# Patient Record
Sex: Female | Born: 1940
Health system: Southern US, Community
[De-identification: ages and names within clinical notes are randomized; demographics above are authoritative.]

## PROBLEM LIST (undated history)

## (undated) DIAGNOSIS — K5730 Diverticulosis of large intestine without perforation or abscess without bleeding: Secondary | ICD-10-CM

## (undated) DIAGNOSIS — E785 Hyperlipidemia, unspecified: Secondary | ICD-10-CM

## (undated) DIAGNOSIS — I714 Abdominal aortic aneurysm, without rupture, unspecified: Secondary | ICD-10-CM

## (undated) DIAGNOSIS — Z8551 Personal history of malignant neoplasm of bladder: Secondary | ICD-10-CM

## (undated) DIAGNOSIS — I10 Essential (primary) hypertension: Secondary | ICD-10-CM

## (undated) DIAGNOSIS — I2510 Atherosclerotic heart disease of native coronary artery without angina pectoris: Secondary | ICD-10-CM

## (undated) DIAGNOSIS — R351 Nocturia: Secondary | ICD-10-CM

## (undated) DIAGNOSIS — C801 Malignant (primary) neoplasm, unspecified: Secondary | ICD-10-CM

## (undated) DIAGNOSIS — M8580 Other specified disorders of bone density and structure, unspecified site: Secondary | ICD-10-CM

## (undated) DIAGNOSIS — M1990 Unspecified osteoarthritis, unspecified site: Secondary | ICD-10-CM

## (undated) HISTORY — PX: CHOLECYSTECTOMY: SHX55

## (undated) HISTORY — DX: Essential (primary) hypertension: I10

## (undated) HISTORY — DX: Atherosclerotic heart disease of native coronary artery without angina pectoris: I25.10

## (undated) HISTORY — DX: Malignant (primary) neoplasm, unspecified: C80.1

## (undated) HISTORY — DX: Other specified disorders of bone density and structure, unspecified site: M85.80

## (undated) HISTORY — DX: Hyperlipidemia, unspecified: E78.5

## (undated) HISTORY — PX: UMBILICAL HERNIA REPAIR: SHX196

---

## 1991-03-11 DIAGNOSIS — I10 Essential (primary) hypertension: Secondary | ICD-10-CM

## 1991-03-11 DIAGNOSIS — E785 Hyperlipidemia, unspecified: Secondary | ICD-10-CM

## 1991-03-11 HISTORY — DX: Hyperlipidemia, unspecified: E78.5

## 1991-03-11 HISTORY — PX: CORONARY ANGIOPLASTY: SHX604

## 1991-03-11 HISTORY — DX: Essential (primary) hypertension: I10

## 1998-04-15 ENCOUNTER — Encounter: Payer: Self-pay | Admitting: Surgery

## 1998-04-15 ENCOUNTER — Inpatient Hospital Stay (HOSPITAL_COMMUNITY): Admission: EM | Admit: 1998-04-15 | Discharge: 1998-04-17 | Payer: Self-pay | Admitting: Emergency Medicine

## 1998-04-16 ENCOUNTER — Encounter: Payer: Self-pay | Admitting: General Surgery

## 1998-09-28 ENCOUNTER — Other Ambulatory Visit: Admission: RE | Admit: 1998-09-28 | Discharge: 1998-09-28 | Payer: Self-pay | Admitting: Family Medicine

## 1999-10-09 ENCOUNTER — Other Ambulatory Visit: Admission: RE | Admit: 1999-10-09 | Discharge: 1999-10-09 | Payer: Self-pay | Admitting: Family Medicine

## 2000-01-07 ENCOUNTER — Encounter: Admission: RE | Admit: 2000-01-07 | Discharge: 2000-01-07 | Payer: Self-pay | Admitting: Family Medicine

## 2000-01-07 ENCOUNTER — Encounter: Payer: Self-pay | Admitting: Family Medicine

## 2000-10-14 ENCOUNTER — Other Ambulatory Visit: Admission: RE | Admit: 2000-10-14 | Discharge: 2000-10-14 | Payer: Self-pay | Admitting: Family Medicine

## 2001-10-13 ENCOUNTER — Encounter: Payer: Self-pay | Admitting: Family Medicine

## 2001-10-13 ENCOUNTER — Encounter: Admission: RE | Admit: 2001-10-13 | Discharge: 2001-10-13 | Payer: Self-pay | Admitting: Family Medicine

## 2002-01-14 ENCOUNTER — Other Ambulatory Visit: Admission: RE | Admit: 2002-01-14 | Discharge: 2002-01-14 | Payer: Self-pay | Admitting: Family Medicine

## 2002-12-13 ENCOUNTER — Encounter: Payer: Self-pay | Admitting: Family Medicine

## 2002-12-13 ENCOUNTER — Encounter: Admission: RE | Admit: 2002-12-13 | Discharge: 2002-12-13 | Payer: Self-pay | Admitting: Family Medicine

## 2003-03-17 ENCOUNTER — Other Ambulatory Visit: Admission: RE | Admit: 2003-03-17 | Discharge: 2003-03-17 | Payer: Self-pay | Admitting: Family Medicine

## 2003-05-31 ENCOUNTER — Ambulatory Visit (HOSPITAL_COMMUNITY): Admission: RE | Admit: 2003-05-31 | Discharge: 2003-05-31 | Payer: Self-pay | Admitting: Gastroenterology

## 2003-05-31 HISTORY — PX: COLONOSCOPY: SHX174

## 2004-04-23 ENCOUNTER — Encounter: Admission: RE | Admit: 2004-04-23 | Discharge: 2004-04-23 | Payer: Self-pay | Admitting: Family Medicine

## 2005-05-08 ENCOUNTER — Encounter: Admission: RE | Admit: 2005-05-08 | Discharge: 2005-05-08 | Payer: Self-pay | Admitting: Family Medicine

## 2005-09-12 ENCOUNTER — Other Ambulatory Visit: Admission: RE | Admit: 2005-09-12 | Discharge: 2005-09-12 | Payer: Self-pay | Admitting: Family Medicine

## 2006-06-03 ENCOUNTER — Encounter: Admission: RE | Admit: 2006-06-03 | Discharge: 2006-06-03 | Payer: Self-pay | Admitting: Family Medicine

## 2007-06-22 ENCOUNTER — Encounter: Admission: RE | Admit: 2007-06-22 | Discharge: 2007-06-22 | Payer: Self-pay | Admitting: Family Medicine

## 2008-05-10 ENCOUNTER — Encounter: Payer: Self-pay | Admitting: Cardiology

## 2008-08-03 ENCOUNTER — Encounter: Admission: RE | Admit: 2008-08-03 | Discharge: 2008-08-03 | Payer: Self-pay | Admitting: Family Medicine

## 2008-08-23 ENCOUNTER — Encounter: Payer: Self-pay | Admitting: Cardiology

## 2008-11-15 ENCOUNTER — Encounter: Payer: Self-pay | Admitting: Cardiology

## 2008-11-28 ENCOUNTER — Ambulatory Visit: Payer: Self-pay | Admitting: Cardiology

## 2008-11-28 DIAGNOSIS — E785 Hyperlipidemia, unspecified: Secondary | ICD-10-CM

## 2008-11-28 DIAGNOSIS — I2510 Atherosclerotic heart disease of native coronary artery without angina pectoris: Secondary | ICD-10-CM | POA: Insufficient documentation

## 2008-11-28 DIAGNOSIS — I10 Essential (primary) hypertension: Secondary | ICD-10-CM | POA: Insufficient documentation

## 2008-11-28 HISTORY — DX: Hyperlipidemia, unspecified: E78.5

## 2008-11-28 HISTORY — DX: Atherosclerotic heart disease of native coronary artery without angina pectoris: I25.10

## 2009-09-11 ENCOUNTER — Encounter: Admission: RE | Admit: 2009-09-11 | Discharge: 2009-09-11 | Payer: Self-pay | Admitting: Family Medicine

## 2010-01-02 ENCOUNTER — Ambulatory Visit: Payer: Self-pay | Admitting: Cardiology

## 2010-02-25 ENCOUNTER — Encounter: Payer: Self-pay | Admitting: Cardiology

## 2010-04-09 NOTE — Assessment & Plan Note (Signed)
Summary: [facility]   Visit Type:  Follow-up Primary Provider:  Dr. Vernon Prey  CC:  CAD/HTN.  History of Present Illness: The patient returns for followup of the above. Since I last saw her she says that her blood pressure does fluctuate somewhat. She reports a going from the 130s to the 150 range systolic. She does not describe any symptoms associated with this. She tries to be active golfing and doing some housework as well as pedaling a stationary bicycle. With this she does not have any chest discomfort, neck or arm discomfort. She does not report any palpitations, presyncope or syncope. She has no shortness of breath, PND or orthopnea. I did review recent lipids and her LDL is elevated but she has only tolerated a very low dose statin.  Current Medications (verified): 1)  Metoprolol Tartrate 25 Mg Tabs (Metoprolol Tartrate) .... One Twice A Day 2)  Vitamin D 2000 Unit Tabs (Cholecalciferol) .... Daily 3)  Co Q-10 30 Mg  Caps (Coenzyme Q10) .... Daily 4)  Pravachol 10 Mg Tabs (Pravastatin Sodium) .... Out 5)  Multivitamins   Tabs (Multiple Vitamin) .[name].. 1 By Mouth Daily  Allergies (verified): 1)  ! Codeine  Past History:  Past Medical History: HTN since 1993 Hyperlipidemia since 1993 CAD (1993 PTCA. She describes a near total blockage of apparently the LAD and a 65% stenosis elsewhere.)  Review of Systems       As stated in the HPI and negative for all other systems.   Vital Signs:  Patient profile:   70 year old female Height:      64 inches Weight:      168 pounds BMI:     28.94 Pulse rate:   71 / minute Resp:     18 per minute BP sitting:   162 / 88  (right arm)  Vitals Entered By: Marrion Coy, CNA (January 02, 2010 9:30 AM)  Physical Exam  General:  Well developed, well nourished, in no acute distress. Head:  normocephalic and atraumatic Neck:  Neck supple, no JVD. No masses, thyromegaly or abnormal cervical nodes. Chest Wall:  no deformities or  breast masses noted Lungs:  Clear bilaterally to auscultation and percussion. Abdomen:  Bowel sounds positive; abdomen soft and non-tender without masses, organomegaly, or hernias noted. No hepatosplenomegaly. Msk:  Back normal, normal gait. Muscle strength and tone normal. Extremities:  No clubbing or cyanosis. Neurologic:  Alert and oriented x 3. Skin:  Intact without lesions or rashes. Cervical Nodes:  no significant adenopathy Inguinal Nodes:  no significant adenopathy Psych:  Normal affect.   Detailed Cardiovascular Exam  Neck    Carotids: Carotids full and equal bilaterally without bruits.      Neck Veins: Normal, no JVD.    Heart    Inspection: no deformities or lifts noted.      Palpation: normal PMI with no thrills palpable.      Auscultation: regular rate and rhythm, S1, S2 without murmurs, rubs, gallops, or clicks.    Vascular    Abdominal Aorta: no palpable masses, pulsations, or audible bruits.      Femoral Pulses: normal femoral pulses bilaterally.      Pedal Pulses: normal pedal pulses bilaterally.      Radial Pulses: normal radial pulses bilaterally.      Peripheral Circulation: no clubbing, cyanosis, or edema noted with normal capillary refill.     EKG  Procedure date:  01/02/2010  Findings:      Sinus rhythm, rate  72, axis within normal limits, intervals within normal limits, no acute ST-T wave changes  Impression & Recommendations:  Problem # 1:  CORONARY ATHEROSCLEROSIS NATIVE CORONARY ARTERY (ICD-414.01) It has been many years since stress testing. I would like to try an exercise treadmill test.  She will also continue with risk reduction. Orders: EKG w/ Interpretation (93000)  Problem # 2:  DYSLIPIDEMIA (ICD-272.4) Unfortunately she has not tolerated higher dose statin dose she is not at all at target with her lipids. We sent a long time discussing dietary supplements and over-the-counter treatments as well as foods that could help with her  lipids.  Problem # 3:  ESSENTIAL HYPERTENSION, BENIGN (ICD-401.1) Her blood pressure is not well controlled. I will have her increase her metoprolol to 25 mg b.i.d. Orders: EKG w/ Interpretation (93000)  Patient Instructions: 1)  Your physician recommends that you schedule a follow-up appointment in: 12 months with Dr Antoine Poche in [place] 2)  Your physician has recommended you make the following change in your medication: Increase Metoprolol to 25 mg twice a day 3)  Your physician has requested that you have an exercise tolerance test. This should be scheduled at [facility].  For further information please visit [email or web address]  Please also follow instruction sheet, as given. Prescriptions: METOPROLOL TARTRATE 25 MG TABS (METOPROLOL TARTRATE) one twice a day  #60 x 11   Entered by:   Charolotte Capuchin, RN   Authorized by:   Rollene Rotunda, MD, [facility]   Signed by:   Charolotte Capuchin, RN on 01/02/2010   Method used:   Faxed to ...       [facility]ail[facility]       [address]       [place], [place]  [address]       Ph: [phone] or [phone]       Fax: [phone]   RxID:   [id number]

## 2010-04-09 NOTE — Letter (Signed)
Summary: [facility]                            [facility]                                     Imported By: Roderic Ovens 12/13/2008 14:57:18  _____________________________________________________________________  External Attachment:    Type:   Image     Comment:   External Document

## 2010-04-09 NOTE — Letter (Signed)
Summary: [facility]  [facility]   Imported By: Roderic Ovens 12/13/2008 15:01:09  _____________________________________________________________________  External Attachment:    Type:   Image     Comment:   External Document

## 2010-04-09 NOTE — Miscellaneous (Signed)
  Clinical Lists Changes  Medications: Changed medication from PRAVACHOL 10 MG TABS (PRAVASTATIN SODIUM) OUT to PRAVACHOL 10 MG TABS (PRAVASTATIN SODIUM) once daily at bedtime

## 2010-04-09 NOTE — Assessment & Plan Note (Signed)
Summary: np6/cad   Visit Type:  Follow-up Primary Provider:  Dr. Vernon Prey   History of Present Illness: The patient presents for evaluation of known coronary disease.  In 1993 she had dyspnea on exertion and apparently subsequently failed a stress perfusion study with evidence of ischemia. She then underwent PTCA. She describes a near total blockage of apparently the LAD and a 65% stenosis elsewhere. She was followed with stress test with the last one being about 7 years ago. However, she is no longer followed by a cardiologist.  Since that time she has done quite well. She exercises daily. She walks for exercise. With this she does not get chest discomfort, neck or arm discomfort. She does not have any of the shortness of breath which was her previous angina. She denies any resting shortness of breath such as PND or orthopnea. There is no report of palpitations, presyncope or syncope.  She has tried multiple statins in the past. She has tolerated none of them. She also reports whitecoat hypertension.  Current Medications (verified): 1)  Metoprolol Succinate 25 Mg Xr24h-Tab (Metoprolol Succinate) .... Dailiy 2)  Vitamin D 2000 Unit Tabs (Cholecalciferol) .... Daily 3)  Aspirin 81 Mg  Tabs (Aspirin) .... Daily 4)  Fish Oil   Oil (Fish Oil) .... 2 By Mouth Daily 5)  Co Q-10 30 Mg  Caps (Coenzyme Q10) .... Daily 6)  Pravachol 10 Mg Tabs (Pravastatin Sodium) .... One By Mouth At Bedtime  Allergies (verified): 1)  ! Codeine  Past History:  Past Medical History: HTN since 1993 Hyperlipidemia since 1993 CAD  Past Surgical History: Umbilical hernia repair Cholecystectomy  Family History: Her father had coronary disease starting in his late 98s. He died at age 18. Otherwise there is no early onset heart disease in first-degree relatives.  Social History: She smoked briefly years ago. She occasionally drinks alcohol. She is married with 3 children and 4 grandchildren.  Review of  Systems       As stated in the HPI and negative for all other systems.   Vital Signs:  Patient profile:   70 year old female Height:      64 inches Weight:      170 pounds BMI:     29.29 Pulse rate:   73 / minute Resp:     16 per minute BP sitting:   177 / 93  (right arm)  Vitals Entered By: Marrion Coy, CNA (November 28, 2008 9:47 AM)  Physical Exam  General:  Well developed, well nourished, in no acute distress. Head:  normocephalic and atraumatic Eyes:  PERRLA/EOM intact; conjunctiva and lids normal. Mouth:  Teeth, gums and palate normal. Oral mucosa normal. Neck:  Neck supple, no JVD. No masses, thyromegaly or abnormal cervical nodes. Chest Wall:  no deformities or breast masses noted Lungs:  Clear bilaterally to auscultation and percussion. Abdomen:  Bowel sounds positive; abdomen soft and non-tender without masses, organomegaly, or hernias noted. No hepatosplenomegaly. Msk:  Back normal, normal gait. Muscle strength and tone normal. Extremities:  No clubbing or cyanosis. Neurologic:  Alert and oriented x 3. Skin:  Intact without lesions or rashes. Cervical Nodes:  no significant adenopathy Axillary Nodes:  no significant adenopathy Inguinal Nodes:  no significant adenopathy Psych:  Normal affect.   Detailed Cardiovascular Exam  Neck    Carotids: Carotids full and equal bilaterally without bruits.      Neck Veins: Normal, no JVD.    Heart    Inspection: no deformities or  lifts noted.      Palpation: normal PMI with no thrills palpable.      Auscultation: regular rate and rhythm, S1, S2 without murmurs, rubs, gallops, or clicks.    Vascular    Abdominal Aorta: no palpable masses, pulsations, or audible bruits.      Femoral Pulses: normal femoral pulses bilaterally.      Pedal Pulses: normal pedal pulses bilaterally.      Radial Pulses: normal radial pulses bilaterally.      Peripheral Circulation: no clubbing, cyanosis, or edema noted with normal capillary  refill.     EKG  Procedure date:  11/28/2008  Findings:      Normal sinus rhythm, rate 73, axis within normal limits, and her vitals within normal limits, no acute ST-T wave changes.  Impression & Recommendations:  Problem # 1:  CORONARY ATHEROSCLEROSIS NATIVE CORONARY ARTERY (ICD-414.01) The patient has no ongoing symptoms. She is quite active exercising daily. At this point I would continue with risk reduction but would not suggest further cardiovascular testing. She does know that should she develop any symptoms that could be an anginal equivalent, we reviewed these, she would let me know because I would have a low threshold for stress testing going forward. Orders: EKG w/ Interpretation (93000)  Problem # 2:  DYSLIPIDEMIA (ICD-272.4) I do not have the report of her lipids but apparently this has been somewhat abnormal in the past. Given her coronary disease Dr. Christell Constant has appropriately tried to have her on a statin over the years. She does agree to try pravastatin again as it has been 15 years since she last attempted this. She'll let me know if she has any of the muscle aches or other symptoms that have been her complaint on these drugs. If she tolerates it she will get a fasting lipid profile in 8-10 weeks. Titration will be per Dr. Christell Constant.  Problem # 3:  ESSENTIAL HYPERTENSION, BENIGN (ICD-401.1) The patient's blood pressure was too elevated today. However, she reports that at home it is under 140/90 consistently. She reports whitecoat hypertension and I will believe this. We did clarify her beta blocker as she has started taking an immediate release preparation but was only taking it once daily.  Patient Instructions: 1)  Your physician recommends that you schedule a follow-up appointment in: one year 2)  Your physician has recommended you make the following change in your medication: start Pravastatin 10 mg at bedtime Prescriptions: PRAVACHOL 10 MG TABS (PRAVASTATIN SODIUM) one by  mouth at bedtime  #30 x 11   Entered by:   Charolotte Capuchin, RN   Authorized by:   Rollene Rotunda, MD, [facility]   Signed by:   Charolotte Capuchin, RN on 11/28/2008   Method used:   Faxed to ...       Hospital doctor (retail)       [address]       [place], [place]  [address]       Ph: [phone] or [phone]       Fax: [phone]   RxID:   [id number]

## 2010-07-02 ENCOUNTER — Encounter: Payer: Self-pay | Admitting: Cardiology

## 2010-07-26 NOTE — Op Note (Signed)
NAME:  Gabriela Smith, Gabriela Smith                          ACCOUNT NO.:  [id number]   MEDICAL RECORD NO.:  [id number]                   PATIENT TYPE:  AMB   LOCATION:  ENDO                                 FACILITY:  [facility]   PHYSICIAN:  John C. Madilyn Fireman, M.D.                 DATE OF BIRTH:  09-21-1940   DATE OF PROCEDURE:  05/31/2003  DATE OF DISCHARGE:                                 OPERATIVE REPORT   PROCEDURE:  Colonoscopy.   INDICATION FOR PROCEDURE:  Colon cancer screening.   DESCRIPTION OF PROCEDURE:  The patient was placed in the left lateral  decubitus position and placed on the pulse monitor with continuous low-flow  oxygen delivered by nasal cannula.  She was sedated with 125 mcg IV fentanyl  and 12 mg IV Versed.  The Olympus video colonoscope was inserted into the  rectum and advanced to approximately 80 cm, estimated to be somewhere in the  transverse colon.  There was some angulation and looping, and the patient  did not sedate very much at all despite 125 mcg IV fentanyl, 12 mg IV  Versed, and 25 mg IV fentanyl.  Even when not subject to pain stimuli, she  was not very sedated and did experience considerable discomfort if areas  there was angulation and increased resistance when trying to pass the scope  forward.  I could not easily overcome this and was reluctant to give her  more medication given that she was very minimally sedated at all despite the  above-mentioned amounts.  I decided at this point to limit the procedure to  the areas already reached, and it was estimated that this was somewhere in  the proximal transverse colon down to the anus.  Mucosa of the transverse  and descending colon appeared normal.  There were numerous sigmoid  diverticula and no other abnormalities of the sigmoid.  The rectum appeared  normal, and retroflexed view of the anus revealed no obvious internal  hemorrhoids.  The scope was then withdrawn and the patient returned to the  recovery room in  stable condition.  She tolerated the procedure well, and  there were no immediate complications.   IMPRESSION:  1. Sigmoid diverticulosis.  2. Otherwise, normal study, limited to the transverse colon due to     angulation and inability to adequately sedate the patient.   PLAN:  We will follow up with a barium enema.                                               John C. Madilyn Fireman, M.D.    J[facility]/MEDQ  D:  05/31/2003  T:  06/01/2003  Job:  [id number]   cc:   Ernestina Penna, M.D.  [address] [place]  [place] [address]  Fax: [phone]

## 2010-10-02 ENCOUNTER — Other Ambulatory Visit: Payer: Self-pay | Admitting: Family Medicine

## 2010-10-02 DIAGNOSIS — Z1231 Encounter for screening mammogram for malignant neoplasm of breast: Secondary | ICD-10-CM

## 2010-10-15 ENCOUNTER — Encounter: Payer: Self-pay | Admitting: Cardiology

## 2010-10-30 ENCOUNTER — Ambulatory Visit: Payer: Self-pay

## 2010-11-13 ENCOUNTER — Ambulatory Visit
Admission: RE | Admit: 2010-11-13 | Discharge: 2010-11-13 | Disposition: A | Discharge status: Home / Self Care | Payer: Medicare Other | Source: Ambulatory Visit | Attending: Family Medicine | Admitting: Family Medicine

## 2010-11-13 DIAGNOSIS — Z1231 Encounter for screening mammogram for malignant neoplasm of breast: Secondary | ICD-10-CM

## 2011-02-11 ENCOUNTER — Other Ambulatory Visit: Payer: Self-pay | Admitting: Urology

## 2011-03-10 ENCOUNTER — Encounter (HOSPITAL_BASED_OUTPATIENT_CLINIC_OR_DEPARTMENT_OTHER): Payer: Self-pay | Admitting: *Deleted

## 2011-03-10 NOTE — Progress Notes (Signed)
NPO AFTER MN. ARRIVES AT 0845. NEEDS ISTAT AND EKG. WILL TAKE LOPRESSOR AM OF SURG. W/ SIP OF WATER.

## 2011-03-13 NOTE — H&P (Signed)
History of Present Illness         Gabriela Smith is a 71 year old female patient of Paulene Floor who was seen in office consultation for further evaluation of possible chronic cystitis. She has been found on multiple occasions to have pyuria and hematuria on her urinalyses. She reports she has never had a UTI in her life but starting about 3 months ago she began to have severe dysuria and pain that she described shooting down both her arms and legs with urination. This was not associated with any hematuria. She was seen and evaluated and placed on empiric Cipro and then switched to Macrodantin but she developed a rash from that. This is associated with some low back pain. She thought that a recent switch to a different bubblebath insult may have precipitated some of the symptoms so she's avoided using these for the past month however she continues to have intermittent symptoms. They're moderate in severity.  She also reported to me that for about the past year anytime she takes a nonsteroidal anti-inflammatory medication she will notice gross hematuria. When she stops taking the medication her gross hematuria will resolve.   Past Medical History Problems  1. History of  Arthritis V13.4 2. History of  Heart Disease 429.9 3. History of  Hypercholesterolemia 272.0 4. History of  Hypertension 401.9  Surgical History Problems  1. History of  Cholecystectomy 2. History of  Previous Balloon Angioplasty 3. History of  Umbilical Hernia Repair  Current Meds 1. Calcium + D TABS; Therapy: (Recorded:13Nov2012) to 2. Crestor 5 MG Oral Tablet; Therapy: 30Jan2012 to 3. Flaxseed Oil CAPS; Therapy: (Recorded:13Nov2012) to 4. Metoprolol Tartrate 25 MG Oral Tablet; Therapy: 26Jun2012 to 5. Multi-Day TABS; Therapy: (Recorded:13Nov2012) to 6. Vitamin B Complex Oral Capsule; Therapy: (Recorded:13Nov2012) to  Allergies Medication  1. Codeine Derivatives 2. Nitrofurantoin CAPS 3. Ibuprofen TABS  Family  History Problems  1. Family history of  Death In The Family Father 2. Family history of  Death In The Family Mother 3. Family history of  Family Health Status Number Of Children 4. Family history of  Heart Disease V17.49 5. Family history of  Hypertension V17.49  Social History Problems    Alcohol Use   Marital History - Currently Married   Never A Smoker   Retired From Work Denied    History of  Caffeine Use  Review of Systems Genitourinary, constitutional, skin, eye, otolaryngeal, hematologic/lymphatic, cardiovascular, pulmonary, endocrine, musculoskeletal, gastrointestinal, neurological and psychiatric system(s) were reviewed and pertinent findings if present are noted.  Genitourinary: urinary frequency, urinary urgency, dysuria, nocturia and urinary stream starts and stops.  Gastrointestinal: heartburn.  Constitutional: feeling tired (fatigue).  Integumentary: skin rash/lesion and pruritus.  Musculoskeletal: back pain and joint pain.    Vitals Vital Signs BMI Calculated: 28.17 BSA Calculated: 1.81 Height: 5 ft 4 in Weight: 165 lb  Blood Pressure: 160 / 79 Temperature: 98.4 F Heart Rate: 72  Physical Exam Constitutional: Well nourished and well developed . No acute distress.  ENT:. The ears and nose are normal in appearance.  Neck: The appearance of the neck is normal and no neck mass is present.  Pulmonary: No respiratory distress and normal respiratory rhythm and effort.  Cardiovascular: Heart rate and rhythm are normal . No peripheral edema.  Abdomen: The abdomen is soft and nontender. No masses are palpated. No CVA tenderness. No hernias are palpable. No hepatosplenomegaly noted.  Genitourinary: Examination of the external genitalia shows normal female external genitalia and no lesions. The urethra  is normal in appearance and not tender. There is no urethral mass. Vaginal exam demonstrates no abnormalities. The adnexa are palpably normal. The bladder is non  tender and not distended. The anus is normal on inspection. The perineum is normal on inspection.  Lymphatics: The femoral and inguinal nodes are not enlarged or tender.  Skin: Normal skin turgor, no visible rash and no visible skin lesions.  Neuro/Psych:. Mood and affect are appropriate.    Results/Data Urine COLOR: YELLOW  Reference Range YELLOW APPEARANCE: CLEAR  Reference Range CLEAR SPECIFIC GRAVITY: 1.025  Reference Range 1.005-1.030 pH: 5.5  Reference Range 5.0-8.0 GLUCOSE: NEG mg/dL Reference Range NEG BILIRUBIN: NEG  Reference Range NEG KETONE: NEG mg/dL Reference Range NEG BLOOD: LARGE  Abnormal Reference Range NEG PROTEIN: 100 mg/dL Abnormal Reference Range NEG UROBILINOGEN: 0.2 mg/dL Reference Range 3.0-8.6 NITRITE: NEG  Reference Range NEG LEUKOCYTE ESTERASE: TRACE  Abnormal Reference Range NEG SQUAMOUS EPITHELIAL/HPF: RARE  Reference Range RARE WBC: 4-6 WBC/hpf Abnormal Reference Range <4 RBC: TNTC RBC/hpf Abnormal Reference Range <4 BACTERIA: RARE  Reference Range RARE CRYSTALS: NONE SEEN  Reference Range NEG CASTS: NONE SEEN  Reference Range NEG  The following images/tracing/specimen were independently visualized:  CT as below.  The following clinical lab reports were reviewed:  Creatinine as below.  The following radiology reports were reviewed: CT as below. Selected Results  AU CT-HEMATURIA PROTOCOL 15Nov2012 12:00AM Ihor Gully   Test Name Result Flag Reference  ** RADIOLOGY REPORT BY [facility] ** ORIGINAL APPROVED BY: P. Loralie Champagne, M.D. ON: 01/23/2011 15:27:57   *RADIOLOGY REPORT*  Clinical Data: Microscopic hematuria.  CT ABDOMEN AND PELVIS WITHOUT AND WITH CONTRAST  Technique: Multidetector CT imaging of the abdomen and pelvis was performed without contrast material in one or both body regions, followed by contrast material(s) and further sections in one or both body regions.  Contrast: 125 ml Isovue 300  Comparison:  None  Findings: The lung bases are clear. Minimal dependent atelectasis. The heart is borderline enlarged. Coronary artery calcifications are noted.  The liver demonstrates mild diffuse fatty infiltration. No focal hepatic lesions or intrahepatic biliary dilatation. The gallbladder is surgically absent. No common bile duct dilatation. The pancreas is normal. The spleen is normal in size. No focal lesions. The adrenal glands and kidneys are normal. No renal calculi, renal mass lesions or collecting system abnormality. The ureters are normal. No ureteral or bladder calculi.  There is a large irregular enhancing mass in the bladder measuring 6.8 x 5.2 cm. No obstruction of the right ureter but there is certainly a risk for such given the size and location of the tumor. Rim calcifications are noted. The uterus and ovaries are unremarkable. No pelvic lymphadenopathy. No inguinal adenopathy. No evidence of extension of the bladder mass outside the bladder.  The stomach, duodenum, small bowel and colon are unremarkable without oral contrast. No mass lesions or inflammatory changes. The appendix is normal. No mesenteric or retroperitoneal masses or lymphadenopathy. Small scattered lymph nodes are noted. There are moderate atherosclerotic changes involving the aorta and branch vessel ostia. No focal aneurysm or dissection.  The bony structures are intact. No findings for metastatic bone disease.  IMPRESSION:  1. 6.8 x 5.2 cm enhancing right-sided bladder mass consistent with uroepithelial neoplasm. 2. No pelvic lymphadenopathy or evidence of metastatic disease. 3. Normal CT appearance of the kidneys and ureters.   BUN & CREATININE 13Nov2012 09:51AM Ihor Gully  SPECIMEN TYPE: BLOOD   Test Name Result Flag Reference  CREATININE  0.77 mg/dL  1.61-0.96  BUN 15 mg/dL  0-45  Est GFR, African American 89 mL/min/1.79m2    Est GFR, NonAfrican American 77 mL/min/1.85m2    The estimated  GFR is a calculation valid for adults (78 to 73 years old) that uses the CKD-EPI algorithm to adjust for age and sex. It is not to be used for children, pregnant women, hospitalized patients, patients on dialysis, or with rapidly changing kidney function. According to the NKDEP, eGFR >89 is normal, 60-89 shows mild impairment, 30-59 shows moderate impairment, 15-29 shows severe impairment and <15 is ESRD.   Procedure  Procedure: Cystoscopy  Informed Consent: Risks, benefits, and potential adverse events were discussed and informed consent was obtained from the patient.  Prep: The patient was prepped with hibiclens.  Procedure Note:  Urethral meatus:. No abnormalities.  Anterior urethra: No abnormalities.  Bladder: Visulization was clear. The ureteral orifices were in the normal anatomic position bilaterally and had clear efflux of urine. A systematic survey of the bladder demonstrated no bladder tumors or stones. The mucosa was smooth without abnormalities. A solitary tumor was visualized in the bladder. A papillary tumor was seen in the bladder. This tumor was located on the right side, on the anterior aspect of the bladder. The patient tolerated the procedure well.  Complications: None.    Assessment Assessed  1. Transitional Cell Carcinoma Of The Bladder 188.9 2. Gross Hematuria 599.71 3. Pyuria 791.9       I went over the results of her CT scan which has revealed no abnormality of the upper tract however she did have an enhancing lesion in the bladder which was found cystoscopically to be a bladder tumor most consistent with transitional cell carcinoma. It is located on the right anterior wall is quite large extending down to near the ureteral orifice which I could not identify easily today but it does not appear to be involving the orifice itself. I went over the fact that this needs to be surgically excised and evaluated pathologically in order to determine the stage and grade of the  lesion in order to determine what form of further therapy, if any, will be necessary. I went over the procedure of transurethral resection of her bladder tumor with her in detail including its risks and complications. We also discussed the probability of success with this procedure and she has elected to proceed.   Plan Microscopic Hematuria (599.72), Transitional Cell Carcinoma Of The Bladder (188.9), Gross Hematuria (599.71)  1. Follow-up Schedule Surgery Office  Follow-up  Requested for: 03Dec2012   1. She has been scheduled for transurethral resection of her bladder tumor. 2. I will plan to proceed with mitomycin-C intravesically postoperatively.

## 2011-03-14 ENCOUNTER — Encounter (HOSPITAL_BASED_OUTPATIENT_CLINIC_OR_DEPARTMENT_OTHER)
Admission: RE | Disposition: A | Discharge status: Home / Self Care | Payer: Self-pay | Source: Ambulatory Visit | Attending: Urology

## 2011-03-14 ENCOUNTER — Encounter (HOSPITAL_BASED_OUTPATIENT_CLINIC_OR_DEPARTMENT_OTHER): Payer: Self-pay | Admitting: Anesthesiology

## 2011-03-14 ENCOUNTER — Encounter (HOSPITAL_BASED_OUTPATIENT_CLINIC_OR_DEPARTMENT_OTHER): Payer: Self-pay | Admitting: *Deleted

## 2011-03-14 ENCOUNTER — Ambulatory Visit (HOSPITAL_BASED_OUTPATIENT_CLINIC_OR_DEPARTMENT_OTHER)
Admission: RE | Admit: 2011-03-14 | Discharge: 2011-03-14 | Disposition: A | Discharge status: Home / Self Care | Payer: Medicare Other | Source: Ambulatory Visit | Attending: Urology | Admitting: Urology

## 2011-03-14 ENCOUNTER — Other Ambulatory Visit: Payer: Self-pay

## 2011-03-14 ENCOUNTER — Ambulatory Visit (HOSPITAL_BASED_OUTPATIENT_CLINIC_OR_DEPARTMENT_OTHER): Payer: Medicare Other | Admitting: Anesthesiology

## 2011-03-14 ENCOUNTER — Other Ambulatory Visit: Payer: Self-pay | Admitting: Urology

## 2011-03-14 DIAGNOSIS — C679 Malignant neoplasm of bladder, unspecified: Secondary | ICD-10-CM

## 2011-03-14 DIAGNOSIS — C672 Malignant neoplasm of lateral wall of bladder: Secondary | ICD-10-CM | POA: Insufficient documentation

## 2011-03-14 DIAGNOSIS — I10 Essential (primary) hypertension: Secondary | ICD-10-CM | POA: Insufficient documentation

## 2011-03-14 DIAGNOSIS — C674 Malignant neoplasm of posterior wall of bladder: Secondary | ICD-10-CM | POA: Insufficient documentation

## 2011-03-14 DIAGNOSIS — E7800 Pure hypercholesterolemia, unspecified: Secondary | ICD-10-CM | POA: Insufficient documentation

## 2011-03-14 DIAGNOSIS — R82998 Other abnormal findings in urine: Secondary | ICD-10-CM | POA: Insufficient documentation

## 2011-03-14 DIAGNOSIS — R310 Gross hematuria: Secondary | ICD-10-CM | POA: Insufficient documentation

## 2011-03-14 DIAGNOSIS — Z79899 Other long term (current) drug therapy: Secondary | ICD-10-CM | POA: Insufficient documentation

## 2011-03-14 HISTORY — DX: Nocturia: R35.1

## 2011-03-14 HISTORY — PX: TRANSURETHRAL RESECTION OF BLADDER TUMOR: SHX2575

## 2011-03-14 SURGERY — TURBT (TRANSURETHRAL RESECTION OF BLADDER TUMOR)
Anesthesia: General | Site: Bladder | Wound class: Clean Contaminated

## 2011-03-14 MED ORDER — ROCURONIUM BROMIDE 100 MG/10ML IV SOLN
INTRAVENOUS | Status: DC | PRN
  Administered 2011-03-14: 10 mg via INTRAVENOUS
  Administered 2011-03-14: 5 mg via INTRAVENOUS

## 2011-03-14 MED ORDER — PHENAZOPYRIDINE HCL 200 MG PO TABS
200.0000 mg | ORAL_TABLET | Freq: Once | ORAL | Status: AC
  Administered 2011-03-14: 200 mg via ORAL

## 2011-03-14 MED ORDER — LIDOCAINE HCL (CARDIAC) 20 MG/ML IV SOLN
INTRAVENOUS | Status: DC | PRN
  Administered 2011-03-14: 60 mg via INTRAVENOUS

## 2011-03-14 MED ORDER — NEOSTIGMINE METHYLSULFATE 1 MG/ML IJ SOLN
INTRAMUSCULAR | Status: DC | PRN
  Administered 2011-03-14: 4 mg via INTRAVENOUS

## 2011-03-14 MED ORDER — PROPOFOL 10 MG/ML IV EMUL
INTRAVENOUS | Status: DC | PRN
  Administered 2011-03-14: 200 mg via INTRAVENOUS

## 2011-03-14 MED ORDER — STERILE WATER FOR IRRIGATION IR SOLN
Status: DC | PRN
  Administered 2011-03-14: 500 mL

## 2011-03-14 MED ORDER — SODIUM CHLORIDE 0.9 % IR SOLN
Status: DC | PRN
  Administered 2011-03-14: 30000 mL

## 2011-03-14 MED ORDER — ONDANSETRON HCL 4 MG/2ML IJ SOLN
INTRAMUSCULAR | Status: DC | PRN
  Administered 2011-03-14: 4 mg via INTRAVENOUS

## 2011-03-14 MED ORDER — GLYCOPYRROLATE 0.2 MG/ML IJ SOLN
INTRAMUSCULAR | Status: DC | PRN
  Administered 2011-03-14: .6 mg via INTRAVENOUS

## 2011-03-14 MED ORDER — MITOMYCIN CHEMO FOR BLADDER INSTILLATION 40 MG: 40.0000 mg | Freq: Once | INTRAVENOUS | Status: DC

## 2011-03-14 MED ORDER — CIPROFLOXACIN IN D5W 200 MG/100ML IV SOLN
200.0000 mg | INTRAVENOUS | Status: AC
  Administered 2011-03-14: 400 mg via INTRAVENOUS

## 2011-03-14 MED ORDER — LACTATED RINGERS IV SOLN
INTRAVENOUS | Status: DC
  Administered 2011-03-14 (×3): via INTRAVENOUS

## 2011-03-14 MED ORDER — SUCCINYLCHOLINE CHLORIDE 20 MG/ML IJ SOLN
INTRAMUSCULAR | Status: DC | PRN
  Administered 2011-03-14: 180 mg via INTRAVENOUS

## 2011-03-14 MED ORDER — 0.9 % SODIUM CHLORIDE (POUR BTL) OPTIME
TOPICAL | Status: DC | PRN
  Administered 2011-03-14: 500 mL

## 2011-03-14 MED ORDER — OXYBUTYNIN CHLORIDE 5 MG PO TABS
5.0000 mg | ORAL_TABLET | Freq: Once | ORAL | Status: AC
  Administered 2011-03-14: 5 mg via ORAL

## 2011-03-14 MED ORDER — FENTANYL CITRATE 0.05 MG/ML IJ SOLN: 25.0000 ug | INTRAMUSCULAR | Status: DC | PRN

## 2011-03-14 MED ORDER — FENTANYL CITRATE 0.05 MG/ML IJ SOLN
INTRAMUSCULAR | Status: DC | PRN
  Administered 2011-03-14: 25 ug via INTRAVENOUS
  Administered 2011-03-14: 50 ug via INTRAVENOUS
  Administered 2011-03-14: 25 ug via INTRAVENOUS
  Administered 2011-03-14 (×2): 50 ug via INTRAVENOUS

## 2011-03-14 MED ORDER — PHENAZOPYRIDINE HCL 200 MG PO TABS: 200.0000 mg | ORAL_TABLET | Freq: Three times a day (TID) | ORAL | Status: AC | PRN

## 2011-03-14 MED ORDER — EPHEDRINE SULFATE 50 MG/ML IJ SOLN
INTRAMUSCULAR | Status: DC | PRN
  Administered 2011-03-14: 30 mg via INTRAVENOUS

## 2011-03-14 MED ORDER — HYDROCODONE-ACETAMINOPHEN 10-300 MG PO TABS: 1.0000 | ORAL_TABLET | Freq: Four times a day (QID) | ORAL | Status: DC | PRN

## 2011-03-14 MED ORDER — DEXAMETHASONE SODIUM PHOSPHATE 4 MG/ML IJ SOLN
INTRAMUSCULAR | Status: DC | PRN
  Administered 2011-03-14: 8 mg via INTRAVENOUS

## 2011-03-14 MED ORDER — PROMETHAZINE HCL 25 MG/ML IJ SOLN
6.2500 mg | INTRAMUSCULAR | Status: DC | PRN
  Administered 2011-03-14: 6.25 mg via INTRAVENOUS

## 2011-03-14 MED ORDER — LACTATED RINGERS IV SOLN: INTRAVENOUS | Status: DC

## 2011-03-14 MED ORDER — MEPERIDINE HCL 25 MG/ML IJ SOLN: 6.2500 mg | INTRAMUSCULAR | Status: DC | PRN

## 2011-03-14 SURGICAL SUPPLY — 34 items
BAG DRAIN URO-CYSTO SKYTR STRL (DRAIN) ×2 IMPLANT
BAG DRN ANRFLXCHMBR STRAP LEK (BAG)
BAG DRN UROCATH (DRAIN) ×1
BAG URINE DRAINAGE (UROLOGICAL SUPPLIES) ×2 IMPLANT
BAG URINE LEG 19OZ MD ST LTX (BAG) IMPLANT
CANISTER SUCT LVC 12 LTR MEDI- (MISCELLANEOUS) IMPLANT
CATH FOLEY 2WAY SLVR  5CC 20FR (CATHETERS)
CATH FOLEY 2WAY SLVR  5CC 22FR (CATHETERS) ×1
CATH FOLEY 2WAY SLVR  5CC 24FR (CATHETERS) ×1
CATH FOLEY 2WAY SLVR 5CC 20FR (CATHETERS) IMPLANT
CATH FOLEY 2WAY SLVR 5CC 22FR (CATHETERS) ×1 IMPLANT
CATH FOLEY 2WAY SLVR 5CC 24FR (CATHETERS) ×1 IMPLANT
CLOTH BEACON ORANGE TIMEOUT ST (SAFETY) ×2 IMPLANT
DRAPE CAMERA CLOSED 9X96 (DRAPES) ×2 IMPLANT
ELECT BUTTON BIOP 24F 90D PLAS (MISCELLANEOUS) IMPLANT
ELECT LOOP HF 26F 30D .35MM (CUTTING LOOP) IMPLANT
ELECT LOOP MED HF 24F 12D CBL (CLIP) ×2 IMPLANT
ELECT REM PT RETURN 9FT ADLT (ELECTROSURGICAL)
ELECTRODE REM PT RTRN 9FT ADLT (ELECTROSURGICAL) IMPLANT
EVACUATOR MICROVAS BLADDER (UROLOGICAL SUPPLIES) IMPLANT
GLOVE BIO SURGEON STRL SZ8 (GLOVE) ×2 IMPLANT
GLOVE BIOGEL M 6.5 STRL (GLOVE) ×2 IMPLANT
GLOVE ECLIPSE 6.0 STRL STRAW (GLOVE) ×2 IMPLANT
GOWN PREVENTION PLUS LG XLONG (DISPOSABLE) ×2 IMPLANT
GOWN STRL REIN XL XLG (GOWN DISPOSABLE) ×2 IMPLANT
HOLDER FOLEY CATH W/STRAP (MISCELLANEOUS) ×2 IMPLANT
IV NS IRRIG 3000ML ARTHROMATIC (IV SOLUTION) ×20 IMPLANT
KIT ASPIRATION TUBING (SET/KITS/TRAYS/PACK) IMPLANT
LOOP CUTTING 24FR OLYMPUS (CUTTING LOOP) IMPLANT
LOOP ELECTRODE 28FR (MISCELLANEOUS) IMPLANT
NS IRRIG 500ML POUR BTL (IV SOLUTION) ×4 IMPLANT
PACK CYSTOSCOPY (CUSTOM PROCEDURE TRAY) ×2 IMPLANT
PLUG CATH AND CAP STER (CATHETERS) IMPLANT
WATER STERILE IRR 3000ML UROMA (IV SOLUTION) IMPLANT

## 2011-03-14 NOTE — Interval H&P Note (Signed)
History and Physical Interval Note:  03/14/2011 9:33 AM  Gabriela Smith  has presented today for surgery, with the diagnosis of Bladder Tumor  The various methods of treatment have been discussed with the patient and family. After consideration of risks, benefits and other options for treatment, the patient has consented to  Procedure(s): TRANSURETHRAL RESECTION OF BLADDER TUMOR (TURBT) as a surgical intervention .  The patients' history has been reviewed, patient examined, no change in status, stable for surgery.  I have reviewed the patients' chart and labs.  Questions were answered to the patient's satisfaction.     Garnett Farm

## 2011-03-14 NOTE — Anesthesia Preprocedure Evaluation (Addendum)
Anesthesia Evaluation  Patient identified by MRN, date of birth, ID band Patient awake    Reviewed: Allergy & Precautions, H&P , NPO status , Patient's Chart, lab work & pertinent test results, reviewed documented beta blocker date and time   Airway Mallampati: II TM Distance: >3 FB Neck ROM: Limited    Dental No notable dental hx. (+) Caps and Dental Advisory Given,    Pulmonary neg pulmonary ROS,  clear to auscultation  Pulmonary exam normal       Cardiovascular hypertension, Pt. on medications + CAD and neg cardio ROS Regular Normal    Neuro/Psych Negative Neurological ROS  Negative Psych ROS   GI/Hepatic negative GI ROS, Neg liver ROS,   Endo/Other  Negative Endocrine ROS  Renal/GU negative Renal ROS  Genitourinary negative   Musculoskeletal negative musculoskeletal ROS (+)   Abdominal   Peds negative pediatric ROS (+)  Hematology negative hematology ROS (+)   Anesthesia Other Findings   Reproductive/Obstetrics negative OB ROS                         Anesthesia Physical Anesthesia Plan  ASA: II  Anesthesia Plan: General   Post-op Pain Management:    Induction: Intravenous  Airway Management Planned: LMA  Additional Equipment:   Intra-op Plan:   Post-operative Plan: Extubation in OR  Informed Consent: I have reviewed the patients History and Physical, chart, labs and discussed the procedure including the risks, benefits and alternatives for the proposed anesthesia with the patient or authorized representative who has indicated his/her understanding and acceptance.   Dental advisory given  Plan Discussed with: CRNA  Anesthesia Plan Comments:        Anesthesia Quick Evaluation

## 2011-03-14 NOTE — Anesthesia Postprocedure Evaluation (Signed)
  Anesthesia Post-op Note  Patient: Gabriela Smith  Procedure(s) Performed:  TRANSURETHRAL RESECTION OF BLADDER TUMOR (TURBT); CYSTOSCOPY  Patient Location: PACU  Anesthesia Type: General  Level of Consciousness: awake and alert   Airway and Oxygen Therapy: Patient Spontanous Breathing  Post-op Pain: mild  Post-op Assessment: Post-op Vital signs reviewed, Patient's Cardiovascular Status Stable, Respiratory Function Stable, Patent Airway and No signs of Nausea or vomiting  Post-op Vital Signs: stable  Complications: No apparent anesthesia complications

## 2011-03-14 NOTE — Anesthesia Procedure Notes (Addendum)
Procedure Name: LMA Insertion Date/Time: 03/14/2011 9:42 AM Performed by: Lorrin Jackson Pre-anesthesia Checklist: Patient identified, Emergency Drugs available, Suction available and Patient being monitored Patient Re-evaluated:Patient Re-evaluated prior to inductionOxygen Delivery Method: Circle System Utilized Preoxygenation: Pre-oxygenation with 100% oxygen Intubation Type: IV induction Ventilation: Mask ventilation without difficulty LMA: LMA inserted LMA Size: 3.0 Number of attempts: 1 Airway Equipment and Method: bite block Placement Confirmation: positive ETCO2 Tube secured with: Tape Dental Injury: Teeth and Oropharynx as per pre-operative assessment  Comments: 1st attempt # 4 LMA Supreme.   Procedure Name: Intubation Date/Time: 03/14/2011 10:20 AM Performed by: Lorrin Jackson Pre-anesthesia Checklist: Patient identified, Emergency Drugs available, Suction available and Patient being monitored Patient Re-evaluated:Patient Re-evaluated prior to inductionOxygen Delivery Method: Circle System Utilized Preoxygenation: Pre-oxygenation with 100% oxygen Intubation Type: IV induction Ventilation: Mask ventilation without difficulty Laryngoscope Size: Mac and 3 Tube type: Oral Tube size: 7.0 mm Number of attempts: 1 Airway Equipment and Method: oral airway and bougie stylet Placement Confirmation: ETT inserted through vocal cords under direct vision,  positive ETCO2 and breath sounds checked- equal and bilateral Tube secured with: Tape Dental Injury: Teeth and Oropharynx as per pre-operative assessment  Comments: Surgeon Requested Muscle relaxation. Dr. Tiffany Kocher called. Pt intubated with bougie stylet. Limited neck movement.

## 2011-03-14 NOTE — Transfer of Care (Signed)
Immediate Anesthesia Transfer of Care Note  Patient: Gabriela Smith  Procedure(s) Performed:  TRANSURETHRAL RESECTION OF BLADDER TUMOR (TURBT); CYSTOSCOPY  Patient Location: Patient transported to PACU with oxygen via face mask at 6 Liters / Min  Anesthesia Type: General  Level of Consciousness: awake and alert   Airway & Oxygen Therapy: Patient Spontanous Breathing and Patient connected to face mask oxygen Post-op Assessment: Report given to PACU RN and Post -op Vital signs reviewed and stable  Post vital signs: Reviewed and stable  Complications: No apparent anesthesia complications

## 2011-03-14 NOTE — Op Note (Signed)
PATIENT:  Gabriela Smith  PRE-OPERATIVE DIAGNOSIS: Bladder tumor  POST-OPERATIVE DIAGNOSIS: Same  PROCEDURE:  Procedure(s): TRANSURETHRAL RESECTION OF BLADDER TUMOR (TURBT) (6.5cm.)  SURGEON:  Surgeon(s): Garnett Farm  ANESTHESIA:   General  EBL:  less than 50 mL  DRAINS: Urinary Catheter (22 Fr. Foley)   SPECIMEN:  Source of Specimen:  Bladder tumor  DISPOSITION OF SPECIMEN:  PATHOLOGY  Indication: [name] is a 71 year female patient who experienced gross hematuria and was found to have pyuria on several urinalyses. Approximately 3 months ago she began to have significant tissue area and gross hematuria. CT scan revealed normal upper tracts with a 6.8 x 5.2 cm mass in the bladder with no evidence of ureteral obstruction. Cystoscopically I found a large papillary tumor occupying the right wall of the bladder and encroaching upon the trigone but not involving the right ureteral orifice. She is brought to the operating room today for surgical excision of her bladder tumor.  Description of operation: The patient was taken to the operating room and administered general anesthesia. He was then placed on the table and moved to the dorsal lithotomy position after which his genitalia was sterilely prepped and draped. An official timeout was then performed.  The 26 French resectoscope with Timberlake obturator was then introduced into the bladder and the obturator was removed. The resectoscope element with 12 lens was then inserted and the bladder was fully and systematically inspected. Ureteral orifices were noted to be in the normal anatomic positions. Specifically the right ureteral orifice was mildly patulous and also noted to be well away from the bladder tumor. The tumor however was noted to occupy a large portion of the right lateral and posterior wall with no significant bladder neck involvement noted.  I first began by resecting the tumor and was able to resect down to the bladder  wall. The patient's tumor involving the right wall and therefore she required conversion from an LMA to endotracheal intubation during the case and paralyzed patient in order to safely allow me to resect the bladder tumor from the right side of the bladder without concern for obturator reflex. She had some diverticuli that were shallow and located on the posterior wall the bladder. Small papillary tumors were located surrounding this and were resected. All bleeding points were then fulgurated. Reinspection of the bladder revealed all obvious tumor had been fully resected and there was no evidence of perforation however there was an area on the right wall that appeared to be quite thin. The Microvasive evacuator was then used to irrigate the bladder and remove all of the portions of bladder tumor which were sent to pathology. I then removed the resectoscope.  A 22 French Foley catheter was then inserted in the bladder and irrigated. The irrigant returned slightly pink with no clots. It was my decision not to proceed with mitomycin C. due to the thinning of the bladder wall the right side and possible microperforations. I therefore have left a Foley catheter indwelling and will discharge the patient home with a Foley and then have it removed when she returns for followup. She tolerated the procedure well with no intraoperative complications.  PLAN OF CARE: Discharge to home after PACU  PATIENT DISPOSITION:  PACU - hemodynamically stable.

## 2011-03-17 ENCOUNTER — Encounter (HOSPITAL_BASED_OUTPATIENT_CLINIC_OR_DEPARTMENT_OTHER): Payer: Self-pay | Admitting: Urology

## 2011-04-02 ENCOUNTER — Other Ambulatory Visit: Payer: Self-pay | Admitting: Urology

## 2011-05-05 ENCOUNTER — Encounter (HOSPITAL_BASED_OUTPATIENT_CLINIC_OR_DEPARTMENT_OTHER): Payer: Self-pay | Admitting: *Deleted

## 2011-05-05 NOTE — Progress Notes (Signed)
NPO AFTER MN. ARRIVES AT 0730. NEEDS ISTAT. CURRENT EKG IN EPIC. WILL TAKE METOPROLOL AM OF SURG. W/ SIP OF WATER.

## 2011-05-09 ENCOUNTER — Ambulatory Visit (HOSPITAL_BASED_OUTPATIENT_CLINIC_OR_DEPARTMENT_OTHER): Payer: Medicare Other | Admitting: Anesthesiology

## 2011-05-09 ENCOUNTER — Encounter (HOSPITAL_BASED_OUTPATIENT_CLINIC_OR_DEPARTMENT_OTHER): Payer: Self-pay | Admitting: Anesthesiology

## 2011-05-09 ENCOUNTER — Ambulatory Visit (HOSPITAL_BASED_OUTPATIENT_CLINIC_OR_DEPARTMENT_OTHER)
Admission: RE | Admit: 2011-05-09 | Discharge: 2011-05-09 | Disposition: A | Discharge status: Home Health | Payer: Medicare Other | Source: Ambulatory Visit | Attending: Urology | Admitting: Urology

## 2011-05-09 ENCOUNTER — Encounter (HOSPITAL_BASED_OUTPATIENT_CLINIC_OR_DEPARTMENT_OTHER): Payer: Self-pay | Admitting: *Deleted

## 2011-05-09 ENCOUNTER — Encounter (HOSPITAL_BASED_OUTPATIENT_CLINIC_OR_DEPARTMENT_OTHER)
Admission: RE | Disposition: A | Discharge status: Home Health | Payer: Self-pay | Source: Ambulatory Visit | Attending: Urology

## 2011-05-09 DIAGNOSIS — I10 Essential (primary) hypertension: Secondary | ICD-10-CM | POA: Insufficient documentation

## 2011-05-09 DIAGNOSIS — Z01812 Encounter for preprocedural laboratory examination: Secondary | ICD-10-CM | POA: Insufficient documentation

## 2011-05-09 DIAGNOSIS — E7800 Pure hypercholesterolemia, unspecified: Secondary | ICD-10-CM | POA: Insufficient documentation

## 2011-05-09 DIAGNOSIS — R310 Gross hematuria: Secondary | ICD-10-CM | POA: Insufficient documentation

## 2011-05-09 DIAGNOSIS — C679 Malignant neoplasm of bladder, unspecified: Secondary | ICD-10-CM

## 2011-05-09 DIAGNOSIS — Z79899 Other long term (current) drug therapy: Secondary | ICD-10-CM | POA: Insufficient documentation

## 2011-05-09 DIAGNOSIS — C674 Malignant neoplasm of posterior wall of bladder: Secondary | ICD-10-CM | POA: Insufficient documentation

## 2011-05-09 HISTORY — DX: Personal history of malignant neoplasm of bladder: Z85.51

## 2011-05-09 HISTORY — PX: TRANSURETHRAL RESECTION OF BLADDER TUMOR: SHX2575

## 2011-05-09 HISTORY — PX: CYSTOSCOPY WITH BIOPSY: SHX5122

## 2011-05-09 LAB — POCT I-STAT 4, (NA,K, GLUC, HGB,HCT)
Glucose, Bld: 114 mg/dL — ABNORMAL HIGH (ref 70–99)
HCT: 37 % (ref 36.0–46.0)
Hemoglobin: 12.6 g/dL (ref 12.0–15.0)
Potassium: 3.8 mEq/L (ref 3.5–5.1)
Sodium: 143 mEq/L (ref 135–145)

## 2011-05-09 SURGERY — CYSTOSCOPY, WITH BIOPSY
Anesthesia: General | Site: Bladder | Wound class: Clean Contaminated

## 2011-05-09 MED ORDER — LIDOCAINE HCL (CARDIAC) 20 MG/ML IV SOLN
INTRAVENOUS | Status: DC | PRN
  Administered 2011-05-09: 80 mg via INTRAVENOUS

## 2011-05-09 MED ORDER — MITOMYCIN CHEMO FOR BLADDER INSTILLATION 40 MG
40.0000 mg | Freq: Once | INTRAVENOUS | Status: AC
  Administered 2011-05-09: 40 mg via INTRAVESICAL
  Filled 2011-05-09: qty 40

## 2011-05-09 MED ORDER — PROPOFOL 10 MG/ML IV EMUL
INTRAVENOUS | Status: DC | PRN
  Administered 2011-05-09: 200 mg via INTRAVENOUS
  Administered 2011-05-09: 20 mg via INTRAVENOUS

## 2011-05-09 MED ORDER — TRAMADOL HCL 50 MG PO TABS: 50.0000 mg | ORAL_TABLET | Freq: Four times a day (QID) | ORAL | Status: AC | PRN

## 2011-05-09 MED ORDER — FENTANYL CITRATE 0.05 MG/ML IJ SOLN
INTRAMUSCULAR | Status: DC | PRN
  Administered 2011-05-09 (×2): 25 ug via INTRAVENOUS
  Administered 2011-05-09 (×2): 50 ug via INTRAVENOUS
  Administered 2011-05-09 (×2): 25 ug via INTRAVENOUS

## 2011-05-09 MED ORDER — PROMETHAZINE HCL 25 MG/ML IJ SOLN
6.2500 mg | Freq: Four times a day (QID) | INTRAMUSCULAR | Status: DC | PRN
  Administered 2011-05-09: 6.25 mg via INTRAVENOUS

## 2011-05-09 MED ORDER — CIPROFLOXACIN IN D5W 200 MG/100ML IV SOLN
200.0000 mg | INTRAVENOUS | Status: AC
  Administered 2011-05-09: 200 mg via INTRAVENOUS

## 2011-05-09 MED ORDER — PHENAZOPYRIDINE HCL 200 MG PO TABS: 200.0000 mg | ORAL_TABLET | Freq: Three times a day (TID) | ORAL | Status: AC | PRN

## 2011-05-09 MED ORDER — DEXAMETHASONE SODIUM PHOSPHATE 4 MG/ML IJ SOLN
INTRAMUSCULAR | Status: DC | PRN
  Administered 2011-05-09: 8 mg via INTRAVENOUS

## 2011-05-09 MED ORDER — LACTATED RINGERS IV SOLN
INTRAVENOUS | Status: DC
  Administered 2011-05-09: 09:00:00 via INTRAVENOUS
  Administered 2011-05-09: 100 mL/h via INTRAVENOUS

## 2011-05-09 MED ORDER — SODIUM CHLORIDE 0.9 % IR SOLN
Status: DC | PRN
  Administered 2011-05-09: 6000 mL

## 2011-05-09 MED ORDER — FENTANYL CITRATE 0.05 MG/ML IJ SOLN: 25.0000 ug | INTRAMUSCULAR | Status: DC | PRN

## 2011-05-09 SURGICAL SUPPLY — 36 items
BAG DRAIN URO-CYSTO SKYTR STRL (DRAIN) ×2 IMPLANT
BAG DRN ANRFLXCHMBR STRAP LEK (BAG)
BAG DRN UROCATH (DRAIN) ×1
BAG URINE DRAINAGE (UROLOGICAL SUPPLIES) IMPLANT
BAG URINE LEG 19OZ MD ST LTX (BAG) IMPLANT
CANISTER SUCT LVC 12 LTR MEDI- (MISCELLANEOUS) IMPLANT
CATH FOLEY 2WAY SLVR  5CC 20FR (CATHETERS)
CATH FOLEY 2WAY SLVR  5CC 22FR (CATHETERS)
CATH FOLEY 2WAY SLVR  5CC 24FR (CATHETERS) ×1
CATH FOLEY 2WAY SLVR 5CC 20FR (CATHETERS) IMPLANT
CATH FOLEY 2WAY SLVR 5CC 22FR (CATHETERS) IMPLANT
CATH FOLEY 2WAY SLVR 5CC 24FR (CATHETERS) ×1 IMPLANT
CLOTH BEACON ORANGE TIMEOUT ST (SAFETY) ×2 IMPLANT
DRAPE CAMERA CLOSED 9X96 (DRAPES) ×2 IMPLANT
ELECT BUTTON BIOP 24F 90D PLAS (MISCELLANEOUS) IMPLANT
ELECT LOOP HF 26F 30D .35MM (CUTTING LOOP) IMPLANT
ELECT LOOP MED HF 24F 12D CBL (CLIP) ×2 IMPLANT
ELECT REM PT RETURN 9FT ADLT (ELECTROSURGICAL) ×2
ELECT RESECT VAPORIZE 12D CBL (ELECTRODE) IMPLANT
ELECTRODE REM PT RTRN 9FT ADLT (ELECTROSURGICAL) ×1 IMPLANT
EVACUATOR MICROVAS BLADDER (UROLOGICAL SUPPLIES) ×2 IMPLANT
GLOVE BIO SURGEON STRL SZ 6.5 (GLOVE) ×2 IMPLANT
GLOVE BIO SURGEON STRL SZ8 (GLOVE) ×2 IMPLANT
GLOVE ECLIPSE 6.5 STRL STRAW (GLOVE) ×4 IMPLANT
GOWN PREVENTION PLUS LG XLONG (DISPOSABLE) ×4 IMPLANT
GOWN STRL REIN XL XLG (GOWN DISPOSABLE) ×2 IMPLANT
HOLDER FOLEY CATH W/STRAP (MISCELLANEOUS) IMPLANT
IV NS IRRIG 3000ML ARTHROMATIC (IV SOLUTION) ×6 IMPLANT
KIT ASPIRATION TUBING (SET/KITS/TRAYS/PACK) ×2 IMPLANT
LOOP CUTTING 24FR OLYMPUS (CUTTING LOOP) IMPLANT
NEEDLE HYPO 22GX1.5 SAFETY (NEEDLE) IMPLANT
NS IRRIG 500ML POUR BTL (IV SOLUTION) ×4 IMPLANT
PACK CYSTOSCOPY (CUSTOM PROCEDURE TRAY) ×2 IMPLANT
PLUG CATH AND CAP STER (CATHETERS) IMPLANT
SET ASPIRATION TUBING (TUBING) IMPLANT
WATER STERILE IRR 3000ML UROMA (IV SOLUTION) IMPLANT

## 2011-05-09 NOTE — Anesthesia Preprocedure Evaluation (Addendum)
Anesthesia Evaluation  Patient identified by MRN, date of birth, ID band Patient awake    Reviewed: Allergy & Precautions, H&P , NPO status , Patient's Chart, lab work & pertinent test results  Airway Mallampati: II TM Distance: >3 FB Neck ROM: Full    Dental  (+) Caps and Dental Advisory Given She does have symptoms with right upper rear filling since anesthesia one month ago. Denies loose teeth:   Pulmonary neg pulmonary ROS,  clear to auscultation  Pulmonary exam normal       Cardiovascular Exercise Tolerance: Good hypertension, Pt. on home beta blockers + CAD Regular Normal Angioplasty 1993, no stent. No chest pains, SOB. Last saw Dr. Antoine Poche about a year ago.   Neuro/Psych Negative Neurological ROS  Negative Psych ROS   GI/Hepatic negative GI ROS, Neg liver ROS,   Endo/Other  Negative Endocrine ROS  Renal/GU negative Renal ROS  Genitourinary negative   Musculoskeletal negative musculoskeletal ROS (+)   Abdominal   Peds negative pediatric ROS (+)  Hematology negative hematology ROS (+)   Anesthesia Other Findings   Reproductive/Obstetrics negative OB ROS                          Anesthesia Physical Anesthesia Plan  ASA: III  Anesthesia Plan: General   Post-op Pain Management:    Induction: Intravenous  Airway Management Planned: LMA  Additional Equipment:   Intra-op Plan:   Post-operative Plan: Extubation in OR  Informed Consent: I have reviewed the patients History and Physical, chart, labs and discussed the procedure including the risks, benefits and alternatives for the proposed anesthesia with the patient or authorized representative who has indicated his/her understanding and acceptance.   Dental advisory given  Plan Discussed with: CRNA  Anesthesia Plan Comments:         Anesthesia Quick Evaluation

## 2011-05-09 NOTE — Discharge Instructions (Signed)
Bladder biopsy instructions   Definition:  Transurethral Resection of the Bladder Tumor is a surgical procedure used to diagnose and remove tumors within the bladder. TURBT is the most common treatment for early stage bladder cancer.  General instructions:     Your recent bladder surgery requires very little post hospital care but some definite precautions.  Despite the fact that no skin incisions were used, the area around the bladder incisions are raw and covered with scabs to promote healing and prevent bleeding. Certain precautions are needed to insure that the scabs are not disturbed over the next 2-4 weeks while the healing proceeds.  Because the raw surface inside your bladder and the irritating effects of urine you may expect frequency of urination and/or urgency (a stronger desire to urinate) and perhaps even getting up at night more often. This will usually resolve or improve slowly over the healing period. You may see some blood in your urine over the first 6 weeks. Do not be alarmed, even if the urine was clear for a while. Get off your feet and drink lots of fluids until clearing occurs. If you start to pass clots or don't improve call us.  Catheter: (If you are discharged with a catheter.)  1. Keep your catheter secured to your leg at all times with tape or the supplied strap. 2. You may experience leakage of urine around your catheter- as long as the  catheter continues to drain, this is normal.  If your catheter stops draining  go to the ER. 3. You may also have blood in your urine, even after it has been clear for  several days; you may even pass some small blood clots or other material.  This  is normal as well.  If this happens, sit down and drink plenty of water to help  make urine to flush out your bladder.  If the blood in your urine becomes worse  after doing this, contact our office or return to the ER. 4. You may use the leg bag (small bag) during the day, but use  the large bag at  night.  Diet:  You may return to your normal diet immediately. Because of the raw surface of your bladder, alcohol, spicy foods, foods high in acid and drinks with caffeine may cause irritation or frequency and should be used in moderation. To keep your urine flowing freely and avoid constipation, drink plenty of fluids during the day (8-10 glasses). Tip: Avoid cranberry juice because it is very acidic.  Activity:  Your physical activity doesn't need to be restricted. However, if you are very active, you may see some blood in the urine. We suggest that you reduce your activity under the circumstances until the bleeding has stopped.  Bowels:  It is important to keep your bowels regular during the postoperative period. Straining with bowel movements can cause bleeding. A bowel movement every other day is reasonable. Use a mild laxative if needed, such as milk of magnesia 2-3 tablespoons, or 2 Dulcolax tablets. Call if you continue to have problems. If you had been taking narcotics for pain, before, during or after your surgery, you may be constipated. Take a laxative if necessary.    Medication:  You should resume your pre-surgery medications unless told not to. In addition you may be given an antibiotic to prevent or treat infection. Antibiotics are not always necessary. All medication should be taken as prescribed until the bottles are finished unless you are having an unusual reaction  to one of the drugs.

## 2011-05-09 NOTE — H&P (Signed)
History of Present Illness  Transitional cell carcinoma of the bladder: Workup of gross hematuria in 11/12 revealed normal upper tracts by CT scan and a 7 cm tumor in the bladder involving the right wall. She underwent TURBT on 03/14/11 and at that time her tumor was fully resected. It was noted to be superior and lateral to the right ureteral orifice. Pathology: Transitional cell carcinoma Ta,G3.    Past Medical History Problems  1. History of  Arthritis V13.4 2. History of  Heart Disease 429.9 3. History of  Hypercholesterolemia 272.0 4. History of  Hypertension 401.9  Surgical History Problems  1. History of  Cholecystectomy 2. History of  Cystoscopy With Fulguration Large Lesion (Over 5cm) 3. History of  Previous Balloon Angioplasty 4. History of  Umbilical Hernia Repair  Current Meds 1. Calcium + D TABS; Therapy: (Recorded:13Nov2012) to 2. Crestor 5 MG Oral Tablet; Therapy: 30Jan2012 to 3. Flaxseed Oil CAPS; Therapy: (Recorded:13Nov2012) to 4. Metoprolol Tartrate 25 MG Oral Tablet; Therapy: 26Jun2012 to 5. Multi-Day TABS; Therapy: (Recorded:13Nov2012) to 6. VESIcare 10 MG Oral Tablet; a tablet daily; Therapy: 03Dec2012 to (Last Rx:03Dec2012)   Requested for: 03Dec2012 7. Vitamin B Complex Oral Capsule; Therapy: (Recorded:13Nov2012) to  Allergies Medication  1. Codeine Derivatives 2. Nitrofurantoin CAPS 3. Ibuprofen TABS  Family History Problems  1. Family history of  Death In The Family Father 2. Family history of  Death In The Family Mother 3. Family history of  Family Health Status Number Of Children 2 SONS 1 DAUGHTER 4. Family history of  Heart Disease V17.49 5. Family history of  Hypertension V17.49  Social History Problems  1. Alcohol Use 2. Marital History - Currently Married 3. Never A Smoker 4. Retired From Work Denied  5. History of  Caffeine Use Review of Systems Genitourinary, constitutional, skin, eye, otolaryngeal, hematologic/lymphatic,  cardiovascular, pulmonary, endocrine, musculoskeletal, gastrointestinal, neurological and psychiatric system(s) were reviewed and pertinent findings if present are noted.  Genitourinary: urinary frequency, urinary urgency, dysuria, nocturia and urinary stream starts and stops.  Gastrointestinal: heartburn.  Constitutional: feeling tired (fatigue).  Integumentary: skin rash/lesion and pruritus.  Musculoskeletal: back pain and joint pain.    Vitals Vital Signs BMI Calculated: 28.17 BSA Calculated: 1.81 Height: 5 ft 4 in Weight: 165 lb  Blood Pressure: 160 / 79 Temperature: 98.4 F Heart Rate: 72  Physical Exam Constitutional: Well nourished and well developed . No acute distress.  ENT:. The ears and nose are normal in appearance.  Neck: The appearance of the neck is normal and no neck mass is present.  Pulmonary: No respiratory distress and normal respiratory rhythm and effort.  Cardiovascular: Heart rate and rhythm are normal . No peripheral edema.  Abdomen: The abdomen is soft and nontender. No masses are palpated. No CVA tenderness. No hernias are palpable. No hepatosplenomegaly noted.  Genitourinary: Examination of the external genitalia shows normal female external genitalia and no lesions. The urethra is normal in appearance and not tender. There is no urethral mass. Vaginal exam demonstrates no abnormalities. The adnexa are palpably normal. The bladder is non tender and not distended. The anus is normal on inspection. The perineum is normal on inspection.  Lymphatics: The femoral and inguinal nodes are not enlarged or tender.  Skin: Normal skin turgor, no visible rash and no visible skin lesions.  Neuro/Psych:. Mood and affect are appropriate.  Assessment Assessed    I went over her pathology report with her which fortunately has revealed the lesion was superficial although it was high-grade.  There was no evidence of lamina propria invasion and there was muscularis present that was  free of tumor. I have discussed with her the fact that because she had a very large tumor and it was high-grade I would recommend reevaluation with cystoscopy and potentially resection of any further tumor identified with further biopsying at that time in order to be sure she is accurately stage and cleared of all visible tumor. She will then need to undergo an induction course of BCG.   Plan She presents today for repeat biopsy due to the presence of high-grade transitional cell carcinoma of the bladder. The tumor was large and therefore the need for re-evaluation to be sure all visible tumor has been fully removed from the bladder and for further staging purposes prior to consideration of intravesical BCG.

## 2011-05-09 NOTE — Anesthesia Procedure Notes (Signed)
Procedure Name: LMA Insertion Date/Time: 05/09/2011 8:27 AM Performed by: Lorrin Jackson Pre-anesthesia Checklist: Patient identified, Emergency Drugs available, Suction available and Patient being monitored Patient Re-evaluated:Patient Re-evaluated prior to inductionOxygen Delivery Method: Circle System Utilized Preoxygenation: Pre-oxygenation with 100% oxygen Intubation Type: IV induction Ventilation: Mask ventilation without difficulty LMA: LMA with gastric port inserted LMA Size: 4.0 Number of attempts: 1 Placement Confirmation: positive ETCO2 Tube secured with: Tape Dental Injury: Teeth and Oropharynx as per pre-operative assessment

## 2011-05-09 NOTE — Op Note (Signed)
Dr Council Mechanic notified of pt's c/o nausea & no nausea med ordered. New order noted

## 2011-05-09 NOTE — Op Note (Signed)
PATIENT:  Gabriela Smith  PRE-OPERATIVE DIAGNOSIS: Bladder tumor  POST-OPERATIVE DIAGNOSIS: Same  PROCEDURE:  Procedure(s): TRANSURETHRAL RESECTION OF BLADDER TUMOR (TURBT) (0.6cm.)  SURGEON:  Surgeon(s):1. Bladder tumor 2. Base of bladder tumor Gabriela Smith  ANESTHESIA:   General  EBL:  Minimal  DRAINS: Urinary Catheter 20 Fr. Foley)   SPECIMEN:  Source of Specimen:  Bladder tumor  DISPOSITION OF SPECIMEN:  PATHOLOGY  Indication: Gabriela Smith is a 71 year old female with a history of transitional cell carcinoma of the bladder. She gross hematuria and underwent upper tract evaluation it revealed no abnormality of the upper tracts by 7 cm tumor was found in the bladder involving the right wall. She underwent a TURBT on 03/14/11 and at that time the tumor appeared to be fully resected. It revealed a Ta,G3 tumor. We therefore discussed repeat evaluation because of the large volume of tumor present.  Description of operation: The patient was taken to the operating room and administered general anesthesia. He was then placed on the table and moved to the dorsal lithotomy position after which his genitalia was sterilely prepped and draped. An official timeout was then performed.  The 26 French resectoscope with Timberlake obturator was then introduced into the bladder and the obturator was removed. The resectoscope element with 12 lens was then inserted and the bladder was fully and systematically inspected. Ureteral orifices were noted to be  patulous with the right orifice more open than the left. I identified a tumor on the superior wall at the 12:00 position and a second smaller tumor on the posterior wall that appeared to be only about 3 mm in size.   I first began by resecting  the tumor on the superior wall. It was fully resected and I had initially evaluated the bladder with the 70 lens and could see the tumor much better then with the 12 lens. Because of that I reinserted the  cystoscope with the 70 lens in order to evaluate the completeness of the resection and noted all of the bladder tumor had been completely resected. No evidence of perforation was identified. I then fulgurated the small tumor on the posterior wall of the bladder. I then resected deeper into the base of the tumor and sent this as a separate specimen. After the removing all portions of the bladder tumor from the bladder I fulgurated the base of the location where the tumor was resected. Reinspection of the bladder revealed all obvious tumor had been fully resected and there was no evidence of perforation. The Microvasive evacuator was then used to irrigate the bladder and remove all of the portions of bladder tumor which were sent to pathology. I then removed the resectoscope.  A 20 French Foley catheter was then inserted in the bladder and irrigated. The irrigant returned slightly pink with no clots. I then instilled 40 mg of mitomycin-C in 40 cc of water and plugged the catheter. This will remain indwelling for approximately one hour. It will then be drained from the bladder and the catheter will be removed and the patient discharged home.  PLAN OF CARE: Discharge to home after PACU  PATIENT DISPOSITION:  PACU - hemodynamically stable.

## 2011-05-09 NOTE — Transfer of Care (Signed)
Immediate Anesthesia Transfer of Care Note  Patient: Gabriela Smith  Procedure(s) Performed: Procedure(s) (LRB): CYSTOSCOPY WITH BIOPSY (N/A) TRANSURETHRAL RESECTION OF BLADDER TUMOR (TURBT) (N/A)  Patient Location: Patient transported to PACU with oxygen via face mask at 4 Liters / Min  Anesthesia Type: General  Level of Consciousness: awake and alert   Airway & Oxygen Therapy: Patient Spontanous Breathing and Patient connected to face mask oxygen  Post-op Assessment: Report given to PACU RN and Post -op Vital signs reviewed and stable  Post vital signs: Reviewed and stable  Dentition: Teeth and oropharynx remain in pre-op condition  Complications: No apparent anesthesia complications

## 2011-05-09 NOTE — Progress Notes (Signed)
Foley cath unplugged & connected to foley cath draining cl purple urine

## 2011-05-09 NOTE — Anesthesia Postprocedure Evaluation (Signed)
  Anesthesia Post-op Note  Patient: Gabriela Smith  Procedure(s) Performed: Procedure(s) (LRB): CYSTOSCOPY WITH BIOPSY (N/A) TRANSURETHRAL RESECTION OF BLADDER TUMOR (TURBT) (N/A)  Patient Location: PACU  Anesthesia Type: General  Level of Consciousness: awake and alert   Airway and Oxygen Therapy: Patient Spontanous Breathing  Post-op Pain: mild  Post-op Assessment: Post-op Vital signs reviewed, Patient's Cardiovascular Status Stable, Respiratory Function Stable, Patent Airway and No signs of Nausea or vomiting  Post-op Vital Signs: stable  Complications: No apparent anesthesia complications

## 2011-05-12 ENCOUNTER — Encounter (HOSPITAL_BASED_OUTPATIENT_CLINIC_OR_DEPARTMENT_OTHER): Payer: Self-pay | Admitting: Urology

## 2011-09-22 ENCOUNTER — Encounter: Payer: Self-pay | Admitting: Cardiology

## 2011-12-08 ENCOUNTER — Other Ambulatory Visit: Payer: Self-pay | Admitting: Obstetrics & Gynecology

## 2011-12-08 DIAGNOSIS — Z1231 Encounter for screening mammogram for malignant neoplasm of breast: Secondary | ICD-10-CM

## 2011-12-29 ENCOUNTER — Other Ambulatory Visit: Payer: Self-pay | Admitting: Family Medicine

## 2011-12-29 ENCOUNTER — Ambulatory Visit
Admission: RE | Admit: 2011-12-29 | Discharge: 2011-12-29 | Disposition: A | Discharge status: Home / Self Care | Payer: Medicare Other | Source: Ambulatory Visit | Attending: Obstetrics & Gynecology | Admitting: Obstetrics & Gynecology

## 2011-12-29 DIAGNOSIS — Z1231 Encounter for screening mammogram for malignant neoplasm of breast: Secondary | ICD-10-CM

## 2012-01-08 ENCOUNTER — Encounter: Payer: Self-pay | Admitting: Cardiology

## 2012-02-11 ENCOUNTER — Other Ambulatory Visit: Payer: Self-pay | Admitting: Urology

## 2012-02-12 MED ORDER — MITOMYCIN CHEMO FOR BLADDER INSTILLATION 40 MG: 40.0000 mg | Freq: Once | INTRAVENOUS | Status: DC

## 2012-03-01 ENCOUNTER — Encounter (HOSPITAL_BASED_OUTPATIENT_CLINIC_OR_DEPARTMENT_OTHER): Payer: Self-pay | Admitting: *Deleted

## 2012-03-01 ENCOUNTER — Other Ambulatory Visit: Payer: Self-pay | Admitting: Urology

## 2012-03-01 NOTE — Progress Notes (Signed)
To [facility]  at 0815 Istat on arrival- Ekg,in epic-requested Cxr from primary Dr Christell Constant [phone] take metoprolol w/sip water am of procedure.

## 2012-03-07 NOTE — H&P (Signed)
History of Present Illness  Transitional cell carcinoma of the bladder: Workup of gross hematuria in 11/12 revealed normal upper tracts by CT scan and a 7 cm tumor in the bladder involving the right wall. She underwent TURBT on 03/14/11 and at that time her tumor was fully resected. It was noted to be superior and lateral to the right ureteral orifice. Pathology: Transitional cell carcinoma Ta,G3.  She had a recurrent tumor identified on repeat resection on 05/09/11.  Pathology: Ta,G3 Treatment: 6 week induction course of BCG completed in 5/13  Interval history: She has been doing well with no new irritative symptoms or hematuria.   Past Medical History Problems  1. History of  Arthritis V13.4 2. History of  Heart Disease 429.9 3. History of  Hypercholesterolemia 272.0 4. History of  Hypertension 401.9  Surgical History Problems  1. History of  Cholecystectomy 2. History of  Cystoscopy With Fulguration Large Lesion (Over 5cm) 3. History of  Cystoscopy With Fulguration Small Lesion (5-25mm) 4. History of  Previous Balloon Angioplasty 5. History of  Umbilical Hernia Repair  Current Meds 1. Co Q 10 CAPS; Therapy: (Recorded:27Aug2013) to 2. Metoprolol Tartrate 25 MG Oral Tablet; Therapy: 26Jun2012 to 3. Multi-Vitamin TABS; Therapy: (Recorded:27Aug2013) to  Allergies Medication  1. Codeine Derivatives 2. Nitrofurantoin CAPS  Family History Problems  1. Family history of  Death In The Family Father 2. Family history of  Death In The Family Mother 3. Family history of  Family Health Status Number Of Children 2 SONS 1 DAUGHTER 4. Family history of  Heart Disease V17.49 5. Family history of  Hypertension V17.49  Social History Problems  1. Alcohol Use 2. Marital History - Currently Married 3. Never A Smoker 4. Retired From Work Denied  5. History of  Caffeine Use  Review of Systems Genitourinary, constitutional, skin, eye, otolaryngeal, hematologic/lymphatic, cardiovascular,  pulmonary, endocrine, musculoskeletal, gastrointestinal, neurological and psychiatric system(s) were reviewed and pertinent findings if present are noted.  Genitourinary: urinary frequency, urinary urgency, dysuria, nocturia and urinary stream starts and stops.  Gastrointestinal: heartburn.  Constitutional: feeling tired (fatigue).  Integumentary: skin rash/lesion and pruritus.  Musculoskeletal: back pain and joint pain.    Vitals Vital Signs  BMI Calculated: 28.17 BSA Calculated: 1.81 Height: 5 ft 4 in Weight: 165 lb  Blood Pressure: 160 / 79 Temperature: 98.4 F Heart Rate: 72  Physical Exam Constitutional: Well nourished and well developed . No acute distress.  ENT:. The ears and nose are normal in appearance.  Neck: The appearance of the neck is normal and no neck mass is present.  Pulmonary: No respiratory distress and normal respiratory rhythm and effort.  Cardiovascular: Heart rate and rhythm are normal . No peripheral edema.  Abdomen: The abdomen is soft and nontender. No masses are palpated. No CVA tenderness. No hernias are palpable. No hepatosplenomegaly noted.  Genitourinary: Examination of the external genitalia shows normal female external genitalia and no lesions. The urethra is normal in appearance and not tender. There is no urethral mass. Vaginal exam demonstrates no abnormalities. The adnexa are palpably normal. The bladder is non tender and not distended. The anus is normal on inspection. The perineum is normal on inspection.  Lymphatics: The femoral and inguinal nodes are not enlarged or tender.  Skin: Normal skin turgor, no visible rash and no visible skin lesions.  Neuro/Psych:. Mood and affect are appropriate.   Procedure  Procedure: Cystoscopy  Chaperone Present: .  Indication: History of Urothelial Carcinoma.  Informed Consent: Risks, benefits, and  potential adverse events were discussed and informed consent was obtained from the patient.  Prep: The patient  was prepped with hibiclens.  Procedure Note:  Urethral meatus:. No abnormalities.  Anterior urethra: No abnormalities.  Bladder: Visulization was clear. The ureteral orifices were in the normal anatomic position bilaterally and had clear efflux of urine. Multiple tumors were identified in the bladder. A papillary tumor was seen in the bladder. (I counted at least for tumors. There were 3 at the dome and one on the left wall) The patient tolerated the procedure well.  Complications: None.    Assessment Assessed  1. Transitional Cell Carcinoma Of The Bladder 188.9   I found further recurrent tumors in her bladder today. I am going to perform outpatient excision of the tumors. We then discussed postoperative mitomycin C. Followed by a second induction course of BCG and then BCG prophylaxis.   Plan    She will be scheduled for outpatient resection of her bladder tumors with postoperative intravesical mitomycin-C.

## 2012-03-08 ENCOUNTER — Encounter (HOSPITAL_BASED_OUTPATIENT_CLINIC_OR_DEPARTMENT_OTHER): Payer: Self-pay | Admitting: *Deleted

## 2012-03-08 ENCOUNTER — Ambulatory Visit (HOSPITAL_BASED_OUTPATIENT_CLINIC_OR_DEPARTMENT_OTHER): Payer: Medicare Other | Admitting: Anesthesiology

## 2012-03-08 ENCOUNTER — Encounter (HOSPITAL_BASED_OUTPATIENT_CLINIC_OR_DEPARTMENT_OTHER)
Admission: RE | Disposition: A | Discharge status: Home / Self Care | Payer: Self-pay | Source: Ambulatory Visit | Attending: Urology

## 2012-03-08 ENCOUNTER — Ambulatory Visit (HOSPITAL_BASED_OUTPATIENT_CLINIC_OR_DEPARTMENT_OTHER)
Admission: RE | Admit: 2012-03-08 | Discharge: 2012-03-08 | Disposition: A | Discharge status: Home / Self Care | Payer: Medicare Other | Source: Ambulatory Visit | Attending: Urology | Admitting: Urology

## 2012-03-08 ENCOUNTER — Encounter (HOSPITAL_BASED_OUTPATIENT_CLINIC_OR_DEPARTMENT_OTHER): Payer: Self-pay | Admitting: Anesthesiology

## 2012-03-08 DIAGNOSIS — D494 Neoplasm of unspecified behavior of bladder: Secondary | ICD-10-CM

## 2012-03-08 DIAGNOSIS — C671 Malignant neoplasm of dome of bladder: Secondary | ICD-10-CM | POA: Insufficient documentation

## 2012-03-08 DIAGNOSIS — I10 Essential (primary) hypertension: Secondary | ICD-10-CM | POA: Insufficient documentation

## 2012-03-08 DIAGNOSIS — Z01812 Encounter for preprocedural laboratory examination: Secondary | ICD-10-CM | POA: Insufficient documentation

## 2012-03-08 HISTORY — PX: TRANSURETHRAL RESECTION OF BLADDER TUMOR: SHX2575

## 2012-03-08 HISTORY — DX: Unspecified osteoarthritis, unspecified site: M19.90

## 2012-03-08 LAB — POCT I-STAT 4, (NA,K, GLUC, HGB,HCT)
Glucose, Bld: 93 mg/dL (ref 70–99)
HCT: 41 % (ref 36.0–46.0)
Hemoglobin: 13.9 g/dL (ref 12.0–15.0)
Potassium: 3.9 mEq/L (ref 3.5–5.1)
Sodium: 141 mEq/L (ref 135–145)

## 2012-03-08 SURGERY — TURBT (TRANSURETHRAL RESECTION OF BLADDER TUMOR)
Anesthesia: General | Site: Bladder | Wound class: Clean Contaminated

## 2012-03-08 MED ORDER — MEPERIDINE HCL 25 MG/ML IJ SOLN
6.2500 mg | INTRAMUSCULAR | Status: DC | PRN
  Filled 2012-03-08: qty 1

## 2012-03-08 MED ORDER — ACETAMINOPHEN 10 MG/ML IV SOLN
INTRAVENOUS | Status: DC | PRN
  Administered 2012-03-08: 1000 mg via INTRAVENOUS

## 2012-03-08 MED ORDER — FENTANYL CITRATE 0.05 MG/ML IJ SOLN
INTRAMUSCULAR | Status: DC | PRN
  Administered 2012-03-08 (×2): 25 ug via INTRAVENOUS
  Administered 2012-03-08: 50 ug via INTRAVENOUS

## 2012-03-08 MED ORDER — MITOMYCIN CHEMO FOR BLADDER INSTILLATION 40 MG
40.0000 mg | Freq: Once | INTRAVENOUS | Status: AC
  Administered 2012-03-08: 40 mg via INTRAVESICAL
  Filled 2012-03-08: qty 40

## 2012-03-08 MED ORDER — STERILE WATER FOR IRRIGATION IR SOLN
Status: DC | PRN
  Administered 2012-03-08: 3000 mL

## 2012-03-08 MED ORDER — SODIUM CHLORIDE 0.9 % IR SOLN
Status: DC | PRN
  Administered 2012-03-08: 3000 mL via INTRAVESICAL

## 2012-03-08 MED ORDER — OXYBUTYNIN CHLORIDE 5 MG PO TABS: 5.0000 mg | ORAL_TABLET | Freq: Once | ORAL | Status: DC

## 2012-03-08 MED ORDER — LIDOCAINE HCL (CARDIAC) 20 MG/ML IV SOLN
INTRAVENOUS | Status: DC | PRN
  Administered 2012-03-08: 80 mg via INTRAVENOUS

## 2012-03-08 MED ORDER — DEXAMETHASONE SODIUM PHOSPHATE 4 MG/ML IJ SOLN
INTRAMUSCULAR | Status: DC | PRN
  Administered 2012-03-08: 10 mg via INTRAVENOUS

## 2012-03-08 MED ORDER — PROMETHAZINE HCL 25 MG/ML IJ SOLN
6.2500 mg | INTRAMUSCULAR | Status: DC | PRN
  Filled 2012-03-08: qty 1

## 2012-03-08 MED ORDER — FENTANYL CITRATE 0.05 MG/ML IJ SOLN
25.0000 ug | INTRAMUSCULAR | Status: DC | PRN
  Filled 2012-03-08: qty 1

## 2012-03-08 MED ORDER — PHENAZOPYRIDINE HCL 200 MG PO TABS: 200.0000 mg | ORAL_TABLET | Freq: Once | ORAL | Status: DC

## 2012-03-08 MED ORDER — CIPROFLOXACIN IN D5W 200 MG/100ML IV SOLN
200.0000 mg | INTRAVENOUS | Status: AC
  Administered 2012-03-08: 200 mg via INTRAVENOUS
  Filled 2012-03-08: qty 100

## 2012-03-08 MED ORDER — ONDANSETRON HCL 4 MG/2ML IJ SOLN
INTRAMUSCULAR | Status: DC | PRN
  Administered 2012-03-08: 4 mg via INTRAVENOUS

## 2012-03-08 MED ORDER — PROPOFOL 10 MG/ML IV BOLUS
INTRAVENOUS | Status: DC | PRN
  Administered 2012-03-08: 180 mg via INTRAVENOUS

## 2012-03-08 MED ORDER — PHENAZOPYRIDINE HCL 200 MG PO TABS: 200.0000 mg | ORAL_TABLET | Freq: Three times a day (TID) | ORAL | Status: DC | PRN

## 2012-03-08 MED ORDER — LACTATED RINGERS IV SOLN
INTRAVENOUS | Status: DC
  Administered 2012-03-08: 09:00:00 via INTRAVENOUS
  Filled 2012-03-08: qty 1000

## 2012-03-08 MED ORDER — LACTATED RINGERS IV SOLN
INTRAVENOUS | Status: DC
  Filled 2012-03-08: qty 1000

## 2012-03-08 SURGICAL SUPPLY — 33 items
BAG DRAIN URO-CYSTO SKYTR STRL (DRAIN) ×2 IMPLANT
BAG DRN ANRFLXCHMBR STRAP LEK (BAG)
BAG DRN UROCATH (DRAIN) ×1
BAG URINE DRAINAGE (UROLOGICAL SUPPLIES) ×2 IMPLANT
BAG URINE LEG 19OZ MD ST LTX (BAG) IMPLANT
CANISTER SUCT LVC 12 LTR MEDI- (MISCELLANEOUS) ×2 IMPLANT
CATH FOLEY 2WAY SLVR  5CC 20FR (CATHETERS) ×1
CATH FOLEY 2WAY SLVR  5CC 22FR (CATHETERS)
CATH FOLEY 2WAY SLVR  5CC 24FR (CATHETERS) ×1
CATH FOLEY 2WAY SLVR 5CC 20FR (CATHETERS) ×1 IMPLANT
CATH FOLEY 2WAY SLVR 5CC 22FR (CATHETERS) IMPLANT
CATH FOLEY 2WAY SLVR 5CC 24FR (CATHETERS) ×1 IMPLANT
CLOTH BEACON ORANGE TIMEOUT ST (SAFETY) ×2 IMPLANT
DRAPE CAMERA CLOSED 9X96 (DRAPES) ×2 IMPLANT
ELECT BUTTON HF 24-28F 2 30DE (ELECTRODE) IMPLANT
ELECT LOOP MED HF 24F 12D (CUTTING LOOP) IMPLANT
ELECT REM PT RETURN 9FT ADLT (ELECTROSURGICAL) ×2
ELECT RESECT VAPORIZE 12D CBL (ELECTRODE) ×2 IMPLANT
ELECTRODE REM PT RTRN 9FT ADLT (ELECTROSURGICAL) ×1 IMPLANT
EVACUATOR MICROVAS BLADDER (UROLOGICAL SUPPLIES) IMPLANT
GLOVE BIO SURGEON STRL SZ7.5 (GLOVE) ×2 IMPLANT
GLOVE BIO SURGEON STRL SZ8 (GLOVE) ×2 IMPLANT
GLOVE ECLIPSE 7.0 STRL STRAW (GLOVE) ×2 IMPLANT
GLOVE INDICATOR 7.5 STRL GRN (GLOVE) ×2 IMPLANT
GOWN PREVENTION PLUS LG XLONG (DISPOSABLE) ×2 IMPLANT
GOWN STRL REIN XL XLG (GOWN DISPOSABLE) ×2 IMPLANT
HOLDER FOLEY CATH W/STRAP (MISCELLANEOUS) IMPLANT
IV NS IRRIG 3000ML ARTHROMATIC (IV SOLUTION) ×2 IMPLANT
KIT ASPIRATION TUBING (SET/KITS/TRAYS/PACK) IMPLANT
PACK CYSTOSCOPY (CUSTOM PROCEDURE TRAY) ×2 IMPLANT
PLUG CATH AND CAP STER (CATHETERS) ×2 IMPLANT
SET ASPIRATION TUBING (TUBING) IMPLANT
WATER STERILE IRR 3000ML UROMA (IV SOLUTION) ×2 IMPLANT

## 2012-03-08 NOTE — Op Note (Signed)
PATIENT:  Gabriela Smith  PRE-OPERATIVE DIAGNOSIS: Bladder tumor  POST-OPERATIVE DIAGNOSIS: Same  PROCEDURE:  Procedure(s): TRANSURETHRAL RESECTION OF BLADDER TUMORS (TURBT) (1.5cm.)  SURGEON:  Surgeon(s): Garnett Farm  ANESTHESIA:   General  EBL:  Minimal  DRAINS: Urinary Catheter (20 Fr. Foley)   SPECIMEN:  Source of Specimen:  Bladder tumor  DISPOSITION OF SPECIMEN:  PATHOLOGY  Indication: Mrs. Pierron is a 71 year old female with a history of transitional cell carcinoma of the bladder. She was found to have a very large tumor involving the right wall the bladder in 1/13. The tumor was fully resected and noted to be a Ta,G3 lesion. She underwent repeat resection of a recurrent tumor in 3/13 with the same pathology after which she underwent a 6 week induction course of BCG which was completed and 5/13. She was found to have several superficial-appearing papillary tumors within the bladder on surveillance cystoscopy he is brought back to the operating room for resection and postoperative mitomycin-C.  Description of operation: The patient was taken to the operating room and administered general anesthesia. He was then placed on the table and moved to the dorsal lithotomy position after which his genitalia was sterilely prepped and draped. An official timeout was then performed.  The 22 French cystoscope was then introduced into the bladder and the obturator was removed. The 12 lens was then inserted and the bladder was fully and systematically inspected. Ureteral orifices were noted to be in the normal anatomic positions. The right orifice was slightly irregular do to previous adjacent scarring but was widely patent. I noted 3 tumors on the dome of the bladder and one on the left wall as well as what appeared to be some papillary-appearing mucosa just inside the bladder neck at about the 7:00 position.  I first began using the cold cup biopsy forceps and was able to remove all of the  tumors at the dome and on the left wall. They all appeared to be quite superficial. I then used the Bugbee to fulgurate the biopsy sites. I fulgurated the biopsy site as well as the surrounding mucosa at each of the sites and then I fulgurated, quite thoroughly, the area I noted inside the bladder neck on the right-hand side. Reinspection revealed no bleeding, perforation or other worrisome areas of mucosa. I therefore drained the bladder and removed the cystoscope.  A 20 French Foley catheter was then inserted in the bladder and irrigated. The irrigant returned clear. The patient was awakened and taken to the recovery room.  While in the recovery room 40 mg of mitomycin-C in 40 cc of water were instilled in the bladder through the catheter and the catheter was plugged. This will remain indwelling for approximately one hour. It will then be drained from the bladder and the catheter will be removed and the patient discharged home.  PLAN OF CARE: Discharge to home after PACU  PATIENT DISPOSITION:  PACU - hemodynamically stable.

## 2012-03-08 NOTE — Transfer of Care (Signed)
Immediate Anesthesia Transfer of Care Note  Patient: Gabriela Smith  Procedure(s) Performed: Procedure(s) (LRB) with comments: TRANSURETHRAL RESECTION OF BLADDER TUMOR (TURBT) (N/A)  Patient Location: PACU  Anesthesia Type:General  Level of Consciousness: awake, alert  and oriented  Airway & Oxygen Therapy: Patient Spontanous Breathing and Patient connected to nasal cannula oxygen  Post-op Assessment:stable, report given   Post vital signs: stable   Complications: No apparent anesthesia complications

## 2012-03-08 NOTE — Anesthesia Postprocedure Evaluation (Signed)
  Anesthesia Post-op Note  Patient: Gabriela Smith  Procedure(s) Performed: Procedure(s) (LRB): TRANSURETHRAL RESECTION OF BLADDER TUMOR (TURBT) (N/A)  Patient Location: PACU  Anesthesia Type: General  Level of Consciousness: awake and alert   Airway and Oxygen Therapy: Patient Spontanous Breathing  Post-op Pain: mild  Post-op Assessment: Post-op Vital signs reviewed, Patient's Cardiovascular Status Stable, Respiratory Function Stable, Patent Airway and No signs of Nausea or vomiting  Last Vitals:  Filed Vitals:   03/08/12 1015  BP: 182/82  Pulse: 79  Temp: 36.4 C  Resp: 15    Post-op Vital Signs: stable   Complications: No apparent anesthesia complications

## 2012-03-08 NOTE — Interval H&P Note (Signed)
History and Physical Interval Note:  03/08/2012 9:27 AM  Gabriela Smith  has presented today for surgery, with the diagnosis of Bladder Tumors  The various methods of treatment have been discussed with the patient and family. After consideration of risks, benefits and other options for treatment, the patient has consented to  Procedure(s) (LRB) with comments: TRANSURETHRAL RESECTION OF BLADDER TUMOR (TURBT) (N/A) as a surgical intervention .  The patient's history has been reviewed, patient examined, no change in status, stable for surgery.  I have reviewed the patient's chart and labs.  Questions were answered to the patient's satisfaction.     Garnett Farm

## 2012-03-08 NOTE — Anesthesia Preprocedure Evaluation (Addendum)
Anesthesia Evaluation  Patient identified by MRN, date of birth, ID band Patient awake    Reviewed: Allergy & Precautions, H&P , NPO status , Patient's Chart, lab work & pertinent test results  Airway Mallampati: II TM Distance: >3 FB Neck ROM: Full    Dental  (+) Caps and Dental Advisory Given   Pulmonary neg pulmonary ROS,  breath sounds clear to auscultation  Pulmonary exam normal       Cardiovascular Exercise Tolerance: Good hypertension, Pt. on home beta blockers - angina+ CAD Rhythm:Regular Rate:Normal  Angioplasty 1993, no stent.   Neuro/Psych negative neurological ROS  negative psych ROS   GI/Hepatic negative GI ROS, Neg liver ROS,   Endo/Other  negative endocrine ROS  Renal/GU negative Renal ROS  negative genitourinary   Musculoskeletal negative musculoskeletal ROS (+)   Abdominal   Peds negative pediatric ROS (+)  Hematology negative hematology ROS (+)   Anesthesia Other Findings   Reproductive/Obstetrics negative OB ROS                          Anesthesia Physical  Anesthesia Plan  ASA: III  Anesthesia Plan: General   Post-op Pain Management:    Induction: Intravenous  Airway Management Planned: LMA  Additional Equipment:   Intra-op Plan:   Post-operative Plan: Extubation in OR  Informed Consent: I have reviewed the patients History and Physical, chart, labs and discussed the procedure including the risks, benefits and alternatives for the proposed anesthesia with the patient or authorized representative who has indicated his/her understanding and acceptance.   Dental advisory given  Plan Discussed with: CRNA  Anesthesia Plan Comments:         Anesthesia Quick Evaluation

## 2012-03-08 NOTE — Anesthesia Procedure Notes (Signed)
Procedure Name: LMA Insertion Date/Time: 03/08/2012 9:35 AM Performed by: Maris Berger T Pre-anesthesia Checklist: Patient identified, Emergency Drugs available, Suction available and Patient being monitored Patient Re-evaluated:Patient Re-evaluated prior to inductionOxygen Delivery Method: Circle System Utilized Preoxygenation: Pre-oxygenation with 100% oxygen Intubation Type: IV induction Ventilation: Mask ventilation without difficulty LMA: LMA inserted LMA Size: 4.0 Number of attempts: 1 Airway Equipment and Method: bite block Placement Confirmation: positive ETCO2 Dental Injury: Teeth and Oropharynx as per pre-operative assessment

## 2012-03-09 ENCOUNTER — Encounter (HOSPITAL_BASED_OUTPATIENT_CLINIC_OR_DEPARTMENT_OTHER): Payer: Self-pay | Admitting: Urology

## 2012-06-26 ENCOUNTER — Other Ambulatory Visit: Payer: Self-pay | Admitting: Family Medicine

## 2012-06-28 ENCOUNTER — Encounter: Payer: Self-pay | Admitting: *Deleted

## 2012-06-28 NOTE — Telephone Encounter (Signed)
PATIENT WAS SUPPOSE TO CALL BACK IN 1 WEEK FROM 05/17/12 WITH BP READING. NO BP READING DOCUMENTED AS A CALL BACK.

## 2012-08-10 ENCOUNTER — Encounter: Payer: Self-pay | Admitting: Nurse Practitioner

## 2012-08-10 ENCOUNTER — Ambulatory Visit (INDEPENDENT_AMBULATORY_CARE_PROVIDER_SITE_OTHER): Payer: Medicare Other | Admitting: Nurse Practitioner

## 2012-08-10 VITALS — BP 146/76 | HR 67 | Temp 97.5°F | Ht 64.0 in | Wt 174.0 lb

## 2012-08-10 DIAGNOSIS — Z01419 Encounter for gynecological examination (general) (routine) without abnormal findings: Secondary | ICD-10-CM

## 2012-08-10 DIAGNOSIS — Z0000 Encounter for general adult medical examination without abnormal findings: Secondary | ICD-10-CM

## 2012-08-10 DIAGNOSIS — Z124 Encounter for screening for malignant neoplasm of cervix: Secondary | ICD-10-CM

## 2012-08-10 LAB — POCT URINALYSIS DIPSTICK
Bilirubin, UA: NEGATIVE
Blood, UA: NEGATIVE
Glucose, UA: NEGATIVE
Ketones, UA: NEGATIVE
Nitrite, UA: NEGATIVE
Protein, UA: NEGATIVE
Spec Grav, UA: 1.015
Urobilinogen, UA: NEGATIVE
pH, UA: 6.5

## 2012-08-10 LAB — POCT UA - MICROSCOPIC ONLY
Crystals, Ur, HPF, POC: NEGATIVE
Yeast, UA: NEGATIVE

## 2012-08-10 MED ORDER — SOLIFENACIN SUCCINATE 10 MG PO TABS: 10.0000 mg | ORAL_TABLET | Freq: Every day | ORAL | Status: DC

## 2012-08-10 NOTE — Patient Instructions (Signed)

## 2012-08-10 NOTE — Progress Notes (Signed)
  Subjective:    Patient ID: Gabriela Smith, female    DOB: 1940/10/13, 72 y.o.   MRN: [id number]  HPI Regular patient of Dr.Moore- here today for PAP and breast exam only- Doing well without complaints.    Review of Systems  All other systems reviewed and are negative.       Objective:   Physical Exam  Constitutional: She is oriented to person, place, and time. She appears well-developed and well-nourished.  HENT:  Head: Normocephalic.  Right Ear: Hearing, tympanic membrane, external ear and ear canal normal.  Left Ear: Hearing, tympanic membrane, external ear and ear canal normal.  Nose: Nose normal.  Mouth/Throat: Uvula is midline and oropharynx is clear and moist.  Eyes: Conjunctivae and EOM are normal. Pupils are equal, round, and reactive to light.  Neck: Normal range of motion and full passive range of motion without pain. Neck supple. No JVD present. Carotid bruit is not present. No mass and no thyromegaly present.  Cardiovascular: Normal rate, normal heart sounds and intact distal pulses.   No murmur heard. Pulmonary/Chest: Effort normal and breath sounds normal. Right breast exhibits no inverted nipple, no mass, no nipple discharge, no skin change and no tenderness. Left breast exhibits no inverted nipple, no mass, no nipple discharge, no skin change and no tenderness.  Abdominal: Soft. Bowel sounds are normal. She exhibits no mass. There is no tenderness.  Genitourinary: Vagina normal and uterus normal. No breast swelling, tenderness, discharge or bleeding.  bimanual exam-No adnexal masses or tenderness.  Cervical stenosis   Musculoskeletal: Normal range of motion.  Lymphadenopathy:    She has no cervical adenopathy.  Neurological: She is alert and oriented to person, place, and time.  Skin: Skin is warm and dry.  Psychiatric: She has a normal mood and affect. Her behavior is normal. Judgment and thought content normal.    BP 146/76  Pulse 67  Temp(Src) 97.5 F  (36.4 C) (Oral)  Ht 5\' 4"  (1.626 m)  Wt 174 lb (78.926 kg)  BMI 29.85 kg/m2       Assessment & Plan:   1. Annual physical exam   2. Encounter for routine gynecological examination    Orders Placed This Encounter  Procedures  . POCT urinalysis dipstick  . POCT UA - Microscopic Only   Meds ordered this encounter  Medications  . glucosamine-chondroitin 500-400 MG tablet    Sig: Take 1 tablet by mouth 3 (three) times daily.  . Flaxseed, Linseed, (FLAX SEED OIL) 1000 MG CAPS    Sig: Take by mouth.  . co-enzyme Q-10 50 MG capsule    Sig: Take 50 mg by mouth daily.  [name] b complex vitamins capsule    Sig: Take 1 capsule by mouth daily.  . cholecalciferol (VITAMIN D) 1000 UNITS tablet    Sig: Take 1,000 Units by mouth daily.  . solifenacin (VESICARE) 10 MG tablet    Sig: Take 1 tablet (10 mg total) by mouth daily.    Dispense:  30 tablet    Refill:  2    Order Specific Question:  Supervising Provider    Answer:  Ernestina Penna [[id number]]   Keep follow-up appointment with Dr. Cristela Felt, FNP

## 2012-08-11 ENCOUNTER — Other Ambulatory Visit: Payer: Self-pay | Admitting: Nurse Practitioner

## 2012-08-11 LAB — PAP IG (IMAGE GUIDED)

## 2012-08-11 MED ORDER — CIPROFLOXACIN HCL 500 MG PO TABS: 500.0000 mg | ORAL_TABLET | Freq: Two times a day (BID) | ORAL | Status: DC

## 2012-09-18 ENCOUNTER — Other Ambulatory Visit: Payer: Self-pay | Admitting: Family Medicine

## 2012-09-27 ENCOUNTER — Other Ambulatory Visit: Payer: Self-pay | Admitting: Family Medicine

## 2012-11-01 ENCOUNTER — Other Ambulatory Visit: Payer: Self-pay

## 2012-11-01 DIAGNOSIS — Z01818 Encounter for other preprocedural examination: Secondary | ICD-10-CM

## 2012-11-04 ENCOUNTER — Ambulatory Visit (INDEPENDENT_AMBULATORY_CARE_PROVIDER_SITE_OTHER): Payer: Medicare Other | Admitting: Family Medicine

## 2012-11-04 ENCOUNTER — Encounter (HOSPITAL_COMMUNITY): Payer: Self-pay | Admitting: Pharmacy Technician

## 2012-11-04 ENCOUNTER — Ambulatory Visit (INDEPENDENT_AMBULATORY_CARE_PROVIDER_SITE_OTHER): Payer: Medicare Other

## 2012-11-04 ENCOUNTER — Encounter: Payer: Self-pay | Admitting: Family Medicine

## 2012-11-04 VITALS — BP 130/78 | HR 68 | Temp 97.3°F | Ht 62.5 in | Wt 173.0 lb

## 2012-11-04 DIAGNOSIS — R5383 Other fatigue: Secondary | ICD-10-CM

## 2012-11-04 DIAGNOSIS — M1710 Unilateral primary osteoarthritis, unspecified knee: Secondary | ICD-10-CM

## 2012-11-04 DIAGNOSIS — IMO0002 Reserved for concepts with insufficient information to code with codable children: Secondary | ICD-10-CM

## 2012-11-04 DIAGNOSIS — I10 Essential (primary) hypertension: Secondary | ICD-10-CM

## 2012-11-04 DIAGNOSIS — M545 Low back pain, unspecified: Secondary | ICD-10-CM

## 2012-11-04 DIAGNOSIS — M1712 Unilateral primary osteoarthritis, left knee: Secondary | ICD-10-CM | POA: Insufficient documentation

## 2012-11-04 DIAGNOSIS — C679 Malignant neoplasm of bladder, unspecified: Secondary | ICD-10-CM | POA: Insufficient documentation

## 2012-11-04 DIAGNOSIS — E785 Hyperlipidemia, unspecified: Secondary | ICD-10-CM

## 2012-11-04 DIAGNOSIS — G629 Polyneuropathy, unspecified: Secondary | ICD-10-CM

## 2012-11-04 DIAGNOSIS — Z8551 Personal history of malignant neoplasm of bladder: Secondary | ICD-10-CM

## 2012-11-04 DIAGNOSIS — E559 Vitamin D deficiency, unspecified: Secondary | ICD-10-CM

## 2012-11-04 DIAGNOSIS — R209 Unspecified disturbances of skin sensation: Secondary | ICD-10-CM

## 2012-11-04 DIAGNOSIS — R200 Anesthesia of skin: Secondary | ICD-10-CM

## 2012-11-04 DIAGNOSIS — I2510 Atherosclerotic heart disease of native coronary artery without angina pectoris: Secondary | ICD-10-CM

## 2012-11-04 DIAGNOSIS — R5381 Other malaise: Secondary | ICD-10-CM

## 2012-11-04 DIAGNOSIS — G609 Hereditary and idiopathic neuropathy, unspecified: Secondary | ICD-10-CM

## 2012-11-04 DIAGNOSIS — Z23 Encounter for immunization: Secondary | ICD-10-CM

## 2012-11-04 DIAGNOSIS — R0989 Other specified symptoms and signs involving the circulatory and respiratory systems: Secondary | ICD-10-CM

## 2012-11-04 HISTORY — DX: Unilateral primary osteoarthritis, left knee: M17.12

## 2012-11-04 HISTORY — DX: Malignant neoplasm of bladder, unspecified: C67.9

## 2012-11-04 LAB — POCT CBC
Granulocyte percent: 59 %G (ref 37–80)
HCT, POC: 38.3 % (ref 37.7–47.9)
Hemoglobin: 13.1 g/dL (ref 12.2–16.2)
Lymph, poc: 1.8 (ref 0.6–3.4)
MCH, POC: 31.5 pg — AB (ref 27–31.2)
MCHC: 34.1 g/dL (ref 31.8–35.4)
MCV: 92.4 fL (ref 80–97)
MPV: 7.6 fL (ref 0–99.8)
POC Granulocyte: 3.1 (ref 2–6.9)
POC LYMPH PERCENT: 33.5 %L (ref 10–50)
Platelet Count, POC: 211 10*3/uL (ref 142–424)
RBC: 4.1 M/uL (ref 4.04–5.48)
RDW, POC: 12.5 %
WBC: 5.3 10*3/uL (ref 4.6–10.2)

## 2012-11-04 NOTE — Patient Instructions (Addendum)
Fall precautions discussed Continue current meds and therapeutic lifestyle changes Return to clinic in September or October for a flu shot Arterial Dopplers of both extremities will be arranged prior to the knee surgery by Dr. Charlann Boxer LS-spine films will be obtained today because of peripheral neuropathy and low back pain Once the arterial Dopplers are obtained we will be able to give permission for surgery

## 2012-11-04 NOTE — Progress Notes (Signed)
Subjective:    Patient ID: Gabriela Smith, female    DOB: 08/12/1940, 72 y.o.   MRN: [id number]  HPI Patient returns to clinic today for followup and management of chronic medical problems. Of most importance she has a history of many allergies to many different medications. Her problems include hypertension, hyperlipidemia, coronary artery disease, arthritis, and a history of bladder cancer. She is planning left knee replacement in mid September. The patient will have a spinal for the surgery. Patient brings her home blood pressure readings in and the majority of these are good and within range. The readings are averaging in the 130s over the 60-70 range. The patient complains of burning and stinging in her feet especially with arising in the morning. She also complains of back pain.   Review of Systems  Constitutional: Positive for fatigue (slight).  HENT: Negative for ear pain, congestion, sore throat, sneezing and postnasal drip.   Eyes: Negative.  Negative for pain, discharge, redness, itching and visual disturbance.  Respiratory: Negative.  Negative for cough, choking, chest tightness, shortness of breath and wheezing.   Cardiovascular: Positive for leg swelling (L>R).  Gastrointestinal: Negative.  Negative for abdominal pain, diarrhea, constipation and blood in stool.  Endocrine: Negative.  Negative for cold intolerance, heat intolerance, polydipsia and polyphagia.  Genitourinary: Positive for frequency. Negative for dysuria, vaginal bleeding and vaginal discharge.  Musculoskeletal: Positive for back pain (intermitent LBP) and arthralgias (L knee). Negative for myalgias.  Skin: Negative.  Negative for color change, pallor, rash and wound.  Allergic/Immunologic: Negative.  Negative for environmental allergies.  Neurological: Positive for numbness (bilarteral feet). Negative for dizziness, tremors, weakness and headaches.  Psychiatric/Behavioral: Positive for sleep disturbance (nightly) and  decreased concentration (slight memory deficit). Negative for confusion and agitation. The patient is not nervous/anxious.        Objective:   Physical Exam BP 130/78  Pulse 68  Temp(Src) 97.3 F (36.3 C) (Oral)  Ht 5' 2.5" (1.588 m)  Wt 173 lb (78.472 kg)  BMI 31.12 kg/m2  The patient appeared well nourished and normally developed, alert and oriented to time and place. Speech, behavior and judgement appear normal. Vital signs as documented.  Head exam is unremarkable. No scleral icterus or pallor noted. Ears nose and throat were normal.  Neck is without jugular venous distension, thyromegally, or carotid bruits. Carotid upstrokes are brisk bilaterally. No cervical adenopathy. Lungs are clear anteriorly and posteriorly to auscultation. Normal respiratory effort. Cardiac exam reveals regular rate and rhythm at 84 per minute. First and second heart sounds normal.  No murmurs, rubs or gallops.  Abdominal exam reveals normal bowl sounds, no masses, no organomegaly and no aortic enlargement. No inguinal adenopathy. Extremities are nonedematous and both femoral and pedal pulses are diminished bilaterally. Extremities were warm to touch. Skin without pallor or jaundice.  Warm and dry, without rash. Neurologic exam reveals normal deep tendon reflexes and normal sensation.  .[facility] reading (PRIMARY) by  Dr. Christell Constant: LS spine:  Degenerative changes, osteopenia                                   Assessment & Plan:  1. Hypertension - BMP8+EGFR; Standing  2. Hyperlipidemia - Hepatic function panel; Standing - NMR, lipoprofile; Standing  3. CORONARY ATHEROSCLEROSIS NATIVE CORONARY ARTERY -Patient currently having no chest pain or chest discomfort  4. History of bladder cancer  5. Osteoarthritis of left knee -Pending surgery Dr.  Olin  6. Fatigue - POCT CBC; Standing - Thyroid Panel With TSH  7. Vitamin D deficiency - Vit D  25 hydroxy (rtn osteoporosis monitoring); Standing  8.  Diminished pulses in lower extremity - Lower Extremity Arterial Doppler Bilateral; Future  9. Bilateral leg numbness - DG Lumbar Spine 2-3 Views  10. Low back pain  11. Peripheral neuropathy -LS spine films today  Patient Instructions  Fall precautions discussed Continue current meds and therapeutic lifestyle changes Return to clinic in September or October for a flu shot Arterial Dopplers of both extremities will be arranged prior to the knee surgery by Dr. Charlann Boxer LS-spine films will be obtained today because of peripheral neuropathy and low back pain Once the arterial Dopplers are obtained we will be able to give permission for surgery   Nyra Capes MD

## 2012-11-05 LAB — BMP8+EGFR
BUN/Creatinine Ratio: 19 (ref 11–26)
BUN: 14 mg/dL (ref 8–27)
CO2: 27 mmol/L (ref 18–29)
Calcium: 9.8 mg/dL (ref 8.6–10.2)
Chloride: 100 mmol/L (ref 97–108)
Creatinine, Ser: 0.75 mg/dL (ref 0.57–1.00)
GFR calc Af Amer: 91 mL/min/{1.73_m2} (ref >59)
GFR calc non Af Amer: 79 mL/min/{1.73_m2} (ref >59)
Glucose: 95 mg/dL (ref 65–99)
Potassium: 4.6 mmol/L (ref 3.5–5.2)
Sodium: 140 mmol/L (ref 134–144)

## 2012-11-05 LAB — THYROID PANEL WITH TSH
Free Thyroxine Index: 1.5 (ref 1.2–4.9)
T3 Uptake Ratio: 29 % (ref 24–39)
T4, Total: 5.1 ug/dL (ref 4.5–12.0)
TSH: 2.68 u[IU]/mL (ref 0.450–4.500)

## 2012-11-05 LAB — NMR, LIPOPROFILE
Cholesterol: 333 mg/dL — ABNORMAL HIGH (ref <200)
HDL Cholesterol by NMR: 58 mg/dL (ref >=40)
HDL Particle Number: 39.6 umol/L (ref >=30.5)
LDL Particle Number: 3086 nmol/L — ABNORMAL HIGH (ref <1000)
LDL Size: 21.3 nm (ref >20.5)
LDLC SERPL CALC-MCNC: 229 mg/dL — ABNORMAL HIGH (ref <100)
LP-IR Score: 60 — ABNORMAL HIGH (ref <=45)
Small LDL Particle Number: 1355 nmol/L — ABNORMAL HIGH (ref <=527)
Triglycerides by NMR: 230 mg/dL — ABNORMAL HIGH (ref <150)

## 2012-11-05 LAB — HEPATIC FUNCTION PANEL
ALT: 16 IU/L (ref 0–32)
AST: 15 IU/L (ref 0–40)
Albumin: 4.6 g/dL (ref 3.5–4.8)
Alkaline Phosphatase: 68 IU/L (ref 39–117)
Bilirubin, Direct: 0.06 mg/dL (ref 0.00–0.40)
Total Bilirubin: 0.4 mg/dL (ref 0.0–1.2)
Total Protein: 7.1 g/dL (ref 6.0–8.5)

## 2012-11-05 LAB — VITAMIN D 25 HYDROXY (VIT D DEFICIENCY, FRACTURES): Vit D, 25-Hydroxy: 29.9 ng/mL — ABNORMAL LOW (ref 30.0–100.0)

## 2012-11-09 ENCOUNTER — Telehealth: Payer: Self-pay | Admitting: Family Medicine

## 2012-11-11 NOTE — Telephone Encounter (Signed)
Patient aware.

## 2012-11-15 ENCOUNTER — Other Ambulatory Visit: Payer: Self-pay

## 2012-11-15 ENCOUNTER — Ambulatory Visit (HOSPITAL_COMMUNITY)
Admission: RE | Admit: 2012-11-15 | Discharge: 2012-11-15 | Disposition: A | Discharge status: Home / Self Care | Payer: Medicare Other | Source: Ambulatory Visit | Attending: Orthopedic Surgery | Admitting: Orthopedic Surgery

## 2012-11-15 ENCOUNTER — Encounter (HOSPITAL_COMMUNITY)
Admission: RE | Admit: 2012-11-15 | Discharge: 2012-11-15 | Disposition: A | Discharge status: Home / Self Care | Payer: Medicare Other | Source: Ambulatory Visit | Attending: Orthopedic Surgery | Admitting: Orthopedic Surgery

## 2012-11-15 ENCOUNTER — Encounter (HOSPITAL_COMMUNITY): Payer: Self-pay

## 2012-11-15 ENCOUNTER — Telehealth: Payer: Self-pay | Admitting: Family Medicine

## 2012-11-15 DIAGNOSIS — I2510 Atherosclerotic heart disease of native coronary artery without angina pectoris: Secondary | ICD-10-CM | POA: Insufficient documentation

## 2012-11-15 DIAGNOSIS — Z8551 Personal history of malignant neoplasm of bladder: Secondary | ICD-10-CM | POA: Insufficient documentation

## 2012-11-15 DIAGNOSIS — Z01818 Encounter for other preprocedural examination: Secondary | ICD-10-CM | POA: Insufficient documentation

## 2012-11-15 DIAGNOSIS — I10 Essential (primary) hypertension: Secondary | ICD-10-CM | POA: Insufficient documentation

## 2012-11-15 DIAGNOSIS — R0989 Other specified symptoms and signs involving the circulatory and respiratory systems: Secondary | ICD-10-CM

## 2012-11-15 DIAGNOSIS — M1710 Unilateral primary osteoarthritis, unspecified knee: Secondary | ICD-10-CM | POA: Insufficient documentation

## 2012-11-15 DIAGNOSIS — Z01810 Encounter for preprocedural cardiovascular examination: Secondary | ICD-10-CM | POA: Insufficient documentation

## 2012-11-15 DIAGNOSIS — Z01812 Encounter for preprocedural laboratory examination: Secondary | ICD-10-CM | POA: Insufficient documentation

## 2012-11-15 LAB — CBC
HCT: 39 % (ref 36.0–46.0)
Hemoglobin: 13.1 g/dL (ref 12.0–15.0)
MCH: 31.3 pg (ref 26.0–34.0)
MCHC: 33.6 g/dL (ref 30.0–36.0)
MCV: 93.1 fL (ref 78.0–100.0)
Platelets: 239 10*3/uL (ref 150–400)
RBC: 4.19 MIL/uL (ref 3.87–5.11)
RDW: 12.4 % (ref 11.5–15.5)
WBC: 5.9 10*3/uL (ref 4.0–10.5)

## 2012-11-15 LAB — URINALYSIS, ROUTINE W REFLEX MICROSCOPIC
Bilirubin Urine: NEGATIVE
Glucose, UA: NEGATIVE mg/dL
Hgb urine dipstick: NEGATIVE
Ketones, ur: NEGATIVE mg/dL
Nitrite: NEGATIVE
Protein, ur: NEGATIVE mg/dL
Specific Gravity, Urine: 1.012 (ref 1.005–1.030)
Urobilinogen, UA: 0.2 mg/dL (ref 0.0–1.0)
pH: 6.5 (ref 5.0–8.0)

## 2012-11-15 LAB — SURGICAL PCR SCREEN
MRSA, PCR: NEGATIVE
Staphylococcus aureus: NEGATIVE

## 2012-11-15 LAB — BASIC METABOLIC PANEL
BUN: 18 mg/dL (ref 6–23)
CO2: 27 mEq/L (ref 19–32)
Calcium: 9.7 mg/dL (ref 8.4–10.5)
Chloride: 101 mEq/L (ref 96–112)
Creatinine, Ser: 0.77 mg/dL (ref 0.50–1.10)
GFR calc Af Amer: >90 mL/min (ref >90)
GFR calc non Af Amer: 81 mL/min — ABNORMAL LOW (ref >90)
Glucose, Bld: 104 mg/dL — ABNORMAL HIGH (ref 70–99)
Potassium: 4.4 mEq/L (ref 3.5–5.1)
Sodium: 137 mEq/L (ref 135–145)

## 2012-11-15 LAB — URINE MICROSCOPIC-ADD ON

## 2012-11-15 LAB — ABO/RH: ABO/RH(D): O NEG

## 2012-11-15 LAB — APTT: aPTT: 40 seconds — ABNORMAL HIGH (ref 24–37)

## 2012-11-15 LAB — PROTIME-INR
INR: 0.9 (ref 0.00–1.49)
Prothrombin Time: 12 seconds (ref 11.6–15.2)

## 2012-11-15 NOTE — Pre-Procedure Instructions (Signed)
EKG AND CXR WERE DONE TODAY - PREOP - AT [facility].  PT'S UA, URINE MICROSCOPIC, URINE CULTURE IN PROCESS, PTT 40 FAXED TO DR. Nilsa Nutting OFFICE FOR REVIEW.

## 2012-11-15 NOTE — Patient Instructions (Signed)
YOUR SURGERY IS SCHEDULED AT [facility]  ON:   Monday  9/15  REPORT TO [place] [facility] AT:  10:15 AM      PHONE # FOR SHORT STAY IS [phone]  DO NOT EAT ANYTHING AFTER MIDNIGHT THE NIGHT BEFORE YOUR SURGERY.   NO FOOD, NO CHEWING GUM, NO MINTS, NO CANDIES, NO CHEWING TOBACCO. YOU MAY HAVE CLEAR LIQUIDS TO DRINK FROM MIDNIGHT UNTIL 6:30 AM DAY OF SURGERY - LIKE WATER, GINGERALE.    NOTHING TO DRINK AFTER 6:30 AM DAY OF SURGERY.  PLEASE TAKE THE FOLLOWING MEDICATIONS THE AM OF YOUR SURGERY WITH A FEW SIPS OF WATER:   METOPROLOL   DO NOT BRING VALUABLES, MONEY, CREDIT CARDS.  DO NOT WEAR JEWELRY, MAKE-UP, NAIL POLISH AND NO METAL PINS OR CLIPS IN YOUR HAIR. CONTACT LENS, DENTURES / PARTIALS, GLASSES SHOULD NOT BE WORN TO SURGERY AND IN MOST CASES-HEARING AIDS WILL NEED TO BE REMOVED.  BRING YOUR GLASSES CASE, ANY EQUIPMENT NEEDED FOR YOUR CONTACT LENS. FOR PATIENTS ADMITTED TO THE HOSPITAL--CHECK OUT TIME THE DAY OF DISCHARGE IS 11:00 AM.  ALL INPATIENT ROOMS ARE PRIVATE - WITH BATHROOM, TELEPHONE, TELEVISION AND WIFI INTERNET.                             PLEASE READ OVER ANY  FACT SHEETS THAT YOU WERE GIVEN: MRSA INFORMATION, BLOOD TRANSFUSION INFORMATION, INCENTIVE SPIROMETER INFORMATION. FAILURE TO FOLLOW THESE INSTRUCTIONS MAY RESULT IN THE CANCELLATION OF YOUR SURGERY.   PATIENT SIGNATURE_________________________________

## 2012-11-16 ENCOUNTER — Other Ambulatory Visit: Payer: Self-pay | Admitting: Family Medicine

## 2012-11-16 ENCOUNTER — Other Ambulatory Visit: Payer: Self-pay | Admitting: *Deleted

## 2012-11-16 DIAGNOSIS — R0989 Other specified symptoms and signs involving the circulatory and respiratory systems: Secondary | ICD-10-CM

## 2012-11-16 LAB — URINE CULTURE: Colony Count: 6000

## 2012-11-16 NOTE — Telephone Encounter (Signed)
Order was placed last week but was ordered as a vascular study instead of an imaging study.   This was brought to my attention yesterday when I called the patient to f/u. The order was corrected and appt was scheduled by the referral department today.  Patient is aware.

## 2012-11-17 NOTE — H&P (Signed)
TOTAL KNEE ADMISSION H&P  Patient is being admitted for left total knee arthroplasty.  Subjective:  Chief Complaint:  Left knee OA / pain.  HPI: Gabriela Smith, 72 y.o. female, has a history of pain and functional disability in the left knee due to arthritis and has failed non-surgical conservative treatments for greater than 12 weeks to include NSAID's and/or analgesics, corticosteriod injections and activity modification.  Onset of symptoms was gradual, starting 1+ years ago with rapidlly worsening course since that time. The patient noted no past surgery on the left knee(s).  Patient currently rates pain in the left knee(s) at 10 out of 10 with activity. Patient has worsening of pain with activity and weight bearing, pain that interferes with activities of daily living, pain with passive range of motion, crepitus and joint swelling.  Patient has evidence of periarticular osteophytes and joint space narrowing by imaging studies.  There is no active infection.  Risks, benefits and expectations were discussed with the patient. Patient understand the risks, benefits and expectations and wishes to proceed with surgery.   D/C Plans:   Home with HHPT  Post-op Meds:    No Rx given   Tranexamic Acid:   Not to be given - CAD  Decadron:    To be given  FYI:    APAP post-op  Dilaudid & tramadol prn, depending on severity   Patient Active Problem List   Diagnosis Date Noted  . History of bladder cancer 11/04/2012  . Osteoarthritis of left knee 11/04/2012  . Hyperlipidemia 11/28/2008  . Hypertension 11/28/2008  . CORONARY ATHEROSCLEROSIS NATIVE CORONARY ARTERY 11/28/2008   Past Medical History  Diagnosis Date  . Hypertension 1993  . Hyperlipidemia 1993  . CAD (coronary artery disease) CARDIOLOGIST- [facility]--  VISIT 01-02-10 IN EPIC    1993-- PTCA. Pt describes a near total blockage of apparently the LAD and a 65% stenosis elsewhere.  . Frequency of urination   . Nocturia   . History of  bladder cancer followed by dr Vernie Ammons  . Diverticulosis   . Osteopenia   . Shortness of breath     ONLY WITH EXERTION  . Arthritis     OA lt knee- cortizone inj. q 4 months AND OA ALSO IN BACK    Past Surgical History  Procedure Laterality Date  . Ptca  1993    LAD  . Umbilical hernia repair  12 YRS AGO  . Cholecystectomy  10 YRS AGO  . Transurethral resection of bladder tumor  03/14/2011    Procedure: TRANSURETHRAL RESECTION OF BLADDER TUMOR (TURBT);  Surgeon: Garnett Farm, MD;  Location: [facility];  Service: Urology;  Laterality: N/A;  . Cystoscopy with biopsy  05/09/2011    Procedure: CYSTOSCOPY WITH BIOPSY;  Surgeon: Garnett Farm, MD;  Location: [facility];  Service: Urology;  Laterality: N/A;  WITH BLADDER BIOPSY GYRUS  . Transurethral resection of bladder tumor  05/09/2011    Procedure: TRANSURETHRAL RESECTION OF BLADDER TUMOR (TURBT);  Surgeon: Garnett Farm, MD;  Location: [facility];  Service: Urology;  Laterality: N/A;  . Transurethral resection of bladder tumor  03/08/2012    Procedure: TRANSURETHRAL RESECTION OF BLADDER TUMOR (TURBT);  Surgeon: Garnett Farm, MD;  Location[facility];  Service: Urology;  Laterality: N/A;    No prescriptions prior to admission   Allergies  Allergen Reactions  . Crestor [Rosuvastatin]   . Fish Allergy   . Lasix [Furosemide]  Edema, elevated BP  . Lipitor [Atorvastatin]   . Macrodantin [Nitrofurantoin Macrocrystal]   . Niaspan [Niacin Er]   . Pravastatin   . Sulfa Antibiotics   . Vicodin [Hydrocodone-Acetaminophen] Other (See Comments)    SEVERE N/V  . Zetia [Ezetimibe]   . Zocor [Simvastatin]   . Codeine Rash    History  Substance Use Topics  . Smoking status: Former Smoker -- 10 years    Types: Cigarettes    Quit date: 03/09/1976  . Smokeless tobacco: Never Used     Comment: Smoked breifly years ago  . Alcohol Use: 1.8 oz/week    3 Glasses of wine per  week     Comment: Occasionally    Family History  Problem Relation Age of Onset  . Coronary artery disease Father 36  . Heart disease Mother   . Heart disease Sister   . Scoliosis Sister      Review of Systems  Constitutional: Negative.   HENT: Negative.   Eyes: Negative.   Respiratory: Positive for shortness of breath (on exertion).   Cardiovascular: Negative.   Gastrointestinal: Negative.   Genitourinary: Positive for frequency.  Musculoskeletal: Positive for back pain and joint pain.  Skin: Negative.   Neurological: Negative.   Endo/Heme/Allergies: Negative.   Psychiatric/Behavioral: Negative.     Objective:  Physical Exam  Constitutional: She appears well-developed and well-nourished.  HENT:  Head: Normocephalic and atraumatic.  Mouth/Throat: Oropharynx is clear and moist.  Eyes: Pupils are equal, round, and reactive to light.  Neck: Neck supple. No JVD present. No tracheal deviation present. No thyromegaly present.  Cardiovascular: Normal rate, regular rhythm, normal heart sounds and intact distal pulses.   Respiratory: Effort normal and breath sounds normal. No stridor. No respiratory distress. She has no wheezes.  GI: Soft. There is no tenderness. There is no guarding.  Musculoskeletal:       Left knee: She exhibits decreased range of motion, swelling and bony tenderness. She exhibits no effusion, no ecchymosis, no laceration and no erythema. Tenderness found.  Lymphadenopathy:    She has no cervical adenopathy.  Neurological: She is alert.  Skin: Skin is warm and dry.  Psychiatric: She has a normal mood and affect.     Labs:  Estimated body mass index is 29.85 kg/(m^2) as calculated from the following:   Height as of 08/10/12: 5\' 4"  (1.626 m).   Weight as of 08/10/12: 78.926 kg (174 lb).   Imaging Review Plain radiographs demonstrate severe degenerative joint disease of the left knee(s). The overall alignment isneutral. The bone quality appears to be good  for age and reported activity level.  Assessment/Plan:  End stage arthritis, left knee   The patient history, physical examination, clinical judgment of the provider and imaging studies are consistent with end stage degenerative joint disease of the left knee(s) and total knee arthroplasty is deemed medically necessary. The treatment options including medical management, injection therapy arthroscopy and arthroplasty were discussed at length. The risks and benefits of total knee arthroplasty were presented and reviewed. The risks due to aseptic loosening, infection, stiffness, patella tracking problems, thromboembolic complications and other imponderables were discussed. The patient acknowledged the explanation, agreed to proceed with the plan and consent was signed. Patient is being admitted for inpatient treatment for surgery, pain control, PT, OT, prophylactic antibiotics, VTE prophylaxis, progressive ambulation and ADL's and discharge planning. The patient is planning to be discharged home with home health services.     Anastasio Auerbach Twanna Resh   PAC  11/17/2012, 8:07 PM

## 2012-11-18 ENCOUNTER — Ambulatory Visit
Admission: RE | Admit: 2012-11-18 | Discharge: 2012-11-18 | Disposition: A | Discharge status: Home / Self Care | Payer: Medicare Other | Source: Ambulatory Visit | Attending: Family Medicine | Admitting: Family Medicine

## 2012-11-18 DIAGNOSIS — R0989 Other specified symptoms and signs involving the circulatory and respiratory systems: Secondary | ICD-10-CM

## 2012-11-18 NOTE — Pre-Procedure Instructions (Signed)
URINE CULTURE RESULTS FAXED TO DR. Nilsa Nutting OFFICE-INSIGNIFICANT GROWTH

## 2012-11-22 ENCOUNTER — Inpatient Hospital Stay (HOSPITAL_COMMUNITY)
Admission: RE | Admit: 2012-11-22 | Discharge: 2012-11-24 | DRG: 470 | Disposition: A | Discharge status: Home / Self Care | Payer: Medicare Other | Source: Ambulatory Visit | Attending: Orthopedic Surgery | Admitting: Orthopedic Surgery

## 2012-11-22 ENCOUNTER — Encounter (HOSPITAL_COMMUNITY): Payer: Self-pay

## 2012-11-22 ENCOUNTER — Inpatient Hospital Stay (HOSPITAL_COMMUNITY): Payer: Medicare Other

## 2012-11-22 ENCOUNTER — Encounter (HOSPITAL_COMMUNITY): Payer: Self-pay | Admitting: *Deleted

## 2012-11-22 ENCOUNTER — Encounter (HOSPITAL_COMMUNITY)
Admission: RE | Disposition: A | Discharge status: Home / Self Care | Payer: Self-pay | Source: Ambulatory Visit | Attending: Orthopedic Surgery

## 2012-11-22 DIAGNOSIS — Z96659 Presence of unspecified artificial knee joint: Secondary | ICD-10-CM

## 2012-11-22 DIAGNOSIS — Z8551 Personal history of malignant neoplasm of bladder: Secondary | ICD-10-CM

## 2012-11-22 DIAGNOSIS — Z6829 Body mass index (BMI) 29.0-29.9, adult: Secondary | ICD-10-CM

## 2012-11-22 DIAGNOSIS — M1710 Unilateral primary osteoarthritis, unspecified knee: Principal | ICD-10-CM | POA: Diagnosis present

## 2012-11-22 DIAGNOSIS — I2510 Atherosclerotic heart disease of native coronary artery without angina pectoris: Secondary | ICD-10-CM | POA: Diagnosis present

## 2012-11-22 DIAGNOSIS — E785 Hyperlipidemia, unspecified: Secondary | ICD-10-CM | POA: Diagnosis present

## 2012-11-22 DIAGNOSIS — Z96652 Presence of left artificial knee joint: Secondary | ICD-10-CM

## 2012-11-22 DIAGNOSIS — M6580 Other synovitis and tenosynovitis, unspecified site: Secondary | ICD-10-CM | POA: Diagnosis present

## 2012-11-22 DIAGNOSIS — D62 Acute posthemorrhagic anemia: Secondary | ICD-10-CM | POA: Diagnosis not present

## 2012-11-22 DIAGNOSIS — D500 Iron deficiency anemia secondary to blood loss (chronic): Secondary | ICD-10-CM | POA: Diagnosis not present

## 2012-11-22 DIAGNOSIS — I10 Essential (primary) hypertension: Secondary | ICD-10-CM | POA: Diagnosis present

## 2012-11-22 DIAGNOSIS — E663 Overweight: Secondary | ICD-10-CM

## 2012-11-22 HISTORY — DX: Presence of unspecified artificial knee joint: Z96.659

## 2012-11-22 HISTORY — PX: TOTAL KNEE ARTHROPLASTY: SHX125

## 2012-11-22 LAB — TYPE AND SCREEN
ABO/RH(D): O NEG
Antibody Screen: NEGATIVE

## 2012-11-22 SURGERY — ARTHROPLASTY, KNEE, TOTAL
Anesthesia: Spinal | Site: Knee | Laterality: Left | Wound class: Clean

## 2012-11-22 MED ORDER — ACETAMINOPHEN 325 MG PO TABS
650.0000 mg | ORAL_TABLET | Freq: Four times a day (QID) | ORAL | Status: DC | PRN
  Administered 2012-11-24: 08:00:00 650 mg via ORAL
  Filled 2012-11-22: qty 2

## 2012-11-22 MED ORDER — METOPROLOL TARTRATE 25 MG PO TABS
25.0000 mg | ORAL_TABLET | Freq: Two times a day (BID) | ORAL | Status: DC
Start: 2012-11-22 — End: 2012-11-24
  Administered 2012-11-22 – 2012-11-24 (×4): 25 mg via ORAL
  Filled 2012-11-22 (×5): qty 1

## 2012-11-22 MED ORDER — PHENOL 1.4 % MT LIQD: 1.0000 | OROMUCOSAL | Status: DC | PRN

## 2012-11-22 MED ORDER — FENTANYL CITRATE 0.05 MG/ML IJ SOLN
25.0000 ug | INTRAMUSCULAR | Status: DC | PRN
Start: 2012-11-22 — End: 2012-11-22

## 2012-11-22 MED ORDER — TRAMADOL HCL 50 MG PO TABS
50.0000 mg | ORAL_TABLET | Freq: Four times a day (QID) | ORAL | Status: DC | PRN
  Administered 2012-11-22 – 2012-11-24 (×7): 100 mg via ORAL
  Filled 2012-11-22 (×7): qty 2

## 2012-11-22 MED ORDER — METOCLOPRAMIDE HCL 5 MG/ML IJ SOLN
5.0000 mg | Freq: Three times a day (TID) | INTRAMUSCULAR | Status: DC | PRN
  Administered 2012-11-22: 18:00:00 10 mg via INTRAVENOUS
  Filled 2012-11-22: qty 2

## 2012-11-22 MED ORDER — MENTHOL 3 MG MT LOZG: 1.0000 | LOZENGE | OROMUCOSAL | Status: DC | PRN

## 2012-11-22 MED ORDER — KETOROLAC TROMETHAMINE 15 MG/ML IJ SOLN
15.0000 mg | Freq: Four times a day (QID) | INTRAMUSCULAR | Status: AC
  Administered 2012-11-22 – 2012-11-23 (×4): 15 mg via INTRAVENOUS
  Filled 2012-11-22 (×5): qty 1

## 2012-11-22 MED ORDER — DEXAMETHASONE SODIUM PHOSPHATE 10 MG/ML IJ SOLN
10.0000 mg | Freq: Once | INTRAMUSCULAR | Status: AC
  Administered 2012-11-23: 13:00:00 10 mg via INTRAVENOUS
  Filled 2012-11-22: qty 1

## 2012-11-22 MED ORDER — CEFAZOLIN SODIUM-DEXTROSE 2-3 GM-% IV SOLR
2.0000 g | Freq: Four times a day (QID) | INTRAVENOUS | Status: AC
  Administered 2012-11-22 – 2012-11-23 (×2): 2 g via INTRAVENOUS
  Filled 2012-11-22 (×2): qty 50

## 2012-11-22 MED ORDER — METHOCARBAMOL 100 MG/ML IJ SOLN
500.0000 mg | Freq: Four times a day (QID) | INTRAVENOUS | Status: DC | PRN
  Administered 2012-11-22: 500 mg via INTRAVENOUS
  Filled 2012-11-22: qty 5

## 2012-11-22 MED ORDER — BUPIVACAINE-EPINEPHRINE PF 0.25-1:200000 % IJ SOLN
INTRAMUSCULAR | Status: AC
  Filled 2012-11-22: qty 30

## 2012-11-22 MED ORDER — CHLORHEXIDINE GLUCONATE 4 % EX LIQD
60.0000 mL | Freq: Once | CUTANEOUS | Status: DC
  Filled 2012-11-22: qty 60

## 2012-11-22 MED ORDER — ONDANSETRON HCL 4 MG PO TABS
4.0000 mg | ORAL_TABLET | Freq: Four times a day (QID) | ORAL | Status: DC | PRN
  Administered 2012-11-24: 10:00:00 4 mg via ORAL
  Filled 2012-11-22: qty 1

## 2012-11-22 MED ORDER — BUPIVACAINE LIPOSOME 1.3 % IJ SUSP
INTRAMUSCULAR | Status: DC | PRN
  Administered 2012-11-22: 20 mL

## 2012-11-22 MED ORDER — BUPIVACAINE-EPINEPHRINE PF 0.25-1:200000 % IJ SOLN
INTRAMUSCULAR | Status: DC | PRN
  Administered 2012-11-22: 25 mL

## 2012-11-22 MED ORDER — HYDROCHLOROTHIAZIDE 25 MG PO TABS: 12.5000 mg | ORAL_TABLET | Freq: Every day | ORAL | Status: DC

## 2012-11-22 MED ORDER — CEFAZOLIN SODIUM-DEXTROSE 2-3 GM-% IV SOLR
2.0000 g | INTRAVENOUS | Status: AC
  Administered 2012-11-22: 2 g via INTRAVENOUS

## 2012-11-22 MED ORDER — BISACODYL 10 MG RE SUPP: 10.0000 mg | Freq: Every day | RECTAL | Status: DC | PRN

## 2012-11-22 MED ORDER — PROPOFOL INFUSION 10 MG/ML OPTIME
INTRAVENOUS | Status: DC | PRN
  Administered 2012-11-22: 120 ug/kg/min via INTRAVENOUS

## 2012-11-22 MED ORDER — HYDROMORPHONE HCL 2 MG PO TABS
2.0000 mg | ORAL_TABLET | ORAL | Status: DC | PRN
  Filled 2012-11-22: qty 1

## 2012-11-22 MED ORDER — HYDROCHLOROTHIAZIDE 12.5 MG PO CAPS
12.5000 mg | ORAL_CAPSULE | Freq: Every day | ORAL | Status: DC
  Administered 2012-11-22 – 2012-11-24 (×3): 12.5 mg via ORAL
  Filled 2012-11-22 (×3): qty 1

## 2012-11-22 MED ORDER — METOCLOPRAMIDE HCL 10 MG PO TABS: 5.0000 mg | ORAL_TABLET | Freq: Three times a day (TID) | ORAL | Status: DC | PRN

## 2012-11-22 MED ORDER — SODIUM CHLORIDE 0.9 % IV SOLN
INTRAVENOUS | Status: DC
  Administered 2012-11-22 – 2012-11-23 (×2): via INTRAVENOUS
  Filled 2012-11-22 (×7): qty 1000

## 2012-11-22 MED ORDER — ONDANSETRON HCL 4 MG/2ML IJ SOLN
4.0000 mg | Freq: Four times a day (QID) | INTRAMUSCULAR | Status: DC | PRN
  Administered 2012-11-22: 19:00:00 4 mg via INTRAVENOUS
  Filled 2012-11-22: qty 2

## 2012-11-22 MED ORDER — KETOROLAC TROMETHAMINE 30 MG/ML IJ SOLN
INTRAMUSCULAR | Status: AC
  Filled 2012-11-22: qty 1

## 2012-11-22 MED ORDER — METHOCARBAMOL 500 MG PO TABS
500.0000 mg | ORAL_TABLET | Freq: Four times a day (QID) | ORAL | Status: DC | PRN
  Administered 2012-11-22 – 2012-11-24 (×4): 500 mg via ORAL
  Filled 2012-11-22 (×4): qty 1

## 2012-11-22 MED ORDER — PHENYLEPHRINE HCL 10 MG/ML IJ SOLN
10.0000 mg | INTRAVENOUS | Status: DC | PRN
  Administered 2012-11-22: 40 ug/min via INTRAVENOUS

## 2012-11-22 MED ORDER — ZOLPIDEM TARTRATE 5 MG PO TABS: 5.0000 mg | ORAL_TABLET | Freq: Every evening | ORAL | Status: DC | PRN

## 2012-11-22 MED ORDER — DIPHENHYDRAMINE HCL 25 MG PO CAPS: 25.0000 mg | ORAL_CAPSULE | Freq: Four times a day (QID) | ORAL | Status: DC | PRN

## 2012-11-22 MED ORDER — DEXAMETHASONE SODIUM PHOSPHATE 10 MG/ML IJ SOLN: 10.0000 mg | Freq: Once | INTRAMUSCULAR | Status: DC

## 2012-11-22 MED ORDER — BUPIVACAINE LIPOSOME 1.3 % IJ SUSP
20.0000 mL | Freq: Once | INTRAMUSCULAR | Status: DC
  Filled 2012-11-22: qty 20

## 2012-11-22 MED ORDER — BUPIVACAINE IN DEXTROSE 0.75-8.25 % IT SOLN
INTRATHECAL | Status: DC | PRN
  Administered 2012-11-22: 1.6 mL via INTRATHECAL

## 2012-11-22 MED ORDER — SODIUM CHLORIDE 0.9 % IJ SOLN
INTRAMUSCULAR | Status: AC
  Filled 2012-11-22: qty 50

## 2012-11-22 MED ORDER — SODIUM CHLORIDE 0.9 % IJ SOLN
INTRAMUSCULAR | Status: DC | PRN
  Administered 2012-11-22: 14 mL via INTRAVENOUS

## 2012-11-22 MED ORDER — LACTATED RINGERS IV SOLN
INTRAVENOUS | Status: DC
  Administered 2012-11-22: 13:00:00 via INTRAVENOUS
  Administered 2012-11-22: 1000 mL via INTRAVENOUS
  Administered 2012-11-22: 14:00:00 via INTRAVENOUS

## 2012-11-22 MED ORDER — DEXAMETHASONE SODIUM PHOSPHATE 4 MG/ML IJ SOLN
INTRAMUSCULAR | Status: DC | PRN
  Administered 2012-11-22: 10 mg via INTRAVENOUS

## 2012-11-22 MED ORDER — ASPIRIN EC 325 MG PO TBEC
325.0000 mg | DELAYED_RELEASE_TABLET | Freq: Two times a day (BID) | ORAL | Status: DC
  Administered 2012-11-23 – 2012-11-24 (×3): 325 mg via ORAL
  Filled 2012-11-22 (×5): qty 1

## 2012-11-22 MED ORDER — MIDAZOLAM HCL 5 MG/5ML IJ SOLN
INTRAMUSCULAR | Status: DC | PRN
  Administered 2012-11-22 (×2): 1 mg via INTRAVENOUS

## 2012-11-22 MED ORDER — FENTANYL CITRATE 0.05 MG/ML IJ SOLN
INTRAMUSCULAR | Status: DC | PRN
  Administered 2012-11-22 (×2): 50 ug via INTRAVENOUS
  Administered 2012-11-22: 100 ug via INTRAVENOUS
  Administered 2012-11-22: 50 ug via INTRAVENOUS

## 2012-11-22 MED ORDER — ONDANSETRON HCL 4 MG/2ML IJ SOLN
INTRAMUSCULAR | Status: DC | PRN
  Administered 2012-11-22 (×2): 2 mg via INTRAVENOUS

## 2012-11-22 MED ORDER — CEFAZOLIN SODIUM-DEXTROSE 2-3 GM-% IV SOLR
INTRAVENOUS | Status: AC
  Filled 2012-11-22: qty 50

## 2012-11-22 MED ORDER — PROMETHAZINE HCL 25 MG/ML IJ SOLN: 6.2500 mg | INTRAMUSCULAR | Status: DC | PRN

## 2012-11-22 MED ORDER — FLEET ENEMA 7-19 GM/118ML RE ENEM: 1.0000 | ENEMA | Freq: Once | RECTAL | Status: AC | PRN

## 2012-11-22 MED ORDER — POLYETHYLENE GLYCOL 3350 17 G PO PACK
17.0000 g | PACK | Freq: Two times a day (BID) | ORAL | Status: DC
  Administered 2012-11-22 – 2012-11-23 (×2): 17 g via ORAL

## 2012-11-22 MED ORDER — FERROUS SULFATE 325 (65 FE) MG PO TABS
325.0000 mg | ORAL_TABLET | Freq: Three times a day (TID) | ORAL | Status: DC
  Administered 2012-11-23 – 2012-11-24 (×4): 325 mg via ORAL
  Filled 2012-11-22 (×8): qty 1

## 2012-11-22 MED ORDER — ACETAMINOPHEN 650 MG RE SUPP: 650.0000 mg | Freq: Four times a day (QID) | RECTAL | Status: DC | PRN

## 2012-11-22 MED ORDER — ALUM & MAG HYDROXIDE-SIMETH 200-200-20 MG/5ML PO SUSP: 30.0000 mL | ORAL | Status: DC | PRN

## 2012-11-22 MED ORDER — MEPERIDINE HCL 50 MG/ML IJ SOLN: 6.2500 mg | INTRAMUSCULAR | Status: DC | PRN

## 2012-11-22 MED ORDER — DOCUSATE SODIUM 100 MG PO CAPS
100.0000 mg | ORAL_CAPSULE | Freq: Two times a day (BID) | ORAL | Status: DC
  Administered 2012-11-22 – 2012-11-23 (×3): 100 mg via ORAL

## 2012-11-22 MED ORDER — HYDROMORPHONE HCL PF 1 MG/ML IJ SOLN
0.5000 mg | INTRAMUSCULAR | Status: DC | PRN
  Administered 2012-11-22: 17:00:00 1 mg via INTRAVENOUS
  Filled 2012-11-22: qty 1

## 2012-11-22 SURGICAL SUPPLY — 59 items
ADH SKN CLS APL DERMABOND .7 (GAUZE/BANDAGES/DRESSINGS) ×1
BAG SPEC THK2 15X12 ZIP CLS (MISCELLANEOUS) ×1
BAG ZIPLOCK 12X15 (MISCELLANEOUS) ×2 IMPLANT
BANDAGE ELASTIC 6 VELCRO ST LF (GAUZE/BANDAGES/DRESSINGS) ×2 IMPLANT
BANDAGE ESMARK 6X9 LF (GAUZE/BANDAGES/DRESSINGS) ×1 IMPLANT
BLADE SAW SGTL 13.0X1.19X90.0M (BLADE) ×2 IMPLANT
BNDG CMPR 9X6 STRL LF SNTH (GAUZE/BANDAGES/DRESSINGS) ×1
BNDG ESMARK 6X9 LF (GAUZE/BANDAGES/DRESSINGS) ×2
BOWL SMART MIX CTS (DISPOSABLE) ×2 IMPLANT
CAPT RP KNEE ×2 IMPLANT
CEMENT HV SMART SET (Cement) ×4 IMPLANT
CLOTH BEACON ORANGE TIMEOUT ST (SAFETY) ×2 IMPLANT
CUFF TOURN SGL QUICK 34 (TOURNIQUET CUFF) ×2
CUFF TRNQT CYL 34X4X40X1 (TOURNIQUET CUFF) ×1 IMPLANT
DECANTER SPIKE VIAL GLASS SM (MISCELLANEOUS) ×2 IMPLANT
DERMABOND ADVANCED (GAUZE/BANDAGES/DRESSINGS) ×1
DERMABOND ADVANCED .7 DNX12 (GAUZE/BANDAGES/DRESSINGS) ×1 IMPLANT
DRAPE EXTREMITY T 121X128X90 (DRAPE) ×2 IMPLANT
DRAPE POUCH INSTRU U-SHP 10X18 (DRAPES) ×2 IMPLANT
DRAPE U-SHAPE 47X51 STRL (DRAPES) ×2 IMPLANT
DRSG AQUACEL AG ADV 3.5X10 (GAUZE/BANDAGES/DRESSINGS) ×2 IMPLANT
DRSG TEGADERM 4X4.75 (GAUZE/BANDAGES/DRESSINGS) ×2 IMPLANT
DURAPREP 26ML APPLICATOR (WOUND CARE) ×2 IMPLANT
ELECT REM PT RETURN 9FT ADLT (ELECTROSURGICAL) ×2
ELECTRODE REM PT RTRN 9FT ADLT (ELECTROSURGICAL) ×1 IMPLANT
EVACUATOR 1/8 PVC DRAIN (DRAIN) ×2 IMPLANT
FACESHIELD LNG OPTICON STERILE (SAFETY) ×10 IMPLANT
GAUZE SPONGE 2X2 8PLY STRL LF (GAUZE/BANDAGES/DRESSINGS) ×1 IMPLANT
GLOVE BIOGEL PI IND STRL 7.5 (GLOVE) ×1 IMPLANT
GLOVE BIOGEL PI IND STRL 8 (GLOVE) ×1 IMPLANT
GLOVE BIOGEL PI INDICATOR 7.5 (GLOVE) ×1
GLOVE BIOGEL PI INDICATOR 8 (GLOVE) ×1
GLOVE ECLIPSE 8.0 STRL XLNG CF (GLOVE) ×2 IMPLANT
GLOVE ORTHO TXT STRL SZ7.5 (GLOVE) ×4 IMPLANT
GOWN BRE IMP PREV XXLGXLNG (GOWN DISPOSABLE) ×2 IMPLANT
GOWN PREVENTION PLUS LG XLONG (DISPOSABLE) ×2 IMPLANT
HANDPIECE INTERPULSE COAX TIP (DISPOSABLE) ×2
KIT BASIN OR (CUSTOM PROCEDURE TRAY) ×2 IMPLANT
MANIFOLD NEPTUNE II (INSTRUMENTS) ×2 IMPLANT
NDL SAFETY ECLIPSE 18X1.5 (NEEDLE) ×1 IMPLANT
NEEDLE HYPO 18GX1.5 SHARP (NEEDLE) ×2
NS IRRIG 1000ML POUR BTL (IV SOLUTION) ×4 IMPLANT
PACK TOTAL JOINT (CUSTOM PROCEDURE TRAY) ×2 IMPLANT
POSITIONER SURGICAL ARM (MISCELLANEOUS) ×2 IMPLANT
SET HNDPC FAN SPRY TIP SCT (DISPOSABLE) ×1 IMPLANT
SET PAD KNEE POSITIONER (MISCELLANEOUS) ×2 IMPLANT
SPONGE GAUZE 2X2 STER 10/PKG (GAUZE/BANDAGES/DRESSINGS) ×1
SPONGE GAUZE 4X4 12PLY (GAUZE/BANDAGES/DRESSINGS) ×2 IMPLANT
SUCTION FRAZIER 12FR DISP (SUCTIONS) ×2 IMPLANT
SUT MNCRL AB 4-0 PS2 18 (SUTURE) ×2 IMPLANT
SUT VIC AB 1 CT1 36 (SUTURE) ×2 IMPLANT
SUT VIC AB 2-0 CT1 27 (SUTURE) ×6
SUT VIC AB 2-0 CT1 TAPERPNT 27 (SUTURE) ×3 IMPLANT
SUT VLOC 180 0 24IN GS25 (SUTURE) ×2 IMPLANT
SYR 50ML LL SCALE MARK (SYRINGE) ×2 IMPLANT
TOWEL OR 17X26 10 PK STRL BLUE (TOWEL DISPOSABLE) ×4 IMPLANT
TRAY FOLEY CATH 14FRSI W/METER (CATHETERS) ×2 IMPLANT
WATER STERILE IRR 1500ML POUR (IV SOLUTION) ×2 IMPLANT
WRAP KNEE MAXI GEL POST OP (GAUZE/BANDAGES/DRESSINGS) ×2 IMPLANT

## 2012-11-22 NOTE — Anesthesia Procedure Notes (Signed)
Spinal  Patient location during procedure: OR Staffing Anesthesiologist: Adlene Adduci Performed by: anesthesiologist  Preanesthetic Checklist Completed: patient identified, site marked, surgical consent, pre-op evaluation, timeout performed, IV checked, risks and benefits discussed and monitors and equipment checked Spinal Block Patient position: sitting Prep: Betadine Patient monitoring: heart rate, continuous pulse ox and blood pressure Approach: right paramedian Location: L3-4 Injection technique: single-shot Needle Needle type: Spinocan  Needle gauge: 22 G Needle length: 9 cm Additional Notes Expiration date of kit checked and confirmed. Patient tolerated procedure well, without complications.     

## 2012-11-22 NOTE — Op Note (Signed)
NAME:  Gabriela Smith                      MEDICAL RECORD NO.:  [id number]                             FACILITY:  [facility]      PHYSICIAN:  Madlyn Frankel. Charlann Boxer, M.D.  DATE OF BIRTH:  23-Jun-1940      DATE OF PROCEDURE:  11/22/2012                                     OPERATIVE REPORT         PREOPERATIVE DIAGNOSIS:  Left knee osteoarthritis.      POSTOPERATIVE DIAGNOSIS:  Left knee osteoarthritis.      FINDINGS:  The patient was noted to have complete loss of cartilage and   bone-on-bone arthritis with associated osteophytes in the medial and patellofemoral compartments of   the knee with a significant synovitis and associated effusion.      PROCEDURE:  Left total knee replacement.      COMPONENTS USED:  DePuy rotating platform posterior stabilized knee   system, a size 3 femur, 3 tibia, 12.5 mm insert, and 38 patellar   button.      SURGEON:  Madlyn Frankel. Charlann Boxer, M.D.      ASSISTANT:  Lanney Gins, PA-C.      ANESTHESIA:  Spinal.      SPECIMENS:  None.      COMPLICATION:  None.      DRAINS:  One Hemovac.  EBL: <150cc      TOURNIQUET TIME:   Total Tourniquet Time Documented: Thigh (Left) - 31 minutes Total: Thigh (Left) - 31 minutes  .      The patient was stable to the recovery room.      INDICATION FOR PROCEDURE:  Gabriela Smith is a 72 y.o. female patient of   mine.  The patient had been seen, evaluated, and treated conservatively in the   office with medication, activity modification, and injections.  The patient had   radiographic changes of bone-on-bone arthritis with endplate sclerosis and osteophytes noted.      The patient failed conservative measures including medication, injections, and activity modification, and at this point was ready for more definitive measures.   Based on the radiographic changes and failed conservative measures, the patient   decided to proceed with total knee replacement.  Risks of infection,   DVT, component failure, need for revision  surgery, postop course, and   expectations were all   discussed and reviewed.  Consent was obtained for benefit of pain   relief.      PROCEDURE IN DETAIL:  The patient was brought to the operative theater.   Once adequate anesthesia, preoperative antibiotics, 2 gm of Ancef administered, the patient was positioned supine with the left thigh tourniquet placed.  The  left lower extremity was prepped and draped in sterile fashion.  A time-   out was performed identifying the patient, planned procedure, and   extremity.      The left lower extremity was placed in the [facility] leg holder.  The leg was   exsanguinated, tourniquet elevated to 250 mmHg.  A midline incision was   made followed by median parapatellar arthrotomy.  Following initial   exposure, attention was first  directed to the patella.  Precut   measurement was noted to be 23 mm.  I resected down to 14 mm and used a   38 patellar button to restore patellar height as well as cover the cut   surface.      The lug holes were drilled and a metal shim was placed to protect the   patella from retractors and saw blades.      At this point, attention was now directed to the femur.  The femoral   canal was opened with a drill, irrigated to try to prevent fat emboli.  An   intramedullary rod was passed at 3 degrees valgus, 10 mm of bone was   resected off the distal femur.  Following this resection, the tibia was   subluxated anteriorly.  Using the extramedullary guide, 10 mm of bone was resected off   the proximal lateral tibia.  We confirmed the gap would be   stable medially and laterally with a 10 mm insert as well as confirmed   the cut was perpendicular in the coronal plane, checking with an alignment rod.      Once this was done, I sized the femur to be a size 3 in the anterior-   posterior dimension, chose a standard component based on medial and   lateral dimension.  The size 3 rotation block was then pinned in   position  anterior referenced using the C-clamp to set rotation.  The   anterior, posterior, and  chamfer cuts were made without difficulty nor   notching making certain that I was along the anterior cortex to help   with flexion gap stability.      The final box cut was made off the lateral aspect of distal femur.      At this point, the tibia was sized to be a size 3, the size 3 tray was   then pinned in position through the medial third of the tubercle,   drilled, and keel punched.  Trial reduction was now carried with a 3 femur,  3 tibia, a 12.5 mm insert, and the 38 patella botton.  The knee was brought to   extension, full extension with good flexion stability with the patella   tracking through the trochlea without application of pressure.  Given   all these findings, the trial components removed.  Final components were   opened and cement was mixed.  The knee was irrigated with normal saline   solution and pulse lavage.  The synovial lining was   then injected with 20cc of Exparel 0.25% Marcaine with epinephrine and 1 cc of Toradol,   total of 61 cc.      The knee was irrigated.  Final implants were then cemented onto clean and   dried cut surfaces of bone with the knee brought to extension with a 12.5 mm trial insert.      Once the cement had fully cured, the excess cement was removed   throughout the knee.  I confirmed I was satisfied with the range of   motion and stability, and the final 12.5 mm PS insert was chosen.  It was   placed into the knee.      The tourniquet had been let down at 31 minutes.  No significant   hemostasis required.  The medium Hemovac drain was placed deep.  The   extensor mechanism was then reapproximated using #1 Vicryl with the knee   in flexion.  The   remaining wound was closed with 2-0 Vicryl and running 4-0 Monocryl.   The knee was cleaned, dried, dressed sterilely using Dermabond and   Aquacel dressing.  Drain site dressed separately.  The patient  was then   brought to recovery room in stable condition, tolerating the procedure   well.   Please note that Physician Assistant, Lanney Gins, was present for the entirety of the case, and was utilized for pre-operative positioning, peri-operative retractor management, general facilitation of the procedure.  He was also utilized for primary wound closure at the end of the case.              Madlyn Frankel Charlann Boxer, M.D.

## 2012-11-22 NOTE — Interval H&P Note (Signed)
History and Physical Interval Note:  11/22/2012 11:42 AM  Gabriela Smith  has presented today for surgery, with the diagnosis of LEFT KNEE OA  The various methods of treatment have been discussed with the patient and family. After consideration of risks, benefits and other options for treatment, the patient has consented to  Procedure(s): LEFT TOTAL KNEE ARTHROPLASTY (Left) as a surgical intervention .  The patient's history has been reviewed, patient examined, no change in status, stable for surgery.  I have reviewed the patient's chart and labs.  Questions were answered to the patient's satisfaction.     Shelda Pal

## 2012-11-22 NOTE — Anesthesia Preprocedure Evaluation (Addendum)
Anesthesia Evaluation  Patient identified by MRN, date of birth, ID band Patient awake    Reviewed: Allergy & Precautions, H&P , NPO status , Patient's Chart, lab work & pertinent test results  Airway Mallampati: II TM Distance: >3 FB Neck ROM: Full    Dental  (+) Caps and Dental Advisory Given   Pulmonary neg pulmonary ROS,  breath sounds clear to auscultation  Pulmonary exam normal       Cardiovascular Exercise Tolerance: Good hypertension, Pt. on home beta blockers - angina+ CAD Rhythm:Regular Rate:Normal  Angioplasty 1993, no stent.   Neuro/Psych negative neurological ROS  negative psych ROS   GI/Hepatic negative GI ROS, Neg liver ROS,   Endo/Other  negative endocrine ROS  Renal/GU negative Renal ROS  negative genitourinary   Musculoskeletal negative musculoskeletal ROS (+)   Abdominal   Peds negative pediatric ROS (+)  Hematology negative hematology ROS (+)   Anesthesia Other Findings   Reproductive/Obstetrics negative OB ROS                           Anesthesia Physical  Anesthesia Plan  ASA: III  Anesthesia Plan: Spinal   Post-op Pain Management:    Induction:   Airway Management Planned: Simple Face Mask  Additional Equipment:   Intra-op Plan:   Post-operative Plan:   Informed Consent: I have reviewed the patients History and Physical, chart, labs and discussed the procedure including the risks, benefits and alternatives for the proposed anesthesia with the patient or authorized representative who has indicated his/her understanding and acceptance.   Dental advisory given  Plan Discussed with: CRNA  Anesthesia Plan Comments:        Anesthesia Quick Evaluation

## 2012-11-22 NOTE — Preoperative (Signed)
Beta Blockers   Reason not to administer Beta Blockers:Not Applicable 

## 2012-11-22 NOTE — Anesthesia Postprocedure Evaluation (Signed)
  Anesthesia Post-op Note  Patient: Gabriela Smith  Procedure(s) Performed: Procedure(s) (LRB): LEFT TOTAL KNEE ARTHROPLASTY (Left)  Patient Location: PACU  Anesthesia Type: Spinal  Level of Consciousness: awake and alert   Airway and Oxygen Therapy: Patient Spontanous Breathing  Post-op Pain: mild  Post-op Assessment: Post-op Vital signs reviewed, Patient's Cardiovascular Status Stable, Respiratory Function Stable, Patent Airway and No signs of Nausea or vomiting  Last Vitals:  Filed Vitals:   11/22/12 1541  BP:   Pulse: 75  Temp:   Resp:     Post-op Vital Signs: stable   Complications: No apparent anesthesia complications

## 2012-11-22 NOTE — Anesthesia Preprocedure Evaluation (Deleted)
Anesthesia Evaluation Anesthesia Physical Anesthesia Plan Anesthesia Quick Evaluation  

## 2012-11-22 NOTE — Plan of Care (Signed)
Problem: Consults Goal: Diagnosis- Total Joint Replacement Primary Total Knee     

## 2012-11-22 NOTE — Transfer of Care (Signed)
Immediate Anesthesia Transfer of Care Note  Patient: Gabriela Smith  Procedure(s) Performed: Procedure(s): LEFT TOTAL KNEE ARTHROPLASTY (Left)  Patient Location: PACU  Anesthesia Type:Spinal  Level of Consciousness: awake, alert , oriented and patient cooperative  Airway & Oxygen Therapy: Patient Spontanous Breathing and Patient connected to face mask oxygen  Post-op Assessment: Report given to PACU RN, Post -op Vital signs reviewed and stable and Patient moving all extremities  Post vital signs: Reviewed and stable  Complications: No apparent anesthesia complications

## 2012-11-23 ENCOUNTER — Encounter (HOSPITAL_COMMUNITY): Payer: Self-pay | Admitting: Orthopedic Surgery

## 2012-11-23 LAB — CBC
HCT: 28.4 % — ABNORMAL LOW (ref 36.0–46.0)
Hemoglobin: 9.5 g/dL — ABNORMAL LOW (ref 12.0–15.0)
MCH: 31.3 pg (ref 26.0–34.0)
MCHC: 33.5 g/dL (ref 30.0–36.0)
MCV: 93.4 fL (ref 78.0–100.0)
Platelets: 209 10*3/uL (ref 150–400)
RBC: 3.04 MIL/uL — ABNORMAL LOW (ref 3.87–5.11)
RDW: 12.5 % (ref 11.5–15.5)
WBC: 7.8 10*3/uL (ref 4.0–10.5)

## 2012-11-23 LAB — BASIC METABOLIC PANEL
BUN: 17 mg/dL (ref 6–23)
CO2: 22 mEq/L (ref 19–32)
Calcium: 8.6 mg/dL (ref 8.4–10.5)
Chloride: 103 mEq/L (ref 96–112)
Creatinine, Ser: 0.73 mg/dL (ref 0.50–1.10)
GFR calc Af Amer: >90 mL/min (ref >90)
GFR calc non Af Amer: 82 mL/min — ABNORMAL LOW (ref >90)
Glucose, Bld: 120 mg/dL — ABNORMAL HIGH (ref 70–99)
Potassium: 4.3 mEq/L (ref 3.5–5.1)
Sodium: 136 mEq/L (ref 135–145)

## 2012-11-23 MED ORDER — TIZANIDINE HCL 4 MG PO TABS: 4.0000 mg | ORAL_TABLET | Freq: Four times a day (QID) | ORAL | Status: DC | PRN

## 2012-11-23 MED ORDER — TRAMADOL HCL 50 MG PO TABS: 50.0000 mg | ORAL_TABLET | Freq: Four times a day (QID) | ORAL | Status: DC | PRN

## 2012-11-23 MED ORDER — POLYETHYLENE GLYCOL 3350 17 G PO PACK: 17.0000 g | PACK | Freq: Two times a day (BID) | ORAL | Status: DC

## 2012-11-23 MED ORDER — FERROUS SULFATE 325 (65 FE) MG PO TABS: 325.0000 mg | ORAL_TABLET | Freq: Three times a day (TID) | ORAL | Status: DC

## 2012-11-23 MED ORDER — DSS 100 MG PO CAPS: 100.0000 mg | ORAL_CAPSULE | Freq: Two times a day (BID) | ORAL | Status: DC

## 2012-11-23 MED ORDER — ACETAMINOPHEN 325 MG PO TABS: 650.0000 mg | ORAL_TABLET | Freq: Four times a day (QID) | ORAL | Status: DC | PRN

## 2012-11-23 MED ORDER — ASPIRIN 325 MG PO TBEC: 325.0000 mg | DELAYED_RELEASE_TABLET | Freq: Two times a day (BID) | ORAL | Status: AC

## 2012-11-23 NOTE — Progress Notes (Signed)
Utilization review completed.  

## 2012-11-23 NOTE — Evaluation (Signed)
Physical Therapy Evaluation Patient Details Name: Gabriela Smith MRN: [id number] DOB: Aug 01, 1940 Today's Date: 11/23/2012 Time: 4098-1191 PT Time Calculation (min): 31 min  PT Assessment / Plan / Recommendation History of Present Illness  72 y.o. female admitted for L TKA  Clinical Impression  *Pt is s/p TKA resulting in the deficits listed below (see PT Problem List).  Pt will benefit from skilled PT to increase their independence and safety with mobility to allow discharge to the venue listed below.     PT Assessment  Patient needs continued PT services    Follow Up Recommendations  Home health PT    Does the patient have the potential to tolerate intense rehabilitation      Barriers to Discharge        Equipment Recommendations  None recommended by PT    Recommendations for Other Services OT consult   Frequency 7X/week    Precautions / Restrictions     Pertinent Vitals/Pain *3/10 L knee when walking Ice applied to L knee premedicated**      Mobility  Bed Mobility Bed Mobility: Supine to Sit Supine to Sit: 5: Supervision Details for Bed Mobility Assistance: VCs for technique, RLE supporting LLE Transfers Transfers: Sit to Stand;Stand to Sit Sit to Stand: 4: Min guard Stand to Sit: 4: Min guard Ambulation/Gait Ambulation/Gait Assistance: 4: Min guard Ambulation Distance (Feet): 44 Feet Assistive device: Rolling walker Ambulation/Gait Assistance Details: VCs for sequencing and for flexed neck Gait Pattern: Step-to pattern;Decreased step length - right;Decreased step length - left;Antalgic Gait velocity: decreased    Exercises Total Joint Exercises Ankle Circles/Pumps: AROM;Both;10 reps;Supine Quad Sets: AROM;Left;5 reps;Supine Heel Slides: AAROM;Left;10 reps;Supine Hip ABduction/ADduction: AAROM;Left;10 reps;Supine Straight Leg Raises: AROM;Left;5 reps;Supine Long Arc Quad: AROM;Left;5 reps;Seated Goniometric ROM: 85* L knee flexion AAROM   PT  Diagnosis: Difficulty walking;Acute pain  PT Problem List: Decreased strength;Decreased range of motion;Decreased activity tolerance;Decreased mobility;Decreased knowledge of use of DME;Pain PT Treatment Interventions: DME instruction;Gait training;Stair training;Functional mobility training;Therapeutic activities;Therapeutic exercise;Patient/family education     PT Goals(Current goals can be found in the care plan section) Acute Rehab PT Goals Patient Stated Goal: play golf, go shopping PT Goal Formulation: With patient/family Time For Goal Achievement: 12/07/12 Potential to Achieve Goals: Good  Visit Information  Last PT Received On: 11/23/12 Assistance Needed: +1 PT/OT Co-Evaluation/Treatment: Yes History of Present Illness: 72 y.o. female admitted for L TKA       Prior Functioning  Home Living Family/patient expects to be discharged to:: Private residence Living Arrangements: Spouse/significant other Available Help at Discharge: Available 24 hours/day Type of Home: House Home Access: Stairs to enter [employer] of Steps: 2 Entrance Stairs-Rails: None Home Layout: One level Home Equipment: Environmental consultant - 2 wheels;Bedside commode;Shower seat - built in;Cane - single point Additional Comments: walk in shower, high commodes Prior Function Level of Independence: Independent Communication Communication: No difficulties    Cognition  Cognition Arousal/Alertness: Awake/alert Behavior During Therapy: WFL for tasks assessed/performed Overall Cognitive Status: Within Functional Limits for tasks assessed    Extremity/Trunk Assessment Upper Extremity Assessment Upper Extremity Assessment: Overall WFL for tasks assessed Lower Extremity Assessment Lower Extremity Assessment: LLE deficits/detail LLE Deficits / Details: knee flexion AAROM 80*, SLR 3/5, ankle WNL, sensation intact to light touch Cervical / Trunk Assessment Cervical / Trunk Assessment: Normal   Balance  Balance Balance Assessed: Yes Static Sitting Balance Static Sitting - Balance Support: No upper extremity supported;Feet supported Static Sitting - Level of Assistance: 7: Independent Static Sitting - Comment/# of  Minutes: 3  End of Session PT - End of Session Equipment Utilized During Treatment: Gait belt Activity Tolerance: Patient tolerated treatment well Patient left: in chair;with call bell/phone within reach;with family/visitor present Nurse Communication: Mobility status  GP     Ralene Bathe Kistler 11/23/2012, 10:47 AM [phone]

## 2012-11-23 NOTE — Progress Notes (Signed)
Patient ID: Gabriela Smith, female   DOB: [birth year], 72 y.o.   MRN: [id number] Subjective: 1 Day Post-Op Procedure(s) (LRB): LEFT TOTAL KNEE ARTHROPLASTY (Left)    Patient reports pain as mild but has not done much activity yet Ate just a little last night Ready for breakfast and therapy  Good attitude  Objective:   VITALS:   Filed Vitals:   11/23/12 0521  BP: 113/73  Pulse: 64  Temp: 97.9 F (36.6 C)  Resp: 16    Neurovascular intact Incision: dressing C/D/I  HV drain removed from knee  LABS  Recent Labs  11/23/12 0438  HGB 9.5*  HCT 28.4*  WBC 7.8  PLT 209     Recent Labs  11/23/12 0438  NA 136  K 4.3  BUN 17  CREATININE 0.73  GLUCOSE 120*    No results found for this basename: LABPT, INR,  in the last 72 hours   Assessment/Plan: 1 Day Post-Op Procedure(s) (LRB): LEFT TOTAL KNEE ARTHROPLASTY (Left)   Advance diet Up with therapy D/C IV fluids Discharge home with home health if stable today after therapy (if not then tomorrow) Aspirin for DVT prophylaxis

## 2012-11-23 NOTE — Evaluation (Signed)
Occupational Therapy Evaluation Patient Details Name: Gabriela Smith MRN: [id number] DOB: 1940/10/14 Today's Date: 11/23/2012 Time: 1002-1029 OT Time Calculation (min): 27 min  OT Assessment / Plan / Recommendation History of present illness 72 y.o. female admitted for L TKA   Clinical Impression   Pt progressing nicely on POD 1. She will benefit from skilled OT services to maximize ADL independence for d/c home with family.    OT Assessment  Patient needs continued OT Services    Follow Up Recommendations  No OT follow up;Supervision/Assistance - 24 hour    Barriers to Discharge      Equipment Recommendations  3 in 1 bedside comode (already in room)    Recommendations for Other Services    Frequency  Min 2X/week    Precautions / Restrictions Precautions Precautions: Knee Restrictions Weight Bearing Restrictions: No Other Position/Activity Restrictions: WBAT   Pertinent Vitals/Pain 3/10 L knee; ice    ADL  Eating/Feeding: Independent Where Assessed - Eating/Feeding: Chair Grooming: Simulated;Wash/dry hands;Set up Where Assessed - Grooming: Supported sitting Upper Body Bathing: Simulated;Chest;Right arm;Left arm;Abdomen;Set up Where Assessed - Upper Body Bathing: Unsupported sitting Lower Body Bathing: Simulated;Minimal assistance Where Assessed - Lower Body Bathing: Supported sit to stand Upper Body Dressing: Simulated;Set up Where Assessed - Upper Body Dressing: Unsupported sitting Lower Body Dressing: Simulated;Minimal assistance Where Assessed - Lower Body Dressing: Supported sit to stand Toilet Transfer: Performed;Min guard Statistician Method: Surveyor, minerals: Materials engineer and Hygiene: Simulated;Min guard Where Assessed - Engineer, mining and Hygiene: Sit to stand from 3-in-1 or toilet Equipment Used: Long-handled shoe horn;Long-handled sponge;Reacher;Rolling walker;Sock aid ADL  Comments: Demonstrated all AE as pt states she will likely purchase AE and use until able to reach to L foot and husband can also use as he has Parkinson's.     OT Diagnosis: Generalized weakness  OT Problem List: Decreased strength;Decreased knowledge of use of DME or AE OT Treatment Interventions: Self-care/ADL training;DME and/or AE instruction;Therapeutic activities;Patient/family education   OT Goals(Current goals can be found in the care plan section) Acute Rehab OT Goals Patient Stated Goal: play golf, go shopping OT Goal Formulation: With patient Time For Goal Achievement: 11/30/12 Potential to Achieve Goals: Good  Visit Information  Last OT Received On: 11/23/12 Assistance Needed: +1 PT/OT Co-Evaluation/Treatment: Yes History of Present Illness: 72 y.o. female admitted for L TKA       Prior Functioning     Home Living Family/patient expects to be discharged to:: Private residence Living Arrangements: Spouse/significant other Available Help at Discharge: Available 24 hours/day Type of Home: House Home Access: Stairs to enter [employer] of Steps: 2 Entrance Stairs-Rails: None Home Layout: One level Home Equipment: Environmental consultant - 2 wheels;Bedside commode;Shower seat - built in;Cane - single point;Adaptive equipment Adaptive Equipment: Long-handled shoe horn;Reacher Additional Comments: walk in shower, high commodes Prior Function Level of Independence: Independent Communication Communication: No difficulties         Vision/Perception     Cognition  Cognition Arousal/Alertness: Awake/alert Behavior During Therapy: WFL for tasks assessed/performed Overall Cognitive Status: Within Functional Limits for tasks assessed    Extremity/Trunk Assessment Upper Extremity Assessment Upper Extremity Assessment: Overall WFL for tasks assessed Lower Extremity Assessment Lower Extremity Assessment: LLE deficits/detail LLE Deficits / Details: knee flexion AAROM  80*, SLR 3/5, ankle WNL, sensation intact to light touch Cervical / Trunk Assessment Cervical / Trunk Assessment: Normal     Mobility Bed Mobility Bed Mobility: Supine to Sit Supine to  Sit: 5: Supervision Details for Bed Mobility Assistance: VCs for technique, RLE supporting LLE Transfers Sit to Stand: 4: Min guard;With upper extremity assist;From bed;From chair/3-in-1 Stand to Sit: 4: Min guard;With upper extremity assist;To chair/3-in-1 Details for Transfer Assistance: min verbal cues for hand placement        Balance Balance Balance Assessed: Yes Static Sitting Balance Static Sitting - Balance Support: No upper extremity supported;Feet supported Static Sitting - Level of Assistance: 7: Independent Static Sitting - Comment/# of Minutes: 3 Dynamic Standing Balance Dynamic Standing - Level of Assistance: 4: Min assist (min guard assist)   End of Session OT - End of Session Equipment Utilized During Treatment: Gait belt Activity Tolerance: Patient tolerated treatment well Patient left: in chair;with call bell/phone within reach;with family/visitor present  GO     [name] [phone] 11/23/2012, 11:34 AM

## 2012-11-23 NOTE — Progress Notes (Signed)
Physical Therapy Treatment Patient Details Name: Gabriela Smith MRN: [id number] DOB: 05/28/1940 Today's Date: 11/23/2012 Time: 4098-1191 PT Time Calculation (min): 12 min  PT Assessment / Plan / Recommendation  History of Present Illness 72 y.o. female admitted for L TKA   PT Comments   *Assisted pt walk to bathroom then to bed. Pt fatigued and declined exercises, stated she's been doing ankle pumps and quad sets independently. Ice applied to L knee. **  Follow Up Recommendations  Home health PT     Does the patient have the potential to tolerate intense rehabilitation     Barriers to Discharge        Equipment Recommendations  None recommended by PT    Recommendations for Other Services OT consult  Frequency 7X/week   Progress towards PT Goals Progress towards PT goals: Progressing toward goals  Plan Current plan remains appropriate    Precautions / Restrictions Precautions Precautions: Knee Restrictions Weight Bearing Restrictions: No Other Position/Activity Restrictions: WBAT   Pertinent Vitals/Pain **5/10 L knee RN notified Ice applied*    Mobility  Bed Mobility Bed Mobility: Sit to Supine Supine to Sit: 5: Supervision Sit to Supine: 5: Supervision Details for Bed Mobility Assistance: VCs for technique, RLE supporting LLE Transfers Transfers: Sit to Stand;Stand to Sit Sit to Stand: With upper extremity assist;From chair/3-in-1;5: Supervision Stand to Sit: With upper extremity assist;5: Supervision;To bed;To chair/3-in-1 Details for Transfer Assistance: min verbal cues for hand placement Ambulation/Gait Ambulation/Gait Assistance: 5: Supervision Ambulation Distance (Feet): 22 Feet Assistive device: Rolling walker Ambulation/Gait Assistance Details: VCs for sequencing Gait Pattern: Step-to pattern;Decreased step length - right;Decreased step length - left;Antalgic Gait velocity: decreased        PT Diagnosis: Difficulty walking;Acute pain  PT Problem  List: Decreased strength;Decreased range of motion;Decreased activity tolerance;Decreased mobility;Decreased knowledge of use of DME;Pain PT Treatment Interventions: DME instruction;Gait training;Stair training;Functional mobility training;Therapeutic activities;Therapeutic exercise;Patient/family education   PT Goals (current goals can now be found in the care plan section) Acute Rehab PT Goals Patient Stated Goal: play golf, go shopping PT Goal Formulation: With patient/family Time For Goal Achievement: 12/07/12 Potential to Achieve Goals: Good  Visit Information  Last PT Received On: 11/23/12 Assistance Needed: +1 PT/OT Co-Evaluation/Treatment: Yes History of Present Illness: 72 y.o. female admitted for L TKA    Subjective Data  Patient Stated Goal: play golf, go shopping   Cognition  Cognition Arousal/Alertness: Awake/alert Behavior During Therapy: WFL for tasks assessed/performed Overall Cognitive Status: Within Functional Limits for tasks assessed        End of Session PT - End of Session Equipment Utilized During Treatment: Gait belt Activity Tolerance: Patient tolerated treatment well Patient left: with call bell/phone within reach;with family/visitor present;in bed Nurse Communication: Mobility status   GP     Ralene Bathe Kistler 11/23/2012, 12:45 PM [phone]

## 2012-11-23 NOTE — Progress Notes (Signed)
[facility]  [facility] is providing the following services: RW and Commode  If patient discharges after hours, please call [phone].   Renard Hamper 11/23/2012, 8:28 AM

## 2012-11-24 DIAGNOSIS — E663 Overweight: Secondary | ICD-10-CM

## 2012-11-24 DIAGNOSIS — D500 Iron deficiency anemia secondary to blood loss (chronic): Secondary | ICD-10-CM | POA: Diagnosis not present

## 2012-11-24 LAB — BASIC METABOLIC PANEL
BUN: 14 mg/dL (ref 6–23)
CO2: 24 mEq/L (ref 19–32)
Calcium: 8.7 mg/dL (ref 8.4–10.5)
Chloride: 103 mEq/L (ref 96–112)
Creatinine, Ser: 0.65 mg/dL (ref 0.50–1.10)
GFR calc Af Amer: >90 mL/min (ref >90)
GFR calc non Af Amer: 85 mL/min — ABNORMAL LOW (ref >90)
Glucose, Bld: 127 mg/dL — ABNORMAL HIGH (ref 70–99)
Potassium: 3.6 mEq/L (ref 3.5–5.1)
Sodium: 136 mEq/L (ref 135–145)

## 2012-11-24 LAB — CBC
HCT: 26.6 % — ABNORMAL LOW (ref 36.0–46.0)
Hemoglobin: 8.8 g/dL — ABNORMAL LOW (ref 12.0–15.0)
MCH: 30.9 pg (ref 26.0–34.0)
MCHC: 33.1 g/dL (ref 30.0–36.0)
MCV: 93.3 fL (ref 78.0–100.0)
Platelets: 187 10*3/uL (ref 150–400)
RBC: 2.85 MIL/uL — ABNORMAL LOW (ref 3.87–5.11)
RDW: 12.8 % (ref 11.5–15.5)
WBC: 8.5 10*3/uL (ref 4.0–10.5)

## 2012-11-24 NOTE — Progress Notes (Signed)
   Subjective: 2 Days Post-Op Procedure(s) (LRB): LEFT TOTAL KNEE ARTHROPLASTY (Left)   Patient reports pain as mild, pain well controlled. No events throughout the night. Ready to be discharged home.  Objective:   VITALS:   Filed Vitals:   11/24/12 0619  BP: 164/72  Pulse: 64  Temp: 98.1 F (36.7 C)  Resp: 16    Neurovascular intact Dorsiflexion/Plantar flexion intact Incision: dressing C/D/I No cellulitis present Compartment soft  LABS  Recent Labs  11/23/12 0438 11/24/12 0419  HGB 9.5* 8.8*  HCT 28.4* 26.6*  WBC 7.8 8.5  PLT 209 187     Recent Labs  11/23/12 0438 11/24/12 0419  NA 136 136  K 4.3 3.6  BUN 17 14  CREATININE 0.73 0.65  GLUCOSE 120* 127*     Assessment/Plan: 2 Days Post-Op Procedure(s) (LRB): LEFT TOTAL KNEE ARTHROPLASTY (Left) Up with therapy Discharge home with home health Follow up in 2 weeks at [facility]. Follow up with OLIN,Danial Hlavac D in 2 weeks.  Contact information:  [facility] [address] [place] [address] [phone]    Expected ABLA  Treated with iron and will observe  Overweight (BMI 25-29.9) Estimated body mass index is 29.68 kg/(m^2) as calculated from the following:   Height as of this encounter: 5\' 4"  (1.626 m).   Weight as of this encounter: 78.472 kg (173 lb). Patient also counseled that weight may inhibit the healing process Patient counseled that losing weight will help with future health issues        Anastasio Auerbach. Lorriann Hansmann   PAC  11/24/2012, 7:30 AM

## 2012-11-24 NOTE — Care Management Note (Signed)
    Page 1 of 2   11/24/2012     3:25:37 PM   CARE MANAGEMENT NOTE 11/24/2012  Patient:  Gabriela Smith, Gabriela Smith   Account Number:  [id number]  Date Initiated:  11/24/2012  Documentation initiated by:  Colleen Can  Subjective/Objective Assessment:   DX LEFT KNEE OSTEOARTHRITIS; TOTAL KNEE REPLACEMNT     Action/Plan:   CM spoke with patient and spouse. Plans are for patient to return to home in [facility]) where spouse will be caregiver.   Anticipated DC Date:  11/24/2012   Anticipated DC Plan:  HOME W HOME HEALTH SERVICES      DC Planning Services  CM consult      PAC Choice  DURABLE MEDICAL EQUIPMENT  HOME HEALTH   Choice offered to / List presented to:  C-1 Patient   DME arranged  3-N-1  Levan Hurst      DME agency  [facility]     HH arranged  HH-2 PT      [facility] agency  [facility]   Status of service:  Completed, signed off Medicare Important Message given?  NA - LOS <3 / Initial given by admissions (If response is "NO", the following Medicare IM given date fields will be blank) Date Medicare IM given:   Date Additional Medicare IM given:    Discharge Disposition:  HOME W HOME HEALTH SERVICES  Per UR Regulation:    If discussed at Long Length of Stay Meetings, dates discussed:    Comments:  11/24/2012 Colleen Can BSN RN CCM [phone] [facility] will provide Hpt services with start date of tomorrow 11/25/2012.

## 2012-11-24 NOTE — Progress Notes (Signed)
Physical Therapy Treatment Patient Details Name: Gabriela Smith MRN: [id number] DOB: 03/07/1941 Today's Date: 11/24/2012 Time: 2536-6440 PT Time Calculation (min): 25 min  PT Assessment / Plan / Recommendation  History of Present Illness 72 y.o. female admitted for L TKA   PT Comments   *Stair training completed with pt and her husband, pt ambulated 41' with RW, performed TKA exercises with assist. Pt fatigued today 2* being up at 2am with IV coming out, also Hgb is 8.8. Ready to DC home from PT standpoint.**  Follow Up Recommendations  Home health PT     Does the patient have the potential to tolerate intense rehabilitation     Barriers to Discharge        Equipment Recommendations  None recommended by PT    Recommendations for Other Services OT consult  Frequency 7X/week   Progress towards PT Goals Progress towards PT goals: Progressing toward goals  Plan Current plan remains appropriate    Precautions / Restrictions Precautions Precautions: Knee Restrictions Weight Bearing Restrictions: No Other Position/Activity Restrictions: WBAT   Pertinent Vitals/Pain *L knee 5/10  Premedicated  Ice applied**    Mobility  Bed Mobility Bed Mobility: Not assessed Transfers Sit to Stand: With upper extremity assist;From chair/3-in-1;5: Supervision;With armrests Stand to Sit: With upper extremity assist;5: Supervision;To chair/3-in-1;With armrests Details for Transfer Assistance: min verbal cues for hand placement Ambulation/Gait Ambulation/Gait Assistance: 5: Supervision Ambulation Distance (Feet): 80 Feet Assistive device: Rolling walker Gait Pattern: Step-to pattern;Decreased step length - right;Decreased step length - left;Antalgic Gait velocity: decreased General Gait Details: VCs for heel strike LLE and to flex L knee with swing phase Stairs: Yes Stairs Assistance: 4: Min guard (husband stabilized RW, VCs sequencing) Stair Management Technique: No rails;Step to  pattern;Backwards;With walker Number of Stairs: 3    Exercises Total Joint Exercises Ankle Circles/Pumps: AROM;Both;10 reps;Supine Quad Sets: AROM;Left;5 reps;Supine Towel Squeeze: AROM;Both;5 reps;Supine Heel Slides: AAROM;Left;10 reps;Supine Hip ABduction/ADduction: AAROM;Left;10 reps;Supine Straight Leg Raises: Left;5 reps;Supine;AAROM Goniometric ROM: 70* L knee flexion AAROM   PT Diagnosis:    PT Problem List:   PT Treatment Interventions:     PT Goals (current goals can now be found in the care plan section) Acute Rehab PT Goals Patient Stated Goal: play golf, go shopping PT Goal Formulation: With patient/family Time For Goal Achievement: 12/07/12 Potential to Achieve Goals: Good  Visit Information  Last PT Received On: 11/24/12 Assistance Needed: +1 History of Present Illness: 72 y.o. female admitted for L TKA    Subjective Data  Patient Stated Goal: play golf, go shopping   Cognition  Cognition Arousal/Alertness: Awake/alert Behavior During Therapy: WFL for tasks assessed/performed Overall Cognitive Status: Within Functional Limits for tasks assessed    Balance     End of Session PT - End of Session Equipment Utilized During Treatment: Gait belt Activity Tolerance: Patient tolerated treatment well;Patient limited by fatigue (pt slept poorly 2* IV coming out at 2am so is tired) Patient left: with call bell/phone within reach;with family/visitor present;in chair Nurse Communication: Mobility status   GP     Ralene Bathe Kistler 11/24/2012, 9:59 AM [phone]

## 2012-11-29 ENCOUNTER — Telehealth: Payer: Self-pay | Admitting: Family Medicine

## 2012-11-29 NOTE — Telephone Encounter (Signed)
MOORE TO ADDRESS

## 2012-11-29 NOTE — Telephone Encounter (Signed)
Diflucan 150 #2 tablets take one today and repeat in one week

## 2012-11-30 NOTE — Telephone Encounter (Signed)
med called to pharm and pt aware.

## 2012-12-06 NOTE — Discharge Summary (Signed)
Physician Discharge Summary  Patient ID: Gabriela Smith MRN: [id number] DOB/AGE: [birth year] 74 y.o.  Admit date: 11/22/2012 Discharge date: 11/24/2012   Procedures:  Procedure(s) (LRB): LEFT TOTAL KNEE ARTHROPLASTY (Left)  Attending Physician:  Dr. Durene Romans   Admission Diagnoses:   Left knee OA / pain.  Discharge Diagnoses:  Principal Problem:   S/P left TKA Active Problems:   Expected blood loss anemia   Overweight (BMI 25.0-29.9)  Past Medical History  Diagnosis Date  . Hypertension 1993  . Hyperlipidemia 1993  . CAD (coronary artery disease) CARDIOLOGIST- [facility]--  VISIT 01-02-10 IN EPIC    1993-- PTCA. Pt describes a near total blockage of apparently the LAD and a 65% stenosis elsewhere.  . Frequency of urination   . Nocturia   . History of bladder cancer followed by dr Vernie Ammons  . Diverticulosis   . Osteopenia   . Shortness of breath     ONLY WITH EXERTION  . Arthritis     OA lt knee- cortizone inj. q 4 months AND OA ALSO IN BACK    HPI: Gabriela Smith, 72 y.o. female, has a history of pain and functional disability in the left knee due to arthritis and has failed non-surgical conservative treatments for greater than 12 weeks to include NSAID's and/or analgesics, corticosteriod injections and activity modification. Onset of symptoms was gradual, starting 1+ years ago with rapidlly worsening course since that time. The patient noted no past surgery on the left knee(s). Patient currently rates pain in the left knee(s) at 10 out of 10 with activity. Patient has worsening of pain with activity and weight bearing, pain that interferes with activities of daily living, pain with passive range of motion, crepitus and joint swelling. Patient has evidence of periarticular osteophytes and joint space narrowing by imaging studies. There is no active infection. Risks, benefits and expectations were discussed with the patient. Patient understand the risks, benefits and  expectations and wishes to proceed with surgery.  PCP: Rudi Heap, MD   Discharged Condition: good  Hospital Course:  Patient underwent the above stated procedure on 11/22/2012. Patient tolerated the procedure well and brought to the recovery room in good condition and subsequently to the floor.  POD #1 BP: 113/73 ; Pulse: 64 ; Temp: 97.9 F (36.6 C) ; Resp: 16  Pt's foley was removed, as well as the hemovac drain removed. IV was changed to a saline lock. Patient reports pain as mild but has not done much activity yet.  Ate just a little last night. Ready for breakfast and therapy. Neurovascular intact, dorsiflexion/plantar flexion intact, incision: dressing C/D/I, no cellulitis present and compartment soft.   LABS  Basename    HGB  9.5  HCT  28.4   POD #2  BP: 164/72 ; Pulse: 64 ; Temp: 98.1 F (36.7 C) ; Resp: 16 Patient reports pain as mild, pain well controlled. No events throughout the night. Ready to be discharged home. Neurovascular intact, dorsiflexion/plantar flexion intact, incision: dressing C/D/I, no cellulitis present and compartment soft.   LABS  Basename    HGB  8.8  HCT  26.6    Discharge Exam: General appearance: alert, cooperative and no distress Extremities: Homans sign is negative, no sign of DVT, no edema, redness or tenderness in the calves or thighs and no ulcers, gangrene or trophic changes  Disposition:    Home or Self Care with follow up in 2 weeks   Follow-up Information   Follow up with [facility]  D, MD. Schedule an appointment as soon as possible for a visit in 2 weeks.   Specialty:  Orthopedic Surgery   Contact information:   [address] [place] [address] [phone]       Discharge Orders   Future Appointments Provider Department Dept Phone   02/08/2013 8:30 AM Ernestina Penna, MD [facility] [phone]   Future Orders Complete By Expires   Call MD / Call 911  As directed     Comments:     If you experience chest pain or shortness of breath, CALL 911 and be transported to the hospital emergency room.  If you develope a fever above 101 F, pus (white drainage) or increased drainage or redness at the wound, or calf pain, call your surgeon's office.   Change dressing  As directed    Comments:     Maintain surgical dressing for 10-14 days, then change the dressing daily with sterile 4 x 4 inch gauze dressing and tape. Keep the area dry and clean.   Constipation Prevention  As directed    Comments:     Drink plenty of fluids.  Prune juice may be helpful.  You may use a stool softener, such as Colace (over the counter) 100 mg twice a day.  Use MiraLax (over the counter) for constipation as needed.   Diet - low sodium heart healthy  As directed    Discharge instructions  As directed    Comments:     Maintain surgical dressing for 10-14 days, then replace with gauze and tape. Keep the area dry and clean until follow up. Follow up in 2 weeks at [facility] Call with any questions or concerns.   Driving restrictions  As directed    Comments:     No driving for 4 weeks   Increase activity slowly as tolerated  As directed    TED hose  As directed    Comments:     Use stockings (TED hose) for 2 weeks on both leg(s).  You may remove them at night for sleeping.   Weight bearing as tolerated  As directed         Medication List         acetaminophen 325 MG tablet  Commonly known as:  TYLENOL  Take 2 tablets (650 mg total) by mouth every 6 (six) hours as needed.     aspirin 325 MG EC tablet  Take 1 tablet (325 mg total) by mouth 2 (two) times daily.     cholecalciferol 1000 UNITS tablet  Commonly known as:  VITAMIN D  Take 1,000 Units by mouth daily.     co-enzyme Q-10 50 MG capsule  Take 50 mg by mouth daily.     DSS 100 MG Caps  Take 100 mg by mouth 2 (two) times daily.     ferrous sulfate 325 (65 FE) MG tablet  Take 1 tablet (325 mg total) by  mouth 3 (three) times daily after meals.     Flax Seed Oil 1000 MG Caps  Take 1 capsule by mouth daily.     glucosamine-chondroitin 500-400 MG tablet  Take 1 tablet by mouth daily.     hydrochlorothiazide 12.5 MG tablet  Commonly known as:  HYDRODIURIL  Take 12.5 mg by mouth daily.     metoprolol tartrate 25 MG tablet  Commonly known as:  LOPRESSOR  Take 25 mg by mouth 2 (two) times daily.     polyethylene  glycol packet  Commonly known as:  MIRALAX / GLYCOLAX  Take 17 g by mouth 2 (two) times daily.     tiZANidine 4 MG tablet  Commonly known as:  ZANAFLEX  Take 1 tablet (4 mg total) by mouth every 6 (six) hours as needed.     traMADol 50 MG tablet  Commonly known as:  ULTRAM  Take 1-2 tablets (50-100 mg total) by mouth every 6 (six) hours as needed for pain.         Signed: Anastasio Auerbach. [place]   PAC  12/06/2012, 9:53 AM

## 2012-12-13 ENCOUNTER — Ambulatory Visit: Payer: Medicare Other | Attending: Orthopedic Surgery | Admitting: Physical Therapy

## 2012-12-13 DIAGNOSIS — M25569 Pain in unspecified knee: Secondary | ICD-10-CM | POA: Insufficient documentation

## 2012-12-13 DIAGNOSIS — IMO0001 Reserved for inherently not codable concepts without codable children: Secondary | ICD-10-CM | POA: Insufficient documentation

## 2012-12-13 DIAGNOSIS — M25669 Stiffness of unspecified knee, not elsewhere classified: Secondary | ICD-10-CM | POA: Insufficient documentation

## 2012-12-13 DIAGNOSIS — R5381 Other malaise: Secondary | ICD-10-CM | POA: Insufficient documentation

## 2012-12-13 DIAGNOSIS — Z96659 Presence of unspecified artificial knee joint: Secondary | ICD-10-CM | POA: Insufficient documentation

## 2012-12-15 ENCOUNTER — Ambulatory Visit: Payer: Medicare Other | Admitting: Physical Therapy

## 2012-12-17 ENCOUNTER — Ambulatory Visit: Payer: Medicare Other

## 2012-12-20 ENCOUNTER — Ambulatory Visit: Payer: Medicare Other

## 2012-12-21 ENCOUNTER — Encounter (INDEPENDENT_AMBULATORY_CARE_PROVIDER_SITE_OTHER): Payer: Self-pay

## 2012-12-21 ENCOUNTER — Ambulatory Visit (INDEPENDENT_AMBULATORY_CARE_PROVIDER_SITE_OTHER): Payer: Medicare Other | Admitting: *Deleted

## 2012-12-21 DIAGNOSIS — Z23 Encounter for immunization: Secondary | ICD-10-CM

## 2012-12-22 ENCOUNTER — Ambulatory Visit: Payer: Medicare Other

## 2012-12-24 ENCOUNTER — Ambulatory Visit: Payer: Medicare Other

## 2012-12-27 ENCOUNTER — Ambulatory Visit: Payer: Medicare Other | Admitting: Physical Therapy

## 2012-12-27 ENCOUNTER — Other Ambulatory Visit: Payer: Self-pay | Admitting: Family Medicine

## 2012-12-29 ENCOUNTER — Ambulatory Visit: Payer: Medicare Other

## 2012-12-31 ENCOUNTER — Ambulatory Visit: Payer: Medicare Other | Admitting: *Deleted

## 2013-01-03 ENCOUNTER — Ambulatory Visit: Payer: Medicare Other

## 2013-01-05 ENCOUNTER — Ambulatory Visit: Payer: Medicare Other | Admitting: Physical Therapy

## 2013-01-06 ENCOUNTER — Other Ambulatory Visit: Payer: Self-pay

## 2013-01-06 DIAGNOSIS — Z1231 Encounter for screening mammogram for malignant neoplasm of breast: Secondary | ICD-10-CM

## 2013-01-07 ENCOUNTER — Ambulatory Visit: Payer: Medicare Other | Admitting: Physical Therapy

## 2013-01-10 ENCOUNTER — Ambulatory Visit: Payer: Medicare Other | Attending: Orthopedic Surgery

## 2013-01-10 DIAGNOSIS — R5381 Other malaise: Secondary | ICD-10-CM | POA: Insufficient documentation

## 2013-01-10 DIAGNOSIS — M25669 Stiffness of unspecified knee, not elsewhere classified: Secondary | ICD-10-CM | POA: Insufficient documentation

## 2013-01-10 DIAGNOSIS — Z96659 Presence of unspecified artificial knee joint: Secondary | ICD-10-CM | POA: Insufficient documentation

## 2013-01-10 DIAGNOSIS — IMO0001 Reserved for inherently not codable concepts without codable children: Secondary | ICD-10-CM | POA: Insufficient documentation

## 2013-01-10 DIAGNOSIS — M25569 Pain in unspecified knee: Secondary | ICD-10-CM | POA: Insufficient documentation

## 2013-01-12 ENCOUNTER — Ambulatory Visit: Payer: Medicare Other

## 2013-01-14 ENCOUNTER — Ambulatory Visit: Payer: Medicare Other | Admitting: Physical Therapy

## 2013-01-17 ENCOUNTER — Ambulatory Visit: Payer: Medicare Other | Admitting: Physical Therapy

## 2013-01-19 ENCOUNTER — Ambulatory Visit: Payer: Medicare Other | Admitting: Physical Therapy

## 2013-01-20 ENCOUNTER — Ambulatory Visit: Payer: Medicare Other | Admitting: Physical Therapy

## 2013-01-24 ENCOUNTER — Ambulatory Visit: Payer: Medicare Other | Admitting: Physical Therapy

## 2013-01-26 ENCOUNTER — Ambulatory Visit: Payer: Medicare Other | Admitting: Physical Therapy

## 2013-01-28 ENCOUNTER — Ambulatory Visit: Payer: Medicare Other | Admitting: Physical Therapy

## 2013-01-31 ENCOUNTER — Ambulatory Visit: Payer: Medicare Other | Admitting: *Deleted

## 2013-02-02 ENCOUNTER — Ambulatory Visit: Payer: Medicare Other | Admitting: Physical Therapy

## 2013-02-07 ENCOUNTER — Ambulatory Visit
Admission: RE | Admit: 2013-02-07 | Discharge: 2013-02-07 | Disposition: A | Discharge status: Home / Self Care | Payer: Medicare Other | Source: Ambulatory Visit

## 2013-02-07 DIAGNOSIS — Z1231 Encounter for screening mammogram for malignant neoplasm of breast: Secondary | ICD-10-CM

## 2013-02-08 ENCOUNTER — Ambulatory Visit (INDEPENDENT_AMBULATORY_CARE_PROVIDER_SITE_OTHER): Payer: Medicare Other | Admitting: Family Medicine

## 2013-02-08 ENCOUNTER — Encounter: Payer: Self-pay | Admitting: Family Medicine

## 2013-02-08 VITALS — BP 144/84 | HR 73 | Temp 97.1°F | Ht 64.0 in | Wt 168.0 lb

## 2013-02-08 DIAGNOSIS — I10 Essential (primary) hypertension: Secondary | ICD-10-CM

## 2013-02-08 DIAGNOSIS — M5136 Other intervertebral disc degeneration, lumbar region: Secondary | ICD-10-CM

## 2013-02-08 DIAGNOSIS — Z8551 Personal history of malignant neoplasm of bladder: Secondary | ICD-10-CM

## 2013-02-08 DIAGNOSIS — E785 Hyperlipidemia, unspecified: Secondary | ICD-10-CM

## 2013-02-08 DIAGNOSIS — M5137 Other intervertebral disc degeneration, lumbosacral region: Secondary | ICD-10-CM

## 2013-02-08 DIAGNOSIS — E559 Vitamin D deficiency, unspecified: Secondary | ICD-10-CM

## 2013-02-08 DIAGNOSIS — Z780 Asymptomatic menopausal state: Secondary | ICD-10-CM

## 2013-02-08 DIAGNOSIS — M51369 Other intervertebral disc degeneration, lumbar region without mention of lumbar back pain or lower extremity pain: Secondary | ICD-10-CM

## 2013-02-08 HISTORY — DX: Vitamin D deficiency, unspecified: E55.9

## 2013-02-08 HISTORY — DX: Other intervertebral disc degeneration, lumbar region without mention of lumbar back pain or lower extremity pain: M51.369

## 2013-02-08 NOTE — Patient Instructions (Addendum)
Continue current medications. Continue good therapeutic lifestyle changes which include good diet and exercise. Fall precautions discussed with patient. If you are over 74 years old - you may need Prevnar 13 or the adult Pneumonia vaccine.(WAIT UNTIL 11-08-13) Try  Aleve one twice daily after breakfast and supper, at least until your visit with the orthopedist, monitor blood pressure while taking this and discontinue if it bothered her stomach or raises her blood pressure. You will need to remember to get a Prevnar shot late next summer Discussion her back pain and hip pain and the bunion on her left foot with the orthopedist You'll be scheduled for a DEXA scan in this office

## 2013-02-08 NOTE — Progress Notes (Signed)
Subjective:    Patient ID: Gabriela Smith, female    DOB: [birth year], 72 y.o.   MRN: [id number]  HPI Pt here for follow up and management of chronic medical problems. Patient is doing well she is status post left knee replacement about 2-1/2 months ago. She is having some problems with her low back and both hips. She has x-rays that were done here in this practice in August that showed degenerative changes especially at L4 and 5. She will see Dr. Charlann Boxer on December 11. We will ask him to look at her L. spine films and see if she thinks any injections by Dr. Ethelene Hal might be helpful.     Patient Active Problem List   Diagnosis Date Noted  . Expected blood loss anemia 11/24/2012  . Overweight (BMI 25.0-29.9) 11/24/2012  . S/P left TKA 11/22/2012  . History of bladder cancer 11/04/2012  . Osteoarthritis of left knee 11/04/2012  . Hyperlipidemia 11/28/2008  . Hypertension 11/28/2008  . CORONARY ATHEROSCLEROSIS NATIVE CORONARY ARTERY 11/28/2008   Outpatient Encounter Prescriptions as of 02/08/2013  Medication Sig  . co-enzyme Q-10 50 MG capsule Take 50 mg by mouth daily.  . Flaxseed, Linseed, (FLAX SEED OIL) 1000 MG CAPS Take 1 capsule by mouth daily.   . hydrochlorothiazide (MICROZIDE) 12.5 MG capsule TAKE (1) CAPSULE DAILY  . metoprolol tartrate (LOPRESSOR) 25 MG tablet Take 25 mg by mouth 2 (two) times daily.  . [DISCONTINUED] acetaminophen (TYLENOL) 325 MG tablet Take 2 tablets (650 mg total) by mouth every 6 (six) hours as needed.  . [DISCONTINUED] cholecalciferol (VITAMIN D) 1000 UNITS tablet Take 1,000 Units by mouth daily.  . [DISCONTINUED] docusate sodium 100 MG CAPS Take 100 mg by mouth 2 (two) times daily.  . [DISCONTINUED] ferrous sulfate 325 (65 FE) MG tablet Take 1 tablet (325 mg total) by mouth 3 (three) times daily after meals.  . [DISCONTINUED] glucosamine-chondroitin 500-400 MG tablet Take 1 tablet by mouth daily.  . [DISCONTINUED] hydrochlorothiazide (HYDRODIURIL) 12.5 MG  tablet Take 12.5 mg by mouth daily.  . [DISCONTINUED] metoprolol tartrate (LOPRESSOR) 25 MG tablet Take 25 mg by mouth 2 (two) times daily.  . [DISCONTINUED] polyethylene glycol (MIRALAX / GLYCOLAX) packet Take 17 g by mouth 2 (two) times daily.  . [DISCONTINUED] tiZANidine (ZANAFLEX) 4 MG tablet Take 1 tablet (4 mg total) by mouth every 6 (six) hours as needed.  . [DISCONTINUED] traMADol (ULTRAM) 50 MG tablet Take 1-2 tablets (50-100 mg total) by mouth every 6 (six) hours as needed for pain.    Review of Systems  Constitutional: Negative.   HENT: Negative.   Eyes: Negative.   Respiratory: Negative.   Cardiovascular: Negative.   Gastrointestinal: Negative.   Endocrine: Negative.   Genitourinary: Negative.   Musculoskeletal:       She has low back pain radiating to both hips  Neurological: Negative.   Hematological: Negative.   Psychiatric/Behavioral: Negative.        Objective:   Physical Exam  Nursing note and vitals reviewed. Constitutional: She is oriented to person, place, and time. She appears well-developed and well-nourished. No distress.  Pleasant  HENT:  Head: Normocephalic and atraumatic.  Right Ear: External ear normal.  Left Ear: External ear normal.  Nose: Nose normal.  Mouth/Throat: Oropharynx is clear and moist.  Eyes: Conjunctivae and EOM are normal. Pupils are equal, round, and reactive to light. Right eye exhibits no discharge. Left eye exhibits no discharge. No scleral icterus.  Neck: Normal range of motion.  Neck supple. No JVD present. No thyromegaly present.  No carotid bruit  Cardiovascular: Normal rate, regular rhythm, normal heart sounds and intact distal pulses.  Exam reveals no gallop and no friction rub.   No murmur heard. At 72 per minute  Pulmonary/Chest: Effort normal and breath sounds normal. No respiratory distress. She has no wheezes. She has no rales.  Abdominal: Soft. Bowel sounds are normal. She exhibits no mass. There is no tenderness.  There is no rebound and no guarding.  Musculoskeletal: Normal range of motion. She exhibits no edema and no tenderness.  Lymphadenopathy:    She has no cervical adenopathy.  Neurological: She is alert and oriented to person, place, and time. She has normal reflexes. No cranial nerve deficit.  Skin: Skin is warm and dry. No rash noted.  Psychiatric: She has a normal mood and affect. Her behavior is normal. Judgment and thought content normal.   BP 144/84  Pulse 73  Temp(Src) 97.1 F (36.2 C) (Oral)  Ht 5\' 4"  (1.626 m)  Wt 168 lb (76.204 kg)  BMI 28.82 kg/m2        Assessment & Plan:  1. Hyperlipidemia - NMR, lipoprofile - CBC With differential/Platelet  2. Hypertension - BMP8+EGFR - Hepatic function panel - CBC With differential/Platelet  3. Postmenopausal - DG Bone Density; Future - CBC With differential/Platelet  4. Vitamin D deficiency - Vit D  25 hydroxy (rtn osteoporosis monitoring) - DG Bone Density; Future - CBC With differential/Platelet  5. Degenerative disc disease, lumbar   6. History of bladder cancer -Followup with Dr. Vernie Ammons as plan  Meds ordered this encounter  Medications  . metoprolol tartrate (LOPRESSOR) 25 MG tablet    Sig: Take 25 mg by mouth 2 (two) times daily.     Patient Instructions  Continue current medications. Continue good therapeutic lifestyle changes which include good diet and exercise. Fall precautions discussed with patient. If you are over 93 years old - you may need Prevnar 13 or the adult Pneumonia vaccine.(WAIT UNTIL 11-08-13) Try  Aleve one twice daily after breakfast and supper, at least until your visit with the orthopedist, monitor blood pressure while taking this and discontinue if it bothered her stomach or raises her blood pressure. You will need to remember to get a Prevnar shot late next summer Discussion her back pain and hip pain and the bunion on her left foot with the orthopedist You'll be scheduled for a  DEXA scan in this office   Nyra Capes MD

## 2013-02-10 LAB — BMP8+EGFR
BUN/Creatinine Ratio: 21 (ref 11–26)
BUN: 15 mg/dL (ref 8–27)
CO2: 23 mmol/L (ref 18–29)
Calcium: 9.4 mg/dL (ref 8.6–10.2)
Chloride: 98 mmol/L (ref 97–108)
Creatinine, Ser: 0.72 mg/dL (ref 0.57–1.00)
GFR calc Af Amer: 95 mL/min/{1.73_m2} (ref >59)
GFR calc non Af Amer: 83 mL/min/{1.73_m2} (ref >59)
Glucose: 90 mg/dL (ref 65–99)
Potassium: 4.2 mmol/L (ref 3.5–5.2)
Sodium: 138 mmol/L (ref 134–144)

## 2013-02-10 LAB — CBC WITH DIFFERENTIAL
Basophils Absolute: 0 10*3/uL (ref 0.0–0.2)
Basos: 1 %
Eos: 2 %
Eosinophils Absolute: 0.1 10*3/uL (ref 0.0–0.4)
HCT: 33.6 % — ABNORMAL LOW (ref 34.0–46.6)
Hemoglobin: 11.3 g/dL (ref 11.1–15.9)
Immature Grans (Abs): 0 10*3/uL (ref 0.0–0.1)
Immature Granulocytes: 0 %
Lymphocytes Absolute: 2 10*3/uL (ref 0.7–3.1)
Lymphs: 37 %
MCH: 30.1 pg (ref 26.6–33.0)
MCHC: 33.6 g/dL (ref 31.5–35.7)
MCV: 89 fL (ref 79–97)
Monocytes Absolute: 0.5 10*3/uL (ref 0.1–0.9)
Monocytes: 9 %
Neutrophils Absolute: 2.8 10*3/uL (ref 1.4–7.0)
Neutrophils Relative %: 51 %
Platelets: 263 10*3/uL (ref 150–379)
RBC: 3.76 x10E6/uL — ABNORMAL LOW (ref 3.77–5.28)
RDW: 13.3 % (ref 12.3–15.4)
WBC: 5.3 10*3/uL (ref 3.4–10.8)

## 2013-02-10 LAB — NMR, LIPOPROFILE
Cholesterol: 271 mg/dL — ABNORMAL HIGH (ref <200)
HDL Cholesterol by NMR: 51 mg/dL (ref >=40)
HDL Particle Number: 37 umol/L (ref >=30.5)
LDL Particle Number: 2601 nmol/L — ABNORMAL HIGH (ref <1000)
LDL Size: 20.5 nm — ABNORMAL LOW (ref >20.5)
LDLC SERPL CALC-MCNC: 174 mg/dL — ABNORMAL HIGH (ref <100)
LP-IR Score: 60 — ABNORMAL HIGH (ref <=45)
Small LDL Particle Number: 1388 nmol/L — ABNORMAL HIGH (ref <=527)
Triglycerides by NMR: 230 mg/dL — ABNORMAL HIGH (ref <150)

## 2013-02-10 LAB — HEPATIC FUNCTION PANEL
ALT: 10 IU/L (ref 0–32)
AST: 14 IU/L (ref 0–40)
Albumin: 4.2 g/dL (ref 3.5–4.8)
Alkaline Phosphatase: 79 IU/L (ref 39–117)
Bilirubin, Direct: 0.07 mg/dL (ref 0.00–0.40)
Total Bilirubin: 0.2 mg/dL (ref 0.0–1.2)
Total Protein: 6.6 g/dL (ref 6.0–8.5)

## 2013-02-10 LAB — VITAMIN D 25 HYDROXY (VIT D DEFICIENCY, FRACTURES): Vit D, 25-Hydroxy: 26.5 ng/mL — ABNORMAL LOW (ref 30.0–100.0)

## 2013-02-14 ENCOUNTER — Other Ambulatory Visit: Payer: Self-pay | Admitting: Nurse Practitioner

## 2013-03-30 ENCOUNTER — Other Ambulatory Visit: Payer: Medicare Other

## 2013-03-30 ENCOUNTER — Ambulatory Visit: Payer: Medicare Other

## 2013-04-20 ENCOUNTER — Encounter: Payer: Self-pay | Admitting: Pharmacist

## 2013-04-20 ENCOUNTER — Ambulatory Visit (INDEPENDENT_AMBULATORY_CARE_PROVIDER_SITE_OTHER): Payer: Medicare Other | Admitting: Pharmacist

## 2013-04-20 ENCOUNTER — Ambulatory Visit (INDEPENDENT_AMBULATORY_CARE_PROVIDER_SITE_OTHER): Payer: Medicare Other

## 2013-04-20 VITALS — Ht 63.0 in | Wt 165.0 lb

## 2013-04-20 DIAGNOSIS — Z780 Asymptomatic menopausal state: Secondary | ICD-10-CM

## 2013-04-20 DIAGNOSIS — M899 Disorder of bone, unspecified: Secondary | ICD-10-CM

## 2013-04-20 DIAGNOSIS — E559 Vitamin D deficiency, unspecified: Secondary | ICD-10-CM

## 2013-04-20 DIAGNOSIS — M949 Disorder of cartilage, unspecified: Secondary | ICD-10-CM

## 2013-04-20 DIAGNOSIS — M85831 Other specified disorders of bone density and structure, right forearm: Secondary | ICD-10-CM | POA: Insufficient documentation

## 2013-04-20 DIAGNOSIS — M8580 Other specified disorders of bone density and structure, unspecified site: Secondary | ICD-10-CM | POA: Insufficient documentation

## 2013-04-20 HISTORY — DX: Other specified disorders of bone density and structure, unspecified site: M85.80

## 2013-04-20 NOTE — Progress Notes (Signed)
Patient ID: Gabriela Smith, female   DOB: 1940-09-29, 73 y.o.   MRN: [id number] [facility] Current Height: Height: 5\' 3"  (160 cm)      Max Lifetime Height:  5\' 4"  Current Weight: Weight: 165 lb (74.844 kg)       Ethnicity:Caucasian    HPI: Does pt already have a diagnosis of:  Osteopenia?  Yes Osteoporosis?  No  Back Pain?  Yes previously but pain resolved after hip surgery  Kyphosis?  No Prior fracture?  No Med(s) for Osteoporosis/Osteopenia:  none Med(s) previously tried for Osteoporosis/Osteopenia:  Evista - caused pain, vitamin D - causes myalgias, Boniva - caused pain all over                                                             PMH: Age at menopause:  30's Hysterectomy?  No Oophorectomy?  No HRT? No Steroid Use?  No Thyroid med?  No History of cancer?  Yes - bladder cancer History of digestive disorders (ie Crohn's)?  No Current or previous eating disorders?  No Last Vitamin D Result:  26.5 (02/08/2013) Last GFR Result:  83 (02/08/2013)   FH/SH: Family history of osteoporosis?   - yes - sister Parent with history of hip fracture?  No Family history of breast cancer?  No Exercise?  Yes - walk 3 days per week Smoking?  No Alcohol?  Wine - about 3 times per week    Calcium Assessment Calcium Intake  # of servings/day  Calcium mg  Milk (8 oz) 1  x  300  = 300mg   Yogurt (4 oz) 1 x  200 = 200mg   Cheese (1 oz) 1 x  200 = 200mg   Other Calcium sources   250mg   Ca supplement 0 = 0   Estimated calcium intake per day 950mg     DEXA Results Date of Test T-Score for AP Spine L1-L4 T-Score for Total Left Hip T-Score for Total Right Hip  04/20/2013 -0.6 -1.8 -1.7  01/08/2011 -0.2 -1.4 -1.5  05/10/2008 -0.2 -1.2 -1.7  03/16/2006 -0.6 -1.0 -1.3   FRAX 10 year estimate: Total FX risk:  17%  (consider medication if >/= 20%) Hip FX risk:  57%  (consider medication if >/= 3%)  Assessment: OSTEOPENIA with high estimated fracture risk  Vitamin D deficiency -  has been unable to tolerated vitamin D supplementation in the past  Recommendations: 1.  Risk and benefit of treatment options discussed.  Patient has tried several medications for decreased BMD in the past but has not been able to tolerate.  She currently refuses prescription medication for osteopenia 2.  recommend calcium 1200mg  daily through supplementation or diet.  Patient to start MVI which also will have a little vitamin D.  She has been able to tolerate MVI in past 3.  recommend weight bearing exercise - 30 minutes at least 4 days per week.   4.  Counseled and educated about fall risk and prevention.  Recheck DEXA:  1 year  Time spent counseling patient:  30 minutes  Cherre Robins, PharmD, CPP

## 2013-04-27 ENCOUNTER — Other Ambulatory Visit: Payer: Self-pay | Admitting: Family Medicine

## 2013-05-23 ENCOUNTER — Ambulatory Visit (INDEPENDENT_AMBULATORY_CARE_PROVIDER_SITE_OTHER): Payer: Medicare Other | Admitting: Family Medicine

## 2013-05-23 ENCOUNTER — Encounter: Payer: Self-pay | Admitting: Family Medicine

## 2013-05-23 VITALS — BP 126/82 | HR 86 | Temp 97.9°F | Ht 63.0 in | Wt 172.0 lb

## 2013-05-23 DIAGNOSIS — I2510 Atherosclerotic heart disease of native coronary artery without angina pectoris: Secondary | ICD-10-CM

## 2013-05-23 DIAGNOSIS — Z789 Other specified health status: Secondary | ICD-10-CM | POA: Insufficient documentation

## 2013-05-23 DIAGNOSIS — Z8551 Personal history of malignant neoplasm of bladder: Secondary | ICD-10-CM

## 2013-05-23 DIAGNOSIS — E785 Hyperlipidemia, unspecified: Secondary | ICD-10-CM

## 2013-05-23 DIAGNOSIS — I10 Essential (primary) hypertension: Secondary | ICD-10-CM

## 2013-05-23 DIAGNOSIS — E559 Vitamin D deficiency, unspecified: Secondary | ICD-10-CM

## 2013-05-23 LAB — POCT CBC
Granulocyte percent: 57.1 %G (ref 37–80)
HCT, POC: 38.8 % (ref 37.7–47.9)
Hemoglobin: 12.3 g/dL (ref 12.2–16.2)
Lymph, poc: 1.8 (ref 0.6–3.4)
MCH, POC: 29.7 pg (ref 27–31.2)
MCHC: 31.8 g/dL (ref 31.8–35.4)
MCV: 93.5 fL (ref 80–97)
MPV: 7.6 fL (ref 0–99.8)
POC Granulocyte: 2.8 (ref 2–6.9)
POC LYMPH PERCENT: 37.2 %L (ref 10–50)
Platelet Count, POC: 242 10*3/uL (ref 142–424)
RBC: 4.2 M/uL (ref 4.04–5.48)
RDW, POC: 14.4 %
WBC: 4.9 10*3/uL (ref 4.6–10.2)

## 2013-05-23 NOTE — Progress Notes (Signed)
Subjective:    Patient ID: Gabriela Smith, female    DOB: July 04, 1940, 73 y.o.   MRN: [id number]  HPI Pt here for follow up and management of chronic medical problems. The patient comes in today for her regular followup. She is seeing the urologist for her bladder cancer every 3 months. She has had 2 spots removed recently. She will get lab work today and will be given an FOBT to return. She is due to get her colonoscopy this year. She prefers to come every 6 months for her visits and in light of the frequent visits she is making to the doctor and she will be scheduled to come back again in 6 months. It OB given a Prevnar vaccine when she returns in about 6 months. The patient indicates that her daughter, who is 87 years old, and overweight had recent coronary artery bypass surgery x5   3 weeks ago.        Patient Active Problem List   Diagnosis Date Noted  . Osteopenia 04/20/2013  . Degenerative disc disease, lumbar 02/08/2013  . Vitamin D deficiency 02/08/2013  . Overweight (BMI 25.0-29.9) 11/24/2012  . S/P left TKA 11/22/2012  . History of bladder cancer 11/04/2012  . Osteoarthritis of left knee 11/04/2012  . Hyperlipidemia 11/28/2008  . Hypertension 11/28/2008  . CORONARY ATHEROSCLEROSIS NATIVE CORONARY ARTERY 11/28/2008   Outpatient Encounter Prescriptions as of 05/23/2013  Medication Sig  . co-enzyme Q-10 50 MG capsule Take 50 mg by mouth daily.  . Flaxseed, Linseed, (FLAX SEED OIL) 1000 MG CAPS Take 1 capsule by mouth daily.   . hydrochlorothiazide (MICROZIDE) 12.5 MG capsule TAKE (1) CAPSULE DAILY  . metoprolol tartrate (LOPRESSOR) 25 MG tablet TAKE  (1)  TABLET TWICE A DAY.  . Multiple Vitamin (MULTIVITAMIN WITH MINERALS) TABS tablet Take 1 tablet by mouth daily.    Review of Systems  Constitutional: Negative.   HENT: Negative.   Eyes: Negative.   Respiratory: Negative.   Cardiovascular: Negative.   Gastrointestinal: Negative.   Endocrine: Negative.     Genitourinary: Negative.   Musculoskeletal: Negative.   Skin: Negative.   Allergic/Immunologic: Negative.   Neurological: Negative.   Hematological: Negative.   Psychiatric/Behavioral: Negative.        Objective:   Physical Exam  Nursing note and vitals reviewed. Constitutional: She is oriented to person, place, and time. She appears well-developed and well-nourished. No distress.  Pleasant and cooperative  HENT:  Head: Normocephalic and atraumatic.  Right Ear: External ear normal.  Left Ear: External ear normal.  Nose: Nose normal.  Mouth/Throat: Oropharynx is clear and moist.  Eyes: Conjunctivae and EOM are normal. Pupils are equal, round, and reactive to light. Right eye exhibits no discharge. Left eye exhibits no discharge. No scleral icterus.  Neck: Normal range of motion. Neck supple. No thyromegaly present.  No carotid bruits  Cardiovascular: Normal rate, regular rhythm, normal heart sounds and intact distal pulses.   No murmur heard. At 72 per minute  Pulmonary/Chest: Effort normal and breath sounds normal. No respiratory distress. She has no wheezes. She has no rales. She exhibits no tenderness.  Abdominal: Soft. Bowel sounds are normal. She exhibits no mass. There is no tenderness. There is no rebound and no guarding.  Mild obesity  Musculoskeletal: Normal range of motion. She exhibits no edema and no tenderness.  Lymphadenopathy:    She has no cervical adenopathy.  Neurological: She is alert and oriented to person, place, and time. She has normal reflexes.  No cranial nerve deficit.  Skin: Skin is warm and dry.  Psychiatric: She has a normal mood and affect. Her behavior is normal. Judgment and thought content normal.   BP 126/82  Pulse 86  Temp(Src) 97.9 F (36.6 C) (Oral)  Ht '5\' 3"'  (1.6 m)  Wt 172 lb (78.019 kg)  BMI 30.48 kg/m2        Assessment & Plan:  1. CORONARY ATHEROSCLEROSIS NATIVE CORONARY ARTERY - POCT CBC  2. History of bladder cancer -  POCT CBC  3. Hyperlipidemia - POCT CBC - NMR, lipoprofile  4. Hypertension - POCT CBC - BMP8+EGFR - Hepatic function panel  5. Vitamin D deficiency - POCT CBC - Vit D  25 hydroxy (rtn osteoporosis monitoring)  6. Statin intolerance  Patient Instructions                       Medicare Annual Wellness Visit  [facility] and the medical providers at [facility] strive to bring you the best medical care.  In doing so we not only want to address your current medical conditions and concerns but also to detect new conditions early and prevent illness, disease and health-related problems.    Medicare offers a yearly Wellness Visit which allows our clinical staff to assess your need for preventative services including immunizations, lifestyle education, counseling to decrease risk of preventable diseases and screening for fall risk and other medical concerns.    This visit is provided free of charge (no copay) for all Medicare recipients. The clinical pharmacists at [facility] have begun to conduct these Wellness Visits which will also include a thorough review of all your medications.    As you primary medical provider recommend that you make an appointment for your Annual Wellness Visit if you have not done so already this year.  You may set up this appointment before you leave today or you may call back ([phone]) and schedule an appointment.  Please make sure when you call that you mention that you are scheduling your Annual Wellness Visit with the clinical pharmacist so that the appointment may be made for the proper length of time.     Continue current medications. Continue good therapeutic lifestyle changes which include good diet and exercise. Fall precautions discussed with patient. If an FOBT was given today- please return it to our front desk. If you are over 77 years old - you may need Prevnar 43 or the adult Pneumonia  vaccine.  Continue followup with urologist We will give you your Prevnar vaccine in 6 month We will call you with the lab work results once those results are available you are due to get a colonoscopy this year Please return the FOBT     Arrie Senate MD

## 2013-05-23 NOTE — Patient Instructions (Addendum)
Medicare Annual Wellness Visit  [facility] and the medical providers at [facility] strive to bring you the best medical care.  In doing so we not only want to address your current medical conditions and concerns but also to detect new conditions early and prevent illness, disease and health-related problems.    Medicare offers a yearly Wellness Visit which allows our clinical staff to assess your need for preventative services including immunizations, lifestyle education, counseling to decrease risk of preventable diseases and screening for fall risk and other medical concerns.    This visit is provided free of charge (no copay) for all Medicare recipients. The clinical pharmacists at [facility] have begun to conduct these Wellness Visits which will also include a thorough review of all your medications.    As you primary medical provider recommend that you make an appointment for your Annual Wellness Visit if you have not done so already this year.  You may set up this appointment before you leave today or you may call back ([phone]) and schedule an appointment.  Please make sure when you call that you mention that you are scheduling your Annual Wellness Visit with the clinical pharmacist so that the appointment may be made for the proper length of time.     Continue current medications. Continue good therapeutic lifestyle changes which include good diet and exercise. Fall precautions discussed with patient. If an FOBT was given today- please return it to our front desk. If you are over 92 years old - you may need Prevnar 36 or the adult Pneumonia vaccine.  Continue followup with urologist We will give you your Prevnar vaccine in 6 month We will call you with the lab work results once those results are available you are due to get a colonoscopy this year Please return the FOBT

## 2013-05-24 LAB — BMP8+EGFR
BUN/Creatinine Ratio: 23 (ref 11–26)
BUN: 16 mg/dL (ref 8–27)
CO2: 24 mmol/L (ref 18–29)
Calcium: 9.8 mg/dL (ref 8.7–10.3)
Chloride: 99 mmol/L (ref 97–108)
Creatinine, Ser: 0.69 mg/dL (ref 0.57–1.00)
GFR calc Af Amer: 99 mL/min/{1.73_m2} (ref >59)
GFR calc non Af Amer: 86 mL/min/{1.73_m2} (ref >59)
Glucose: 98 mg/dL (ref 65–99)
Potassium: 4.3 mmol/L (ref 3.5–5.2)
Sodium: 139 mmol/L (ref 134–144)

## 2013-05-24 LAB — HEPATIC FUNCTION PANEL
ALT: 15 IU/L (ref 0–32)
AST: 13 IU/L (ref 0–40)
Albumin: 4.5 g/dL (ref 3.5–4.8)
Alkaline Phosphatase: 71 IU/L (ref 39–117)
Bilirubin, Direct: 0.05 mg/dL (ref 0.00–0.40)
Total Bilirubin: 0.3 mg/dL (ref 0.0–1.2)
Total Protein: 7.3 g/dL (ref 6.0–8.5)

## 2013-05-24 LAB — NMR, LIPOPROFILE
Cholesterol: 340 mg/dL — ABNORMAL HIGH (ref <200)
HDL Cholesterol by NMR: 59 mg/dL (ref >=40)
HDL Particle Number: 34 umol/L (ref >=30.5)
LDL Particle Number: >3500 nmol/L — ABNORMAL HIGH (ref <1000)
LDL Size: 21 nm (ref >20.5)
LDLC SERPL CALC-MCNC: 223 mg/dL — ABNORMAL HIGH (ref <100)
LP-IR Score: 72 — ABNORMAL HIGH (ref <=45)
Small LDL Particle Number: 1814 nmol/L — ABNORMAL HIGH (ref <=527)
Triglycerides by NMR: 292 mg/dL — ABNORMAL HIGH (ref <150)

## 2013-05-24 LAB — VITAMIN D 25 HYDROXY (VIT D DEFICIENCY, FRACTURES): Vit D, 25-Hydroxy: 24.4 ng/mL — ABNORMAL LOW (ref 30.0–100.0)

## 2013-05-26 ENCOUNTER — Telehealth: Payer: Self-pay | Admitting: Family Medicine

## 2013-05-26 NOTE — Telephone Encounter (Signed)
What kind of letter does she need or do just need to fill out paper work

## 2013-05-26 NOTE — Telephone Encounter (Signed)
Patient has been called for jury duty and needs letter about her jury duty and patient will bring form to [place] to fill out

## 2013-05-27 DIAGNOSIS — Z0289 Encounter for other administrative examinations: Secondary | ICD-10-CM

## 2013-06-06 ENCOUNTER — Other Ambulatory Visit: Payer: Self-pay

## 2013-06-17 LAB — FECAL OCCULT BLOOD, IMMUNOCHEMICAL: Fecal Occult Bld: NEGATIVE

## 2013-07-01 ENCOUNTER — Encounter: Payer: Self-pay | Admitting: *Deleted

## 2013-08-09 ENCOUNTER — Other Ambulatory Visit: Payer: Self-pay | Admitting: Family Medicine

## 2013-08-29 ENCOUNTER — Other Ambulatory Visit: Payer: Self-pay | Admitting: Family Medicine

## 2013-11-04 ENCOUNTER — Other Ambulatory Visit: Payer: Self-pay | Admitting: Family Medicine

## 2013-11-23 ENCOUNTER — Ambulatory Visit: Payer: Medicare Other | Admitting: Family Medicine

## 2013-11-25 ENCOUNTER — Ambulatory Visit: Payer: Medicare Other | Admitting: Family Medicine

## 2013-11-28 ENCOUNTER — Encounter: Payer: Self-pay | Admitting: Family Medicine

## 2013-11-28 ENCOUNTER — Ambulatory Visit (INDEPENDENT_AMBULATORY_CARE_PROVIDER_SITE_OTHER): Payer: Medicare Other | Admitting: Family Medicine

## 2013-11-28 VITALS — BP 158/86 | HR 86 | Temp 97.9°F | Ht 63.0 in | Wt 173.0 lb

## 2013-11-28 DIAGNOSIS — E559 Vitamin D deficiency, unspecified: Secondary | ICD-10-CM

## 2013-11-28 DIAGNOSIS — Z8551 Personal history of malignant neoplasm of bladder: Secondary | ICD-10-CM

## 2013-11-28 DIAGNOSIS — I10 Essential (primary) hypertension: Secondary | ICD-10-CM

## 2013-11-28 DIAGNOSIS — E785 Hyperlipidemia, unspecified: Secondary | ICD-10-CM

## 2013-11-28 LAB — POCT CBC
Granulocyte percent: 55.3 %G (ref 37–80)
HCT, POC: 36.7 % — AB (ref 37.7–47.9)
Hemoglobin: 12.5 g/dL (ref 12.2–16.2)
Lymph, poc: 2 (ref 0.6–3.4)
MCH, POC: 31.7 pg — AB (ref 27–31.2)
MCHC: 34.1 g/dL (ref 31.8–35.4)
MCV: 93.1 fL (ref 80–97)
MPV: 7.9 fL (ref 0–99.8)
POC Granulocyte: 2.9 (ref 2–6.9)
POC LYMPH PERCENT: 37.8 %L (ref 10–50)
Platelet Count, POC: 211 10*3/uL (ref 142–424)
RBC: 4 M/uL — AB (ref 4.04–5.48)
RDW, POC: 13.2 %
WBC: 5.3 10*3/uL (ref 4.6–10.2)

## 2013-11-28 MED ORDER — METOPROLOL TARTRATE 25 MG PO TABS: ORAL_TABLET | ORAL | Status: DC

## 2013-11-28 MED ORDER — HYDROCHLOROTHIAZIDE 25 MG PO TABS: 25.0000 mg | ORAL_TABLET | Freq: Every day | ORAL | Status: DC

## 2013-11-28 MED ORDER — HYDROCHLOROTHIAZIDE 12.5 MG PO CAPS: ORAL_CAPSULE | ORAL | Status: DC

## 2013-11-28 NOTE — Progress Notes (Signed)
Subjective:    Patient ID: Gabriela Smith, female    DOB: [birth year], 73 y.o.   MRN: [id number]  HPI Pt here for follow up and management of chronic medical problems.  The patient has no complaints. She is due to get lab work today. She continues to be followed by the urologist for her bladder cancer. She would also like refills on all her medication. It is important to note that she is statin intolerant and may benefit from the new injectable drug for hyperlipidemia. The patient notes that her blood pressures at home run in the 150s over the 80s. 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       Patient Active Problem List   Diagnosis Date Noted  . Statin intolerance 05/23/2013  . Osteopenia 04/20/2013  . Degenerative disc disease, lumbar 02/08/2013  . Vitamin D deficiency 02/08/2013  . Overweight (BMI 25.0-29.9) 11/24/2012  . S/P left TKA 11/22/2012  . History of bladder cancer 11/04/2012  . Osteoarthritis of left knee 11/04/2012  . Hyperlipidemia 11/28/2008  . Hypertension 11/28/2008  . CORONARY ATHEROSCLEROSIS NATIVE CORONARY ARTERY 11/28/2008   Outpatient Encounter Prescriptions as of 11/28/2013  Medication Sig  . co-enzyme Q-10 50 MG capsule Take 50 mg by mouth daily.  . Flaxseed, Linseed, (FLAX SEED OIL) 1000 MG CAPS Take 1 capsule by mouth daily.   . hydrochlorothiazide (MICROZIDE) 12.5 MG capsule TAKE (1) CAPSULE DAILY  . metoprolol tartrate (LOPRESSOR) 25 MG tablet TAKE (1) TABLET TWICE A DAY.  . Multiple Vitamin (MULTIVITAMIN WITH MINERALS) TABS tablet Take 1 tablet by mouth daily.    Review of Systems  Constitutional: Negative.   HENT: Negative.   Eyes: Negative.   Respiratory: Negative.   Cardiovascular: Negative.   Gastrointestinal: Negative.   Endocrine: Negative.   Genitourinary: Negative.   Musculoskeletal: Negative.   Skin: Negative.   Allergic/Immunologic: Negative.     Neurological: Negative.   Hematological: Negative.   Psychiatric/Behavioral: Negative.        Objective:   Physical Exam  Nursing note and vitals reviewed. Constitutional: She is oriented to person, place, and time. She appears well-developed and well-nourished. No distress.  The patient is pleasant and cooperative and alert  HENT:  Head: Normocephalic and atraumatic.  Right Ear: External ear normal.  Left Ear: External ear normal.  Mouth/Throat: Oropharynx is clear and moist.  There is nasal congestion bilateral  Eyes: Conjunctivae and EOM are normal. Pupils are equal, round, and reactive to light. Right eye exhibits no discharge. Left eye exhibits no discharge. No scleral icterus.  Neck: Normal range of motion. Neck supple. No thyromegaly present.  Cardiovascular: Normal rate, regular rhythm, normal heart sounds and intact distal pulses.  Exam reveals no gallop and no friction rub.   No murmur heard. At 72 per minute  Pulmonary/Chest: Effort normal and breath sounds normal. No respiratory distress. She has no wheezes. She has no rales. She exhibits no tenderness.  Abdominal: Soft. Bowel sounds are normal. She exhibits no mass. There is no tenderness. There is no rebound and no guarding.  Musculoskeletal: Normal range of motion. She exhibits no edema and no tenderness.  Lymphadenopathy:    She has no cervical adenopathy.  Neurological: She is alert and oriented to person, place, and time. She has normal reflexes. No cranial nerve deficit.  Skin: Skin is warm and dry. No rash noted.  Psychiatric: She has a normal mood  and affect. Her behavior is normal. Judgment and thought content normal.   BP 154/80  Pulse 86  Temp(Src) 97.9 F (36.6 C) (Oral)  Ht $R'5\' 3"'RS$  (1.6 m)  Wt 173 lb (78.472 kg)  BMI 30.65 kg/m2        Assessment & Plan:  1. Hyperlipidemia - POCT CBC - NMR, lipoprofile  2. Hypertension - POCT CBC - BMP8+EGFR - Hepatic function panel - hydrochlorothiazide  (HYDRODIURIL) 25 MG tablet; Take 1 tablet (25 mg total) by mouth daily.  Dispense: 90 tablet; Refill: 3  3. Vitamin D deficiency - POCT CBC - Vit D  25 hydroxy (rtn osteoporosis monitoring)  4. History of bladder cancer - POCT CBC  Meds ordered this encounter  Medications  . metoprolol tartrate (LOPRESSOR) 25 MG tablet    Sig: TAKE (1) TABLET TWICE A DAY.    Dispense:  180 tablet    Refill:  3  . DISCONTD: hydrochlorothiazide (MICROZIDE) 12.5 MG capsule    Sig: TAKE (1) CAPSULE DAILY    Dispense:  90 capsule    Refill:  3  . hydrochlorothiazide (HYDRODIURIL) 25 MG tablet    Sig: Take 1 tablet (25 mg total) by mouth daily.    Dispense:  90 tablet    Refill:  3   Patient Instructions                       Medicare Annual Wellness Visit  [facility] and the medical providers at [facility] strive to bring you the best medical care.  In doing so we not only want to address your current medical conditions and concerns but also to detect new conditions early and prevent illness, disease and health-related problems.    Medicare offers a yearly Wellness Visit which allows our clinical staff to assess your need for preventative services including immunizations, lifestyle education, counseling to decrease risk of preventable diseases and screening for fall risk and other medical concerns.    This visit is provided free of charge (no copay) for all Medicare recipients. The clinical pharmacists at [facility] have begun to conduct these Wellness Visits which will also include a thorough review of all your medications.    As you primary medical provider recommend that you make an appointment for your Annual Wellness Visit if you have not done so already this year.  You may set up this appointment before you leave today or you may call back ([phone]) and schedule an appointment.  Please make sure when you call that you mention that you are scheduling  your Annual Wellness Visit with the clinical pharmacist so that the appointment may be made for the proper length of time.     Continue current medications. Continue good therapeutic lifestyle changes which include good diet and exercise. Fall precautions discussed with patient. If an FOBT was given today- please return it to our front desk. If you are over 47 years old - you may need Prevnar 37 or the adult Pneumonia vaccine.  Flu Shots will be available at our office starting mid- September. Please call and schedule a FLU CLINIC APPOINTMENT.   Use Flonase over-the-counter 1-2 sprays each nostril at bedtime Increase hydrochlorothiazide to 25 mg daily Bring blood pressures by for review in 4 weeks and get a BMP Reviewed information regarding injectable medicine for controlling hyperlipidemia, if interested please call back and schedule a visit with the clinical pharmacists, Vickki Muff  MD   

## 2013-11-28 NOTE — Patient Instructions (Addendum)
Medicare Annual Wellness Visit  [facility] and the medical providers at [facility] strive to bring you the best medical care.  In doing so we not only want to address your current medical conditions and concerns but also to detect new conditions early and prevent illness, disease and health-related problems.    Medicare offers a yearly Wellness Visit which allows our clinical staff to assess your need for preventative services including immunizations, lifestyle education, counseling to decrease risk of preventable diseases and screening for fall risk and other medical concerns.    This visit is provided free of charge (no copay) for all Medicare recipients. The clinical pharmacists at [facility] have begun to conduct these Wellness Visits which will also include a thorough review of all your medications.    As you primary medical provider recommend that you make an appointment for your Annual Wellness Visit if you have not done so already this year.  You may set up this appointment before you leave today or you may call back ([phone]) and schedule an appointment.  Please make sure when you call that you mention that you are scheduling your Annual Wellness Visit with the clinical pharmacist so that the appointment may be made for the proper length of time.     Continue current medications. Continue good therapeutic lifestyle changes which include good diet and exercise. Fall precautions discussed with patient. If an FOBT was given today- please return it to our front desk. If you are over 72 years old - you may need Prevnar 34 or the adult Pneumonia vaccine.  Flu Shots will be available at our office starting mid- September. Please call and schedule a FLU CLINIC APPOINTMENT.   Use Flonase over-the-counter 1-2 sprays each nostril at bedtime Increase hydrochlorothiazide to 25 mg daily Bring blood pressures by for review in 4  weeks and get a BMP Reviewed information regarding injectable medicine for controlling hyperlipidemia, if interested please call back and schedule a visit with the clinical pharmacists, Sharyn Lull

## 2013-11-29 LAB — BMP8+EGFR
BUN/Creatinine Ratio: 25 (ref 11–26)
BUN: 19 mg/dL (ref 8–27)
CO2: 24 mmol/L (ref 18–29)
Calcium: 9.4 mg/dL (ref 8.7–10.3)
Chloride: 101 mmol/L (ref 97–108)
Creatinine, Ser: 0.76 mg/dL (ref 0.57–1.00)
GFR calc Af Amer: 89 mL/min/{1.73_m2} (ref >59)
GFR calc non Af Amer: 77 mL/min/{1.73_m2} (ref >59)
Glucose: 88 mg/dL (ref 65–99)
Potassium: 4.5 mmol/L (ref 3.5–5.2)
Sodium: 142 mmol/L (ref 134–144)

## 2013-11-29 LAB — NMR, LIPOPROFILE
Cholesterol: 308 mg/dL — ABNORMAL HIGH (ref 100–199)
HDL Cholesterol by NMR: 52 mg/dL (ref >39)
HDL Particle Number: 32.9 umol/L (ref >=30.5)
LDL Particle Number: 2727 nmol/L — ABNORMAL HIGH (ref <1000)
LDL Size: 20.7 nm (ref >20.5)
LDLC SERPL CALC-MCNC: 205 mg/dL — ABNORMAL HIGH (ref 0–99)
LP-IR Score: 72 — ABNORMAL HIGH (ref <=45)
Small LDL Particle Number: 1250 nmol/L — ABNORMAL HIGH (ref <=527)
Triglycerides by NMR: 253 mg/dL — ABNORMAL HIGH (ref 0–149)

## 2013-11-29 LAB — VITAMIN D 25 HYDROXY (VIT D DEFICIENCY, FRACTURES): Vit D, 25-Hydroxy: 32.6 ng/mL (ref 30.0–100.0)

## 2013-11-29 LAB — HEPATIC FUNCTION PANEL
ALT: 14 IU/L (ref 0–32)
AST: 16 IU/L (ref 0–40)
Albumin: 4.5 g/dL (ref 3.5–4.8)
Alkaline Phosphatase: 65 IU/L (ref 39–117)
Bilirubin, Direct: 0.05 mg/dL (ref 0.00–0.40)
Total Bilirubin: 0.3 mg/dL (ref 0.0–1.2)
Total Protein: 6.9 g/dL (ref 6.0–8.5)

## 2013-11-30 MED ORDER — ATORVASTATIN CALCIUM 10 MG PO TABS: 10.0000 mg | ORAL_TABLET | Freq: Every day | ORAL | Status: DC

## 2013-11-30 NOTE — Addendum Note (Signed)
Addended by: Zannie Cove on: 11/30/2013 05:05 PM   Modules accepted: Orders

## 2013-12-20 ENCOUNTER — Ambulatory Visit (INDEPENDENT_AMBULATORY_CARE_PROVIDER_SITE_OTHER): Payer: Medicare Other | Admitting: Pharmacist Clinician (PhC)/ Clinical Pharmacy Specialist

## 2013-12-20 ENCOUNTER — Encounter: Payer: Self-pay | Admitting: Pharmacist Clinician (PhC)/ Clinical Pharmacy Specialist

## 2013-12-20 DIAGNOSIS — Z889 Allergy status to unspecified drugs, medicaments and biological substances status: Secondary | ICD-10-CM

## 2013-12-20 DIAGNOSIS — Z789 Other specified health status: Secondary | ICD-10-CM

## 2013-12-20 DIAGNOSIS — E785 Hyperlipidemia, unspecified: Secondary | ICD-10-CM

## 2013-12-20 DIAGNOSIS — E7849 Other hyperlipidemia: Secondary | ICD-10-CM

## 2013-12-20 MED ORDER — PITAVASTATIN CALCIUM 2 MG PO TABS: 2.0000 mg | ORAL_TABLET | Freq: Every day | ORAL | Status: DC

## 2013-12-20 NOTE — Progress Notes (Signed)
   Subjective:    Patient ID: Gabriela Smith, female    DOB: 1940-10-10, 73 y.o.   MRN: [id number]  HPI    Review of Systems  Constitutional: Negative.   HENT: Negative.   Eyes: Negative.   Respiratory: Negative.   Cardiovascular: Negative.   Endocrine: Negative.   Skin: Negative.   Allergic/Immunologic: Negative.   Psychiatric/Behavioral: Negative.        Objective:   Physical Exam  Constitutional: She is oriented to person, place, and time. She appears well-developed and well-nourished.  Cardiovascular: Normal rate and regular rhythm.   Neurological: She is oriented to person, place, and time.  Skin: Skin is warm and dry.  Psychiatric: She has a normal mood and affect. Her behavior is normal. Judgment and thought content normal.          Assessment & Plan:   Lipid Clinic Consultation  Chief Complaint:  No chief complaint on file.    Exam  Carotid Bruits:  neg Xanthomas:  neg General Appearance:  alert, oriented, no acute distress Mood/Affect:  normal  HPI:  Long standing hyperlipidemia and ASCVD.  Daughter had an MI in her 15's.  Strong family history of heart disease at a young age (MIs) and elevated cholesterol.     Component Value Date/Time   CHOL 308* 11/28/2013 1215   TRIG 253* 11/28/2013 1215   HDL 52 11/28/2013 1215    Assessment: CHD/CHF Risk Equivalents:  ASCUD [place] 66yrs risk:    NCEP Risk Factors Present:  HTN, family history and age Primary Problem(s):  LDL or LDL-P elevated  Current NCEP Goals: LDL Goal < 70 HDL Goal >/= 40 Tg Goal < 150 Non-HDL Goal < 100  Secondary cause of hyperlipidemia present:  None identified Low fat diet followed?  Yes -  Fairly well Low carb diet followed?  No -   Exercise?  No - very little exercise  Recommendations: Changes in lipid medication(s):  Patient is unsure if the HCTZ dose doubled to 50mg  or the atorvastatin caused her to feel bad.  Her symptoms resolved when she cut back down to HCTZ  25mg  and stopped atorvastatin.  Past intolerances to statins were myalgias not a true allergic reaction per patient.  Will try Livalo 2mg  qd and in the mean time counseled patient on Repatha and filled out necessary paperwork so that if she fails Livalo we can go this route.  She is agreeable as long as the cost is not prohibitive.  Recheck Lipid Panel:  6 week Other labs needed:  TSH, A1C  Time spent counseling patient:  45 minutes  Physician time spent with patient:    Referring Provider:  Laurance Flatten   PharmD:  [facility] Pharmacist

## 2014-01-04 ENCOUNTER — Ambulatory Visit (INDEPENDENT_AMBULATORY_CARE_PROVIDER_SITE_OTHER): Payer: Medicare Other

## 2014-01-04 DIAGNOSIS — Z23 Encounter for immunization: Secondary | ICD-10-CM

## 2014-01-12 ENCOUNTER — Other Ambulatory Visit: Payer: Self-pay | Admitting: *Deleted

## 2014-01-12 ENCOUNTER — Telehealth: Payer: Self-pay | Admitting: Pharmacist

## 2014-01-12 MED ORDER — ATORVASTATIN CALCIUM 10 MG PO TABS: 10.0000 mg | ORAL_TABLET | Freq: Every day | ORAL | Status: DC

## 2014-01-12 NOTE — Telephone Encounter (Signed)
Pharmacy received communication from [employer] company recommending 90 day supply for atorvastatin.  Rx was given to [facility] earlier today.  However 12/23/2013 atorvastatin was discontinued by Memory Argue, PharmD, CPP and Livalo 2mg  started.   Atorvastatin rx was cancelled by calling [facility].  Removed for medication list

## 2014-01-16 ENCOUNTER — Other Ambulatory Visit: Payer: Self-pay

## 2014-02-13 ENCOUNTER — Other Ambulatory Visit: Payer: Self-pay | Admitting: Urology

## 2014-03-21 ENCOUNTER — Encounter (HOSPITAL_BASED_OUTPATIENT_CLINIC_OR_DEPARTMENT_OTHER): Payer: Self-pay | Admitting: *Deleted

## 2014-03-21 NOTE — Progress Notes (Signed)
NPO AFTER MN. ARRIVE AT 1015. NEEDS ISTAT AND EKG. WILL TAKE METOPROLOL AM DOS W/ SIPS OF WATER.

## 2014-03-24 ENCOUNTER — Ambulatory Visit (HOSPITAL_BASED_OUTPATIENT_CLINIC_OR_DEPARTMENT_OTHER): Payer: Medicare Other | Admitting: Anesthesiology

## 2014-03-24 ENCOUNTER — Encounter (HOSPITAL_BASED_OUTPATIENT_CLINIC_OR_DEPARTMENT_OTHER)
Admission: RE | Disposition: A | Discharge status: Home / Self Care | Payer: Self-pay | Source: Ambulatory Visit | Attending: Urology

## 2014-03-24 ENCOUNTER — Encounter (HOSPITAL_BASED_OUTPATIENT_CLINIC_OR_DEPARTMENT_OTHER): Payer: Self-pay | Admitting: *Deleted

## 2014-03-24 ENCOUNTER — Other Ambulatory Visit: Payer: Self-pay

## 2014-03-24 ENCOUNTER — Ambulatory Visit (HOSPITAL_BASED_OUTPATIENT_CLINIC_OR_DEPARTMENT_OTHER)
Admission: RE | Admit: 2014-03-24 | Discharge: 2014-03-24 | Disposition: A | Discharge status: Home / Self Care | Payer: Medicare Other | Source: Ambulatory Visit | Attending: Urology | Admitting: Urology

## 2014-03-24 DIAGNOSIS — D494 Neoplasm of unspecified behavior of bladder: Secondary | ICD-10-CM | POA: Diagnosis not present

## 2014-03-24 DIAGNOSIS — Z6830 Body mass index (BMI) 30.0-30.9, adult: Secondary | ICD-10-CM | POA: Insufficient documentation

## 2014-03-24 DIAGNOSIS — E669 Obesity, unspecified: Secondary | ICD-10-CM | POA: Insufficient documentation

## 2014-03-24 DIAGNOSIS — Z87891 Personal history of nicotine dependence: Secondary | ICD-10-CM | POA: Diagnosis not present

## 2014-03-24 DIAGNOSIS — I10 Essential (primary) hypertension: Secondary | ICD-10-CM | POA: Diagnosis not present

## 2014-03-24 DIAGNOSIS — R9431 Abnormal electrocardiogram [ECG] [EKG]: Secondary | ICD-10-CM | POA: Diagnosis not present

## 2014-03-24 DIAGNOSIS — M1990 Unspecified osteoarthritis, unspecified site: Secondary | ICD-10-CM | POA: Insufficient documentation

## 2014-03-24 DIAGNOSIS — K219 Gastro-esophageal reflux disease without esophagitis: Secondary | ICD-10-CM | POA: Insufficient documentation

## 2014-03-24 DIAGNOSIS — Z8249 Family history of ischemic heart disease and other diseases of the circulatory system: Secondary | ICD-10-CM | POA: Diagnosis not present

## 2014-03-24 DIAGNOSIS — E780 Pure hypercholesterolemia: Secondary | ICD-10-CM | POA: Diagnosis not present

## 2014-03-24 DIAGNOSIS — I2510 Atherosclerotic heart disease of native coronary artery without angina pectoris: Secondary | ICD-10-CM | POA: Insufficient documentation

## 2014-03-24 DIAGNOSIS — Z79899 Other long term (current) drug therapy: Secondary | ICD-10-CM | POA: Insufficient documentation

## 2014-03-24 DIAGNOSIS — C679 Malignant neoplasm of bladder, unspecified: Secondary | ICD-10-CM | POA: Insufficient documentation

## 2014-03-24 DIAGNOSIS — Z8551 Personal history of malignant neoplasm of bladder: Secondary | ICD-10-CM

## 2014-03-24 HISTORY — PX: TRANSURETHRAL RESECTION OF BLADDER TUMOR WITH GYRUS (TURBT-GYRUS): SHX6458

## 2014-03-24 HISTORY — DX: Diverticulosis of large intestine without perforation or abscess without bleeding: K57.30

## 2014-03-24 LAB — POCT I-STAT 4, (NA,K, GLUC, HGB,HCT)
Glucose, Bld: 101 mg/dL — ABNORMAL HIGH (ref 70–99)
HCT: 37 % (ref 36.0–46.0)
Hemoglobin: 12.6 g/dL (ref 12.0–15.0)
Potassium: 4.2 mmol/L (ref 3.5–5.1)
Sodium: 142 mmol/L (ref 135–145)

## 2014-03-24 SURGERY — TRANSURETHRAL RESECTION OF BLADDER TUMOR WITH GYRUS (TURBT-GYRUS)
Anesthesia: General

## 2014-03-24 MED ORDER — TRAMADOL HCL 50 MG PO TABS: 50.0000 mg | ORAL_TABLET | Freq: Four times a day (QID) | ORAL | Status: DC | PRN

## 2014-03-24 MED ORDER — PROPOFOL 10 MG/ML IV BOLUS
INTRAVENOUS | Status: DC | PRN
  Administered 2014-03-24: 200 mg via INTRAVENOUS
  Administered 2014-03-24: 50 mg via INTRAVENOUS

## 2014-03-24 MED ORDER — FENTANYL CITRATE 0.05 MG/ML IJ SOLN
INTRAMUSCULAR | Status: AC
  Filled 2014-03-24: qty 4

## 2014-03-24 MED ORDER — ACETAMINOPHEN 10 MG/ML IV SOLN
INTRAVENOUS | Status: DC | PRN
  Administered 2014-03-24: 1000 mg via INTRAVENOUS

## 2014-03-24 MED ORDER — BELLADONNA ALKALOIDS-OPIUM 16.2-60 MG RE SUPP
RECTAL | Status: DC | PRN
  Administered 2014-03-24: 1 via RECTAL

## 2014-03-24 MED ORDER — HYOSCYAMINE SULFATE 0.125 MG SL SUBL
0.1250 mg | SUBLINGUAL_TABLET | Freq: Once | SUBLINGUAL | Status: AC
  Administered 2014-03-24: 0.125 mg via SUBLINGUAL
  Filled 2014-03-24: qty 1

## 2014-03-24 MED ORDER — HYOSCYAMINE SULFATE 0.125 MG SL SUBL
SUBLINGUAL_TABLET | SUBLINGUAL | Status: AC
  Filled 2014-03-24: qty 1

## 2014-03-24 MED ORDER — HYOSCYAMINE SULFATE 0.125 MG SL SUBL
0.2500 mg | SUBLINGUAL_TABLET | Freq: Once | SUBLINGUAL | Status: DC
  Filled 2014-03-24: qty 2

## 2014-03-24 MED ORDER — DEXAMETHASONE SODIUM PHOSPHATE 4 MG/ML IJ SOLN
INTRAMUSCULAR | Status: DC | PRN
  Administered 2014-03-24: 4 mg via INTRAVENOUS

## 2014-03-24 MED ORDER — PHENAZOPYRIDINE HCL 200 MG PO TABS: 200.0000 mg | ORAL_TABLET | Freq: Three times a day (TID) | ORAL | Status: DC | PRN

## 2014-03-24 MED ORDER — EPHEDRINE SULFATE 50 MG/ML IJ SOLN
INTRAMUSCULAR | Status: DC | PRN
  Administered 2014-03-24: 10 mg via INTRAVENOUS

## 2014-03-24 MED ORDER — LIDOCAINE HCL (CARDIAC) 20 MG/ML IV SOLN
INTRAVENOUS | Status: DC | PRN
  Administered 2014-03-24: 60 mg via INTRAVENOUS

## 2014-03-24 MED ORDER — LACTATED RINGERS IV SOLN
INTRAVENOUS | Status: DC
  Administered 2014-03-24 (×2): via INTRAVENOUS
  Filled 2014-03-24: qty 1000

## 2014-03-24 MED ORDER — CIPROFLOXACIN IN D5W 200 MG/100ML IV SOLN
INTRAVENOUS | Status: AC
  Filled 2014-03-24: qty 100

## 2014-03-24 MED ORDER — TOLTERODINE TARTRATE ER 4 MG PO CP24: 4.0000 mg | ORAL_CAPSULE | Freq: Every day | ORAL | Status: DC

## 2014-03-24 MED ORDER — SODIUM CHLORIDE 0.9 % IR SOLN
Status: DC | PRN
  Administered 2014-03-24: 9000 mL via INTRAVESICAL

## 2014-03-24 MED ORDER — BELLADONNA ALKALOIDS-OPIUM 16.2-60 MG RE SUPP
RECTAL | Status: AC
  Filled 2014-03-24: qty 1

## 2014-03-24 MED ORDER — MITOMYCIN CHEMO FOR BLADDER INSTILLATION 40 MG
40.0000 mg | Freq: Once | INTRAVENOUS | Status: AC
  Administered 2014-03-24: 40 mg via INTRAVESICAL
  Filled 2014-03-24: qty 40

## 2014-03-24 MED ORDER — FENTANYL CITRATE 0.05 MG/ML IJ SOLN
INTRAMUSCULAR | Status: DC | PRN
  Administered 2014-03-24 (×4): 25 ug via INTRAVENOUS

## 2014-03-24 MED ORDER — ONDANSETRON HCL 4 MG/2ML IJ SOLN
INTRAMUSCULAR | Status: DC | PRN
  Administered 2014-03-24: 4 mg via INTRAVENOUS

## 2014-03-24 MED ORDER — FENTANYL CITRATE 0.05 MG/ML IJ SOLN
25.0000 ug | INTRAMUSCULAR | Status: DC | PRN
Start: 2014-03-24 — End: 2014-03-24
  Filled 2014-03-24: qty 1

## 2014-03-24 MED ORDER — CIPROFLOXACIN IN D5W 200 MG/100ML IV SOLN
200.0000 mg | INTRAVENOUS | Status: AC
  Administered 2014-03-24: 200 mg via INTRAVENOUS
  Filled 2014-03-24: qty 100

## 2014-03-24 SURGICAL SUPPLY — 42 items
BAG DRAIN URO-CYSTO SKYTR STRL (DRAIN) IMPLANT
BAG DRN ANRFLXCHMBR STRAP LEK (BAG)
BAG DRN UROCATH (DRAIN)
BAG URINE DRAINAGE (UROLOGICAL SUPPLIES) IMPLANT
BAG URINE LEG 19OZ MD ST LTX (BAG) IMPLANT
CANISTER SUCT LVC 12 LTR MEDI- (MISCELLANEOUS) IMPLANT
CATH FOLEY 2WAY SLVR  5CC 12FR (CATHETERS)
CATH FOLEY 2WAY SLVR  5CC 22FR (CATHETERS) ×1
CATH FOLEY 2WAY SLVR 5CC 12FR (CATHETERS) IMPLANT
CATH FOLEY 2WAY SLVR 5CC 22FR (CATHETERS) ×1 IMPLANT
CATH FOLEY 3WAY 30CC 24FR (CATHETERS) ×2
CATH HEMA 3WAY 30CC 24FR COUDE (CATHETERS) IMPLANT
CATH HEMA 3WAY 30CC 24FR RND (CATHETERS) IMPLANT
CATH URTH STD 24FR FL 3W 2 (CATHETERS) ×1 IMPLANT
CLOTH BEACON ORANGE TIMEOUT ST (SAFETY) ×2 IMPLANT
DRAPE CAMERA CLOSED 9X96 (DRAPES) ×2 IMPLANT
ELECT BUTTON BIOP 24F 90D PLAS (MISCELLANEOUS) IMPLANT
ELECT BUTTON HF 24-28F 2 30DE (ELECTRODE) IMPLANT
ELECT LOOP MED HF 24F 12D CBL (CLIP) IMPLANT
ELECT REM PT RETURN 9FT ADLT (ELECTROSURGICAL)
ELECT RESECT VAPORIZE 12D CBL (ELECTRODE) ×2 IMPLANT
ELECTRODE REM PT RTRN 9FT ADLT (ELECTROSURGICAL) IMPLANT
EVACUATOR MICROVAS BLADDER (UROLOGICAL SUPPLIES) ×2 IMPLANT
GLOVE BIO SURGEON STRL SZ8 (GLOVE) ×2 IMPLANT
GLOVE BIOGEL M 8.0 STRL (GLOVE) ×2 IMPLANT
GLOVE BIOGEL PI IND STRL 7.5 (GLOVE) ×6 IMPLANT
GLOVE BIOGEL PI INDICATOR 7.5 (GLOVE) ×6
GLOVE SS BIOGEL STRL SZ 7.5 (GLOVE) ×1 IMPLANT
GLOVE SS BIOGEL STRL SZ 8 (GLOVE) ×1 IMPLANT
GLOVE SUPERSENSE BIOGEL SZ 7.5 (GLOVE) ×1
GLOVE SUPERSENSE BIOGEL SZ 8 (GLOVE) ×1
GOWN PREVENTION PLUS LG XLONG (DISPOSABLE) ×6 IMPLANT
GOWN STRL REIN XL XLG (GOWN DISPOSABLE) ×2 IMPLANT
HOLDER FOLEY CATH W/STRAP (MISCELLANEOUS) IMPLANT
IV NS IRRIG 3000ML ARTHROMATIC (IV SOLUTION) ×6 IMPLANT
PACK CYSTO (CUSTOM PROCEDURE TRAY) ×2 IMPLANT
PLUG CATH AND CAP STER (CATHETERS) ×2 IMPLANT
SET ASPIRATION TUBING (TUBING) ×2 IMPLANT
SYR 30ML LL (SYRINGE) IMPLANT
SYRINGE IRR TOOMEY STRL 70CC (SYRINGE) IMPLANT
WATER STERILE IRR 3000ML UROMA (IV SOLUTION) IMPLANT
WATER STERILE IRR 500ML POUR (IV SOLUTION) ×2 IMPLANT

## 2014-03-24 NOTE — H&P (Signed)
Gabriela Smith is a 74 year old female who returns for routine surveillance cystoscopy.   History of Present Illness Transitional cell carcinoma of the bladder: Workup of gross hematuria in 11/12 revealed normal upper tracts by CT scan and a 7 cm tumor in the bladder involving the right wall. She underwent TURBT on 03/14/11 and at that time her tumor was fully resected. It was noted to be superior and lateral to the right ureteral orifice.  Pathology: Transitional cell carcinoma Ta,G3.   She had a recurrent tumor identified on repeat resection on 05/09/11.   Pathology: Ta,G3  Treatment: 6 week induction course of BCG completed in 5/13  1st recurrence ->TURBT 03/08/12 - Ta,G3 -> post-op mitomycin-C.  She had a minute recurrence treated with office fulguration in 2/15.    Interval history: She has no new urologic complaints noted today. Specifically she denies any dysuria or hematuria. She also has no new irritative voiding symptoms.    Past Medical History Problems  1. History of Arthritis 2. History of cardiac disorder (Z86.79) 3. History of esophageal reflux (Z87.19) 4. History of hypercholesterolemia (Z86.39) 5. History of hypertension (Z86.79)  Surgical History Problems  1. History of Arthroscopy Knee Left 2. History of Bladder Injection Of Cancer Treatment 3. History of Cholecystectomy 4. History of Cystoscopy With Fulguration Large Lesion (Over 5cm) 5. History of Cystoscopy With Fulguration Small Lesion (5-61mm) 6. History of Cystoscopy With Fulguration Small Lesion (5-72mm) 7. History of Previous Balloon Angioplasty 8. History of Umbilical Hernia Repair  Current Meds 1. Co Q 10 CAPS;  Therapy: (Recorded:27Aug2013) to Recorded 2. Flaxseed Oil CAPS;  Therapy: (Recorded:12May2015) to Recorded 3. Hydrochlorothiazide 12.5 MG Oral Capsule;  Therapy: [id number] to Recorded 4. Lipitor TABS;  Therapy: (Recorded:24Nov2015) to Recorded 5. Metoprolol Tartrate 25 MG Oral Tablet;  Therapy: 26Jun2012 to Recorded  Allergies Medication  1. Codeine Derivatives 2. Nitrofurantoin CAPS  Family History Problems  1. Family history of Death In The Family Father 2. Family history of Death In The Family Mother 3. Family history of Family Health Status Number Of Children   2 SONS 1 DAUGHTER 4. Family history of Heart Disease 5. Family history of Hypertension 6. No pertinent family history : Mother  Social History Problems  1. Alcohol Use 2. Denied: History of Caffeine Use 3. Marital History - Currently Married 4. Never A Smoker 5. Retired From Work  Review of [employer], constitutional, skin, eye, otolaryngeal, hematologic/lymphatic, cardiovascular, pulmonary, endocrine, musculoskeletal, gastrointestinal, neurological and psychiatric system(s) were reviewed and pertinent findings if present are noted.  Genitourinary: urinary frequency, urinary urgency, dysuria, nocturia and urinary stream starts and stops.  Gastrointestinal: heartburn.  Constitutional: feeling tired (fatigue).  Integumentary: skin rash/lesion and pruritus.  Musculoskeletal: back pain and joint pain.    Vitals Vital Signs BMI Calculated: 28.17 BSA Calculated: 1.81 Height: 5 ft 4 in Weight: 165 lb  Blood Pressure: 160 / 79 Temperature: 98.4 F Heart Rate: 72  Physical Exam Constitutional: Well nourished and well developed . No acute distress.  ENT:. The ears and nose are normal in appearance.  Neck: The appearance of the neck is normal and no neck mass is present.  Pulmonary: No respiratory distress and normal respiratory rhythm and effort.  Cardiovascular: Heart rate and rhythm are normal . No peripheral edema.  Abdomen: The abdomen is soft and nontender. No masses are palpated. No CVA tenderness. No hernias are palpable. No hepatosplenomegaly noted.  Genitourinary: Examination of the external genitalia shows normal female external genitalia and no lesions. The urethra  is normal  in appearance and not tender. There is no urethral mass. Vaginal exam demonstrates no abnormalities. The adnexa are palpably normal. The bladder is non tender and not distended. The anus is normal on inspection. The perineum is normal on inspection.  Lymphatics: The femoral and inguinal nodes are not enlarged or tender.  Skin: Normal skin turgor, no visible rash and no visible skin lesions.  Neuro/Psych:. Mood and affect are appropriate.    Procedure: Cystoscopy done 01/31/14 Chaperone Present: Sonia.  Indication: History of Urothelial Carcinoma.  Informed Consent: Risks, benefits, and potential adverse events were discussed and informed consent was obtained from the patient.  Prep: The patient was prepped with hibiclens.  Procedure Note:  Urethral meatus:. No abnormalities.  Anterior urethra: No abnormalities.  Bladder: The left orifice appears to be somewhat distorted but patent. Visulization was clear. The ureteral orifices had clear efflux of urine. The right ureteral orifice was in the normal anatomic position. Multiple tumors were identified in the bladder. (she had multiple papillary tumors noted near the dome as well as what appeared to be some papillary abnormalities of the mucosa on the floor of the bladder near the trigone) There was a scar noted on the posterior wall of the bladder. material. The patient tolerated the procedure well.  Complications: None.    Assessment     She had multiple areas of recurrence located primarily at the dome but there also were some areas on the floor of the bladder. We discussed the need to remove these in the operating room due to the multiplicity of her lesions. I also have discussed placement of mitomycin C postoperatively with her again today.   She has undergone a previous induction course of BCG which was successful for some time. I have discussed with her today a repeat induction course once she comes back to the office and then I will maintain  her on maintenance BCG and we discussed that in detail today.   Plan  1. She will be scheduled for transurethral resection/biopsy of her multiple bladder tumors.  2. Planned postoperative mitomycin C instillation.

## 2014-03-24 NOTE — Anesthesia Postprocedure Evaluation (Signed)
  Anesthesia Post-op Note  Patient: Gabriela Smith  Procedure(s) Performed: Procedure(s) (LRB): TRANSURETHRAL RESECTION OF BLADDER TUMOR WITH GYRUS (TURBT-GYRUS) (N/A)  Patient Location: PACU  Anesthesia Type: general  Level of Consciousness: awake and alert   Airway and Oxygen Therapy: Patient Spontanous Breathing  Post-op Pain: mild  Post-op Assessment: Post-op Vital signs reviewed, Patient's Cardiovascular Status Stable, Respiratory Function Stable, Patent Airway and No signs of Nausea or vomiting  Last Vitals:  Filed Vitals:   03/24/14 1545  BP: 138/58  Pulse: 104  Temp: 36.7 C  Resp: 18    Post-op Vital Signs: stable   Complications: No apparent anesthesia complications

## 2014-03-24 NOTE — Anesthesia Preprocedure Evaluation (Signed)
Anesthesia Evaluation  Patient identified by MRN, date of birth, ID band Patient awake    Reviewed: Allergy & Precautions, NPO status , Patient's Chart, lab work & pertinent test results  Airway Mallampati: II  TM Distance: >3 FB Neck ROM: Full    Dental no notable dental hx.    Pulmonary former smoker,  breath sounds clear to auscultation  Pulmonary exam normal       Cardiovascular Exercise Tolerance: Good hypertension, Pt. on medications and Pt. on home beta blockers + CAD Rhythm:Regular Rate:Normal     Neuro/Psych negative neurological ROS  negative psych ROS   GI/Hepatic Neg liver ROS, GERD-  Medicated,  Endo/Other  negative endocrine ROS  Renal/GU negative Renal ROS  negative genitourinary   Musculoskeletal  (+) Arthritis -,   Abdominal (+) + obese,   Peds negative pediatric ROS (+)  Hematology negative hematology ROS (+)   Anesthesia Other Findings   Reproductive/Obstetrics negative OB ROS                             Anesthesia Physical Anesthesia Plan  ASA: III  Anesthesia Plan: General   Post-op Pain Management:    Induction: Intravenous  Airway Management Planned: LMA  Additional Equipment:   Intra-op Plan:   Post-operative Plan: Extubation in OR  Informed Consent: I have reviewed the patients History and Physical, chart, labs and discussed the procedure including the risks, benefits and alternatives for the proposed anesthesia with the patient or authorized representative who has indicated his/her understanding and acceptance.   Dental advisory given  Plan Discussed with: CRNA  Anesthesia Plan Comments:         Anesthesia Quick Evaluation

## 2014-03-24 NOTE — Anesthesia Procedure Notes (Signed)
Procedure Name: LMA Insertion Date/Time: 03/24/2014 12:20 PM Performed by: Justice Rocher Pre-anesthesia Checklist: Patient identified, Emergency Drugs available, Suction available and Patient being monitored Patient Re-evaluated:Patient Re-evaluated prior to inductionOxygen Delivery Method: Circle System Utilized Preoxygenation: Pre-oxygenation with 100% oxygen Intubation Type: IV induction Ventilation: Mask ventilation without difficulty LMA: LMA inserted LMA Size: 4.0 Number of attempts: 1 Airway Equipment and Method: Bite block Placement Confirmation: positive ETCO2 Tube secured with: Tape Dental Injury: Teeth and Oropharynx as per pre-operative assessment

## 2014-03-24 NOTE — Transfer of Care (Signed)
Immediate Anesthesia Transfer of Care Note  Patient: Gabriela Smith  Procedure(s) Performed: Procedure(s) (LRB): TRANSURETHRAL RESECTION OF BLADDER TUMOR WITH GYRUS (TURBT-GYRUS) (N/A)  Patient Location: PACU  Anesthesia Type: General  Level of Consciousness: awake, sedated, patient cooperative and responds to stimulation  Airway & Oxygen Therapy: Patient Spontanous Breathing and Patient connected to face mask oxygen  Post-op Assessment: Report given to PACU RN, Post -op Vital signs reviewed and stable and Patient moving all extremities  Post vital signs: Reviewed and stable  Complications: No apparent anesthesia complications

## 2014-03-24 NOTE — Op Note (Signed)
PATIENT:  [name]  PRE-OPERATIVE DIAGNOSIS: Recurrent transitional cell carcinoma of the bladder  POST-OPERATIVE DIAGNOSIS: Same  PROCEDURE: 1. TURBT 2.6 cm 2. Intravesical instillation of chemotherapy  SURGEON:  Claybon Jabs  INDICATION: OVELLA MANYGOATS is a 74 year old female who was initially diagnosed with bladder cancer in 4/13. It was Ta,G3 and she had a recurrence 2 months later and underwent a six-week induction course of BCG which was completed in 5/13. She then had a recurrence in 12/13 that was Ta,G3 and received postoperative mitomycin-C. She did well with no further recurrences until 2/15 when a small papillary recurrence was fulgurated in the office. She recently was found to have several recurrent bladder tumors and is brought to the operating room today for resection/fulguration of these tumors.  ANESTHESIA:  General  EBL:  Minimal  DRAINS:20 French Foley catheter   LOCAL MEDICATIONS USED:  None  SPECIMEN:  Portion of bladder tumor to pathology  Description of procedure: After informed consent the patient was taken to the operating room and placed on the table in a supine position. General anesthesia was then administered. Once fully anesthetized the patient was moved to the dorsal lithotomy position and the genitalia were sterilely prepped and draped in standard fashion. An official timeout was then performed.  The 19 French resectoscope with visual obturator and 12 lens was then passed into the bladder under direct vision. I did a full and systematic inspection of the bladder and found multiple papillary tumors involving multiple locations. These included several at the dome, several on the right wall near the bladder neck as well as several small areas on the floor of the bladder. The right ureteral orifice was patulous and distorted secondary to previous resection nearby in the left orifice appeared normal.  I inserted the resectoscope element and resected  portions of the tumors however as I resected each of these tumors I paid particular attention to remain very superficial and in doing so this resulted in near obliteration of the delicate papillary lesions. I was able to resect away one of the lesions and a portion of this was retrieved and sent to pathology. There were several areas that appeared to be 1-2 mm in size and papillary and these were treated with fulguration alone. There was also some erythematous areas and these were fulgurated completely. At the completion I reinspected the entire bladder and there were no further papillary tumors or worrisome erythematous lesions within the bladder. There was no perforation. I therefore drained the bladder, removed the resectoscope and the patient was awakened and taken to the recovery room in stable and satisfactory condition. She tolerated procedure well with no intraoperative complications.  While in the recovery room with her 30 French Foley catheter in place I instilled 40 mg of mitomycin-C dissolved in 40 mL of water. This was left in the bladder for 1 hour and the patient had experienced bladder spasms the last time she was treated with intravesical mitomycin C and therefore she was given a single dose of Levsin SL sublingually in the recovery room and prior to leaving the operating room she also received a B&O suppository. After an hour of 12 time mitomycin-C solution was then drained from her bladder.  PLAN OF CARE: Discharge to home after PACU  PATIENT DISPOSITION:  PACU - hemodynamically stable.

## 2014-03-24 NOTE — Discharge Instructions (Signed)
Transurethral Resection of Bladder Tumor (TURBT)   Definition:  Transurethral Resection of the Bladder Tumor is a surgical procedure used to diagnose and remove tumors within the bladder. TURBT is the most common treatment for early stage bladder cancer.  General instructions:     Your recent bladder surgery requires very little post hospital care but some definite precautions.  Despite the fact that no skin incisions were used, the area around the bladder incisions are raw and covered with scabs to promote healing and prevent bleeding. Certain precautions are needed to insure that the scabs are not disturbed over the next 2-4 weeks while the healing proceeds.  Because the raw surface inside your bladder and the irritating effects of urine you may expect frequency of urination and/or urgency (a stronger desire to urinate) and perhaps even getting up at night more often. This will usually resolve or improve slowly over the healing period. You may see some blood in your urine over the first 6 weeks. Do not be alarmed, even if the urine was clear for a while. Get off your feet and drink lots of fluids until clearing occurs. If you start to pass clots or don't improve call us.  Catheter: (If you are discharged with a catheter.)  1. Keep your catheter secured to your leg at all times with tape or the supplied strap. 2. You may experience leakage of urine around your catheter- as long as the  catheter continues to drain, this is normal.  If your catheter stops draining  go to the ER. 3. You may also have blood in your urine, even after it has been clear for  several days; you may even pass some small blood clots or other material.  This  is normal as well.  If this happens, sit down and drink plenty of water to help  make urine to flush out your bladder.  If the blood in your urine becomes worse  after doing this, contact our office or return to the ER. 4. You may use the leg bag (small bag)  during the day, but use the large bag at  night.  Diet:  You may return to your normal diet immediately. Because of the raw surface of your bladder, alcohol, spicy foods, foods high in acid and drinks with caffeine may cause irritation or frequency and should be used in moderation. To keep your urine flowing freely and avoid constipation, drink plenty of fluids during the day (8-10 glasses). Tip: Avoid cranberry juice because it is very acidic.  Activity:  Your physical activity doesn't need to be restricted. However, if you are very active, you may see some blood in the urine. We suggest that you reduce your activity under the circumstances until the bleeding has stopped.  Bowels:  It is important to keep your bowels regular during the postoperative period. Straining with bowel movements can cause bleeding. A bowel movement every other day is reasonable. Use a mild laxative if needed, such as milk of magnesia 2-3 tablespoons, or 2 Dulcolax tablets. Call if you continue to have problems. If you had been taking narcotics for pain, before, during or after your surgery, you may be constipated. Take a laxative if necessary.    Medication:  You should resume your pre-surgery medications unless told not to. In addition you may be given an antibiotic to prevent or treat infection. Antibiotics are not always necessary. All medication should be taken as prescribed until the bottles are finished unless you are having  an unusual reaction to one of the drugs.    Post Anesthesia Home Care Instructions  Activity: Get plenty of rest for the remainder of the day. A responsible adult should stay with you for 24 hours following the procedure.  For the next 24 hours, DO NOT: -Drive a car -Operate machinery -Drink alcoholic beverages -Take any medication unless instructed by your physician -Make any legal decisions or sign important papers.  Meals: Start with liquid foods such as gelatin or soup.  Progress to regular foods as tolerated. Avoid greasy, spicy, heavy foods. If nausea and/or vomiting occur, drink only clear liquids until the nausea and/or vomiting subsides. Call your physician if vomiting continues.  Special Instructions/Symptoms: Your throat may feel dry or sore from the anesthesia or the breathing tube placed in your throat during surgery. If this causes discomfort, gargle with warm salt water. The discomfort should disappear within 24 hours.        

## 2014-03-28 ENCOUNTER — Encounter (HOSPITAL_BASED_OUTPATIENT_CLINIC_OR_DEPARTMENT_OTHER): Payer: Self-pay | Admitting: Urology

## 2014-03-29 DIAGNOSIS — C679 Malignant neoplasm of bladder, unspecified: Secondary | ICD-10-CM | POA: Diagnosis not present

## 2014-03-29 DIAGNOSIS — N3281 Overactive bladder: Secondary | ICD-10-CM | POA: Diagnosis not present

## 2014-04-05 ENCOUNTER — Encounter (HOSPITAL_BASED_OUTPATIENT_CLINIC_OR_DEPARTMENT_OTHER): Payer: Self-pay | Admitting: Urology

## 2014-04-06 ENCOUNTER — Other Ambulatory Visit: Payer: Self-pay

## 2014-04-06 DIAGNOSIS — Z1231 Encounter for screening mammogram for malignant neoplasm of breast: Secondary | ICD-10-CM

## 2014-05-02 ENCOUNTER — Other Ambulatory Visit: Payer: Self-pay | Admitting: Family Medicine

## 2014-05-09 DIAGNOSIS — C679 Malignant neoplasm of bladder, unspecified: Secondary | ICD-10-CM | POA: Diagnosis not present

## 2014-05-09 DIAGNOSIS — Z5111 Encounter for antineoplastic chemotherapy: Secondary | ICD-10-CM | POA: Diagnosis not present

## 2014-05-17 DIAGNOSIS — C679 Malignant neoplasm of bladder, unspecified: Secondary | ICD-10-CM | POA: Diagnosis not present

## 2014-05-17 DIAGNOSIS — Z5111 Encounter for antineoplastic chemotherapy: Secondary | ICD-10-CM | POA: Diagnosis not present

## 2014-05-22 ENCOUNTER — Ambulatory Visit: Payer: Medicare Other

## 2014-05-24 DIAGNOSIS — C679 Malignant neoplasm of bladder, unspecified: Secondary | ICD-10-CM | POA: Diagnosis not present

## 2014-05-24 DIAGNOSIS — Z5111 Encounter for antineoplastic chemotherapy: Secondary | ICD-10-CM | POA: Diagnosis not present

## 2014-05-30 ENCOUNTER — Encounter: Payer: Self-pay | Admitting: Family Medicine

## 2014-05-30 ENCOUNTER — Ambulatory Visit (INDEPENDENT_AMBULATORY_CARE_PROVIDER_SITE_OTHER): Payer: Medicare Other | Admitting: Family Medicine

## 2014-05-30 VITALS — BP 136/71 | HR 72 | Temp 97.6°F | Ht 63.0 in | Wt 179.0 lb

## 2014-05-30 DIAGNOSIS — E559 Vitamin D deficiency, unspecified: Secondary | ICD-10-CM

## 2014-05-30 DIAGNOSIS — Z780 Asymptomatic menopausal state: Secondary | ICD-10-CM

## 2014-05-30 DIAGNOSIS — Z789 Other specified health status: Secondary | ICD-10-CM

## 2014-05-30 DIAGNOSIS — Z8551 Personal history of malignant neoplasm of bladder: Secondary | ICD-10-CM

## 2014-05-30 DIAGNOSIS — E785 Hyperlipidemia, unspecified: Secondary | ICD-10-CM

## 2014-05-30 DIAGNOSIS — I2510 Atherosclerotic heart disease of native coronary artery without angina pectoris: Secondary | ICD-10-CM

## 2014-05-30 DIAGNOSIS — I10 Essential (primary) hypertension: Secondary | ICD-10-CM | POA: Diagnosis not present

## 2014-05-30 DIAGNOSIS — E7849 Other hyperlipidemia: Secondary | ICD-10-CM

## 2014-05-30 DIAGNOSIS — Z889 Allergy status to unspecified drugs, medicaments and biological substances status: Secondary | ICD-10-CM

## 2014-05-30 LAB — POCT CBC
Granulocyte percent: 62.5 %G (ref 37–80)
HCT, POC: 39.3 % (ref 37.7–47.9)
Hemoglobin: 12.2 g/dL (ref 12.2–16.2)
Lymph, poc: 1.8 (ref 0.6–3.4)
MCH, POC: 28.9 pg (ref 27–31.2)
MCHC: 31 g/dL — AB (ref 31.8–35.4)
MCV: 93.1 fL (ref 80–97)
MPV: 7.7 fL (ref 0–99.8)
POC Granulocyte: 3.6 (ref 2–6.9)
POC LYMPH PERCENT: 31.3 %L (ref 10–50)
Platelet Count, POC: 229 10*3/uL (ref 142–424)
RBC: 4.22 M/uL (ref 4.04–5.48)
RDW, POC: 13.6 %
WBC: 5.8 10*3/uL (ref 4.6–10.2)

## 2014-05-30 NOTE — Patient Instructions (Addendum)
Medicare Annual Wellness Visit  [facility] and the medical providers at [facility] strive to bring you the best medical care.  In doing so we not only want to address your current medical conditions and concerns but also to detect new conditions early and prevent illness, disease and health-related problems.    Medicare offers a yearly Wellness Visit which allows our clinical staff to assess your need for preventative services including immunizations, lifestyle education, counseling to decrease risk of preventable diseases and screening for fall risk and other medical concerns.    This visit is provided free of charge (no copay) for all Medicare recipients. The clinical pharmacists at [facility] have begun to conduct these Wellness Visits which will also include a thorough review of all your medications.    As you primary medical provider recommend that you make an appointment for your Annual Wellness Visit if you have not done so already this year.  You may set up this appointment before you leave today or you may call back ([phone]) and schedule an appointment.  Please make sure when you call that you mention that you are scheduling your Annual Wellness Visit with the clinical pharmacist so that the appointment may be made for the proper length of time.     Continue current medications. Continue good therapeutic lifestyle changes which include good diet and exercise. Fall precautions discussed with patient. If an FOBT was given today- please return it to our front desk. If you are over 24 years old - you may need Prevnar 20 or the adult Pneumonia vaccine.  Flu Shots are still available at our office. If you still haven't had one please call to set up a nurse visit to get one.   After your visit with [place] today you will receive a survey in the mail or online from [employer] regarding your care with [place]. Please take a moment to  fill this out. Your feedback is very important to [place] as you can help [place] better understand your patient needs as well as improve your experience and satisfaction. WE CARE ABOUT YOU!!!   We will arrange for you to have a visit with the cardiologist for follow-up of your history of coronary artery disease Continue follow-up with urology When your bladder treatments are completed, tried to get out and get more exercise Continue to watch your diet and take your Lipitor as you are doing

## 2014-05-30 NOTE — Progress Notes (Signed)
Subjective:    Patient ID: Gabriela Smith, female    DOB: 09-12-1940, 74 y.o.   MRN: [id number]  HPI Pt here for follow up and management of chronic medical problems which includes bladder cancer, hypertension, and hyperlipidemia. She is taking medications regularly. The patient is on the fourth of 6 treatments for her bladder cancer. She feels extremely tired after doing these treatments for several days and she gets to treatments every week on Wednesday. She has a history of angioplasty several years ago and has not seen the cardiologist recently. She is not having any chest pain. She denies any shortness of breath or problems with her GI system. She does go more frequently passing her water with these bladder treatments that she is now getting. She is also concerned about a lump on her abdominal wall and she will discuss this further also with the urologist. She has not been getting as much exercise recently because of the bladder treatments. It is important to note that her daughter also has heart disease.        Patient Active Problem List   Diagnosis Date Noted  . Statin intolerance 05/23/2013  . Osteopenia 04/20/2013  . Degenerative disc disease, lumbar 02/08/2013  . Vitamin D deficiency 02/08/2013  . Overweight (BMI 25.0-29.9) 11/24/2012  . S/P left TKA 11/22/2012  . History of bladder cancer 11/04/2012  . Osteoarthritis of left knee 11/04/2012  . Hyperlipidemia 11/28/2008  . Hypertension 11/28/2008  . CORONARY ATHEROSCLEROSIS NATIVE CORONARY ARTERY 11/28/2008   Outpatient Encounter Prescriptions as of 05/30/2014  Medication Sig  . atorvastatin (LIPITOR) 10 MG tablet Take 10 mg by mouth every evening.  . calcium carbonate (TUMS - DOSED IN MG ELEMENTAL CALCIUM) 500 MG chewable tablet Chew 1 tablet by mouth as needed for indigestion or heartburn.  . Cholecalciferol (VITAMIN D3) 2000 UNITS TABS Take 1 capsule by mouth daily.  . Coenzyme Q10 (COQ-10) 100 MG CAPS Take 1 capsule by  mouth daily.  . Flaxseed, Linseed, (FLAX SEED OIL) 1000 MG CAPS Take 1 capsule by mouth daily.   . hydrochlorothiazide (HYDRODIURIL) 25 MG tablet Take 1 tablet (25 mg total) by mouth daily. (Patient taking differently: Take 25 mg by mouth every morning. )  . metoprolol tartrate (LOPRESSOR) 25 MG tablet TAKE (1) TABLET TWICE A DAY.  . traMADol (ULTRAM) 50 MG tablet Take 1 tablet (50 mg total) by mouth every 6 (six) hours as needed.  . [DISCONTINUED] atorvastatin (LIPITOR) 10 MG tablet Take 1 tablet (10 mg total) by mouth daily. As directed  . [DISCONTINUED] phenazopyridine (PYRIDIUM) 200 MG tablet Take 1 tablet (200 mg total) by mouth 3 (three) times daily as needed for pain.  . [DISCONTINUED] tolterodine (DETROL LA) 4 MG 24 hr capsule Take 1 capsule (4 mg total) by mouth daily.    Review of Systems  Constitutional: Negative.   HENT: Negative.   Eyes: Negative.   Respiratory: Negative.   Cardiovascular: Negative.   Gastrointestinal: Negative.   Endocrine: Negative.   Genitourinary: Negative.   Musculoskeletal: Negative.   Skin: Negative.   Allergic/Immunologic: Negative.   Neurological: Negative.   Hematological: Negative.   Psychiatric/Behavioral: Negative.        Objective:   Physical Exam  Constitutional: She is oriented to person, place, and time. She appears well-developed and well-nourished. No distress.  HENT:  Head: Normocephalic and atraumatic.  Right Ear: External ear normal.  Left Ear: External ear normal.  Nose: Nose normal.  Mouth/Throat: Oropharynx is clear and  moist.  Eyes: Conjunctivae and EOM are normal. Pupils are equal, round, and reactive to light. Right eye exhibits no discharge. Left eye exhibits no discharge. No scleral icterus.  Neck: Normal range of motion. Neck supple. No thyromegaly present.  No carotid or supraclavicular bruits. No anterior cervical adenopathy.  Cardiovascular: Normal rate, regular rhythm, normal heart sounds and intact distal  pulses.  Exam reveals no gallop and no friction rub.   No murmur heard. The heart has a regular rate and rhythm at 72/m  Pulmonary/Chest: Effort normal and breath sounds normal. No respiratory distress. She has no wheezes. She has no rales. She exhibits no tenderness.  Lungs are clear anteriorly and posteriorly  Abdominal: Soft. Bowel sounds are normal. She exhibits no mass. There is no tenderness. There is no rebound and no guarding.  The abdomen is mildly obese and the palpable lump is felt just below the waistline and to the right of the umbilicus. There were no inguinal nodes palpable.  Musculoskeletal: Normal range of motion. She exhibits no edema or tenderness.  Lymphadenopathy:    She has no cervical adenopathy.  Neurological: She is alert and oriented to person, place, and time. She has normal reflexes. No cranial nerve deficit.  Skin: Skin is warm and dry. No rash noted. No erythema. No pallor.  There is a subcutaneous lump on the abdomen just below and to the right of the umbilicus  Psychiatric: She has a normal mood and affect. Her behavior is normal. Judgment and thought content normal.  Patient's mood is positive even though she also takes care of a husband who has Parkinson's.  Nursing note and vitals reviewed.  BP 136/71 mmHg  Pulse 72  Temp(Src) 97.6 F (36.4 C) (Oral)  Ht '5\' 3"'  (1.6 m)  Wt 179 lb (81.194 kg)  BMI 31.72 kg/m2        Assessment & Plan:   1. Hyperlipidemia, familial, high LDL -Continue with Lipitor and when possible resume her walking and increase physical activity - POCT CBC - NMR, lipoprofile  2. Hypertension -The blood pressure is under good control today the patient should continue with her current treatment - POCT CBC - BMP8+EGFR - Hepatic function panel  3. Vitamin D deficiency -For now, continue with current treatment and any dose change will be adjusted following the results of the lab work being done today - POCT CBC - Vit D  25  hydroxy (rtn osteoporosis monitoring)  4. Bladder cancer -Continue follow-up with urology - POCT CBC  5. Statin intolerance -The patient is tolerating her Lipitor currently and we will consider the possibility of using a PCS K-9 inhibitor in the future if we cannot achieve the goals we would like to achieve with the oral statin - POCT CBC - NMR, lipoprofile  6. Postmenopausal -The patient is having no problems with this at this time. - POCT CBC  7. ASCVD (arteriosclerotic cardiovascular disease) -Because of her past history of angioplasty we will arrange for her to see the cardiologist as she has not seen him in several years.  No orders of the defined types were placed in this encounter.   Patient Instructions                       Medicare Annual Wellness Visit  [facility] and the medical providers at [facility] strive to bring you the best medical care.  In doing so we not only want to address your current medical  conditions and concerns but also to detect new conditions early and prevent illness, disease and health-related problems.    Medicare offers a yearly Wellness Visit which allows our clinical staff to assess your need for preventative services including immunizations, lifestyle education, counseling to decrease risk of preventable diseases and screening for fall risk and other medical concerns.    This visit is provided free of charge (no copay) for all Medicare recipients. The clinical pharmacists at [facility] have begun to conduct these Wellness Visits which will also include a thorough review of all your medications.    As you primary medical provider recommend that you make an appointment for your Annual Wellness Visit if you have not done so already this year.  You may set up this appointment before you leave today or you may call back ([phone]) and schedule an appointment.  Please make sure when you call that you  mention that you are scheduling your Annual Wellness Visit with the clinical pharmacist so that the appointment may be made for the proper length of time.     Continue current medications. Continue good therapeutic lifestyle changes which include good diet and exercise. Fall precautions discussed with patient. If an FOBT was given today- please return it to our front desk. If you are over 45 years old - you may need Prevnar 78 or the adult Pneumonia vaccine.  Flu Shots are still available at our office. If you still haven't had one please call to set up a nurse visit to get one.   After your visit with [place] today you will receive a survey in the mail or online from [employer] regarding your care with [place]. Please take a moment to fill this out. Your feedback is very important to [place] as you can help [place] better understand your patient needs as well as improve your experience and satisfaction. WE CARE ABOUT YOU!!!   We will arrange for you to have a visit with the cardiologist for follow-up of your history of coronary artery disease Continue follow-up with urology When your bladder treatments are completed, tried to get out and get more exercise Continue to watch your diet and take your Lipitor as you are doing   Arrie Senate MD

## 2014-05-31 ENCOUNTER — Telehealth: Payer: Self-pay | Admitting: Family Medicine

## 2014-05-31 DIAGNOSIS — C679 Malignant neoplasm of bladder, unspecified: Secondary | ICD-10-CM | POA: Diagnosis not present

## 2014-05-31 DIAGNOSIS — Z5111 Encounter for antineoplastic chemotherapy: Secondary | ICD-10-CM | POA: Diagnosis not present

## 2014-05-31 LAB — VITAMIN D 25 HYDROXY (VIT D DEFICIENCY, FRACTURES): Vit D, 25-Hydroxy: 25.5 ng/mL — ABNORMAL LOW (ref 30.0–100.0)

## 2014-05-31 LAB — BMP8+EGFR
BUN/Creatinine Ratio: 23 (ref 11–26)
BUN: 19 mg/dL (ref 8–27)
CO2: 25 mmol/L (ref 18–29)
Calcium: 9.6 mg/dL (ref 8.7–10.3)
Chloride: 98 mmol/L (ref 97–108)
Creatinine, Ser: 0.81 mg/dL (ref 0.57–1.00)
GFR calc Af Amer: 82 mL/min/{1.73_m2} (ref >59)
GFR calc non Af Amer: 71 mL/min/{1.73_m2} (ref >59)
Glucose: 99 mg/dL (ref 65–99)
Potassium: 4.3 mmol/L (ref 3.5–5.2)
Sodium: 139 mmol/L (ref 134–144)

## 2014-05-31 LAB — HEPATIC FUNCTION PANEL
ALT: 13 IU/L (ref 0–32)
AST: 14 IU/L (ref 0–40)
Albumin: 4.5 g/dL (ref 3.5–4.8)
Alkaline Phosphatase: 87 IU/L (ref 39–117)
Bilirubin Total: 0.3 mg/dL (ref 0.0–1.2)
Bilirubin, Direct: 0.09 mg/dL (ref 0.00–0.40)
Total Protein: 7 g/dL (ref 6.0–8.5)

## 2014-05-31 LAB — NMR, LIPOPROFILE
Cholesterol: 239 mg/dL — ABNORMAL HIGH (ref 100–199)
HDL Cholesterol by NMR: 54 mg/dL (ref >39)
HDL Particle Number: 39.3 umol/L (ref >=30.5)
LDL Particle Number: 1772 nmol/L — ABNORMAL HIGH (ref <1000)
LDL Size: 21 nm (ref >20.5)
LDL-C: 146 mg/dL — ABNORMAL HIGH (ref 0–99)
LP-IR Score: 62 — ABNORMAL HIGH (ref <=45)
Small LDL Particle Number: 957 nmol/L — ABNORMAL HIGH (ref <=527)
Triglycerides by NMR: 196 mg/dL — ABNORMAL HIGH (ref 0–149)

## 2014-05-31 NOTE — Telephone Encounter (Signed)
-----   Message from Chipper Herb, MD sent at 05/31/2014  8:03 AM EDT ----- The blood sugar is good at 99. The creatinine, the most important kidney function test is within normal limits. The electrolytes including potassium are within normal limits. All liver function tests are within normal limits. Cholesterol numbers with advanced lipid testing remain elevated but not as high as they were 6 months ago which is good. The total LDL particle number is 1772. 6 months ago it was 2727. That is a significant improvement. The patient should continue with her Lipitor as she is doing along with aggressive therapeutic lifestyle changes. The LDL C remains elevated at 146 and the triglycerides remain elevated at 196. This patient would still be a candidate for one of the new PCS K-9 inhibitors. Please give her some more information regarding this treatment and put her name on the list to be considered for this. The vitamin D level is low at 25.5. Please finish the current vitamin D that she has at home by taking 4000 daily. Then changed to the 5000 strength, and take one daily Monday through Friday

## 2014-06-07 DIAGNOSIS — Z5111 Encounter for antineoplastic chemotherapy: Secondary | ICD-10-CM | POA: Diagnosis not present

## 2014-06-07 DIAGNOSIS — C679 Malignant neoplasm of bladder, unspecified: Secondary | ICD-10-CM | POA: Diagnosis not present

## 2014-06-12 DIAGNOSIS — D225 Melanocytic nevi of trunk: Secondary | ICD-10-CM | POA: Diagnosis not present

## 2014-06-12 DIAGNOSIS — L821 Other seborrheic keratosis: Secondary | ICD-10-CM | POA: Diagnosis not present

## 2014-06-12 DIAGNOSIS — C44729 Squamous cell carcinoma of skin of left lower limb, including hip: Secondary | ICD-10-CM | POA: Diagnosis not present

## 2014-06-12 DIAGNOSIS — L570 Actinic keratosis: Secondary | ICD-10-CM | POA: Diagnosis not present

## 2014-06-12 DIAGNOSIS — X32XXXD Exposure to sunlight, subsequent encounter: Secondary | ICD-10-CM | POA: Diagnosis not present

## 2014-06-14 DIAGNOSIS — Z5111 Encounter for antineoplastic chemotherapy: Secondary | ICD-10-CM | POA: Diagnosis not present

## 2014-06-14 DIAGNOSIS — C679 Malignant neoplasm of bladder, unspecified: Secondary | ICD-10-CM | POA: Diagnosis not present

## 2014-06-28 ENCOUNTER — Ambulatory Visit (INDEPENDENT_AMBULATORY_CARE_PROVIDER_SITE_OTHER): Payer: Medicare Other | Admitting: Pharmacist

## 2014-06-28 VITALS — BP 143/74 | HR 72 | Ht 63.5 in | Wt 162.0 lb

## 2014-06-28 DIAGNOSIS — Z23 Encounter for immunization: Secondary | ICD-10-CM | POA: Diagnosis not present

## 2014-06-28 DIAGNOSIS — Z0000 Encounter for general adult medical examination without abnormal findings: Secondary | ICD-10-CM | POA: Diagnosis not present

## 2014-06-28 DIAGNOSIS — Z1211 Encounter for screening for malignant neoplasm of colon: Secondary | ICD-10-CM

## 2014-06-28 MED ORDER — ASPIRIN EC 81 MG PO TBEC: 81.0000 mg | DELAYED_RELEASE_TABLET | Freq: Every day | ORAL | Status: DC

## 2014-06-28 NOTE — Patient Instructions (Signed)
Thanks for coming in for your Annual Wellness Visit  You received a Prevnar 13 / pneumonia vaccine today.   Also recommended that you start enteric coated aspirin $RemoveBefore'81mg'[id number]$  1 tablet daily  Remember to return you Fecal Occult Blood test / Stool Sample.  Preventive Care for Adults A healthy lifestyle and preventive care can promote health and wellness. Preventive health guidelines for women include the following key practices.  A routine yearly physical is a good way to check with your health care provider about your health and preventive screening. It is a chance to share any concerns and updates on your health and to receive a thorough exam.  Visit your dentist for a routine exam and preventive care every 6 months. Brush your teeth twice a day and floss once a day. Good oral hygiene prevents tooth decay and gum disease.  The frequency of eye exams is based on your age, health, family medical history, use of contact lenses, and other factors. Follow your health care provider's recommendations for frequency of eye exams.  Eat a healthy diet. Foods like vegetables, fruits, whole grains, low-fat dairy products, and lean protein foods contain the nutrients you need without too many calories. Decrease your intake of foods high in solid fats, added sugars, and salt. Eat the right amount of calories for you.Get information about a proper diet from your health care provider, if necessary.  Regular physical exercise is one of the most important things you can do for your health. Most adults should get at least 150 minutes of moderate-intensity exercise (any activity that increases your heart rate and causes you to sweat) each week. In addition, most adults need muscle-strengthening exercises on 2 or more days a week.  Maintain a healthy weight. The body mass index (BMI) is a screening tool to identify possible weight problems. It provides an estimate of body fat based on height and weight. Your health care  provider can find your BMI and can help you achieve or maintain a healthy weight.For adults 20 years and older:  A BMI below 18.5 is considered underweight.  A BMI of 18.5 to 24.9 is normal.  A BMI of 25 to 29.9 is considered overweight.  A BMI of 30 and above is considered obese.  Maintain normal blood lipids and cholesterol levels by exercising and minimizing your intake of saturated fat. Eat a balanced diet with plenty of fruit and vegetables. Blood tests for lipids and cholesterol should begin at age 86 and be repeated every 5 years. If your lipid or cholesterol levels are high, you are over 50, or you are at high risk for heart disease, you may need your cholesterol levels checked more frequently.Ongoing high lipid and cholesterol levels should be treated with medicines if diet and exercise are not working.  If you smoke, find out from your health care provider how to quit. If you do not use tobacco, do not start.  Lung cancer screening is recommended for adults aged 61-80 years who are at high risk for developing lung cancer because of a history of smoking. A yearly low-dose CT scan of the lungs is recommended for people who have at least a 30-pack-year history of smoking and are a current smoker or have quit within the past 15 years. A pack year of smoking is smoking an average of 1 pack of cigarettes a day for 1 year (for example: 1 pack a day for 30 years or 2 packs a day for 15 years). Yearly screening should  continue until the smoker has stopped smoking for at least 15 years. Yearly screening should be stopped for people who develop a health problem that would prevent them from having lung cancer treatment.  If you are pregnant, do not drink alcohol. If you are breastfeeding, be very cautious about drinking alcohol. If you are not pregnant and choose to drink alcohol, do not have more than 1 drink per day. One drink is considered to be 12 ounces (355 mL) of beer, 5 ounces (148 mL) of  wine, or 1.5 ounces (44 mL) of liquor.  Avoid use of street drugs. Do not share needles with anyone. Ask for help if you need support or instructions about stopping the use of drugs.  High blood pressure causes heart disease and increases the risk of stroke. Your blood pressure should be checked at least every 1 to 2 years. Ongoing high blood pressure should be treated with medicines if weight loss and exercise do not work.  If you are 6-27 years old, ask your health care provider if you should take aspirin to prevent strokes.  Diabetes screening involves taking a blood sample to check your fasting blood sugar level. This should be done once every 3 years, after age 54, if you are within normal weight and without risk factors for diabetes. Testing should be considered at a younger age or be carried out more frequently if you are overweight and have at least 1 risk factor for diabetes.  Breast cancer screening is essential preventive care for women. You should practice "breast self-awareness." This means understanding the normal appearance and feel of your breasts and may include breast self-examination. Any changes detected, no matter how small, should be reported to a health care provider. Women in their 28s and 30s should have a clinical breast exam (CBE) by a health care provider as part of a regular health exam every 1 to 3 years. After age 44, women should have a CBE every year. Starting at age 41, women should consider having a mammogram (breast X-ray test) every year. Women who have a family history of breast cancer should talk to their health care provider about genetic screening. Women at a high risk of breast cancer should talk to their health care providers about having an MRI and a mammogram every year.  Breast cancer gene (BRCA)-related cancer risk assessment is recommended for women who have family members with BRCA-related cancers. BRCA-related cancers include breast, ovarian, tubal, and  peritoneal cancers. Having family members with these cancers may be associated with an increased risk for harmful changes (mutations) in the breast cancer genes BRCA1 and BRCA2. Results of the assessment will determine the need for genetic counseling and BRCA1 and BRCA2 testing.  Routine pelvic exams to screen for cancer are no longer recommended for nonpregnant women who are considered low risk for cancer of the pelvic organs (ovaries, uterus, and vagina) and who do not have symptoms. Ask your health care provider if a screening pelvic exam is right for you.  If you have had past treatment for cervical cancer or a condition that could lead to cancer, you need Pap tests and screening for cancer for at least 20 years after your treatment. If Pap tests have been discontinued, your risk factors (such as having a new sexual partner) need to be reassessed to determine if screening should be resumed. Some women have medical problems that increase the chance of getting cervical cancer. In these cases, your health care provider may recommend more  frequent screening and Pap tests.  The HPV test is an additional test that may be used for cervical cancer screening. The HPV test looks for the virus that can cause the cell changes on the cervix. The cells collected during the Pap test can be tested for HPV. The HPV test could be used to screen women aged 63 years and older, and should be used in women of any age who have unclear Pap test results. After the age of 67, women should have HPV testing at the same frequency as a Pap test.  Colorectal cancer can be detected and often prevented. Most routine colorectal cancer screening begins at the age of 43 years and continues through age 87 years. However, your health care provider may recommend screening at an earlier age if you have risk factors for colon cancer. On a yearly basis, your health care provider may provide home test kits to check for hidden blood in the stool.  Use of a small camera at the end of a tube, to directly examine the colon (sigmoidoscopy or colonoscopy), can detect the earliest forms of colorectal cancer. Talk to your health care provider about this at age 38, when routine screening begins. Direct exam of the colon should be repeated every 5-10 years through age 36 years, unless early forms of pre-cancerous polyps or small growths are found.  People who are at an increased risk for hepatitis B should be screened for this virus. You are considered at high risk for hepatitis B if:  You were born in a country where hepatitis B occurs often. Talk with your health care provider about which countries are considered high risk.  Your parents were born in a high-risk country and you have not received a shot to protect against hepatitis B (hepatitis B vaccine).  You have HIV or AIDS.  You use needles to inject street drugs.  You live with, or have sex with, someone who has hepatitis B.  You get hemodialysis treatment.  You take certain medicines for conditions like cancer, organ transplantation, and autoimmune conditions.  Hepatitis C blood testing is recommended for all people born from [birth year] through 1965 and any individual with known risks for hepatitis C.  Practice safe sex. Use condoms and avoid high-risk sexual practices to reduce the spread of sexually transmitted infections (STIs). STIs include gonorrhea, chlamydia, syphilis, trichomonas, herpes, HPV, and human immunodeficiency virus (HIV). Herpes, HIV, and HPV are viral illnesses that have no cure. They can result in disability, cancer, and death.  You should be screened for sexually transmitted illnesses (STIs) including gonorrhea and chlamydia if:  You are sexually active and are younger than 24 years.  You are older than 24 years and your health care provider tells you that you are at risk for this type of infection.  Your sexual activity has changed since you were last screened and  you are at an increased risk for chlamydia or gonorrhea. Ask your health care provider if you are at risk.  If you are at risk of being infected with HIV, it is recommended that you take a prescription medicine daily to prevent HIV infection. This is called preexposure prophylaxis (PrEP). You are considered at risk if:  You are a heterosexual woman, are sexually active, and are at increased risk for HIV infection.  You take drugs by injection.  You are sexually active with a partner who has HIV.  Talk with your health care provider about whether you are at high risk of  being infected with HIV. If you choose to begin PrEP, you should first be tested for HIV. You should then be tested every 3 months for as long as you are taking PrEP.  Osteoporosis is a disease in which the bones lose minerals and strength with aging. This can result in serious bone fractures or breaks. The risk of osteoporosis can be identified using a bone density scan. Women ages 64 years and over and women at risk for fractures or osteoporosis should discuss screening with their health care providers. Ask your health care provider whether you should take a calcium supplement or vitamin D to reduce the rate of osteoporosis.  Menopause can be associated with physical symptoms and risks. Hormone replacement therapy is available to decrease symptoms and risks. You should talk to your health care provider about whether hormone replacement therapy is right for you.  Use sunscreen. Apply sunscreen liberally and repeatedly throughout the day. You should seek shade when your shadow is shorter than you. Protect yourself by wearing long sleeves, pants, a wide-brimmed hat, and sunglasses year round, whenever you are outdoors.  Once a month, do a whole body skin exam, using a mirror to look at the skin on your back. Tell your health care provider of new moles, moles that have irregular borders, moles that are larger than a pencil eraser, or  moles that have changed in shape or color.  Stay current with required vaccines (immunizations).  Influenza vaccine. All adults should be immunized every year.  Tetanus, diphtheria, and acellular pertussis (Td, Tdap) vaccine. Pregnant women should receive 1 dose of Tdap vaccine during each pregnancy. The dose should be obtained regardless of the length of time since the last dose. Immunization is preferred during the 27th-36th week of gestation. An adult who has not previously received Tdap or who does not know her vaccine status should receive 1 dose of Tdap. This initial dose should be followed by tetanus and diphtheria toxoids (Td) booster doses every 10 years. Adults with an unknown or incomplete history of completing a 3-dose immunization series with Td-containing vaccines should begin or complete a primary immunization series including a Tdap dose. Adults should receive a Td booster every 10 years.  Varicella vaccine. An adult without evidence of immunity to varicella should receive 2 doses or a second dose if she has previously received 1 dose. Pregnant females who do not have evidence of immunity should receive the first dose after pregnancy. This first dose should be obtained before leaving the health care facility. The second dose should be obtained 4-8 weeks after the first dose.  Human papillomavirus (HPV) vaccine. Females aged 13-26 years who have not received the vaccine previously should obtain the 3-dose series. The vaccine is not recommended for use in pregnant females. However, pregnancy testing is not needed before receiving a dose. If a female is found to be pregnant after receiving a dose, no treatment is needed. In that case, the remaining doses should be delayed until after the pregnancy. Immunization is recommended for any person with an immunocompromised condition through the age of 31 years if she did not get any or all doses earlier. During the 3-dose series, the second dose  should be obtained 4-8 weeks after the first dose. The third dose should be obtained 24 weeks after the first dose and 16 weeks after the second dose.  Zoster vaccine. One dose is recommended for adults aged 27 years or older unless certain conditions are present.  Measles, mumps, and  rubella (MMR) vaccine. Adults born before [birth year] generally are considered immune to measles and mumps. Adults born in [birth year] or later should have 1 or more doses of MMR vaccine unless there is a contraindication to the vaccine or there is laboratory evidence of immunity to each of the three diseases. A routine second dose of MMR vaccine should be obtained at least 28 days after the first dose for students attending postsecondary schools, health care workers, or international travelers. People who received inactivated measles vaccine or an unknown type of measles vaccine during 1963-1967 should receive 2 doses of MMR vaccine. People who received inactivated mumps vaccine or an unknown type of mumps vaccine before 1979 and are at high risk for mumps infection should consider immunization with 2 doses of MMR vaccine. For females of childbearing age, rubella immunity should be determined. If there is no evidence of immunity, females who are not pregnant should be vaccinated. If there is no evidence of immunity, females who are pregnant should delay immunization until after pregnancy. Unvaccinated health care workers born before [birth year] who lack laboratory evidence of measles, mumps, or rubella immunity or laboratory confirmation of disease should consider measles and mumps immunization with 2 doses of MMR vaccine or rubella immunization with 1 dose of MMR vaccine.  Pneumococcal 13-valent conjugate (PCV13) vaccine. When indicated, a person who is uncertain of her immunization history and has no record of immunization should receive the PCV13 vaccine. An adult aged 1 years or older who has certain medical conditions and has not been  previously immunized should receive 1 dose of PCV13 vaccine. This PCV13 should be followed with a dose of pneumococcal polysaccharide (PPSV23) vaccine. The PPSV23 vaccine dose should be obtained at least 8 weeks after the dose of PCV13 vaccine. An adult aged 44 years or older who has certain medical conditions and previously received 1 or more doses of PPSV23 vaccine should receive 1 dose of PCV13. The PCV13 vaccine dose should be obtained 1 or more years after the last PPSV23 vaccine dose.  Pneumococcal polysaccharide (PPSV23) vaccine. When PCV13 is also indicated, PCV13 should be obtained first. All adults aged 63 years and older should be immunized. An adult younger than age 27 years who has certain medical conditions should be immunized. Any person who resides in a nursing home or long-term care facility should be immunized. An adult smoker should be immunized. People with an immunocompromised condition and certain other conditions should receive both PCV13 and PPSV23 vaccines. People with human immunodeficiency virus (HIV) infection should be immunized as soon as possible after diagnosis. Immunization during chemotherapy or radiation therapy should be avoided. Routine use of PPSV23 vaccine is not recommended for American Indians, [place] Natives, or people younger than 65 years unless there are medical conditions that require PPSV23 vaccine. When indicated, people who have unknown immunization and have no record of immunization should receive PPSV23 vaccine. One-time revaccination 5 years after the first dose of PPSV23 is recommended for people aged 19-64 years who have chronic kidney failure, nephrotic syndrome, asplenia, or immunocompromised conditions. People who received 1-2 doses of PPSV23 before age 68 years should receive another dose of PPSV23 vaccine at age 47 years or later if at least 5 years have passed since the previous dose. Doses of PPSV23 are not needed for people immunized with PPSV23 at or  after age 48 years.  Meningococcal vaccine. Adults with asplenia or persistent complement component deficiencies should receive 2 doses of quadrivalent meningococcal conjugate (MenACWY-D) vaccine. The doses should  be obtained at least 2 months apart. Microbiologists working with certain meningococcal bacteria, [employer] recruits, people at risk during an outbreak, and people who travel to or live in countries with a high rate of meningitis should be immunized. A first-year college student up through age 35 years who is living in a residence hall should receive a dose if she did not receive a dose on or after her 16th birthday. Adults who have certain high-risk conditions should receive one or more doses of vaccine.  Hepatitis A vaccine. Adults who wish to be protected from this disease, have certain high-risk conditions, work with hepatitis A-infected animals, work in hepatitis A research labs, or travel to or work in countries with a high rate of hepatitis A should be immunized. Adults who were previously unvaccinated and who anticipate close contact with an international adoptee during the first 60 days after arrival in the [place] from a country with a high rate of hepatitis A should be immunized.  Hepatitis B vaccine. Adults who wish to be protected from this disease, have certain high-risk conditions, may be exposed to blood or other infectious body fluids, are household contacts or sex partners of hepatitis B positive people, are clients or workers in certain care facilities, or travel to or work in countries with a high rate of hepatitis B should be immunized.  Haemophilus influenzae type b (Hib) vaccine. A previously unvaccinated person with asplenia or sickle cell disease or having a scheduled splenectomy should receive 1 dose of Hib vaccine. Regardless of previous immunization, a recipient of a hematopoietic stem cell transplant should receive a 3-dose series 6-12 months after her successful  transplant. Hib vaccine is not recommended for adults with HIV infection. Preventive Services / Frequency Ages 22 to 35 years  Blood pressure check.** / Every 1 to 2 years.  Lipid and cholesterol check.** / Every 5 years beginning at age 68.  Clinical breast exam.** / Every 3 years for women in their 36s and 65s.  BRCA-related cancer risk assessment.** / For women who have family members with a BRCA-related cancer (breast, ovarian, tubal, or peritoneal cancers).  Pap test.** / Every 2 years from ages 66 through 54. Every 3 years starting at age 105 through age 65 or 56 with a history of 3 consecutive normal Pap tests.  HPV screening.** / Every 3 years from ages 71 through ages 55 to 11 with a history of 3 consecutive normal Pap tests.  Hepatitis C blood test.** / For any individual with known risks for hepatitis C.  Skin self-exam. / Monthly.  Influenza vaccine. / Every year.  Tetanus, diphtheria, and acellular pertussis (Tdap, Td) vaccine.** / Consult your health care provider. Pregnant women should receive 1 dose of Tdap vaccine during each pregnancy. 1 dose of Td every 10 years.  Varicella vaccine.** / Consult your health care provider. Pregnant females who do not have evidence of immunity should receive the first dose after pregnancy.  HPV vaccine. / 3 doses over 6 months, if 62 and younger. The vaccine is not recommended for use in pregnant females. However, pregnancy testing is not needed before receiving a dose.  Measles, mumps, rubella (MMR) vaccine.** / You need at least 1 dose of MMR if you were born in 1957 or later. You may also need a 2nd dose. For females of childbearing age, rubella immunity should be determined. If there is no evidence of immunity, females who are not pregnant should be vaccinated. If there is no evidence of  immunity, females who are pregnant should delay immunization until after pregnancy.  Pneumococcal 13-valent conjugate (PCV13) vaccine.** / Consult  your health care provider.  Pneumococcal polysaccharide (PPSV23) vaccine.** / 1 to 2 doses if you smoke cigarettes or if you have certain conditions.  Meningococcal vaccine.** / 1 dose if you are age 18 to 30 years and a Market researcher living in a residence hall, or have one of several medical conditions, you need to get vaccinated against meningococcal disease. You may also need additional booster doses.  Hepatitis A vaccine.** / Consult your health care provider.  Hepatitis B vaccine.** / Consult your health care provider.  Haemophilus influenzae type b (Hib) vaccine.** / Consult your health care provider. Ages 16 to 68 years  Blood pressure check.** / Every 1 to 2 years.  Lipid and cholesterol check.** / Every 5 years beginning at age 78 years.  Lung cancer screening. / Every year if you are aged 62-80 years and have a 30-pack-year history of smoking and currently smoke or have quit within the past 15 years. Yearly screening is stopped once you have quit smoking for at least 15 years or develop a health problem that would prevent you from having lung cancer treatment.  Clinical breast exam.** / Every year after age 54 years.  BRCA-related cancer risk assessment.** / For women who have family members with a BRCA-related cancer (breast, ovarian, tubal, or peritoneal cancers).  Mammogram.** / Every year beginning at age 19 years and continuing for as long as you are in good health. Consult with your health care provider.  Pap test.** / Every 3 years starting at age 49 years through age 41 or 64 years with a history of 3 consecutive normal Pap tests.  HPV screening.** / Every 3 years from ages 7 years through ages 72 to 12 years with a history of 3 consecutive normal Pap tests.  Fecal occult blood test (FOBT) of stool. / Every year beginning at age 62 years and continuing until age 67 years. You may not need to do this test if you get a colonoscopy every 10  years.  Flexible sigmoidoscopy or colonoscopy.** / Every 5 years for a flexible sigmoidoscopy or every 10 years for a colonoscopy beginning at age 45 years and continuing until age 84 years.  Hepatitis C blood test.** / For all people born from [birth year] through 1967 and any individual with known risks for hepatitis C.  Skin self-exam. / Monthly.  Influenza vaccine. / Every year.  Tetanus, diphtheria, and acellular pertussis (Tdap/Td) vaccine.** / Consult your health care provider. Pregnant women should receive 1 dose of Tdap vaccine during each pregnancy. 1 dose of Td every 10 years.  Varicella vaccine.** / Consult your health care provider. Pregnant females who do not have evidence of immunity should receive the first dose after pregnancy.  Zoster vaccine.** / 1 dose for adults aged 55 years or older.  Measles, mumps, rubella (MMR) vaccine.** / You need at least 1 dose of MMR if you were born in 1957 or later. You may also need a 2nd dose. For females of childbearing age, rubella immunity should be determined. If there is no evidence of immunity, females who are not pregnant should be vaccinated. If there is no evidence of immunity, females who are pregnant should delay immunization until after pregnancy.  Pneumococcal 13-valent conjugate (PCV13) vaccine.** / Consult your health care provider.  Pneumococcal polysaccharide (PPSV23) vaccine.** / 1 to 2 doses if you smoke cigarettes or if you  have certain conditions.  Meningococcal vaccine.** / Consult your health care provider.  Hepatitis A vaccine.** / Consult your health care provider.  Hepatitis B vaccine.** / Consult your health care provider.  Haemophilus influenzae type b (Hib) vaccine.** / Consult your health care provider. Ages 8 years and over  Blood pressure check.** / Every 1 to 2 years.  Lipid and cholesterol check.** / Every 5 years beginning at age 59 years.  Lung cancer screening. / Every year if you are aged 56-80 years  and have a 30-pack-year history of smoking and currently smoke or have quit within the past 15 years. Yearly screening is stopped once you have quit smoking for at least 15 years or develop a health problem that would prevent you from having lung cancer treatment.  Clinical breast exam.** / Every year after age 52 years.  BRCA-related cancer risk assessment.** / For women who have family members with a BRCA-related cancer (breast, ovarian, tubal, or peritoneal cancers).  Mammogram.** / Every year beginning at age 3 years and continuing for as long as you are in good health. Consult with your health care provider.  Pap test.** / Every 3 years starting at age 59 years through age 67 or 34 years with 3 consecutive normal Pap tests. Testing can be stopped between 65 and 70 years with 3 consecutive normal Pap tests and no abnormal Pap or HPV tests in the past 10 years.  HPV screening.** / Every 3 years from ages 68 years through ages 77 or 4 years with a history of 3 consecutive normal Pap tests. Testing can be stopped between 65 and 70 years with 3 consecutive normal Pap tests and no abnormal Pap or HPV tests in the past 10 years.  Fecal occult blood test (FOBT) of stool. / Every year beginning at age 45 years and continuing until age 25 years. You may not need to do this test if you get a colonoscopy every 10 years.  Flexible sigmoidoscopy or colonoscopy.** / Every 5 years for a flexible sigmoidoscopy or every 10 years for a colonoscopy beginning at age 66 years and continuing until age 9 years.  Hepatitis C blood test.** / For all people born from [birth year] through 1967 and any individual with known risks for hepatitis C.  Osteoporosis screening.** / A one-time screening for women ages 11 years and over and women at risk for fractures or osteoporosis.  Skin self-exam. / Monthly.  Influenza vaccine. / Every year.  Tetanus, diphtheria, and acellular pertussis (Tdap/Td) vaccine.** / 1 dose of Td  every 10 years.  Varicella vaccine.** / Consult your health care provider.  Zoster vaccine.** / 1 dose for adults aged 7 years or older.  Pneumococcal 13-valent conjugate (PCV13) vaccine.** / Consult your health care provider.  Pneumococcal polysaccharide (PPSV23) vaccine.** / 1 dose for all adults aged 61 years and older.  Meningococcal vaccine.** / Consult your health care provider.  Hepatitis A vaccine.** / Consult your health care provider.  Hepatitis B vaccine.** / Consult your health care provider.  Haemophilus influenzae type b (Hib) vaccine.** / Consult your health care provider. ** Family history and personal history of risk and conditions may change your health care provider's recommendations. Document Released: 04/22/2001 Document Revised: 07/11/2013 Document Reviewed: 07/22/2010 [facility] Patient Information 2015 [place], [place]. This information is not intended to replace advice given to you by your health care provider. Make sure you discuss any questions you have with your health care provider.

## 2014-06-28 NOTE — Progress Notes (Signed)
Patient ID: TOSCA PLETZ, female   DOB: 1940/09/20, 74 y.o.   MRN: [id number]   Subjective:   Gabriela Smith is a 74 y.o. female who presents for an Initial Medicare Annual Wellness Visit.  Current Medications (verified) Outpatient Encounter Prescriptions as of 06/28/2014  Medication Sig  . atorvastatin (LIPITOR) 10 MG tablet Take 10 mg by mouth every other day.   . calcium carbonate (TUMS - DOSED IN MG ELEMENTAL CALCIUM) 500 MG chewable tablet Chew 1 tablet by mouth as needed for indigestion or heartburn.  . cholecalciferol (VITAMIN D) 1000 UNITS tablet Take 3,000 Units by mouth daily.  [name] Co-Enzyme Q10 200 MG CAPS Take 1 capsule by mouth daily.  . Flaxseed, Linseed, (FLAX SEED OIL) 1000 MG CAPS Take 1 capsule by mouth daily.   . hydrochlorothiazide (HYDRODIURIL) 25 MG tablet Take 1 tablet (25 mg total) by mouth daily. (Patient taking differently: Take 25 mg by mouth every morning. )  . metoprolol tartrate (LOPRESSOR) 25 MG tablet TAKE (1) TABLET TWICE A DAY.  . traMADol (ULTRAM) 50 MG tablet Take 1 tablet (50 mg total) by mouth every 6 (six) hours as needed.  . [DISCONTINUED] Coenzyme Q10 (COQ-10) 100 MG CAPS Take 1 capsule by mouth daily.  [name] aspirin EC 81 MG tablet Take 1 tablet (81 mg total) by mouth daily.  . [DISCONTINUED] Cholecalciferol (VITAMIN D3) 2000 UNITS TABS Take 1 capsule by mouth daily.    Allergies (verified) Fish allergy; Lasix; Macrodantin; Niaspan; Statins; Vicodin; Zetia; Codeine; and Sulfa antibiotics   History: Past Medical History  Diagnosis Date  . Hypertension 1993  . Hyperlipidemia 1993  . Frequency of urination   . Nocturia   . History of bladder cancer followed by dr Karsten Ro    hx  TCC of bladder ,  Ta G3-  first occurence 02-20-2012--  s/p BCG tx's  and  mitomycin C  . Osteopenia   . Bladder tumor   . Sigmoid diverticulosis   . CAD (coronary artery disease) CARDIOLOGIST- [facility]--  VISIT 01-02-10 IN EPIC    1993-- PTCA. Pt describes a near total  blockage of apparently the LAD and a 65% stenosis elsewhere.  . Arthritis     OA lt knee- cortizone inj. q 4 months AND OA ALSO IN BACK. Had knee replaced-issue resolved  . Cancer    Past Surgical History  Procedure Laterality Date  . Umbilical hernia repair  2001  (approx)  . Transurethral resection of bladder tumor  03/14/2011    Procedure: TRANSURETHRAL RESECTION OF BLADDER TUMOR (TURBT);  Surgeon: Claybon Jabs, MD;  Location: [facility];  Service: Urology;  Laterality: N/A;  . Cystoscopy with biopsy  05/09/2011    Procedure: CYSTOSCOPY WITH BIOPSY;  Surgeon: Claybon Jabs, MD;  [facility];  Service: Urology;  Laterality: N/A;  WITH BLADDER BIOPSY GYRUS  . Transurethral resection of bladder tumor  05/09/2011    Procedure: TRANSURETHRAL RESECTION OF BLADDER TUMOR (TURBT);  Surgeon: Claybon Jabs, MD;  Location: [facility];  Service: Urology;  Laterality: N/A;  . Transurethral resection of bladder tumor  03/08/2012    Procedure: TRANSURETHRAL RESECTION OF BLADDER TUMOR (TURBT);  Surgeon: Claybon Jabs, MD;  Location: [facility];  Service: Urology;  Laterality: N/A;  . Total knee arthroplasty Left 11/22/2012    Procedure: LEFT TOTAL KNEE ARTHROPLASTY;  Surgeon: Mauri Pole, MD;  Location: [facility];  Service: Orthopedics;  Laterality: Left;  .  Coronary angioplasty  1993    to LAD  . Cholecystectomy  2003 (approx)  . Colonoscopy  05-31-2003  . Transurethral resection of bladder tumor with gyrus (turbt-gyrus) N/A 03/24/2014    Procedure: TRANSURETHRAL RESECTION OF BLADDER TUMOR WITH GYRUS (TURBT-GYRUS);  Surgeon: Claybon Jabs, MD;  Location: [facility];  Service: Urology;  Laterality: N/A;   Family History  Problem Relation Age of Onset  . Coronary artery disease Father 53  . Heart disease Mother   . Osteoporosis Sister   . Coronary artery disease Daughter   . Heart disease Son    Social History     Occupational History  . Not on file.   Social History Main Topics  . Smoking status: Former Smoker -- 10 years    Types: Cigarettes    Quit date: 03/09/1976  . Smokeless tobacco: Never Used  . Alcohol Use: 2.4 oz/week    4 Glasses of wine per week     Comment: Occasionally  . Drug Use: No  . Sexual Activity: No    Do you feel safe at home?  Yes  Dietary issues and exercise activities: Current Exercise Habits:: Home exercise routine, Type of exercise: Other - see comments;walking, Time (Minutes): > 60, Frequency (Times/Week): > 6, Weekly Exercise (Minutes/Week): 0, Intensity: Moderate  Objective:    Today's Vitals   06/28/14 1018  BP: 143/74  Pulse: 72  Height: 5' 3.5" (1.613 m)  Weight: 162 lb (73.483 kg)   Body mass index is 28.24 kg/(m^2).  Activities of Daily Living In your present state of health, do you have any difficulty performing the following activities: 06/28/2014 03/24/2014  Hearing? N N  Vision? N N  Difficulty concentrating or making decisions? N N  Walking or climbing stairs? N N  Dressing or bathing? N N  Doing errands, shopping? N -  Preparing Food and eating ? N -  Using the Toilet? N -  In the past six months, have you accidently leaked urine? N -  Do you have problems with loss of bowel control? N -  Managing your Medications? N -  Managing your Finances? N -  Housekeeping or managing your Housekeeping? N -    Are there smokers in your home (other than you)? No   Cardiac Risk Factors include: advanced age (>4men, >57 women)  Depression Screen PHQ 2/9 Scores 06/28/2014 05/30/2014 11/28/2013 11/04/2012  PHQ - 2 Score 0 0 0 0    Fall Risk Fall Risk  06/28/2014 05/30/2014 11/28/2013 11/04/2012  Falls in the past year? No No No No    Cognitive Function: No flowsheet data found.  Immunizations and Health Maintenance Immunization History  Administered Date(s) Administered  . Influenza,inj,Quad PF,36+ Mos 12/21/2012, 01/04/2014  .  Pneumococcal Conjugate-13 06/28/2014  . Pneumococcal Polysaccharide-23 11/04/2012  . Tdap 10/09/2010   There are no preventive care reminders to display for this patient.  Patient Care Team: Chipper Herb, MD as PCP - General (Family Medicine)  Indicate any recent Medical Services you may have received from other than [facility] providers in the past year (date may be approximate).    Assessment:    Annual Wellness Visit    Screening Tests Health Maintenance  Topic Date Due  . PNA vac Low Risk Adult (2 of 2 - PCV13) 06/30/2014 (Originally 11/04/2013)  . COLONOSCOPY  07/23/2015 (Originally 05/08/2013)  . INFLUENZA VACCINE  10/09/2014  . MAMMOGRAM  02/08/2015  . DEXA SCAN  04/11/2015  . TETANUS/TDAP  10/08/2020  .  ZOSTAVAX  Completed        Plan:   During the course of the visit Tawan was educated and counseled about the following appropriate screening and preventive services:   Vaccines to include Pneumoccal, Influenza, Hepatitis B, Td, Zostavax-patient received Prevnar 13 today and is UTD on all vaccines  Colorectal cancer -patient had FOBT 05/2013 and is UTD.   Cardiovascular disease screening-recommended patient start aspirin 81 mg and will check to see if increases risk of bleeding with her bladder cancer. BP is controlled at 143/74 with meds. Patient does have regular exercise regimen of golf with husband 2-3x/week. Patient takes Lipitor every other day due to muscle aches with daily dosing. Her lipids are improving but still relatively high.   Diabetes screening-patient had recent fasting glucose of 99 which does not put her at risk for diabetes  Bone Denisty / Osteoporosis Screening-BMD was last measured 04/2013 and is UTD. Patient has osteopenia and is currently not on any meds.  Mammogram-patient has scheduled an appointment for a mammogram  Glaucoma screening-patient sees Dr[facility] Prudencio Burly [facility].   Advanced Directives-patient has healthcare power of  attorney and declined to make any changes.   Patient Instructions (the written plan) were given to the patient.   Cherre Robins, [facility]   06/28/2014

## 2014-07-03 ENCOUNTER — Ambulatory Visit
Admission: RE | Admit: 2014-07-03 | Discharge: 2014-07-03 | Disposition: A | Discharge status: Home / Self Care | Payer: Medicare Other | Source: Ambulatory Visit

## 2014-07-03 DIAGNOSIS — Z1231 Encounter for screening mammogram for malignant neoplasm of breast: Secondary | ICD-10-CM | POA: Diagnosis not present

## 2014-07-05 IMAGING — CR DG CHEST 2V
2 series · 2 of 2 positions shown · non-contrast
Comparison: None

CLINICAL DATA: Preoperative evaluation for left total knee
arthroplasty, history coronary artery disease, hypertension,
bladder cancer

CHEST - 2 VIEW

[w chest pa]
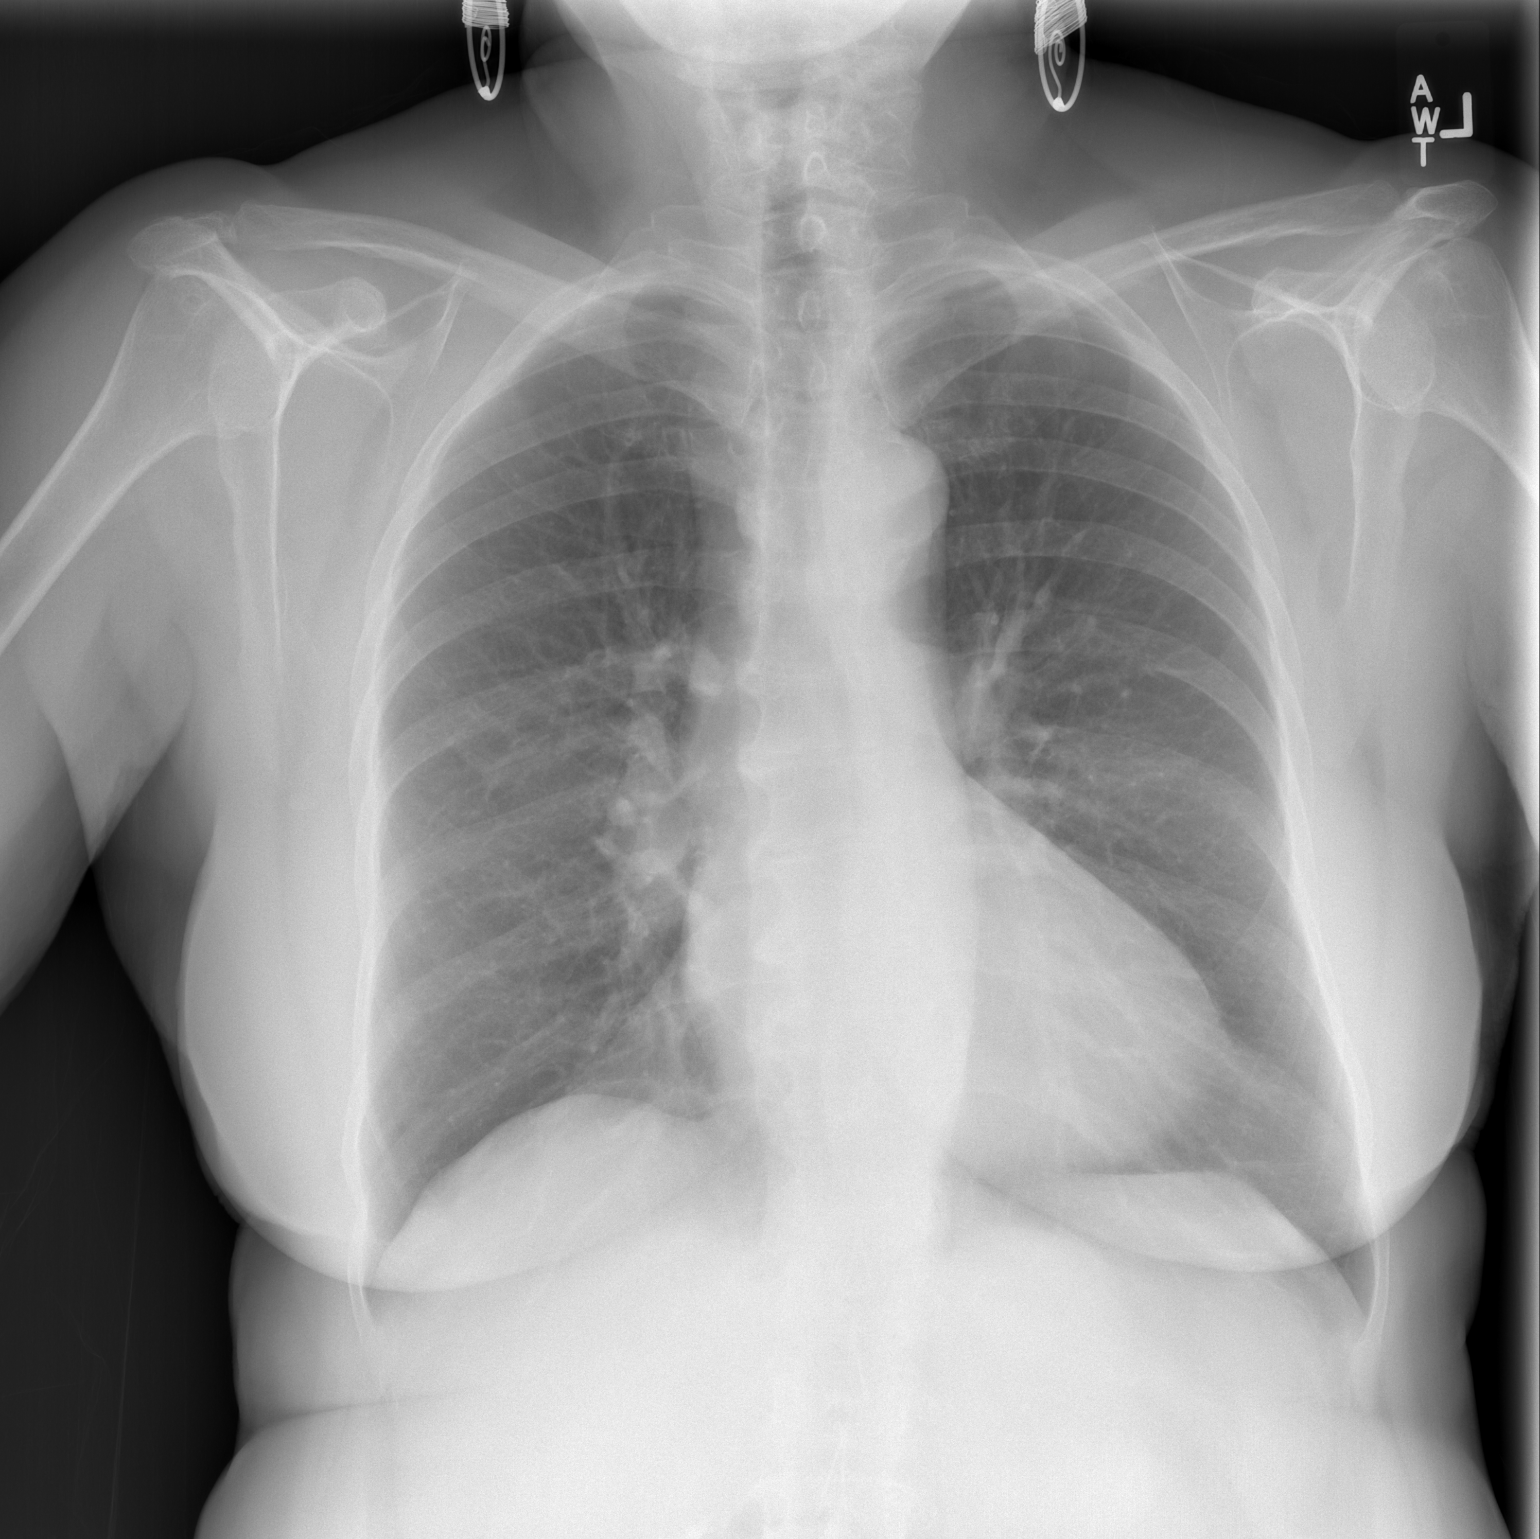

[w chest lat]
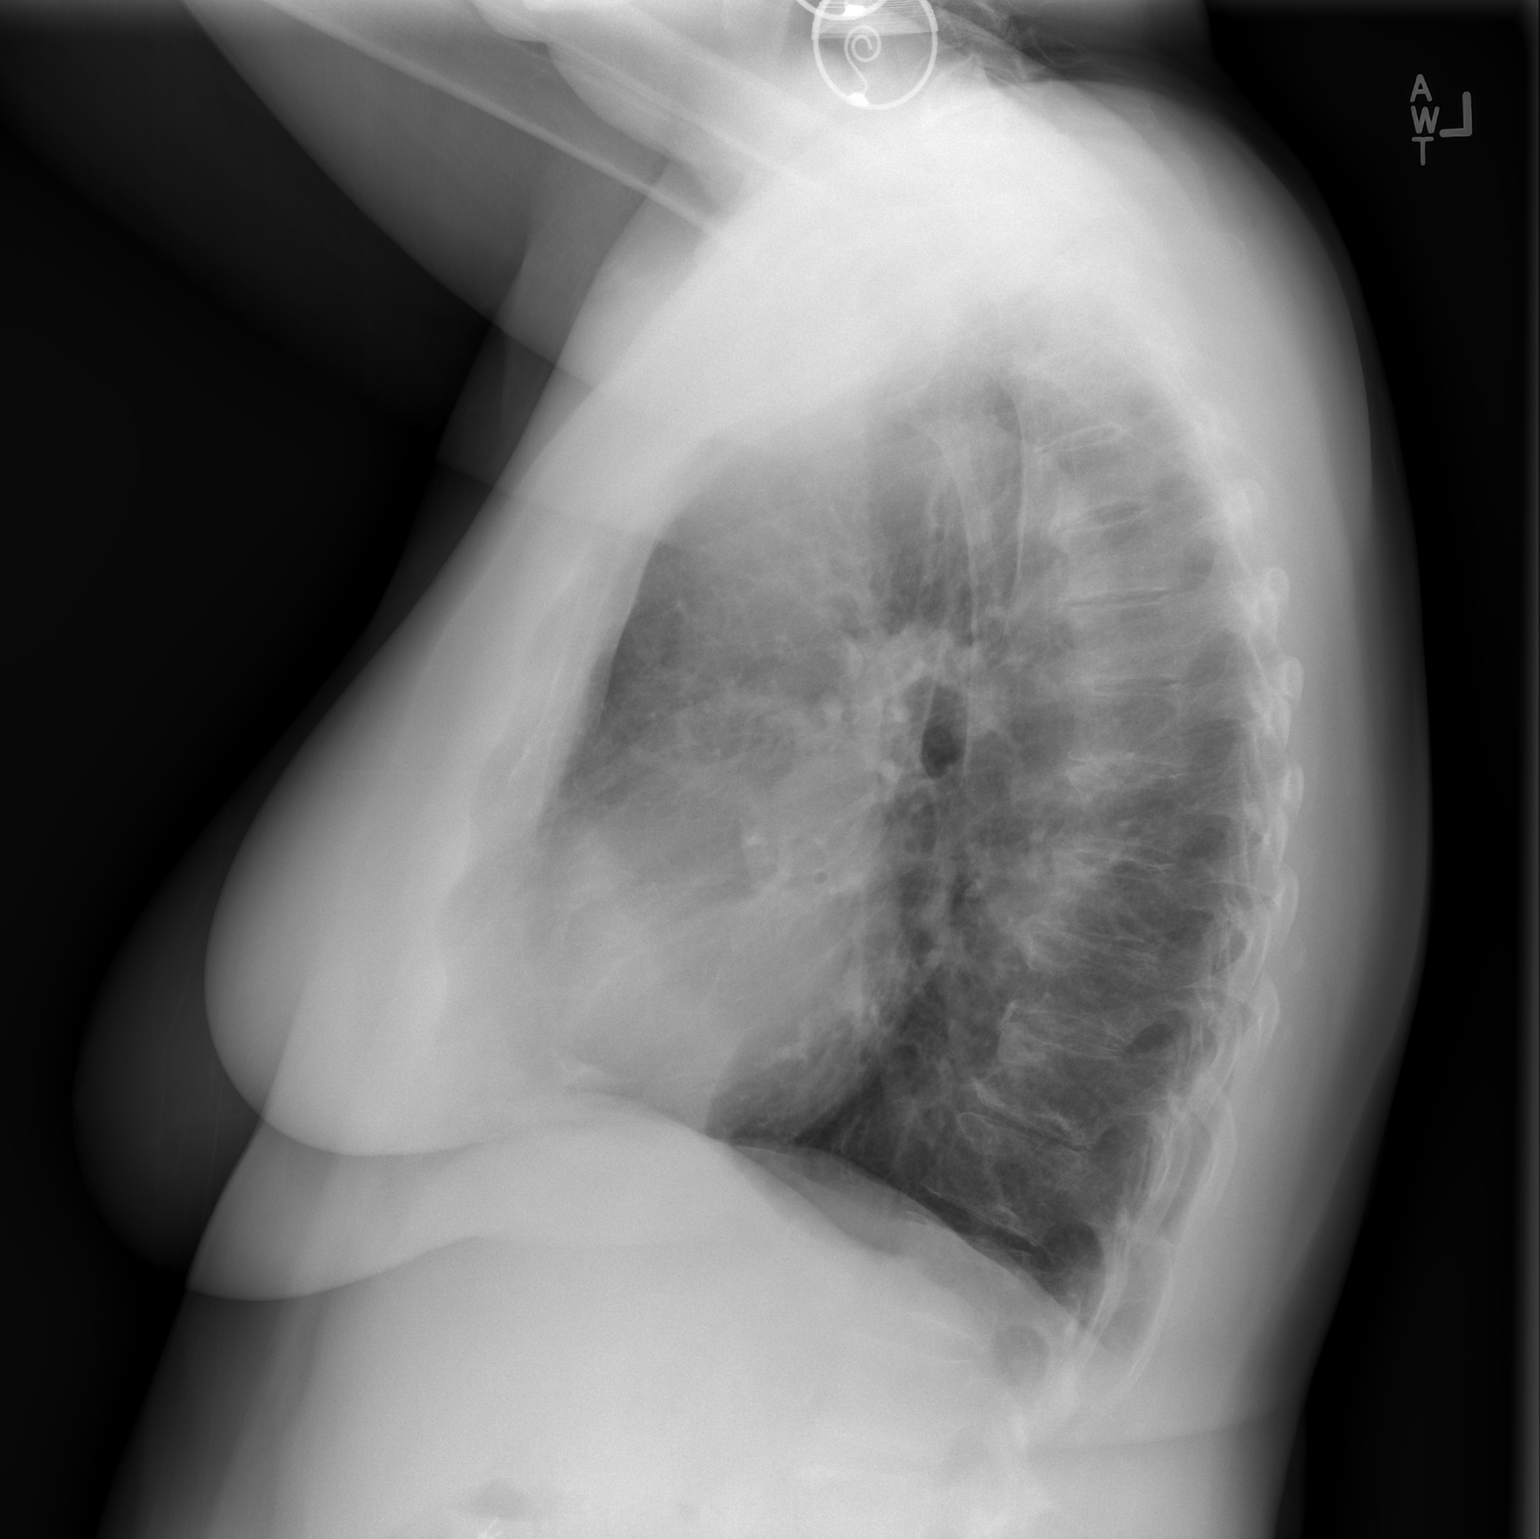

[2 of 2 positions shown; findings below may reference images not displayed]

FINDINGS: Upper-normal size of cardiac silhouette.
Mediastinal contours and pulmonary vascularity normal.
Lungs clear.
No pleural effusion or pneumothorax.
Mild scattered endplate spur formation at multiple levels of the
thoracic spine.
Question mild osseous demineralization.
IMPRESSION: No acute abnormalities.

## 2014-07-28 ENCOUNTER — Other Ambulatory Visit: Payer: Medicare Other

## 2014-07-28 DIAGNOSIS — Z1211 Encounter for screening for malignant neoplasm of colon: Secondary | ICD-10-CM

## 2014-07-28 NOTE — Progress Notes (Signed)
Lab only 

## 2014-07-30 LAB — FECAL OCCULT BLOOD, IMMUNOCHEMICAL: Fecal Occult Bld: NEGATIVE

## 2014-08-03 DIAGNOSIS — X32XXXD Exposure to sunlight, subsequent encounter: Secondary | ICD-10-CM | POA: Diagnosis not present

## 2014-08-03 DIAGNOSIS — Z08 Encounter for follow-up examination after completed treatment for malignant neoplasm: Secondary | ICD-10-CM | POA: Diagnosis not present

## 2014-08-03 DIAGNOSIS — Z85828 Personal history of other malignant neoplasm of skin: Secondary | ICD-10-CM | POA: Diagnosis not present

## 2014-08-03 DIAGNOSIS — L570 Actinic keratosis: Secondary | ICD-10-CM | POA: Diagnosis not present

## 2014-08-09 ENCOUNTER — Ambulatory Visit: Payer: Medicare Other | Admitting: Physician Assistant

## 2014-08-09 ENCOUNTER — Encounter: Payer: Self-pay | Admitting: Physician Assistant

## 2014-08-09 ENCOUNTER — Ambulatory Visit (INDEPENDENT_AMBULATORY_CARE_PROVIDER_SITE_OTHER): Payer: Medicare Other | Admitting: Physician Assistant

## 2014-08-09 VITALS — BP 166/85 | HR 82 | Temp 97.5°F | Ht 63.5 in | Wt 178.4 lb

## 2014-08-09 DIAGNOSIS — M25551 Pain in right hip: Secondary | ICD-10-CM

## 2014-08-09 DIAGNOSIS — M25512 Pain in left shoulder: Secondary | ICD-10-CM

## 2014-08-09 MED ORDER — MELOXICAM 15 MG PO TABS: 15.0000 mg | ORAL_TABLET | Freq: Every day | ORAL | Status: DC

## 2014-08-09 NOTE — Patient Instructions (Signed)

## 2014-08-09 NOTE — Progress Notes (Signed)
Subjective:     Patient ID: Gabriela Smith, female   DOB: 1940/09/30, 74 y.o.   MRN: [id number]  HPI Pt with 2 week hx of L shoulder pain Sx started after doing a lot or hoeing in the garden Pain to the ant shoulder running down the ant arm Denies any numbness to the arm No hx of prev surgery to the shoulder Pt also with some  R ant groin pain for around the same period of time + hx of bladder Ca with radiation tx Last tx ~ 1 month ago Pt using OTC NSAID with some sx improvement  Review of Systems     Objective:   Physical Exam No ecchy/edema to the L shoulder Sl TTP along the ant L shoulder and along bicep Decrease in ROM of the L shoulder due to sx- mainly lateral abduction Stressing biceps reproduces pain FROM of the elbow and wrist w/o sx Good grip strength Pulses/sensroy good + R groin TTP No sx with int/ext rotation of the R hip + sx with lateral abduction No sx with flex  Good strength Pulses/sensory intact distal    Assessment:     Left shoulder pain  Right hip pain      Plan:     Mobic daily for 2 weeks Hold all other NSAIDS Heat/Ice Activities as tol F/U prn

## 2014-09-13 DIAGNOSIS — C679 Malignant neoplasm of bladder, unspecified: Secondary | ICD-10-CM | POA: Diagnosis not present

## 2014-10-21 ENCOUNTER — Other Ambulatory Visit: Payer: Self-pay | Admitting: Family Medicine

## 2014-10-30 ENCOUNTER — Encounter: Payer: Self-pay | Admitting: Family

## 2014-10-30 ENCOUNTER — Ambulatory Visit (INDEPENDENT_AMBULATORY_CARE_PROVIDER_SITE_OTHER): Payer: Medicare Other | Admitting: Family

## 2014-10-30 VITALS — BP 165/97 | HR 99 | Temp 97.3°F | Ht 63.5 in | Wt 178.0 lb

## 2014-10-30 DIAGNOSIS — L298 Other pruritus: Secondary | ICD-10-CM

## 2014-10-30 DIAGNOSIS — N898 Other specified noninflammatory disorders of vagina: Secondary | ICD-10-CM

## 2014-10-30 DIAGNOSIS — B3731 Acute candidiasis of vulva and vagina: Secondary | ICD-10-CM

## 2014-10-30 DIAGNOSIS — B373 Candidiasis of vulva and vagina: Secondary | ICD-10-CM | POA: Diagnosis not present

## 2014-10-30 LAB — POCT WET PREP (WET MOUNT)

## 2014-10-30 MED ORDER — FLUCONAZOLE 150 MG PO TABS: 150.0000 mg | ORAL_TABLET | ORAL | Status: DC

## 2014-10-30 NOTE — Progress Notes (Signed)
   Subjective:    Patient ID: Gabriela Smith, female    DOB: [birth year], 74 y.o.   MRN: [id number]  Pt presents to the office today for vaginal itching. Pt has bladder cancer and received her last treatment April 2016. Pt states she had a follow up in July with her Urologists and states she was having some irration then, but the burning, itching, and irration has become worse. PT states she has used two monastat creams which "calms it down" but has not gone away.   Vaginal Itching The patient's primary symptoms include genital itching. The patient's pertinent negatives include no genital lesions, genital odor, pelvic pain, vaginal bleeding or vaginal discharge. This is a recurrent problem. The current episode started 1 to 4 weeks ago. The problem occurs intermittently. The problem has been unchanged. The pain is mild. Pertinent negatives include no headaches. She has tried antifungals for the symptoms. The treatment provided mild relief.      Review of Systems  Constitutional: Negative.   HENT: Negative.   Eyes: Negative.   Respiratory: Negative.  Negative for shortness of breath.   Cardiovascular: Negative.  Negative for palpitations.  Gastrointestinal: Negative.   Endocrine: Negative.   Genitourinary: Negative.  Negative for vaginal discharge and pelvic pain.  Musculoskeletal: Negative.   Neurological: Negative.  Negative for headaches.  Hematological: Negative.   Psychiatric/Behavioral: Negative.   All other systems reviewed and are negative.      Objective:   Physical Exam  Constitutional: She is oriented to person, place, and time. She appears well-developed and well-nourished. No distress.  Eyes: Pupils are equal, round, and reactive to light.  Neck: Normal range of motion. Neck supple. No thyromegaly present.  Cardiovascular: Normal rate, regular rhythm, normal heart sounds and intact distal pulses.   No murmur heard. Pulmonary/Chest: Effort normal and breath sounds normal.  No respiratory distress. She has no wheezes.  Abdominal: Soft. Bowel sounds are normal. She exhibits no distension. There is no tenderness.  Musculoskeletal: Normal range of motion. She exhibits no edema or tenderness.  Neurological: She is alert and oriented to person, place, and time. She has normal reflexes. No cranial nerve deficit.  Skin: Skin is warm and dry.  Psychiatric: She has a normal mood and affect. Her behavior is normal. Judgment and thought content normal.  Vitals reviewed.   BP 165/97 mmHg  Pulse 99  Temp(Src) 97.3 F (36.3 C) (Oral)  Ht 5' 3.5" (1.613 m)  Wt 178 lb (80.74 kg)  BMI 31.03 kg/m2       Assessment & Plan:  1. Vaginal itching - POCT Wet Prep [facility])  2. Vaginal yeast infection -Keep clean and dry -Cotton underwear -RTO prn - fluconazole (DIFLUCAN) 150 MG tablet; Take 1 tablet (150 mg total) by mouth every 3 (three) days.  Dispense: 3 tablet; Refill: 0  [name], FNP

## 2014-10-30 NOTE — Patient Instructions (Signed)

## 2014-11-08 ENCOUNTER — Ambulatory Visit: Payer: Medicare Other | Admitting: Family Medicine

## 2014-11-09 ENCOUNTER — Telehealth: Payer: Self-pay | Admitting: Family

## 2014-11-09 DIAGNOSIS — B3731 Acute candidiasis of vulva and vagina: Secondary | ICD-10-CM

## 2014-11-09 DIAGNOSIS — B373 Candidiasis of vulva and vagina: Secondary | ICD-10-CM

## 2014-11-09 MED ORDER — FLUCONAZOLE 150 MG PO TABS: 150.0000 mg | ORAL_TABLET | ORAL | Status: DC

## 2014-11-09 MED ORDER — CLOTRIMAZOLE 2 % VA CREA: 1.0000 | TOPICAL_CREAM | Freq: Every day | VAGINAL | Status: DC

## 2014-11-09 NOTE — Telephone Encounter (Signed)
Sent in RX for diflucan for one week for 6 months and vaginal cream to use at bedtime

## 2014-11-27 ENCOUNTER — Other Ambulatory Visit: Payer: Self-pay | Admitting: Family

## 2014-11-27 DIAGNOSIS — B373 Candidiasis of vulva and vagina: Secondary | ICD-10-CM

## 2014-11-27 DIAGNOSIS — B3731 Acute candidiasis of vulva and vagina: Secondary | ICD-10-CM

## 2014-11-27 MED ORDER — NYSTATIN 100000 UNIT/GM EX OINT: 1.0000 "application " | TOPICAL_OINTMENT | Freq: Two times a day (BID) | CUTANEOUS | Status: DC

## 2014-11-27 MED ORDER — FLUCONAZOLE 150 MG PO TABS
150.0000 mg | ORAL_TABLET | ORAL | Status: DC
Start: 2014-11-27 — End: 2014-12-04

## 2014-11-29 ENCOUNTER — Other Ambulatory Visit: Payer: Self-pay | Admitting: Family Medicine

## 2014-12-04 ENCOUNTER — Ambulatory Visit (INDEPENDENT_AMBULATORY_CARE_PROVIDER_SITE_OTHER): Payer: Medicare Other

## 2014-12-04 ENCOUNTER — Encounter: Payer: Self-pay | Admitting: Family Medicine

## 2014-12-04 ENCOUNTER — Ambulatory Visit (INDEPENDENT_AMBULATORY_CARE_PROVIDER_SITE_OTHER): Payer: Medicare Other | Admitting: Family Medicine

## 2014-12-04 VITALS — BP 136/88 | HR 74 | Temp 97.8°F | Ht 63.5 in | Wt 180.0 lb

## 2014-12-04 DIAGNOSIS — E785 Hyperlipidemia, unspecified: Secondary | ICD-10-CM

## 2014-12-04 DIAGNOSIS — I10 Essential (primary) hypertension: Secondary | ICD-10-CM | POA: Diagnosis not present

## 2014-12-04 DIAGNOSIS — N898 Other specified noninflammatory disorders of vagina: Secondary | ICD-10-CM

## 2014-12-04 DIAGNOSIS — L298 Other pruritus: Secondary | ICD-10-CM | POA: Diagnosis not present

## 2014-12-04 DIAGNOSIS — I2510 Atherosclerotic heart disease of native coronary artery without angina pectoris: Secondary | ICD-10-CM | POA: Diagnosis not present

## 2014-12-04 DIAGNOSIS — E559 Vitamin D deficiency, unspecified: Secondary | ICD-10-CM

## 2014-12-04 DIAGNOSIS — Z8551 Personal history of malignant neoplasm of bladder: Secondary | ICD-10-CM

## 2014-12-04 DIAGNOSIS — L209 Atopic dermatitis, unspecified: Secondary | ICD-10-CM | POA: Diagnosis not present

## 2014-12-04 DIAGNOSIS — E7849 Other hyperlipidemia: Secondary | ICD-10-CM

## 2014-12-04 DIAGNOSIS — Z7689 Persons encountering health services in other specified circumstances: Secondary | ICD-10-CM | POA: Diagnosis not present

## 2014-12-04 LAB — POCT WET PREP (WET MOUNT): Trichomonas Wet Prep HPF POC: NEGATIVE

## 2014-12-04 MED ORDER — METRONIDAZOLE 0.75 % VA GEL: 1.0000 | Freq: Two times a day (BID) | VAGINAL | Status: DC

## 2014-12-04 NOTE — Patient Instructions (Addendum)
Medicare Annual Wellness Visit  [facility] and the medical providers at [facility] strive to bring you the best medical care.  In doing so we not only want to address your current medical conditions and concerns but also to detect new conditions early and prevent illness, disease and health-related problems.    Medicare offers a yearly Wellness Visit which allows our clinical staff to assess your need for preventative services including immunizations, lifestyle education, counseling to decrease risk of preventable diseases and screening for fall risk and other medical concerns.    This visit is provided free of charge (no copay) for all Medicare recipients. The clinical pharmacists at [facility] have begun to conduct these Wellness Visits which will also include a thorough review of all your medications.    As you primary medical provider recommend that you make an appointment for your Annual Wellness Visit if you have not done so already this year.  You may set up this appointment before you leave today or you may call back ([phone]) and schedule an appointment.  Please make sure when you call that you mention that you are scheduling your Annual Wellness Visit with the clinical pharmacist so that the appointment may be made for the proper length of time.     Continue current medications. Continue good therapeutic lifestyle changes which include good diet and exercise. Fall precautions discussed with patient. If an FOBT was given today- please return it to our front desk. If you are over 14 years old - you may need Prevnar 63 or the adult Pneumonia vaccine.  **Flu shots will be available soon--- please call and schedule a FLU-CLINIC appointment**  After your visit with [place] today you will receive a survey in the mail or online from [employer] regarding your care with [place]. Please take a moment to fill this out. Your feedback is  very important to [place] as you can help [place] better understand your patient needs as well as improve your experience and satisfaction. WE CARE ABOUT YOU!!!   **Please join [place] SEPT.[email or web address], 2016 from 5:00 to 7:00pm for our OPEN HOUSE! Come out and meet our NEW providers** Use cortisone 10 cream sparingly to the affected area of skin twice daily as directed for 7-10 days. If this does not work try the MetroGel vaginal Do not forget to get your flu shot Keep follow-up appointment with urology Continue to wear cotton crotch underclothes

## 2014-12-04 NOTE — Progress Notes (Signed)
Subjective:    Patient ID: Gabriela Smith, female    DOB: [birth year], 74 y.o.   MRN: [id number]  HPI Pt here for follow up and management of chronic medical problems which includes hypertension, hyperlipidemia, and ASCVD. She is taking medications regularly. The patient is pleasant and doing well overall. She does complain of some perineal itching and redness also. Anal redness and itching. She has had multiple courses of antifungal treatments without good results. This occurred following a recent cystoscopy that she have because of her bladder cancer. She has changed to clothing which have cotton crotch. She is using soaps or fabric softeners and detergents that are sent free. She emphasizes again it is more an external problem than an internal problem. She denies chest pain or shortness of breath. She denies trouble swallowing. She does occasionally have heartburn which is related to spicy foods. She denies any nausea vomiting diarrhea or blood in the stool. She has this itching and really thinks it is secondary to some type of allergy. She continues to be followed up regularly for her bladder cancer.      Patient Active Problem List   Diagnosis Date Noted  . Statin intolerance 05/23/2013  . Osteopenia 04/20/2013  . Degenerative disc disease, lumbar 02/08/2013  . Vitamin D deficiency 02/08/2013  . Overweight (BMI 25.0-29.9) 11/24/2012  . S/P left TKA 11/22/2012  . History of bladder cancer 11/04/2012  . Osteoarthritis of left knee 11/04/2012  . Hyperlipidemia 11/28/2008  . Hypertension 11/28/2008  . CORONARY ATHEROSCLEROSIS NATIVE CORONARY ARTERY 11/28/2008   Outpatient Encounter Prescriptions as of 12/04/2014  Medication Sig  . aspirin EC 81 MG tablet Take 1 tablet (81 mg total) by mouth daily.  [name] atorvastatin (LIPITOR) 10 MG tablet Take 10 mg by mouth every other day.   . calcium carbonate (TUMS - DOSED IN MG ELEMENTAL CALCIUM) 500 MG chewable tablet Chew 1 tablet by mouth as needed  for indigestion or heartburn.  . cholecalciferol (VITAMIN D) 1000 UNITS tablet Take 3,000 Units by mouth daily.  . clotrimazole (GYNE-LOTRIMIN 3) 2 % vaginal cream Place 1 Applicatorful vaginally at bedtime.  [name] Co-Enzyme Q10 200 MG CAPS Take 1 capsule by mouth daily.  . Flaxseed, Linseed, (FLAX SEED OIL) 1000 MG CAPS Take 1 capsule by mouth daily.   . hydrochlorothiazide (HYDRODIURIL) 25 MG tablet Take 1 tablet (25 mg total) by mouth daily. (Patient taking differently: Take 25 mg by mouth every morning. )  . metoprolol tartrate (LOPRESSOR) 25 MG tablet TAKE (1) TABLET TWICE A DAY.  [name] nystatin ointment (MYCOSTATIN) Apply 1 application topically 2 (two) times daily.  . [DISCONTINUED] fluconazole (DIFLUCAN) 150 MG tablet Take 1 tablet (150 mg total) by mouth once a week.   No facility-administered encounter medications on file as of 12/04/2014.      Review of Systems  Constitutional: Negative.   HENT: Negative.   Eyes: Negative.   Respiratory: Negative.   Cardiovascular: Negative.   Gastrointestinal: Negative.   Endocrine: Negative.   Genitourinary: Positive for vaginal discharge (with redness and irritation).  Musculoskeletal: Negative.   Skin: Negative.   Allergic/Immunologic: Negative.   Neurological: Negative.   Hematological: Negative.   Psychiatric/Behavioral: Negative.        Objective:   Physical Exam  Constitutional: She is oriented to person, place, and time. She appears well-developed and well-nourished. No distress.  HENT:  Head: Normocephalic and atraumatic.  Right Ear: External ear normal.  Left Ear: External ear normal.  Nose: Nose normal.  Mouth/Throat: Oropharynx is clear and moist.  Eyes: Conjunctivae and EOM are normal. Pupils are equal, round, and reactive to light. Right eye exhibits no discharge. Left eye exhibits no discharge. No scleral icterus.  Neck: Normal range of motion. Neck supple. No thyromegaly present.  Without bruits or thyromegaly    Cardiovascular: Normal rate, regular rhythm, normal heart sounds and intact distal pulses.  Exam reveals no gallop and no friction rub.   No murmur heard. At 72/m  Pulmonary/Chest: Effort normal and breath sounds normal. No respiratory distress. She has no wheezes. She has no rales. She exhibits no tenderness.  Abdominal: Soft. Bowel sounds are normal. She exhibits no mass. There is no tenderness. There is no rebound and no guarding.  Genitourinary:  A vaginal exam was not done this is purely by history that treatment will be provided today.  Musculoskeletal: Normal range of motion. She exhibits no edema or tenderness.  Lymphadenopathy:    She has no cervical adenopathy.  Neurological: She is alert and oriented to person, place, and time. She has normal reflexes. No cranial nerve deficit.  Skin: Skin is warm and dry. No rash noted.  Psychiatric: She has a normal mood and affect. Her behavior is normal. Judgment and thought content normal.  Nursing note and vitals reviewed.  BP 146/66 mmHg  Pulse 74  Temp(Src) 97.8 F (36.6 C) (Oral)  Ht 5' 3.5" (1.613 m)  Wt 180 lb (81.647 kg)  BMI 31.38 kg/m2  Repeat blood pressure 136/88  [facility] reading (PRIMARY) by  Dr.Eola Waldrep-chest x-ray--    heart is slightly enlarged otherwise no active disease                                 Assessment & Plan:  1. Hypertension -Home blood pressures have been good and the patient will continue with her current treatment and with sodium restriction. - BMP8+EGFR - Hepatic function panel - CBC with Differential/Platelet - DG Chest 2 View; Future  2. Vitamin D deficiency -Continue with current treatment pending results of lab work - CBC with Differential/Platelet - Vit D  25 hydroxy (rtn osteoporosis monitoring)  3. Hyperlipidemia, familial, high LDL -The patient has been statin intolerant above certain doses of statins. We will see what the results are today and could consider using a P C S K-9  inhibitors - CBC with Differential/Platelet - NMR, lipoprofile - DG Chest 2 View; Future  4. Bladder cancer -Continue follow-up with urology - CBC with Differential/Platelet  5. ASCVD (arteriosclerotic cardiovascular disease) -Continue follow-up with cardiology as needed - CBC with Differential/Platelet - DG Chest 2 View; Future  6. Atopic dermatitis -Follow-up with urology if no better from this vaginal irritation following her recent cystoscopy  7. Vaginal itching -Try the cortisone 10 as directed  Patient Instructions                       Medicare Annual Wellness Visit  [facility] and the medical providers at [facility] strive to bring you the best medical care.  In doing so we not only want to address your current medical conditions and concerns but also to detect new conditions early and prevent illness, disease and health-related problems.    Medicare offers a yearly Wellness Visit which allows our clinical staff to assess your need for preventative services including immunizations, lifestyle education, counseling to decrease risk of preventable diseases and screening  for fall risk and other medical concerns.    This visit is provided free of charge (no copay) for all Medicare recipients. The clinical pharmacists at [facility] have begun to conduct these Wellness Visits which will also include a thorough review of all your medications.    As you primary medical provider recommend that you make an appointment for your Annual Wellness Visit if you have not done so already this year.  You may set up this appointment before you leave today or you may call back ([phone]) and schedule an appointment.  Please make sure when you call that you mention that you are scheduling your Annual Wellness Visit with the clinical pharmacist so that the appointment may be made for the proper length of time.     Continue current  medications. Continue good therapeutic lifestyle changes which include good diet and exercise. Fall precautions discussed with patient. If an FOBT was given today- please return it to our front desk. If you are over 58 years old - you may need Prevnar 55 or the adult Pneumonia vaccine.  **Flu shots will be available soon--- please call and schedule a FLU-CLINIC appointment**  After your visit with [place] today you will receive a survey in the mail or online from [employer] regarding your care with [place]. Please take a moment to fill this out. Your feedback is very important to [place] as you can help [place] better understand your patient needs as well as improve your experience and satisfaction. WE CARE ABOUT YOU!!!   **Please join [place] SEPT.[email or web address], 2016 from 5:00 to 7:00pm for our OPEN HOUSE! Come out and meet our NEW providers** Use cortisone 10 cream sparingly to the affected area of skin twice daily as directed for 7-10 days. If this does not work try the MetroGel vaginal Do not forget to get your flu shot Keep follow-up appointment with urology Continue to wear cotton crotch underclothes   Arrie Senate MD

## 2014-12-04 NOTE — Addendum Note (Signed)
Addended by: Zannie Cove on: 12/04/2014 09:07 AM   Modules accepted: Orders

## 2014-12-05 LAB — NMR, LIPOPROFILE
Cholesterol: 262 mg/dL — ABNORMAL HIGH (ref 100–199)
HDL Cholesterol by NMR: 56 mg/dL (ref >39)
HDL Particle Number: 42.6 umol/L (ref >=30.5)
LDL Particle Number: 2292 nmol/L — ABNORMAL HIGH (ref <1000)
LDL Size: 21.3 nm (ref >20.5)
LDL-C: 152 mg/dL — ABNORMAL HIGH (ref 0–99)
LP-IR Score: 77 — ABNORMAL HIGH (ref <=45)
Small LDL Particle Number: 1006 nmol/L — ABNORMAL HIGH (ref <=527)
Triglycerides by NMR: 269 mg/dL — ABNORMAL HIGH (ref 0–149)

## 2014-12-05 LAB — CBC WITH DIFFERENTIAL/PLATELET
Basophils Absolute: 0 10*3/uL (ref 0.0–0.2)
Basos: 1 %
EOS (ABSOLUTE): 0.1 10*3/uL (ref 0.0–0.4)
Eos: 2 %
Hematocrit: 38.3 % (ref 34.0–46.6)
Hemoglobin: 12.6 g/dL (ref 11.1–15.9)
Immature Grans (Abs): 0 10*3/uL (ref 0.0–0.1)
Immature Granulocytes: 0 %
Lymphocytes Absolute: 2.2 10*3/uL (ref 0.7–3.1)
Lymphs: 36 %
MCH: 31.5 pg (ref 26.6–33.0)
MCHC: 32.9 g/dL (ref 31.5–35.7)
MCV: 96 fL (ref 79–97)
Monocytes Absolute: 0.4 10*3/uL (ref 0.1–0.9)
Monocytes: 7 %
Neutrophils Absolute: 3.4 10*3/uL (ref 1.4–7.0)
Neutrophils: 54 %
Platelets: 235 10*3/uL (ref 150–379)
RBC: 4 x10E6/uL (ref 3.77–5.28)
RDW: 13.8 % (ref 12.3–15.4)
WBC: 6.2 10*3/uL (ref 3.4–10.8)

## 2014-12-05 LAB — BMP8+EGFR
BUN/Creatinine Ratio: 18 (ref 11–26)
BUN: 14 mg/dL (ref 8–27)
CO2: 24 mmol/L (ref 18–29)
Calcium: 9.5 mg/dL (ref 8.7–10.3)
Chloride: 101 mmol/L (ref 97–108)
Creatinine, Ser: 0.8 mg/dL (ref 0.57–1.00)
GFR calc Af Amer: 83 mL/min/{1.73_m2} (ref >59)
GFR calc non Af Amer: 72 mL/min/{1.73_m2} (ref >59)
Glucose: 110 mg/dL — ABNORMAL HIGH (ref 65–99)
Potassium: 4.7 mmol/L (ref 3.5–5.2)
Sodium: 141 mmol/L (ref 134–144)

## 2014-12-05 LAB — VITAMIN D 25 HYDROXY (VIT D DEFICIENCY, FRACTURES): Vit D, 25-Hydroxy: 33.8 ng/mL (ref 30.0–100.0)

## 2014-12-05 LAB — HEPATIC FUNCTION PANEL
ALT: 16 IU/L (ref 0–32)
AST: 13 IU/L (ref 0–40)
Albumin: 4.4 g/dL (ref 3.5–4.8)
Alkaline Phosphatase: 78 IU/L (ref 39–117)
Bilirubin Total: 0.3 mg/dL (ref 0.0–1.2)
Bilirubin, Direct: 0.05 mg/dL (ref 0.00–0.40)
Total Protein: 7.2 g/dL (ref 6.0–8.5)

## 2014-12-07 ENCOUNTER — Ambulatory Visit (INDEPENDENT_AMBULATORY_CARE_PROVIDER_SITE_OTHER): Payer: Medicare Other | Admitting: Pharmacist

## 2014-12-07 ENCOUNTER — Encounter: Payer: Self-pay | Admitting: Pharmacist

## 2014-12-07 VITALS — BP 128/76 | HR 77

## 2014-12-07 DIAGNOSIS — R7309 Other abnormal glucose: Secondary | ICD-10-CM | POA: Diagnosis not present

## 2014-12-07 DIAGNOSIS — R739 Hyperglycemia, unspecified: Secondary | ICD-10-CM

## 2014-12-07 DIAGNOSIS — E785 Hyperlipidemia, unspecified: Secondary | ICD-10-CM | POA: Diagnosis not present

## 2014-12-07 MED ORDER — ATORVASTATIN CALCIUM 10 MG PO TABS: 10.0000 mg | ORAL_TABLET | Freq: Every day | ORAL | Status: DC

## 2014-12-07 NOTE — Progress Notes (Signed)
Patient ID: TENLEY WINWARD, female   DOB: 1940-07-10, 74 y.o.   MRN: [id number] Subjective:    DESHAYLA EMPSON is a 74 y.o. female who presents for evaluation of dyslipidemia. The patient does use medications that may worsen dyslipidemias (corticosteroids, progestins, anabolic steroids, diuretics, beta-blockers, amiodarone, cyclosporine, olanzapine). Exercise: rarely. Previous history of cardiac disease includes: CABG.  Patient has history of statin intolerance.  She is currently taking atorvastatin 10mg  qod and toerating well.  Other stating tried:  livalo 2mg  12/2013 through 03/2014 - myalgias           Pravastatin 10mg  - 2012 - myalgias                      Rosuvastatin 5mg  2x/week - 08/2012 - myalgias           Also has tried Zetia and simvastatin but unable to locate and verify dates.  Patient has myalgias with these as well.   Cardiac Risk Factors Age > 45-female, > 55-female:  YES  +1  Smoking:   NO  Sig. family hx of CHD*:  YES  +1  Hypertension:   YES  +1  Diabetes:   NO  HDL < 35:   NO  HDL > 59:   NO  Total:  3   *Significant family history of CHD per NCEP = MI or sudden death at less than 13 year old in father or other 1st-degree female relative, or less than 54 year old in mother or  other 1st-degree female relative  The following portions of the patient's history were reviewed and updated as appropriate: allergies, current medications, past family history, past medical history, past social history, past surgical history and problem list.   Objective:   Lab Review Lab Results  Component Value Date   CHOL 262* 12/04/2014   CHOL 239* 05/30/2014   CHOL 308* 11/28/2013   TRIG 269* 12/04/2014   TRIG 196* 05/30/2014   TRIG 253* 11/28/2013   HDL 56 12/04/2014   HDL 54 05/30/2014   HDL 52 11/28/2013     Last LDL = 152 and LDL-P = 2292 (12/04/2014) Last FBG  = 110   Assessment:    Dyslipidemia as detailed above with history of antioplasty 1993 and very significant family  history (children) of CVD  Target levels for LDL are: < 70 mg/dl ("very high" risk for CHD)  Explained to the patient the respective contributions of genetics, diet, and exercise to lipid levels and the use of medication in severe cases which do not respond to lifestyle alteration. The patient's interest and motivation in making lifestyle changes seems good.     Plan:    The following changes are planned for the next 6 months, at which time the patient will return for repeat fasting lipids:  1. Dietary changes: Increase soluble fiber Reduce saturated fat, "trans" monounsaturated fatty acids, and cholesterol 2. Exercise changes:  Advised to engage in walking 30 to 40 minutes day with frequency goal of 3-4 times a week 3. Other treatment discussed elevated FBG and triglycerides - patient to also limit CHO's  4. Lipid-lowering medications :Increase  Atorvastatin from 10mg  QOD to QD  Discussed PSK9 which I think is a great option for patient.  About 8 months ago we explored this option and patient declined.  She again declines due to cost.  However she does agree that if when lipids are checked in 6 weeks if LDL has not improved significantly then she  will to a 6 weeks trial of Repatha (I have samples I can provide) 5.  RTC in 6 weeks to recheck lipids.  Also added A1c to labs drawn last week as FBG was elevated and Tg elevated to r/o DM / pre DM.  Note: The majority of the visit was spent in counseling on the pathophysiology and treatment of dyslipidemias. The total face-to-face time was in excess of 30 minutes.    Cherre Robins, PharmD, CPP, CDE

## 2014-12-09 LAB — SPECIMEN STATUS REPORT

## 2014-12-09 LAB — HGB A1C W/O EAG: Hgb A1c MFr Bld: 5.9 % — ABNORMAL HIGH (ref 4.8–5.6)

## 2014-12-13 ENCOUNTER — Telehealth: Payer: Self-pay | Admitting: Pharmacist

## 2014-12-13 MED ORDER — ATORVASTATIN CALCIUM 20 MG PO TABS: 20.0000 mg | ORAL_TABLET | Freq: Every day | ORAL | Status: DC

## 2014-12-13 NOTE — Telephone Encounter (Signed)
Patient would like to increase atorvastatin to 20mg  qd from 10mg  qd.  I think this is a good idea as long as she can tolerate. Rx sent to pharmacy.

## 2014-12-18 ENCOUNTER — Encounter: Payer: Self-pay | Admitting: Family Medicine

## 2014-12-18 ENCOUNTER — Ambulatory Visit (INDEPENDENT_AMBULATORY_CARE_PROVIDER_SITE_OTHER): Payer: Medicare Other | Admitting: Family Medicine

## 2014-12-18 VITALS — BP 169/85 | HR 74 | Temp 97.3°F | Ht 63.5 in | Wt 176.0 lb

## 2014-12-18 DIAGNOSIS — H9313 Tinnitus, bilateral: Secondary | ICD-10-CM | POA: Diagnosis not present

## 2014-12-18 DIAGNOSIS — Z8551 Personal history of malignant neoplasm of bladder: Secondary | ICD-10-CM | POA: Diagnosis not present

## 2014-12-18 NOTE — Progress Notes (Signed)
   Subjective:    Patient ID: Gabriela Smith, female    DOB: 1940-04-05, 74 y.o.   MRN: [id number]  HPI 74 year old female, who does not look her age, complains of five-day history of tinnitus. There is no hearing loss and no dizziness. She has had some congestion related to sinus or allergies past few days. Past history is significant for bladder cancer and hypertension.  Patient Active Problem List   Diagnosis Date Noted  . Statin intolerance 05/23/2013  . Osteopenia 04/20/2013  . Degenerative disc disease, lumbar 02/08/2013  . Vitamin D deficiency 02/08/2013  . Overweight (BMI 25.0-29.9) 11/24/2012  . S/P left TKA 11/22/2012  . History of bladder cancer 11/04/2012  . Osteoarthritis of left knee 11/04/2012  . Hyperlipidemia 11/28/2008  . Hypertension 11/28/2008  . CORONARY ATHEROSCLEROSIS NATIVE CORONARY ARTERY 11/28/2008   Outpatient Encounter Prescriptions as of 12/18/2014  Medication Sig  . aspirin EC 81 MG tablet Take 1 tablet (81 mg total) by mouth daily.  [name] atorvastatin (LIPITOR) 20 MG tablet Take 1 tablet (20 mg total) by mouth daily.  . calcium carbonate (TUMS - DOSED IN MG ELEMENTAL CALCIUM) 500 MG chewable tablet Chew 1 tablet by mouth as needed for indigestion or heartburn.  . cholecalciferol (VITAMIN D) 1000 UNITS tablet Take 3,000 Units by mouth daily.  [name] Co-Enzyme Q10 200 MG CAPS Take 1 capsule by mouth daily.  . Flaxseed, Linseed, (FLAX SEED OIL) 1000 MG CAPS Take 1 capsule by mouth daily.   . hydrochlorothiazide (HYDRODIURIL) 25 MG tablet Take 1 tablet (25 mg total) by mouth daily. (Patient taking differently: Take 25 mg by mouth every morning. )  . metoprolol tartrate (LOPRESSOR) 25 MG tablet TAKE (1) TABLET TWICE A DAY.  . [DISCONTINUED] clotrimazole (GYNE-LOTRIMIN 3) 2 % vaginal cream Place 1 Applicatorful vaginally at bedtime.  . [DISCONTINUED] metroNIDAZOLE (METROGEL VAGINAL) 0.75 % vaginal gel Place 1 Applicatorful vaginally 2 (two) times daily.  .  [DISCONTINUED] nystatin ointment (MYCOSTATIN) Apply 1 application topically 2 (two) times daily.   No facility-administered encounter medications on file as of 12/18/2014.      Review of Systems  Constitutional: Negative.   HENT: Positive for tinnitus.   Respiratory: Negative.   Cardiovascular: Negative.   Genitourinary: Negative.   Musculoskeletal: Negative.   Psychiatric/Behavioral: Negative.        Objective:   Physical Exam  Constitutional: She appears well-developed and well-nourished.  HENT:  B. She has normal Rinne and Weber tests however. oth tympanic membranes appear dull without normal light reflex. There is no tympanic membrane movement to Valsalva          Assessment & Plan:  1. Tinnitus, bilateral Assume sx ae related to serous effusion.  BP is also elevated and may be contributing. Flonase and ear exercises. Increase metoprolol to 50 mg BID.  If no better, ENT consultation  Wardell Honour MD

## 2014-12-21 ENCOUNTER — Other Ambulatory Visit: Payer: Self-pay

## 2014-12-21 MED ORDER — NITROGLYCERIN 0.4 MG SL SUBL: 0.4000 mg | SUBLINGUAL_TABLET | SUBLINGUAL | Status: DC | PRN

## 2014-12-21 NOTE — Telephone Encounter (Signed)
Last seen 12/18/14  Dr Sabra Heck this med not on [facility] list

## 2014-12-25 ENCOUNTER — Ambulatory Visit (INDEPENDENT_AMBULATORY_CARE_PROVIDER_SITE_OTHER): Payer: Medicare Other

## 2014-12-25 ENCOUNTER — Other Ambulatory Visit: Payer: Self-pay | Admitting: Family Medicine

## 2014-12-25 VITALS — BP 140/77 | HR 64

## 2014-12-25 DIAGNOSIS — Z23 Encounter for immunization: Secondary | ICD-10-CM

## 2015-01-15 ENCOUNTER — Other Ambulatory Visit: Payer: Self-pay

## 2015-01-22 ENCOUNTER — Other Ambulatory Visit: Payer: Medicare Other

## 2015-01-22 ENCOUNTER — Ambulatory Visit (INDEPENDENT_AMBULATORY_CARE_PROVIDER_SITE_OTHER): Payer: Medicare Other | Admitting: *Deleted

## 2015-01-22 VITALS — BP 134/76

## 2015-01-22 DIAGNOSIS — E785 Hyperlipidemia, unspecified: Secondary | ICD-10-CM

## 2015-01-22 DIAGNOSIS — I10 Essential (primary) hypertension: Secondary | ICD-10-CM

## 2015-01-22 NOTE — Progress Notes (Signed)
Pt here for BP check

## 2015-01-23 LAB — LIPID PANEL
Chol/HDL Ratio: 4.2 ratio units (ref 0.0–4.4)
Cholesterol, Total: 230 mg/dL — ABNORMAL HIGH (ref 100–199)
HDL: 55 mg/dL (ref >39)
LDL Calculated: 140 mg/dL — ABNORMAL HIGH (ref 0–99)
Triglycerides: 173 mg/dL — ABNORMAL HIGH (ref 0–149)
VLDL Cholesterol Cal: 35 mg/dL (ref 5–40)

## 2015-01-23 LAB — LDL CHOLESTEROL, DIRECT: LDL Direct: 158 mg/dL — ABNORMAL HIGH (ref 0–99)

## 2015-01-30 ENCOUNTER — Other Ambulatory Visit: Payer: Self-pay | Admitting: Family Medicine

## 2015-01-30 MED ORDER — METOPROLOL TARTRATE 50 MG PO TABS: 50.0000 mg | ORAL_TABLET | Freq: Two times a day (BID) | ORAL | Status: DC

## 2015-01-30 NOTE — Telephone Encounter (Signed)
Metoprolol dose was changed to 50 mg BID per Dr. Sabra Heck on 12-18-14.  New script was sent to pharmacy.

## 2015-02-05 ENCOUNTER — Other Ambulatory Visit: Payer: Self-pay | Admitting: Pharmacist

## 2015-02-05 DIAGNOSIS — E785 Hyperlipidemia, unspecified: Secondary | ICD-10-CM

## 2015-03-20 DIAGNOSIS — Z8551 Personal history of malignant neoplasm of bladder: Secondary | ICD-10-CM | POA: Diagnosis not present

## 2015-03-20 DIAGNOSIS — Z0000 Encounter for general adult medical examination without abnormal findings: Secondary | ICD-10-CM | POA: Diagnosis not present

## 2015-03-26 ENCOUNTER — Other Ambulatory Visit (INDEPENDENT_AMBULATORY_CARE_PROVIDER_SITE_OTHER): Payer: Medicare Other

## 2015-03-26 DIAGNOSIS — E785 Hyperlipidemia, unspecified: Secondary | ICD-10-CM | POA: Diagnosis not present

## 2015-03-27 LAB — HEPATIC FUNCTION PANEL
ALT: 17 IU/L (ref 0–32)
AST: 21 IU/L (ref 0–40)
Albumin: 4.3 g/dL (ref 3.5–4.8)
Alkaline Phosphatase: 75 IU/L (ref 39–117)
Bilirubin Total: 0.3 mg/dL (ref 0.0–1.2)
Bilirubin, Direct: 0.08 mg/dL (ref 0.00–0.40)
Total Protein: 7 g/dL (ref 6.0–8.5)

## 2015-03-27 LAB — LIPID PANEL
Chol/HDL Ratio: 4.3 ratio units (ref 0.0–4.4)
Cholesterol, Total: 255 mg/dL — ABNORMAL HIGH (ref 100–199)
HDL: 59 mg/dL (ref >39)
LDL Calculated: 149 mg/dL — ABNORMAL HIGH (ref 0–99)
Triglycerides: 237 mg/dL — ABNORMAL HIGH (ref 0–149)
VLDL Cholesterol Cal: 47 mg/dL — ABNORMAL HIGH (ref 5–40)

## 2015-03-27 LAB — LDL CHOLESTEROL, DIRECT: LDL Direct: 158 mg/dL — ABNORMAL HIGH (ref 0–99)

## 2015-03-30 DIAGNOSIS — H25013 Cortical age-related cataract, bilateral: Secondary | ICD-10-CM | POA: Diagnosis not present

## 2015-03-30 DIAGNOSIS — H2513 Age-related nuclear cataract, bilateral: Secondary | ICD-10-CM | POA: Diagnosis not present

## 2015-04-04 ENCOUNTER — Telehealth: Payer: Self-pay | Admitting: Pharmacist

## 2015-04-04 MED ORDER — EZETIMIBE 10 MG PO TABS: 10.0000 mg | ORAL_TABLET | Freq: Every day | ORAL | Status: DC

## 2015-04-04 NOTE — Telephone Encounter (Signed)
LDL has not improved - triglycerides higher.  Recommend add Zetia 10mg  one tablet daily (can now get generic) to atorvastatin 20mg  daily Recheck lipids in 6 to 8 weeks.  I discussed with patient and discovered she has only been able to tolerate taking atorvastatin 20mg  every other day.  She is agreeable to taking generic zetia as well as long as it is not expensive. Rx sent to [facility].

## 2015-04-12 ENCOUNTER — Encounter: Payer: Self-pay | Admitting: *Deleted

## 2015-06-05 ENCOUNTER — Ambulatory Visit: Payer: Medicare Other | Admitting: Family Medicine

## 2015-06-07 ENCOUNTER — Ambulatory Visit: Payer: Medicare Other | Admitting: Family Medicine

## 2015-06-11 DIAGNOSIS — Z08 Encounter for follow-up examination after completed treatment for malignant neoplasm: Secondary | ICD-10-CM | POA: Diagnosis not present

## 2015-06-11 DIAGNOSIS — X32XXXD Exposure to sunlight, subsequent encounter: Secondary | ICD-10-CM | POA: Diagnosis not present

## 2015-06-11 DIAGNOSIS — Z1283 Encounter for screening for malignant neoplasm of skin: Secondary | ICD-10-CM | POA: Diagnosis not present

## 2015-06-11 DIAGNOSIS — L570 Actinic keratosis: Secondary | ICD-10-CM | POA: Diagnosis not present

## 2015-06-11 DIAGNOSIS — C44619 Basal cell carcinoma of skin of left upper limb, including shoulder: Secondary | ICD-10-CM | POA: Diagnosis not present

## 2015-06-11 DIAGNOSIS — B079 Viral wart, unspecified: Secondary | ICD-10-CM | POA: Diagnosis not present

## 2015-06-11 DIAGNOSIS — Z85828 Personal history of other malignant neoplasm of skin: Secondary | ICD-10-CM | POA: Diagnosis not present

## 2015-06-13 ENCOUNTER — Other Ambulatory Visit: Payer: Self-pay | Admitting: Pharmacist

## 2015-06-18 ENCOUNTER — Telehealth: Payer: Self-pay | Admitting: Family Medicine

## 2015-06-18 DIAGNOSIS — Z8551 Personal history of malignant neoplasm of bladder: Secondary | ICD-10-CM | POA: Diagnosis not present

## 2015-06-18 DIAGNOSIS — Z0000 Encounter for general adult medical examination without abnormal findings: Secondary | ICD-10-CM | POA: Diagnosis not present

## 2015-06-18 NOTE — Telephone Encounter (Signed)
Called to speak with pt and she states she realized she had an appt with Dr. Laurance Flatten next week and would just wait until then. Advised pt to CB if she changes her mind.

## 2015-06-20 ENCOUNTER — Other Ambulatory Visit: Payer: Self-pay | Admitting: Family Medicine

## 2015-06-27 ENCOUNTER — Ambulatory Visit: Payer: Medicare Other | Admitting: Family Medicine

## 2015-06-28 ENCOUNTER — Encounter (INDEPENDENT_AMBULATORY_CARE_PROVIDER_SITE_OTHER): Payer: Self-pay

## 2015-06-28 ENCOUNTER — Encounter: Payer: Self-pay | Admitting: Family Medicine

## 2015-06-28 ENCOUNTER — Ambulatory Visit (INDEPENDENT_AMBULATORY_CARE_PROVIDER_SITE_OTHER): Payer: Medicare Other | Admitting: Family Medicine

## 2015-06-28 ENCOUNTER — Ambulatory Visit: Payer: Medicare Other

## 2015-06-28 VITALS — BP 133/70 | HR 67 | Temp 97.8°F | Ht 63.5 in | Wt 180.0 lb

## 2015-06-28 DIAGNOSIS — Z13820 Encounter for screening for osteoporosis: Secondary | ICD-10-CM | POA: Diagnosis not present

## 2015-06-28 DIAGNOSIS — I2510 Atherosclerotic heart disease of native coronary artery without angina pectoris: Secondary | ICD-10-CM

## 2015-06-28 DIAGNOSIS — I10 Essential (primary) hypertension: Secondary | ICD-10-CM

## 2015-06-28 DIAGNOSIS — H6982 Other specified disorders of Eustachian tube, left ear: Secondary | ICD-10-CM | POA: Diagnosis not present

## 2015-06-28 DIAGNOSIS — E559 Vitamin D deficiency, unspecified: Secondary | ICD-10-CM

## 2015-06-28 DIAGNOSIS — E785 Hyperlipidemia, unspecified: Secondary | ICD-10-CM

## 2015-06-28 DIAGNOSIS — Z780 Asymptomatic menopausal state: Secondary | ICD-10-CM

## 2015-06-28 DIAGNOSIS — N898 Other specified noninflammatory disorders of vagina: Secondary | ICD-10-CM | POA: Diagnosis not present

## 2015-06-28 LAB — WET PREP FOR TRICH, YEAST, CLUE
Clue Cell Exam: NEGATIVE
Trichomonas Exam: NEGATIVE
Yeast Exam: NEGATIVE

## 2015-06-28 MED ORDER — AZELASTINE HCL 0.1 % NA SOLN: 1.0000 | Freq: Two times a day (BID) | NASAL | Status: DC

## 2015-06-28 MED ORDER — NYSTATIN 100000 UNIT/GM EX OINT: 1.0000 "application " | TOPICAL_OINTMENT | Freq: Two times a day (BID) | CUTANEOUS | Status: DC

## 2015-06-28 MED ORDER — SUVOREXANT 10 MG PO TABS: 1.0000 | ORAL_TABLET | Freq: Every evening | ORAL | Status: DC | PRN

## 2015-06-28 NOTE — Patient Instructions (Addendum)
Medicare Annual Wellness Visit  [facility] and the medical providers at [facility] strive to bring you the best medical care.  In doing so we not only want to address your current medical conditions and concerns but also to detect new conditions early and prevent illness, disease and health-related problems.    Medicare offers a yearly Wellness Visit which allows our clinical staff to assess your need for preventative services including immunizations, lifestyle education, counseling to decrease risk of preventable diseases and screening for fall risk and other medical concerns.    This visit is provided free of charge (no copay) for all Medicare recipients. The clinical pharmacists at [facility] have begun to conduct these Wellness Visits which will also include a thorough review of all your medications.    As you primary medical provider recommend that you make an appointment for your Annual Wellness Visit if you have not done so already this year.  You may set up this appointment before you leave today or you may call back [phone] and schedule an appointment.  Please make sure when you call that you mention that you are scheduling your Annual Wellness Visit with the clinical pharmacist so that the appointment may be made for the proper length of time.     Continue current medications. Continue good therapeutic lifestyle changes which include good diet and exercise. Fall precautions discussed with patient. If an FOBT was given today- please return it to our front desk. If you are over 62 years old - you may need Prevnar 73 or the adult Pneumonia vaccine.  **Flu shots are available--- please call and schedule a FLU-CLINIC appointment**  After your visit with [place] today you will receive a survey in the mail or online from [employer] regarding your care with [place]. Please take a moment to fill this out. Your feedback is very  important to [place] as you can help [place] better understand your patient needs as well as improve your experience and satisfaction. WE CARE ABOUT YOU!!!   Continue to use Flonase 2 sprays each nostril at bedtime Discontinue Benadryl Start Astelin nasal spray 1 spray each nostril at bedtime and use nightly for 10-14 days or until the tinnitus or ringing goes away Start Belsomra for sleep 10 mg 1 daily at bedtime and after 4 or 5 days if no improvement increased to 2 daily at bedtime Take Zantac or ranitidine as needed for indigestion We will call with the vaginal wet prep information is soon as that information is available We will call with results of the lab work as soon as that information becomes available Continue to follow-up with urology We will schedule you for an appointment with cardiology for routine follow-up and because of your history of heart disease.  Try Dove sensitive skin soap and [employer] Toilet paper

## 2015-06-28 NOTE — Progress Notes (Signed)
Subjective:    Patient ID: Gabriela Smith, female    DOB: 1940/09/21, 75 y.o.   MRN: [id number]  HPI Pt here for follow up and management of chronic medical problems which includes hypertension and hyperlipidemia. She is taking medications regularly.The patient today complains of some tinnitus in both the ears and some vaginal itching and burning. She is currently using nystatin ointment for this. She is due to get a DEXA scan. She is also due to get lab work. She gets her mammogram done every 2 years. She is followed regularly by Dr. Karsten Ro for her bladder cancer.She sees him now every 3 months and if she keeps clear of cancer she will go to every 6 months. The patient denies any chest pain or shortness of breath. She has not seen the cardiologist in several years. She does have a history of coronary atherosclerosis. She denies any trouble with blood in the stool or black tarry bowel movements. She does have some indigestion. She continues to follow-up with the urologist. She does have this vaginal itching and burning that is there whether she is passing her water or not. She says she does not feel like it is a yeast infection. We will get a wet prep before we prescribe any treatment.    Patient Active Problem List   Diagnosis Date Noted  . Statin intolerance 05/23/2013  . Osteopenia 04/20/2013  . Degenerative disc disease, lumbar 02/08/2013  . Vitamin D deficiency 02/08/2013  . Overweight (BMI 25.0-29.9) 11/24/2012  . S/P left TKA 11/22/2012  . History of bladder cancer 11/04/2012  . Osteoarthritis of left knee 11/04/2012  . Hyperlipidemia 11/28/2008  . Hypertension 11/28/2008  . CORONARY ATHEROSCLEROSIS NATIVE CORONARY ARTERY 11/28/2008   Outpatient Encounter Prescriptions as of 06/28/2015  Medication Sig  . atorvastatin (LIPITOR) 20 MG tablet Take 1 tablet (20 mg total) by mouth daily.  . calcium carbonate (TUMS - DOSED IN MG ELEMENTAL CALCIUM) 500 MG chewable tablet Chew 1 tablet by  mouth as needed for indigestion or heartburn.  . cholecalciferol (VITAMIN D) 1000 UNITS tablet Take 3,000 Units by mouth daily.  [name] Co-Enzyme Q10 200 MG CAPS Take 1 capsule by mouth daily.  . Cyanocobalamin (VITAMIN B 12 PO) Take by mouth.  . Flaxseed, Linseed, (FLAX SEED OIL) 1000 MG CAPS Take 1 capsule by mouth daily.   . hydrochlorothiazide (MICROZIDE) 12.5 MG capsule TAKE (1) CAPSULE DAILY  . metoprolol (LOPRESSOR) 50 MG tablet Take 1 tablet (50 mg total) by mouth 2 (two) times daily.  . nitroGLYCERIN (NITROSTAT) 0.4 MG SL tablet Place 1 tablet (0.4 mg total) under the tongue every 5 (five) minutes as needed for chest pain. (Patient not taking: Reported on 06/28/2015)  . [DISCONTINUED] aspirin EC 81 MG tablet Take 1 tablet (81 mg total) by mouth daily.  . [DISCONTINUED] ezetimibe (ZETIA) 10 MG tablet Take 1 tablet (10 mg total) by mouth daily.   No facility-administered encounter medications on file as of 06/28/2015.      Review of Systems  Constitutional: Negative.   HENT: Negative.        Ringing in ears  Eyes: Negative.   Respiratory: Negative.   Cardiovascular: Negative.   Gastrointestinal: Negative.   Endocrine: Negative.   Genitourinary: Negative.        Vaginal irritation  Musculoskeletal: Negative.   Skin: Negative.   Allergic/Immunologic: Negative.   Neurological: Negative.   Hematological: Negative.   Psychiatric/Behavioral: Negative.        Objective:  Physical Exam  Constitutional: She is oriented to person, place, and time. She appears well-developed and well-nourished. No distress.  HENT:  Head: Normocephalic and atraumatic.  Right Ear: External ear normal.  Mouth/Throat: Oropharynx is clear and moist.  The left TM is slightly full. There is nasal congestion bilaterally left greater than right  Eyes: Conjunctivae and EOM are normal. Pupils are equal, round, and reactive to light. Right eye exhibits no discharge. Left eye exhibits no discharge. No scleral  icterus.  Neck: Normal range of motion. Neck supple. No thyromegaly present.  No bruits or anterior cervical adenopathy  Cardiovascular: Normal rate, regular rhythm and intact distal pulses.   No murmur heard. The heart has a regular rate and rhythm at 72/m  Pulmonary/Chest: Effort normal and breath sounds normal. No respiratory distress. She has no wheezes. She has no rales. She exhibits no tenderness.  Clear anteriorly and posteriorly  Abdominal: Soft. Bowel sounds are normal. She exhibits no mass. There is no tenderness. There is no rebound and no guarding.  No abdominal tenderness or bruits or organ enlargement  Musculoskeletal: Normal range of motion. She exhibits no edema.  Lymphadenopathy:    She has no cervical adenopathy.  Neurological: She is alert and oriented to person, place, and time. She has normal reflexes. No cranial nerve deficit.  Skin: Skin is warm and dry. No rash noted.  Dry skin in general  Psychiatric: She has a normal mood and affect. Her behavior is normal. Judgment and thought content normal.  Nursing note and vitals reviewed.  BP 133/70 mmHg  Pulse 67  Temp(Src) 97.8 F (36.6 C) (Oral)  Ht 5' 3.5" (1.613 m)  Wt 180 lb (81.647 kg)  BMI 31.38 kg/m2        Assessment & Plan:  1. Hypertension -The blood pressure is good today and she will continue with current - BMP8+EGFR - CBC with Differential/Platelet - Hepatic function panel - Ambulatory referral to Cardiology  2. Hyperlipidemia -Continue current treatment pending results of lab work and still consider this patient for a PCS K-9 inhibitor treatment - Lipid panel - CBC with Differential/Platelet - Ambulatory referral to Cardiology  3. Vitamin D deficiency -Continue current treatment pending results of lab work - CBC with Differential/Platelet - VITAMIN D 25 Hydroxy (Vit-D Deficiency, Fractures) - DG Bone Density; Future  4. ASCVD (arteriosclerotic cardiovascular disease) -Follow-up with  cardiology as she has not seen a cardiologist in several years. - Lipid panel - CBC with Differential/Platelet - Ambulatory referral to Cardiology  5. Hyperlipidemia LDL goal <70 -Continue with atorvastatin pending results of lab work  6. Screening for osteoporosis -DEXA scan today - DG Bone Density; Future  7. Postmenopausal -Continue calcium and vitamin D and weightbearing exercise - DG Bone Density; Future  8. Vaginal irritation -Treatment pending results of wet prep - WET PREP FOR TRICH, YEAST, CLUE  9. Eustachian tube dysfunction, left -Use nasal saline, use Astelin nasal spray 1 spray each nostril at bedtime and continue to use Flonase 2 sprays each nostril at bedtime. Only use the Astelin for a couple weeks then discontinue this  Meds ordered this encounter  Medications  . Cyanocobalamin (VITAMIN B 12 PO)    Sig: Take by mouth.  . nystatin ointment (MYCOSTATIN)    Sig: Apply 1 application topically 2 (two) times daily.    Dispense:  30 g    Refill:  3  . azelastine (ASTELIN) 0.1 % nasal spray    Sig: Place 1 spray into both  nostrils 2 (two) times daily. Use in each nostril as directed    Dispense:  30 mL    Refill:  12  . Suvorexant (BELSOMRA) 10 MG TABS    Sig: Take 1-2 tablets by mouth at bedtime as needed.    Dispense:  30 tablet    Refill:  0   Patient Instructions                       Medicare Annual Wellness Visit  [facility] and the medical providers at [facility] strive to bring you the best medical care.  In doing so we not only want to address your current medical conditions and concerns but also to detect new conditions early and prevent illness, disease and health-related problems.    Medicare offers a yearly Wellness Visit which allows our clinical staff to assess your need for preventative services including immunizations, lifestyle education, counseling to decrease risk of preventable diseases and screening for fall risk and  other medical concerns.    This visit is provided free of charge (no copay) for all Medicare recipients. The clinical pharmacists at [facility] have begun to conduct these Wellness Visits which will also include a thorough review of all your medications.    As you primary medical provider recommend that you make an appointment for your Annual Wellness Visit if you have not done so already this year.  You may set up this appointment before you leave today or you may call back ([phone]) and schedule an appointment.  Please make sure when you call that you mention that you are scheduling your Annual Wellness Visit with the clinical pharmacist so that the appointment may be made for the proper length of time.     Continue current medications. Continue good therapeutic lifestyle changes which include good diet and exercise. Fall precautions discussed with patient. If an FOBT was given today- please return it to our front desk. If you are over 26 years old - you may need Prevnar 29 or the adult Pneumonia vaccine.  **Flu shots are available--- please call and schedule a FLU-CLINIC appointment**  After your visit with [place] today you will receive a survey in the mail or online from [employer] regarding your care with [place]. Please take a moment to fill this out. Your feedback is very important to [place] as you can help [place] better understand your patient needs as well as improve your experience and satisfaction. WE CARE ABOUT YOU!!!   Continue to use Flonase 2 sprays each nostril at bedtime Discontinue Benadryl Start Astelin nasal spray 1 spray each nostril at bedtime and use nightly for 10-14 days or until the tinnitus or ringing goes away Start Belsomra for sleep 10 mg 1 daily at bedtime and after 4 or 5 days if no improvement increased to 2 daily at bedtime Take Zantac or ranitidine as needed for indigestion We will call with the vaginal wet prep information is soon as that information  is available We will call with results of the lab work as soon as that information becomes available Continue to follow-up with urology We will schedule you for an appointment with cardiology for routine follow-up and because of your history of heart disease.  Try Dove sensitive skin soap and [employer] Toilet paper   Arrie Senate MD

## 2015-06-29 ENCOUNTER — Other Ambulatory Visit: Payer: Self-pay | Admitting: *Deleted

## 2015-06-29 LAB — CBC WITH DIFFERENTIAL/PLATELET
Basophils Absolute: 0 10*3/uL (ref 0.0–0.2)
Basos: 0 %
EOS (ABSOLUTE): 0.1 10*3/uL (ref 0.0–0.4)
Eos: 2 %
Hematocrit: 37.1 % (ref 34.0–46.6)
Hemoglobin: 12.3 g/dL (ref 11.1–15.9)
Immature Grans (Abs): 0 10*3/uL (ref 0.0–0.1)
Immature Granulocytes: 0 %
Lymphocytes Absolute: 2.6 10*3/uL (ref 0.7–3.1)
Lymphs: 41 %
MCH: 31.4 pg (ref 26.6–33.0)
MCHC: 33.2 g/dL (ref 31.5–35.7)
MCV: 95 fL (ref 79–97)
Monocytes Absolute: 0.6 10*3/uL (ref 0.1–0.9)
Monocytes: 9 %
Neutrophils Absolute: 3.2 10*3/uL (ref 1.4–7.0)
Neutrophils: 48 %
Platelets: 235 10*3/uL (ref 150–379)
RBC: 3.92 x10E6/uL (ref 3.77–5.28)
RDW: 14.1 % (ref 12.3–15.4)
WBC: 6.5 10*3/uL (ref 3.4–10.8)

## 2015-06-29 LAB — HEPATIC FUNCTION PANEL
ALT: 16 IU/L (ref 0–32)
AST: 17 IU/L (ref 0–40)
Albumin: 4.6 g/dL (ref 3.5–4.8)
Alkaline Phosphatase: 77 IU/L (ref 39–117)
Bilirubin Total: 0.4 mg/dL (ref 0.0–1.2)
Bilirubin, Direct: 0.1 mg/dL (ref 0.00–0.40)
Total Protein: 7.3 g/dL (ref 6.0–8.5)

## 2015-06-29 LAB — BMP8+EGFR
BUN/Creatinine Ratio: 19 (ref 12–28)
BUN: 15 mg/dL (ref 8–27)
CO2: 26 mmol/L (ref 18–29)
Calcium: 9.7 mg/dL (ref 8.7–10.3)
Chloride: 99 mmol/L (ref 96–106)
Creatinine, Ser: 0.81 mg/dL (ref 0.57–1.00)
GFR calc Af Amer: 82 mL/min/{1.73_m2} (ref >59)
GFR calc non Af Amer: 71 mL/min/{1.73_m2} (ref >59)
Glucose: 86 mg/dL (ref 65–99)
Potassium: 4.7 mmol/L (ref 3.5–5.2)
Sodium: 141 mmol/L (ref 134–144)

## 2015-06-29 LAB — LIPID PANEL
Chol/HDL Ratio: 3.4 ratio units (ref 0.0–4.4)
Cholesterol, Total: 213 mg/dL — ABNORMAL HIGH (ref 100–199)
HDL: 62 mg/dL (ref >39)
LDL Calculated: 116 mg/dL — ABNORMAL HIGH (ref 0–99)
Triglycerides: 173 mg/dL — ABNORMAL HIGH (ref 0–149)
VLDL Cholesterol Cal: 35 mg/dL (ref 5–40)

## 2015-06-29 LAB — VITAMIN D 25 HYDROXY (VIT D DEFICIENCY, FRACTURES): Vit D, 25-Hydroxy: 39.4 ng/mL (ref 30.0–100.0)

## 2015-06-29 MED ORDER — METRONIDAZOLE 0.75 % VA GEL: 1.0000 | Freq: Two times a day (BID) | VAGINAL | Status: DC

## 2015-07-23 ENCOUNTER — Other Ambulatory Visit: Payer: Self-pay | Admitting: Family Medicine

## 2015-08-01 ENCOUNTER — Encounter: Payer: Self-pay | Admitting: Family Medicine

## 2015-08-01 ENCOUNTER — Ambulatory Visit (INDEPENDENT_AMBULATORY_CARE_PROVIDER_SITE_OTHER): Payer: Medicare Other | Admitting: Family Medicine

## 2015-08-01 VITALS — BP 149/71 | HR 93 | Temp 97.6°F | Ht 63.5 in | Wt 180.0 lb

## 2015-08-01 DIAGNOSIS — N760 Acute vaginitis: Secondary | ICD-10-CM | POA: Diagnosis not present

## 2015-08-01 DIAGNOSIS — C679 Malignant neoplasm of bladder, unspecified: Secondary | ICD-10-CM | POA: Diagnosis not present

## 2015-08-01 DIAGNOSIS — I2510 Atherosclerotic heart disease of native coronary artery without angina pectoris: Secondary | ICD-10-CM | POA: Diagnosis not present

## 2015-08-01 DIAGNOSIS — R300 Dysuria: Secondary | ICD-10-CM

## 2015-08-01 DIAGNOSIS — I10 Essential (primary) hypertension: Secondary | ICD-10-CM

## 2015-08-01 DIAGNOSIS — N898 Other specified noninflammatory disorders of vagina: Secondary | ICD-10-CM

## 2015-08-01 DIAGNOSIS — L298 Other pruritus: Secondary | ICD-10-CM

## 2015-08-01 DIAGNOSIS — M545 Low back pain: Secondary | ICD-10-CM | POA: Diagnosis not present

## 2015-08-01 LAB — MICROSCOPIC EXAMINATION

## 2015-08-01 LAB — URINALYSIS, COMPLETE
Bilirubin, UA: NEGATIVE
Glucose, UA: NEGATIVE
Ketones, UA: NEGATIVE
Nitrite, UA: NEGATIVE
Protein, UA: NEGATIVE
RBC, UA: NEGATIVE
Specific Gravity, UA: 1.015 (ref 1.005–1.030)
Urobilinogen, Ur: 0.2 mg/dL (ref 0.2–1.0)
pH, UA: 7 (ref 5.0–7.5)

## 2015-08-01 MED ORDER — CLOTRIMAZOLE-BETAMETHASONE 1-0.05 % EX CREA: 1.0000 "application " | TOPICAL_CREAM | Freq: Two times a day (BID) | CUTANEOUS | Status: DC

## 2015-08-01 NOTE — Progress Notes (Signed)
Subjective:    Patient ID: Gabriela Smith, female    DOB: [birth year], 75 y.o.   MRN: [id number]  HPI Patient here today for low back pain, vaginal itching and dysuria. The patient has had this vaginal burning and itching for a good while and has been on treatments for yeast as well as metronidazole. She still continues to have problems with this. She does not have any discharge. Her low back does hurt some recently and she does have a husband at home that she has to do more for because he has Parkinson's disease. She's had some urinary frequency. Her home blood pressures have been running in the 140 over the 70-80 range. She has had no gastrointestinal issues associated with this. He has no suprapubic pressure.    Patient Active Problem List   Diagnosis Date Noted  . Statin intolerance 05/23/2013  . Osteopenia 04/20/2013  . Degenerative disc disease, lumbar 02/08/2013  . Vitamin D deficiency 02/08/2013  . Overweight (BMI 25.0-29.9) 11/24/2012  . S/P left TKA 11/22/2012  . Bladder cancer ([place]) 11/04/2012  . Osteoarthritis of left knee 11/04/2012  . Hyperlipidemia LDL goal <70 11/28/2008  . Hypertension 11/28/2008  . CORONARY ATHEROSCLEROSIS NATIVE CORONARY ARTERY 11/28/2008   Outpatient Encounter Prescriptions as of 08/01/2015  Medication Sig  . atorvastatin (LIPITOR) 20 MG tablet Take 1 tablet (20 mg total) by mouth daily.  . calcium carbonate (TUMS - DOSED IN MG ELEMENTAL CALCIUM) 500 MG chewable tablet Chew 1 tablet by mouth as needed for indigestion or heartburn.  . cholecalciferol (VITAMIN D) 1000 UNITS tablet Take 3,000 Units by mouth daily.  [name] Co-Enzyme Q10 200 MG CAPS Take 1 capsule by mouth daily.  . Cyanocobalamin (VITAMIN B 12 PO) Take by mouth.  . Flaxseed, Linseed, (FLAX SEED OIL) 1000 MG CAPS Take 1 capsule by mouth daily.   . hydrochlorothiazide (MICROZIDE) 12.5 MG capsule TAKE (1) CAPSULE DAILY  . metoprolol (LOPRESSOR) 50 MG tablet TAKE  (1)  TABLET TWICE A DAY.  .  metroNIDAZOLE (METROGEL VAGINAL) 0.75 % vaginal gel Place 1 Applicatorful vaginally 2 (two) times daily.  . nitroGLYCERIN (NITROSTAT) 0.4 MG SL tablet Place 1 tablet (0.4 mg total) under the tongue every 5 (five) minutes as needed for chest pain.  [name] nystatin ointment (MYCOSTATIN) Apply 1 application topically 2 (two) times daily.  . Suvorexant (BELSOMRA) 10 MG TABS Take 1-2 tablets by mouth at bedtime as needed. (Patient not taking: Reported on 08/01/2015)  . [DISCONTINUED] azelastine (ASTELIN) 0.1 % nasal spray Place 1 spray into both nostrils 2 (two) times daily. Use in each nostril as directed   No facility-administered encounter medications on file as of 08/01/2015.      Review of Systems  Constitutional: Negative.   HENT: Negative.   Eyes: Negative.   Respiratory: Negative.   Cardiovascular: Negative.   Gastrointestinal: Negative.   Endocrine: Negative.   Genitourinary: Positive for dysuria.       Vaginal itching and irritation  Musculoskeletal: Positive for back pain (low).  Skin: Negative.   Allergic/Immunologic: Negative.   Neurological: Negative.   Hematological: Negative.   Psychiatric/Behavioral: Negative.        Objective:   Physical Exam  Constitutional: She is oriented to person, place, and time. She appears well-developed and well-nourished.  Pleasant and alert  HENT:  Head: Normocephalic and atraumatic.  Eyes: Conjunctivae and EOM are normal. Pupils are equal, round, and reactive to light. Right eye exhibits no discharge. Left eye exhibits no discharge. No  scleral icterus.  Neck: Normal range of motion.  Abdominal: Soft. Bowel sounds are normal. She exhibits no mass. There is no tenderness. There is no rebound and no guarding.  The flank areas bilaterally were not tender to palpation.  Musculoskeletal: Normal range of motion. She exhibits no edema.  The lumbar spine is somewhat tender to palpation.  Neurological: She is alert and oriented to person, place, and  time.  Skin: Skin is warm and dry. No rash noted.  Psychiatric: She has a normal mood and affect. Her behavior is normal. Judgment and thought content normal.  Nursing note and vitals reviewed.  BP 149/71 mmHg  Pulse 93  Temp(Src) 97.6 F (36.4 C) (Oral)  Ht 5' 3.5" (1.613 m)  Wt 180 lb (81.647 kg)  BMI 31.38 kg/m2  A pelvic exam done by the mid-level indicates that she saw basically external vaginal redness and irritation and there was no discharge.      Assessment & Plan:  1. Low back pain, unspecified back pain laterality, with sciatica presence unspecified -Before treating the urinalysis we will wait for culture and sensitivity be returned. - Urinalysis, Complete - Urine culture  2. Vagina itching -Lotrisone cream to apply topically and sparingly twice daily to labia majora and minora. - Urinalysis, Complete - Urine culture - WET PREP FOR TRICH, YEAST, CLUE  3. Dysuria -Drink plenty of fluids and await urine culture result - Urinalysis, Complete - Urine culture  4. Malignant neoplasm of urinary bladder, unspecified site [facility]) -Continue to follow-up with urology  5. Atherosclerosis of native coronary artery of native heart without angina pectoris -Continue aggressive therapeutic lifestyle change -continue to monitor blood pressure readings at home and no change in treatment today t  6. Hypertension -Continue to monitor blood pressure at home and no added salt  7. Vaginitis -Lotrisone cream as directed twice daily for 1 week and call about response at that time  Meds ordered this encounter  Medications  . clotrimazole-betamethasone (LOTRISONE) cream    Sig: Apply 1 application topically 2 (two) times daily. Use sparingly BID    Dispense:  30 g    Refill:  1   Arrie Senate MD

## 2015-08-01 NOTE — Patient Instructions (Signed)
Use Lotrisone cream sparingly twice daily to genitalia as directed Call in one week for progress Continue to use soaps for sensitive skin and detergents for sensitive skin Wear loosefitting clothes and rinse self thoroughly with water after taking shower

## 2015-08-02 ENCOUNTER — Telehealth: Payer: Self-pay | Admitting: *Deleted

## 2015-08-02 NOTE — Telephone Encounter (Signed)
Patient wants to know if we can call in a rx for Uribel capsules to help with the pain from frequent urination and back pain?

## 2015-08-02 NOTE — Telephone Encounter (Signed)
I am not familiar with this medicine and could not find it in my PDR

## 2015-08-02 NOTE — Telephone Encounter (Signed)
Is their something that you can give to take it's place like pyridium

## 2015-08-03 LAB — URINE CULTURE

## 2015-08-03 MED ORDER — CIPROFLOXACIN HCL 500 MG PO TABS: 500.0000 mg | ORAL_TABLET | Freq: Two times a day (BID) | ORAL | Status: DC

## 2015-08-03 NOTE — Telephone Encounter (Signed)
Patient aware of recommendations.  

## 2015-08-03 NOTE — Addendum Note (Signed)
Addended by: Wardell Heath on: 08/03/2015 11:55 AM   Modules accepted: Orders

## 2015-08-03 NOTE — Telephone Encounter (Signed)
Only meds unknown are Uristat or Azo pain relief maximum strength

## 2015-08-23 ENCOUNTER — Encounter: Payer: Self-pay | Admitting: Family Medicine

## 2015-08-23 ENCOUNTER — Ambulatory Visit (INDEPENDENT_AMBULATORY_CARE_PROVIDER_SITE_OTHER): Payer: Medicare Other | Admitting: Family Medicine

## 2015-08-23 ENCOUNTER — Ambulatory Visit (INDEPENDENT_AMBULATORY_CARE_PROVIDER_SITE_OTHER): Payer: Medicare Other

## 2015-08-23 VITALS — BP 131/66 | HR 68 | Temp 97.7°F | Ht 63.5 in | Wt 177.0 lb

## 2015-08-23 DIAGNOSIS — R350 Frequency of micturition: Secondary | ICD-10-CM

## 2015-08-23 DIAGNOSIS — M545 Low back pain: Secondary | ICD-10-CM

## 2015-08-23 DIAGNOSIS — C679 Malignant neoplasm of bladder, unspecified: Secondary | ICD-10-CM | POA: Diagnosis not present

## 2015-08-23 DIAGNOSIS — I10 Essential (primary) hypertension: Secondary | ICD-10-CM

## 2015-08-23 LAB — MICROSCOPIC EXAMINATION

## 2015-08-23 LAB — URINALYSIS, COMPLETE
Bilirubin, UA: NEGATIVE
Glucose, UA: NEGATIVE
Ketones, UA: NEGATIVE
Nitrite, UA: NEGATIVE
Protein, UA: NEGATIVE
RBC, UA: NEGATIVE
Specific Gravity, UA: 1.01 (ref 1.005–1.030)
Urobilinogen, Ur: 0.2 mg/dL (ref 0.2–1.0)
pH, UA: 7 (ref 5.0–7.5)

## 2015-08-23 MED ORDER — CIPROFLOXACIN HCL 500 MG PO TABS: 500.0000 mg | ORAL_TABLET | Freq: Two times a day (BID) | ORAL | Status: DC

## 2015-08-23 NOTE — Patient Instructions (Signed)
Drink more fluids Take antibiotic regularly until completed and recheck urinalysis, clean catch midstream when antibiotic is completed We will call with culture and sensitivity results and if we have to change antibiotic we will let you know and we can call the antibiotic in for you once the culture and sensitivity is returned if necessary. We will also call with the results of the LS spine films. Once again drink plenty of fluids

## 2015-08-23 NOTE — Progress Notes (Signed)
Subjective:    Patient ID: Gabriela Smith, female    DOB: 08/28/1940, 75 y.o.   MRN: [id number]  HPI Patient here today for low back pain and urine frequency. The patient is having similar symptoms to what she had previously with the urinary tract infection and being treated with Cipro. She did get better. The symptoms returned. The appropriate antibiotic was used for the bacteria that was growing on the culture. The patient indicates that she does not drink a lot of water and fluids and she is going to try to do better with this. She has an appointment with the urologist because of her bladder cancer the end of July. She does say that it hurts with certain positions just to the right of the lower lumbar spine area and is tender to palpation. She had trouble laying down on the table because of the pain in her back. She is also having frequency and urgency.     Patient Active Problem List   Diagnosis Date Noted  . Statin intolerance 05/23/2013  . Osteopenia 04/20/2013  . Degenerative disc disease, lumbar 02/08/2013  . Vitamin D deficiency 02/08/2013  . Overweight (BMI 25.0-29.9) 11/24/2012  . S/P left TKA 11/22/2012  . Bladder cancer ([place]) 11/04/2012  . Osteoarthritis of left knee 11/04/2012  . Hyperlipidemia LDL goal <70 11/28/2008  . Hypertension 11/28/2008  . CORONARY ATHEROSCLEROSIS NATIVE CORONARY ARTERY 11/28/2008   Outpatient Encounter Prescriptions as of 08/23/2015  Medication Sig  . atorvastatin (LIPITOR) 20 MG tablet Take 1 tablet (20 mg total) by mouth daily.  . calcium carbonate (TUMS - DOSED IN MG ELEMENTAL CALCIUM) 500 MG chewable tablet Chew 1 tablet by mouth as needed for indigestion or heartburn.  . cholecalciferol (VITAMIN D) 1000 UNITS tablet Take 3,000 Units by mouth daily.  . clotrimazole-betamethasone (LOTRISONE) cream Apply 1 application topically 2 (two) times daily. Use sparingly BID  . Co-Enzyme Q10 200 MG CAPS Take 1 capsule by mouth daily.  . Cyanocobalamin  (VITAMIN B 12 PO) Take by mouth.  . Flaxseed, Linseed, (FLAX SEED OIL) 1000 MG CAPS Take 1 capsule by mouth daily.   . hydrochlorothiazide (MICROZIDE) 12.5 MG capsule TAKE (1) CAPSULE DAILY  . metoprolol (LOPRESSOR) 50 MG tablet TAKE  (1)  TABLET TWICE A DAY.  . metroNIDAZOLE (METROGEL VAGINAL) 0.75 % vaginal gel Place 1 Applicatorful vaginally 2 (two) times daily.  . nitroGLYCERIN (NITROSTAT) 0.4 MG SL tablet Place 1 tablet (0.4 mg total) under the tongue every 5 (five) minutes as needed for chest pain.  [name] nystatin ointment (MYCOSTATIN) Apply 1 application topically 2 (two) times daily.  . Suvorexant (BELSOMRA) 10 MG TABS Take 1-2 tablets by mouth at bedtime as needed.  . [DISCONTINUED] ciprofloxacin (CIPRO) 500 MG tablet Take 1 tablet (500 mg total) by mouth 2 (two) times daily.   No facility-administered encounter medications on file as of 08/23/2015.      Review of Systems  Constitutional: Negative.   HENT: Negative.   Eyes: Negative.   Respiratory: Negative.   Cardiovascular: Negative.   Gastrointestinal: Negative.   Endocrine: Negative.   Genitourinary: Positive for frequency.  Musculoskeletal: Positive for back pain (low).  Skin: Negative.   Allergic/Immunologic: Negative.   Neurological: Negative.   Hematological: Negative.   Psychiatric/Behavioral: Negative.        Objective:   Physical Exam  Constitutional: She is oriented to person, place, and time. She appears well-developed and well-nourished. No distress.  Abdominal: Soft. Bowel sounds are normal. She exhibits  no mass. There is no tenderness. There is no rebound and no guarding.  Musculoskeletal: Normal range of motion. She exhibits tenderness.  Leg raising and hip abduction were good bilaterally with no pain. There was tenderness to palpation in the right low back, para lumbar area.  Neurological: She is alert and oriented to person, place, and time.  Skin: No rash noted.  Psychiatric: She has a normal mood and  affect. Her behavior is normal. Judgment and thought content normal.  Nursing note and vitals reviewed.  BP 131/66 mmHg  Pulse 68  Temp(Src) 97.7 F (36.5 C) (Oral)  Ht 5' 3.5" (1.613 m)  Wt 177 lb (80.287 kg)  BMI 30.86 kg/m2   [facility] reading (PRIMARY) by  Dr. Bobbe Medico spine films are pending                                       Assessment & Plan:  1. Low back pain, unspecified back pain laterality, with sciatica presence unspecified - Urinalysis, Complete - Urine culture - DG Lumbar Spine 2-3 Views; Future  2. Urinary frequency -Start Cipro 500 twice daily for 7 days while we wait for culture and sensitivity to be returned - Urinalysis, Complete - Urine culture  3. Malignant neoplasm of urinary bladder, unspecified site [facility]) -All up with urologist as planned the end of July  4. Hypertension -Blood pressure vitals signs are good today.  Meds ordered this encounter  Medications  . ciprofloxacin (CIPRO) 500 MG tablet    Sig: Take 1 tablet (500 mg total) by mouth 2 (two) times daily.    Dispense:  14 tablet    Refill:  0   Patient Instructions  Drink more fluids Take antibiotic regularly until completed and recheck urinalysis, clean catch midstream when antibiotic is completed We will call with culture and sensitivity results and if we have to change antibiotic we will let you know and we can call the antibiotic in for you once the culture and sensitivity is returned if necessary. We will also call with the results of the LS spine films. Once again drink plenty of fluids   Arrie Senate MD

## 2015-08-24 ENCOUNTER — Encounter: Payer: Self-pay | Admitting: Family Medicine

## 2015-08-24 ENCOUNTER — Ambulatory Visit: Payer: Medicare Other | Admitting: Family Medicine

## 2015-08-24 DIAGNOSIS — I714 Abdominal aortic aneurysm, without rupture, unspecified: Secondary | ICD-10-CM

## 2015-08-24 HISTORY — DX: Abdominal aortic aneurysm, without rupture, unspecified: I71.40

## 2015-08-24 LAB — URINE CULTURE: Organism ID, Bacteria: NO GROWTH

## 2015-08-28 NOTE — Progress Notes (Signed)
Cardiology Office Note   Date:  08/29/2015   ID:  Gabriela Smith, [name] 06/30/1940, MRN [id number]  PCP:  Redge Gainer, MD  Cardiologist:   Minus Breeding, MD   Chief Complaint  Patient presents with  . Coronary Artery Disease      History of Present Illness: Gabriela Smith is a 75 y.o. female who presents for evaluation of CAD.  She has a past history of PCI.  I have not seen her since 2011.  She has done well over the years.  She has not had any recent testing and I don't see a stress test in our system.  She is active although she is limited by some joint pain particularly recently.  She is currently having right hip pain.  The patient denies any new symptoms such as chest discomfort, neck or arm discomfort. There has been no new shortness of breath, PND or orthopnea. There have been no reported palpitations, presyncope or syncope.  Past Medical History  Diagnosis Date  . Hypertension 1993  . Hyperlipidemia 1993  . Nocturia   . History of bladder cancer followed by dr Karsten Ro    hx  TCC of bladder ,  Ta G3-  first occurence 02-20-2012--  s/p BCG tx's  and  mitomycin C  . Osteopenia   . Sigmoid diverticulosis   . CAD (coronary artery disease) CARDIOLOGIST- DR [facility]--  VISIT 01-02-10 IN EPIC    1993-- PTCA. Pt describes a near total blockage of apparently the LAD and a 65% stenosis elsewhere.  . Arthritis     OA lt knee- cortizone inj. q 4 months AND OA ALSO IN BACK. Had knee replaced-issue resolved    Past Surgical History  Procedure Laterality Date  . Umbilical hernia repair  2001  (approx)  . Transurethral resection of bladder tumor  03/14/2011    Procedure: TRANSURETHRAL RESECTION OF BLADDER TUMOR (TURBT);  Surgeon: Claybon Jabs, MD;  Location: [facility];  Service: Urology;  Laterality: N/A;  . Cystoscopy with biopsy  05/09/2011    Procedure: CYSTOSCOPY WITH BIOPSY;  Surgeon: Claybon Jabs, MD;  Location: [facility];  Service: Urology;   Laterality: N/A;  WITH BLADDER BIOPSY GYRUS  . Transurethral resection of bladder tumor  05/09/2011    Procedure: TRANSURETHRAL RESECTION OF BLADDER TUMOR (TURBT);  Surgeon: Claybon Jabs, MD;  [facility];  Service: Urology;  Laterality: N/A;  . Transurethral resection of bladder tumor  03/08/2012    Procedure: TRANSURETHRAL RESECTION OF BLADDER TUMOR (TURBT);  Surgeon: Claybon Jabs, MD;  Location: [facility];  Service: Urology;  Laterality: N/A;  . Total knee arthroplasty Left 11/22/2012    Procedure: LEFT TOTAL KNEE ARTHROPLASTY;  Surgeon: Mauri Pole, MD;  Location: [facility];  Service: Orthopedics;  Laterality: Left;  . Coronary angioplasty  1993    to LAD  . Cholecystectomy  2003 (approx)  . Colonoscopy  05-31-2003  . Transurethral resection of bladder tumor with gyrus (turbt-gyrus) N/A 03/24/2014    Procedure: TRANSURETHRAL RESECTION OF BLADDER TUMOR WITH GYRUS (TURBT-GYRUS);  Surgeon: Claybon Jabs, MD;  Location: [facility];  Service: Urology;  Laterality: N/A;     Current Outpatient Prescriptions  Medication Sig Dispense Refill  . atorvastatin (LIPITOR) 20 MG tablet Take 1 tablet (20 mg total) by mouth daily. 90 tablet 0  . calcium carbonate (TUMS - DOSED IN MG ELEMENTAL CALCIUM) 500 MG chewable tablet Chew  1 tablet by mouth as needed for indigestion or heartburn.    . cholecalciferol (VITAMIN D) 1000 UNITS tablet Take 2,000 Units by mouth daily.     [name] Co-Enzyme Q10 200 MG CAPS Take 1 capsule by mouth daily.    . Cyanocobalamin (VITAMIN B 12 PO) Take by mouth.    . Flaxseed, Linseed, (FLAX SEED OIL) 1000 MG CAPS Take 1 capsule by mouth daily.     . hydrochlorothiazide (MICROZIDE) 12.5 MG capsule TAKE (1) CAPSULE DAILY 90 capsule 0  . Melatonin-Pyridoxine (MELATONEX PO) Take 3 mg by mouth at bedtime.    . metoprolol (LOPRESSOR) 50 MG tablet TAKE  (1)  TABLET TWICE A DAY. 180 tablet 1  . nitroGLYCERIN (NITROSTAT) 0.4 MG SL  tablet Place 1 tablet (0.4 mg total) under the tongue every 5 (five) minutes as needed for chest pain. 50 tablet 3   No current facility-administered medications for this visit.    Allergies:   Fish allergy; Lasix; Macrodantin; Niaspan; Statins; Vicodin; Zetia; Codeine; and Sulfa antibiotics    Social History:  The patient  reports that she quit smoking about 39 years ago. Her smoking use included Cigarettes. She quit after 10 years of use. She has never used smokeless tobacco. She reports that she drinks about 2.4 oz of alcohol per week. She reports that she does not use illicit drugs.   Family History:  The patient's family history includes Coronary artery disease (age of onset: 22) in her father; Heart attack (age of onset: 38) in her daughter; Heart attack (age of onset: 3) in her mother; Heart disease (age of onset: 32) in her son; Osteoporosis in her sister.    ROS:  Please see the history of present illness.   Otherwise, review of systems are positive for none.   All other systems are reviewed and negative.    PHYSICAL EXAM: VS:  BP 156/92 mmHg  Pulse 80  Ht 5' 3.5" (1.613 m)  Wt 177 lb (80.287 kg)  BMI 30.86 kg/m2 , BMI Body mass index is 30.86 kg/(m^2). GENERAL:  Well appearing HEENT:  Pupils equal round and reactive, fundi not visualized, oral mucosa unremarkable NECK:  No jugular venous distention, waveform within normal limits, carotid upstroke brisk and symmetric, no bruits, no thyromegaly LYMPHATICS:  No cervical, inguinal adenopathy LUNGS:  Clear to auscultation bilaterally BACK:  No CVA tenderness CHEST:  Unremarkable HEART:  PMI not displaced or sustained,S1 and S2 within normal limits, no S3, no S4, no clicks, no rubs, no murmurs ABD:  Flat, positive bowel sounds normal in frequency in pitch, no bruits, no rebound, no guarding, no midline pulsatile mass, no hepatomegaly, no splenomegaly EXT:  2 plus pulses throughout, no edema, no cyanosis no clubbing SKIN:  No  rashes no nodules NEURO:  Cranial nerves II through XII grossly intact, motor grossly intact throughout PSYCH:  Cognitively intact, oriented to person place and time    EKG:  EKG is ordered today. The ekg ordered today demonstrates NSR, rate 77, axis WNL, intervals WNL, no acute ST T wave changes   Recent Labs: 06/28/2015: ALT 16; BUN 15; Creatinine, Ser 0.81; Platelets 235; Potassium 4.7; Sodium 141    Lipid Panel    Component Value Date/Time   CHOL 213* 06/28/2015 1448   CHOL 262* 12/04/2014 0858   TRIG 173* 06/28/2015 1448   TRIG 269* 12/04/2014 0858   HDL 62 06/28/2015 1448   HDL 56 12/04/2014 0858   CHOLHDL 3.4 06/28/2015 1448   LDLCALC  116* 06/28/2015 1448   LDLCALC 205* 11/28/2013 1215   LDLDIRECT 158* 03/26/2015 0851      Wt Readings from Last 3 Encounters:  08/29/15 177 lb (80.287 kg)  08/23/15 177 lb (80.287 kg)  08/01/15 180 lb (81.647 kg)      Other studies Reviewed: Additional studies/ records that were reviewed today include: CT. Review of the above records demonstrates:  Please see elsewhere in the note.     ASSESSMENT AND PLAN:  CAD:  It has been years since any screening.  I will bring the patient back for a POET (Plain Old Exercise Test). This will allow me to screen for obstructive coronary disease, risk stratify and very importantly provide a prescription for exercise.  However, she does not want to do this until her hip is improved.  She will let me know after this is better.  AAA:  Of note her CT recently of her spine demonstrated an AAA.  This needs to be further quantified and I will order an ultrasound.    DYSLIPIDEMIA:  The patient was intolerant of statins for awhile.  However, she was recently willing to try them again.   This is followed by Redge Gainer, MD   Current medicines are reviewed at length with the patient today.  The patient does not have concerns regarding medicines.  The following changes have been made:  no  change  Labs/ tests ordered today include:  AAA Duplex   Disposition:   FU with me in six months.      Signed, Minus Breeding, MD  08/29/2015 12:38 PM    [facility]

## 2015-08-29 ENCOUNTER — Ambulatory Visit (INDEPENDENT_AMBULATORY_CARE_PROVIDER_SITE_OTHER): Payer: Medicare Other | Admitting: Cardiology

## 2015-08-29 ENCOUNTER — Encounter: Payer: Self-pay | Admitting: Cardiology

## 2015-08-29 VITALS — BP 156/92 | HR 80 | Ht 63.5 in | Wt 177.0 lb

## 2015-08-29 DIAGNOSIS — I714 Abdominal aortic aneurysm, without rupture, unspecified: Secondary | ICD-10-CM

## 2015-08-29 DIAGNOSIS — I2510 Atherosclerotic heart disease of native coronary artery without angina pectoris: Secondary | ICD-10-CM

## 2015-08-29 NOTE — Patient Instructions (Signed)
Medication Instructions:  The current medical regimen is effective;  continue present plan and medications.  Testing/Procedures: Your physician has requested that you have an abdominal aorta duplex. During this test, an ultrasound is used to evaluate the aorta. Allow 30 minutes for this exam. Do not eat after midnight the day before and avoid carbonated beverages  Follow-Up: Follow up in 6 months with Dr. Percival Spanish.  You will receive a letter in the mail 2 months before you are due.  Please call us when you receive this letter to schedule your follow up appointment.  If you need a refill on your cardiac medications before your next appointment, please call your pharmacy.  Thank you for choosing [facility]!!

## 2015-08-30 NOTE — Addendum Note (Signed)
Addended by: Freada Bergeron on: 08/30/2015 05:10 PM   Modules accepted: Orders

## 2015-09-05 ENCOUNTER — Ambulatory Visit (INDEPENDENT_AMBULATORY_CARE_PROVIDER_SITE_OTHER): Payer: Medicare Other | Admitting: Physician Assistant

## 2015-09-05 ENCOUNTER — Encounter: Payer: Self-pay | Admitting: Physician Assistant

## 2015-09-05 VITALS — BP 149/77 | HR 75 | Temp 98.1°F | Ht 63.5 in | Wt 176.0 lb

## 2015-09-05 DIAGNOSIS — M25551 Pain in right hip: Secondary | ICD-10-CM

## 2015-09-05 NOTE — Progress Notes (Signed)
Subjective:     Patient ID: Gabriela Smith, female   DOB: 1940/09/19, 75 y.o.   MRN: [id number]  HPI Pt with progressive R hip pain for 1 month Denies any definite trauma Sx after ADL  Review of Systems  + pain but no swelling No weakness to the RLE No giving way No radicular sx    Objective:   Physical Exam FROM  Of the R hip No sx with int/ext rotation No sx with lateral abd/adduction Good strength + TTP over the prox lateral hip Offered injection  Pt consented Under sterile technique place 1.5cc lido/1.5cc Kenalog to the R bursa Dressing placed     Assessment:     1. Right hip pain        Plan:     Heat/Ice OTC meds for sx Activities as tol

## 2015-09-05 NOTE — Patient Instructions (Signed)
Bursitis °Bursitis is inflammation and irritation of a bursa, which is one of the small, fluid-filled sacs that cushion and protect the moving parts of your body. These sacs are located between bones and muscles, muscle attachments, or skin areas next to bones. A bursa protects these structures from the wear and tear that results from frequent movement. °An inflamed bursa causes pain and swelling. Fluid may build up inside the sac. Bursitis is most common near joints, especially the knees, elbows, hips, and shoulders. °CAUSES °Bursitis can be caused by:  °· Injury from: °¨ A direct blow, like falling on your knee or elbow. °¨ Overuse of a joint (repetitive stress). °· Infection. This can happen if bacteria gets into a bursa through a cut or scrape near a joint. °· Diseases that cause joint inflammation, such as gout and rheumatoid arthritis. °RISK FACTORS °You may be at risk for bursitis if you:  °· Have a job or hobby that involves a lot of repetitive stress on your joints. °· Have a condition that weakens your body's defense system (immune system), such as diabetes, cancer, or HIV. °· Lift and reach overhead often. °· Kneel or lean on hard surfaces often. °· Run or walk often. °SIGNS AND SYMPTOMS °The most common signs and symptoms of bursitis are: °· Pain that gets worse when you move the affected body part or put weight on it. °· Inflammation. °· Stiffness. °Other signs and symptoms may include: °· Redness. °· Tenderness. °· Warmth. °· Pain that continues after rest. °· Fever and chills. This may occur in bursitis caused by infection. °DIAGNOSIS °Bursitis may be diagnosed by:  °· Medical history and physical exam. °· MRI. °· A procedure to drain fluid from the bursa with a needle (aspiration). The fluid may be checked for signs of infection or gout. °· Blood tests to rule out other causes of inflammation. °TREATMENT  °Bursitis can usually be treated at home with rest, ice, compression, and elevation (RICE). For  mild bursitis, RICE treatment may be all you need. Other treatments may include: °· Nonsteroidal anti-inflammatory drugs (NSAIDs) to treat pain and inflammation. °· Corticosteroids to fight inflammation. You may have these drugs injected into and around the area of bursitis. °· Aspiration of bursitis fluid to relieve pain and improve movement. °· Antibiotic medicine to treat an infected bursa. °· A splint, brace, or walking aid. °· Physical therapy if you continue to have pain or limited movement. °· Surgery to remove a damaged or infected bursa. This may be needed if you have a very bad case of bursitis or if other treatments have not worked. °HOME CARE INSTRUCTIONS  °· Take medicines only as directed by your health care provider. °· If you were prescribed an antibiotic medicine, finish it all even if you start to feel better. °· Rest the affected area as directed by your health care provider. °¨ Keep the area elevated. °¨ Avoid activities that make pain worse. °· Apply ice to the injured area: °¨ Place ice in a plastic bag. °¨ Place a towel between your skin and the bag. °¨ Leave the ice on for 20 minutes, 2-3 times a day. °· Use splints, braces, pads, or walking aids as directed by your health care provider. °· Keep all follow-up visits as directed by your health care provider. This is important. °PREVENTION  °· Wear knee pads if you kneel often. °· Wear sturdy running or walking shoes that fit you well. °· Take regular breaks from repetitive activity. °· Warm   up by stretching before doing any strenuous activity. °· Maintain a healthy weight or lose weight as recommended by your health care provider. Ask your health care provider if you need help. °· Exercise regularly. Start any new physical activity gradually. °SEEK MEDICAL CARE IF:  °· Your bursitis is not responding to treatment or home care. °· You have a fever. °· You have chills. °  °This information is not intended to replace advice given to you by your  health care provider. Make sure you discuss any questions you have with your health care provider. °  °Document Released: 02/22/2000 Document Revised: 11/15/2014 Document Reviewed: 05/16/2013 °Elsevier Interactive Patient Education ©2016 [employer] Inc. ° °

## 2015-09-06 ENCOUNTER — Ambulatory Visit (HOSPITAL_COMMUNITY)
Admission: RE | Admit: 2015-09-06 | Discharge: 2015-09-06 | Disposition: A | Discharge status: Home / Self Care | Payer: Medicare Other | Source: Ambulatory Visit | Attending: Cardiology | Admitting: Cardiology

## 2015-09-06 DIAGNOSIS — I700 Atherosclerosis of aorta: Secondary | ICD-10-CM | POA: Diagnosis not present

## 2015-09-06 DIAGNOSIS — I10 Essential (primary) hypertension: Secondary | ICD-10-CM | POA: Insufficient documentation

## 2015-09-06 DIAGNOSIS — I708 Atherosclerosis of other arteries: Secondary | ICD-10-CM | POA: Diagnosis not present

## 2015-09-06 DIAGNOSIS — I2510 Atherosclerotic heart disease of native coronary artery without angina pectoris: Secondary | ICD-10-CM | POA: Insufficient documentation

## 2015-09-06 DIAGNOSIS — I714 Abdominal aortic aneurysm, without rupture, unspecified: Secondary | ICD-10-CM

## 2015-09-06 DIAGNOSIS — E785 Hyperlipidemia, unspecified: Secondary | ICD-10-CM | POA: Diagnosis not present

## 2015-09-07 ENCOUNTER — Telehealth: Payer: Self-pay | Admitting: *Deleted

## 2015-09-07 DIAGNOSIS — I714 Abdominal aortic aneurysm, without rupture, unspecified: Secondary | ICD-10-CM

## 2015-09-07 NOTE — Telephone Encounter (Signed)
AAA ordered place for 2 yrs

## 2015-09-07 NOTE — Telephone Encounter (Signed)
-----   Message from Minus Breeding, MD sent at 09/06/2015  1:30 PM EDT ----- Very small AAA.  Follow up in two years.    Call Ms. Cressey with the results and send results to Redge Gainer, MD

## 2015-09-12 ENCOUNTER — Other Ambulatory Visit: Payer: Self-pay | Admitting: Family Medicine

## 2015-10-01 ENCOUNTER — Ambulatory Visit (INDEPENDENT_AMBULATORY_CARE_PROVIDER_SITE_OTHER): Payer: Medicare Other | Admitting: Family Medicine

## 2015-10-01 ENCOUNTER — Encounter: Payer: Self-pay | Admitting: Family Medicine

## 2015-10-01 VITALS — BP 135/75 | Temp 98.3°F | Ht 63.5 in | Wt 176.4 lb

## 2015-10-01 DIAGNOSIS — J3081 Allergic rhinitis due to animal (cat) (dog) hair and dander: Secondary | ICD-10-CM

## 2015-10-01 DIAGNOSIS — J029 Acute pharyngitis, unspecified: Secondary | ICD-10-CM | POA: Diagnosis not present

## 2015-10-01 LAB — CULTURE, GROUP A STREP

## 2015-10-01 LAB — RAPID STREP SCREEN (MED CTR MEBANE ONLY): Strep Gp A Ag, IA W/Reflex: NEGATIVE

## 2015-10-01 NOTE — Progress Notes (Signed)
BP 135/75 (BP Location: Left Arm, Patient Position: Sitting, Cuff Size: Normal)   Temp 98.3 F (36.8 C) (Oral)   Ht 5' 3.5" (1.613 m)   Wt 176 lb 6.4 oz (80 kg)   BMI 30.76 kg/m    Subjective:    Patient ID: Gabriela Smith, female    DOB: Oct 01, 1940, 75 y.o.   MRN: [id number]  HPI: Gabriela Smith is a 75 y.o. female presenting on 10/01/2015 for Ear Pain (left ear began 2 days ago) and Sore Throat   HPI Sore throat and ear pain Patient is been having sore throat and ear pain is been going on for the past 3 days. She says is getting better or any worse. She says she will just was on a trip where they went and visited some friends over by the beach who had long-haired cats and she is not usually around cats. She denies any fevers or chills. She denies any shortness of breath or cough or wheezing. She is mainly having pain that is worse with swallowing on the left side of her throat going up into that left ear. She denies any pain on the right side or pressure on the right side. She denies any nasal drainage or postnasal drainage that she knows of. She has not really been using anything to help with this at this point.  Relevant past medical, surgical, family and social history reviewed and updated as indicated. Interim medical history since our last visit reviewed. Allergies and medications reviewed and updated.  Review of Systems  Constitutional: Negative for chills and fever.  HENT: Positive for congestion, postnasal drip, rhinorrhea, sinus pressure, sneezing and sore throat. Negative for ear discharge and ear pain.   Eyes: Negative for pain, redness and visual disturbance.  Respiratory: Negative for cough, chest tightness and shortness of breath.   Cardiovascular: Negative for chest pain and leg swelling.  Genitourinary: Negative for difficulty urinating and dysuria.  Musculoskeletal: Negative for back pain and gait problem.  Skin: Negative for rash.  Neurological: Negative for  light-headedness and headaches.  Psychiatric/Behavioral: Negative for agitation and behavioral problems.  All other systems reviewed and are negative.   Per HPI unless specifically indicated above     Medication List       Accurate as of 10/01/15 11:54 AM. Always use your most recent med list.          atorvastatin 20 MG tablet Commonly known as:  LIPITOR Take 1 tablet (20 mg total) by mouth daily.   calcium carbonate 500 MG chewable tablet Commonly known as:  TUMS - dosed in mg elemental calcium Chew 1 tablet by mouth as needed for indigestion or heartburn.   cholecalciferol 1000 units tablet Commonly known as:  VITAMIN D Take 2,000 Units by mouth daily.   Co-Enzyme Q10 200 MG Caps Take 1 capsule by mouth daily.   Flax Seed Oil 1000 MG Caps Take 1 capsule by mouth daily.   hydrochlorothiazide 12.5 MG capsule Commonly known as:  MICROZIDE TAKE (1) CAPSULE DAILY   MELATONEX PO Take 3 mg by mouth at bedtime.   metoprolol 50 MG tablet Commonly known as:  LOPRESSOR TAKE  (1)  TABLET TWICE A DAY.   nitroGLYCERIN 0.4 MG SL tablet Commonly known as:  NITROSTAT Place 1 tablet (0.4 mg total) under the tongue every 5 (five) minutes as needed for chest pain.   VITAMIN B 12 PO Take by mouth.  Objective:    BP 135/75 (BP Location: Left Arm, Patient Position: Sitting, Cuff Size: Normal)   Temp 98.3 F (36.8 C) (Oral)   Ht 5' 3.5" (1.613 m)   Wt 176 lb 6.4 oz (80 kg)   BMI 30.76 kg/m   Wt Readings from Last 3 Encounters:  10/01/15 176 lb 6.4 oz (80 kg)  09/05/15 176 lb (79.8 kg)  08/29/15 177 lb (80.3 kg)    Physical Exam  Constitutional: She is oriented to person, place, and time. She appears well-developed and well-nourished. No distress.  HENT:  Right Ear: Tympanic membrane, external ear and ear canal normal.  Left Ear: Tympanic membrane, external ear and ear canal normal.  Nose: Mucosal edema and rhinorrhea present. No epistaxis. Right sinus  exhibits no maxillary sinus tenderness and no frontal sinus tenderness. Left sinus exhibits no maxillary sinus tenderness and no frontal sinus tenderness.  Mouth/Throat: Uvula is midline and mucous membranes are normal. Posterior oropharyngeal edema and posterior oropharyngeal erythema present. No oropharyngeal exudate or tonsillar abscesses.  Eyes: Conjunctivae and EOM are normal. Pupils are equal, round, and reactive to light.  Cardiovascular: Normal rate, regular rhythm, normal heart sounds and intact distal pulses.   No murmur heard. Pulmonary/Chest: Effort normal and breath sounds normal. No respiratory distress. She has no wheezes.  Musculoskeletal: Normal range of motion. She exhibits no edema or tenderness.  Neurological: She is alert and oriented to person, place, and time. Coordination normal.  Skin: Skin is warm and dry. No rash noted. She is not diaphoretic.  Psychiatric: She has a normal mood and affect. Her behavior is normal.  Nursing note and vitals reviewed.  Strep negative    Assessment & Plan:   Problem List Items Addressed This Visit    None    Visit Diagnoses    Sore throat    -  Primary   Relevant Orders   Rapid strep screen (not at [facility])   Allergic rhinitis due to animal hair and dander       Patient recently seen a place with cats for the past few days and this is when this all started. Treat conservatively and let me know if it does not get better        Follow up plan: Return if symptoms worsen or fail to improve.  Counseling provided for all of the vaccine components Orders Placed This Encounter  Procedures  . Rapid strep screen (not at [facility])    Caryl Pina, MD [facility] 10/01/2015, 11:54 AM

## 2015-10-08 ENCOUNTER — Ambulatory Visit (INDEPENDENT_AMBULATORY_CARE_PROVIDER_SITE_OTHER): Payer: Medicare Other | Admitting: Pediatrics

## 2015-10-08 ENCOUNTER — Encounter: Payer: Self-pay | Admitting: Pediatrics

## 2015-10-08 VITALS — BP 166/80 | HR 72 | Temp 97.0°F | Ht 63.5 in | Wt 179.2 lb

## 2015-10-08 DIAGNOSIS — I10 Essential (primary) hypertension: Secondary | ICD-10-CM

## 2015-10-08 DIAGNOSIS — M25551 Pain in right hip: Secondary | ICD-10-CM | POA: Diagnosis not present

## 2015-10-08 NOTE — Progress Notes (Signed)
    Subjective:    Patient ID: Gabriela Smith, female    DOB: 06-14-1940, 75 y.o.   MRN: [id number]  CC: Groin Pain (Right)   HPI: Gabriela Smith is a 75 y.o. female presenting for Groin Pain (Right)  R groin pain comes and goes Going up stairs hurts more Motrin two at bedtime helps some with pain Otherwise feeling well No CP, no HA Says BP always elevated when coming in to clinic Taking meds regularly   Depression screen [facility] 2/9 10/08/2015 10/01/2015 09/05/2015 08/23/2015 08/01/2015  Decreased Interest 0 0 0 0 0  Down, Depressed, Hopeless 0 0 0 0 0  PHQ - 2 Score 0 0 0 0 0     Relevant past medical, surgical, family and social history reviewed and updated. Interim medical history since our last visit reviewed. Allergies and medications reviewed and updated.  History  Smoking Status  . Former Smoker  . Years: 10.00  . Types: Cigarettes  . Quit date: 03/09/1976  Smokeless Tobacco  . Never Used    ROS: Per HPI      Objective:    BP (!) 166/80   Pulse 72   Temp 97 F (36.1 C) (Oral)   Ht 5' 3.5" (1.613 m)   Wt 179 lb 3.2 oz (81.3 kg)   BMI 31.25 kg/m   Wt Readings from Last 3 Encounters:  10/08/15 179 lb 3.2 oz (81.3 kg)  10/01/15 176 lb 6.4 oz (80 kg)  09/05/15 176 lb (79.8 kg)     Gen: NAD, alert, cooperative with exam, NCAT EYES: EOMI, no scleral injection or icterus CV: NRRR, normal S1/S2, no murmur, distal pulses 2+ b/l Resp: CTABL, no wheezes, normal WOB Abd: +BS, soft, NTND. no guarding or organomegaly Ext: No edema, warm Neuro: Alert and oriented, strength equal b/l UE and LE, coordination grossly normal MSK: decreased ext>int rotation of L hip compared with R hip Equal leg length     Assessment & Plan:    Gabriela Smith was seen today for groin pain, decreased ROM of hip. Will get xray as below.  Diagnoses and all orders for this visit:  Hypertension Elevated today, asymptomatic Continue to check at home, let us know if elevated Cont current  meds  Right hip pain -     DG HIP UNILAT W OR W/O PELVIS 2-3 VIEWS RIGHT; Future -     Ambulatory referral to Orthopedic Surgery   Follow up plan: Return in about 4 weeks (around 11/05/2015), or if symptoms worsen or fail to improve.  Gabriela Found, MD [facility] 10/08/2015, 4:42 PM

## 2015-10-09 DIAGNOSIS — Z8551 Personal history of malignant neoplasm of bladder: Secondary | ICD-10-CM | POA: Diagnosis not present

## 2015-10-11 ENCOUNTER — Ambulatory Visit (INDEPENDENT_AMBULATORY_CARE_PROVIDER_SITE_OTHER): Payer: Medicare Other

## 2015-10-11 DIAGNOSIS — M25551 Pain in right hip: Secondary | ICD-10-CM

## 2015-10-13 ENCOUNTER — Other Ambulatory Visit: Payer: Self-pay | Admitting: Nurse Practitioner

## 2015-10-13 ENCOUNTER — Telehealth: Payer: Self-pay | Admitting: Family Medicine

## 2015-10-13 MED ORDER — CIPROFLOXACIN HCL 500 MG PO TABS: 500.0000 mg | ORAL_TABLET | Freq: Two times a day (BID) | ORAL | 0 refills | Status: DC

## 2015-10-13 NOTE — Telephone Encounter (Signed)
Patient aware rx sent to pharmacy.  

## 2015-10-13 NOTE — Progress Notes (Signed)
rx sent to pharamcy for UTI

## 2015-10-17 ENCOUNTER — Ambulatory Visit: Payer: Medicare Other | Admitting: Pharmacist

## 2015-10-19 ENCOUNTER — Telehealth: Payer: Self-pay | Admitting: Family Medicine

## 2015-10-19 MED ORDER — TRAMADOL HCL 50 MG PO TABS: 50.0000 mg | ORAL_TABLET | Freq: Three times a day (TID) | ORAL | 0 refills | Status: DC | PRN

## 2015-10-19 NOTE — Telephone Encounter (Signed)
Pt is having pretty bad hip pain --- she doesn't go to ortho until 11/02/15. She awhile back had some tramadol from a knee replacement and it worked well   She wants some just to have for night time until her appt.

## 2015-10-19 NOTE — Telephone Encounter (Signed)
Phoned in and pt aware 

## 2015-10-19 NOTE — Telephone Encounter (Signed)
Not on med list

## 2015-10-19 NOTE — Telephone Encounter (Signed)
Please call in a prescription for tramadol 50 mg 1 every 6-8 hours if needed for pain #20 and no refill

## 2015-11-02 DIAGNOSIS — M1611 Unilateral primary osteoarthritis, right hip: Secondary | ICD-10-CM | POA: Diagnosis not present

## 2015-11-02 DIAGNOSIS — M25551 Pain in right hip: Secondary | ICD-10-CM | POA: Diagnosis not present

## 2015-11-07 DIAGNOSIS — M1611 Unilateral primary osteoarthritis, right hip: Secondary | ICD-10-CM | POA: Diagnosis not present

## 2015-11-15 ENCOUNTER — Ambulatory Visit (INDEPENDENT_AMBULATORY_CARE_PROVIDER_SITE_OTHER): Payer: Medicare Other | Admitting: Family

## 2015-11-15 ENCOUNTER — Encounter: Payer: Self-pay | Admitting: Family

## 2015-11-15 VITALS — BP 167/89 | HR 84 | Temp 99.3°F | Ht 63.5 in | Wt 179.0 lb

## 2015-11-15 DIAGNOSIS — M25561 Pain in right knee: Secondary | ICD-10-CM

## 2015-11-15 DIAGNOSIS — M1712 Unilateral primary osteoarthritis, left knee: Secondary | ICD-10-CM | POA: Diagnosis not present

## 2015-11-15 MED ORDER — METHYLPREDNISOLONE ACETATE 40 MG/ML IJ SUSP
40.0000 mg | Freq: Once | INTRAMUSCULAR | Status: AC
  Administered 2015-11-15: 40 mg via INTRA_ARTICULAR

## 2015-11-15 MED ORDER — BUPIVACAINE HCL 0.25 % IJ SOLN
1.0000 mL | Freq: Once | INTRAMUSCULAR | Status: AC
  Administered 2015-11-15: 1 mL via INTRA_ARTICULAR

## 2015-11-15 NOTE — Progress Notes (Signed)
   Subjective:    Patient ID: Gabriela Smith, female    DOB: [birth year], 75 y.o.   MRN: [id number]  HPI Pt presents to the office today with right hip pain and right knee pain.  PT states she has a hip injection two weeks ago with no relief. Pt has been diagnosed with osteoarthritis of her right hip and does not have an appointment with Ortho until 3 months. PT states she is having intermittent pain in that right knee of 10 out 10. Pt takes Ultram as needed for pain that helps.    Review of Systems  Musculoskeletal: Positive for arthralgias and joint swelling.  All other systems reviewed and are negative.      Objective:   Physical Exam  Constitutional: She is oriented to person, place, and time. She appears well-developed and well-nourished. No distress.  HENT:  Head: Normocephalic.  Cardiovascular: Normal rate, regular rhythm, normal heart sounds and intact distal pulses.   No murmur heard. Pulmonary/Chest: Effort normal and breath sounds normal. No respiratory distress. She has no wheezes.  Abdominal: Soft. Bowel sounds are normal. She exhibits no distension. There is no tenderness.  Musculoskeletal: Normal range of motion. She exhibits edema (2+ in right knee, tenderness lateral right knee) and tenderness.  Neurological: She is alert and oriented to person, place, and time.  Skin: Skin is warm and dry.  Psychiatric: She has a normal mood and affect. Her behavior is normal. Judgment and thought content normal.  Vitals reviewed.   leftknee prepped with betadine Injected with Marcaine .5% plain and methylprednisolone with 25 guage needle x 1. Patient tolerated well.   BP (!) 167/89   Pulse 84   Temp 99.3 F (37.4 C) (Oral)   Ht 5' 3.5" (1.613 m)   Wt 179 lb (81.2 kg)   BMI 31.21 kg/m       Assessment & Plan:  1. Right knee pain - bupivacaine (MARCAINE) 0.25 % (with pres) injection 1 mL; Inject 1 mL into the articular space once. - methylPREDNISolone acetate  (DEPO-MEDROL) injection 40 mg; Inject 1 mL (40 mg total) into the articular space once.  2. Primary osteoarthritis of left knee - bupivacaine (MARCAINE) 0.25 % (with pres) injection 1 mL; Inject 1 mL into the articular space once. - methylPREDNISolone acetate (DEPO-MEDROL) injection 40 mg; Inject 1 mL (40 mg total) into the articular space once.  Rest Ice ROM exercises encouraged Follow up with ortho RTO prn   [name], FNP

## 2015-11-15 NOTE — Patient Instructions (Signed)

## 2015-11-23 DIAGNOSIS — M1611 Unilateral primary osteoarthritis, right hip: Secondary | ICD-10-CM | POA: Diagnosis not present

## 2015-11-23 DIAGNOSIS — M25551 Pain in right hip: Secondary | ICD-10-CM | POA: Diagnosis not present

## 2015-11-23 DIAGNOSIS — M7061 Trochanteric bursitis, right hip: Secondary | ICD-10-CM | POA: Diagnosis not present

## 2015-11-29 DIAGNOSIS — M25551 Pain in right hip: Secondary | ICD-10-CM | POA: Diagnosis not present

## 2015-11-29 DIAGNOSIS — M7061 Trochanteric bursitis, right hip: Secondary | ICD-10-CM | POA: Diagnosis not present

## 2015-11-29 DIAGNOSIS — M1611 Unilateral primary osteoarthritis, right hip: Secondary | ICD-10-CM | POA: Diagnosis not present

## 2015-11-30 NOTE — Progress Notes (Signed)
Scheduling for pre op testing- please place ORDERS IN EPIC   thanks

## 2015-12-06 ENCOUNTER — Telehealth: Payer: Self-pay | Admitting: Family Medicine

## 2015-12-06 NOTE — Telephone Encounter (Signed)
Pt had recent OV Will bring form by

## 2015-12-07 ENCOUNTER — Ambulatory Visit (INDEPENDENT_AMBULATORY_CARE_PROVIDER_SITE_OTHER): Payer: Medicare Other

## 2015-12-07 DIAGNOSIS — Z23 Encounter for immunization: Secondary | ICD-10-CM

## 2015-12-11 NOTE — H&P (Signed)
TOTAL HIP ADMISSION H&P  Patient is admitted for right total hip arthroplasty, anterior approach.  Subjective:  Chief Complaint:   Right hip primary OA /pain  HPI: Gabriela Smith, 75 y.o. female, has a history of pain and functional disability in the right hip(s) due to arthritis and patient has failed non-surgical conservative treatments for greater than 12 weeks to include NSAID's and/or analgesics, corticosteriod injections and activity modification.  Onset of symptoms was gradual starting 1+ years ago with gradually worsening course since that time.The patient noted no past surgery on the right hip(s).  Patient currently rates pain in the right hip at 10 out of 10 with activity. Patient has night pain, worsening of pain with activity and weight bearing, trendelenberg gait, pain that interfers with activities of daily living and pain with passive range of motion. Patient has evidence of periarticular osteophytes and joint space narrowing by imaging studies. This condition presents safety issues increasing the risk of falls.   There is no current active infection.   Risks, benefits and expectations were discussed with the patient.  Risks including but not limited to the risk of anesthesia, blood clots, nerve damage, blood vessel damage, failure of the prosthesis, infection and up to and including death.  Patient understand the risks, benefits and expectations and wishes to proceed with surgery.   PCP: Redge Gainer, MD  D/C Plans:      Home with HHPT  Post-op Meds:       No Rx given  Tranexamic Acid:      To be given - IV   Decadron:      Is to be given  FYI:     ASA  Tramadol and APAP     Patient Active Problem List   Diagnosis Date Noted  . AAA (abdominal aortic aneurysm) without rupture ([place]) 08/24/2015  . Statin intolerance 05/23/2013  . Osteopenia 04/20/2013  . Degenerative disc disease, lumbar 02/08/2013  . Vitamin D deficiency 02/08/2013  . Overweight (BMI 25.0-29.9)  11/24/2012  . S/P left TKA 11/22/2012  . Bladder cancer ([place]) 11/04/2012  . Osteoarthritis of left knee 11/04/2012  . Hyperlipidemia LDL goal <70 11/28/2008  . Hypertension 11/28/2008  . CORONARY ATHEROSCLEROSIS NATIVE CORONARY ARTERY 11/28/2008   Past Medical History:  Diagnosis Date  . Arthritis    OA lt knee- cortizone inj. q 4 months AND OA ALSO IN BACK. Had knee replaced-issue resolved  . CAD (coronary artery disease) CARDIOLOGIST- [facility]--  VISIT 01-02-10 IN EPIC   1993-- PTCA. Pt describes a near total blockage of apparently the LAD and a 65% stenosis elsewhere.  [name] History of bladder cancer followed by dr Karsten Ro   hx  TCC of bladder ,  Ta G3-  first occurence 02-20-2012--  s/p BCG tx's  and  mitomycin C  . Hyperlipidemia 1993  . Hypertension 1993  . Nocturia   . Osteopenia   . Sigmoid diverticulosis     Past Surgical History:  Procedure Laterality Date  . CHOLECYSTECTOMY  2003 (approx)  . COLONOSCOPY  05-31-2003  . CORONARY ANGIOPLASTY  1993   to LAD  . CYSTOSCOPY WITH BIOPSY  05/09/2011   Procedure: CYSTOSCOPY WITH BIOPSY;  Surgeon: Claybon Jabs, MD;  Location: [facility];  Service: Urology;  Laterality: N/A;  WITH BLADDER BIOPSY GYRUS  . TOTAL KNEE ARTHROPLASTY Left 11/22/2012   Procedure: LEFT TOTAL KNEE ARTHROPLASTY;  Surgeon: Mauri Pole, MD;  Location: [facility];  Service: Orthopedics;  Laterality: Left;  .  TRANSURETHRAL RESECTION OF BLADDER TUMOR  03/14/2011   Procedure: TRANSURETHRAL RESECTION OF BLADDER TUMOR (TURBT);  Surgeon: Claybon Jabs, MD;  Location: [facility];  Service: Urology;  Laterality: N/A;  . TRANSURETHRAL RESECTION OF BLADDER TUMOR  05/09/2011   Procedure: TRANSURETHRAL RESECTION OF BLADDER TUMOR (TURBT);  Surgeon: Claybon Jabs, MD;  Location: [facility];  Service: Urology;  Laterality: N/A;  . TRANSURETHRAL RESECTION OF BLADDER TUMOR  03/08/2012   Procedure: TRANSURETHRAL RESECTION OF BLADDER  TUMOR (TURBT);  Surgeon: Claybon Jabs, MD;  Location: [facility];  Service: Urology;  Laterality: N/A;  . TRANSURETHRAL RESECTION OF BLADDER TUMOR WITH GYRUS (TURBT-GYRUS) N/A 03/24/2014   Procedure: TRANSURETHRAL RESECTION OF BLADDER TUMOR WITH GYRUS (TURBT-GYRUS);  Surgeon: Claybon Jabs, MD;  Location: [facility];  Service: Urology;  Laterality: N/A;  . UMBILICAL HERNIA REPAIR  2001  (approx)    No prescriptions prior to admission.   Allergies  Allergen Reactions  . Fish Allergy Hives  . Lasix [Furosemide] Other (See Comments)    Edema, elevated BP  . Macrodantin [Nitrofurantoin Macrocrystal] Other (See Comments)    Unknown reaction   . Niaspan [Niacin Er] Other (See Comments)    Muscle aches  . Statins Other (See Comments)    Muscle aches/ cramps  . Vicodin [Hydrocodone-Acetaminophen] Other (See Comments)    SEVERE N/V  . Zetia [Ezetimibe] Other (See Comments)  . Codeine Rash  . Sulfa Antibiotics Rash    Social History  Substance Use Topics  . Smoking status: Former Smoker    Years: 10.00    Types: Cigarettes    Quit date: 03/09/1976  . Smokeless tobacco: Never Used  . Alcohol use 2.4 oz/week    4 Glasses of wine per week     Comment: Occasionally    Family History  Problem Relation Age of Onset  . Coronary artery disease Father 41  . Heart attack Mother 51  . Osteoporosis Sister   . Heart attack Daughter 30    CABG x 5  . Heart disease Son 96    Stents x 3     Review of Systems  Constitutional: Negative.   HENT: Negative.   Eyes: Negative.   Respiratory: Negative.   Cardiovascular: Negative.   Gastrointestinal: Negative.   Genitourinary: Positive for frequency.  Musculoskeletal: Positive for joint pain.  Skin: Negative.   Neurological: Negative.   Endo/Heme/Allergies: Negative.   Psychiatric/Behavioral: Negative.     Objective:  Physical Exam  Constitutional: She is oriented to person, place, and time. She appears  well-developed.  HENT:  Head: Normocephalic.  Eyes: Pupils are equal, round, and reactive to light.  Neck: Neck supple. No JVD present. No tracheal deviation present. No thyromegaly present.  Cardiovascular: Normal rate, regular rhythm, normal heart sounds and intact distal pulses.   Respiratory: Effort normal and breath sounds normal. No respiratory distress. She has no wheezes.  GI: Soft. There is no tenderness. There is no guarding.  Musculoskeletal:       Right hip: She exhibits decreased range of motion, decreased strength, tenderness and bony tenderness. She exhibits no swelling, no deformity and no laceration.  Lymphadenopathy:    She has no cervical adenopathy.  Neurological: She is alert and oriented to person, place, and time. A sensory deficit (occassional tingling in bilateral feet) is present.  Skin: Skin is warm and dry.  Psychiatric: She has a normal mood and affect.  Labs:  Estimated body mass index is 31.21 kg/m as calculated from the following:   Height as of 11/15/15: 5' 3.5" (1.613 m).   Weight as of 11/15/15: 81.2 kg (179 lb).   Imaging Review Plain radiographs demonstrate severe degenerative joint disease of the right hip(s). The bone quality appears to be good for age and reported activity level.  Assessment/Plan:  End stage arthritis, right hip(s)  The patient history, physical examination, clinical judgement of the provider and imaging studies are consistent with end stage degenerative joint disease of the right hip(s) and total hip arthroplasty is deemed medically necessary. The treatment options including medical management, injection therapy, arthroscopy and arthroplasty were discussed at length. The risks and benefits of total hip arthroplasty were presented and reviewed. The risks due to aseptic loosening, infection, stiffness, dislocation/subluxation,  thromboembolic complications and other imponderables were discussed.  The patient acknowledged the  explanation, agreed to proceed with the plan and consent was signed. Patient is being admitted for inpatient treatment for surgery, pain control, PT, OT, prophylactic antibiotics, VTE prophylaxis, progressive ambulation and ADL's and discharge planning.The patient is planning to be discharged home with home health services.      [place] Pugh Tracie Lindbloom   PA-C  12/11/2015, 8:59 PM

## 2015-12-12 ENCOUNTER — Telehealth: Payer: Self-pay | Admitting: Cardiology

## 2015-12-12 ENCOUNTER — Telehealth: Payer: Self-pay | Admitting: *Deleted

## 2015-12-12 DIAGNOSIS — Z01818 Encounter for other preprocedural examination: Secondary | ICD-10-CM

## 2015-12-12 NOTE — Telephone Encounter (Signed)
New Message ° °Pt returning RN call. Please call back to discuss  °

## 2015-12-12 NOTE — Telephone Encounter (Signed)
Pt aware that she will need a stress test before she can be cleared for her hip replacement, Ordered for [employer] place.

## 2015-12-13 ENCOUNTER — Telehealth (HOSPITAL_COMMUNITY): Payer: Self-pay

## 2015-12-13 NOTE — Telephone Encounter (Signed)
Encounter complete. 

## 2015-12-14 ENCOUNTER — Ambulatory Visit (HOSPITAL_COMMUNITY)
Admission: RE | Admit: 2015-12-14 | Discharge: 2015-12-14 | Disposition: A | Discharge status: Home / Self Care | Payer: Medicare Other | Source: Ambulatory Visit | Attending: Cardiology | Admitting: Cardiology

## 2015-12-14 DIAGNOSIS — Z01818 Encounter for other preprocedural examination: Secondary | ICD-10-CM | POA: Diagnosis not present

## 2015-12-14 LAB — MYOCARDIAL PERFUSION IMAGING
LV dias vol: 74 mL (ref 46–106)
LV sys vol: 29 mL
Peak HR: 116 {beats}/min
Rest HR: 72 {beats}/min
SDS: 6
SRS: 2
SSS: 8
TID: 1.34

## 2015-12-14 MED ORDER — REGADENOSON 0.4 MG/5ML IV SOLN
0.4000 mg | Freq: Once | INTRAVENOUS | Status: AC
  Administered 2015-12-14: 0.4 mg via INTRAVENOUS

## 2015-12-14 MED ORDER — TECHNETIUM TC 99M TETROFOSMIN IV KIT
32.0000 | PACK | Freq: Once | INTRAVENOUS | Status: AC | PRN
  Administered 2015-12-14: 32 via INTRAVENOUS
  Filled 2015-12-14: qty 32

## 2015-12-14 MED ORDER — AMINOPHYLLINE 25 MG/ML IV SOLN
75.0000 mg | Freq: Once | INTRAVENOUS | Status: AC
  Administered 2015-12-14: 75 mg via INTRAVENOUS

## 2015-12-14 MED ORDER — TECHNETIUM TC 99M TETROFOSMIN IV KIT
10.1000 | PACK | Freq: Once | INTRAVENOUS | Status: AC | PRN
  Administered 2015-12-14: 10.1 via INTRAVENOUS
  Filled 2015-12-14: qty 10

## 2015-12-19 ENCOUNTER — Encounter (HOSPITAL_COMMUNITY): Payer: Self-pay

## 2015-12-19 ENCOUNTER — Encounter (HOSPITAL_COMMUNITY)
Admission: RE | Admit: 2015-12-19 | Discharge: 2015-12-19 | Disposition: A | Discharge status: Home / Self Care | Payer: Medicare Other | Source: Ambulatory Visit | Attending: Orthopedic Surgery | Admitting: Orthopedic Surgery

## 2015-12-19 ENCOUNTER — Encounter (INDEPENDENT_AMBULATORY_CARE_PROVIDER_SITE_OTHER): Payer: Self-pay

## 2015-12-19 DIAGNOSIS — M25551 Pain in right hip: Secondary | ICD-10-CM | POA: Diagnosis not present

## 2015-12-19 DIAGNOSIS — M1611 Unilateral primary osteoarthritis, right hip: Secondary | ICD-10-CM | POA: Insufficient documentation

## 2015-12-19 DIAGNOSIS — Z01812 Encounter for preprocedural laboratory examination: Secondary | ICD-10-CM | POA: Insufficient documentation

## 2015-12-19 HISTORY — DX: Abdominal aortic aneurysm, without rupture, unspecified: I71.40

## 2015-12-19 HISTORY — DX: Abdominal aortic aneurysm, without rupture: I71.4

## 2015-12-19 LAB — CBC
HCT: 38.4 % (ref 36.0–46.0)
Hemoglobin: 12.7 g/dL (ref 12.0–15.0)
MCH: 31.8 pg (ref 26.0–34.0)
MCHC: 33.1 g/dL (ref 30.0–36.0)
MCV: 96 fL (ref 78.0–100.0)
Platelets: 236 K/uL (ref 150–400)
RBC: 4 MIL/uL (ref 3.87–5.11)
RDW: 13.3 % (ref 11.5–15.5)
WBC: 6.6 K/uL (ref 4.0–10.5)

## 2015-12-19 LAB — BASIC METABOLIC PANEL
Anion gap: 8 (ref 5–15)
BUN: 18 mg/dL (ref 6–20)
CO2: 28 mmol/L (ref 22–32)
Calcium: 9.9 mg/dL (ref 8.9–10.3)
Chloride: 103 mmol/L (ref 101–111)
Creatinine, Ser: 0.7 mg/dL (ref 0.44–1.00)
GFR calc Af Amer: >60 mL/min (ref >60)
GFR calc non Af Amer: >60 mL/min (ref >60)
Glucose, Bld: 101 mg/dL — ABNORMAL HIGH (ref 65–99)
Potassium: 4.8 mmol/L (ref 3.5–5.1)
Sodium: 139 mmol/L (ref 135–145)

## 2015-12-19 LAB — SURGICAL PCR SCREEN
MRSA, PCR: NEGATIVE
Staphylococcus aureus: NEGATIVE

## 2015-12-19 LAB — PROTIME-INR
INR: 0.84
Prothrombin Time: 11.5 s (ref 11.4–15.2)

## 2015-12-19 NOTE — Patient Instructions (Signed)
Gabriela Smith  12/19/2015   Your procedure is scheduled on: Tuesday December 25, 2015  Report to [facility]  Entrance take [place]  elevators to 3rd floor to  [place] at 1:00 PM.  Call this number if you have problems the morning of surgery [phone]   Remember: ONLY 1 PERSON MAY GO WITH YOU TO SHORT STAY TO GET  READY MORNING OF [place].  Do not eat food After Midnight but may take clear liquid diet till 10:00 am day of surgery then nothing by mouth.      Take these medicines the morning of surgery with A SIP OF WATER: Metoprolol                                 You may not have any metal on your body including hair pins and              piercings  Do not wear jewelry, make-up, lotions, powders or perfumes, deodorant             Do not wear nail polish.  Do not shave  48 hours prior to surgery.     Do not bring valuables to the hospital. [place].  Contacts, dentures or bridgework may not be worn into surgery.  Leave suitcase in the car. After surgery it may be brought to your room.               Please read over the following fact sheets you were given:MRSA INFORMATION SHEET; INCENTIVE SPIROMETER; BLOOD TRANSFUSION INFORMATION SHEET  _____________________________________________________________________             [facility] - Preparing for Surgery Before surgery, you can play an important role.  Because skin is not sterile, your skin needs to be as free of germs as possible.  You can reduce the number of germs on your skin by washing with CHG (chlorahexidine gluconate) soap before surgery.  CHG is an antiseptic cleaner which kills germs and bonds with the skin to continue killing germs even after washing. Please DO NOT use if you have an allergy to CHG or antibacterial soaps.  If your skin becomes reddened/irritated stop using the CHG and inform your nurse when you arrive at Short  Stay. Do not shave (including legs and underarms) for at least 48 hours prior to the first CHG shower.  You may shave your face/neck. Please follow these instructions carefully:  1.  Shower with CHG Soap the night before surgery and the  morning of Surgery.  2.  If you choose to wash your hair, wash your hair first as usual with your  normal  shampoo.  3.  After you shampoo, rinse your hair and body thoroughly to remove the  shampoo.                           4.  Use CHG as you would any other liquid soap.  You can apply chg directly  to the skin and wash                       Gently with a scrungie or clean washcloth.  5.  Apply the CHG Soap to your body ONLY FROM THE NECK DOWN.   Do not use on face/ open                           Wound or open sores. Avoid contact with eyes, ears mouth and genitals (private parts).                       Wash face,  Genitals (private parts) with your normal soap.             6.  Wash thoroughly, paying special attention to the area where your surgery  will be performed.  7.  Thoroughly rinse your body with warm water from the neck down.  8.  DO NOT shower/wash with your normal soap after using and rinsing off  the CHG Soap.                9.  Pat yourself dry with a clean towel.            10.  Wear clean pajamas.            11.  Place clean sheets on your bed the night of your first shower and do not  sleep with pets. Day of Surgery : Do not apply any lotions/deodorants the morning of surgery.  Please wear clean clothes to the hospital/surgery center.  FAILURE TO FOLLOW THESE INSTRUCTIONS MAY RESULT IN THE CANCELLATION OF YOUR SURGERY PATIENT SIGNATURE_________________________________  NURSE SIGNATURE__________________________________  ________________________________________________________________________    CLEAR LIQUID DIET   Foods Allowed                                                                     Foods Excluded  Coffee and tea,  regular and decaf                             liquids that you cannot  Plain Jell-O in any flavor                                             see through such as: Fruit ices (not with fruit pulp)                                     milk, soups, orange juice  Iced Popsicles                                    All solid food Carbonated beverages, regular and diet                                    Cranberry, grape and apple juices Sports drinks like Gatorade Lightly seasoned clear broth or consume(fat free) Sugar, honey syrup  Sample Menu Breakfast  Lunch                                     Supper Cranberry juice                    Beef broth                            Chicken broth Jell-O                                     Grape juice                           Apple juice Coffee or tea                        Jell-O                                      Popsicle                                                Coffee or tea                        Coffee or tea  _____________________________________________________________________    Incentive Spirometer  An incentive spirometer is a tool that can help keep your lungs clear and active. This tool measures how well you are filling your lungs with each breath. Taking long deep breaths may help reverse or decrease the chance of developing breathing (pulmonary) problems (especially infection) following:  A long period of time when you are unable to move or be active. BEFORE THE PROCEDURE   If the spirometer includes an indicator to show your best effort, your nurse or respiratory therapist will set it to a desired goal.  If possible, sit up straight or lean slightly forward. Try not to slouch.  Hold the incentive spirometer in an upright position. INSTRUCTIONS FOR USE  1. Sit on the edge of your bed if possible, or sit up as far as you can in bed or on a chair. 2. Hold the incentive spirometer in an upright  position. 3. Breathe out normally. 4. Place the mouthpiece in your mouth and seal your lips tightly around it. 5. Breathe in slowly and as deeply as possible, raising the piston or the ball toward the top of the column. 6. Hold your breath for 3-5 seconds or for as long as possible. Allow the piston or ball to fall to the bottom of the column. 7. Remove the mouthpiece from your mouth and breathe out normally. 8. Rest for a few seconds and repeat Steps 1 through 7 at least 10 times every 1-2 hours when you are awake. Take your time and take a few normal breaths between deep breaths. 9. The spirometer may include an indicator to show your best effort. Use the indicator as a goal to work toward during each repetition. 10. After each set of 10 deep breaths,  practice coughing to be sure your lungs are clear. If you have an incision (the cut made at the time of surgery), support your incision when coughing by placing a pillow or rolled up towels firmly against it. Once you are able to get out of bed, walk around indoors and cough well. You may stop using the incentive spirometer when instructed by your caregiver.  RISKS AND COMPLICATIONS  Take your time so you do not get dizzy or light-headed.  If you are in pain, you may need to take or ask for pain medication before doing incentive spirometry. It is harder to take a deep breath if you are having pain. AFTER USE  Rest and breathe slowly and easily.  It can be helpful to keep track of a log of your progress. Your caregiver can provide you with a simple table to help with this. If you are using the spirometer at home, follow these instructions: [place] IF:   You are having difficultly using the spirometer.  You have trouble using the spirometer as often as instructed.  Your pain medication is not giving enough relief while using the spirometer.  You develop fever of 100.5 F (38.1 C) or higher. SEEK IMMEDIATE MEDICAL CARE IF:    You cough up bloody sputum that had not been present before.  You develop fever of 102 F (38.9 C) or greater.  You develop worsening pain at or near the incision site. MAKE SURE YOU:   Understand these instructions.  Will watch your condition.  Will get help right away if you are not doing well or get worse. Document Released: 07/07/2006 Document Revised: 05/19/2011 Document Reviewed: 09/07/2006 ExitCare Patient Information 2014 Exit[employer], [place].   ________________________________________________________________________  WHAT IS A BLOOD TRANSFUSION? Blood Transfusion Information  A transfusion is the replacement of blood or some of its parts. Blood is made up of multiple cells which provide different functions.  Red blood cells carry oxygen and are used for blood loss replacement.  White blood cells fight against infection.  Platelets control bleeding.  Plasma helps clot blood.  Other blood products are available for specialized needs, such as hemophilia or other clotting disorders. BEFORE THE TRANSFUSION  Who gives blood for transfusions?   Healthy volunteers who are fully evaluated to make sure their blood is safe. This is blood bank blood. Transfusion therapy is the safest it has ever been in the practice of medicine. Before blood is taken from a donor, a complete history is taken to make sure that person has no history of diseases nor engages in risky social behavior (examples are intravenous drug use or sexual activity with multiple partners). The donor's travel history is screened to minimize risk of transmitting infections, such as malaria. The donated blood is tested for signs of infectious diseases, such as HIV and hepatitis. The blood is then tested to be sure it is compatible with you in order to minimize the chance of a transfusion reaction. If you or a relative donates blood, this is often done in anticipation of surgery and is not appropriate for emergency  situations. It takes many days to process the donated blood. RISKS AND COMPLICATIONS Although transfusion therapy is very safe and saves many lives, the main dangers of transfusion include:   Getting an infectious disease.  Developing a transfusion reaction. This is an allergic reaction to something in the blood you were given. Every precaution is taken to prevent this. The decision to have a blood transfusion has been  considered carefully by your caregiver before blood is given. Blood is not given unless the benefits outweigh the risks. AFTER THE TRANSFUSION  Right after receiving a blood transfusion, you will usually feel much better and more energetic. This is especially true if your red blood cells have gotten low (anemic). The transfusion raises the level of the red blood cells which carry oxygen, and this usually causes an energy increase.  The nurse administering the transfusion will monitor you carefully for complications. HOME CARE INSTRUCTIONS  No special instructions are needed after a transfusion. You may find your energy is better. Speak with your caregiver about any limitations on activity for underlying diseases you may have. SEEK MEDICAL CARE IF:   Your condition is not improving after your transfusion.  You develop redness or irritation at the intravenous (IV) site. SEEK IMMEDIATE MEDICAL CARE IF:  Any of the following symptoms occur over the next 12 hours:  Shaking chills.  You have a temperature by mouth above 102 F (38.9 C), not controlled by medicine.  Chest, back, or muscle pain.  People around you feel you are not acting correctly or are confused.  Shortness of breath or difficulty breathing.  Dizziness and fainting.  You get a rash or develop hives.  You have a decrease in urine output.  Your urine turns a dark color or changes to pink, red, or brown. Any of the following symptoms occur over the next 10 days:  You have a temperature by mouth above  102 F (38.9 C), not controlled by medicine.  Shortness of breath.  Weakness after normal activity.  The white part of the eye turns yellow (jaundice).  You have a decrease in the amount of urine or are urinating less often.  Your urine turns a dark color or changes to pink, red, or brown. Document Released: 02/22/2000 Document Revised: 05/19/2011 Document Reviewed: 10/11/2007 [facility] Patient Information 2014 [place], [place].  _______________________________________________________________________

## 2015-12-20 ENCOUNTER — Telehealth: Payer: Self-pay | Admitting: Cardiology

## 2015-12-20 DIAGNOSIS — Z01818 Encounter for other preprocedural examination: Secondary | ICD-10-CM

## 2015-12-20 NOTE — Telephone Encounter (Signed)
New message      Request for surgical clearance:  What type of surgery is being performed?  Rt hip replacement When is this surgery scheduled? 12-25-15 Are there any medications that need to be held prior to surgery and how long? Need cardiac clearance---pt had stress test but it was not indicated if she was cleared 1. Name of physician performing surgery?  Dr Alvan Dame  What is your office phone and fax number? Ok to put in epic

## 2015-12-21 NOTE — Telephone Encounter (Signed)
Ordered placed for Dobutamine Echo on Monday, send to scheduler to be schedule.

## 2015-12-21 NOTE — Telephone Encounter (Signed)
Pt have appt for Dubotamine Echo on Monday oct 16th @ 7:30 am

## 2015-12-21 NOTE — Telephone Encounter (Signed)
I was able to contact the patient.  I reviewed the images with Dr. Johnsie Cancel.  She has no symptoms.  However, the perfusion study suggested that there could be a significant LAD lesions.  However, this was not a conclusive study.  I do not think that cardiac cath is indicated preop.  However, another imaging study is indicated and we will plan to do this Monday morning.  If it is abnormal she will need to have surgery canceled and have elective cath.  If it is negative she will be able to proceed with surgery.

## 2015-12-21 NOTE — Telephone Encounter (Signed)
Follow up      Calling to get update on surgical clearance.  Pt is scheduled for surgery on Tuesday and they need to know something today

## 2015-12-21 NOTE — Telephone Encounter (Signed)
Informed pre-surgical  - attempting  To contact Dr Percival Spanish

## 2015-12-22 ENCOUNTER — Other Ambulatory Visit: Payer: Self-pay | Admitting: Family Medicine

## 2015-12-24 ENCOUNTER — Ambulatory Visit (HOSPITAL_BASED_OUTPATIENT_CLINIC_OR_DEPARTMENT_OTHER): Payer: Medicare Other

## 2015-12-24 ENCOUNTER — Other Ambulatory Visit (HOSPITAL_COMMUNITY): Payer: Medicare Other

## 2015-12-24 ENCOUNTER — Ambulatory Visit (HOSPITAL_COMMUNITY): Payer: Medicare Other

## 2015-12-24 ENCOUNTER — Telehealth: Payer: Self-pay | Admitting: Cardiology

## 2015-12-24 ENCOUNTER — Encounter (INDEPENDENT_AMBULATORY_CARE_PROVIDER_SITE_OTHER): Payer: Self-pay

## 2015-12-24 DIAGNOSIS — Z9861 Coronary angioplasty status: Secondary | ICD-10-CM | POA: Diagnosis not present

## 2015-12-24 DIAGNOSIS — Z96652 Presence of left artificial knee joint: Secondary | ICD-10-CM | POA: Diagnosis not present

## 2015-12-24 DIAGNOSIS — Z87891 Personal history of nicotine dependence: Secondary | ICD-10-CM | POA: Diagnosis not present

## 2015-12-24 DIAGNOSIS — M1611 Unilateral primary osteoarthritis, right hip: Secondary | ICD-10-CM | POA: Diagnosis not present

## 2015-12-24 DIAGNOSIS — I714 Abdominal aortic aneurysm, without rupture: Secondary | ICD-10-CM

## 2015-12-24 DIAGNOSIS — Z8551 Personal history of malignant neoplasm of bladder: Secondary | ICD-10-CM | POA: Insufficient documentation

## 2015-12-24 DIAGNOSIS — Z01818 Encounter for other preprocedural examination: Secondary | ICD-10-CM

## 2015-12-24 DIAGNOSIS — E785 Hyperlipidemia, unspecified: Secondary | ICD-10-CM | POA: Diagnosis not present

## 2015-12-24 DIAGNOSIS — I2510 Atherosclerotic heart disease of native coronary artery without angina pectoris: Secondary | ICD-10-CM | POA: Diagnosis not present

## 2015-12-24 DIAGNOSIS — I10 Essential (primary) hypertension: Secondary | ICD-10-CM | POA: Diagnosis not present

## 2015-12-24 MED ORDER — DOBUTAMINE INFUSION FOR EP/ECHO/NUC (1000 MCG/ML)
20.0000 ug/kg/min | INTRAVENOUS | Status: AC
  Administered 2015-12-24: 20 ug/kg/min via INTRAVENOUS

## 2015-12-24 MED ORDER — PERFLUTREN LIPID MICROSPHERE
4.0000 mL | INTRAVENOUS | Status: AC | PRN
  Administered 2015-12-24: 4 mL via INTRAVENOUS

## 2015-12-24 NOTE — Telephone Encounter (Signed)
Discussed w Dr. Debara Pickett - results reviewed by Dr. Johnsie Cancel at 4:30pm.  Dr. Debara Pickett advised OK based on findings for patient to proceed w tomorrow's surgery. Notified patient & she is aware to proceed as scheduled for surgery. I also brought notification/report to Dr. Aurea Graff office and discussed w his surgical scheduler. Report is also viewable in EPIC.

## 2015-12-24 NOTE — Progress Notes (Signed)
Informed Gabriela Smith of surgery time change. Instructed patient to arrive to Short Stay at 0700. NPO after midnight. Sip of water only to take meds. Patient verbalized understanding.

## 2015-12-24 NOTE — Telephone Encounter (Signed)
New message  Pt cll to confimr procedure with with RN. Pt wanted o be sure that cardiologist was aware of her procedure on 10/17. Please call back to discuss

## 2015-12-24 NOTE — Telephone Encounter (Signed)
Called Dr. Percival Spanish - he is in meeting currently w/o access to chart, asked if this can be reviewed so we can clear patient for surgery tomorrow?

## 2015-12-24 NOTE — Telephone Encounter (Signed)
Pt waiting on Dr. Rosezella Florida clearance for the echo stress performed today. Notes this was for clearance for procedure tomorrow and she will need cardiology OK. Procedure tomorrow she's scheduled for has been bumped up to a 7am start time.  Aware I will route to provider for review.

## 2015-12-25 ENCOUNTER — Encounter (HOSPITAL_COMMUNITY)
Admission: RE | Disposition: A | Discharge status: Home Health | Payer: Self-pay | Source: Ambulatory Visit | Attending: Orthopedic Surgery

## 2015-12-25 ENCOUNTER — Inpatient Hospital Stay (HOSPITAL_COMMUNITY): Payer: Medicare Other

## 2015-12-25 ENCOUNTER — Inpatient Hospital Stay (HOSPITAL_COMMUNITY)
Admission: RE | Admit: 2015-12-25 | Discharge: 2015-12-26 | DRG: 470 | Disposition: A | Discharge status: Home Health | Payer: Medicare Other | Source: Ambulatory Visit | Attending: Orthopedic Surgery | Admitting: Orthopedic Surgery

## 2015-12-25 ENCOUNTER — Inpatient Hospital Stay (HOSPITAL_COMMUNITY): Payer: Medicare Other | Admitting: Anesthesiology

## 2015-12-25 ENCOUNTER — Encounter (HOSPITAL_COMMUNITY): Payer: Self-pay | Admitting: *Deleted

## 2015-12-25 DIAGNOSIS — I2510 Atherosclerotic heart disease of native coronary artery without angina pectoris: Secondary | ICD-10-CM | POA: Diagnosis present

## 2015-12-25 DIAGNOSIS — Z471 Aftercare following joint replacement surgery: Secondary | ICD-10-CM | POA: Diagnosis not present

## 2015-12-25 DIAGNOSIS — Z8551 Personal history of malignant neoplasm of bladder: Secondary | ICD-10-CM | POA: Diagnosis not present

## 2015-12-25 DIAGNOSIS — Z9861 Coronary angioplasty status: Secondary | ICD-10-CM

## 2015-12-25 DIAGNOSIS — E669 Obesity, unspecified: Secondary | ICD-10-CM | POA: Diagnosis present

## 2015-12-25 DIAGNOSIS — I10 Essential (primary) hypertension: Secondary | ICD-10-CM | POA: Diagnosis not present

## 2015-12-25 DIAGNOSIS — Z96652 Presence of left artificial knee joint: Secondary | ICD-10-CM | POA: Diagnosis present

## 2015-12-25 DIAGNOSIS — I714 Abdominal aortic aneurysm, without rupture: Secondary | ICD-10-CM | POA: Diagnosis present

## 2015-12-25 DIAGNOSIS — Z87891 Personal history of nicotine dependence: Secondary | ICD-10-CM | POA: Diagnosis not present

## 2015-12-25 DIAGNOSIS — M1611 Unilateral primary osteoarthritis, right hip: Principal | ICD-10-CM | POA: Diagnosis present

## 2015-12-25 DIAGNOSIS — E785 Hyperlipidemia, unspecified: Secondary | ICD-10-CM | POA: Diagnosis not present

## 2015-12-25 DIAGNOSIS — K219 Gastro-esophageal reflux disease without esophagitis: Secondary | ICD-10-CM | POA: Diagnosis not present

## 2015-12-25 DIAGNOSIS — M25551 Pain in right hip: Secondary | ICD-10-CM | POA: Diagnosis present

## 2015-12-25 DIAGNOSIS — Z96649 Presence of unspecified artificial hip joint: Secondary | ICD-10-CM

## 2015-12-25 DIAGNOSIS — Z6830 Body mass index (BMI) 30.0-30.9, adult: Secondary | ICD-10-CM | POA: Diagnosis not present

## 2015-12-25 DIAGNOSIS — Z96641 Presence of right artificial hip joint: Secondary | ICD-10-CM | POA: Diagnosis not present

## 2015-12-25 HISTORY — PX: TOTAL HIP ARTHROPLASTY: SHX124

## 2015-12-25 HISTORY — DX: Presence of unspecified artificial hip joint: Z96.649

## 2015-12-25 LAB — TYPE AND SCREEN
ABO/RH(D): O NEG
Antibody Screen: NEGATIVE

## 2015-12-25 SURGERY — ARTHROPLASTY, HIP, TOTAL, ANTERIOR APPROACH
Anesthesia: Monitor Anesthesia Care | Site: Hip | Laterality: Right

## 2015-12-25 MED ORDER — TRANEXAMIC ACID 1000 MG/10ML IV SOLN
1000.0000 mg | INTRAVENOUS | Status: AC
  Administered 2015-12-25: 1000 mg via INTRAVENOUS
  Filled 2015-12-25: qty 1100

## 2015-12-25 MED ORDER — BISACODYL 10 MG RE SUPP: 10.0000 mg | Freq: Every day | RECTAL | Status: DC | PRN

## 2015-12-25 MED ORDER — HYDROMORPHONE HCL 1 MG/ML IJ SOLN: 0.5000 mg | INTRAMUSCULAR | Status: DC | PRN

## 2015-12-25 MED ORDER — HYDROMORPHONE HCL 1 MG/ML IJ SOLN
INTRAMUSCULAR | Status: AC
  Filled 2015-12-25: qty 1

## 2015-12-25 MED ORDER — MENTHOL 3 MG MT LOZG
1.0000 | LOZENGE | OROMUCOSAL | Status: DC | PRN
Start: 2015-12-25 — End: 2015-12-26

## 2015-12-25 MED ORDER — PROMETHAZINE HCL 25 MG/ML IJ SOLN
6.2500 mg | INTRAMUSCULAR | Status: DC | PRN
  Administered 2015-12-25: 6.25 mg via INTRAVENOUS

## 2015-12-25 MED ORDER — ONDANSETRON HCL 4 MG PO TABS: 4.0000 mg | ORAL_TABLET | Freq: Four times a day (QID) | ORAL | Status: DC | PRN

## 2015-12-25 MED ORDER — ONDANSETRON HCL 4 MG/2ML IJ SOLN
INTRAMUSCULAR | Status: AC
  Filled 2015-12-25: qty 2

## 2015-12-25 MED ORDER — ONDANSETRON HCL 4 MG/2ML IJ SOLN: 4.0000 mg | Freq: Four times a day (QID) | INTRAMUSCULAR | Status: DC | PRN

## 2015-12-25 MED ORDER — POLYETHYLENE GLYCOL 3350 17 G PO PACK
17.0000 g | PACK | Freq: Two times a day (BID) | ORAL | Status: DC
  Administered 2015-12-26: 17 g via ORAL
  Filled 2015-12-25: qty 1

## 2015-12-25 MED ORDER — CEFAZOLIN SODIUM-DEXTROSE 2-4 GM/100ML-% IV SOLN
2.0000 g | INTRAVENOUS | Status: AC
  Administered 2015-12-25: 2 g via INTRAVENOUS
  Filled 2015-12-25: qty 100

## 2015-12-25 MED ORDER — ATORVASTATIN CALCIUM 20 MG PO TABS: 20.0000 mg | ORAL_TABLET | ORAL | Status: DC

## 2015-12-25 MED ORDER — METHOCARBAMOL 1000 MG/10ML IJ SOLN
500.0000 mg | Freq: Four times a day (QID) | INTRAVENOUS | Status: DC | PRN
  Administered 2015-12-25: 500 mg via INTRAVENOUS
  Filled 2015-12-25: qty 550
  Filled 2015-12-25: qty 5

## 2015-12-25 MED ORDER — FENTANYL CITRATE (PF) 100 MCG/2ML IJ SOLN
INTRAMUSCULAR | Status: DC | PRN
  Administered 2015-12-25: 50 ug via INTRAVENOUS

## 2015-12-25 MED ORDER — EPHEDRINE SULFATE-NACL 50-0.9 MG/10ML-% IV SOSY
PREFILLED_SYRINGE | INTRAVENOUS | Status: DC | PRN
  Administered 2015-12-25: 5 mg via INTRAVENOUS

## 2015-12-25 MED ORDER — PHENOL 1.4 % MT LIQD: 1.0000 | OROMUCOSAL | Status: DC | PRN

## 2015-12-25 MED ORDER — FENTANYL CITRATE (PF) 100 MCG/2ML IJ SOLN
INTRAMUSCULAR | Status: AC
  Filled 2015-12-25: qty 2

## 2015-12-25 MED ORDER — ALUM & MAG HYDROXIDE-SIMETH 200-200-20 MG/5ML PO SUSP: 30.0000 mL | ORAL | Status: DC | PRN

## 2015-12-25 MED ORDER — LACTATED RINGERS IV SOLN
INTRAVENOUS | Status: DC
  Administered 2015-12-25 (×2): via INTRAVENOUS

## 2015-12-25 MED ORDER — DEXAMETHASONE SODIUM PHOSPHATE 10 MG/ML IJ SOLN
10.0000 mg | Freq: Once | INTRAMUSCULAR | Status: AC
  Administered 2015-12-26: 10 mg via INTRAVENOUS
  Filled 2015-12-25: qty 1

## 2015-12-25 MED ORDER — HYDROCHLOROTHIAZIDE 12.5 MG PO CAPS
12.5000 mg | ORAL_CAPSULE | Freq: Every day | ORAL | Status: DC
Start: 2015-12-26 — End: 2015-12-26
  Administered 2015-12-26: 12.5 mg via ORAL
  Filled 2015-12-25: qty 1

## 2015-12-25 MED ORDER — SODIUM CHLORIDE 0.9 % IR SOLN
Status: DC | PRN
  Administered 2015-12-25: 1000 mL

## 2015-12-25 MED ORDER — DEXAMETHASONE SODIUM PHOSPHATE 10 MG/ML IJ SOLN
10.0000 mg | Freq: Once | INTRAMUSCULAR | Status: AC
  Administered 2015-12-25: 10 mg via INTRAVENOUS

## 2015-12-25 MED ORDER — METHOCARBAMOL 500 MG PO TABS
500.0000 mg | ORAL_TABLET | Freq: Four times a day (QID) | ORAL | Status: DC | PRN
  Administered 2015-12-26 (×2): 500 mg via ORAL
  Filled 2015-12-25 (×2): qty 1

## 2015-12-25 MED ORDER — ATORVASTATIN CALCIUM 20 MG PO TABS: 20.0000 mg | ORAL_TABLET | Freq: Every day | ORAL | Status: DC

## 2015-12-25 MED ORDER — EPHEDRINE 5 MG/ML INJ
INTRAVENOUS | Status: AC
  Filled 2015-12-25: qty 10

## 2015-12-25 MED ORDER — CEFAZOLIN SODIUM-DEXTROSE 2-4 GM/100ML-% IV SOLN
2.0000 g | Freq: Four times a day (QID) | INTRAVENOUS | Status: AC
  Administered 2015-12-25 (×2): 2 g via INTRAVENOUS
  Filled 2015-12-25 (×2): qty 100

## 2015-12-25 MED ORDER — SODIUM CHLORIDE 0.9 % IV SOLN
INTRAVENOUS | Status: DC
  Administered 2015-12-25: 100 mL/h via INTRAVENOUS
  Administered 2015-12-26: 03:00:00 via INTRAVENOUS

## 2015-12-25 MED ORDER — MAGNESIUM CITRATE PO SOLN: 1.0000 | Freq: Once | ORAL | Status: DC | PRN

## 2015-12-25 MED ORDER — HYDROMORPHONE HCL 1 MG/ML IJ SOLN
0.2500 mg | INTRAMUSCULAR | Status: DC | PRN
  Administered 2015-12-25: 0.5 mg via INTRAVENOUS
  Administered 2015-12-25 (×3): 0.25 mg via INTRAVENOUS

## 2015-12-25 MED ORDER — ONDANSETRON HCL 4 MG/2ML IJ SOLN
INTRAMUSCULAR | Status: DC | PRN
  Administered 2015-12-25: 4 mg via INTRAVENOUS

## 2015-12-25 MED ORDER — PHENYLEPHRINE 40 MCG/ML (10ML) SYRINGE FOR IV PUSH (FOR BLOOD PRESSURE SUPPORT)
PREFILLED_SYRINGE | INTRAVENOUS | Status: DC | PRN
  Administered 2015-12-25 (×5): 80 ug via INTRAVENOUS

## 2015-12-25 MED ORDER — FERROUS SULFATE 325 (65 FE) MG PO TABS
325.0000 mg | ORAL_TABLET | Freq: Three times a day (TID) | ORAL | Status: DC
  Administered 2015-12-26: 325 mg via ORAL
  Filled 2015-12-25 (×2): qty 1

## 2015-12-25 MED ORDER — PROMETHAZINE HCL 25 MG/ML IJ SOLN
INTRAMUSCULAR | Status: AC
  Administered 2015-12-25: 6.25 mg via INTRAVENOUS
  Filled 2015-12-25: qty 1

## 2015-12-25 MED ORDER — LIDOCAINE 2% (20 MG/ML) 5 ML SYRINGE
INTRAMUSCULAR | Status: DC | PRN
  Administered 2015-12-25: 100 mg via INTRAVENOUS

## 2015-12-25 MED ORDER — MIDAZOLAM HCL 5 MG/5ML IJ SOLN
INTRAMUSCULAR | Status: DC | PRN
  Administered 2015-12-25 (×2): 1 mg via INTRAVENOUS

## 2015-12-25 MED ORDER — BUPIVACAINE IN DEXTROSE 0.75-8.25 % IT SOLN
INTRATHECAL | Status: DC | PRN
  Administered 2015-12-25: 2 mL via INTRATHECAL

## 2015-12-25 MED ORDER — STERILE WATER FOR IRRIGATION IR SOLN
Status: DC | PRN
  Administered 2015-12-25: 2000 mL

## 2015-12-25 MED ORDER — PROPOFOL 10 MG/ML IV BOLUS
INTRAVENOUS | Status: AC
  Filled 2015-12-25: qty 40

## 2015-12-25 MED ORDER — ACETAMINOPHEN 500 MG PO TABS
1000.0000 mg | ORAL_TABLET | Freq: Three times a day (TID) | ORAL | Status: DC
  Administered 2015-12-25 – 2015-12-26 (×3): 1000 mg via ORAL
  Filled 2015-12-25 (×3): qty 2

## 2015-12-25 MED ORDER — NITROGLYCERIN 0.4 MG SL SUBL
0.4000 mg | SUBLINGUAL_TABLET | SUBLINGUAL | Status: DC | PRN
Start: 2015-12-25 — End: 2015-12-26

## 2015-12-25 MED ORDER — MIDAZOLAM HCL 2 MG/2ML IJ SOLN
INTRAMUSCULAR | Status: AC
  Filled 2015-12-25: qty 2

## 2015-12-25 MED ORDER — LIDOCAINE 2% (20 MG/ML) 5 ML SYRINGE
INTRAMUSCULAR | Status: AC
  Filled 2015-12-25: qty 5

## 2015-12-25 MED ORDER — DOCUSATE SODIUM 100 MG PO CAPS
100.0000 mg | ORAL_CAPSULE | Freq: Two times a day (BID) | ORAL | Status: DC
  Administered 2015-12-25 – 2015-12-26 (×2): 100 mg via ORAL
  Filled 2015-12-25 (×2): qty 1

## 2015-12-25 MED ORDER — DIPHENHYDRAMINE HCL 25 MG PO CAPS: 25.0000 mg | ORAL_CAPSULE | Freq: Four times a day (QID) | ORAL | Status: DC | PRN

## 2015-12-25 MED ORDER — ASPIRIN 81 MG PO CHEW
81.0000 mg | CHEWABLE_TABLET | Freq: Two times a day (BID) | ORAL | Status: DC
  Administered 2015-12-25 – 2015-12-26 (×2): 81 mg via ORAL
  Filled 2015-12-25 (×2): qty 1

## 2015-12-25 MED ORDER — METOCLOPRAMIDE HCL 5 MG/ML IJ SOLN: 5.0000 mg | Freq: Three times a day (TID) | INTRAMUSCULAR | Status: DC | PRN

## 2015-12-25 MED ORDER — PROPOFOL 500 MG/50ML IV EMUL
INTRAVENOUS | Status: DC | PRN
  Administered 2015-12-25: 25 ug/kg/min via INTRAVENOUS

## 2015-12-25 MED ORDER — DEXAMETHASONE SODIUM PHOSPHATE 10 MG/ML IJ SOLN
INTRAMUSCULAR | Status: AC
  Filled 2015-12-25: qty 1

## 2015-12-25 MED ORDER — CEFAZOLIN SODIUM-DEXTROSE 2-4 GM/100ML-% IV SOLN
INTRAVENOUS | Status: AC
  Filled 2015-12-25: qty 100

## 2015-12-25 MED ORDER — METOCLOPRAMIDE HCL 5 MG PO TABS: 5.0000 mg | ORAL_TABLET | Freq: Three times a day (TID) | ORAL | Status: DC | PRN

## 2015-12-25 MED ORDER — HYDROMORPHONE HCL 1 MG/ML IJ SOLN
INTRAMUSCULAR | Status: AC
  Administered 2015-12-25: 0.5 mg via INTRAVENOUS
  Filled 2015-12-25: qty 1

## 2015-12-25 MED ORDER — PHENYLEPHRINE 40 MCG/ML (10ML) SYRINGE FOR IV PUSH (FOR BLOOD PRESSURE SUPPORT)
PREFILLED_SYRINGE | INTRAVENOUS | Status: AC
  Filled 2015-12-25: qty 10

## 2015-12-25 MED ORDER — METOPROLOL TARTRATE 50 MG PO TABS
50.0000 mg | ORAL_TABLET | Freq: Two times a day (BID) | ORAL | Status: DC
  Administered 2015-12-25 – 2015-12-26 (×2): 50 mg via ORAL
  Filled 2015-12-25 (×2): qty 1

## 2015-12-25 MED ORDER — CHLORHEXIDINE GLUCONATE 4 % EX LIQD: 60.0000 mL | Freq: Once | CUTANEOUS | Status: DC

## 2015-12-25 MED ORDER — TRAMADOL HCL 50 MG PO TABS
50.0000 mg | ORAL_TABLET | Freq: Four times a day (QID) | ORAL | Status: DC
  Administered 2015-12-25 – 2015-12-26 (×4): 100 mg via ORAL
  Filled 2015-12-25 (×4): qty 2

## 2015-12-25 SURGICAL SUPPLY — 37 items
ADH SKN CLS APL DERMABOND .7 (GAUZE/BANDAGES/DRESSINGS) ×1
BAG SPEC THK2 15X12 ZIP CLS (MISCELLANEOUS) ×1
BAG ZIPLOCK 12X15 (MISCELLANEOUS) ×2 IMPLANT
CAPT HIP TOTAL 2 ×2 IMPLANT
CLOTH BEACON ORANGE TIMEOUT ST (SAFETY) ×2 IMPLANT
COVER PERINEAL POST (MISCELLANEOUS) ×2 IMPLANT
DERMABOND ADVANCED (GAUZE/BANDAGES/DRESSINGS) ×1
DERMABOND ADVANCED .7 DNX12 (GAUZE/BANDAGES/DRESSINGS) ×1 IMPLANT
DRAPE INCISE 23X17 IOBAN STRL (DRAPES) ×1
DRAPE INCISE IOBAN 23X17 STRL (DRAPES) ×1 IMPLANT
DRAPE SURG ISO 125X83 STRL (DRAPES) ×2 IMPLANT
DRAPE U-SHAPE 47X51 STRL (DRAPES) ×4 IMPLANT
DRESSING AQUACEL AG SP 3.5X10 (GAUZE/BANDAGES/DRESSINGS) ×1 IMPLANT
DRSG AQUACEL AG SP 3.5X10 (GAUZE/BANDAGES/DRESSINGS) ×2
DURAPREP 26ML APPLICATOR (WOUND CARE) ×2 IMPLANT
ELECT REM PT RETURN 9FT ADLT (ELECTROSURGICAL) ×2
ELECTRODE REM PT RTRN 9FT ADLT (ELECTROSURGICAL) ×1 IMPLANT
GLOVE BIOGEL PI IND STRL 7.5 (GLOVE) ×6 IMPLANT
GLOVE BIOGEL PI IND STRL 8 (GLOVE) ×1 IMPLANT
GLOVE BIOGEL PI INDICATOR 7.5 (GLOVE) ×6
GLOVE BIOGEL PI INDICATOR 8 (GLOVE) ×1
GLOVE SURG SS PI 7.5 STRL IVOR (GLOVE) ×8 IMPLANT
GLOVE SURG SS PI 8.0 STRL IVOR (GLOVE) ×4 IMPLANT
GOWN STRL REUS W/ TWL XL LVL3 (GOWN DISPOSABLE) ×1 IMPLANT
GOWN STRL REUS W/TWL LRG LVL3 (GOWN DISPOSABLE) ×2 IMPLANT
GOWN STRL REUS W/TWL XL LVL3 (GOWN DISPOSABLE) ×6 IMPLANT
HOLDER FOLEY CATH W/STRAP (MISCELLANEOUS) ×2 IMPLANT
PACK ANTERIOR HIP CUSTOM (KITS) ×2 IMPLANT
SAW OSC TIP CART 19.5X105X1.3 (SAW) ×2 IMPLANT
SUT MNCRL AB 4-0 PS2 18 (SUTURE) ×2 IMPLANT
SUT VIC AB 1 CT1 36 (SUTURE) ×6 IMPLANT
SUT VIC AB 2-0 CT1 27 (SUTURE) ×4
SUT VIC AB 2-0 CT1 TAPERPNT 27 (SUTURE) ×2 IMPLANT
SUT VLOC 180 0 24IN GS25 (SUTURE) ×2 IMPLANT
TRAY FOLEY BAG SILVER LF 16FR (SET/KITS/TRAYS/PACK) ×2 IMPLANT
TRAY FOLEY CATH SILVER 16FR LF (SET/KITS/TRAYS/PACK) ×2 IMPLANT
YANKAUER SUCT BULB TIP 10FT TU (MISCELLANEOUS) ×2 IMPLANT

## 2015-12-25 NOTE — Transfer of Care (Signed)
Immediate Anesthesia Transfer of Care Note  Patient: Gabriela Smith  Procedure(s) Performed: Procedure(s): RIGHT TOTAL HIP ARTHROPLASTY ANTERIOR APPROACH (Right)  Patient Location: PACU  Anesthesia Type:MAC and Spinal  Level of Consciousness: awake, alert , oriented and patient cooperative  Airway & Oxygen Therapy: Patient Spontanous Breathing and Patient connected to face mask oxygen  Post-op Assessment: Report given to RN and Post -op Vital signs reviewed and stable  Post vital signs: Reviewed and stable  Last Vitals:  Vitals:   12/25/15 0705  BP: (!) 191/88  Pulse: 85  Resp: 18  Temp: 36.8 C    Last Pain:  Vitals:   12/25/15 0705  TempSrc: Oral      Patients Stated Pain Goal: 4 ([id number])  Complications: No apparent anesthesia complications

## 2015-12-25 NOTE — Anesthesia Preprocedure Evaluation (Addendum)
Anesthesia Evaluation  Patient identified by MRN, date of birth, ID band Patient awake    Reviewed: Allergy & Precautions, NPO status , Patient's Chart, lab work & pertinent test results, reviewed documented beta blocker date and time   History of Anesthesia Complications Negative for: history of anesthetic complications  Airway Mallampati: II  TM Distance: >3 FB Neck ROM: Full    Dental no notable dental hx. (+) Dental Advisory Given   Pulmonary former smoker,    Pulmonary exam normal breath sounds clear to auscultation       Cardiovascular Exercise Tolerance: Good hypertension, Pt. on medications and Pt. on home beta blockers + CAD and + Peripheral Vascular Disease  Normal cardiovascular exam Rhythm:Regular Rate:Normal     Neuro/Psych negative neurological ROS  negative psych ROS   GI/Hepatic Neg liver ROS, GERD  Medicated,  Endo/Other  negative endocrine ROS  Renal/GU negative Renal ROS  negative genitourinary   Musculoskeletal  (+) Arthritis ,   Abdominal (+) + obese,   Peds negative pediatric ROS (+)  Hematology negative hematology ROS (+)   Anesthesia Other Findings   Reproductive/Obstetrics negative OB ROS                            Anesthesia Physical  Anesthesia Plan  ASA: III  Anesthesia Plan: MAC and Spinal   Post-op Pain Management:    Induction:   Airway Management Planned: Natural Airway and Simple Face Mask  Additional Equipment:   Intra-op Plan:   Post-operative Plan:   Informed Consent: I have reviewed the patients History and Physical, chart, labs and discussed the procedure including the risks, benefits and alternatives for the proposed anesthesia with the patient or authorized representative who has indicated his/her understanding and acceptance.   Dental advisory given  Plan Discussed with: CRNA  Anesthesia Plan Comments:        Anesthesia  Quick Evaluation

## 2015-12-25 NOTE — Anesthesia Procedure Notes (Signed)
Spinal  Start time: 12/25/2015 9:30 AM End time: 12/25/2015 9:40 AM Staffing Resident/CRNA: Pedro Whiters A Preanesthetic Checklist Completed: patient identified, site marked, surgical consent, pre-op evaluation, timeout performed, IV checked, risks and benefits discussed and monitors and equipment checked Spinal Block Patient position: sitting Prep: ChloraPrep Patient monitoring: heart rate Approach: midline Location: L2-3 Injection technique: single-shot Needle Needle type: Pencan  Needle gauge: 24 G Needle length: 10 cm Assessment Sensory level: T4 Additional Notes Pt placed in sitting position. Tolerated well. Spinal kit expiration kit checked and verified. + CSF, - heme.

## 2015-12-25 NOTE — Anesthesia Postprocedure Evaluation (Signed)
Anesthesia Post Note  Patient: Gabriela Smith  Procedure(s) Performed: Procedure(s) (LRB): RIGHT TOTAL HIP ARTHROPLASTY ANTERIOR APPROACH (Right)  Patient location during evaluation: PACU Anesthesia Type: Spinal and MAC Level of consciousness: awake and alert Pain management: pain level controlled Vital Signs Assessment: post-procedure vital signs reviewed and stable Respiratory status: spontaneous breathing and respiratory function stable Cardiovascular status: blood pressure returned to baseline and stable Postop Assessment: spinal receding Anesthetic complications: no    Last Vitals:  Vitals:   12/25/15 1130 12/25/15 1145  BP: 131/68 128/63  Pulse: 81 66  Resp: 14 13  Temp:      Last Pain:  Vitals:   12/25/15 1145  TempSrc:   PainSc: 0-No pain                 Alaisa Moffitt DANIEL

## 2015-12-25 NOTE — Interval H&P Note (Signed)
History and Physical Interval Note:  12/25/2015 8:49 AM  Gabriela Smith  has presented today for surgery, with the diagnosis of right hip OA  The various methods of treatment have been discussed with the patient and family. After consideration of risks, benefits and other options for treatment, the patient has consented to  Procedure(s): RIGHT TOTAL HIP ARTHROPLASTY ANTERIOR APPROACH (Right) as a surgical intervention .  The patient's history has been reviewed, patient examined, no change in status, stable for surgery.  I have reviewed the patient's chart and labs.  Questions were answered to the patient's satisfaction.     Mauri Pole

## 2015-12-25 NOTE — Evaluation (Signed)
Physical Therapy Evaluation Patient Details Name: Gabriela Smith MRN: [id number] DOB: [birth year] Today's Date: 12/25/2015   History of Present Illness  [name]  Clinical Impression  The patient tolerated  Taking a few steps today. Plans Dc to home. Pt admitted with above diagnosis. Pt currently with functional limitations due to the deficits listed below (see PT Problem List).  Pt will benefit from skilled PT to increase their independence and safety with mobility to allow discharge to the venue listed below.       Follow Up Recommendations Home health PT;Supervision/Assistance - 24 hour    Equipment Recommendations  None recommended by PT    Recommendations for Other Services       Precautions / Restrictions Precautions Precautions: Fall Restrictions Weight Bearing Restrictions: No      Mobility  Bed Mobility Overal bed mobility: Needs Assistance Bed Mobility: Supine to Sit;Sit to Supine     Supine to sit: Min assist Sit to supine: Min assist   General bed mobility comments: assist with the right leg  Transfers Overall transfer level: Needs assistance Equipment used: Rolling walker (2 wheeled) Transfers: Sit to/from Stand Sit to Stand: Min assist         General transfer comment: cues for hand placement  Ambulation/Gait Ambulation/Gait assistance: Min assist   Assistive device: Rolling walker (2 wheeled) Gait Pattern/deviations: Step-to pattern     General Gait Details: took 4 steps forwar d and back and 4 sideways  Stairs            Wheelchair Mobility    Modified Rankin (Stroke Patients Only)       Balance                                             Pertinent Vitals/Pain Pain Assessment: 0-10 Pain Score: 5  Pain Location: R thigh    Home Living Family/patient expects to be discharged to:: Private residence Living Arrangements: Spouse/significant other Available Help at Discharge: Family;Available 24  hours/day Type of Home: House Home Access: Stairs to enter Entrance Stairs-Rails: Left Entrance Stairs-Number of Steps: 2 Home Layout: Two level Home Equipment: Walker - 2 wheels;Cane - single point      Prior Function Level of Independence: Independent               Hand Dominance        Extremity/Trunk Assessment   Upper Extremity Assessment: Defer to OT evaluation           Lower Extremity Assessment: RLE deficits/detail RLE Deficits / Details: able to take a step with the leg       Communication   Communication: No difficulties  Cognition Arousal/Alertness: Awake/alert Behavior During Therapy: WFL for tasks assessed/performed Overall Cognitive Status: Within Functional Limits for tasks assessed                      General Comments      Exercises     Assessment/Plan    PT Assessment Patient needs continued PT services  PT Problem List Decreased strength;Decreased range of motion;Decreased activity tolerance;Decreased mobility;Decreased knowledge of use of DME;Decreased safety awareness;Decreased knowledge of precautions          PT Treatment Interventions DME instruction;Gait training;Stair training;Functional mobility training;Therapeutic activities;Therapeutic exercise;Patient/family education    PT Goals (Current goals can be found in the Care Plan section)  Acute Rehab PT Goals Patient Stated Goal: to go home PT Goal Formulation: With patient/family Time For Goal Achievement: 12/28/15 Potential to Achieve Goals: Good    Frequency 7X/week   Barriers to discharge        Co-evaluation               End of Session   Activity Tolerance: Patient tolerated treatment well Patient left: in bed;with call bell/phone within reach;with family/visitor present Nurse Communication: Mobility status         Time: [id number] PT Time Calculation (min) (ACUTE ONLY): 30 min   Charges:   PT Evaluation $PT Eval Low Complexity: 1  Procedure PT Treatments $Gait Training: 8-22 mins   PT G Codes:        Claretha Cooper 12/25/2015, 6:21 PM

## 2015-12-25 NOTE — Op Note (Signed)
NAME:  Gabriela Smith                ACCOUNT NO.: [id number]      MEDICAL RECORD NO.: [id number]      FACILITY:  [facility]      PHYSICIAN:  Paralee Cancel D  DATE OF BIRTH:  01-Jul-1940     DATE OF PROCEDURE:  12/25/2015                                 OPERATIVE REPORT         PREOPERATIVE DIAGNOSIS: Right  hip osteoarthritis.      POSTOPERATIVE DIAGNOSIS:  Right hip osteoarthritis.      PROCEDURE:  Right total hip replacement through an anterior approach   utilizing DePuy THR system, component size 40mm pinnacle cup, a size 32+4 neutral   Altrex liner, a size 4 Hi Tri Lock stem with a 32+1 delta ceramic   ball.      SURGEON:  Pietro Cassis. Alvan Dame, M.D.      ASSISTANT:  Molli Barrows, PA-C     ANESTHESIA:  Spinal.      SPECIMENS:  None.      COMPLICATIONS:  None.      BLOOD LOSS:  350 cc     DRAINS:  None.      INDICATION OF THE PROCEDURE:  Gabriela Smith is a 75 y.o. female who had   presented to office for evaluation of right hip pain.  Radiographs revealed   progressive degenerative changes with bone-on-bone   articulation to the  hip joint.  The patient had painful limited range of   motion significantly affecting their overall quality of life.  The patient was failing to    respond to conservative measures, and at this point was ready   to proceed with more definitive measures.  The patient has noted progressive   degenerative changes in his hip, progressive problems and dysfunction   with regarding the hip prior to surgery.  Consent was obtained for   benefit of pain relief.  Specific risk of infection, DVT, component   failure, dislocation, need for revision surgery, as well discussion of   the anterior versus posterior approach were reviewed.  Consent was   obtained for benefit of anterior pain relief through an anterior   approach.      PROCEDURE IN DETAIL:  The patient was brought to operative theater.   Once adequate anesthesia, preoperative  antibiotics, 2gm of Ancef, 1 gm of Tranexamic Acid, and 10 mg of Decadron administered.   The patient was positioned supine on the OSI Hanna table.  Once adequate   padding of boney process was carried out, we had predraped out the hip, and  used fluoroscopy to confirm orientation of the pelvis and position.      The right hip was then prepped and draped from proximal iliac crest to   mid thigh with shower curtain technique.      Time-out was performed identifying the patient, planned procedure, and   extremity.     An incision was then made 2 cm distal and lateral to the   anterior superior iliac spine extending over the orientation of the   tensor fascia lata muscle and sharp dissection was carried down to the   fascia of the muscle and protractor placed in the soft tissues.      The fascia was  then incised.  The muscle belly was identified and swept   laterally and retractor placed along the superior neck.  Following   cauterization of the circumflex vessels and removing some pericapsular   fat, a second cobra retractor was placed on the inferior neck.  A third   retractor was placed on the anterior acetabulum after elevating the   anterior rectus.  A L-capsulotomy was along the line of the   superior neck to the trochanteric fossa, then extended proximally and   distally.  Tag sutures were placed and the retractors were then placed   intracapsular.  We then identified the trochanteric fossa and   orientation of my neck cut, confirmed this radiographically   and then made a neck osteotomy with the femur on traction.  The femoral   head was removed without difficulty or complication.  Traction was let   off and retractors were placed posterior and anterior around the   acetabulum.      The labrum and foveal tissue were debrided.  I began reaming with a 43mm   reamer and reamed up to 63mm reamer with good bony bed preparation and a 39mm   cup was chosen.  The final 41mm Pinnacle cup  was then impacted under fluoroscopy  to confirm the depth of penetration and orientation with respect to   abduction.  A screw was placed followed by the hole eliminator.  The final   32+1 neutral Altrex liner was impacted with good visualized rim fit.  The cup was positioned anatomically within the acetabular portion of the pelvis.      At this point, the femur was rolled at 80 degrees.  Further capsule was   released off the inferior aspect of the femoral neck.  I then   released the superior capsule proximally.  The hook was placed laterally   along the femur and elevated manually and held in position with the bed   hook.  The leg was then extended and adducted with the leg rolled to 100   degrees of external rotation.  Once the proximal femur was fully   exposed, I used a box osteotome to set orientation.  I then began   broaching with the starting chili pepper broach and passed this by hand and then broached up to 4.  With the 4 broach in place I chose a high offset neck and did trial reductions.  The offset was appropriate, leg lengths   appeared to be equal best matched with the +1 head ball confirmed radiographically.   Given these findings, I went ahead and dislocated the hip, repositioned all   retractors and positioned the right hip in the extended and abducted position.  The final 4 Hi Tri Lock stem was   chosen and it was impacted down to the level of neck cut.  Based on this   and the trial reduction, a 32+1 delta ceramic ball was chosen and   impacted onto a clean and dry trunnion, and the hip was reduced.  The   hip had been irrigated throughout the case again at this point.  I did   reapproximate the superior capsular leaflet to the anterior leaflet   using #1 Vicryl.  The fascia of the   tensor fascia lata muscle was then reapproximated using #1 Vicryl and #1 Stratafix sutures.  The   remaining wound was closed with 2-0 Vicryl and running 4-0 Monocryl.   The hip was cleaned,  dried, and dressed  sterilely using Dermabond and   Aquacel dressing.  She was then brought   to recovery room in stable condition tolerating the procedure well.    Molli Barrows, PA-C was present for the entirety of the case involved from   preoperative positioning, perioperative retractor management, general   facilitation of the case, as well as primary wound closure as assistant.            Pietro Cassis Alvan Dame, M.D.        12/25/2015 10:58 AM

## 2015-12-26 ENCOUNTER — Encounter (HOSPITAL_COMMUNITY): Payer: Self-pay | Admitting: Orthopedic Surgery

## 2015-12-26 LAB — BASIC METABOLIC PANEL
Anion gap: 6 (ref 5–15)
BUN: 10 mg/dL (ref 6–20)
CO2: 24 mmol/L (ref 22–32)
Calcium: 8.6 mg/dL — ABNORMAL LOW (ref 8.9–10.3)
Chloride: 108 mmol/L (ref 101–111)
Creatinine, Ser: 0.61 mg/dL (ref 0.44–1.00)
GFR calc Af Amer: >60 mL/min (ref >60)
GFR calc non Af Amer: >60 mL/min (ref >60)
Glucose, Bld: 117 mg/dL — ABNORMAL HIGH (ref 65–99)
Potassium: 3.8 mmol/L (ref 3.5–5.1)
Sodium: 138 mmol/L (ref 135–145)

## 2015-12-26 LAB — CBC
HCT: 28.7 % — ABNORMAL LOW (ref 36.0–46.0)
Hemoglobin: 9.5 g/dL — ABNORMAL LOW (ref 12.0–15.0)
MCH: 31.9 pg (ref 26.0–34.0)
MCHC: 33.1 g/dL (ref 30.0–36.0)
MCV: 96.3 fL (ref 78.0–100.0)
Platelets: 192 10*3/uL (ref 150–400)
RBC: 2.98 MIL/uL — ABNORMAL LOW (ref 3.87–5.11)
RDW: 13.7 % (ref 11.5–15.5)
WBC: 7 10*3/uL (ref 4.0–10.5)

## 2015-12-26 MED ORDER — DOCUSATE SODIUM 100 MG PO CAPS: 100.0000 mg | ORAL_CAPSULE | Freq: Two times a day (BID) | ORAL | 0 refills | Status: DC

## 2015-12-26 MED ORDER — METHOCARBAMOL 500 MG PO TABS: 500.0000 mg | ORAL_TABLET | Freq: Four times a day (QID) | ORAL | 0 refills | Status: DC | PRN

## 2015-12-26 MED ORDER — ACETAMINOPHEN 500 MG PO TABS: 1000.0000 mg | ORAL_TABLET | Freq: Three times a day (TID) | ORAL | 0 refills | Status: AC

## 2015-12-26 MED ORDER — ASPIRIN 81 MG PO CHEW: 81.0000 mg | CHEWABLE_TABLET | Freq: Two times a day (BID) | ORAL | 0 refills | Status: AC

## 2015-12-26 MED ORDER — TRAMADOL HCL 50 MG PO TABS: 50.0000 mg | ORAL_TABLET | Freq: Four times a day (QID) | ORAL | 0 refills | Status: DC | PRN

## 2015-12-26 MED ORDER — FERROUS SULFATE 325 (65 FE) MG PO TABS: 325.0000 mg | ORAL_TABLET | Freq: Three times a day (TID) | ORAL | 3 refills | Status: DC

## 2015-12-26 MED ORDER — POLYETHYLENE GLYCOL 3350 17 G PO PACK: 17.0000 g | PACK | Freq: Two times a day (BID) | ORAL | 0 refills | Status: DC

## 2015-12-26 NOTE — Progress Notes (Addendum)
Physical Therapy Treatment Patient Details Name: Gabriela Smith MRN: [id number] DOB: [birth year] Today's Date: 12/26/2015    History of Present Illness [name]    PT Comments    The  Patient is progressing well. Plans DC later today.  Follow Up Recommendations  Home health PT;Supervision/Assistance - 24 hour     Equipment Recommendations  None recommended by PT    Recommendations for Other Services       Precautions / Restrictions Precautions Precautions: Fall Restrictions Weight Bearing Restrictions: No Other Position/Activity Restrictions: WBAT    Mobility  Bed Mobility Overal bed mobility: Needs Assistance Bed Mobility: Supine to Sit;Sit to Supine     Supine to sit: Min assist Sit to supine: Min assist   General bed mobility comments: assist with the right leg  Transfers Overall transfer level: Needs assistance Equipment used: Rolling walker (2 wheeled) Transfers: Sit to/from Stand Sit to Stand: Supervision         General transfer comment: cues for hand placement  Ambulation/Gait Ambulation/Gait assistance: Min guard Ambulation Distance (Feet): 80 Feet Assistive device: Rolling walker (2 wheeled) Gait Pattern/deviations: Step-to pattern;Step-through pattern;Decreased stance time - right;Decreased step length - right;Antalgic     General Gait Details: cues for sequence   Stairs-practiced 2 forward with cane and a rail.            Wheelchair Mobility    Modified Rankin (Stroke Patients Only)       Balance                                    Cognition Arousal/Alertness: Awake/alert Behavior During Therapy: WFL for tasks assessed/performed Overall Cognitive Status: Within Functional Limits for tasks assessed                      Exercises Total Joint Exercises Ankle Circles/Pumps: AROM;Both;10 reps Quad Sets: AROM;Both;10 reps Short Arc Quad: AROM;Right;10 reps Heel Slides: AAROM;Right;10 reps Hip  ABduction/ADduction: AAROM;Right;10 reps    General Comments        Pertinent Vitals/Pain Pain Assessment: 0-10 Pain Score: 2  Pain Location: R hip Pain Descriptors / Indicators: Sore Pain Intervention(s): Limited activity within patient's tolerance;Premedicated before session;Ice applied    Home Living Family/patient expects to be discharged to:: Private residence Living Arrangements: Spouse/significant other Available Help at Discharge: Family;Available 24 hours/day Type of Home: House Home Access: Stairs to enter Entrance Stairs-Rails: Left Home Layout: Two level Home Equipment: Walker - 2 wheels;Cane - single point;Bedside commode;Shower seat      Prior Function Level of Independence: Independent          PT Goals (current goals can now be found in the care plan section) Acute Rehab PT Goals Patient Stated Goal: to go home Progress towards PT goals: Progressing toward goals    Frequency    7X/week      PT Plan Current plan remains appropriate    Co-evaluation             End of Session   Activity Tolerance: Patient tolerated treatment well Patient left: in bed;with call bell/phone within reach;with family/visitor present     Time: [id number] PT Time Calculation (min) (ACUTE ONLY): 39 min  Charges:  $Gait Training: 8-22 mins $Therapeutic Exercise: 8-22 mins $Self Care/Home Management: 8-22                    G Codes:  Claretha Cooper 12/26/2015, 1:01 PM Tresa Endo PT [phone]

## 2015-12-26 NOTE — Progress Notes (Signed)
Physical Therapy Treatment Patient Details Name: Gabriela Smith MRN: [id number] DOB: Nov 30, 1940 Today's Date: 12/26/2015    History of Present Illness [name]    PT Comments    The patient is more painful so did not push ambulation. She will purchase a leg lifter. Plans Dc today.  Follow Up Recommendations  Home health PT;Supervision/Assistance - 24 hour     Equipment Recommendations  None recommended by PT    Recommendations for Other Services       Precautions / Restrictions Precautions Precautions: Fall Restrictions Weight Bearing Restrictions: No Other Position/Activity Restrictions: WBAT    Mobility  Bed Mobility Overal bed mobility: Needs Assistance Bed Mobility: Supine to Sit;Sit to Supine     Supine to sit: Min assist Sit to supine: Supervision   General bed mobility comments: use of leg lifter to place leg onto and off the bed.  Transfers Overall transfer level: Needs assistance Equipment used: Rolling walker (2 wheeled) Transfers: Sit to/from Stand Sit to Stand: Supervision         General transfer comment: cues for hand placement      General Gait Details: did not ambulate due to increased pain. Plans DC in a short time.    Stairs            Wheelchair Mobility    Modified Rankin (Stroke Patients Only)       Balance                                    Cognition Arousal/Alertness: Awake/alert Behavior During Therapy: WFL for tasks assessed/performed Overall Cognitive Status: Within Functional Limits for tasks assessed                      Exercises   General Comments        Pertinent Vitals/Pain Pain Assessment: 0-10 Pain Score: 7  Pain Location: right thigh Pain Descriptors / Indicators: Burning Pain Intervention(s): Limited activity within patient's tolerance;Monitored during session;Ice applied;Repositioned    Home Living Family/patient expects to be discharged to:: Private  residence Living Arrangements: Spouse/significant other Available Help at Discharge: Family;Available 24 hours/day Type of Home: House Home Access: Stairs to enter Entrance Stairs-Rails: Left Home Layout: Two level Home Equipment: Walker - 2 wheels;Cane - single point;Bedside commode;Shower seat      Prior Function Level of Independence: Independent          PT Goals (current goals can now be found in the care plan section) Acute Rehab PT Goals Patient Stated Goal: to go home Progress towards PT goals: Progressing toward goals    Frequency    7X/week      PT Plan Current plan remains appropriate    Co-evaluation             End of Session   Activity Tolerance: Patient limited by pain Patient left: in bed;with call bell/phone within reach;with family/visitor present     Time: [id number] PT Time Calculation (min) (ACUTE ONLY): 18 min  Charges:  Self care 1 8-22 mins                    G Codes:      Claretha Cooper 12/26/2015, 2:48 PM Tresa Endo PT [phone]

## 2015-12-26 NOTE — Progress Notes (Addendum)
     Subjective: 1 Day Post-Op Procedure(s) (LRB): RIGHT TOTAL HIP ARTHROPLASTY ANTERIOR APPROACH (Right)   Patient reports pain as mild, pain well controlled. No events throughout the night.  Ready to be discharged home if she does well with PT.   Objective:   VITALS:   Vitals:   12/26/15 0220 12/26/15 0538  BP: 129/79 (!) 131/57  Pulse: 70 78  Resp: 16 16  Temp: 97.9 F (36.6 C) 97.9 F (36.6 C)    Dorsiflexion/Plantar flexion intact Incision: dressing C/D/I No cellulitis present Compartment soft  LABS  Recent Labs  12/26/15 0419  HGB 9.5*  HCT 28.7*  WBC 7.0  PLT 192     Recent Labs  12/26/15 0419  NA 138  K 3.8  BUN 10  CREATININE 0.61  GLUCOSE 117*     Assessment/Plan: 1 Day Post-Op Procedure(s) (LRB): RIGHT TOTAL HIP ARTHROPLASTY ANTERIOR APPROACH (Right) Foley cath d/c'ed Advance diet Up with therapy D/C IV fluids Discharge home with home health  Follow up in 2 weeks at [facility]. Follow up with OLIN,Chiquitta Matty D in 2 weeks.  Contact information:  [facility] [address] [place] [id number]    Obese (BMI 30-39.9) Estimated body mass index is 30.38 kg/m as calculated from the following:   Height as of this encounter: 5\' 4"  (1.626 m).   Weight as of this encounter: 80.3 kg (177 lb). Patient also counseled that weight may inhibit the healing process Patient counseled that losing weight will help with future health issues         West Pugh. Leroy Trim   PAC  12/26/2015, 9:18 AM

## 2015-12-26 NOTE — Care Management Note (Signed)
Case Management Note  Patient Details  Name: Gabriela Smith MRN: [id number] Date of Birth: 10/07/1940  Subjective/Objective:                  RIGHT TOTAL HIP ARTHROPLASTY ANTERIOR APPROACH (Right)  Action/Plan: Discharge planning Expected Discharge Date:  12/26/15              Expected Discharge Plan:  Home w Home Health Services  In-House Referral:     Discharge planning Services  CM Consult  Post Acute Care Choice:  Home Health Choice offered to:  Patient  DME Arranged:  N/A DME Agency:  NA  HH Arranged:  PT HH Agency:  [facility] (formerly [facility])  Status of Service:  Completed, signed off  If discussed at Long Length of Stay Meetings, dates discussed:    Additional Comments: CM met with pt in room to offer choice of home health agency.  Pt chooses [facility] to render HHPT.  Referral given to [facility] rep, Tim.  Pt states she has both rolling walker and 3n1 at home.  No other CM needs were communicated. ,  Christine, RN 12/26/2015, 11:04 AM  

## 2015-12-26 NOTE — Progress Notes (Signed)
Occupational Therapy Evaluation Patient Details Name: Gabriela Smith MRN: [id number] DOB: [birth year] Today's Date: 12/26/2015    History of Present Illness [name]   Clinical Impression   All OT education completed and pt questions answered. No further OT needs identified; will sign off.    Follow Up Recommendations  No OT follow up;Supervision - Intermittent    Equipment Recommendations  None recommended by OT    Recommendations for Other Services       Precautions / Restrictions Precautions Precautions: Fall Restrictions Weight Bearing Restrictions: No Other Position/Activity Restrictions: WBAT      Mobility Bed Mobility Overal bed mobility: Needs Assistance Bed Mobility: Supine to Sit     Supine to sit: Min guard        Transfers Overall transfer level: Needs assistance Equipment used: Rolling walker (2 wheeled) Transfers: Sit to/from Stand Sit to Stand: Supervision         General transfer comment: cues for hand placement    Balance                                            ADL Overall ADL's : Needs assistance/impaired     Grooming: Supervision/safety;Standing           Upper Body Dressing : Set up;Sitting   Lower Body Dressing: Minimal assistance;Sit to/from stand   Toilet Transfer: Supervision/safety;Ambulation;BSC;RW   Toileting- Clothing Manipulation and Hygiene: Supervision/safety;Sit to/from stand   Tub/ Shower Transfer: Walk-in shower;Min guard;Ambulation;Shower seat;Rolling walker   Functional mobility during ADLs: Supervision/safety;Rolling walker       Vision     Perception     Praxis      Pertinent Vitals/Pain Pain Assessment: 0-10 Pain Score: 0-No pain     Hand Dominance     Extremity/Trunk Assessment Upper Extremity Assessment Upper Extremity Assessment: Overall WFL for tasks assessed   Lower Extremity Assessment Lower Extremity Assessment: Defer to PT evaluation        Communication Communication Communication: No difficulties   Cognition Arousal/Alertness: Awake/alert Behavior During Therapy: WFL for tasks assessed/performed Overall Cognitive Status: Within Functional Limits for tasks assessed                     General Comments       Exercises       Shoulder Instructions      Home Living Family/patient expects to be discharged to:: Private residence Living Arrangements: Spouse/significant other Available Help at Discharge: Family;Available 24 hours/day Type of Home: House Home Access: Stairs to enter [employer] of Steps: 2 Entrance Stairs-Rails: Left Home Layout: Two level Alternate Level Stairs-Number of Steps: stair glide   Bathroom Shower/Tub: Walk-in shower   Bathroom Toilet: Handicapped height     Home Equipment: Environmental consultant - 2 wheels;Cane - single point;Bedside commode;Shower seat          Prior Functioning/Environment Level of Independence: Independent                 OT Problem List:     OT Treatment/Interventions:      OT Goals(Current goals can be found in the care plan section) Acute Rehab OT Goals Patient Stated Goal: to go home OT Goal Formulation: All assessment and education complete, DC therapy  OT Frequency:     Barriers to D/C:            Co-evaluation  End of Session Equipment Utilized During Treatment: Rolling walker  Activity Tolerance: Patient tolerated treatment well Patient left: in bed;Other (comment) (sitting EOB waiting for physical therapy)   Time: [id number] OT Time Calculation (min): 20 min Charges:  OT General Charges $OT Visit: 1 Procedure OT Evaluation $OT Eval Low Complexity: 1 Procedure G-Codes:    Celines Femia A 01-18-16, 12:08 PM

## 2015-12-28 DIAGNOSIS — Z96641 Presence of right artificial hip joint: Secondary | ICD-10-CM | POA: Diagnosis not present

## 2015-12-28 DIAGNOSIS — E785 Hyperlipidemia, unspecified: Secondary | ICD-10-CM | POA: Diagnosis not present

## 2015-12-28 DIAGNOSIS — I714 Abdominal aortic aneurysm, without rupture: Secondary | ICD-10-CM | POA: Diagnosis not present

## 2015-12-28 DIAGNOSIS — M5136 Other intervertebral disc degeneration, lumbar region: Secondary | ICD-10-CM | POA: Diagnosis not present

## 2015-12-28 DIAGNOSIS — M8580 Other specified disorders of bone density and structure, unspecified site: Secondary | ICD-10-CM | POA: Diagnosis not present

## 2015-12-28 DIAGNOSIS — Z8551 Personal history of malignant neoplasm of bladder: Secondary | ICD-10-CM | POA: Diagnosis not present

## 2015-12-28 DIAGNOSIS — I2510 Atherosclerotic heart disease of native coronary artery without angina pectoris: Secondary | ICD-10-CM | POA: Diagnosis not present

## 2015-12-28 DIAGNOSIS — I10 Essential (primary) hypertension: Secondary | ICD-10-CM | POA: Diagnosis not present

## 2015-12-28 DIAGNOSIS — Z96652 Presence of left artificial knee joint: Secondary | ICD-10-CM | POA: Diagnosis not present

## 2015-12-28 DIAGNOSIS — Z471 Aftercare following joint replacement surgery: Secondary | ICD-10-CM | POA: Diagnosis not present

## 2015-12-28 DIAGNOSIS — Z7982 Long term (current) use of aspirin: Secondary | ICD-10-CM | POA: Diagnosis not present

## 2015-12-28 NOTE — Discharge Summary (Signed)
Physician Discharge Summary  Patient ID: Gabriela Smith MRN: [id number] DOB/AGE: 04-09-1940 75 y.o.  Admit date: 12/25/2015 Discharge date: 12/26/2015   Procedures:  Procedure(s) (LRB): RIGHT TOTAL HIP ARTHROPLASTY ANTERIOR APPROACH (Right)  Attending Physician:  Dr. Paralee Cancel   Admission Diagnoses:   Right hip primary OA /pain  Discharge Diagnoses:  Principal Problem:   S/P right THA, AA Active Problems:   S/P hip replacement  Past Medical History:  Diagnosis Date  . AAA (abdominal aortic aneurysm) [facility])    cardiology aware   . Arthritis    OA lt knee- cortizone inj. q 4 months AND OA ALSO IN BACK. Had knee replaced-issue resolved  . CAD (coronary artery disease) CARDIOLOGIST- [facility]--  VISIT 01-02-10 IN EPIC   1993-- PTCA. Pt describes a near total blockage of apparently the LAD and a 65% stenosis elsewhere.  . Cancer ([place])   . History of bladder cancer followed by dr Karsten Ro   hx  TCC of bladder ,  Ta G3-  first occurence 02-20-2012--  s/p BCG tx's  and  mitomycin C  . Hyperlipidemia 1993  . Hypertension 1993  . Nocturia   . Osteopenia   . Sigmoid diverticulosis     HPI:    Gabriela Smith, 75 y.o. female, has a history of pain and functional disability in the right hip(s) due to arthritis and patient has failed non-surgical conservative treatments for greater than 12 weeks to include NSAID's and/or analgesics, corticosteriod injections and activity modification.  Onset of symptoms was gradual starting 1+ years ago with gradually worsening course since that time.The patient noted no past surgery on the right hip(s).  Patient currently rates pain in the right hip at 10 out of 10 with activity. Patient has night pain, worsening of pain with activity and weight bearing, trendelenberg gait, pain that interfers with activities of daily living and pain with passive range of motion. Patient has evidence of periarticular osteophytes and joint space narrowing by imaging  studies. This condition presents safety issues increasing the risk of falls.  There is no current active infection.   Risks, benefits and expectations were discussed with the patient.  Risks including but not limited to the risk of anesthesia, blood clots, nerve damage, blood vessel damage, failure of the prosthesis, infection and up to and including death.  Patient understand the risks, benefits and expectations and wishes to proceed with surgery.   PCP: Redge Gainer, MD   Discharged Condition: good  Hospital Course:  Patient underwent the above stated procedure on 12/25/2015. Patient tolerated the procedure well and brought to the recovery room in good condition and subsequently to the floor.  POD #1 BP: 131/57 ; Pulse: 77 ; Temp: 97.9 F (36.6 C) ; Resp: 16 Patient reports pain as mild, pain well controlled. No events throughout the night.  Ready to be discharged home. Dorsiflexion/plantar flexion intact, incision: dressing C/D/I, no cellulitis present and compartment soft.   LABS  Basename    HGB     9.5  HCT     28.7     Discharge Exam: General appearance: alert, cooperative and no distress Extremities: Homans sign is negative, no sign of DVT, no edema, redness or tenderness in the calves or thighs and no ulcers, gangrene or trophic changes  Disposition: Home with follow up in 2 weeks   Follow-up Information    [facility] .   Specialty:  Home Health Services Why:  home health physical therapy Contact information:  [address] [place] [address] [phone]           Discharge Instructions    Call MD / Call 911    Complete by:  As directed    If you experience chest pain or shortness of breath, CALL 911 and be transported to the hospital emergency room.  If you develope a fever above 101 F, pus (white drainage) or increased drainage or redness at the wound, or calf pain, call your surgeon's office.   Change dressing    Complete by:  As directed     Maintain surgical dressing until follow up in the clinic. If the edges start to pull up, may reinforce with tape. If the dressing is no longer working, may remove and cover with gauze and tape, but must keep the area dry and clean.  Call with any questions or concerns.   Constipation Prevention    Complete by:  As directed    Drink plenty of fluids.  Prune juice may be helpful.  You may use a stool softener, such as Colace (over the counter) 100 mg twice a day.  Use MiraLax (over the counter) for constipation as needed.   Diet - low sodium heart healthy    Complete by:  As directed    Discharge instructions    Complete by:  As directed    Maintain surgical dressing until follow up in the clinic. If the edges start to pull up, may reinforce with tape. If the dressing is no longer working, may remove and cover with gauze and tape, but must keep the area dry and clean.  Follow up in 2 weeks at [facility]. Call with any questions or concerns.   Increase activity slowly as tolerated    Complete by:  As directed    Weight bearing as tolerated with assist device (walker, cane, etc) as directed, use it as long as suggested by your surgeon or therapist, typically at least 4-6 weeks.   TED hose    Complete by:  As directed    Use stockings (TED hose) for 2 weeks on both leg(s).  You may remove them at night for sleeping.        Medication List    STOP taking these medications   ciprofloxacin 500 MG tablet Commonly known as:  CIPRO     TAKE these medications   acetaminophen 500 MG tablet Commonly known as:  TYLENOL Take 2 tablets (1,000 mg total) by mouth every 8 (eight) hours. What changed:  when to take this  reasons to take this   aspirin 81 MG chewable tablet Chew 1 tablet (81 mg total) by mouth 2 (two) times daily. Take for 4 weeks.   atorvastatin 20 MG tablet Commonly known as:  LIPITOR Take 1 tablet (20 mg total) by mouth daily. What changed:  See the new  instructions.   cholecalciferol 1000 units tablet Commonly known as:  VITAMIN D Take 1,000 Units by mouth daily.   Co-Enzyme Q10 200 MG Caps Take 1 capsule by mouth daily.   docusate sodium 100 MG capsule Commonly known as:  COLACE Take 1 capsule (100 mg total) by mouth 2 (two) times daily.   ferrous sulfate 325 (65 FE) MG tablet Take 1 tablet (325 mg total) by mouth 3 (three) times daily after meals.   Flax Seed Oil 1000 MG Caps Take 1 capsule by mouth daily.   hydrochlorothiazide 12.5 MG capsule Commonly known as:  MICROZIDE TAKE (  1) CAPSULE DAILY   MELATONEX PO Take 3 mg by mouth at bedtime as needed (sleep).   methocarbamol 500 MG tablet Commonly known as:  ROBAXIN Take 1 tablet (500 mg total) by mouth every 6 (six) hours as needed for muscle spasms.   metoprolol 50 MG tablet Commonly known as:  LOPRESSOR TAKE  (1)  TABLET TWICE A DAY.   nitroGLYCERIN 0.4 MG SL tablet Commonly known as:  NITROSTAT Place 1 tablet (0.4 mg total) under the tongue every 5 (five) minutes as needed for chest pain.   polyethylene glycol packet Commonly known as:  MIRALAX / GLYCOLAX Take 17 g by mouth 2 (two) times daily.   traMADol 50 MG tablet Commonly known as:  ULTRAM Take 1-2 tablets (50-100 mg total) by mouth every 6 (six) hours as needed. What changed:  how much to take  when to take this        Signed: West Pugh. Gillie Crisci   PA-C  12/28/2015, 8:59 AM

## 2015-12-31 DIAGNOSIS — Z96641 Presence of right artificial hip joint: Secondary | ICD-10-CM | POA: Diagnosis not present

## 2015-12-31 DIAGNOSIS — Z7982 Long term (current) use of aspirin: Secondary | ICD-10-CM | POA: Diagnosis not present

## 2015-12-31 DIAGNOSIS — I10 Essential (primary) hypertension: Secondary | ICD-10-CM | POA: Diagnosis not present

## 2015-12-31 DIAGNOSIS — I714 Abdominal aortic aneurysm, without rupture: Secondary | ICD-10-CM | POA: Diagnosis not present

## 2015-12-31 DIAGNOSIS — I2510 Atherosclerotic heart disease of native coronary artery without angina pectoris: Secondary | ICD-10-CM | POA: Diagnosis not present

## 2015-12-31 DIAGNOSIS — Z96652 Presence of left artificial knee joint: Secondary | ICD-10-CM | POA: Diagnosis not present

## 2015-12-31 DIAGNOSIS — M5136 Other intervertebral disc degeneration, lumbar region: Secondary | ICD-10-CM | POA: Diagnosis not present

## 2015-12-31 DIAGNOSIS — Z8551 Personal history of malignant neoplasm of bladder: Secondary | ICD-10-CM | POA: Diagnosis not present

## 2015-12-31 DIAGNOSIS — E785 Hyperlipidemia, unspecified: Secondary | ICD-10-CM | POA: Diagnosis not present

## 2015-12-31 DIAGNOSIS — Z471 Aftercare following joint replacement surgery: Secondary | ICD-10-CM | POA: Diagnosis not present

## 2015-12-31 DIAGNOSIS — M8580 Other specified disorders of bone density and structure, unspecified site: Secondary | ICD-10-CM | POA: Diagnosis not present

## 2016-01-02 ENCOUNTER — Ambulatory Visit: Payer: Medicare Other | Admitting: Family Medicine

## 2016-01-02 DIAGNOSIS — I10 Essential (primary) hypertension: Secondary | ICD-10-CM | POA: Diagnosis not present

## 2016-01-02 DIAGNOSIS — M8580 Other specified disorders of bone density and structure, unspecified site: Secondary | ICD-10-CM | POA: Diagnosis not present

## 2016-01-02 DIAGNOSIS — Z96652 Presence of left artificial knee joint: Secondary | ICD-10-CM | POA: Diagnosis not present

## 2016-01-02 DIAGNOSIS — Z96641 Presence of right artificial hip joint: Secondary | ICD-10-CM | POA: Diagnosis not present

## 2016-01-02 DIAGNOSIS — I2510 Atherosclerotic heart disease of native coronary artery without angina pectoris: Secondary | ICD-10-CM | POA: Diagnosis not present

## 2016-01-02 DIAGNOSIS — E785 Hyperlipidemia, unspecified: Secondary | ICD-10-CM | POA: Diagnosis not present

## 2016-01-02 DIAGNOSIS — Z8551 Personal history of malignant neoplasm of bladder: Secondary | ICD-10-CM | POA: Diagnosis not present

## 2016-01-02 DIAGNOSIS — M5136 Other intervertebral disc degeneration, lumbar region: Secondary | ICD-10-CM | POA: Diagnosis not present

## 2016-01-02 DIAGNOSIS — Z471 Aftercare following joint replacement surgery: Secondary | ICD-10-CM | POA: Diagnosis not present

## 2016-01-02 DIAGNOSIS — Z7982 Long term (current) use of aspirin: Secondary | ICD-10-CM | POA: Diagnosis not present

## 2016-01-02 DIAGNOSIS — I714 Abdominal aortic aneurysm, without rupture: Secondary | ICD-10-CM | POA: Diagnosis not present

## 2016-01-03 DIAGNOSIS — I2510 Atherosclerotic heart disease of native coronary artery without angina pectoris: Secondary | ICD-10-CM | POA: Diagnosis not present

## 2016-01-03 DIAGNOSIS — Z7982 Long term (current) use of aspirin: Secondary | ICD-10-CM | POA: Diagnosis not present

## 2016-01-03 DIAGNOSIS — Z471 Aftercare following joint replacement surgery: Secondary | ICD-10-CM | POA: Diagnosis not present

## 2016-01-03 DIAGNOSIS — I10 Essential (primary) hypertension: Secondary | ICD-10-CM | POA: Diagnosis not present

## 2016-01-03 DIAGNOSIS — M5136 Other intervertebral disc degeneration, lumbar region: Secondary | ICD-10-CM | POA: Diagnosis not present

## 2016-01-03 DIAGNOSIS — M8580 Other specified disorders of bone density and structure, unspecified site: Secondary | ICD-10-CM | POA: Diagnosis not present

## 2016-01-03 DIAGNOSIS — I714 Abdominal aortic aneurysm, without rupture: Secondary | ICD-10-CM | POA: Diagnosis not present

## 2016-01-03 DIAGNOSIS — Z8551 Personal history of malignant neoplasm of bladder: Secondary | ICD-10-CM | POA: Diagnosis not present

## 2016-01-03 DIAGNOSIS — Z96641 Presence of right artificial hip joint: Secondary | ICD-10-CM | POA: Diagnosis not present

## 2016-01-03 DIAGNOSIS — Z96652 Presence of left artificial knee joint: Secondary | ICD-10-CM | POA: Diagnosis not present

## 2016-01-03 DIAGNOSIS — E785 Hyperlipidemia, unspecified: Secondary | ICD-10-CM | POA: Diagnosis not present

## 2016-01-07 DIAGNOSIS — Z7982 Long term (current) use of aspirin: Secondary | ICD-10-CM | POA: Diagnosis not present

## 2016-01-07 DIAGNOSIS — M8580 Other specified disorders of bone density and structure, unspecified site: Secondary | ICD-10-CM | POA: Diagnosis not present

## 2016-01-07 DIAGNOSIS — I2510 Atherosclerotic heart disease of native coronary artery without angina pectoris: Secondary | ICD-10-CM | POA: Diagnosis not present

## 2016-01-07 DIAGNOSIS — Z471 Aftercare following joint replacement surgery: Secondary | ICD-10-CM | POA: Diagnosis not present

## 2016-01-07 DIAGNOSIS — I10 Essential (primary) hypertension: Secondary | ICD-10-CM | POA: Diagnosis not present

## 2016-01-07 DIAGNOSIS — Z96641 Presence of right artificial hip joint: Secondary | ICD-10-CM | POA: Diagnosis not present

## 2016-01-07 DIAGNOSIS — E785 Hyperlipidemia, unspecified: Secondary | ICD-10-CM | POA: Diagnosis not present

## 2016-01-07 DIAGNOSIS — Z8551 Personal history of malignant neoplasm of bladder: Secondary | ICD-10-CM | POA: Diagnosis not present

## 2016-01-07 DIAGNOSIS — Z96652 Presence of left artificial knee joint: Secondary | ICD-10-CM | POA: Diagnosis not present

## 2016-01-07 DIAGNOSIS — M5136 Other intervertebral disc degeneration, lumbar region: Secondary | ICD-10-CM | POA: Diagnosis not present

## 2016-01-07 DIAGNOSIS — I714 Abdominal aortic aneurysm, without rupture: Secondary | ICD-10-CM | POA: Diagnosis not present

## 2016-01-15 ENCOUNTER — Other Ambulatory Visit: Payer: Self-pay | Admitting: Family Medicine

## 2016-01-22 DIAGNOSIS — Z8551 Personal history of malignant neoplasm of bladder: Secondary | ICD-10-CM | POA: Diagnosis not present

## 2016-02-05 ENCOUNTER — Other Ambulatory Visit: Payer: Self-pay | Admitting: Family Medicine

## 2016-02-06 DIAGNOSIS — Z96641 Presence of right artificial hip joint: Secondary | ICD-10-CM | POA: Diagnosis not present

## 2016-02-07 ENCOUNTER — Ambulatory Visit (INDEPENDENT_AMBULATORY_CARE_PROVIDER_SITE_OTHER): Payer: Medicare Other | Admitting: Family Medicine

## 2016-02-07 ENCOUNTER — Encounter: Payer: Self-pay | Admitting: Family Medicine

## 2016-02-07 VITALS — BP 116/71 | HR 90 | Temp 97.1°F | Ht 64.0 in | Wt 178.0 lb

## 2016-02-07 DIAGNOSIS — E7800 Pure hypercholesterolemia, unspecified: Secondary | ICD-10-CM

## 2016-02-07 DIAGNOSIS — I2510 Atherosclerotic heart disease of native coronary artery without angina pectoris: Secondary | ICD-10-CM

## 2016-02-07 DIAGNOSIS — E559 Vitamin D deficiency, unspecified: Secondary | ICD-10-CM | POA: Diagnosis not present

## 2016-02-07 DIAGNOSIS — I10 Essential (primary) hypertension: Secondary | ICD-10-CM

## 2016-02-07 NOTE — Progress Notes (Signed)
Subjective:    Patient ID: Gabriela Smith, female    DOB: [birth year], 75 y.o.   MRN: [id number]  HPI Pt here for follow up and management of chronic medical problems which includes hyperlipidemia and hypertension. She is taking medications regularly.The patient is doing well overall. She is concerned about her blood pressure. The blood pressure today however was good. She is going to get lab work and would be given an FOBT to return. She will return to the office for fasting lab work. She is being followed regularly by the urologist because of bladder cancer and everything seems to be stable with that. The patient denies any chest pain pressure or tightness. Prior to her recent hip surgery on the right side 6 weeks ago she did have a nuclear stress test and echocardiogram. The final result of that was that the cardiologist gave is okay for surgery. She was please with that. She denies any shortness of breath. She denies any trouble with her stomach including nausea vomiting diarrhea blood in the stool or black tarry bowel movements. She does see Dr. Elenora Gamma on a regular basis usually every 3 months for cystoscopies because of bladder cancer. The most recent one is good and her next one is planned sometime in January. She does have some frequency but there is no blood in the urine. Her last colonoscopy was in 2005 and she had a very bad experience with that and refused to get any more colonoscopies. There is no family history of colon cancer and is not been any blood in the stool so we will continue to monitor the FOBT's. The patient is pleasant and alert and looks much younger than her stated age. She has recently had right hip surgery and this was 6 weeks ago and is doing great with this.     Patient Active Problem List   Diagnosis Date Noted  . S/P right THA, AA 12/25/2015  . S/P hip replacement 12/25/2015  . AAA (abdominal aortic aneurysm) without rupture ([place]) 08/24/2015  . Statin intolerance  05/23/2013  . Osteopenia 04/20/2013  . Degenerative disc disease, lumbar 02/08/2013  . Vitamin D deficiency 02/08/2013  . Overweight (BMI 25.0-29.9) 11/24/2012  . S/P left TKA 11/22/2012  . Bladder cancer ([place]) 11/04/2012  . Osteoarthritis of left knee 11/04/2012  . Hyperlipidemia LDL goal <70 11/28/2008  . Hypertension 11/28/2008  . CORONARY ATHEROSCLEROSIS NATIVE CORONARY ARTERY 11/28/2008   Outpatient Encounter Prescriptions as of 02/07/2016  Medication Sig  . acetaminophen (TYLENOL) 500 MG tablet Take 2 tablets (1,000 mg total) by mouth every 8 (eight) hours.  [name] atorvastatin (LIPITOR) 20 MG tablet TAKE 1 TABLET DAILY  . cholecalciferol (VITAMIN D) 1000 UNITS tablet Take 1,000 Units by mouth daily.   [name] Co-Enzyme Q10 200 MG CAPS Take 1 capsule by mouth daily.  [name] docusate sodium (COLACE) 100 MG capsule Take 1 capsule (100 mg total) by mouth 2 (two) times daily.  . ferrous sulfate 325 (65 FE) MG tablet Take 1 tablet (325 mg total) by mouth 3 (three) times daily after meals.  . Flaxseed, Linseed, (FLAX SEED OIL) 1000 MG CAPS Take 1 capsule by mouth daily.   . hydrochlorothiazide (MICROZIDE) 12.5 MG capsule TAKE (1) CAPSULE DAILY  . Melatonin-Pyridoxine (MELATONEX PO) Take 3 mg by mouth at bedtime as needed (sleep).   . methocarbamol (ROBAXIN) 500 MG tablet Take 1 tablet (500 mg total) by mouth every 6 (six) hours as needed for muscle spasms.  . metoprolol (LOPRESSOR) 50  MG tablet TAKE (1) TABLET TWICE A DAY.  . nitroGLYCERIN (NITROSTAT) 0.4 MG SL tablet Place 1 tablet (0.4 mg total) under the tongue every 5 (five) minutes as needed for chest pain.  . polyethylene glycol (MIRALAX / GLYCOLAX) packet Take 17 g by mouth 2 (two) times daily.  . traMADol (ULTRAM) 50 MG tablet Take 1-2 tablets (50-100 mg total) by mouth every 6 (six) hours as needed.   Facility-Administered Encounter Medications as of 02/07/2016  Medication  . DOBUTamine (DOBUTREX) 1,000 mcg/mL in dextrose 5% 250 mL infusion        Review of Systems  Constitutional: Negative.   HENT: Negative.   Eyes: Negative.   Respiratory: Negative.   Cardiovascular: Negative.        Elevated BP  Gastrointestinal: Negative.   Endocrine: Negative.   Genitourinary: Negative.   Musculoskeletal: Negative.   Skin: Negative.   Allergic/Immunologic: Negative.   Neurological: Negative.   Hematological: Negative.   Psychiatric/Behavioral: Negative.        Objective:   Physical Exam  Constitutional: She is oriented to person, place, and time. She appears well-developed and well-nourished. No distress.  HENT:  Head: Normocephalic and atraumatic.  Right Ear: External ear normal.  Left Ear: External ear normal.  Nose: Nose normal.  Mouth/Throat: Oropharynx is clear and moist.  Eyes: Conjunctivae and EOM are normal. Pupils are equal, round, and reactive to light. Right eye exhibits no discharge. Left eye exhibits no discharge. No scleral icterus.  Neck: Normal range of motion. Neck supple. No thyromegaly present.  Cardiovascular: Normal rate, regular rhythm, normal heart sounds and intact distal pulses.  Exam reveals no friction rub.   No murmur heard. The heart was regular at 84/m without murmur.  Pulmonary/Chest: Effort normal and breath sounds normal. No respiratory distress. She has no wheezes. She has no rales.  Clear anteriorly and posteriorly  Abdominal: Soft. Bowel sounds are normal. She exhibits no mass. There is no tenderness. There is no rebound and no guarding.  Musculoskeletal: Normal range of motion. She exhibits no edema.  Good mobility especially considering recent right hip replacement.  Lymphadenopathy:    She has no cervical adenopathy.  Neurological: She is alert and oriented to person, place, and time. She has normal reflexes. No cranial nerve deficit.  Skin: Skin is warm and dry. No rash noted.  Psychiatric: She has a normal mood and affect. Her behavior is normal. Judgment and thought content  normal.  Nursing note and vitals reviewed.  BP 116/71 (BP Location: Left Arm)   Pulse 90   Temp 97.1 F (36.2 C) (Oral)   Ht '5\' 4"'  (1.626 m)   Wt 178 lb (80.7 kg)   BMI 30.55 kg/m         Assessment & Plan:  1. Hypertension -The blood pressure is good today and we will not make any changes in her treatment. - BMP8+EGFR; Future - CBC with Differential/Platelet; Future - Hepatic function panel; Future  2. Pure hypercholesterolemia -She will continue with current treatment and aggressive therapeutic lifestyle changes and will return for fasting blood work soon. - CBC with Differential/Platelet; Future - NMR, lipoprofile; Future  3. ASCVD (arteriosclerotic cardiovascular disease) -She does have a history of a stent but recent nuclear medicine testing and echocardiogram were basically stable and the patient was given copies of these reports and it was explained to her that she did have some apical ischemia on the nuclear medicine scan. But the echocardiogram was good. She does have a  remote history of stenting and this was several years ago. She is not having any chest pain or chest discomfort and she tolerated the recent surgical procedure well. - CBC with Differential/Platelet; Future - NMR, lipoprofile; Future  4. Vitamin D deficiency -Continue current treatment pending results of lab work - CBC with Differential/Platelet; Future - VITAMIN D 25 Hydroxy (Vit-D Deficiency, Fractures); Future  5. Atherosclerosis of native coronary artery of native heart without angina pectoris -Continue aggressive therapeutic lifestyle changes - CBC with Differential/Platelet; Future  Patient Instructions                       Medicare Annual Wellness Visit  [facility] and the medical providers at [facility] strive to bring you the best medical care.  In doing so we not only want to address your current medical conditions and concerns but also to detect new conditions  early and prevent illness, disease and health-related problems.    Medicare offers a yearly Wellness Visit which allows our clinical staff to assess your need for preventative services including immunizations, lifestyle education, counseling to decrease risk of preventable diseases and screening for fall risk and other medical concerns.    This visit is provided free of charge (no copay) for all Medicare recipients. The clinical pharmacists at [facility] have begun to conduct these Wellness Visits which will also include a thorough review of all your medications.    As you primary medical provider recommend that you make an appointment for your Annual Wellness Visit if you have not done so already this year.  You may set up this appointment before you leave today or you may call back ([phone]) and schedule an appointment.  Please make sure when you call that you mention that you are scheduling your Annual Wellness Visit with the clinical pharmacist so that the appointment may be made for the proper length of time.     Continue current medications. Continue good therapeutic lifestyle changes which include good diet and exercise. Fall precautions discussed with patient. If an FOBT was given today- please return it to our front desk. If you are over 42 years old - you may need Prevnar 52 or the adult Pneumonia vaccine.  **Flu shots are available--- please call and schedule a FLU-CLINIC appointment**  After your visit with [place] today you will receive a survey in the mail or online from [employer] regarding your care with [place]. Please take a moment to fill this out. Your feedback is very important to [place] as you can help [place] better understand your patient needs as well as improve your experience and satisfaction. WE CARE ABOUT YOU!!!  Continue to follow-up with orthopedics and urology as planned Exercise regularly and walk regularly and continue to follow diet closely and keep weight  down and blood pressure under good control   Arrie Senate MD

## 2016-02-07 NOTE — Patient Instructions (Addendum)
Medicare Annual Wellness Visit  [facility] and the medical providers at [facility] strive to bring you the best medical care.  In doing so we not only want to address your current medical conditions and concerns but also to detect new conditions early and prevent illness, disease and health-related problems.    Medicare offers a yearly Wellness Visit which allows our clinical staff to assess your need for preventative services including immunizations, lifestyle education, counseling to decrease risk of preventable diseases and screening for fall risk and other medical concerns.    This visit is provided free of charge (no copay) for all Medicare recipients. The clinical pharmacists at [facility] have begun to conduct these Wellness Visits which will also include a thorough review of all your medications.    As you primary medical provider recommend that you make an appointment for your Annual Wellness Visit if you have not done so already this year.  You may set up this appointment before you leave today or you may call back [phone] and schedule an appointment.  Please make sure when you call that you mention that you are scheduling your Annual Wellness Visit with the clinical pharmacist so that the appointment may be made for the proper length of time.     Continue current medications. Continue good therapeutic lifestyle changes which include good diet and exercise. Fall precautions discussed with patient. If an FOBT was given today- please return it to our front desk. If you are over 21 years old - you may need Prevnar 54 or the adult Pneumonia vaccine.  **Flu shots are available--- please call and schedule a FLU-CLINIC appointment**  After your visit with [place] today you will receive a survey in the mail or online from [employer] regarding your care with [place]. Please take a moment to fill this out. Your feedback is very  important to [place] as you can help [place] better understand your patient needs as well as improve your experience and satisfaction. WE CARE ABOUT YOU!!!  Continue to follow-up with orthopedics and urology as planned Exercise regularly and walk regularly and continue to follow diet closely and keep weight down and blood pressure under good control

## 2016-02-28 ENCOUNTER — Other Ambulatory Visit: Payer: Medicare Other

## 2016-02-28 DIAGNOSIS — E559 Vitamin D deficiency, unspecified: Secondary | ICD-10-CM | POA: Diagnosis not present

## 2016-02-28 DIAGNOSIS — I10 Essential (primary) hypertension: Secondary | ICD-10-CM | POA: Diagnosis not present

## 2016-02-28 DIAGNOSIS — E7800 Pure hypercholesterolemia, unspecified: Secondary | ICD-10-CM | POA: Diagnosis not present

## 2016-02-28 DIAGNOSIS — I2510 Atherosclerotic heart disease of native coronary artery without angina pectoris: Secondary | ICD-10-CM

## 2016-02-29 LAB — NMR, LIPOPROFILE
Cholesterol: 290 mg/dL — ABNORMAL HIGH (ref 100–199)
HDL Cholesterol by NMR: 54 mg/dL (ref >39)
HDL Particle Number: 39.7 umol/L (ref >=30.5)
LDL Particle Number: 2506 nmol/L — ABNORMAL HIGH (ref <1000)
LDL Size: 20.6 nm (ref >20.5)
LDL-C: 178 mg/dL — ABNORMAL HIGH (ref 0–99)
LP-IR Score: 76 — ABNORMAL HIGH (ref <=45)
Small LDL Particle Number: 1521 nmol/L — ABNORMAL HIGH (ref <=527)
Triglycerides by NMR: 289 mg/dL — ABNORMAL HIGH (ref 0–149)

## 2016-02-29 LAB — CBC WITH DIFFERENTIAL/PLATELET
Basophils Absolute: 0.1 10*3/uL (ref 0.0–0.2)
Basos: 1 %
EOS (ABSOLUTE): 0.2 10*3/uL (ref 0.0–0.4)
Eos: 4 %
Hematocrit: 36.6 % (ref 34.0–46.6)
Hemoglobin: 11.5 g/dL (ref 11.1–15.9)
Immature Grans (Abs): 0 10*3/uL (ref 0.0–0.1)
Immature Granulocytes: 0 %
Lymphocytes Absolute: 2.3 10*3/uL (ref 0.7–3.1)
Lymphs: 39 %
MCH: 31.3 pg (ref 26.6–33.0)
MCHC: 31.4 g/dL — ABNORMAL LOW (ref 31.5–35.7)
MCV: 100 fL — ABNORMAL HIGH (ref 79–97)
Monocytes Absolute: 0.6 10*3/uL (ref 0.1–0.9)
Monocytes: 10 %
Neutrophils Absolute: 2.8 10*3/uL (ref 1.4–7.0)
Neutrophils: 46 %
Platelets: 262 10*3/uL (ref 150–379)
RBC: 3.67 x10E6/uL — ABNORMAL LOW (ref 3.77–5.28)
RDW: 14.2 % (ref 12.3–15.4)
WBC: 5.9 10*3/uL (ref 3.4–10.8)

## 2016-02-29 LAB — HEPATIC FUNCTION PANEL
ALT: 11 IU/L (ref 0–32)
AST: 12 IU/L (ref 0–40)
Albumin: 4.2 g/dL (ref 3.5–4.8)
Alkaline Phosphatase: 78 IU/L (ref 39–117)
Bilirubin Total: 0.3 mg/dL (ref 0.0–1.2)
Bilirubin, Direct: 0.07 mg/dL (ref 0.00–0.40)
Total Protein: 6.6 g/dL (ref 6.0–8.5)

## 2016-02-29 LAB — BMP8+EGFR
BUN/Creatinine Ratio: 20 (ref 12–28)
BUN: 14 mg/dL (ref 8–27)
CO2: 26 mmol/L (ref 18–29)
Calcium: 9.2 mg/dL (ref 8.7–10.3)
Chloride: 100 mmol/L (ref 96–106)
Creatinine, Ser: 0.71 mg/dL (ref 0.57–1.00)
GFR calc Af Amer: 95 mL/min/{1.73_m2} (ref >59)
GFR calc non Af Amer: 82 mL/min/{1.73_m2} (ref >59)
Glucose: 92 mg/dL (ref 65–99)
Potassium: 4 mmol/L (ref 3.5–5.2)
Sodium: 142 mmol/L (ref 134–144)

## 2016-02-29 LAB — VITAMIN D 25 HYDROXY (VIT D DEFICIENCY, FRACTURES): Vit D, 25-Hydroxy: 30.5 ng/mL (ref 30.0–100.0)

## 2016-03-11 DIAGNOSIS — Z8551 Personal history of malignant neoplasm of bladder: Secondary | ICD-10-CM | POA: Diagnosis not present

## 2016-03-17 ENCOUNTER — Other Ambulatory Visit: Payer: Medicare Other

## 2016-03-17 DIAGNOSIS — Z1211 Encounter for screening for malignant neoplasm of colon: Secondary | ICD-10-CM | POA: Diagnosis not present

## 2016-03-18 LAB — FECAL OCCULT BLOOD, IMMUNOCHEMICAL: Fecal Occult Bld: NEGATIVE

## 2016-03-24 ENCOUNTER — Other Ambulatory Visit: Payer: Self-pay | Admitting: Family Medicine

## 2016-04-11 ENCOUNTER — Other Ambulatory Visit: Payer: Self-pay | Admitting: Family Medicine

## 2016-04-28 DIAGNOSIS — H2513 Age-related nuclear cataract, bilateral: Secondary | ICD-10-CM | POA: Diagnosis not present

## 2016-04-28 DIAGNOSIS — H524 Presbyopia: Secondary | ICD-10-CM | POA: Diagnosis not present

## 2016-04-28 DIAGNOSIS — H25013 Cortical age-related cataract, bilateral: Secondary | ICD-10-CM | POA: Diagnosis not present

## 2016-04-28 DIAGNOSIS — H5203 Hypermetropia, bilateral: Secondary | ICD-10-CM | POA: Diagnosis not present

## 2016-05-05 DIAGNOSIS — L308 Other specified dermatitis: Secondary | ICD-10-CM | POA: Diagnosis not present

## 2016-05-05 DIAGNOSIS — C44729 Squamous cell carcinoma of skin of left lower limb, including hip: Secondary | ICD-10-CM | POA: Diagnosis not present

## 2016-05-05 DIAGNOSIS — L718 Other rosacea: Secondary | ICD-10-CM | POA: Diagnosis not present

## 2016-05-20 DIAGNOSIS — Z0289 Encounter for other administrative examinations: Secondary | ICD-10-CM

## 2016-06-10 ENCOUNTER — Telehealth: Payer: Self-pay | Admitting: Family Medicine

## 2016-06-10 DIAGNOSIS — Z8551 Personal history of malignant neoplasm of bladder: Secondary | ICD-10-CM | POA: Diagnosis not present

## 2016-06-10 NOTE — Telephone Encounter (Signed)
Pt saw Dr. Karsten Ro today, sees him every 3 mos and her BPs have consistently been in the 160s / 51s wanting her to talk to her PCP about this.  Also when she has stress test & US done before her hip surgery it was very high as well. Her last readings here 02/07/16 was 167/89 was good at 116/71 but 11/15/15 167/89 & 10/08/15 was 166/80. Her readings at home she states are pretty normal 130s-140s / 70s-80s. Next appt with you is in June Please advise

## 2016-06-10 NOTE — Telephone Encounter (Signed)
Increase hydrochlorothiazide to 25 mg daily. Make sure she comes in in 2 weeks for a recheck and bring home blood pressures with her at that time. We will also want to get a BMP at that time.

## 2016-06-11 NOTE — Telephone Encounter (Signed)
Patient aware and verbalizes understanding. 

## 2016-07-30 ENCOUNTER — Other Ambulatory Visit: Payer: Self-pay | Admitting: Family Medicine

## 2016-07-31 ENCOUNTER — Other Ambulatory Visit: Payer: Medicare Other

## 2016-07-31 DIAGNOSIS — E559 Vitamin D deficiency, unspecified: Secondary | ICD-10-CM | POA: Diagnosis not present

## 2016-07-31 DIAGNOSIS — E7800 Pure hypercholesterolemia, unspecified: Secondary | ICD-10-CM | POA: Diagnosis not present

## 2016-07-31 DIAGNOSIS — I2510 Atherosclerotic heart disease of native coronary artery without angina pectoris: Secondary | ICD-10-CM | POA: Diagnosis not present

## 2016-07-31 DIAGNOSIS — I10 Essential (primary) hypertension: Secondary | ICD-10-CM

## 2016-08-01 ENCOUNTER — Other Ambulatory Visit: Payer: Self-pay | Admitting: *Deleted

## 2016-08-01 LAB — CBC WITH DIFFERENTIAL/PLATELET
Basophils Absolute: 0 10*3/uL (ref 0.0–0.2)
Basos: 1 %
EOS (ABSOLUTE): 0.1 10*3/uL (ref 0.0–0.4)
Eos: 2 %
Hematocrit: 36.4 % (ref 34.0–46.6)
Hemoglobin: 12.1 g/dL (ref 11.1–15.9)
Immature Grans (Abs): 0 10*3/uL (ref 0.0–0.1)
Immature Granulocytes: 0 %
Lymphocytes Absolute: 2.1 10*3/uL (ref 0.7–3.1)
Lymphs: 41 %
MCH: 31 pg (ref 26.6–33.0)
MCHC: 33.2 g/dL (ref 31.5–35.7)
MCV: 93 fL (ref 79–97)
Monocytes Absolute: 0.5 10*3/uL (ref 0.1–0.9)
Monocytes: 9 %
Neutrophils Absolute: 2.5 10*3/uL (ref 1.4–7.0)
Neutrophils: 47 %
Platelets: 244 10*3/uL (ref 150–379)
RBC: 3.9 x10E6/uL (ref 3.77–5.28)
RDW: 13.7 % (ref 12.3–15.4)
WBC: 5.2 10*3/uL (ref 3.4–10.8)

## 2016-08-01 LAB — BMP8+EGFR
BUN/Creatinine Ratio: 18 (ref 12–28)
BUN: 14 mg/dL (ref 8–27)
CO2: 24 mmol/L (ref 18–29)
Calcium: 9.4 mg/dL (ref 8.7–10.3)
Chloride: 97 mmol/L (ref 96–106)
Creatinine, Ser: 0.76 mg/dL (ref 0.57–1.00)
GFR calc Af Amer: 88 mL/min/{1.73_m2} (ref >59)
GFR calc non Af Amer: 76 mL/min/{1.73_m2} (ref >59)
Glucose: 102 mg/dL — ABNORMAL HIGH (ref 65–99)
Potassium: 4.7 mmol/L (ref 3.5–5.2)
Sodium: 139 mmol/L (ref 134–144)

## 2016-08-01 LAB — HEPATIC FUNCTION PANEL
ALT: 16 IU/L (ref 0–32)
AST: 18 IU/L (ref 0–40)
Albumin: 4.5 g/dL (ref 3.5–4.8)
Alkaline Phosphatase: 69 IU/L (ref 39–117)
Bilirubin Total: 0.3 mg/dL (ref 0.0–1.2)
Bilirubin, Direct: 0.08 mg/dL (ref 0.00–0.40)
Total Protein: 7.1 g/dL (ref 6.0–8.5)

## 2016-08-01 LAB — NMR, LIPOPROFILE
Cholesterol: 279 mg/dL — ABNORMAL HIGH (ref 100–199)
HDL Cholesterol by NMR: 49 mg/dL (ref >39)
HDL Particle Number: 33.5 umol/L (ref >=30.5)
LDL Particle Number: 2795 nmol/L — ABNORMAL HIGH (ref <1000)
LDL Size: 19.9 nm (ref >20.5)
LP-IR Score: 77 — ABNORMAL HIGH (ref <=45)
Small LDL Particle Number: 1529 nmol/L — ABNORMAL HIGH (ref <=527)
Triglycerides by NMR: 414 mg/dL — ABNORMAL HIGH (ref 0–149)

## 2016-08-01 LAB — VITAMIN D 25 HYDROXY (VIT D DEFICIENCY, FRACTURES): Vit D, 25-Hydroxy: 32.6 ng/mL (ref 30.0–100.0)

## 2016-08-01 MED ORDER — HYDROCHLOROTHIAZIDE 12.5 MG PO CAPS: 12.5000 mg | ORAL_CAPSULE | Freq: Two times a day (BID) | ORAL | 0 refills | Status: DC

## 2016-08-01 NOTE — Addendum Note (Signed)
Addended by: Zannie Cove on: 08/01/2016 11:05 AM   Modules accepted: Orders

## 2016-08-12 ENCOUNTER — Ambulatory Visit (INDEPENDENT_AMBULATORY_CARE_PROVIDER_SITE_OTHER): Payer: Medicare Other | Admitting: Family Medicine

## 2016-08-12 ENCOUNTER — Encounter: Payer: Self-pay | Admitting: Family Medicine

## 2016-08-12 VITALS — BP 138/72 | HR 67 | Temp 97.7°F | Ht 64.0 in | Wt 176.0 lb

## 2016-08-12 DIAGNOSIS — E559 Vitamin D deficiency, unspecified: Secondary | ICD-10-CM

## 2016-08-12 DIAGNOSIS — E7800 Pure hypercholesterolemia, unspecified: Secondary | ICD-10-CM

## 2016-08-12 DIAGNOSIS — I10 Essential (primary) hypertension: Secondary | ICD-10-CM | POA: Diagnosis not present

## 2016-08-12 DIAGNOSIS — I2510 Atherosclerotic heart disease of native coronary artery without angina pectoris: Secondary | ICD-10-CM

## 2016-08-12 MED ORDER — EZETIMIBE 10 MG PO TABS: 10.0000 mg | ORAL_TABLET | Freq: Every day | ORAL | 4 refills | Status: DC

## 2016-08-12 MED ORDER — ICOSAPENT ETHYL 1 G PO CAPS: 1.0000 | ORAL_CAPSULE | Freq: Two times a day (BID) | ORAL | 4 refills | Status: DC

## 2016-08-12 NOTE — Progress Notes (Signed)
Subjective:    Patient ID: SISTER CARBONE, female    DOB: Aug 01, 1940, 76 y.o.   MRN: [id number]  HPI Pt here for follow up and management of chronic medical problems which includes hyperlipidemia and hypertension. She is taking medication regularly.This patient is also followed regularly by the urologist because of bladder cancer. She checks her blood pressure at home and blood pressure readings at home were usually good. She has no specific complaints. She is due to get a mammogram and she will schedule this. She has had her lab work done and we will review this with her during the visit today. Cholesterol numbers with advanced lipid testing have a total LDL particle number that is elevated at 2795. This patient has a history of an abdominal aortic aneurysm and atherosclerotic heart disease. Triglycerides were elevated this time at 414. The good cholesterol is within normal limits. The CBC has a normal white blood cell count with a good hemoglobin at 12.1 and an adequate platelet count. The blood sugar slightly elevated at 102 with normal creatinine and electrolytes. All liver function tests were normal. Vitamin D level was within normal limits but at the low end of the normal range at 32.6. The patient denies any chest pain pressure or tightness. Back in the 90s she did have angioplasty because of atherosclerotic coronary artery disease. She denies any shortness of breath. She says that her blood pressures at home have been generally running in the 120-140 range over the 70-85 range. She did not bring in the readings for review today. She denies any trouble with nausea vomiting diarrhea blood in the stool or black tarry bowel movements. She is currently bladder cancer free and will go back for a recheck every 6 months with Dr. Karsten Ro.    Patient Active Problem List   Diagnosis Date Noted  . S/P right THA, AA 12/25/2015  . S/P hip replacement 12/25/2015  . AAA (abdominal aortic aneurysm) without  rupture ([facility]) 08/24/2015  . Statin intolerance 05/23/2013  . Osteopenia 04/20/2013  . Degenerative disc disease, lumbar 02/08/2013  . Vitamin D deficiency 02/08/2013  . Overweight (BMI 25.0-29.9) 11/24/2012  . S/P left TKA 11/22/2012  . Bladder cancer ([place]) 11/04/2012  . Osteoarthritis of left knee 11/04/2012  . Hyperlipidemia LDL goal <70 11/28/2008  . Hypertension 11/28/2008  . CORONARY ATHEROSCLEROSIS NATIVE CORONARY ARTERY 11/28/2008   Outpatient Encounter Prescriptions as of 08/12/2016  Medication Sig  . acetaminophen (TYLENOL) 500 MG tablet Take 2 tablets (1,000 mg total) by mouth every 8 (eight) hours.  . cholecalciferol (VITAMIN D) 1000 UNITS tablet Take 1,000 Units by mouth daily.   [name] Co-Enzyme Q10 200 MG CAPS Take 1 capsule by mouth daily.  . Flaxseed, Linseed, (FLAX SEED OIL) 1000 MG CAPS Take 1 capsule by mouth daily.   . hydrochlorothiazide (MICROZIDE) 12.5 MG capsule Take 1 capsule (12.5 mg total) by mouth 2 (two) times daily.  . Melatonin-Pyridoxine (MELATONEX PO) Take 3 mg by mouth at bedtime as needed (sleep).   . metoprolol (LOPRESSOR) 50 MG tablet TAKE (1) TABLET TWICE A DAY.  . nitroGLYCERIN (NITROSTAT) 0.4 MG SL tablet Place 1 tablet (0.4 mg total) under the tongue every 5 (five) minutes as needed for chest pain.  . traMADol (ULTRAM) 50 MG tablet Take 1-2 tablets (50-100 mg total) by mouth every 6 (six) hours as needed.  . [DISCONTINUED] atorvastatin (LIPITOR) 20 MG tablet TAKE 1 TABLET DAILY  . [DISCONTINUED] docusate sodium (COLACE) 100 MG capsule Take 1 capsule (  100 mg total) by mouth 2 (two) times daily.  . [DISCONTINUED] ferrous sulfate 325 (65 FE) MG tablet Take 1 tablet (325 mg total) by mouth 3 (three) times daily after meals.  . [DISCONTINUED] methocarbamol (ROBAXIN) 500 MG tablet Take 1 tablet (500 mg total) by mouth every 6 (six) hours as needed for muscle spasms.  . [DISCONTINUED] polyethylene glycol (MIRALAX / GLYCOLAX) packet Take 17 g by mouth 2 (two)  times daily.   Facility-Administered Encounter Medications as of 08/12/2016  Medication  . DOBUTamine (DOBUTREX) 1,000 mcg/mL in dextrose 5% 250 mL infusion      Review of Systems  Constitutional: Negative.   HENT: Negative.   Eyes: Negative.   Respiratory: Negative.   Cardiovascular: Negative.   Gastrointestinal: Negative.   Endocrine: Negative.   Genitourinary: Negative.   Musculoskeletal: Negative.   Skin: Negative.   Allergic/Immunologic: Negative.   Neurological: Negative.   Hematological: Negative.   Psychiatric/Behavioral: Negative.        Objective:   Physical Exam  Constitutional: She is oriented to person, place, and time. She appears well-developed and well-nourished. No distress.  Pleasant and alert  HENT:  Head: Normocephalic and atraumatic.  Right Ear: External ear normal.  Left Ear: External ear normal.  Nose: Nose normal.  Mouth/Throat: Oropharynx is clear and moist. No oropharyngeal exudate.  Eyes: Conjunctivae and EOM are normal. Pupils are equal, round, and reactive to light. Right eye exhibits no discharge. Left eye exhibits no discharge. No scleral icterus.  Neck: Normal range of motion. Neck supple. No thyromegaly present.  No bruits thyromegaly or anterior cervical adenopathy  Cardiovascular: Normal rate, regular rhythm, normal heart sounds and intact distal pulses.   No murmur heard. Distal pulses were present but weak with the left being harder to find in the right. The heart is regular at 72/m  Pulmonary/Chest: Effort normal and breath sounds normal. No respiratory distress. She has no wheezes. She has no rales.  Clear anteriorly and posteriorly  Abdominal: Soft. Bowel sounds are normal. She exhibits no mass. There is no tenderness. There is no rebound and no guarding.  No liver or spleen enlargement no bruits no inguinal adenopathy and no masses.  Musculoskeletal: Normal range of motion. She exhibits no edema.  Lymphadenopathy:    She has no  cervical adenopathy.  Neurological: She is alert and oriented to person, place, and time. She has normal reflexes. No cranial nerve deficit.  Skin: Skin is warm and dry. No rash noted.  Psychiatric: She has a normal mood and affect. Her behavior is normal. Judgment and thought content normal.  Nursing note and vitals reviewed.  BP (!) 144/67 (BP Location: Left Arm)   Pulse 67   Temp 97.7 F (36.5 C) (Oral)   Ht 5\' 4"  (1.626 m)   Wt 176 lb (79.8 kg)   BMI 30.21 kg/m   Repeat blood pressure right arm sitting regular cuff was 138/72      Assessment & Plan:  1. Pure hypercholesterolemia -The patient is statin intolerant. She also has elevated triglycerides. She would like to try Zetia 10 mg once daily as this has worked well for her sister. We will let her start with this and add vascepa to this after a week and gradually increase that to 1 twice a day. In addition she will try to walk and exercise more and try to do better with her diet. She'll see the clinical pharmacists and 4-6 weeks and get a fasting lipid panel and the PCS  K-9 inhibitor therapy can be discussed with the pharmacist at that time if the Zetia and the omega-3 fatty acid does not work.  2. ASCVD (arteriosclerotic cardiovascular disease) -Continue with follow-up with cardiology as needed  3. Hypertension -Watch sodium intake and monitor blood pressures at home and bring readings to the next visit  4. Vitamin D deficiency -She will increase her vitamin D replacement and if she is taking 2000 units daily she will continue to take 2000 Monday through Friday and take 4000 on Saturday and Sunday  5. Atherosclerosis of native coronary artery of native heart without angina pectoris -Continue cardiac follow-up along with aggressive therapeutic lifestyle changes for cholesterol  Meds ordered this encounter  Medications  . ezetimibe (ZETIA) 10 MG tablet    Sig: Take 1 tablet (10 mg total) by mouth daily.    Dispense:  30  tablet    Refill:  4  . Icosapent Ethyl (VASCEPA) 1 g CAPS    Sig: Take 1 capsule by mouth 2 (two) times daily.    Dispense:  60 capsule    Refill:  4   Patient Instructions                       Medicare Annual Wellness Visit  [facility] and the medical providers at [facility] strive to bring you the best medical care.  In doing so we not only want to address your current medical conditions and concerns but also to detect new conditions early and prevent illness, disease and health-related problems.    Medicare offers a yearly Wellness Visit which allows our clinical staff to assess your need for preventative services including immunizations, lifestyle education, counseling to decrease risk of preventable diseases and screening for fall risk and other medical concerns.    This visit is provided free of charge (no copay) for all Medicare recipients. The clinical pharmacists at [facility] have begun to conduct these Wellness Visits which will also include a thorough review of all your medications.    As you primary medical provider recommend that you make an appointment for your Annual Wellness Visit if you have not done so already this year.  You may set up this appointment before you leave today or you may call back [phone]) and schedule an appointment.  Please make sure when you call that you mention that you are scheduling your Annual Wellness Visit with the clinical pharmacist so that the appointment may be made for the proper length of time.     Continue current medications. Continue good therapeutic lifestyle changes which include good diet and exercise. Fall precautions discussed with patient. If an FOBT was given today- please return it to our front desk. If you are over 39 years old - you may need Prevnar 47 or the adult Pneumonia vaccine.  **Flu shots are available--- please call and schedule a FLU-CLINIC appointment**  After  your visit with [place] today you will receive a survey in the mail or online from [employer] regarding your care with [place]. Please take a moment to fill this out. Your feedback is very important to [place] as you can help [place] better understand your patient needs as well as improve your experience and satisfaction. WE CARE ABOUT YOU!!!   We will schedule an appointment with clinical pharmacy to review your cholesterol numbers in about 4-6 weeks. In the meantime we will try Zetia 10 mg 1 daily and we will add  vascepa 1 g twice daily to this. She does have a fish allergy to bottom fish. She can eat strep. She will start with the vascepa one daily for a few days before she opted to 2 daily.  She will continue to monitor blood pressures at home and bring readings in to the office.  Arrie Senate MD

## 2016-08-12 NOTE — Patient Instructions (Addendum)
Medicare Annual Wellness Visit  [facility] and the medical providers at [facility] strive to bring you the best medical care.  In doing so we not only want to address your current medical conditions and concerns but also to detect new conditions early and prevent illness, disease and health-related problems.    Medicare offers a yearly Wellness Visit which allows our clinical staff to assess your need for preventative services including immunizations, lifestyle education, counseling to decrease risk of preventable diseases and screening for fall risk and other medical concerns.    This visit is provided free of charge (no copay) for all Medicare recipients. The clinical pharmacists at [facility] have begun to conduct these Wellness Visits which will also include a thorough review of all your medications.    As you primary medical provider recommend that you make an appointment for your Annual Wellness Visit if you have not done so already this year.  You may set up this appointment before you leave today or you may call back [phone]) and schedule an appointment.  Please make sure when you call that you mention that you are scheduling your Annual Wellness Visit with the clinical pharmacist so that the appointment may be made for the proper length of time.     Continue current medications. Continue good therapeutic lifestyle changes which include good diet and exercise. Fall precautions discussed with patient. If an FOBT was given today- please return it to our front desk. If you are over 65 years old - you may need Prevnar 36 or the adult Pneumonia vaccine.  **Flu shots are available--- please call and schedule a FLU-CLINIC appointment**  After your visit with [place] today you will receive a survey in the mail or online from [employer] regarding your care with [place]. Please take a moment to fill this out. Your feedback is very  important to [place] as you can help [place] better understand your patient needs as well as improve your experience and satisfaction. WE CARE ABOUT YOU!!!   We will schedule an appointment with clinical pharmacy to review your cholesterol numbers in about 4-6 weeks. In the meantime we will try Zetia 10 mg 1 daily and we will add vascepa 1 g twice daily to this. She does have a fish allergy to bottom fish. She can eat strep. She will start with the vascepa one daily for a few days before she opted to 2 daily.  She will continue to monitor blood pressures at home and bring readings in to the office.

## 2016-09-08 ENCOUNTER — Other Ambulatory Visit: Payer: Self-pay | Admitting: Family Medicine

## 2016-09-18 ENCOUNTER — Encounter: Payer: Self-pay | Admitting: Physician Assistant

## 2016-09-18 ENCOUNTER — Ambulatory Visit (INDEPENDENT_AMBULATORY_CARE_PROVIDER_SITE_OTHER): Payer: Medicare Other | Admitting: Physician Assistant

## 2016-09-18 VITALS — BP 146/86 | HR 97 | Temp 97.9°F | Ht 64.0 in | Wt 183.0 lb

## 2016-09-18 DIAGNOSIS — N3000 Acute cystitis without hematuria: Secondary | ICD-10-CM | POA: Diagnosis not present

## 2016-09-18 DIAGNOSIS — R300 Dysuria: Secondary | ICD-10-CM

## 2016-09-18 DIAGNOSIS — N952 Postmenopausal atrophic vaginitis: Secondary | ICD-10-CM | POA: Diagnosis not present

## 2016-09-18 HISTORY — DX: Postmenopausal atrophic vaginitis: N95.2

## 2016-09-18 LAB — MICROSCOPIC EXAMINATION
Epithelial Cells (non renal): >10 /hpf — AB (ref 0–10)
Renal Epithel, UA: NONE SEEN /hpf

## 2016-09-18 LAB — URINALYSIS, COMPLETE
Bilirubin, UA: NEGATIVE
Glucose, UA: NEGATIVE
Ketones, UA: NEGATIVE
Nitrite, UA: NEGATIVE
Protein, UA: NEGATIVE
Specific Gravity, UA: 1.02 (ref 1.005–1.030)
Urobilinogen, Ur: 0.2 mg/dL (ref 0.2–1.0)
pH, UA: 7 (ref 5.0–7.5)

## 2016-09-18 MED ORDER — CLOTRIMAZOLE-BETAMETHASONE 1-0.05 % EX CREA: 1.0000 "application " | TOPICAL_CREAM | Freq: Two times a day (BID) | CUTANEOUS | 6 refills | Status: DC

## 2016-09-18 MED ORDER — CIPROFLOXACIN HCL 500 MG PO TABS
500.0000 mg | ORAL_TABLET | Freq: Two times a day (BID) | ORAL | 0 refills | Status: DC
Start: 2016-09-18 — End: 2017-02-11

## 2016-09-18 NOTE — Progress Notes (Signed)
BP (!) 146/86   Pulse 97   Temp 97.9 F (36.6 C) (Oral)   Ht 5\' 4"  (1.626 m)   Wt 183 lb (83 kg)   BMI 31.41 kg/m    Subjective:    Patient ID: Gabriela Smith, female    DOB: [birth year], 76 y.o.   MRN: [id number]  HPI: Gabriela Smith is a 76 y.o. female presenting on 09/18/2016 for Vaginal Itching; vaginal burning; and Dysuria  Continues with vulvovaginal irritation and burning.  Topical steroid helped in the past but did not continue. Has bladder cancer and treatment. Also dealing with UTI. This patient has had several days of dysuria, frequency and nocturia. There is also pain over the bladder in the suprapubic region, no back pain. Denies leakage or hematuria.  Denies fever or chills. No pain in flank area.  Relevant past medical, surgical, family and social history reviewed and updated as indicated. Allergies and medications reviewed and updated.  Past Medical History:  Diagnosis Date  . AAA (abdominal aortic aneurysm) [facility])    cardiology aware   . Arthritis    OA lt knee- cortizone inj. q 4 months AND OA ALSO IN BACK. Had knee replaced-issue resolved  . CAD (coronary artery disease) CARDIOLOGIST- DR [facility]--  VISIT 01-02-10 IN EPIC   1993-- PTCA. Pt describes a near total blockage of apparently the LAD and a 65% stenosis elsewhere.  . Cancer ([place])   . History of bladder cancer followed by dr Karsten Ro   hx  TCC of bladder ,  Ta G3-  first occurence 02-20-2012--  s/p BCG tx's  and  mitomycin C  . Hyperlipidemia 1993  . Hypertension 1993  . Nocturia   . Osteopenia   . Sigmoid diverticulosis     Past Surgical History:  Procedure Laterality Date  . CHOLECYSTECTOMY  2003 (approx)  . COLONOSCOPY  05-31-2003  . CORONARY ANGIOPLASTY  1993   to LAD  . CYSTOSCOPY WITH BIOPSY  05/09/2011   Procedure: CYSTOSCOPY WITH BIOPSY;  Surgeon: Claybon Jabs, MD;  Location: [facility];  Service: Urology;  Laterality: N/A;  WITH BLADDER BIOPSY GYRUS  . TOTAL HIP  ARTHROPLASTY Right 12/25/2015   Procedure: RIGHT TOTAL HIP ARTHROPLASTY ANTERIOR APPROACH;  Surgeon: Paralee Cancel, MD;  Location: [facility];  Service: Orthopedics;  Laterality: Right;  . TOTAL KNEE ARTHROPLASTY Left 11/22/2012   Procedure: LEFT TOTAL KNEE ARTHROPLASTY;  Surgeon: Mauri Pole, MD;  Location: [facility];  Service: Orthopedics;  Laterality: Left;  . TRANSURETHRAL RESECTION OF BLADDER TUMOR  03/14/2011   Procedure: TRANSURETHRAL RESECTION OF BLADDER TUMOR (TURBT);  Surgeon: Claybon Jabs, MD;  Location: [facility];  Service: Urology;  Laterality: N/A;  . TRANSURETHRAL RESECTION OF BLADDER TUMOR  05/09/2011   Procedure: TRANSURETHRAL RESECTION OF BLADDER TUMOR (TURBT);  Surgeon: Claybon Jabs, MD;  Location: [facility];  Service: Urology;  Laterality: N/A;  . TRANSURETHRAL RESECTION OF BLADDER TUMOR  03/08/2012   Procedure: TRANSURETHRAL RESECTION OF BLADDER TUMOR (TURBT);  Surgeon: Claybon Jabs, MD;  Location: [facility];  Service: Urology;  Laterality: N/A;  . TRANSURETHRAL RESECTION OF BLADDER TUMOR WITH GYRUS (TURBT-GYRUS) N/A 03/24/2014   Procedure: TRANSURETHRAL RESECTION OF BLADDER TUMOR WITH GYRUS (TURBT-GYRUS);  Surgeon: Claybon Jabs, MD;  Location: [facility];  Service: Urology;  Laterality: N/A;  . UMBILICAL HERNIA REPAIR  2001  (approx)    Review of Systems  Constitutional: Negative.   HENT:  Negative.   Eyes: Negative.   Respiratory: Negative.   Gastrointestinal: Negative.   Genitourinary: Positive for difficulty urinating, dysuria, urgency and vaginal pain. Negative for flank pain, vaginal bleeding and vaginal discharge.    Allergies as of 09/18/2016      Reactions   Fish Allergy Hives   Bottom Fish    Lasix [furosemide] Other (See Comments)   Edema, elevated BP   Macrodantin [nitrofurantoin Macrocrystal] Other (See Comments)   Unknown reaction    Niaspan [niacin Er] Other (See Comments)   Muscle aches    Statins Other (See Comments)   Muscle aches/ cramps   Vicodin [hydrocodone-acetaminophen] Other (See Comments)   SEVERE N/V   Zetia [ezetimibe] Other (See Comments)   Codeine Rash   Latex Itching, Rash   Reports Elastic in underwear, under wire bras, and now surgical cap cause rash and itching.    Sulfa Antibiotics Rash      Medication List       Accurate as of 09/18/16  9:38 PM. Always use your most recent med list.          acetaminophen 500 MG tablet Commonly known as:  TYLENOL Take 2 tablets (1,000 mg total) by mouth every 8 (eight) hours.   cholecalciferol 1000 units tablet Commonly known as:  VITAMIN D Take 1,000 Units by mouth daily.   ciprofloxacin 500 MG tablet Commonly known as:  CIPRO Take 1 tablet (500 mg total) by mouth 2 (two) times daily.   clotrimazole-betamethasone cream Commonly known as:  LOTRISONE Apply 1 application topically 2 (two) times daily.   Co-Enzyme Q10 200 MG Caps Take 1 capsule by mouth daily.   ezetimibe 10 MG tablet Commonly known as:  ZETIA TAKE 1 TABLET DAILY   Flax Seed Oil 1000 MG Caps Take 1 capsule by mouth daily.   hydrochlorothiazide 12.5 MG capsule Commonly known as:  MICROZIDE Take 1 capsule (12.5 mg total) by mouth 2 (two) times daily.   Icosapent Ethyl 1 g Caps Commonly known as:  VASCEPA Take 1 capsule by mouth 2 (two) times daily.   MELATONEX PO Take 3 mg by mouth at bedtime as needed (sleep).   metoprolol tartrate 50 MG tablet Commonly known as:  LOPRESSOR TAKE (1) TABLET TWICE A DAY.   nitroGLYCERIN 0.4 MG SL tablet Commonly known as:  NITROSTAT Place 1 tablet (0.4 mg total) under the tongue every 5 (five) minutes as needed for chest pain.   traMADol 50 MG tablet Commonly known as:  ULTRAM Take 1-2 tablets (50-100 mg total) by mouth every 6 (six) hours as needed.          Objective:    BP (!) 146/86   Pulse 97   Temp 97.9 F (36.6 C) (Oral)   Ht 5\' 4"  (1.626 m)   Wt 183 lb (83 kg)   BMI  31.41 kg/m   Allergies  Allergen Reactions  . Fish Allergy Hives    Bottom Fish   . Lasix [Furosemide] Other (See Comments)    Edema, elevated BP  . Macrodantin [Nitrofurantoin Macrocrystal] Other (See Comments)    Unknown reaction   . Niaspan [Niacin Er] Other (See Comments)    Muscle aches  . Statins Other (See Comments)    Muscle aches/ cramps  . Vicodin [Hydrocodone-Acetaminophen] Other (See Comments)    SEVERE N/V  . Zetia [Ezetimibe] Other (See Comments)  . Codeine Rash  . Latex Itching and Rash    Reports Elastic in underwear, under wire bras,  and now surgical cap cause rash and itching.   . Sulfa Antibiotics Rash    Physical Exam  Constitutional: She is oriented to person, place, and time. She appears well-developed and well-nourished.  HENT:  Head: Normocephalic and atraumatic.  Eyes: Pupils are equal, round, and reactive to light. Conjunctivae are normal.  Cardiovascular: Normal rate, regular rhythm, normal heart sounds and intact distal pulses.   Pulmonary/Chest: Effort normal and breath sounds normal.  Abdominal: Soft. Bowel sounds are normal. She exhibits no distension and no mass. There is tenderness in the suprapubic area. There is no rebound, no guarding and no CVA tenderness.  Neurological: She is alert and oriented to person, place, and time. She has normal reflexes.  Skin: Skin is warm and dry. No rash noted.  Psychiatric: She has a normal mood and affect. Her behavior is normal. Judgment and thought content normal.    Results for orders placed or performed in visit on 09/18/16  Microscopic Examination  Result Value Ref Range   WBC, UA 6-10 (A) 0 - 5 /hpf   RBC, UA 0-2 0 - 2 /hpf   Epithelial Cells (non renal) >10 (A) 0 - 10 /hpf   Renal Epithel, UA None seen None seen /hpf   Bacteria, UA Few None seen/Few  Urinalysis, Complete  Result Value Ref Range   Specific Gravity, UA 1.020 1.005 - 1.030   pH, UA 7.0 5.0 - 7.5   Color, UA Yellow Yellow    Appearance Ur Clear Clear   Leukocytes, UA 1+ (A) Negative   Protein, UA Negative Negative/Trace   Glucose, UA Negative Negative   Ketones, UA Negative Negative   RBC, UA Trace (A) Negative   Bilirubin, UA Negative Negative   Urobilinogen, Ur 0.2 0.2 - 1.0 mg/dL   Nitrite, UA Negative Negative   Microscopic Examination See below:       Assessment & Plan:   1. Dysuria - Urine Culture - Urinalysis, Complete  2. Atrophic vaginitis - clotrimazole-betamethasone (LOTRISONE) cream; Apply 1 application topically 2 (two) times daily.  Dispense: 90 g; Refill: 6  3. Acute cystitis without hematuria - ciprofloxacin (CIPRO) 500 MG tablet; Take 1 tablet (500 mg total) by mouth 2 (two) times daily.  Dispense: 20 tablet; Refill: 0 - Microscopic Examination   Continue all other maintenance medications as listed above.  Follow up plan: Follow-up as needed or worsening of symptoms. Call office for any issues. Consider gyn referral if unresolved  Educational handout given for uti  Terald Sleeper PA-C [place] [address]  [place], [place] [address] [phone]   09/18/2016, 9:38 PM

## 2016-09-20 LAB — URINE CULTURE

## 2016-09-23 ENCOUNTER — Ambulatory Visit: Payer: Medicare Other | Admitting: Pharmacist

## 2016-10-14 ENCOUNTER — Encounter: Payer: Self-pay | Admitting: Pharmacist

## 2016-10-14 ENCOUNTER — Ambulatory Visit (INDEPENDENT_AMBULATORY_CARE_PROVIDER_SITE_OTHER): Payer: Medicare Other | Admitting: Pharmacist

## 2016-10-14 DIAGNOSIS — I2510 Atherosclerotic heart disease of native coronary artery without angina pectoris: Secondary | ICD-10-CM | POA: Diagnosis not present

## 2016-10-14 DIAGNOSIS — E782 Mixed hyperlipidemia: Secondary | ICD-10-CM

## 2016-10-14 NOTE — Progress Notes (Signed)
Patient ID: BAY Gabriela Smith, female   DOB: [birth year], 76 y.o.   MRN: [id number]   Subjective:    Gabriela Smith is a 76 y.o. female who has been referred by her PCP to discuss hyperlipidemia.  Dr Laurance Flatten would like her to consider PCSK9 therapy but patient is concerned about cost and it causing her to reach Medicare Coverage Gap.  She has tried several statins in the past and has had severe myalgias.  She current is taking generic Zetia.  Although she has also reported myalgias with ezetimibe in that past, she reports that she is tolerating well currently.  She has been taking ezetimibe for about 6 weeks now.  The patient does use medications that may worsen dyslipidemias (corticosteroids, progestins, anabolic steroids, diuretics, beta-blockers, amiodarone, cyclosporine, olanzapine). Exercise: rarely and three times a week. Previous history of cardiac disease includes: CAD - with angioplasty / PCTA in 1993  Statins tried:   Atorvastatin 20mg  qod 10/2014 through 01/2015 - stopped due to myalgias             Livalo 2mg  12/2013 through 03/2014 - myalgias             Pravastatin 10mg  - 2012 - myalgias             Rosuvastatin 5mg  2x/week - 08/2012 - myalgias             Simvastatin - tried in past but not sure of dose or duration; caused myalgias  Cardiac Risk Factors Age > 45-female, > 55-female:  YES  +1  Smoking:   NO  Sig. family hx of CHD*:  YES  +1  Hypertension:   YES  +1  Diabetes:   NO  HDL < 35:   NO  HDL > 59:   NO  Total:  3   *Significant family history of CHD per NCEP = MI or sudden death at less than 27 year old in father or other 1st-degree female relative, or less than 25 year old in mother or  other 1st-degree female relative  Diet - patient is following low fat diet  The following portions of the patient's history were reviewed and updated as appropriate: allergies, current medications, past family history, past medical history, past social history, past surgical history and  problem list.   Objective:   Lipid Panel     Component Value Date/Time   CHOL 279 (H) 07/31/2016 0942   CHOL 213 (H) 06/28/2015 1448   TRIG 414 (H) 07/31/2016 0942   HDL 49 07/31/2016 0942   CHOLHDL 3.4 06/28/2015 1448   LDLCALC 116 (H) 06/28/2015 1448   LDLCALC 205 (H) 11/28/2013 1215   LDLDIRECT 158 (H) 03/26/2015 0851   BMP Latest Ref Rng & Units 07/31/2016 02/28/2016 12/26/2015  Glucose 65 - 99 mg/dL 102(H) 92 117(H)  BUN 8 - 27 mg/dL 14 14 10   Creatinine 0.57 - 1.00 mg/dL 0.76 0.71 0.61  BUN/Creat Ratio 12 - 28 18 20  -  Sodium 134 - 144 mmol/L 139 142 138  Potassium 3.5 - 5.2 mmol/L 4.7 4.0 3.8  Chloride 96 - 106 mmol/L 97 100 108  CO2 18 - 29 mmol/L 24 26 24   Calcium 8.7 - 10.3 mg/dL 9.4 9.2 8.6(L)    Assessment:    Dyslipidemia as detailed above with history of antioplasty 1993 and very significant family history (children) of CVD  Target levels for LDL are: < 70 mg/dl ("very high" risk for CHD)  Plan:    The  following changes are planned for the next 6 months, at which time the patient will return for repeat fasting lipids:  1. Dietary changes: continue to limit saturated fat intake.  Suggested Mediterranean Diet with lots of fruits and vegetables, increase whole grains and beans, limit red meat, increase fish, limit refined sugar / sweets, limit saturated and trans fat, moderate amounts of monounsaturated fat and polyunsaturated fat 2. Exercise changes:  Continue to exercise - goal is at least 150 minutes per week 3. Lipid-lowering medications : continue ezetimibe 10mg  qd.  Patient is to RTC within the next week to have labs drawn to determine effectiveness.  4.  Discussed PSK9 which I think is a great option for patient.  She declined in past due to cost.  We have discussed possible patient assistance options.  She agrees to trial of Repatha is LDL is not at goal when lipids checked next week  Orders Placed This Encounter  Procedures  . Lipid panel    Standing  Status:   Future    Standing Expiration Date:   11/14/2016  . LDL Cholesterol, Direct    Standing Status:   Future    Standing Expiration Date:   11/14/2016     Cherre Robins, PharmD, CPP, CDE

## 2016-10-21 ENCOUNTER — Other Ambulatory Visit: Payer: Medicare Other

## 2016-10-21 DIAGNOSIS — E782 Mixed hyperlipidemia: Secondary | ICD-10-CM

## 2016-10-21 DIAGNOSIS — I2510 Atherosclerotic heart disease of native coronary artery without angina pectoris: Secondary | ICD-10-CM

## 2016-10-22 LAB — LIPID PANEL
Chol/HDL Ratio: 5.2 ratio — ABNORMAL HIGH (ref 0.0–4.4)
Cholesterol, Total: 276 mg/dL — ABNORMAL HIGH (ref 100–199)
HDL: 53 mg/dL (ref >39)
LDL Calculated: 169 mg/dL — ABNORMAL HIGH (ref 0–99)
Triglycerides: 271 mg/dL — ABNORMAL HIGH (ref 0–149)
VLDL Cholesterol Cal: 54 mg/dL — ABNORMAL HIGH (ref 5–40)

## 2016-10-22 LAB — LDL CHOLESTEROL, DIRECT: LDL Direct: 193 mg/dL — ABNORMAL HIGH (ref 0–99)

## 2016-10-22 MED ORDER — ICOSAPENT ETHYL 1 G PO CAPS: 1.0000 | ORAL_CAPSULE | Freq: Two times a day (BID) | ORAL | 4 refills | Status: DC

## 2016-11-20 DIAGNOSIS — X32XXXD Exposure to sunlight, subsequent encounter: Secondary | ICD-10-CM | POA: Diagnosis not present

## 2016-11-20 DIAGNOSIS — C44722 Squamous cell carcinoma of skin of right lower limb, including hip: Secondary | ICD-10-CM | POA: Diagnosis not present

## 2016-11-20 DIAGNOSIS — I872 Venous insufficiency (chronic) (peripheral): Secondary | ICD-10-CM | POA: Diagnosis not present

## 2016-11-20 DIAGNOSIS — L570 Actinic keratosis: Secondary | ICD-10-CM | POA: Diagnosis not present

## 2016-12-12 ENCOUNTER — Other Ambulatory Visit: Payer: Self-pay | Admitting: Family Medicine

## 2016-12-15 DIAGNOSIS — R8271 Bacteriuria: Secondary | ICD-10-CM | POA: Diagnosis not present

## 2016-12-15 DIAGNOSIS — Z8551 Personal history of malignant neoplasm of bladder: Secondary | ICD-10-CM | POA: Diagnosis not present

## 2016-12-16 ENCOUNTER — Ambulatory Visit (INDEPENDENT_AMBULATORY_CARE_PROVIDER_SITE_OTHER): Payer: Medicare Other

## 2016-12-16 DIAGNOSIS — Z23 Encounter for immunization: Secondary | ICD-10-CM

## 2016-12-31 DIAGNOSIS — Z96641 Presence of right artificial hip joint: Secondary | ICD-10-CM | POA: Diagnosis not present

## 2016-12-31 DIAGNOSIS — Z96652 Presence of left artificial knee joint: Secondary | ICD-10-CM | POA: Diagnosis not present

## 2016-12-31 DIAGNOSIS — Z471 Aftercare following joint replacement surgery: Secondary | ICD-10-CM | POA: Diagnosis not present

## 2017-01-01 DIAGNOSIS — L820 Inflamed seborrheic keratosis: Secondary | ICD-10-CM | POA: Diagnosis not present

## 2017-01-01 DIAGNOSIS — C44722 Squamous cell carcinoma of skin of right lower limb, including hip: Secondary | ICD-10-CM | POA: Diagnosis not present

## 2017-01-01 DIAGNOSIS — Z08 Encounter for follow-up examination after completed treatment for malignant neoplasm: Secondary | ICD-10-CM | POA: Diagnosis not present

## 2017-01-01 DIAGNOSIS — Z85828 Personal history of other malignant neoplasm of skin: Secondary | ICD-10-CM | POA: Diagnosis not present

## 2017-01-19 DIAGNOSIS — Z08 Encounter for follow-up examination after completed treatment for malignant neoplasm: Secondary | ICD-10-CM | POA: Diagnosis not present

## 2017-01-19 DIAGNOSIS — Z85828 Personal history of other malignant neoplasm of skin: Secondary | ICD-10-CM | POA: Diagnosis not present

## 2017-01-19 DIAGNOSIS — L820 Inflamed seborrheic keratosis: Secondary | ICD-10-CM | POA: Diagnosis not present

## 2017-02-11 ENCOUNTER — Ambulatory Visit (INDEPENDENT_AMBULATORY_CARE_PROVIDER_SITE_OTHER): Payer: Medicare Other

## 2017-02-11 ENCOUNTER — Encounter: Payer: Self-pay | Admitting: Family Medicine

## 2017-02-11 ENCOUNTER — Ambulatory Visit (INDEPENDENT_AMBULATORY_CARE_PROVIDER_SITE_OTHER): Payer: Medicare Other | Admitting: Family Medicine

## 2017-02-11 VITALS — BP 138/88 | HR 85 | Temp 97.2°F | Ht 64.0 in | Wt 184.0 lb

## 2017-02-11 DIAGNOSIS — Z789 Other specified health status: Secondary | ICD-10-CM

## 2017-02-11 DIAGNOSIS — I10 Essential (primary) hypertension: Secondary | ICD-10-CM

## 2017-02-11 DIAGNOSIS — I714 Abdominal aortic aneurysm, without rupture, unspecified: Secondary | ICD-10-CM

## 2017-02-11 DIAGNOSIS — E782 Mixed hyperlipidemia: Secondary | ICD-10-CM | POA: Diagnosis not present

## 2017-02-11 DIAGNOSIS — E559 Vitamin D deficiency, unspecified: Secondary | ICD-10-CM

## 2017-02-11 DIAGNOSIS — I2510 Atherosclerotic heart disease of native coronary artery without angina pectoris: Secondary | ICD-10-CM

## 2017-02-11 DIAGNOSIS — Z96641 Presence of right artificial hip joint: Secondary | ICD-10-CM

## 2017-02-11 DIAGNOSIS — I700 Atherosclerosis of aorta: Secondary | ICD-10-CM

## 2017-02-11 DIAGNOSIS — C679 Malignant neoplasm of bladder, unspecified: Secondary | ICD-10-CM | POA: Diagnosis not present

## 2017-02-11 HISTORY — DX: Atherosclerosis of aorta: I70.0

## 2017-02-11 MED ORDER — HYDROCHLOROTHIAZIDE 12.5 MG PO CAPS: 12.5000 mg | ORAL_CAPSULE | Freq: Two times a day (BID) | ORAL | 3 refills | Status: DC

## 2017-02-11 NOTE — Progress Notes (Signed)
Subjective:    Patient ID: Gabriela Smith, female    DOB: 1940-09-03, 76 y.o.   MRN: [id number]  HPI Pt here for follow up and management of chronic medical problems which includes hypertension and hyperlipidemia. She is taking medication regularly.  Patient is doing well overall.  She does have a history of bladder cancer and is followed regularly by the urologist.  She also has an abdominal aortic aneurysm  which is followed regularly.  She is requesting refills today on her HCTZ.  Her initial blood pressure was elevated today.  She has not had a chest x-ray and a good while and we will get one today and plans to schedule her mammogram.  She will also get lab work today.  She is doing well overall.  She sees the urologist because of the bladder cancer every 6 months now and she is several years out from the diagnosis and has had no recurrence.  She also sees the cardiologist periodically as there is a strong family history including her parents and 2 of her children of heart disease.  She denies any chest pain pressure or shortness of breath anymore than usual.  She does have occasional indigestion secondary to something she is eaten but otherwise no trouble swallowing blood in the stool or black tarry bowel movements.  Currently she is having no issues with voiding as mentioned she sees the urologist every 6 months.  Takes care of her husband who has worsening Parkinson's disease at home.  The patient had a myocardial perfusion scan prior to her right hip replacement about a year ago and this was normal.  He also has a history of an abdominal aortic aneurysm.  We will contact the cardiologist office and make sure that he sees her for routine purposes because of her strong family history of heart disease.    Patient Active Problem List   Diagnosis Date Noted  . Atrophic vaginitis 09/18/2016  . Acute cystitis without hematuria 09/18/2016  . S/P right THA, AA 12/25/2015  . S/P hip replacement  12/25/2015  . AAA (abdominal aortic aneurysm) without rupture ([place]) 08/24/2015  . Statin intolerance 05/23/2013  . Osteopenia 04/20/2013  . Degenerative disc disease, lumbar 02/08/2013  . Vitamin D deficiency 02/08/2013  . Overweight (BMI 25.0-29.9) 11/24/2012  . S/P left TKA 11/22/2012  . Bladder cancer ([place]) 11/04/2012  . Osteoarthritis of left knee 11/04/2012  . Hyperlipidemia LDL goal <70 11/28/2008  . Hypertension 11/28/2008  . CORONARY ATHEROSCLEROSIS NATIVE CORONARY ARTERY 11/28/2008   Outpatient Encounter Medications as of 02/11/2017  Medication Sig  . cholecalciferol (VITAMIN D) 1000 UNITS tablet Take 1,000 Units by mouth daily.   [name] Co-Enzyme Q10 200 MG CAPS Take 1 capsule by mouth daily.  . Flaxseed, Linseed, (FLAX SEED OIL) 1000 MG CAPS Take 1 capsule by mouth daily.   . hydrochlorothiazide (MICROZIDE) 12.5 MG capsule TAKE (1) CAPSULE DAILY  . Icosapent Ethyl (VASCEPA) 1 g CAPS Take 1 tablet by mouth 2 (two) times daily.  . metoprolol (LOPRESSOR) 50 MG tablet TAKE (1) TABLET TWICE A DAY.  . nitroGLYCERIN (NITROSTAT) 0.4 MG SL tablet Place 1 tablet (0.4 mg total) under the tongue every 5 (five) minutes as needed for chest pain.  [name] OVER THE COUNTER MEDICATION Tumeric, MVI and B12  . acetaminophen (TYLENOL) 500 MG tablet Take 2 tablets (1,000 mg total) by mouth every 8 (eight) hours. (Patient not taking: Reported on 02/11/2017)  . clotrimazole-betamethasone (LOTRISONE) cream Apply 1 application topically 2 (  two) times daily. (Patient not taking: Reported on 02/11/2017)  . [DISCONTINUED] ciprofloxacin (CIPRO) 500 MG tablet Take 1 tablet (500 mg total) by mouth 2 (two) times daily.  . [DISCONTINUED] ezetimibe (ZETIA) 10 MG tablet TAKE 1 TABLET DAILY   Facility-Administered Encounter Medications as of 02/11/2017  Medication  . DOBUTamine (DOBUTREX) 1,000 mcg/mL in dextrose 5% 250 mL infusion      Review of Systems  Constitutional: Negative.   HENT: Negative.   Eyes: Negative.     Respiratory: Negative.   Cardiovascular: Negative.   Gastrointestinal: Negative.   Endocrine: Negative.   Genitourinary: Negative.   Musculoskeletal: Negative.   Skin: Negative.   Allergic/Immunologic: Negative.   Neurological: Negative.   Hematological: Negative.   Psychiatric/Behavioral: Negative.        Objective:   Physical Exam  Constitutional: She is oriented to person, place, and time. She appears well-developed and well-nourished. No distress.  The patient is pleasant and doing well currently.  HENT:  Head: Normocephalic and atraumatic.  Right Ear: External ear normal.  Left Ear: External ear normal.  Nose: Nose normal.  Mouth/Throat: Oropharynx is clear and moist. No oropharyngeal exudate.  Eyes: Conjunctivae and EOM are normal. Pupils are equal, round, and reactive to light. Right eye exhibits no discharge. Left eye exhibits no discharge. No scleral icterus.  Neck: Normal range of motion. Neck supple. No thyromegaly present.  No bruits thyromegaly or anterior cervical adenopathy  Cardiovascular: Normal rate, regular rhythm, normal heart sounds and intact distal pulses.  No murmur heard. The heart has a regular rate and rhythm at 72/min  Pulmonary/Chest: Effort normal and breath sounds normal. No respiratory distress. She has no wheezes. She has no rales.  Clear anteriorly and posteriorly  Abdominal: Soft. Bowel sounds are normal. She exhibits no mass. There is no tenderness. There is no rebound and no guarding.  No abdominal tenderness masses inguinal adenopathy or bruits  Musculoskeletal: Normal range of motion. She exhibits no edema.  Lymphadenopathy:    She has no cervical adenopathy.  Neurological: She is alert and oriented to person, place, and time.  Skin: Skin is warm and dry. No rash noted.  Dry skin in general and recent excision of basal cell skin cancer from lower leg which appears to be healing appropriately.  This was redressed for the patient today.   Psychiatric: She has a normal mood and affect. Her behavior is normal. Judgment and thought content normal.  Nursing note and vitals reviewed.  BP (!) 150/68 (BP Location: Left Arm)   Pulse 85   Temp (!) 97.2 F (36.2 C) (Oral)   Ht _0  (1.626 m)   Wt 184 lb (83.5 kg)   BMI 31.58 kg/m         Assessment & Plan:  1. Mixed hyperlipidemia -The patient is statin intolerant and she will continue with as aggressive therapeutic lifestyle changes as possible.  She is also sensitive to omega-3 fatty acids. - CBC with Differential/Platelet - Lipid panel - DG Chest 2 View; Future  2. ASCVD (arteriosclerotic cardiovascular disease) -The patient does have an abdominal aortic aneurysm which is small.  She is not having any chest pain pressure or tightness but does have a very strong history of heart disease in both parents and actually and 2 of her children.  Unfortunately also she is statin intolerant. - CBC with Differential/Platelet - DG Chest 2 View; Future  3. Hypertension -The blood pressure is good today and she will continue with current treatment -  BMP8+EGFR - CBC with Differential/Platelet - Hepatic function panel - DG Chest 2 View; Future  4. Vitamin D deficiency -Continue with current treatment pending results of lab work - CBC with Differential/Platelet - VITAMIN D 25 Hydroxy (Vit-D Deficiency, Fractures)  5. Atherosclerosis of native coronary artery of native heart without angina pectoris -Follow-up with cardiology as planned - CBC with Differential/Platelet - Lipid panel  6. AAA (abdominal aortic aneurysm) without rupture [facility]) -Follow-up with cardiology as planned  7. Statin intolerance -Continue aggressive therapeutic lifestyle changes  8. Status post right hip replacement -Follow-up with orthopedic surgeon as needed  9. Malignant neoplasm of urinary bladder, unspecified site [facility]) -Follow-up with urologist every 6 months as planned  Meds ordered this  encounter  Medications  . hydrochlorothiazide (MICROZIDE) 12.5 MG capsule    Sig: Take 1 capsule (12.5 mg total) by mouth 2 (two) times daily.    Dispense:  180 capsule    Refill:  3   Patient Instructions                       Medicare Annual Wellness Visit  [facility] and the medical providers at [facility] strive to bring you the best medical care.  In doing so we not only want to address your current medical conditions and concerns but also to detect new conditions early and prevent illness, disease and health-related problems.    Medicare offers a yearly Wellness Visit which allows our clinical staff to assess your need for preventative services including immunizations, lifestyle education, counseling to decrease risk of preventable diseases and screening for fall risk and other medical concerns.    This visit is provided free of charge (no copay) for all Medicare recipients. The clinical pharmacists at [facility] have begun to conduct these Wellness Visits which will also include a thorough review of all your medications.    As you primary medical provider recommend that you make an appointment for your Annual Wellness Visit if you have not done so already this year.  You may set up this appointment before you leave today or you may call back ([phone]) and schedule an appointment.  Please make sure when you call that you mention that you are scheduling your Annual Wellness Visit with the clinical pharmacist so that the appointment may be made for the proper length of time.     Continue current medications. Continue good therapeutic lifestyle changes which include good diet and exercise. Fall precautions discussed with patient. If an FOBT was given today- please return it to our front desk. If you are over 57 years old - you may need Prevnar 61 or the adult Pneumonia vaccine.  **Flu shots are available--- please call and schedule a  FLU-CLINIC appointment**  After your visit with [place] today you will receive a survey in the mail or online from [employer] regarding your care with [place]. Please take a moment to fill this out. Your feedback is very important to [place] as you can help [place] better understand your patient needs as well as improve your experience and satisfaction. WE CARE ABOUT YOU!!!   We will arrange for you to have an appointment with a cardiologist for general follow-up because of the strong family history of heart disease Continue to follow-up with the urologist as per his recommendations Continue with aggressive therapeutic lifestyle changes since your statin intolerant this includes weight loss and exercise Continue to drink plenty of fluids and stay  well-hydrated Dr. Vicie Mutters is the hearing specialist that you will want to make an appointment to see in [place] on [facility]  Arrie Senate MD

## 2017-02-11 NOTE — Patient Instructions (Addendum)
Medicare Annual Wellness Visit  [facility] and the medical providers at [facility] strive to bring you the best medical care.  In doing so we not only want to address your current medical conditions and concerns but also to detect new conditions early and prevent illness, disease and health-related problems.    Medicare offers a yearly Wellness Visit which allows our clinical staff to assess your need for preventative services including immunizations, lifestyle education, counseling to decrease risk of preventable diseases and screening for fall risk and other medical concerns.    This visit is provided free of charge (no copay) for all Medicare recipients. The clinical pharmacists at [facility] have begun to conduct these Wellness Visits which will also include a thorough review of all your medications.    As you primary medical provider recommend that you make an appointment for your Annual Wellness Visit if you have not done so already this year.  You may set up this appointment before you leave today or you may call back ([phone]) and schedule an appointment.  Please make sure when you call that you mention that you are scheduling your Annual Wellness Visit with the clinical pharmacist so that the appointment may be made for the proper length of time.     Continue current medications. Continue good therapeutic lifestyle changes which include good diet and exercise. Fall precautions discussed with patient. If an FOBT was given today- please return it to our front desk. If you are over 78 years old - you may need Prevnar 33 or the adult Pneumonia vaccine.  **Flu shots are available--- please call and schedule a FLU-CLINIC appointment**  After your visit with [place] today you will receive a survey in the mail or online from [employer] regarding your care with [place]. Please take a moment to fill this out. Your feedback is very  important to [place] as you can help [place] better understand your patient needs as well as improve your experience and satisfaction. WE CARE ABOUT YOU!!!   We will arrange for you to have an appointment with a cardiologist for general follow-up because of the strong family history of heart disease Continue to follow-up with the urologist as per his recommendations Continue with aggressive therapeutic lifestyle changes since your statin intolerant this includes weight loss and exercise Continue to drink plenty of fluids and stay well-hydrated Dr. Vicie Mutters is the hearing specialist that you will want to make an appointment to see in [facility] on [address]

## 2017-02-12 LAB — LIPID PANEL
Chol/HDL Ratio: 6 ratio — ABNORMAL HIGH (ref 0.0–4.4)
Cholesterol, Total: 312 mg/dL — ABNORMAL HIGH (ref 100–199)
HDL: 52 mg/dL (ref >39)
LDL Calculated: 194 mg/dL — ABNORMAL HIGH (ref 0–99)
Triglycerides: 332 mg/dL — ABNORMAL HIGH (ref 0–149)
VLDL Cholesterol Cal: 66 mg/dL — ABNORMAL HIGH (ref 5–40)

## 2017-02-12 LAB — BMP8+EGFR
BUN/Creatinine Ratio: 16 (ref 12–28)
BUN: 13 mg/dL (ref 8–27)
CO2: 26 mmol/L (ref 20–29)
Calcium: 9.5 mg/dL (ref 8.7–10.3)
Chloride: 100 mmol/L (ref 96–106)
Creatinine, Ser: 0.81 mg/dL (ref 0.57–1.00)
GFR calc Af Amer: 80 mL/min/{1.73_m2} (ref >59)
GFR calc non Af Amer: 70 mL/min/{1.73_m2} (ref >59)
Glucose: 97 mg/dL (ref 65–99)
Potassium: 4.6 mmol/L (ref 3.5–5.2)
Sodium: 142 mmol/L (ref 134–144)

## 2017-02-12 LAB — CBC WITH DIFFERENTIAL/PLATELET
Basophils Absolute: 0 10*3/uL (ref 0.0–0.2)
Basos: 1 %
EOS (ABSOLUTE): 0.1 10*3/uL (ref 0.0–0.4)
Eos: 1 %
Hematocrit: 38 % (ref 34.0–46.6)
Hemoglobin: 13 g/dL (ref 11.1–15.9)
Immature Grans (Abs): 0 10*3/uL (ref 0.0–0.1)
Immature Granulocytes: 0 %
Lymphocytes Absolute: 2.3 10*3/uL (ref 0.7–3.1)
Lymphs: 33 %
MCH: 31.8 pg (ref 26.6–33.0)
MCHC: 34.2 g/dL (ref 31.5–35.7)
MCV: 93 fL (ref 79–97)
Monocytes Absolute: 0.6 10*3/uL (ref 0.1–0.9)
Monocytes: 9 %
Neutrophils Absolute: 4.1 10*3/uL (ref 1.4–7.0)
Neutrophils: 56 %
Platelets: 260 10*3/uL (ref 150–379)
RBC: 4.09 x10E6/uL (ref 3.77–5.28)
RDW: 13.7 % (ref 12.3–15.4)
WBC: 7.1 10*3/uL (ref 3.4–10.8)

## 2017-02-12 LAB — HEPATIC FUNCTION PANEL
ALT: 18 IU/L (ref 0–32)
AST: 20 IU/L (ref 0–40)
Albumin: 4.6 g/dL (ref 3.5–4.8)
Alkaline Phosphatase: 65 IU/L (ref 39–117)
Bilirubin Total: 0.3 mg/dL (ref 0.0–1.2)
Bilirubin, Direct: 0.09 mg/dL (ref 0.00–0.40)
Total Protein: 7.2 g/dL (ref 6.0–8.5)

## 2017-02-12 LAB — VITAMIN D 25 HYDROXY (VIT D DEFICIENCY, FRACTURES): Vit D, 25-Hydroxy: 35 ng/mL (ref 30.0–100.0)

## 2017-02-17 ENCOUNTER — Other Ambulatory Visit: Payer: Self-pay

## 2017-02-17 DIAGNOSIS — E785 Hyperlipidemia, unspecified: Secondary | ICD-10-CM

## 2017-02-17 MED ORDER — ROSUVASTATIN CALCIUM 5 MG PO TABS: ORAL_TABLET | ORAL | 1 refills | Status: DC

## 2017-03-23 ENCOUNTER — Other Ambulatory Visit: Payer: Medicare Other

## 2017-03-23 DIAGNOSIS — Z1211 Encounter for screening for malignant neoplasm of colon: Secondary | ICD-10-CM | POA: Diagnosis not present

## 2017-03-25 LAB — FECAL OCCULT BLOOD, IMMUNOCHEMICAL: Fecal Occult Bld: NEGATIVE

## 2017-04-02 ENCOUNTER — Other Ambulatory Visit: Payer: Self-pay | Admitting: Family Medicine

## 2017-04-13 ENCOUNTER — Other Ambulatory Visit: Payer: Self-pay | Admitting: Family Medicine

## 2017-04-13 DIAGNOSIS — Z1231 Encounter for screening mammogram for malignant neoplasm of breast: Secondary | ICD-10-CM

## 2017-04-29 ENCOUNTER — Ambulatory Visit: Payer: Medicare Other

## 2017-05-11 NOTE — Progress Notes (Signed)
Cardiology Office Note   Date:  05/13/2017   ID:  Gabriela, Smith 04-19-1940, MRN [id number]  PCP:  Chipper Herb, MD  Cardiologist:   Minus Breeding, MD   Chief Complaint  Patient presents with  . Coronary Artery Disease      History of Present Illness: Gabriela Smith is a 77 y.o. female who presents for evaluation of CAD.  She has a past history of PCI.  I saw her last in 2017.  The patient had an intermediate risk perfusion study.  This was followed by a normal stress echo.    Since I last saw her she has done well.  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  She just found out her daughter-in-law who is in her 33s has metastatic pancreatic cancer.  She walks up and down stairs routinely.  The patient denies any new symptoms such as chest discomfort, neck or arm discomfort. There has been no new shortness of breath, PND or orthopnea. There have been no reported palpitations, presyncope or syncope.     Past Medical History:  Diagnosis Date  . AAA (abdominal aortic aneurysm) [facility])    cardiology aware   . Arthritis    OA lt knee- cortizone inj. q 4 months AND OA ALSO IN BACK. Had knee replaced-issue resolved  . CAD (coronary artery disease) CARDIOLOGIST- [facility]--  VISIT 01-02-10 IN EPIC   1993-- PTCA. Pt describes a near total blockage of apparently the LAD and a 65% stenosis elsewhere.  . Cancer ([place])   . History of bladder cancer followed by dr Karsten Ro   hx  TCC of bladder ,  Ta G3-  first occurence 02-20-2012--  s/p BCG tx's  and  mitomycin C  . Hyperlipidemia 1993  . Hypertension 1993  . Nocturia   . Osteopenia   . Sigmoid diverticulosis     Past Surgical History:  Procedure Laterality Date  . CHOLECYSTECTOMY  2003 (approx)  . COLONOSCOPY  05-31-2003  . CORONARY ANGIOPLASTY  1993   to LAD  . CYSTOSCOPY WITH BIOPSY  05/09/2011   Procedure: CYSTOSCOPY WITH BIOPSY;  Surgeon: Claybon Jabs, MD;  Location: [facility];   Service: Urology;  Laterality: N/A;  WITH BLADDER BIOPSY GYRUS  . TOTAL HIP ARTHROPLASTY Right 12/25/2015   Procedure: RIGHT TOTAL HIP ARTHROPLASTY ANTERIOR APPROACH;  Surgeon: Paralee Cancel, MD;  Location: [facility];  Service: Orthopedics;  Laterality: Right;  . TOTAL KNEE ARTHROPLASTY Left 11/22/2012   Procedure: LEFT TOTAL KNEE ARTHROPLASTY;  Surgeon: Mauri Pole, MD;  Location: [facility];  Service: Orthopedics;  Laterality: Left;  . TRANSURETHRAL RESECTION OF BLADDER TUMOR  03/14/2011   Procedure: TRANSURETHRAL RESECTION OF BLADDER TUMOR (TURBT);  Surgeon: Claybon Jabs, MD;  Location: [facility];  Service: Urology;  Laterality: N/A;  . TRANSURETHRAL RESECTION OF BLADDER TUMOR  05/09/2011   Procedure: TRANSURETHRAL RESECTION OF BLADDER TUMOR (TURBT);  Surgeon: Claybon Jabs, MD;  Location: [facility];  Service: Urology;  Laterality: N/A;  . TRANSURETHRAL RESECTION OF BLADDER TUMOR  03/08/2012   Procedure: TRANSURETHRAL RESECTION OF BLADDER TUMOR (TURBT);  Surgeon: Claybon Jabs, MD;  Location: [facility];  Service: Urology;  Laterality: N/A;  . TRANSURETHRAL RESECTION OF BLADDER TUMOR WITH GYRUS (TURBT-GYRUS) N/A 03/24/2014   Procedure: TRANSURETHRAL RESECTION OF BLADDER TUMOR WITH GYRUS (TURBT-GYRUS);  Surgeon: Claybon Jabs, MD;  Location: [facility];  Service:  Urology;  Laterality: N/A;  . UMBILICAL HERNIA REPAIR  2001  (approx)     Current Outpatient Medications  Medication Sig Dispense Refill  . cholecalciferol (VITAMIN D) 1000 UNITS tablet Take 1,000 Units by mouth daily.     [name] Co-Enzyme Q10 200 MG CAPS Take 1 capsule by mouth daily.    . Flaxseed, Linseed, (FLAX SEED OIL) 1000 MG CAPS Take 1 capsule by mouth daily.     . hydrochlorothiazide (MICROZIDE) 12.5 MG capsule Take 1 capsule (12.5 mg total) by mouth 2 (two) times daily. 180 capsule 3  . metoprolol (LOPRESSOR) 50 MG tablet TAKE (1) TABLET TWICE A DAY. 180 tablet 3  .  nitroGLYCERIN (NITROSTAT) 0.4 MG SL tablet Place 1 tablet (0.4 mg total) under the tongue every 5 (five) minutes as needed for chest pain. 50 tablet 3  . OVER THE COUNTER MEDICATION Tumeric, MVI and B12    . acetaminophen (TYLENOL) 500 MG tablet Take 2 tablets (1,000 mg total) by mouth every 8 (eight) hours. (Patient not taking: Reported on 02/11/2017) 30 tablet 0  . clotrimazole-betamethasone (LOTRISONE) cream Apply 1 application topically 2 (two) times daily. (Patient not taking: Reported on 02/11/2017) 90 g 6  . Icosapent Ethyl (VASCEPA) 1 g CAPS Take 1 tablet by mouth 2 (two) times daily. 60 capsule 4  . metoprolol tartrate (LOPRESSOR) 50 MG tablet TAKE (1) TABLET TWICE A DAY. 180 tablet 0  . rosuvastatin (CRESTOR) 5 MG tablet One tablet daily on Monday, Wednesday and Friday 30 tablet 1   No current facility-administered medications for this visit.    Facility-Administered Medications Ordered in Other Visits  Medication Dose Route Frequency Provider Last Rate Last Dose  . DOBUTamine (DOBUTREX) 1,000 mcg/mL in dextrose 5% 250 mL infusion  20 mcg/kg/min Intravenous Continuous Dorothy Spark, MD 96.4 mL/hr at 12/24/15 0840 20 mcg/kg/min at 12/24/15 0840    Allergies:   Fish allergy; Lasix [furosemide]; Macrodantin [nitrofurantoin macrocrystal]; Vicodin [hydrocodone-acetaminophen]; Zetia [ezetimibe]; Codeine; Latex; Niaspan [niacin er]; Statins; and Sulfa antibiotics    ROS:  Please see the history of present illness.   Otherwise, review of systems are positive for none.   All other systems are reviewed and negative.    PHYSICAL EXAM: VS:  BP 140/88   Pulse (!) 104   Ht 5\' 4"  (1.626 m)   Wt 180 lb (81.6 kg)   BMI 30.90 kg/m  , BMI Body mass index is 30.9 kg/m.  GENERAL:  Well appearing NECK:  No jugular venous distention, waveform within normal limits, carotid upstroke brisk and symmetric, no bruits, no thyromegaly LUNGS:  Clear to auscultation bilaterally CHEST:   Unremarkable HEART:  PMI not displaced or sustained,S1 and S2 within normal limits, no S3, no S4, no clicks, no rubs, no murmurs ABD:  Flat, positive bowel sounds normal in frequency in pitch, no bruits, no rebound, no guarding, no midline pulsatile mass, no hepatomegaly, no splenomegaly EXT:  2 plus pulses throughout, no edema, no cyanosis no clubbing   EKG:  EKG is  ordered today. The ekg ordered today demonstrates NSR, rate 104, mildly PACs, axis WNL, intervals WNL, no acute ST T wave changes   Recent Labs: 02/11/2017: ALT 18; BUN 13; Creatinine, Ser 0.81; Hemoglobin 13.0; Platelets 260; Potassium 4.6; Sodium 142    Lipid Panel    Component Value Date/Time   CHOL 312 (H) 02/11/2017 1000   TRIG 332 (H) 02/11/2017 1000   TRIG 414 (H) 07/31/2016 0942   HDL 52 02/11/2017 1000  HDL 49 07/31/2016 0942   CHOLHDL 6.0 (H) 02/11/2017 1000   LDLCALC 194 (H) 02/11/2017 1000   LDLCALC 205 (H) 11/28/2013 1215   LDLDIRECT 193 (H) 10/21/2016 0902      Wt Readings from Last 3 Encounters:  05/13/17 180 lb (81.6 kg)  02/11/17 184 lb (83.5 kg)  09/18/16 183 lb (83 kg)      Other studies Reviewed: Additional studies/ records that were reviewed today include: Labs Review of the above records demonstrates:  Please see elsewhere in the note.     ASSESSMENT AND PLAN:  CAD: She is had no new symptoms since stress perfusion study in 2017.  She needs continued risk reduction.  AAA:   This was 2.4 x 2.7 two years ago and I will repeat an ultrasound.    DYSLIPIDEMIA:   She has been truly intolerant of statins.  She agrees to go to our Lipid Clinic to consider a PCSK9.   PACs:  She does not feel these.  No change in therapy.   HTN:  BP is borderline.  However, I reviewed her blood pressure diary and this is well controlled at home.  No change in therapy.  Current medicines are reviewed at length with the patient today.  The patient does not have concerns regarding medicines.  The following  changes have been made:  None  Labs/ tests ordered today include:   AAA Duplex   Disposition:   FU with me in 12 months.      Signed, Minus Breeding, MD  05/13/2017 2:24 PM    [facility]

## 2017-05-13 ENCOUNTER — Encounter: Payer: Self-pay | Admitting: Cardiology

## 2017-05-13 ENCOUNTER — Ambulatory Visit (INDEPENDENT_AMBULATORY_CARE_PROVIDER_SITE_OTHER): Payer: Medicare Other | Admitting: Cardiology

## 2017-05-13 VITALS — BP 140/88 | HR 104 | Ht 64.0 in | Wt 180.0 lb

## 2017-05-13 DIAGNOSIS — E785 Hyperlipidemia, unspecified: Secondary | ICD-10-CM

## 2017-05-13 DIAGNOSIS — I2510 Atherosclerotic heart disease of native coronary artery without angina pectoris: Secondary | ICD-10-CM | POA: Diagnosis not present

## 2017-05-13 DIAGNOSIS — I10 Essential (primary) hypertension: Secondary | ICD-10-CM

## 2017-05-13 DIAGNOSIS — I714 Abdominal aortic aneurysm, without rupture, unspecified: Secondary | ICD-10-CM

## 2017-05-13 NOTE — Patient Instructions (Signed)
Medication Instructions:  The current medical regimen is effective;  continue present plan and medications.  Testing/Procedures: Your physician has requested that you have an abdominal aorta duplex. During this test, an ultrasound is used to evaluate the aorta. Allow 30 minutes for this exam. Do not eat after midnight the day before and avoid carbonated beverages  You have been referred to the Lipid Clinic at the [facility] office.  Follow-Up: Follow up in 1 year with Dr. Percival Spanish.  You will receive a letter in the mail 2 months before you are due.  Please call us when you receive this letter to schedule your follow up appointment.  If you need a refill on your cardiac medications before your next appointment, please call your pharmacy.  Thank you for choosing [facility]!!

## 2017-05-20 ENCOUNTER — Ambulatory Visit
Admission: RE | Admit: 2017-05-20 | Discharge: 2017-05-20 | Disposition: A | Discharge status: Home / Self Care | Payer: Medicare Other | Source: Ambulatory Visit | Attending: Family Medicine | Admitting: Family Medicine

## 2017-05-20 DIAGNOSIS — Z1231 Encounter for screening mammogram for malignant neoplasm of breast: Secondary | ICD-10-CM | POA: Diagnosis not present

## 2017-05-22 ENCOUNTER — Other Ambulatory Visit: Payer: Self-pay | Admitting: Cardiology

## 2017-05-22 DIAGNOSIS — I714 Abdominal aortic aneurysm, without rupture, unspecified: Secondary | ICD-10-CM

## 2017-05-29 ENCOUNTER — Other Ambulatory Visit: Payer: Self-pay | Admitting: *Deleted

## 2017-05-29 ENCOUNTER — Telehealth: Payer: Self-pay | Admitting: Family Medicine

## 2017-05-29 DIAGNOSIS — E7800 Pure hypercholesterolemia, unspecified: Secondary | ICD-10-CM

## 2017-05-29 NOTE — Telephone Encounter (Signed)
Pt called - she wants lipids rechecked - orders placed

## 2017-05-29 NOTE — Telephone Encounter (Signed)
Pt is wanting to know if she can come in for another cholesterol check before she starts the repetha injection

## 2017-06-01 ENCOUNTER — Other Ambulatory Visit: Payer: Medicare Other

## 2017-06-01 DIAGNOSIS — E7800 Pure hypercholesterolemia, unspecified: Secondary | ICD-10-CM

## 2017-06-01 LAB — LIPID PANEL
Chol/HDL Ratio: 6.1 ratio — ABNORMAL HIGH (ref 0.0–4.4)
Cholesterol, Total: 322 mg/dL — ABNORMAL HIGH (ref 100–199)
HDL: 53 mg/dL (ref >39)
LDL Calculated: 211 mg/dL — ABNORMAL HIGH (ref 0–99)
Triglycerides: 292 mg/dL — ABNORMAL HIGH (ref 0–149)
VLDL Cholesterol Cal: 58 mg/dL — ABNORMAL HIGH (ref 5–40)

## 2017-06-03 ENCOUNTER — Other Ambulatory Visit (HOSPITAL_COMMUNITY): Payer: Medicare Other

## 2017-06-05 ENCOUNTER — Telehealth: Payer: Self-pay | Admitting: Family Medicine

## 2017-06-05 ENCOUNTER — Ambulatory Visit (HOSPITAL_COMMUNITY)
Admission: RE | Admit: 2017-06-05 | Discharge: 2017-06-05 | Disposition: A | Discharge status: Home / Self Care | Payer: Medicare Other | Source: Ambulatory Visit | Attending: Cardiology | Admitting: Cardiology

## 2017-06-05 DIAGNOSIS — I10 Essential (primary) hypertension: Secondary | ICD-10-CM | POA: Diagnosis not present

## 2017-06-05 DIAGNOSIS — I714 Abdominal aortic aneurysm, without rupture, unspecified: Secondary | ICD-10-CM

## 2017-06-05 DIAGNOSIS — E785 Hyperlipidemia, unspecified: Secondary | ICD-10-CM | POA: Diagnosis not present

## 2017-06-05 DIAGNOSIS — E669 Obesity, unspecified: Secondary | ICD-10-CM | POA: Insufficient documentation

## 2017-06-05 DIAGNOSIS — Z87891 Personal history of nicotine dependence: Secondary | ICD-10-CM | POA: Insufficient documentation

## 2017-06-05 DIAGNOSIS — I2510 Atherosclerotic heart disease of native coronary artery without angina pectoris: Secondary | ICD-10-CM | POA: Insufficient documentation

## 2017-06-06 NOTE — Telephone Encounter (Signed)
Pt aware of results 

## 2017-06-10 ENCOUNTER — Telehealth: Payer: Self-pay | Admitting: Cardiology

## 2017-06-10 NOTE — Telephone Encounter (Signed)
Did not need this encounter °

## 2017-06-23 ENCOUNTER — Encounter: Payer: Self-pay | Admitting: Family Medicine

## 2017-06-23 ENCOUNTER — Ambulatory Visit (INDEPENDENT_AMBULATORY_CARE_PROVIDER_SITE_OTHER): Payer: Medicare Other | Admitting: Family Medicine

## 2017-06-23 VITALS — BP 140/81 | HR 85 | Temp 97.4°F | Ht 64.0 in | Wt 186.0 lb

## 2017-06-23 DIAGNOSIS — I8391 Asymptomatic varicose veins of right lower extremity: Secondary | ICD-10-CM

## 2017-06-23 NOTE — Progress Notes (Signed)
Chief Complaint  Patient presents with  . Skin Problem    pt here today c/o "place on right lower leg" that she wants looked at    HPI  Patient presents today for had a placed pop up on her right shin that is unusual for her.  She has moles and freckles but none that were dark.  This just popped up several days ago and she has a family history of melanoma.  Believe she says her mother died from it.  PMH: Smoking status noted ROS: Per HPI  Objective: BP 140/81   Pulse 85   Temp (!) 97.4 F (36.3 C) (Oral)   Ht 5\' 4"  (1.626 m)   Wt 186 lb (84.4 kg)   BMI 31.93 kg/m  Gen: NAD, alert, cooperative with exam Ext: No edema, warm.  Multiple varicosities noted over the legs.  There is a blue black irregular shaped lesion subcutaneous measuring 8 mm in its greatest diameter.  This represents a ruptured varicosity. Neuro: Alert and oriented, No gross deficits  Assessment and plan:  1. Asymptomatic varicose veins of right lower extremity     Patient reassured.  As within skin lesion I suggested that she follow it for change in size shape and color.  Should that occur should be reevaluated.   Follow up as needed.  Claretta Fraise, MD

## 2017-07-06 ENCOUNTER — Other Ambulatory Visit: Payer: Self-pay | Admitting: Family Medicine

## 2017-07-06 ENCOUNTER — Ambulatory Visit: Payer: Medicare Other | Admitting: Internal Medicine

## 2017-07-07 DIAGNOSIS — Z8551 Personal history of malignant neoplasm of bladder: Secondary | ICD-10-CM | POA: Diagnosis not present

## 2017-07-13 ENCOUNTER — Other Ambulatory Visit: Payer: Self-pay | Admitting: *Deleted

## 2017-07-13 MED ORDER — NITROGLYCERIN 0.4 MG SL SUBL: 0.4000 mg | SUBLINGUAL_TABLET | SUBLINGUAL | 0 refills | Status: DC | PRN

## 2017-07-30 ENCOUNTER — Ambulatory Visit: Payer: Medicare Other | Admitting: Internal Medicine

## 2017-08-19 ENCOUNTER — Ambulatory Visit (INDEPENDENT_AMBULATORY_CARE_PROVIDER_SITE_OTHER): Payer: Medicare Other

## 2017-08-19 ENCOUNTER — Ambulatory Visit (INDEPENDENT_AMBULATORY_CARE_PROVIDER_SITE_OTHER): Payer: Medicare Other | Admitting: Family Medicine

## 2017-08-19 ENCOUNTER — Encounter: Payer: Self-pay | Admitting: Family Medicine

## 2017-08-19 VITALS — BP 124/63 | HR 82 | Temp 97.2°F | Ht 64.0 in | Wt 181.0 lb

## 2017-08-19 DIAGNOSIS — I2510 Atherosclerotic heart disease of native coronary artery without angina pectoris: Secondary | ICD-10-CM | POA: Diagnosis not present

## 2017-08-19 DIAGNOSIS — Z789 Other specified health status: Secondary | ICD-10-CM | POA: Diagnosis not present

## 2017-08-19 DIAGNOSIS — Z6831 Body mass index (BMI) 31.0-31.9, adult: Secondary | ICD-10-CM

## 2017-08-19 DIAGNOSIS — M85852 Other specified disorders of bone density and structure, left thigh: Secondary | ICD-10-CM | POA: Diagnosis not present

## 2017-08-19 DIAGNOSIS — Z780 Asymptomatic menopausal state: Secondary | ICD-10-CM | POA: Diagnosis not present

## 2017-08-19 DIAGNOSIS — E785 Hyperlipidemia, unspecified: Secondary | ICD-10-CM | POA: Diagnosis not present

## 2017-08-19 DIAGNOSIS — I739 Peripheral vascular disease, unspecified: Secondary | ICD-10-CM | POA: Diagnosis not present

## 2017-08-19 DIAGNOSIS — I10 Essential (primary) hypertension: Secondary | ICD-10-CM | POA: Diagnosis not present

## 2017-08-19 DIAGNOSIS — I714 Abdominal aortic aneurysm, without rupture, unspecified: Secondary | ICD-10-CM

## 2017-08-19 DIAGNOSIS — E7800 Pure hypercholesterolemia, unspecified: Secondary | ICD-10-CM | POA: Diagnosis not present

## 2017-08-19 DIAGNOSIS — E559 Vitamin D deficiency, unspecified: Secondary | ICD-10-CM

## 2017-08-19 DIAGNOSIS — C679 Malignant neoplasm of bladder, unspecified: Secondary | ICD-10-CM | POA: Diagnosis not present

## 2017-08-19 DIAGNOSIS — Z0000 Encounter for general adult medical examination without abnormal findings: Secondary | ICD-10-CM

## 2017-08-19 MED ORDER — TRAMADOL HCL 50 MG PO TABS: 50.0000 mg | ORAL_TABLET | Freq: Every day | ORAL | 0 refills | Status: DC | PRN

## 2017-08-19 NOTE — Patient Instructions (Addendum)
Medicare Annual Wellness Visit  [facility] and the medical providers at [facility] strive to bring you the best medical care.  In doing so we not only want to address your current medical conditions and concerns but also to detect new conditions early and prevent illness, disease and health-related problems.    Medicare offers a yearly Wellness Visit which allows our clinical staff to assess your need for preventative services including immunizations, lifestyle education, counseling to decrease risk of preventable diseases and screening for fall risk and other medical concerns.    This visit is provided free of charge (no copay) for all Medicare recipients. The clinical pharmacists at [facility] have begun to conduct these Wellness Visits which will also include a thorough review of all your medications.    As you primary medical provider recommend that you make an appointment for your Annual Wellness Visit if you have not done so already this year.  You may set up this appointment before you leave today or you may call back ([phone]) and schedule an appointment.  Please make sure when you call that you mention that you are scheduling your Annual Wellness Visit with the clinical pharmacist so that the appointment may be made for the proper length of time.     Continue current medications. Continue good therapeutic lifestyle changes which include good diet and exercise. Fall precautions discussed with patient. If an FOBT was given today- please return it to our front desk. If you are over 16 years old - you may need Prevnar 45 or the adult Pneumonia vaccine.  **Flu shots are available--- please call and schedule a FLU-CLINIC appointment**  After your visit with [place] today you will receive a survey in the mail or online from [employer] regarding your care with [place]. Please take a moment to fill this out. Your feedback is very  important to [place] as you can help [place] better understand your patient needs as well as improve your experience and satisfaction. WE CARE ABOUT YOU!!!   Continue to follow-up with cardiology Continue to follow-up with urology We will call with results of DEXA scan as soon as those results become available Continue to walk regularly and stay active physically and follow diet closely in order to lose weight and improve circulation to your lower extremities

## 2017-08-19 NOTE — Progress Notes (Signed)
Subjective:    Patient ID: Gabriela Smith, female    DOB: [birth year], 77 y.o.   MRN: [id number]  HPI Pt here for follow up and management of chronic medical problems which includes hyperlipidemia. She is taking medication regularly.  Patient is doing well overall.  She will get lab work today including a DEXA scan.  She does see the cardiologist regularly and there is some discussion about getting her on Repatha.  She also would like to have a prescription of tramadol on hand to take as needed for severe pain.  Her body mass index is 31.20.  She does have a history of bladder cancer and is followed regularly for this.  Patient is pleasant and alert and looks much younger than her stated age of 77 years.  She has seen the cardiologist recently and got a good report.  She has plans to go and discuss Repatha but had to cancel that visit.  We will get our girl to look at that further with her.  She denies any chest pain pressure tightness or shortness of breath.  She denies any trouble with swallowing heartburn indigestion nausea vomiting diarrhea blood in the stool or black tarry bowel movements or change in bowel habits.  There is no family history of colon cancer.  Unfortunately she just lost her daughter-in-law at 79 years of age with pancreatic cancer.  She is passing her water well and has not seen any blood in the urine and gets a visit with her urologist, Dr. Karsten Ro every 6 months for follow-up of bladder cancer.  Since she is 77 we will look it may be giving her the Cologuard test and we will also have our nurse discussed with her Repatha and its cost.    Patient Active Problem List   Diagnosis Date Noted  . Aortic atherosclerosis ([employer]) 02/11/2017  . Atrophic vaginitis 09/18/2016  . Acute cystitis without hematuria 09/18/2016  . S/P right THA, AA 12/25/2015  . S/P hip replacement 12/25/2015  . AAA (abdominal aortic aneurysm) without rupture ([place]) 08/24/2015  . Statin intolerance 05/23/2013    . Osteopenia 04/20/2013  . Degenerative disc disease, lumbar 02/08/2013  . Vitamin D deficiency 02/08/2013  . Overweight (BMI 25.0-29.9) 11/24/2012  . S/P left TKA 11/22/2012  . Malignant neoplasm of urinary bladder ([place]) 11/04/2012  . Osteoarthritis of left knee 11/04/2012  . Hyperlipidemia LDL goal <70 11/28/2008  . Hypertension 11/28/2008  . CORONARY ATHEROSCLEROSIS NATIVE CORONARY ARTERY 11/28/2008   Outpatient Encounter Medications as of 08/19/2017  Medication Sig  . acetaminophen (TYLENOL) 500 MG tablet Take 2 tablets (1,000 mg total) by mouth every 8 (eight) hours.  . cholecalciferol (VITAMIN D) 1000 UNITS tablet Take 1,000 Units by mouth daily.   . clotrimazole-betamethasone (LOTRISONE) cream Apply 1 application topically 2 (two) times daily.  [name] Co-Enzyme Q10 200 MG CAPS Take 1 capsule by mouth daily.  . Flaxseed, Linseed, (FLAX SEED OIL) 1000 MG CAPS Take 1 capsule by mouth daily.   . hydrochlorothiazide (MICROZIDE) 12.5 MG capsule Take 1 capsule (12.5 mg total) by mouth 2 (two) times daily.  Vanessa Kick Ethyl (VASCEPA) 1 g CAPS Take 1 tablet by mouth 2 (two) times daily.  . metoprolol (LOPRESSOR) 50 MG tablet TAKE (1) TABLET TWICE A DAY.  . nitroGLYCERIN (NITROSTAT) 0.4 MG SL tablet Place 1 tablet (0.4 mg total) under the tongue every 5 (five) minutes as needed for chest pain.  [name] OVER THE COUNTER MEDICATION Tumeric, MVI and B12  . [  DISCONTINUED] metoprolol tartrate (LOPRESSOR) 50 MG tablet TAKE (1) TABLET TWICE A DAY.   Facility-Administered Encounter Medications as of 08/19/2017  Medication  . DOBUTamine (DOBUTREX) 1,000 mcg/mL in dextrose 5% 250 mL infusion      Review of Systems  Constitutional: Negative.   HENT: Negative.   Eyes: Negative.   Respiratory: Negative.   Cardiovascular: Negative.   Gastrointestinal: Negative.   Endocrine: Negative.   Genitourinary: Negative.   Musculoskeletal: Negative.   Skin: Negative.   Allergic/Immunologic: Negative.    Neurological: Negative.   Hematological: Negative.   Psychiatric/Behavioral: Negative.        Objective:   Physical Exam  Constitutional: She is oriented to person, place, and time. She appears well-developed and well-nourished. No distress.  The patient is pleasant smiling and relaxed.  This is despite the recent loss of a daughter-in-law at 77 years of age of pancreatic cancer.  HENT:  Right Ear: External ear normal.  Left Ear: External ear normal.  Nose: Nose normal.  Mouth/Throat: Oropharynx is clear and moist. No oropharyngeal exudate.  Eyes: Pupils are equal, round, and reactive to light. Conjunctivae and EOM are normal. Right eye exhibits no discharge. Left eye exhibits no discharge.  Eye exam is planned soon.  Neck: Normal range of motion. Neck supple. No thyromegaly present.  No bruits thyromegaly or anterior cervical adenopathy  Cardiovascular: Normal rate and normal heart sounds.  No murmur heard. The heart is slightly irregular at 84/min.  Distal pulses bilaterally were difficult to palpate.  She denies any claudication symptoms.  Pulmonary/Chest: Effort normal and breath sounds normal. She has no wheezes. She has no rales.  Clear anteriorly and posteriorly  Abdominal: Soft. Bowel sounds are normal. She exhibits no mass. There is no tenderness.  Mild abdominal obesity without masses tenderness organ enlargement bruits or hernias being palpated.  Musculoskeletal: Normal range of motion. She exhibits no edema.  Status post left knee replacement with good flexion and extension of both lower extremity  Lymphadenopathy:    She has no cervical adenopathy.  Neurological: She is alert and oriented to person, place, and time. She has normal reflexes. No cranial nerve deficit.  Skin: Skin is warm and dry. No rash noted. No erythema.  Dry skin in general  Psychiatric: She has a normal mood and affect. Her behavior is normal. Judgment and thought content normal.  Patient's mood  behavior and affect appeared normal.  Nursing note and vitals reviewed.  BP 124/63 (BP Location: Left Arm)   Pulse 82   Temp (!) 97.2 F (36.2 C) (Oral)   Ht '5\' 4"'  (1.626 m)   Wt 181 lb (82.1 kg)   BMI 31.07 kg/m         Assessment & Plan:  1. Pure hypercholesterolemia -Meet with nurse to discuss Repatha initiation after finding out costs for patient. - CBC with Differential/Platelet - Lipid panel  2. ASCVD (arteriosclerotic cardiovascular disease) -This patient does have a history of coronary artery disease.  She has been intolerant to most statins.  We will look again into initiating Repatha, a PCSK9 inhibitor for this patient.  There is a strong family history of heart disease.  She does see the cardiologist regularly. - CBC with Differential/Platelet - Lipid panel  3. Hypertension -Blood pressure is good today and she will continue with current treatment - BMP8+EGFR - CBC with Differential/Platelet - Hepatic function panel  4. Vitamin D deficiency -Continue with current treatment pending results of lab work - CBC with Differential/Platelet -  VITAMIN D 25 Hydroxy (Vit-D Deficiency, Fractures) - DG [facility] DEXA; Future  5. BMI 31.0-31.9,adult -We will continue to work aggressively on getting her weight down through diet and exercise. - CBC with Differential/Platelet  6. AAA (abdominal aortic aneurysm) without rupture ([place]) -We will make sure that she continues to get follow-up of this through the cardiologist. - CBC with Differential/Platelet - Lipid panel  7. Atherosclerosis of native coronary artery of native heart without angina pectoris -Try to initiate Repatha treatment again based on her history and more lenient circumstances of initiating this. - CBC with Differential/Platelet - Lipid panel  8. Statin intolerance -Continue aggressive therapeutic lifestyle changes and start Repatha - CBC with Differential/Platelet  9. Hyperlipidemia LDL goal  <70 -Initiate Repatha - CBC with Differential/Platelet  10. Malignant neoplasm of urinary bladder, unspecified site [facility]) -Follow-up with urologist every 6 months, Dr. Karsten Ro - CBC with Differential/Platelet  11. Coronary artery disease involving native heart without angina pectoris, unspecified vessel or lesion type -Continue to follow-up with cardiology - CBC with Differential/Platelet  12. Healthcare maintenance - Thyroid Panel With TSH  13.  Peripheral vascular insufficiency -Walk regularly and try to initiate better cholesterol treatment with Repatha and report to me any symptoms of claudication if they start occurring  No orders of the defined types were placed in this encounter.  Patient Instructions                       Medicare Annual Wellness Visit  [place] and the medical providers at [facility] strive to bring you the best medical care.  In doing so we not only want to address your current medical conditions and concerns but also to detect new conditions early and prevent illness, disease and health-related problems.    Medicare offers a yearly Wellness Visit which allows our clinical staff to assess your need for preventative services including immunizations, lifestyle education, counseling to decrease risk of preventable diseases and screening for fall risk and other medical concerns.    This visit is provided free of charge (no copay) for all Medicare recipients. The clinical pharmacists at [facility] have begun to conduct these Wellness Visits which will also include a thorough review of all your medications.    As you primary medical provider recommend that you make an appointment for your Annual Wellness Visit if you have not done so already this year.  You may set up this appointment before you leave today or you may call back ([phone]) and schedule an appointment.  Please make sure when you call that you mention  that you are scheduling your Annual Wellness Visit with the clinical pharmacist so that the appointment may be made for the proper length of time.     Continue current medications. Continue good therapeutic lifestyle changes which include good diet and exercise. Fall precautions discussed with patient. If an FOBT was given today- please return it to our front desk. If you are over 4 years old - you may need Prevnar 47 or the adult Pneumonia vaccine.  **Flu shots are available--- please call and schedule a FLU-CLINIC appointment**  After your visit with [place] today you will receive a survey in the mail or online from [employer] regarding your care with [place]. Please take a moment to fill this out. Your feedback is very important to [place] as you can help [place] better understand your patient needs as well as improve your experience and satisfaction.  WE CARE ABOUT YOU!!!   Continue to follow-up with cardiology Continue to follow-up with urology We will call with results of DEXA scan as soon as those results become available Continue to walk regularly and stay active physically and follow diet closely in order to lose weight and improve circulation to your lower extremities   Arrie Senate MD

## 2017-08-20 LAB — CBC WITH DIFFERENTIAL/PLATELET
Basophils Absolute: 0 10*3/uL (ref 0.0–0.2)
Basos: 1 %
EOS (ABSOLUTE): 0.2 10*3/uL (ref 0.0–0.4)
Eos: 3 %
Hematocrit: 38.6 % (ref 34.0–46.6)
Hemoglobin: 13.3 g/dL (ref 11.1–15.9)
Immature Grans (Abs): 0 10*3/uL (ref 0.0–0.1)
Immature Granulocytes: 0 %
Lymphocytes Absolute: 1.9 10*3/uL (ref 0.7–3.1)
Lymphs: 36 %
MCH: 32.1 pg (ref 26.6–33.0)
MCHC: 34.5 g/dL (ref 31.5–35.7)
MCV: 93 fL (ref 79–97)
Monocytes Absolute: 0.5 10*3/uL (ref 0.1–0.9)
Monocytes: 9 %
Neutrophils Absolute: 2.8 10*3/uL (ref 1.4–7.0)
Neutrophils: 51 %
Platelets: 253 10*3/uL (ref 150–450)
RBC: 4.14 x10E6/uL (ref 3.77–5.28)
RDW: 14 % (ref 12.3–15.4)
WBC: 5.4 10*3/uL (ref 3.4–10.8)

## 2017-08-20 LAB — LIPID PANEL
Chol/HDL Ratio: 5.9 ratio — ABNORMAL HIGH (ref 0.0–4.4)
Cholesterol, Total: 315 mg/dL — ABNORMAL HIGH (ref 100–199)
HDL: 53 mg/dL (ref >39)
LDL Calculated: 217 mg/dL — ABNORMAL HIGH (ref 0–99)
Triglycerides: 225 mg/dL — ABNORMAL HIGH (ref 0–149)
VLDL Cholesterol Cal: 45 mg/dL — ABNORMAL HIGH (ref 5–40)

## 2017-08-20 LAB — THYROID PANEL WITH TSH
Free Thyroxine Index: 1.4 (ref 1.2–4.9)
T3 Uptake Ratio: 26 % (ref 24–39)
T4, Total: 5.4 ug/dL (ref 4.5–12.0)
TSH: 3.12 u[IU]/mL (ref 0.450–4.500)

## 2017-08-20 LAB — HEPATIC FUNCTION PANEL
ALT: 18 IU/L (ref 0–32)
AST: 17 IU/L (ref 0–40)
Albumin: 4.4 g/dL (ref 3.5–4.8)
Alkaline Phosphatase: 69 IU/L (ref 39–117)
Bilirubin Total: 0.4 mg/dL (ref 0.0–1.2)
Bilirubin, Direct: 0.1 mg/dL (ref 0.00–0.40)
Total Protein: 7.1 g/dL (ref 6.0–8.5)

## 2017-08-20 LAB — BMP8+EGFR
BUN/Creatinine Ratio: 17 (ref 12–28)
BUN: 14 mg/dL (ref 8–27)
CO2: 24 mmol/L (ref 20–29)
Calcium: 9.6 mg/dL (ref 8.7–10.3)
Chloride: 99 mmol/L (ref 96–106)
Creatinine, Ser: 0.81 mg/dL (ref 0.57–1.00)
GFR calc Af Amer: 80 mL/min/{1.73_m2} (ref >59)
GFR calc non Af Amer: 70 mL/min/{1.73_m2} (ref >59)
Glucose: 104 mg/dL — ABNORMAL HIGH (ref 65–99)
Potassium: 4.1 mmol/L (ref 3.5–5.2)
Sodium: 140 mmol/L (ref 134–144)

## 2017-08-20 LAB — VITAMIN D 25 HYDROXY (VIT D DEFICIENCY, FRACTURES): Vit D, 25-Hydroxy: 42.2 ng/mL (ref 30.0–100.0)

## 2017-08-24 DIAGNOSIS — H25013 Cortical age-related cataract, bilateral: Secondary | ICD-10-CM | POA: Diagnosis not present

## 2017-08-24 DIAGNOSIS — H2513 Age-related nuclear cataract, bilateral: Secondary | ICD-10-CM | POA: Diagnosis not present

## 2017-08-24 DIAGNOSIS — H5203 Hypermetropia, bilateral: Secondary | ICD-10-CM | POA: Diagnosis not present

## 2017-09-03 DIAGNOSIS — L820 Inflamed seborrheic keratosis: Secondary | ICD-10-CM | POA: Diagnosis not present

## 2017-09-03 DIAGNOSIS — Z1283 Encounter for screening for malignant neoplasm of skin: Secondary | ICD-10-CM | POA: Diagnosis not present

## 2017-09-03 DIAGNOSIS — Z85828 Personal history of other malignant neoplasm of skin: Secondary | ICD-10-CM | POA: Diagnosis not present

## 2017-09-03 DIAGNOSIS — Z08 Encounter for follow-up examination after completed treatment for malignant neoplasm: Secondary | ICD-10-CM | POA: Diagnosis not present

## 2017-09-25 ENCOUNTER — Other Ambulatory Visit: Payer: Self-pay | Admitting: Family Medicine

## 2017-10-14 ENCOUNTER — Other Ambulatory Visit: Payer: Self-pay | Admitting: *Deleted

## 2017-10-14 ENCOUNTER — Ambulatory Visit (INDEPENDENT_AMBULATORY_CARE_PROVIDER_SITE_OTHER): Payer: Medicare Other | Admitting: Family Medicine

## 2017-10-14 ENCOUNTER — Encounter: Payer: Self-pay | Admitting: Family Medicine

## 2017-10-14 VITALS — BP 151/82 | HR 89 | Temp 98.8°F | Ht 64.0 in | Wt 184.0 lb

## 2017-10-14 DIAGNOSIS — I739 Peripheral vascular disease, unspecified: Secondary | ICD-10-CM | POA: Diagnosis not present

## 2017-10-14 MED ORDER — EVOLOCUMAB 140 MG/ML ~~LOC~~ SOAJ: 140.0000 mg | SUBCUTANEOUS | 1 refills | Status: DC

## 2017-10-14 NOTE — Progress Notes (Signed)
Subjective: CC: circulation problems PCP: Chipper Herb, MD UXL:KGMW Gabriela Smith is a 77 y.o. female presenting to clinic today for:  1. Circulation Patient reports a long-standing history of right lower extremity circulation problems.  She notes that about a month ago she had a skin cancer removed and she has had strange veins and discoloration to the anterior leg since that time.  She reports calf and foot pain with walking.  She cites a recent episode where she walked around her block and had a lot of cramping in her right leg.  She denies any gross discoloration of the foot.  She does note occasional numbness and tingling of bilateral feet.  Past medical history significant for coronary artery disease, abdominal aortic aneurysm without rupture and aortic atherosclerosis.  She has been intolerant to statins and therefore was put on Repatha.  She has not started this medication yet.  She is not on an aspirin.   ROS: Per HPI  Allergies  Allergen Reactions  . Fish Allergy Hives    Bottom Fish   . Lasix [Furosemide] Other (See Comments)    Edema, elevated BP  . Macrodantin [Nitrofurantoin Macrocrystal] Other (See Comments)    Unknown reaction   . Vicodin [Hydrocodone-Acetaminophen] Other (See Comments)    SEVERE N/V  . Zetia [Ezetimibe] Other (See Comments)  . Codeine Rash  . Latex Itching and Rash    Reports Elastic in underwear, under wire bras, and now surgical cap cause rash and itching.   . Niaspan [Niacin Er] Other (See Comments)    Muscle aches  . Statins Other (See Comments)    Muscle aches/ cramps  . Sulfa Antibiotics Rash   Past Medical History:  Diagnosis Date  . AAA (abdominal aortic aneurysm) [facility])    cardiology aware   . Arthritis    OA lt knee- cortizone inj. q 4 months AND OA ALSO IN BACK. Had knee replaced-issue resolved  . CAD (coronary artery disease) CARDIOLOGIST- [facility]--  VISIT 01-02-10 IN EPIC   1993-- PTCA. Pt describes a near total blockage of  apparently the LAD and a 65% stenosis elsewhere.  . Cancer ([place])   . History of bladder cancer followed by dr Karsten Ro   hx  TCC of bladder ,  Ta G3-  first occurence 02-20-2012--  s/p BCG tx's  and  mitomycin C  . Hyperlipidemia 1993  . Hypertension 1993  . Nocturia   . Osteopenia   . Sigmoid diverticulosis     Current Outpatient Medications:  .  acetaminophen (TYLENOL) 500 MG tablet, Take 2 tablets (1,000 mg total) by mouth every 8 (eight) hours., Disp: 30 tablet, Rfl: 0 .  cholecalciferol (VITAMIN D) 1000 UNITS tablet, Take 1,000 Units by mouth daily. , Disp: , Rfl:  .  clotrimazole-betamethasone (LOTRISONE) cream, Apply 1 application topically 2 (two) times daily., Disp: 90 g, Rfl: 6 .  Co-Enzyme Q10 200 MG CAPS, Take 1 capsule by mouth daily., Disp: , Rfl:  .  Flaxseed, Linseed, (FLAX SEED OIL) 1000 MG CAPS, Take 1 capsule by mouth daily. , Disp: , Rfl:  .  hydrochlorothiazide (MICROZIDE) 12.5 MG capsule, Take 1 capsule (12.5 mg total) by mouth 2 (two) times daily., Disp: 180 capsule, Rfl: 3 .  Icosapent Ethyl (VASCEPA) 1 g CAPS, Take 1 tablet by mouth 2 (two) times daily., Disp: 60 capsule, Rfl: 4 .  metoprolol tartrate (LOPRESSOR) 50 MG tablet, TAKE (1) TABLET TWICE A DAY., Disp: 180 tablet, Rfl: 1 .  nitroGLYCERIN (  NITROSTAT) 0.4 MG SL tablet, Place 1 tablet (0.4 mg total) under the tongue every 5 (five) minutes as needed for chest pain., Disp: 50 tablet, Rfl: 0 .  OVER THE COUNTER MEDICATION, Tumeric, MVI and B12, Disp: , Rfl:  .  traMADol (ULTRAM) 50 MG tablet, Take 1 tablet (50 mg total) by mouth daily as needed., Disp: 30 tablet, Rfl: 0 No current facility-administered medications for this visit.   Facility-Administered Medications Ordered in Other Visits:  .  DOBUTamine (DOBUTREX) 1,000 mcg/mL in dextrose 5% 250 mL infusion, 20 mcg/kg/min, Intravenous, Continuous, Dorothy Spark, MD, Last Rate: 96.4 mL/hr at 12/24/15 0840, 20 mcg/kg/min at 12/24/15 0840 Social History    Socioeconomic History  . Marital status: Married    Spouse name: Not on file  . Number of children: 3  . Years of education: Not on file  . Highest education level: Not on file  Occupational History  . Not on file  Social Needs  . Financial resource strain: Not on file  . Food insecurity:    Worry: Not on file    Inability: Not on file  . Transportation needs:    Medical: Not on file    Non-medical: Not on file  Tobacco Use  . Smoking status: Former Smoker    Packs/day: 0.50    Years: 5.00    Pack years: 2.50    Types: Cigarettes    Last attempt to quit: 03/09/1976    Years since quitting: 41.6  . Smokeless tobacco: Never Used  Substance and Sexual Activity  . Alcohol use: Yes    Alcohol/week: 2.4 oz    Types: 4 Glasses of wine per week    Comment: Occasionally  . Drug use: No  . Sexual activity: Never  Lifestyle  . Physical activity:    Days per week: Not on file    Minutes per session: Not on file  . Stress: Not on file  Relationships  . Social connections:    Talks on phone: Not on file    Gets together: Not on file    Attends religious service: Not on file    Active member of club or organization: Not on file    Attends meetings of clubs or organizations: Not on file    Relationship status: Not on file  . Intimate partner violence:    Fear of current or ex partner: Not on file    Emotionally abused: Not on file    Physically abused: Not on file    Forced sexual activity: Not on file  Other Topics Concern  . Not on file  Social History Narrative   Married with 3 children and 4 grandchildren.  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 with Parkinsons   Family History  Problem Relation Age of Onset  . Coronary artery disease Father 39  . Hyperlipidemia Father   . Heart attack Mother 35  . Hyperlipidemia Mother   . Osteoporosis Sister   . Hyperlipidemia Sister   . Heart attack Daughter 50       CABG x 5  . Heart disease Son 45       Stents x 3     Objective: Office vital signs reviewed. BP (!) 151/82   Pulse 89   Temp 98.8 F (37.1 C) (Oral)   Ht 5\' 4"  (1.626 m)   Wt 184 lb (83.5 kg)   BMI 31.58 kg/m   Physical Examination:  General: Awake, alert, well nourished, No acute distress Extremities: warm, No edema,  cyanosis or clubbing; +1 pulses bilaterally MSK: normal gait and normal station Skin: She has a small area of ecchymosis along the anterior aspect of the right lower extremity.  There is associated varicose veins.  Lower extremities are shiny and hairless.  Assessment/ Plan: 77 y.o. female   1. Claudication of right lower extremity (HCC) Clinically, her physical exam is suspicious for peripheral artery disease.  She certainly has enough risk factors.  Will obtain ABIs.  We discussed continuing physical activity as tolerated.  Could consider adding a daily low-dose aspirin.  She will start [facility].  We will plan for referral to vascular pending ultrasound results. - US ARTERIAL ABI (SCREENING LOWER EXTREMITY); Future   Orders Placed This Encounter  Procedures  . US ARTERIAL ABI (SCREENING LOWER EXTREMITY)    Standing Status:   Future    Standing Expiration Date:   12/15/2018    Order Specific Question:   Reason for Exam (SYMPTOM  OR DIAGNOSIS REQUIRED)    Answer:   claudication symptoms in RLE.  history of CAD.    Order Specific Question:   Preferred imaging location?    Answer:   [place], [facility] [phone]

## 2017-10-14 NOTE — Progress Notes (Signed)
Per Dr Drucie Opitz for Echo sent to [facility]

## 2017-10-14 NOTE — Patient Instructions (Signed)
Intermittent Claudication Intermittent claudication is pain in your leg that occurs when you walk or exercise and goes away when you rest. The pain can occur in one or both legs. What are the causes? Intermittent claudication is caused by the buildup of plaque within the major arteries in the body (atherosclerosis). The plaque, which makes arteries stiff and narrow, prevents enough blood from reaching your leg muscles. The pain occurs when you walk or exercise because your muscles need more blood when you are moving and exercising. What increases the risk? Risk factors include:  A family history of atherosclerosis.  A personal history of stroke or heart disease.  Older age.  Being inactive or overweight.  Smoking cigarettes.  Having another health condition such as: ? Diabetes. ? High blood pressure. ? High cholesterol.  What are the signs or symptoms? Your hip or leg may:  Ache.  Cramp.  Feel tight.  Feel weak.  Feel heavy.  Over time, you may feel pain in your calf, thigh, or hip. How is this diagnosed? Your health care provider may diagnose intermittent claudication based on your symptoms and medical history. Your health care provider may also do tests to learn more about your condition. These may include:  Blood tests.  An ultrasound.  Imaging tests such as angiography, magnetic resonance angiography (MRA), and computed tomography angiography (CTA).  How is this treated? You may be treated for problems such as:  High blood pressure.  High cholesterol.  Diabetes.  Other treatments may include:  Lifestyle changes such as: ? Starting an exercise program. ? Losing weight. ? Quitting smoking.  Medicines to help restore blood flow through your legs.  Blood vessel surgery (angioplasty) to restore blood flow if your intermittent claudication is caused by severe peripheral artery disease.  Follow these instructions at home:  Manage any other health  conditions you have.  Eat a diet low in saturated fats and calories to maintain a healthy weight.  Quit smoking, if you smoke.  Take medicines only as directed by your health care provider.  If your health care provider recommended an exercise program for you, follow it as directed. Your exercise program may involve: ? Walking three or more times a week. ? Walking until you have certain symptoms of intermittent claudication. ? Resting until symptoms go away. ? Gradually increasing walking time to about 50 minutes a day. Contact a health care provider if: Your condition is not getting better or is getting worse. Get help right away if:  You have chest pain.  You have difficulty breathing.  You develop arm weakness.  You have trouble speaking.  Your face begins to droop. This information is not intended to replace advice given to you by your health care provider. Make sure you discuss any questions you have with your health care provider. Document Released: 12/28/2003 Document Revised: 08/02/2015 Document Reviewed: 06/02/2013 Elsevier Interactive Patient Education  2017 Elsevier Inc.  

## 2017-10-20 ENCOUNTER — Other Ambulatory Visit: Payer: Self-pay

## 2017-10-20 ENCOUNTER — Ambulatory Visit (HOSPITAL_COMMUNITY)
Admission: RE | Admit: 2017-10-20 | Discharge: 2017-10-20 | Disposition: A | Discharge status: Home / Self Care | Payer: Medicare Other | Source: Ambulatory Visit | Attending: Family Medicine | Admitting: Family Medicine

## 2017-10-20 ENCOUNTER — Telehealth: Payer: Self-pay | Admitting: Cardiology

## 2017-10-20 DIAGNOSIS — I739 Peripheral vascular disease, unspecified: Secondary | ICD-10-CM | POA: Insufficient documentation

## 2017-10-20 NOTE — Progress Notes (Signed)
Referral placed per provider 

## 2017-10-20 NOTE — Telephone Encounter (Signed)
Gabriela Smith from [facility] office is trying to get Repatha for pt . Per [facility],pt  may qualify for assistance from manufacture go to [email or web address] .[email or web address] to get a application otherwise after prior auth pt is going to be out of pocket 479.00 for 3 months .Gabriela Smith notified of above

## 2017-11-02 ENCOUNTER — Ambulatory Visit (INDEPENDENT_AMBULATORY_CARE_PROVIDER_SITE_OTHER): Payer: Medicare Other | Admitting: Vascular Surgery

## 2017-11-02 ENCOUNTER — Encounter: Payer: Self-pay | Admitting: Vascular Surgery

## 2017-11-02 VITALS — BP 185/93 | HR 87 | Temp 96.9°F | Resp 18 | Wt 183.0 lb

## 2017-11-02 DIAGNOSIS — I739 Peripheral vascular disease, unspecified: Secondary | ICD-10-CM | POA: Diagnosis not present

## 2017-11-02 NOTE — Progress Notes (Signed)
[facility]ist of [place]  Patient name: Gabriela Smith MRN: [id number] DOB: [birth year] Sex: female  REASON FOR CONSULT: Evaluation of lower extremity arterial flow  Seen today in our [place] office  HPI: Gabriela Smith is a 77 y.o. female, who is seen today for discussion of recent noninvasive studies at [facility] on 10/20/2017.  Very pleasant healthy female who had an episode of pain in her distal anterior right calf.  This was only present for 1 day and completely resolved.  She has no prior history of claudication.  No history of tissue loss.  She did have a skin cancer removed years ago at the same area.  She had noted in the past some diminished pedal pulses and underwent noninvasive studies at [facility] on 10/20/2017.  This revealed normal ankle arm index on the left and slightly diminished ankle arm index on the right of 0.8 the artery angioplasty and 1993.  No difficulty since that time.  She is not a cigarette smoker currently.  Has care for her husband who has Parkinson's disease.  They have been married for 60 years.  Past Medical History:  Diagnosis Date  . AAA (abdominal aortic aneurysm) [facility])    cardiology aware   . Arthritis    OA lt knee- cortizone inj. q 4 months AND OA ALSO IN BACK. Had knee replaced-issue resolved  . CAD (coronary artery disease) CARDIOLOGIST- DR [facility]--  VISIT 01-02-10 IN [facility]IC   1993-- PTCA. Pt describes a near total blockage of apparently the LAD and a 65% stenosis elsewhere.  . Cancer ([place])   . History of bladder cancer followed by dr Karsten Ro   hx  TCC of bladder ,  Ta G3-  first occurence 02-20-2012--  s/p BCG tx's  and  mitomycin C  . Hyperlipidemia 1993  . Hypertension 1993  . Nocturia   . Osteopenia   . Sigmoid diverticulosis     Family History  Problem Relation Age of Onset  . Coronary artery disease Father 44  . Hyperlipidemia Father   . Heart attack Mother 29  .  Hyperlipidemia Mother   . Osteoporosis Sister   . Hyperlipidemia Sister   . Heart attack Daughter 8       CABG x 5  . Heart disease Son 108       Stents x 3    SOCIAL HISTORY: Social History   Socioeconomic History  . Marital status: Married    Spouse name: Not on file  . Number of children: 3  . Years of education: Not on file  . Highest education level: Not on file  Occupational History  . Not on file  Social Needs  . Financial resource strain: Not on file  . Food insecurity:    Worry: Not on file    Inability: Not on file  . Transportation needs:    Medical: Not on file    Non-medical: Not on file  Tobacco Use  . Smoking status: Former Smoker    Packs/day: 0.50    Years: 5.00    Pack years: 2.50    Types: Cigarettes    Last attempt to quit: 03/09/1976    Years since quitting: 41.6  . Smokeless tobacco: Never Used  Substance and Sexual Activity  . Alcohol use: Yes    Alcohol/week: 4.0 standard drinks    Types: 4 Glasses of wine per week    Comment: Occasionally  . Drug use: No  .  Sexual activity: Never  Lifestyle  . Physical activity:    Days per week: Not on file    Minutes per session: Not on file  . Stress: Not on file  Relationships  . Social connections:    Talks on phone: Not on file    Gets together: Not on file    Attends religious service: Not on file    Active member of club or organization: Not on file    Attends meetings of clubs or organizations: Not on file    Relationship status: Not on file  . Intimate partner violence:    Fear of current or ex partner: Not on file    Emotionally abused: Not on file    Physically abused: Not on file    Forced sexual activity: Not on file  Other Topics Concern  . Not on file  Social History Narrative   Married with 3 children and 4 grandchildren.  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 with Parkinsons    Allergies  Allergen Reactions  . Fish Allergy Hives    Bottom Fish   . Lasix [Furosemide] Other (See  Comments)    Edema, elevated BP  . Macrodantin [Nitrofurantoin Macrocrystal] Other (See Comments)    Unknown reaction   . Vicodin [Hydrocodone-Acetaminophen] Other (See Comments)    SEVERE N/V  . Zetia [Ezetimibe] Other (See Comments)  . Codeine Rash  . Latex Itching and Rash    Reports Elastic in underwear, under wire bras, and now surgical cap cause rash and itching.   . Niaspan [Niacin Er] Other (See Comments)    Muscle aches  . Statins Other (See Comments)    Muscle aches/ cramps  . Sulfa Antibiotics Rash    Current Outpatient Medications  Medication Sig Dispense Refill  . acetaminophen (TYLENOL) 500 MG tablet Take 2 tablets (1,000 mg total) by mouth every 8 (eight) hours. 30 tablet 0  . cholecalciferol (VITAMIN D) 1000 UNITS tablet Take 1,000 Units by mouth daily.     . clotrimazole-betamethasone (LOTRISONE) cream Apply 1 application topically 2 (two) times daily. 90 g 6  . Co-Enzyme Q10 200 MG CAPS Take 1 capsule by mouth daily.    . Evolocumab (REPATHA SURECLICK) 846 MG/ML SOAJ Inject 140 mg into the skin every 14 (fourteen) days. 6 pen 1  . Flaxseed, Linseed, (FLAX SEED OIL) 1000 MG CAPS Take 1 capsule by mouth daily.     . hydrochlorothiazide (MICROZIDE) 12.5 MG capsule Take 1 capsule (12.5 mg total) by mouth 2 (two) times daily. 180 capsule 3  . metoprolol tartrate (LOPRESSOR) 50 MG tablet TAKE (1) TABLET TWICE A DAY. 180 tablet 1  . nitroGLYCERIN (NITROSTAT) 0.4 MG SL tablet Place 1 tablet (0.4 mg total) under the tongue every 5 (five) minutes as needed for chest pain. 50 tablet 0  . OVER THE COUNTER MEDICATION Tumeric, MVI and B12    . traMADol (ULTRAM) 50 MG tablet Take 1 tablet (50 mg total) by mouth daily as needed. 30 tablet 0   No current facility-administered medications for this visit.    Facility-Administered Medications Ordered in Other Visits  Medication Dose Route Frequency Provider Last Rate Last Dose  . DOBUTamine (DOBUTREX) 1,000 mcg/mL in dextrose 5% 250  mL infusion  20 mcg/kg/min Intravenous Continuous Dorothy Spark, MD 96.4 mL/hr at 12/24/15 0840 20 mcg/kg/min at 12/24/15 0840    REVIEW OF SYSTEMS:  [X]  denotes positive finding, [ ]  denotes negative finding Cardiac  Comments:  Chest pain or chest pressure:  Shortness of breath upon exertion:    Short of breath when lying flat:    Irregular heart rhythm:        Vascular    Pain in calf, thigh, or hip brought on by ambulation:    Pain in feet at night that wakes you up from your sleep:     Blood clot in your veins:    Leg swelling:         Pulmonary    Oxygen at home:    Productive cough:     Wheezing:         Neurologic    Sudden weakness in arms or legs:     Sudden numbness in arms or legs:     Sudden onset of difficulty speaking or slurred speech:    Temporary loss of vision in one eye:     Problems with dizziness:         Gastrointestinal    Blood in stool:     Vomited blood:         Genitourinary    Burning when urinating:     Blood in urine:        Psychiatric    Major depression:         Hematologic    Bleeding problems:    Problems with blood clotting too easily:        Skin    Rashes or ulcers:        Constitutional    Fever or chills:      PHYSICAL EXAM: Vitals:   11/02/17 0949 11/02/17 0951  BP: (!) 178/108 (!) 185/93  Pulse: 88 87  Resp: 18   Temp: (!) 96.9 F (36.1 C)   TempSrc: Temporal   Weight: 183 lb (83 kg)     GENERAL: The patient is a well-nourished female, in no acute distress. The vital signs are documented above. CARDIOVASCULAR: Carotid arteries without bruits bilaterally.  2+ radial pulses.  Femoral pulses.  I do not palpate popliteal or pedal pulses bilaterally. PULMONARY: There is good air exchange  ABDOMEN: Soft and non-tender  MUSCULOSKELETAL: There are no major deformities or cyanosis. NEUROLOGIC: No focal weakness or paresthesias are detected. SKIN: There are no ulcers or rashes noted. PSYCHIATRIC: The patient  has a normal affect.  DATA:  Noninvasive vascular studies from 10/20/2017 reveal ankle arm index normal on the left at 1.0 and slightly diminished at 0.8 on the right  MEDICAL ISSUES: I discussed these findings at length with the patient.  Does not have any evidence of arterial insufficiency causing any discomfort.  I do not feel this is related for the event with discomfort for 1 day and her distal ankle.  She does walk and has no claudication.  I am not concerned that she does not have palpable pedal pulses with minimal arterial insufficiency bilaterally.  Is reassured with this discussion will see [place] again on an as-needed basis   Rosetta Posner, MD [facility] Tel [phone] Pager [phone]

## 2017-11-10 ENCOUNTER — Telehealth: Payer: Self-pay | Admitting: Family Medicine

## 2017-11-10 NOTE — Telephone Encounter (Signed)
Please have Zigmund Daniel call Gabriela Smith the side effects and follow his recommendations.  I would think probably she should not take any more injections but was talked to the company rep first

## 2017-11-10 NOTE — Telephone Encounter (Signed)
Took 1 repatha injection so far - due for another on Mon.  Her symptoms include: Backache Legs are very weak, she feels like she is dragging. She is allergic to latex - not sure if it has latex in syringe.

## 2017-11-11 DIAGNOSIS — N760 Acute vaginitis: Secondary | ICD-10-CM | POA: Diagnosis not present

## 2017-11-11 NOTE — Telephone Encounter (Signed)
Pt aware to stop medication

## 2017-11-19 DIAGNOSIS — L570 Actinic keratosis: Secondary | ICD-10-CM | POA: Diagnosis not present

## 2017-11-19 DIAGNOSIS — I872 Venous insufficiency (chronic) (peripheral): Secondary | ICD-10-CM | POA: Diagnosis not present

## 2017-11-19 DIAGNOSIS — X32XXXD Exposure to sunlight, subsequent encounter: Secondary | ICD-10-CM | POA: Diagnosis not present

## 2017-11-25 DIAGNOSIS — N762 Acute vulvitis: Secondary | ICD-10-CM | POA: Diagnosis not present

## 2017-11-25 DIAGNOSIS — N760 Acute vaginitis: Secondary | ICD-10-CM | POA: Diagnosis not present

## 2017-11-30 ENCOUNTER — Ambulatory Visit (INDEPENDENT_AMBULATORY_CARE_PROVIDER_SITE_OTHER): Payer: Medicare Other | Admitting: *Deleted

## 2017-11-30 VITALS — BP 157/84 | HR 95 | Ht 64.0 in | Wt 183.0 lb

## 2017-11-30 DIAGNOSIS — Z0000 Encounter for general adult medical examination without abnormal findings: Secondary | ICD-10-CM

## 2017-11-30 NOTE — Patient Instructions (Addendum)
Please work on your goal of continuing to reduce carbohydrates in your diet.  At your convenience, please bring a copy of your Advance Directives to our office to be filed in your chart.   You are due for your Shingles vaccine (scheduled for 12/21/17) and flu vaccine.   Thank you for coming in for your Annual Wellness Visit today!!   Preventive Care 79 Years and Older, Female Preventive care refers to lifestyle choices and visits with your health care provider that can promote health and wellness. What does preventive care include?  A yearly physical exam. This is also called an annual well check.  Dental exams once or twice a year.  Routine eye exams. Ask your health care provider how often you should have your eyes checked.  Personal lifestyle choices, including: ? Daily care of your teeth and gums. ? Regular physical activity. ? Eating a healthy diet. ? Avoiding tobacco and drug use. ? Limiting alcohol use. ? Practicing safe sex. ? Taking low-dose aspirin every day. ? Taking vitamin and mineral supplements as recommended by your health care provider. What happens during an annual well check? The services and screenings done by your health care provider during your annual well check will depend on your age, overall health, lifestyle risk factors, and family history of disease. Counseling Your health care provider may ask you questions about your:  Alcohol use.  Tobacco use.  Drug use.  Emotional well-being.  Home and relationship well-being.  Sexual activity.  Eating habits.  History of falls.  Memory and ability to understand (cognition).  Work and work Statistician.  Reproductive health.  Screening You may have the following tests or measurements:  Height, weight, and BMI.  Blood pressure.  Lipid and cholesterol levels. These may be checked every 5 years, or more frequently if you are over 74 years old.  Skin check.  Lung cancer screening. You may  have this screening every year starting at age 56 if you have a 30-pack-year history of smoking and currently smoke or have quit within the past 15 years.  Fecal occult blood test (FOBT) of the stool. You may have this test every year starting at age 79.  Flexible sigmoidoscopy or colonoscopy. You may have a sigmoidoscopy every 5 years or a colonoscopy every 10 years starting at age 79.  Hepatitis C blood test.  Hepatitis B blood test.  Sexually transmitted disease (STD) testing.  Diabetes screening. This is done by checking your blood sugar (glucose) after you have not eaten for a while (fasting). You may have this done every 1-3 years.  Bone density scan. This is done to screen for osteoporosis. You may have this done starting at age 79.  Mammogram. This may be done every 1-2 years. Talk to your health care provider about how often you should have regular mammograms.  Talk with your health care provider about your test results, treatment options, and if necessary, the need for more tests. Vaccines Your health care provider may recommend certain vaccines, such as:  Influenza vaccine. This is recommended every year.  Tetanus, diphtheria, and acellular pertussis (Tdap, Td) vaccine. You may need a Td booster every 10 years.  Varicella vaccine. You may need this if you have not been vaccinated.  Zoster vaccine. You may need this after age 79.  Measles, mumps, and rubella (MMR) vaccine. You may need at least one dose of MMR if you were born in 1957 or later. You may also need a second dose.  Pneumococcal 13-valent conjugate (PCV13) vaccine. One dose is recommended after age 79.  Pneumococcal polysaccharide (PPSV23) vaccine. One dose is recommended after age 79.  Meningococcal vaccine. You may need this if you have certain conditions.  Hepatitis A vaccine. You may need this if you have certain conditions or if you travel or work in places where you may be exposed to hepatitis  A.  Hepatitis B vaccine. You may need this if you have certain conditions or if you travel or work in places where you may be exposed to hepatitis B.  Haemophilus influenzae type b (Hib) vaccine. You may need this if you have certain conditions.  Talk to your health care provider about which screenings and vaccines you need and how often you need them. This information is not intended to replace advice given to you by your health care provider. Make sure you discuss any questions you have with your health care provider. Document Released: 03/23/2015 Document Revised: 11/14/2015 Document Reviewed: 12/26/2014 Elsevier Interactive Patient Education  [employer].

## 2017-12-01 ENCOUNTER — Encounter: Payer: Self-pay | Admitting: *Deleted

## 2017-12-01 ENCOUNTER — Other Ambulatory Visit: Payer: Self-pay | Admitting: *Deleted

## 2017-12-01 MED ORDER — TRAMADOL HCL 50 MG PO TABS: 50.0000 mg | ORAL_TABLET | Freq: Every day | ORAL | 0 refills | Status: DC | PRN

## 2017-12-01 NOTE — Progress Notes (Addendum)
Subjective:   Gabriela Smith is a 77 y.o. female who presents for Medicare Annual (Subsequent) preventive examination.  Mrs. Macknight is accompanied today by her husband.  She and her husband live in an apartment over their daughter's garage, and they do not have any pets.  She enjoys traveling, spending time with friends and family, and playing cards.  She has 3 children, 4 grandchildren, and 1 great grandchild.  Mrs. Stambaugh feels her health is unchanged from last year.  She reports no surgeries, ER visits, or hospitalizations in the past year.  Her husband has Parkinson's Disease, and she helps him with bathing and dressing, and also provides transportation for him as he no longer drives.   Review of Systems:  All systems negative today       Objective:     Vitals: BP (!) 157/84   Pulse 95   Ht 5\' 4"  (1.626 m)   Wt 183 lb (83 kg)   BMI 31.41 kg/m   Body mass index is 31.41 kg/m.  Advanced Directives 12/01/2017 12/25/2015 12/19/2015 06/28/2014 03/24/2014 11/22/2012 11/22/2012  Does Patient Have a Medical Advance Directive? Yes Yes Yes Yes Yes Patient has advance directive, copy in chart Patient has advance directive, copy in chart  Type of Advance Directive [place];Living will [place];Living will Living will;Healthcare Power of [place];Living will [place];Living will [place];Living will  Does patient want to make changes to medical advance directive? No - Patient declined No - Patient declined No - Patient declined No - Patient declined No - Patient declined - -  Copy of [place] in Chart? No - copy requested No - copy requested No - copy requested No - copy requested No - copy requested Copy requested from family Copy requested from family  Pre-existing out of facility DNR order (yellow form or pink MOST form) - - - - - No No     Tobacco Social History   Tobacco Use  Smoking Status Former Smoker  . Packs/day: 0.50  . Years: 5.00  . Pack years: 2.50  . Types: Cigarettes  . Last attempt to quit: 03/09/1976  . Years since quitting: 41.7  Smokeless Tobacco Never Used     Counseling given: No   Clinical Intake:     Pain Score: 0-No pain                 Past Medical History:  Diagnosis Date  . AAA (abdominal aortic aneurysm) [facility])    cardiology aware   . Arthritis    OA lt knee- cortizone inj. q 4 months AND OA ALSO IN BACK. Had knee replaced-issue resolved  . CAD (coronary artery disease) CARDIOLOGIST- DR [facility]--  VISIT 01-02-10 IN EPIC   1993-- PTCA. Pt describes a near total blockage of apparently the LAD and a 65% stenosis elsewhere.  . Cancer ([place])   . History of bladder cancer followed by dr Karsten Ro   hx  TCC of bladder ,  Ta G3-  first occurence 02-20-2012--  s/p BCG tx's  and  mitomycin C  . Hyperlipidemia 1993  . Hypertension 1993  . Nocturia   . Osteopenia   . Sigmoid diverticulosis    Past Surgical History:  Procedure Laterality Date  . CHOLECYSTECTOMY  2003 (approx)  . COLONOSCOPY  05-31-2003  . CORONARY ANGIOPLASTY  1993   to LAD  . CYSTOSCOPY WITH BIOPSY  05/09/2011   Procedure: CYSTOSCOPY WITH BIOPSY;  Surgeon: Claybon Jabs, MD;  Location: [facility];  Service: Urology;  Laterality: N/A;  WITH BLADDER BIOPSY GYRUS  . TOTAL HIP ARTHROPLASTY Right 12/25/2015   Procedure: RIGHT TOTAL HIP ARTHROPLASTY ANTERIOR APPROACH;  Surgeon: Paralee Cancel, MD;  Location: [facility];  Service: Orthopedics;  Laterality: Right;  . TOTAL KNEE ARTHROPLASTY Left 11/22/2012   Procedure: LEFT TOTAL KNEE ARTHROPLASTY;  Surgeon: Mauri Pole, MD;  Location: [facility];  Service: Orthopedics;  Laterality: Left;  . TRANSURETHRAL RESECTION OF BLADDER TUMOR  03/14/2011   Procedure: TRANSURETHRAL RESECTION OF BLADDER TUMOR (TURBT);  Surgeon: Claybon Jabs, MD;  [facility];  Service: Urology;  Laterality: N/A;  . TRANSURETHRAL RESECTION OF BLADDER TUMOR  05/09/2011   Procedure: TRANSURETHRAL RESECTION OF BLADDER TUMOR (TURBT);  Surgeon: Claybon Jabs, MD;  Location[facility];  Service: Urology;  Laterality: N/A;  . TRANSURETHRAL RESECTION OF BLADDER TUMOR  03/08/2012   Procedure: TRANSURETHRAL RESECTION OF BLADDER TUMOR (TURBT);  Surgeon: Claybon Jabs, MD;  Location: [facility];  Service: Urology;  Laterality: N/A;  . TRANSURETHRAL RESECTION OF BLADDER TUMOR WITH GYRUS (TURBT-GYRUS) N/A 03/24/2014   Procedure: TRANSURETHRAL RESECTION OF BLADDER TUMOR WITH GYRUS (TURBT-GYRUS);  Surgeon: Claybon Jabs, MD;  Location: [facility];  Service: Urology;  Laterality: N/A;  . UMBILICAL HERNIA REPAIR  2001  (approx)   Family History  Problem Relation Age of Onset  . Coronary artery disease Father 14  . Hyperlipidemia Father   . Heart attack Mother 8  . Hyperlipidemia Mother   . Osteoporosis Sister   . Hyperlipidemia Sister   . Heart attack Daughter 82       CABG x 5  . Heart disease Son 70       Stents x 3   Social History   Socioeconomic History  . Marital status: Married    Spouse name: Not on file  . Number of children: 3  . Years of education: Not on file  . Highest education level: Not on file  Occupational History  . Not on file  Social Needs  . Financial resource strain: Not on file  . Food insecurity:    Worry: Not on file    Inability: Not on file  . Transportation needs:    Medical: Not on file    Non-medical: Not on file  Tobacco Use  . Smoking status: Former Smoker    Packs/day: 0.50    Years: 5.00    Pack years: 2.50    Types: Cigarettes    Last attempt to quit: 03/09/1976    Years since quitting: 41.7  . Smokeless tobacco: Never Used  Substance and Sexual Activity  . Alcohol use: Yes    Alcohol/week: 4.0 standard drinks    Types: 4 Glasses of wine per week     Comment: Occasionally  . Drug use: No  . Sexual activity: Never  Lifestyle  . Physical activity:    Days per week: Not on file    Minutes per session: Not on file  . Stress: Not on file  Relationships  . Social connections:    Talks on phone: Not on file    Gets together: Not on file    Attends religious service: Not on file    Active member of club or organization: Not on file    Attends meetings of clubs or organizations: Not on  file    Relationship status: Not on file  Other Topics Concern  . Not on file  Social History Narrative   Married with 3 children and 4 grandchildren.  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 acetaminophen (TYLENOL) 500 MG tablet Take 2 tablets (1,000 mg total) by mouth every 8 (eight) hours.  . cholecalciferol (VITAMIN D) 1000 UNITS tablet Take 1,000 Units by mouth daily.   . clotrimazole-betamethasone (LOTRISONE) cream Apply 1 application topically 2 (two) times daily. (Patient taking differently: Apply 1 application topically 2 (two) times daily as needed. )  . Co-Enzyme Q10 200 MG CAPS Take 1 capsule by mouth daily.  . Cyanocobalamin (VITAMIN B12 PO) Take 1 tablet by mouth daily.  . Flaxseed, Linseed, (FLAX SEED OIL) 1000 MG CAPS Take 1 capsule by mouth daily.   . hydrochlorothiazide (MICROZIDE) 12.5 MG capsule Take 1 capsule (12.5 mg total) by mouth 2 (two) times daily.  . metoprolol tartrate (LOPRESSOR) 50 MG tablet TAKE (1) TABLET TWICE A DAY.  . nitroGLYCERIN (NITROSTAT) 0.4 MG SL tablet Place 1 tablet (0.4 mg total) under the tongue every 5 (five) minutes as needed for chest pain.  [name] OVER THE COUNTER MEDICATION Tumeric  . traMADol (ULTRAM) 50 MG tablet Take 1 tablet (50 mg total) by mouth daily as needed.  . Evolocumab (REPATHA SURECLICK) 546 MG/ML SOAJ Inject 140 mg into the skin every 14 (fourteen) days. (Patient not taking: Reported on 11/30/2017)   Facility-Administered  Encounter Medications as of 11/30/2017  Medication  . DOBUTamine (DOBUTREX) 1,000 mcg/mL in dextrose 5% 250 mL infusion    Activities of Daily Living In your present state of health, do you have any difficulty performing the following activities: 11/30/2017  Hearing? Y  Comment Has noticed decrease in hearing when in crowds.  Has an audiolgoy referral in place.  Plans to go for evaluation soon.   Vision? N  Difficulty concentrating or making decisions? N  Walking or climbing stairs? N  Dressing or bathing? N  Doing errands, shopping? N  Preparing Food and eating ? N  Using the Toilet? N  In the past six months, have you accidently leaked urine? N  Do you have problems with loss of bowel control? N  Managing your Medications? N  Managing your Finances? N  Housekeeping or managing your Housekeeping? N  Some recent data might be hidden    Patient Care Team: Chipper Herb, MD as PCP - General (Family Medicine) Kathie Rhodes, MD as Consulting Physician (Urology) Katy Apo, MD as Consulting Physician (Ophthalmology)    Assessment:   This is a routine wellness examination for Znya.  Exercise Activities and Dietary recommendations     Mrs. Feldstein eats 3 meals per day.  She likes to cook, and makes home made meals most of the time instead of eating out. For breakfast she usually has eggs, [place] bacon, and toast, salad and 1/2 sandwich for lunch, and meat and vegetables for supper.  Recommended diet of mostly lean proteins, vegetables, fruits, and whole grains.    Patient walks 3 days per week for 30 minutes each session.  Goals    . DIET - REDUCE SUGAR INTAKE     Patient states she would like to lose 10 lbs and plans to do this by decreasing her carbohydrate intake.        Fall Risk Fall Risk  12/01/2017 10/14/2017 08/19/2017 06/23/2017 02/11/2017  Falls  in the past year? No No No No No   Is the patient's home free of loose throw rugs in walkways, pet beds, electrical cords,  etc?   yes      Grab bars in the bathroom? no      Handrails on the stairs?   yes      Adequate lighting?   yes    Depression Screen PHQ 2/9 Scores 12/01/2017 10/14/2017 08/19/2017 06/23/2017  PHQ - 2 Score 0 0 0 0     Cognitive Function MMSE - Mini Mental State Exam 12/01/2017  Orientation to time 5  Orientation to Place 5  Registration 3  Attention/ Calculation 5  Recall 3  Language- name 2 objects 2  Language- repeat 1  Language- follow 3 step command 3  Language- read & follow direction 1  Write a sentence 1  Copy design 1  Total score 30        Immunization History  Administered Date(s) Administered  . Influenza, High Dose Seasonal PF 12/07/2015, 12/16/2016  . Influenza,inj,Quad PF,6+ Mos 12/21/2012, 01/04/2014, 12/25/2014  . Pneumococcal Conjugate-13 06/28/2014  . Pneumococcal Polysaccharide-23 11/04/2012  . Tdap 10/09/2010    Qualifies for Shingles Vaccine?Yes, patient scheduled to come back 12/21/17 for Shingrix.  She wants to wait on getting it because she is going out of town tomorrow.   Screening Tests Health Maintenance  Topic Date Due  . INFLUENZA VACCINE  10/08/2017  . MAMMOGRAM  05/21/2019  . DEXA SCAN  08/20/2019  . TETANUS/TDAP  10/08/2020  . PNA vac Low Risk Adult  Completed    Cancer Screenings: Lung: Low Dose CT Chest recommended if Age 24-80 years, 30 pack-year currently smoking OR have quit w/in 15years. Patient does not qualify. Breast:  Up to date on Mammogram? Yes   Up to date of Bone Density/Dexa? Yes Colorectal:  Not indicated  Additional Screenings:  Hepatitis C Screening:  Not indicated     Plan:     Work on your goal of continuing to reduce carbohydrates in your diet. Bring a copy of your Advance Directives to our office to be filed in your chart.  Plan to get Shingles vaccine (scheduled for 12/21/17) and flu vaccine.    I have personally reviewed and noted the following in the patient's chart:   . Medical and social  history . Use of alcohol, tobacco or illicit drugs  . Current medications and supplements . Functional ability and status . Nutritional status . Physical activity . Advanced directives . List of other physicians . Hospitalizations, surgeries, and ER visits in previous 12 months . Vitals . Screenings to include cognitive, depression, and falls . Referrals and appointments  In addition, I have reviewed and discussed with patient certain preventive protocols, quality metrics, and best practice recommendations. A written personalized care plan for preventive services as well as general preventive health recommendations were provided to patient.     Larell Baney M, RN  12/01/2017  I have reviewed and agree with the above AWV documentation.   Mary-Margaret Hassell Done, FNP

## 2017-12-01 NOTE — Telephone Encounter (Signed)
Gabriela Smith was seen yesterday for her AWV.  She requested a refill on tramadol.  Advised her I would have to send to Dr. Laurance Flatten for authorization as it is a controlled medication now.  Patient agreeable.

## 2017-12-01 NOTE — Telephone Encounter (Signed)
Okay to refill tramadol to take as little as possible as long as she has been taking it correctly up to this time.

## 2017-12-02 ENCOUNTER — Ambulatory Visit: Payer: Medicare Other | Admitting: Family Medicine

## 2017-12-21 ENCOUNTER — Ambulatory Visit (INDEPENDENT_AMBULATORY_CARE_PROVIDER_SITE_OTHER): Payer: Medicare Other | Admitting: *Deleted

## 2017-12-21 DIAGNOSIS — Z23 Encounter for immunization: Secondary | ICD-10-CM | POA: Diagnosis not present

## 2017-12-21 NOTE — Progress Notes (Signed)
Pt given flu vaccine Will come back for Shingrix

## 2017-12-28 ENCOUNTER — Other Ambulatory Visit: Payer: Self-pay | Admitting: Family Medicine

## 2018-01-05 DIAGNOSIS — Z8551 Personal history of malignant neoplasm of bladder: Secondary | ICD-10-CM | POA: Diagnosis not present

## 2018-02-18 ENCOUNTER — Encounter: Payer: Self-pay | Admitting: Family Medicine

## 2018-02-18 ENCOUNTER — Ambulatory Visit (INDEPENDENT_AMBULATORY_CARE_PROVIDER_SITE_OTHER): Payer: Medicare Other | Admitting: Family Medicine

## 2018-02-18 VITALS — BP 137/70 | HR 87 | Temp 97.2°F | Ht 64.0 in | Wt 183.0 lb

## 2018-02-18 DIAGNOSIS — I10 Essential (primary) hypertension: Secondary | ICD-10-CM | POA: Diagnosis not present

## 2018-02-18 DIAGNOSIS — E559 Vitamin D deficiency, unspecified: Secondary | ICD-10-CM

## 2018-02-18 DIAGNOSIS — G629 Polyneuropathy, unspecified: Secondary | ICD-10-CM

## 2018-02-18 DIAGNOSIS — E785 Hyperlipidemia, unspecified: Secondary | ICD-10-CM

## 2018-02-18 DIAGNOSIS — I714 Abdominal aortic aneurysm, without rupture, unspecified: Secondary | ICD-10-CM

## 2018-02-18 DIAGNOSIS — C679 Malignant neoplasm of bladder, unspecified: Secondary | ICD-10-CM

## 2018-02-18 DIAGNOSIS — Z6831 Body mass index (BMI) 31.0-31.9, adult: Secondary | ICD-10-CM

## 2018-02-18 DIAGNOSIS — Z789 Other specified health status: Secondary | ICD-10-CM

## 2018-02-18 DIAGNOSIS — I2510 Atherosclerotic heart disease of native coronary artery without angina pectoris: Secondary | ICD-10-CM

## 2018-02-18 DIAGNOSIS — E7800 Pure hypercholesterolemia, unspecified: Secondary | ICD-10-CM

## 2018-02-18 DIAGNOSIS — I739 Peripheral vascular disease, unspecified: Secondary | ICD-10-CM

## 2018-02-18 NOTE — Progress Notes (Signed)
Subjective:    Patient ID: Gabriela Smith, female    DOB: 01/10/1941, 77 y.o.   MRN: [id number]  HPI Pt here for follow up and management of chronic medical problems which includes hypertension and hyperlipidemia. She is taking medication regularly.  The patient is doing well overall but does continue to complain with neuropathy in her legs and feet.  She will get lab work today.  She is up-to-date on her GYN visits.  She has had her mammogram done and DEXA scan done and a chest x-ray done 1 year ago.  The FOBT was done in January of this year.  She continues to follow-up with a urologist because of her history of bladder cancer.  She has complaints of arthritis of course of bladder cancer and hyperlipidemia and hypertension.  There is a family history of heart disease not only in both parents but in her son.  The patient does have aortic atherosclerosis abdominal aortic aneurysm bladder cancer which is continuing to be followed by the urologist.  She is also had hip replacement left knee arthroplasty and right knee arthroplasty.  Has been intolerant of statin drugs and even Repatha.  She got 1 shot of Repatha and had increased blood pressure lethargy muscle aches headache and weakness that lasted for 1 week.  She has had this burning and tingling sensation in both feet for about 3 weeks.  Walking makes it better.  She describes no falls.  She is up-to-date on her eye exams.  She sees the cardiologist, Dr. Percival Spanish regularly.  She sees him on a yearly basis.  She sees the urologist for her bladder cancer every 6 months.  She denies any chest pain pressure or tightness or palpitation.  She is getting over a respiratory infection and still has a slight cough but shortness of breath is improved.  She also with this virus had some nausea and vomiting but this is also improved.  She has not seen any blood in the stool or had any black tarry bowel movements.  Her bowel habits are stable.  She has not seen any blood  in the urine.    Patient Active Problem List   Diagnosis Date Noted  . Aortic atherosclerosis ([place]) 02/11/2017  . Atrophic vaginitis 09/18/2016  . Acute cystitis without hematuria 09/18/2016  . S/P right THA, AA 12/25/2015  . S/P hip replacement 12/25/2015  . AAA (abdominal aortic aneurysm) without rupture ([place]) 08/24/2015  . Statin intolerance 05/23/2013  . Osteopenia 04/20/2013  . Degenerative disc disease, lumbar 02/08/2013  . Vitamin D deficiency 02/08/2013  . Overweight (BMI 25.0-29.9) 11/24/2012  . S/P left TKA 11/22/2012  . Malignant neoplasm of urinary bladder ([place]) 11/04/2012  . Osteoarthritis of left knee 11/04/2012  . Hyperlipidemia LDL goal <70 11/28/2008  . Hypertension 11/28/2008  . CORONARY ATHEROSCLEROSIS NATIVE CORONARY ARTERY 11/28/2008   Outpatient Encounter Medications as of 02/18/2018  Medication Sig  . acetaminophen (TYLENOL) 500 MG tablet Take 2 tablets (1,000 mg total) by mouth every 8 (eight) hours.  [name] b complex vitamins tablet Take 1 tablet by mouth daily. One a day  . cholecalciferol (VITAMIN D) 1000 UNITS tablet Take 1,000 Units by mouth daily.   . clotrimazole-betamethasone (LOTRISONE) cream Apply 1 application topically 2 (two) times daily. (Patient taking differently: Apply 1 application topically 2 (two) times daily as needed. )  . Co-Enzyme Q10 200 MG CAPS Take 1 capsule by mouth daily.  . Cyanocobalamin (VITAMIN B12 PO) Take 1 tablet by mouth  daily.  . Flaxseed, Linseed, (FLAX SEED OIL) 1000 MG CAPS Take 1 capsule by mouth daily.   . hydrochlorothiazide (MICROZIDE) 12.5 MG capsule Take 1 capsule (12.5 mg total) by mouth 2 (two) times daily.  . metoprolol tartrate (LOPRESSOR) 50 MG tablet TAKE (1) TABLET TWICE A DAY.  [name] OVER THE COUNTER MEDICATION Tumeric  . nitroGLYCERIN (NITROSTAT) 0.4 MG SL tablet Place 1 tablet (0.4 mg total) under the tongue every 5 (five) minutes as needed for chest pain. (Patient not taking: Reported on 02/18/2018)  .  traMADol (ULTRAM) 50 MG tablet Take 1 tablet (50 mg total) by mouth daily as needed. (Patient not taking: Reported on 02/18/2018)  . [DISCONTINUED] Evolocumab (REPATHA SURECLICK) 284 MG/ML SOAJ Inject 140 mg into the skin every 14 (fourteen) days. (Patient not taking: Reported on 11/30/2017)   Facility-Administered Encounter Medications as of 02/18/2018  Medication  . DOBUTamine (DOBUTREX) 1,000 mcg/mL in dextrose 5% 250 mL infusion      Review of Systems  Constitutional: Negative.   HENT: Negative.   Eyes: Negative.   Respiratory: Negative.   Cardiovascular: Negative.   Gastrointestinal: Negative.   Endocrine: Negative.   Genitourinary: Negative.   Musculoskeletal: Negative.   Skin: Negative.   Allergic/Immunologic: Negative.   Neurological: Positive for numbness (neuropathy - bilateral lower legs and feet.).  Hematological: Negative.   Psychiatric/Behavioral: Negative.        Objective:   Physical Exam Vitals signs and nursing note reviewed.  Constitutional:      Appearance: Normal appearance. She is well-developed. She is obese. She is not ill-appearing.     Comments: The patient is pleasant and alert.  HENT:     Head: Normocephalic and atraumatic.     Right Ear: Tympanic membrane, ear canal and external ear normal. There is no impacted cerumen.     Left Ear: Tympanic membrane, ear canal and external ear normal. There is no impacted cerumen.     Nose: Nose normal. No congestion or rhinorrhea.     Mouth/Throat:     Mouth: Mucous membranes are moist.     Pharynx: Oropharynx is clear.  Eyes:     General: No scleral icterus.       Right eye: No discharge.        Left eye: No discharge.     Conjunctiva/sclera: Conjunctivae normal.     Pupils: Pupils are equal, round, and reactive to light.  Neck:     Musculoskeletal: Normal range of motion and neck supple. No neck rigidity.     Thyroid: No thyromegaly.     Vascular: No carotid bruit or JVD.  Cardiovascular:     Rate  and Rhythm: Normal rate and regular rhythm.     Heart sounds: Normal heart sounds. No murmur.     Comments: The heart is regular at 72/min with diminished pedal pulses.  Patient has had a vascular surgeon visit and was told that there was no problem with her lower extremity circulation and she does deny any claudication. Pulmonary:     Effort: Pulmonary effort is normal.     Breath sounds: Normal breath sounds. No wheezing or rales.     Comments: Clear anteriorly and posteriorly Abdominal:     General: Bowel sounds are normal.     Palpations: Abdomen is soft. There is no mass.     Tenderness: There is no abdominal tenderness.  Musculoskeletal: Normal range of motion.        General: No swelling or tenderness.  Right lower leg: No edema.     Left lower leg: No edema.  Lymphadenopathy:     Cervical: No cervical adenopathy.  Skin:    General: Skin is warm and dry.     Findings: No bruising or erythema.  Neurological:     Mental Status: She is alert and oriented to person, place, and time.     Cranial Nerves: No cranial nerve deficit.     Deep Tendon Reflexes: Reflexes are normal and symmetric.  Psychiatric:        Mood and Affect: Mood normal.        Behavior: Behavior normal.        Thought Content: Thought content normal.        Judgment: Judgment normal.     Comments: The patient's mood affect and behavior are all normal for her.     BP 137/70 (BP Location: Left Arm)   Pulse 87   Temp (!) 97.2 F (36.2 C) (Oral)   Ht _0  (1.626 m)   Wt 183 lb (83 kg)   BMI 31.41 kg/m        Assessment & Plan:  1. Hypertension -The blood pressure is good today and she will continue with current treatment - BMP8+EGFR - CBC with Differential/Platelet - Hepatic function panel  2. ASCVD (arteriosclerotic cardiovascular disease) -Continue to follow-up with cardiology - CBC with Differential/Platelet - Lipid panel  3. Pure hypercholesterolemia -Patient has failed statin therapy  and Repatha therapy and she will continue with his aggressive therapeutic lifestyle changes as possible - CBC with Differential/Platelet - Lipid panel  4. Vitamin D deficiency -Continue with vitamin D replacement - VITAMIN D 25 Hydroxy (Vit-D Deficiency, Fractures)  5. Neuropathy - Magnesium - Vitamin B12  6. BMI 31.0-31.9,adult -To need to work aggressively on weight loss through diet and exercise  7. AAA (abdominal aortic aneurysm) without rupture ([place]) -Follow-up with cardiology as planned with repeat Dopplers as needed  8. Atherosclerosis of native coronary artery of native heart without angina pectoris -Follow-up with cardiology as planned  9. Statin intolerance -Patient is also Repatha intolerant.  She will continue with aggressive therapeutic lifestyle changes.  10. Hyperlipidemia LDL goal <70 -Continue with aggressive therapeutic lifestyle changes  11. Peripheral vascular insufficiency (H[facility]) -Continue to walk and exercise regularly  12. Malignant neoplasm of urinary bladder, unspecified site [facility]) -Follow-up with urology and Dr. Karsten Ro as planned  No orders of the defined types were placed in this encounter.  Patient Instructions                       Medicare Annual Wellness Visit  [facility] and the medical providers at [facility] strive to bring you the best medical care.  In doing so we not only want to address your current medical conditions and concerns but also to detect new conditions early and prevent illness, disease and health-related problems.    Medicare offers a yearly Wellness Visit which allows our clinical staff to assess your need for preventative services including immunizations, lifestyle education, counseling to decrease risk of preventable diseases and screening for fall risk and other medical concerns.    This visit is provided free of charge (no copay) for all Medicare recipients. The clinical pharmacists at [facility] have begun to conduct these Wellness Visits which will also include a thorough review of all your medications.    As you primary medical provider recommend that you make  an appointment for your Annual Wellness Visit if you have not done so already this year.  You may set up this appointment before you leave today or you may call back ([phone]) and schedule an appointment.  Please make sure when you call that you mention that you are scheduling your Annual Wellness Visit with the clinical pharmacist so that the appointment may be made for the proper length of time.     Continue current medications. Continue good therapeutic lifestyle changes which include good diet and exercise. Fall precautions discussed with patient. If an FOBT was given today- please return it to our front desk. If you are over 27 years old - you may need Prevnar 80 or the adult Pneumonia vaccine.  **Flu shots are available--- please call and schedule a FLU-CLINIC appointment**  After your visit with [place] today you will receive a survey in the mail or online from [employer] regarding your care with [place]. Please take a moment to fill this out. Your feedback is very important to [place] as you can help [place] better understand your patient needs as well as improve your experience and satisfaction. WE CARE ABOUT YOU!!!   Continue to follow-up with cardiology and urology We will call with lab work results of stenosis results become available Use Mucinex maximum strength or plain which is the 600 mg twice daily with a large glass of water as directed for cough and take the Mucinex with a large glass of water. Continue to drink plenty of fluids and stay well-hydrated Walk regularly as this will improve the circulation to your lower legs If you develop any increasing pain with walking you should get back in touch with [place] or the vascular surgeon.  Arrie Senate MD

## 2018-02-18 NOTE — Patient Instructions (Addendum)
Medicare Annual Wellness Visit  [facility] and the medical providers at [facility] strive to bring you the best medical care.  In doing so we not only want to address your current medical conditions and concerns but also to detect new conditions early and prevent illness, disease and health-related problems.    Medicare offers a yearly Wellness Visit which allows our clinical staff to assess your need for preventative services including immunizations, lifestyle education, counseling to decrease risk of preventable diseases and screening for fall risk and other medical concerns.    This visit is provided free of charge (no copay) for all Medicare recipients. The clinical pharmacists at [facility] have begun to conduct these Wellness Visits which will also include a thorough review of all your medications.    As you primary medical provider recommend that you make an appointment for your Annual Wellness Visit if you have not done so already this year.  You may set up this appointment before you leave today or you may call back ([phone]) and schedule an appointment.  Please make sure when you call that you mention that you are scheduling your Annual Wellness Visit with the clinical pharmacist so that the appointment may be made for the proper length of time.     Continue current medications. Continue good therapeutic lifestyle changes which include good diet and exercise. Fall precautions discussed with patient. If an FOBT was given today- please return it to our front desk. If you are over 15 years old - you may need Prevnar 42 or the adult Pneumonia vaccine.  **Flu shots are available--- please call and schedule a FLU-CLINIC appointment**  After your visit with [place] today you will receive a survey in the mail or online from [employer] regarding your care with [place]. Please take a moment to fill this out. Your feedback is very  important to [place] as you can help [place] better understand your patient needs as well as improve your experience and satisfaction. WE CARE ABOUT YOU!!!   Continue to follow-up with cardiology and urology We will call with lab work results of stenosis results become available Use Mucinex maximum strength or plain which is the 600 mg twice daily with a large glass of water as directed for cough and take the Mucinex with a large glass of water. Continue to drink plenty of fluids and stay well-hydrated Walk regularly as this will improve the circulation to your lower legs If you develop any increasing pain with walking you should get back in touch with [place] or the vascular surgeon.

## 2018-02-19 ENCOUNTER — Encounter: Payer: Self-pay | Admitting: *Deleted

## 2018-02-19 LAB — BMP8+EGFR
BUN/Creatinine Ratio: 13 (ref 12–28)
BUN: 10 mg/dL (ref 8–27)
CO2: 28 mmol/L (ref 20–29)
Calcium: 9 mg/dL (ref 8.7–10.3)
Chloride: 99 mmol/L (ref 96–106)
Creatinine, Ser: 0.76 mg/dL (ref 0.57–1.00)
GFR calc Af Amer: 86 mL/min/{1.73_m2} (ref >59)
GFR calc non Af Amer: 75 mL/min/{1.73_m2} (ref >59)
Glucose: 105 mg/dL — ABNORMAL HIGH (ref 65–99)
Potassium: 3.7 mmol/L (ref 3.5–5.2)
Sodium: 141 mmol/L (ref 134–144)

## 2018-02-19 LAB — CBC WITH DIFFERENTIAL/PLATELET
Basophils Absolute: 0 10*3/uL (ref 0.0–0.2)
Basos: 1 %
EOS (ABSOLUTE): 0.1 10*3/uL (ref 0.0–0.4)
Eos: 1 %
Hematocrit: 34.1 % (ref 34.0–46.6)
Hemoglobin: 12 g/dL (ref 11.1–15.9)
Immature Grans (Abs): 0 10*3/uL (ref 0.0–0.1)
Immature Granulocytes: 0 %
Lymphocytes Absolute: 2.3 10*3/uL (ref 0.7–3.1)
Lymphs: 35 %
MCH: 32.8 pg (ref 26.6–33.0)
MCHC: 35.2 g/dL (ref 31.5–35.7)
MCV: 93 fL (ref 79–97)
Monocytes Absolute: 0.4 10*3/uL (ref 0.1–0.9)
Monocytes: 7 %
Neutrophils Absolute: 3.6 10*3/uL (ref 1.4–7.0)
Neutrophils: 56 %
Platelets: 250 10*3/uL (ref 150–450)
RBC: 3.66 x10E6/uL — ABNORMAL LOW (ref 3.77–5.28)
RDW: 12.3 % (ref 12.3–15.4)
WBC: 6.5 10*3/uL (ref 3.4–10.8)

## 2018-02-19 LAB — HEPATIC FUNCTION PANEL
ALT: 22 IU/L (ref 0–32)
AST: 15 IU/L (ref 0–40)
Albumin: 4.3 g/dL (ref 3.5–4.8)
Alkaline Phosphatase: 82 IU/L (ref 39–117)
Bilirubin Total: 0.2 mg/dL (ref 0.0–1.2)
Bilirubin, Direct: 0.08 mg/dL (ref 0.00–0.40)
Total Protein: 6.6 g/dL (ref 6.0–8.5)

## 2018-02-19 LAB — LIPID PANEL
Chol/HDL Ratio: 4.4 ratio (ref 0.0–4.4)
Cholesterol, Total: 193 mg/dL (ref 100–199)
HDL: 44 mg/dL (ref >39)
LDL Calculated: 108 mg/dL — ABNORMAL HIGH (ref 0–99)
Triglycerides: 205 mg/dL — ABNORMAL HIGH (ref 0–149)
VLDL Cholesterol Cal: 41 mg/dL — ABNORMAL HIGH (ref 5–40)

## 2018-02-19 LAB — MAGNESIUM: Magnesium: 2.1 mg/dL (ref 1.6–2.3)

## 2018-02-19 LAB — VITAMIN B12: Vitamin B-12: 926 pg/mL (ref 232–1245)

## 2018-02-19 LAB — VITAMIN D 25 HYDROXY (VIT D DEFICIENCY, FRACTURES): Vit D, 25-Hydroxy: 38.9 ng/mL (ref 30.0–100.0)

## 2018-02-25 ENCOUNTER — Other Ambulatory Visit: Payer: Self-pay | Admitting: Family Medicine

## 2018-03-18 ENCOUNTER — Encounter: Payer: Self-pay | Admitting: Family Medicine

## 2018-03-18 ENCOUNTER — Ambulatory Visit (INDEPENDENT_AMBULATORY_CARE_PROVIDER_SITE_OTHER): Payer: Medicare Other | Admitting: Family Medicine

## 2018-03-18 VITALS — BP 150/81 | HR 91 | Temp 97.2°F | Ht 64.0 in | Wt 189.0 lb

## 2018-03-18 DIAGNOSIS — B9789 Other viral agents as the cause of diseases classified elsewhere: Secondary | ICD-10-CM | POA: Diagnosis not present

## 2018-03-18 DIAGNOSIS — J028 Acute pharyngitis due to other specified organisms: Secondary | ICD-10-CM | POA: Diagnosis not present

## 2018-03-18 DIAGNOSIS — J029 Acute pharyngitis, unspecified: Secondary | ICD-10-CM

## 2018-03-18 LAB — RAPID STREP SCREEN (MED CTR MEBANE ONLY): Strep Gp A Ag, IA W/Reflex: NEGATIVE

## 2018-03-18 LAB — CULTURE, GROUP A STREP

## 2018-03-18 NOTE — Patient Instructions (Signed)
Pharyngitis  Pharyngitis is a sore throat (pharynx). This is when there is redness, pain, and swelling in your throat. Most of the time, this condition gets better on its own. In some cases, you may need medicine. Follow these instructions at home:  Take over-the-counter and prescription medicines only as told by your doctor. ? If you were prescribed an antibiotic medicine, take it as told by your doctor. Do not stop taking the antibiotic even if you start to feel better. ? Do not give children aspirin. Aspirin has been linked to Reye syndrome.  Drink enough water and fluids to keep your pee (urine) clear or pale yellow.  Get a lot of rest.  Rinse your mouth (gargle) with a salt-water mixture 3-4 times a day or as needed. To make a salt-water mixture, completely dissolve -1 tsp of salt in 1 cup of warm water.  If your doctor approves, you may use throat lozenges or sprays to soothe your throat. Contact a doctor if:  You have large, tender lumps in your neck.  You have a rash.  You cough up green, yellow-brown, or bloody spit. Get help right away if:  You have a stiff neck.  You drool or cannot swallow liquids.  You cannot drink or take medicines without throwing up.  You have very bad pain that does not go away with medicine.  You have problems breathing, and it is not from a stuffy nose.  You have new pain and swelling in your knees, ankles, wrists, or elbows. Summary  Pharyngitis is a sore throat (pharynx). This is when there is redness, pain, and swelling in your throat.  If you were prescribed an antibiotic medicine, take it as told by your doctor. Do not stop taking the antibiotic even if you start to feel better.  Most of the time, pharyngitis gets better on its own. Sometimes, you may need medicine. This information is not intended to replace advice given to you by your health care provider. Make sure you discuss any questions you have with your health care  provider. Document Released: 08/13/2007 Document Revised: 04/01/2016 Document Reviewed: 04/01/2016 Elsevier Interactive Patient Education  2019 Elsevier Inc.  

## 2018-03-18 NOTE — Progress Notes (Signed)
    Subjective:     Gabriela Smith is a 78 y.o. female who presents for evaluation of sore throat. Associated symptoms include sore throat. Onset of symptoms was 3 days ago, and have been gradually worsening since that time. She is drinking moderate amounts of fluids. She has not had a recent close exposure to someone with proven streptococcal pharyngitis.  The following portions of the patient's history were reviewed and updated as appropriate: allergies, current medications, past family history, past medical history, past social history, past surgical history and problem list.  Review of Systems Constitutional: positive for fatigue and malaise Ears, nose, mouth, throat, and face: positive for sore throat Respiratory: negative Cardiovascular: negative Gastrointestinal: negative Neurological: negative    Objective:    BP (!) 150/81   Pulse 91   Temp (!) 97.2 F (36.2 C) (Oral)   Ht 5\' 4"  (1.626 m)   Wt 189 lb (85.7 kg)   BMI 32.44 kg/m  General appearance: alert, cooperative and no distress Head: Normocephalic, without obvious abnormality, atraumatic Eyes: negative Ears: normal TM's and external ear canals both ears Nose: Nares normal. Septum midline. Mucosa normal. No drainage or sinus tenderness. Throat: abnormal findings: moderate oropharyngeal erythema and no exudate or edema Lungs: clear to auscultation bilaterally Heart: regular rate and rhythm, S1, S2 normal, no murmur, click, rub or gallop Skin: Skin color, texture, turgor normal. No rashes or lesions Lymph nodes: Cervical, supraclavicular, and axillary nodes normal. Neurologic: Grossly normal  Laboratory Strep test done. Results:negative.    Assessment:   Gabriela Smith was seen today for uri.  Diagnoses and all orders for this visit:  Sore throat Rapid strep negative.  -     Rapid Strep Screen ([facility] ONLY)  Acute viral pharyngitis Symptomatic care discussed. Report any new or worsening symptoms.       Plan:    Use of OTC analgesics recommended as well as salt water gargles. Follow up as needed.     The above assessment and management plan was discussed with the patient. The patient verbalized understanding of and has agreed to the management plan. Patient is aware to call the clinic if symptoms fail to improve or worsen. Patient is aware when to return to the clinic for a follow-up visit. Patient educated on when it is appropriate to go to the emergency department.   Monia Pouch, FNP-C [facility] [address] [place], [place] [address] [phone]

## 2018-03-19 ENCOUNTER — Other Ambulatory Visit: Payer: Self-pay | Admitting: Family Medicine

## 2018-05-12 ENCOUNTER — Other Ambulatory Visit: Payer: Self-pay | Admitting: Family Medicine

## 2018-05-12 DIAGNOSIS — Z1231 Encounter for screening mammogram for malignant neoplasm of breast: Secondary | ICD-10-CM

## 2018-06-07 ENCOUNTER — Other Ambulatory Visit: Payer: Self-pay | Admitting: *Deleted

## 2018-06-09 ENCOUNTER — Other Ambulatory Visit: Payer: Self-pay | Admitting: *Deleted

## 2018-06-09 ENCOUNTER — Ambulatory Visit: Payer: Medicare Other | Admitting: Cardiology

## 2018-06-09 MED ORDER — TRAMADOL HCL 50 MG PO TABS: 50.0000 mg | ORAL_TABLET | Freq: Every day | ORAL | 0 refills | Status: DC | PRN

## 2018-06-10 ENCOUNTER — Other Ambulatory Visit: Payer: Self-pay | Admitting: *Deleted

## 2018-06-10 MED ORDER — TRAMADOL HCL 50 MG PO TABS: 50.0000 mg | ORAL_TABLET | Freq: Every day | ORAL | 0 refills | Status: DC | PRN

## 2018-06-23 ENCOUNTER — Ambulatory Visit: Payer: Medicare Other

## 2018-07-07 ENCOUNTER — Telehealth: Payer: Self-pay | Admitting: Cardiology

## 2018-07-07 NOTE — Telephone Encounter (Signed)
The patient verbally consented for a telehealth phone visit with [facility] and understands that his/her insurance company will be billed for the encounter. °  °Asked to have weight & vitals ready prior to telephone call.   °

## 2018-07-18 NOTE — Progress Notes (Signed)
Virtual Visit via Video Note   This visit type was conducted due to national recommendations for restrictions regarding the COVID-19 Pandemic (e.g. social distancing) in an effort to limit this patient's exposure and mitigate transmission in our community.  Due to her co-morbid illnesses, this patient is at least at moderate risk for complications without adequate follow up.  This format is felt to be most appropriate for this patient at this time.  All issues noted in this document were discussed and addressed.  A limited physical exam was performed with this format.  Please refer to the patient's chart for her consent to telehealth for [facility].   Date:  07/19/2018   ID:  Gabriela Smith, DOB [birth year], MRN [id number]  Patient Location: Home Provider Location: Home  PCP:  Chipper Herb, MD  Cardiologist:  Minus Breeding, MD  Electrophysiologist:  None   Evaluation Performed:  Follow-Up Visit  Chief Complaint:  CAD  History of Present Illness:    Gabriela Smith is a 78 y.o. female with PCI.  She had her last perfusion study in 2017.  She has been doing very well.  The patient denies any new symptoms such as chest discomfort, neck or arm discomfort. There has been no new shortness of breath, PND or orthopnea. There have been no reported palpitations, presyncope or syncope.  She is active walking up stairs many times a day.  She lives in an apartment above her garage and 1 of her children's homes.  She takes care of her husband who has Parkinson's disease.  Her daughter-in-law in [place] died last year from pancreatic cancer.  Her granddaughter Katharina Caper is being raised by her son.  Otherwise she has been doing well.  She remains active without any chest symptoms as described.  The patient does not have symptoms concerning for COVID-19 infection (fever, chills, cough, or new shortness of breath).    Past Medical History:  Diagnosis Date  . AAA (abdominal aortic aneurysm) [facility])     cardiology aware   . Arthritis    OA lt knee- cortizone inj. q 4 months AND OA ALSO IN BACK. Had knee replaced-issue resolved  . CAD (coronary artery disease) CARDIOLOGIST- DR [facility]--  VISIT 01-02-10 IN EPIC   1993-- PTCA. Pt describes a near total blockage of apparently the LAD and a 65% stenosis elsewhere.  . Cancer ([place])   . History of bladder cancer followed by dr Karsten Ro   hx  TCC of bladder ,  Ta G3-  first occurence 02-20-2012--  s/p BCG tx's  and  mitomycin C  . Hyperlipidemia 1993  . Hypertension 1993  . Nocturia   . Osteopenia   . Sigmoid diverticulosis    Past Surgical History:  Procedure Laterality Date  . CHOLECYSTECTOMY  2003 (approx)  . COLONOSCOPY  05-31-2003  . CORONARY ANGIOPLASTY  1993   to LAD  . CYSTOSCOPY WITH BIOPSY  05/09/2011   Procedure: CYSTOSCOPY WITH BIOPSY;  Surgeon: Claybon Jabs, MD;  Location: [facility];  Service: Urology;  Laterality: N/A;  WITH BLADDER BIOPSY GYRUS  . TOTAL HIP ARTHROPLASTY Right 12/25/2015   Procedure: RIGHT TOTAL HIP ARTHROPLASTY ANTERIOR APPROACH;  Surgeon: Paralee Cancel, MD;  [facility];  Service: Orthopedics;  Laterality: Right;  . TOTAL KNEE ARTHROPLASTY Left 11/22/2012   Procedure: LEFT TOTAL KNEE ARTHROPLASTY;  Surgeon: Mauri Pole, MD;  Location[facility];  Service: Orthopedics;  Laterality: Left;  . TRANSURETHRAL RESECTION OF BLADDER TUMOR  03/14/2011   Procedure: TRANSURETHRAL RESECTION OF BLADDER TUMOR (TURBT);  Surgeon: Claybon Jabs, MD;  Location: [facility];  Service: Urology;  Laterality: N/A;  . TRANSURETHRAL RESECTION OF BLADDER TUMOR  05/09/2011   Procedure: TRANSURETHRAL RESECTION OF BLADDER TUMOR (TURBT);  Surgeon: Claybon Jabs, MD;  Location: [facility];  Service: Urology;  Laterality: N/A;  . TRANSURETHRAL RESECTION OF BLADDER TUMOR  03/08/2012   Procedure: TRANSURETHRAL RESECTION OF BLADDER TUMOR (TURBT);  Surgeon: Claybon Jabs, MD;  Location: [facility];  Service: Urology;  Laterality: N/A;  . TRANSURETHRAL RESECTION OF BLADDER TUMOR WITH GYRUS (TURBT-GYRUS) N/A 03/24/2014   Procedure: TRANSURETHRAL RESECTION OF BLADDER TUMOR WITH GYRUS (TURBT-GYRUS);  Surgeon: Claybon Jabs, MD;  Location: [facility];  Service: Urology;  Laterality: N/A;  . UMBILICAL HERNIA REPAIR  2001  (approx)     Current Meds  Medication Sig  . acetaminophen (TYLENOL) 500 MG tablet Take 2 tablets (1,000 mg total) by mouth every 8 (eight) hours.  [name] b complex vitamins tablet Take 1 tablet by mouth daily. One a day  . cholecalciferol (VITAMIN D) 1000 UNITS tablet Take 1,000 Units by mouth daily.   . clotrimazole-betamethasone (LOTRISONE) cream Apply 1 application topically 2 (two) times daily. (Patient taking differently: Apply 1 application topically 2 (two) times daily as needed. )  . Co-Enzyme Q10 200 MG CAPS Take 1 capsule by mouth daily.  . Cyanocobalamin (VITAMIN B12 PO) Take 1 tablet by mouth daily.  . Flaxseed, Linseed, (FLAX SEED OIL) 1000 MG CAPS Take 1 capsule by mouth daily.   . hydrochlorothiazide (MICROZIDE) 12.5 MG capsule TAKE  (1)  CAPSULE  TWICE DAILY.  . metoprolol tartrate (LOPRESSOR) 50 MG tablet TAKE (1) TABLET TWICE A DAY.  . nitroGLYCERIN (NITROSTAT) 0.4 MG SL tablet Place 1 tablet (0.4 mg total) under the tongue every 5 (five) minutes as needed for chest pain.  [name] OVER THE COUNTER MEDICATION Tumeric  . traMADol (ULTRAM) 50 MG tablet Take 1 tablet (50 mg total) by mouth daily as needed.  . [DISCONTINUED] hydrochlorothiazide (MICROZIDE) 12.5 MG capsule TAKE  (1)  CAPSULE  TWICE DAILY.  . [DISCONTINUED] metoprolol tartrate (LOPRESSOR) 50 MG tablet TAKE (1) TABLET TWICE A DAY.     Allergies:   Fish allergy; Lasix [furosemide]; Macrodantin [nitrofurantoin macrocrystal]; Vicodin [hydrocodone-acetaminophen]; Zetia [ezetimibe]; Codeine; Latex; Niaspan [niacin er]; Statins; and Sulfa antibiotics   Social History    Tobacco Use  . Smoking status: Former Smoker    Packs/day: 0.50    Years: 5.00    Pack years: 2.50    Types: Cigarettes    Last attempt to quit: 03/09/1976    Years since quitting: 42.3  . Smokeless tobacco: Never Used  Substance Use Topics  . Alcohol use: Yes    Alcohol/week: 4.0 standard drinks    Types: 4 Glasses of wine per week    Comment: Occasionally  . Drug use: No     Family Hx: The patient's family history includes Coronary artery disease (age of onset: 51) in her father; Heart attack (age of onset: 71) in her daughter; Heart attack (age of onset: 47) in her mother; Heart disease (age of onset: 40) in her son; Hyperlipidemia in her father, mother, and sister; Osteoporosis in her sister.  ROS:   Please see the history of present illness.    As stated in the HPI and negative for all other systems.   Prior CV studies:  The following studies were reviewed today:    Labs/Other Tests and Data Reviewed:    EKG:  No ECG reviewed.  Recent Labs: 08/19/2017: TSH 3.120 02/18/2018: ALT 22; BUN 10; Creatinine, Ser 0.76; Hemoglobin 12.0; Magnesium 2.1; Platelets 250; Potassium 3.7; Sodium 141   Recent Lipid Panel Lab Results  Component Value Date/Time   CHOL 193 02/18/2018 11:16 AM   TRIG 205 (H) 02/18/2018 11:16 AM   TRIG 414 (H) 07/31/2016 09:42 AM   HDL 44 02/18/2018 11:16 AM   HDL 49 07/31/2016 09:42 AM   CHOLHDL 4.4 02/18/2018 11:16 AM   LDLCALC 108 (H) 02/18/2018 11:16 AM   LDLCALC 205 (H) 11/28/2013 12:15 PM   LDLDIRECT 193 (H) 10/21/2016 09:02 AM    Wt Readings from Last 3 Encounters:  07/19/18 176 lb (79.8 kg)  03/18/18 189 lb (85.7 kg)  02/18/18 183 lb (83 kg)     Objective:    Vital Signs:  BP 134/67   Pulse 76   Ht 5\' 3"  (1.6 m)   Wt 176 lb (79.8 kg)   BMI 31.18 kg/m    VITAL SIGNS:  reviewed GEN:  no acute distress EYES:  sclerae anicteric, EOMI - Extraocular Movements Intact NEURO:  alert and oriented x 3, no obvious focal deficit  PSYCH:  normal affect  ASSESSMENT & PLAN:     CAD:   The patient has no new sypmtoms.  No further cardiovascular testing is indicated.  We will continue with aggressive risk reduction and meds as listed.  AAA:   This was 2.4 x 2.7 two years ago and has been stable.  No further testing needed at this time.    DYSLIPIDEMIA:   She has been truly intolerant of statins.  She previously said she would consider PCSK9 and was to go to [facility].  Remarkably her LDL was dramatically reduced when it was checked in December.  This goes against all of the readings and we talked about this.  She should have this repeated and would be prepared to use a PCSK9 inhibitor if it is elevated at previous levels.  There was no intervention really except eating to try to change this.  I wonder if the December 2019 labs are in error.   PACs:   She does not feel these.  No further work-up.  HTN:  BP is okay.  I looked back at previous readings and has been fine.  No change in therapy.   Current medicines are reviewed at length with the patient today.  The patient does not have concerns regarding medicines.  The following changes have been made:  None  Labs/ tests ordered today include:  None   COVID-19 Education: The signs and symptoms of COVID-19 were discussed with the patient and how to seek care for testing (follow up with PCP or arrange E-visit).  The importance of social distancing was discussed today.  Time:   Today, I have spent 26 minutes with the patient with telehealth technology discussing the above problems.     Medication Adjustments/Labs and Tests Ordered: Current medicines are reviewed at length with the patient today.  Concerns regarding medicines are outlined above.   Tests Ordered: No orders of the defined types were placed in this encounter.   Medication Changes: Meds ordered this encounter  Medications  . metoprolol tartrate (LOPRESSOR) 50 MG tablet    Sig: TAKE (1) TABLET  TWICE A DAY.    Dispense:  180 tablet    Refill:  1  . hydrochlorothiazide (MICROZIDE) 12.5 MG capsule    Sig: TAKE  (1)  CAPSULE  TWICE DAILY.    Dispense:  180 capsule    Refill:  1    Disposition:  Follow up with me in [place] in 12 months.   Signed, Minus Breeding, MD  07/19/2018 11:35 AM    [place]

## 2018-07-19 ENCOUNTER — Encounter: Payer: Self-pay | Admitting: Cardiology

## 2018-07-19 ENCOUNTER — Telehealth (INDEPENDENT_AMBULATORY_CARE_PROVIDER_SITE_OTHER): Payer: Medicare Other | Admitting: Cardiology

## 2018-07-19 VITALS — BP 134/67 | HR 76 | Ht 63.0 in | Wt 176.0 lb

## 2018-07-19 DIAGNOSIS — I10 Essential (primary) hypertension: Secondary | ICD-10-CM

## 2018-07-19 DIAGNOSIS — Z7189 Other specified counseling: Secondary | ICD-10-CM | POA: Insufficient documentation

## 2018-07-19 DIAGNOSIS — I2510 Atherosclerotic heart disease of native coronary artery without angina pectoris: Secondary | ICD-10-CM | POA: Diagnosis not present

## 2018-07-19 DIAGNOSIS — E785 Hyperlipidemia, unspecified: Secondary | ICD-10-CM

## 2018-07-19 DIAGNOSIS — I714 Abdominal aortic aneurysm, without rupture, unspecified: Secondary | ICD-10-CM

## 2018-07-19 MED ORDER — HYDROCHLOROTHIAZIDE 12.5 MG PO CAPS: ORAL_CAPSULE | ORAL | 1 refills | Status: DC

## 2018-07-19 MED ORDER — METOPROLOL TARTRATE 50 MG PO TABS: ORAL_TABLET | ORAL | 1 refills | Status: DC

## 2018-07-19 NOTE — Patient Instructions (Signed)
Your physician wants you to follow-up in: [place] will receive a reminder letter in the mail two months in advance. If you don't receive a letter, please call our office to schedule the follow-up appointment.  Your physician recommends that you continue on your current medications as directed. Please refer to the Current Medication list given to you today.  Thank you for choosing [facility]!!

## 2018-08-05 DIAGNOSIS — Z8551 Personal history of malignant neoplasm of bladder: Secondary | ICD-10-CM | POA: Diagnosis not present

## 2018-08-19 ENCOUNTER — Ambulatory Visit (INDEPENDENT_AMBULATORY_CARE_PROVIDER_SITE_OTHER): Payer: Medicare Other | Admitting: Family Medicine

## 2018-08-19 ENCOUNTER — Encounter: Payer: Self-pay | Admitting: Family Medicine

## 2018-08-19 DIAGNOSIS — I10 Essential (primary) hypertension: Secondary | ICD-10-CM

## 2018-08-19 DIAGNOSIS — M51369 Other intervertebral disc degeneration, lumbar region without mention of lumbar back pain or lower extremity pain: Secondary | ICD-10-CM

## 2018-08-19 DIAGNOSIS — E785 Hyperlipidemia, unspecified: Secondary | ICD-10-CM

## 2018-08-19 DIAGNOSIS — I700 Atherosclerosis of aorta: Secondary | ICD-10-CM | POA: Diagnosis not present

## 2018-08-19 DIAGNOSIS — Z96652 Presence of left artificial knee joint: Secondary | ICD-10-CM

## 2018-08-19 DIAGNOSIS — M5136 Other intervertebral disc degeneration, lumbar region: Secondary | ICD-10-CM

## 2018-08-19 DIAGNOSIS — Z7189 Other specified counseling: Secondary | ICD-10-CM

## 2018-08-19 DIAGNOSIS — I714 Abdominal aortic aneurysm, without rupture, unspecified: Secondary | ICD-10-CM

## 2018-08-19 DIAGNOSIS — I2510 Atherosclerotic heart disease of native coronary artery without angina pectoris: Secondary | ICD-10-CM

## 2018-08-19 DIAGNOSIS — C672 Malignant neoplasm of lateral wall of bladder: Secondary | ICD-10-CM | POA: Diagnosis not present

## 2018-08-19 DIAGNOSIS — Z789 Other specified health status: Secondary | ICD-10-CM

## 2018-08-19 DIAGNOSIS — Z1211 Encounter for screening for malignant neoplasm of colon: Secondary | ICD-10-CM

## 2018-08-19 DIAGNOSIS — E559 Vitamin D deficiency, unspecified: Secondary | ICD-10-CM

## 2018-08-19 DIAGNOSIS — Z96642 Presence of left artificial hip joint: Secondary | ICD-10-CM

## 2018-08-19 NOTE — Progress Notes (Signed)
Virtual Visit Via telephone Note I connected with@ on 08/19/18 by telephone and verified that I am speaking with the correct person or authorized healthcare agent using two identifiers. Gabriela Smith is currently located at home and there are no unauthorized people in close proximity. I completed this visit while in a private location in my home .  This visit type was conducted due to national recommendations for restrictions regarding the COVID-19 Pandemic (e.g. social distancing).  This format is felt to be most appropriate for this patient at this time.  All issues noted in this document were discussed and addressed.  No physical exam was performed.    I discussed the limitations, risks, security and privacy concerns of performing an evaluation and management service by telephone and the availability of in person appointments. I also discussed with the patient that there may be a patient responsible charge related to this service. The patient expressed understanding and agreed to proceed.   Date:  08/19/2018    ID:  Gabriela Smith      October 01, 1940        [id number]   Patient Care Team Patient Care Team: Chipper Herb, MD as PCP - General (Family Medicine) Minus Breeding, MD as PCP - Cardiology (Cardiology) Kathie Rhodes, MD as Consulting Physician (Urology) Katy Apo, MD as Consulting Physician (Ophthalmology)  Reason for Visit: Primary Care Follow-up     History of Present Illness & Review of Systems:     Gabriela Smith is a 78 y.o. year old female primary care patient that presents today for a telehealth visit.  The patient is doing well.  She sounds good and she looks much younger than her stated age of 78 years old.  She is walking regularly even though she bruised her hip and this was the hip that she had had surgery on in the past.  She did talk to the orthopedist and that is to give it some more time and it is getting better.  She takes the tramadol only if needed for  pain and rarely takes 1 no more than 1 at a time once monthly.  Today she denies any chest pain pressure tightness or shortness of breath.  She only has occasional heartburn and indigestion normally takes a Tums for this when it happens.  She recently saw Dr. Karsten Ro and will not see him again for a year because she is doing so well with her bladder cancer recovery.  She also saw Dr. early and he did not feel like there was any significant peripheral vascular disease.  She will get back in touch with Dr. Alvan Dame if her hip does not continue to get better.  Review of systems as stated, otherwise negative.  The patient does not have symptoms concerning for COVID-19 infection (fever, chills, cough, or new shortness of breath).      Current Medications (Verified) Allergies as of 08/19/2018      Reactions   Fish Allergy Hives   Bottom Fish    Lasix [furosemide] Other (See Comments)   Edema, elevated BP   Macrodantin [nitrofurantoin Macrocrystal] Other (See Comments)   Unknown reaction    Vicodin [hydrocodone-acetaminophen] Other (See Comments)   SEVERE N/V   Zetia [ezetimibe] Other (See Comments)   Codeine Rash   Latex Itching, Rash   Reports Elastic in underwear, under wire bras, and now surgical cap cause rash and itching.    Niaspan [niacin Er] Other (See Comments)  Muscle aches   Statins Other (See Comments)   Muscle aches/ cramps   Sulfa Antibiotics Rash      Medication List       Accurate as of August 19, 2018  8:01 AM. If you have any questions, ask your nurse or doctor.        acetaminophen 500 MG tablet Commonly known as: TYLENOL Take 2 tablets (1,000 mg total) by mouth every 8 (eight) hours.   b complex vitamins tablet Take 1 tablet by mouth daily. One a day   cholecalciferol 1000 units tablet Commonly known as: VITAMIN D Take 1,000 Units by mouth daily.   clotrimazole-betamethasone cream Commonly known as: LOTRISONE Apply 1 application topically 2 (two) times  daily. What changed:   when to take this  reasons to take this   Co-Enzyme Q10 200 MG Caps Take 1 capsule by mouth daily.   Flax Seed Oil 1000 MG Caps Take 1 capsule by mouth daily.   hydrochlorothiazide 12.5 MG capsule Commonly known as: MICROZIDE TAKE  (1)  CAPSULE  TWICE DAILY.   metoprolol tartrate 50 MG tablet Commonly known as: LOPRESSOR TAKE (1) TABLET TWICE A DAY.   nitroGLYCERIN 0.4 MG SL tablet Commonly known as: NITROSTAT Place 1 tablet (0.4 mg total) under the tongue every 5 (five) minutes as needed for chest pain.   OVER THE COUNTER MEDICATION Tumeric   traMADol 50 MG tablet Commonly known as: ULTRAM Take 1 tablet (50 mg total) by mouth daily as needed.   VITAMIN B12 PO Take 1 tablet by mouth daily.           Allergies (Verified)    Fish allergy, Lasix [furosemide], Macrodantin [nitrofurantoin macrocrystal], Vicodin [hydrocodone-acetaminophen], Zetia [ezetimibe], Codeine, Latex, Niaspan [niacin er], Statins, and Sulfa antibiotics  Past Medical History Past Medical History:  Diagnosis Date   AAA (abdominal aortic aneurysm) [facility])    cardiology aware    Arthritis    OA lt knee- cortizone inj. q 4 months AND OA ALSO IN BACK. Had knee replaced-issue resolved   CAD (coronary artery disease) CARDIOLOGIST- DR [facility]--  VISIT 01-02-10 IN EPIC   1993-- PTCA. Pt describes a near total blockage of apparently the LAD and a 65% stenosis elsewhere.   Cancer [facility])    History of bladder cancer followed by dr Karsten Ro   hx  TCC of bladder ,  Ta G3-  first occurence 02-20-2012--  s/p BCG tx's  and  mitomycin C   Hyperlipidemia 1993   Hypertension 1993   Nocturia    Osteopenia    Sigmoid diverticulosis      Past Surgical History:  Procedure Laterality Date   CHOLECYSTECTOMY  2003 (approx)   COLONOSCOPY  05-31-2003   CORONARY ANGIOPLASTY  1993   to LAD   CYSTOSCOPY WITH BIOPSY  05/09/2011   Procedure: CYSTOSCOPY WITH BIOPSY;  Surgeon: Claybon Jabs, MD;  Location: [facility];  Service: Urology;  Laterality: N/A;  WITH BLADDER BIOPSY GYRUS   TOTAL HIP ARTHROPLASTY Right 12/25/2015   Procedure: RIGHT TOTAL HIP ARTHROPLASTY ANTERIOR APPROACH;  Surgeon: Paralee Cancel, MD;  [facility];  Service: Orthopedics;  Laterality: Right;   TOTAL KNEE ARTHROPLASTY Left 11/22/2012   Procedure: LEFT TOTAL KNEE ARTHROPLASTY;  Surgeon: Mauri Pole, MD;  [facility];  Service: Orthopedics;  Laterality: Left;   TRANSURETHRAL RESECTION OF BLADDER TUMOR  03/14/2011   Procedure: TRANSURETHRAL RESECTION OF BLADDER TUMOR (TURBT);  Surgeon: Claybon Jabs, MD;  Location[place] [facility];  Service: Urology;  Laterality: N/A;   TRANSURETHRAL RESECTION OF BLADDER TUMOR  05/09/2011   Procedure: TRANSURETHRAL RESECTION OF BLADDER TUMOR (TURBT);  Surgeon: Claybon Jabs, MD;  Location: [facility];  Service: Urology;  Laterality: N/A;   TRANSURETHRAL RESECTION OF BLADDER TUMOR  03/08/2012   Procedure: TRANSURETHRAL RESECTION OF BLADDER TUMOR (TURBT);  Surgeon: Claybon Jabs, MD;  Location: [facility];  Service: Urology;  Laterality: N/A;   TRANSURETHRAL RESECTION OF BLADDER TUMOR WITH GYRUS (TURBT-GYRUS) N/A 03/24/2014   Procedure: TRANSURETHRAL RESECTION OF BLADDER TUMOR WITH GYRUS (TURBT-GYRUS);  Surgeon: Claybon Jabs, MD;  Location: [facility];  Service: Urology;  Laterality: N/A;   UMBILICAL HERNIA REPAIR  2001  (approx)    Social History   Socioeconomic History   Marital status: Married    Spouse name: Not on file   Number of children: 3   Years of education: Not on file   Highest education level: Not on file  Occupational History   Not on file  Social Needs   Financial resource strain: Not on file   Food insecurity    Worry: Not on file    Inability: Not on file   Transportation needs    Medical: Not on file    Non-medical: Not on file  Tobacco Use    Smoking status: Former Smoker    Packs/day: 0.50    Years: 5.00    Pack years: 2.50    Types: Cigarettes    Quit date: 03/09/1976    Years since quitting: 42.4   Smokeless tobacco: Never Used  Substance and Sexual Activity   Alcohol use: Yes    Alcohol/week: 4.0 standard drinks    Types: 4 Glasses of wine per week    Comment: Occasionally   Drug use: No   Sexual activity: Never  Lifestyle   Physical activity    Days per week: Not on file    Minutes per session: Not on file   Stress: Not on file  Relationships   Social connections    Talks on phone: Not on file    Gets together: Not on file    Attends religious service: Not on file    Active member of club or organization: Not on file    Attends meetings of clubs or organizations: Not on file    Relationship status: Not on file  Other Topics Concern   Not on file  Social History Narrative   Married with 3 children and 4 grandchildren.  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 with Parkinsons     Family History  Problem Relation Age of Onset   Coronary artery disease Father 14   Hyperlipidemia Father    Heart attack Mother 21   Hyperlipidemia Mother    Osteoporosis Sister    Hyperlipidemia Sister    Heart attack Daughter 36       CABG x 5   Heart disease Son 59       Stents x 3      Labs/Other Tests and Data Reviewed:    Wt Readings from Last 3 Encounters:  07/19/18 176 lb (79.8 kg)  03/18/18 189 lb (85.7 kg)  02/18/18 183 lb (83 kg)   Temp Readings from Last 3 Encounters:  03/18/18 (!) 97.2 F (36.2 C) (Oral)  02/18/18 (!) 97.2 F (36.2 C) (Oral)  11/02/17 (!) 96.9 F (36.1 C) (Temporal)   BP Readings from Last 3 Encounters:  07/19/18 134/67  03/18/18 (!) 150/81  02/18/18 137/70   Pulse Readings from Last 3 Encounters:  07/19/18 76  03/18/18 91  02/18/18 87     Lab Results  Component Value Date   HGBA1C 5.9 (H) 12/04/2014   Lab Results  Component Value Date   LDLCALC 108 (H) 02/18/2018    CREATININE 0.76 02/18/2018       Chemistry      Component Value Date/Time   NA 141 02/18/2018 1116   K 3.7 02/18/2018 1116   CL 99 02/18/2018 1116   CO2 28 02/18/2018 1116   BUN 10 02/18/2018 1116   CREATININE 0.76 02/18/2018 1116      Component Value Date/Time   CALCIUM 9.0 02/18/2018 1116   ALKPHOS 82 02/18/2018 1116   AST 15 02/18/2018 1116   ALT 22 02/18/2018 1116   BILITOT 0.2 02/18/2018 1116         OBSERVATIONS/ OBJECTIVE:     The patient today is alert and upbeat.  She takes care of her husband who has Parkinson's and she lives in the basement of her daughter's home who is assisting her with all the COVID restrictions.  Her weight is 175.2 and she says this is reduced some but she would like to reduce it more.  Her blood pressure generally runs 120/70 or less and I told her I was pleased with these results.  She will keep walking will stay on the current medicines that she is taking.  She understands to come to the office to get fasting lab work.  She will call my nurse to arrange a safe time to come.  Physical exam deferred due to nature of telephonic visit.  ASSESSMENT & PLAN    Time:   Today, I have spent 21 minutes with the patient via telephone discussing the above including Covid precautions.     Visit Diagnoses: 1. Hypertension -Pressures at home have been good and she will continue with current treatment  2. AAA (abdominal aortic aneurysm) without rupture (HCC) -Dr. Archie Patten will see the patient regularly and can follow-up on this with future ultrasounds as needed.  3. Aortic atherosclerosis ([place]) -Continue aggressive therapeutic lifestyle changes with diet and exercise and with over-the-counter treatment regimens that she is currently using.  She has been intolerant to statins.  4. Degenerative disc disease, lumbar -No particular complaints with her back and she will continue with her walking regimen.  5. Malignant neoplasm of lateral wall of  urinary bladder (HCC) -Just had a visit with Dr. Karsten Ro and she will continue to follow-up with him on a yearly basis  6. Hyperlipidemia LDL goal <70 -Patient is statin intolerant and will continue with over-the-counter treatment regimen and walking and diet to keep this under the best control possible and will follow-up regularly with her cardiologist.  7. Atherosclerosis of native coronary artery, angina presence unspecified, unspecified whether native or transplanted heart -Follow-up with Dr. Percival Spanish yearly and she recently had a visit that was a telephone visit in May of this year.  8. Status post left knee replacement -Doing well with this she will follow-up with her orthopedist as needed and especially because of the recent "bruised hip "if not better in another month.  9. Statin intolerance -Continue to follow-up with Dr. Archie Patten and follow aggressive therapeutic lifestyle changes  10. Status post left hip replacement -Follow-up with Dr. Ihor Gully if not improved  11. Educated About Covid-19 Virus Infection  Patient Instructions  Continue to drink plenty of fluids and stay well-hydrated  Walk regularly being careful not to fall and avoid climbing Follow-up with urology as planned Follow-up with cardiology as planned Follow-up with vascular surgery as planned because of peripheral vascular disease Continue to practice good hand and respiratory hygiene      The above assessment and management plan was discussed with the patient. The patient verbalized understanding of and has agreed to the management plan. Patient is aware to call the clinic if symptoms persist or worsen. Patient is aware when to return to the clinic for a follow-up visit. Patient educated on when it is appropriate to go to the emergency department.    Chipper Herb, MD [address], [place], [place] [address] Ph [phone]   Arrie Senate MD

## 2018-08-19 NOTE — Patient Instructions (Signed)
Continue to drink plenty of fluids and stay well-hydrated Walk regularly being careful not to fall and avoid climbing Follow-up with urology as planned Follow-up with cardiology as planned Follow-up with vascular surgery as planned because of peripheral vascular disease Continue to practice good hand and respiratory hygiene

## 2018-08-19 NOTE — Addendum Note (Signed)
Addended by: Zannie Cove on: 08/19/2018 01:20 PM   Modules accepted: Orders

## 2018-08-26 ENCOUNTER — Ambulatory Visit: Payer: Medicare Other

## 2018-09-01 ENCOUNTER — Other Ambulatory Visit: Payer: Self-pay

## 2018-09-02 ENCOUNTER — Ambulatory Visit (INDEPENDENT_AMBULATORY_CARE_PROVIDER_SITE_OTHER): Payer: Medicare Other

## 2018-09-02 ENCOUNTER — Ambulatory Visit (INDEPENDENT_AMBULATORY_CARE_PROVIDER_SITE_OTHER): Payer: Medicare Other | Admitting: Family

## 2018-09-02 ENCOUNTER — Encounter: Payer: Self-pay | Admitting: Family

## 2018-09-02 VITALS — BP 177/87 | HR 93 | Temp 97.7°F | Ht 63.0 in | Wt 185.0 lb

## 2018-09-02 DIAGNOSIS — M25551 Pain in right hip: Secondary | ICD-10-CM

## 2018-09-02 MED ORDER — METHYLPREDNISOLONE ACETATE 80 MG/ML IJ SUSP
80.0000 mg | Freq: Once | INTRAMUSCULAR | Status: AC
  Administered 2018-09-02: 80 mg via INTRAMUSCULAR

## 2018-09-02 MED ORDER — KETOROLAC TROMETHAMINE 60 MG/2ML IM SOLN
60.0000 mg | Freq: Once | INTRAMUSCULAR | Status: AC
  Administered 2018-09-02: 60 mg via INTRAMUSCULAR

## 2018-09-02 NOTE — Patient Instructions (Signed)
Hip Bursitis    Hip bursitis is inflammation of a fluid-filled sac (bursa) in the hip joint. The bursa protects the bones in the hip joint from rubbing against each other. Hip bursitis can cause mild to moderate pain, and symptoms often come and go over time.  What are the causes?  This condition may be caused by:   Injury to the hip.   Overuse of the muscles that surround the hip joint.   Arthritis or gout.   Diabetes.   Thyroid disease.   Cold weather.   Infection.  In some cases, the cause may not be known.  What are the signs or symptoms?  Symptoms of this condition may include:   Mild or moderate pain in the hip area. Pain may get worse with movement.   Tenderness and swelling of the hip, especially on the outer side of the hip.  Symptoms may come and go. If the bursa becomes infected, you may have the following symptoms:   Fever.   Red skin and a feeling of warmth in the hip area.  How is this diagnosed?  This condition may be diagnosed based on:   A physical exam.   Your medical history.   X-rays.   Removal of fluid from your inflamed bursa for testing (biopsy).  You may be sent to a health care provider who specializes in bone diseases (orthopedist) or a provider who specializes in joint inflammation (rheumatologist).  How is this treated?  This condition is treated by resting, raising (elevating), and applying pressure(compression) to the injured area. In some cases, this may be enough to make your symptoms go away. Treatment may also include:   Crutches.   Antibiotic medicine.   Draining fluid out of the bursa to help relieve swelling.   Injecting medicine that helps to reduce inflammation (cortisone).  Follow these instructions at home:  Medicines   Take over-the-counter and prescription medicines only as told by your health care provider.   Do not drive or operate heavy machinery while taking prescription pain medicine, or as told by your health care provider.   If you were  prescribed an antibiotic, take it as told by your health care provider. Do not stop taking the antibiotic even if you start to feel better.  Activity   Return to your normal activities as told by your health care provider. Ask your health care provider what activities are safe for you.   Rest and protect your hip as much as possible until your pain and swelling get better.  General instructions   Wear compression wraps only as told by your health care provider.   Elevate your hip above the level of your heart as much as you can without pain. To do this, try putting a pillow under your hips while you lie down.   Do not use your hip to support your body weight until your health care provider says that you can. Use crutches as told by your health care provider.   Gently massage and stretch your injured area as often as is comfortable.   Keep all follow-up visits as told by your health care provider. This is important.  How is this prevented?   Exercise regularly, as told by your health care provider.   Warm up and stretch before being active.   Cool down and stretch after being active.   If an activity irritates your hip or causes pain, avoid the activity as much as possible.   Avoid sitting   down for long periods at a time.  Contact a health care provider if:   You have a fever.   You develop new symptoms.   You have difficulty walking or doing everyday activities.   You have pain that gets worse or does not get better with medicine.   You develop red skin or a feeling of warmth in your hip area.  Get help right away if:   You cannot move your hip.   You have severe pain.  This information is not intended to replace advice given to you by your health care provider. Make sure you discuss any questions you have with your health care provider.  Document Released: 08/16/2001 Document Revised: 08/02/2015 Document Reviewed: 09/26/2014  Elsevier Interactive Patient Education  2019 [employer]

## 2018-09-02 NOTE — Progress Notes (Signed)
   Subjective:    Patient ID: Gabriela Smith, female    DOB: Jul 31, 1940, 78 y.o.   MRN: [id number]  Chief Complaint  Patient presents with  . right hip pain   Pt presents to the office today with right hip pain that started in May 2020 after getting out of the bed and feeling a pop. She has a hx of a right hip replacement.  Hip Pain  The incident occurred more than 1 week ago. Injury mechanism: getting out of bed in May 2020 and felt a pop. The pain is present in the right hip. The quality of the pain is described as aching. The pain is at a severity of 8/10. The pain is mild. The pain has been intermittent since onset. Pertinent negatives include no numbness or tingling. She reports no foreign bodies present. The symptoms are aggravated by movement and weight bearing. She has tried acetaminophen for the symptoms. The treatment provided mild relief.      Review of Systems  Neurological: Negative for tingling and numbness.  All other systems reviewed and are negative.      Objective:   Physical Exam Vitals signs reviewed.  Constitutional:      General: She is not in acute distress.    Appearance: She is well-developed.  HENT:     Head: Normocephalic and atraumatic.  Eyes:     Pupils: Pupils are equal, round, and reactive to light.  Neck:     Musculoskeletal: Normal range of motion and neck supple.     Thyroid: No thyromegaly.  Cardiovascular:     Rate and Rhythm: Normal rate and regular rhythm.     Heart sounds: Normal heart sounds. No murmur.  Pulmonary:     Effort: Pulmonary effort is normal. No respiratory distress.     Breath sounds: Normal breath sounds. No wheezing.  Abdominal:     General: Bowel sounds are normal. There is no distension.     Palpations: Abdomen is soft.     Tenderness: There is no abdominal tenderness.  Musculoskeletal:        General: No tenderness.     Comments: Pain in lateral right hip, pain with external and internal rotation   Skin:  General: Skin is warm and dry.  Neurological:     Mental Status: She is alert and oriented to person, place, and time.     Cranial Nerves: No cranial nerve deficit.     Deep Tendon Reflexes: Reflexes are normal and symmetric.  Psychiatric:        Behavior: Behavior normal.        Thought Content: Thought content normal.        Judgment: Judgment normal.      BP (!) 177/87   Pulse 93   Temp 97.7 F (36.5 C) (Oral)   Ht 5\' 3"  (1.6 m)   Wt 185 lb (83.9 kg)   BMI 32.77 kg/m       Assessment & Plan:  [name] comes in today with chief complaint of right hip pain   Diagnosis and orders addressed:  1. Right hip pain Rest Ice ROM exercises discussed Keep Ortho appt  RTO if symptoms worsen or do not improve - DG HIP UNILAT W OR W/O PELVIS 2-3 VIEWS RIGHT; Future - methylPREDNISolone acetate (DEPO-MEDROL) injection 80 mg - ketorolac (TORADOL) injection 60 mg   [name], FNP

## 2018-09-06 DIAGNOSIS — L570 Actinic keratosis: Secondary | ICD-10-CM | POA: Diagnosis not present

## 2018-09-06 DIAGNOSIS — D0471 Carcinoma in situ of skin of right lower limb, including hip: Secondary | ICD-10-CM | POA: Diagnosis not present

## 2018-09-06 DIAGNOSIS — X32XXXD Exposure to sunlight, subsequent encounter: Secondary | ICD-10-CM | POA: Diagnosis not present

## 2018-09-06 DIAGNOSIS — D225 Melanocytic nevi of trunk: Secondary | ICD-10-CM | POA: Diagnosis not present

## 2018-09-06 DIAGNOSIS — Z85828 Personal history of other malignant neoplasm of skin: Secondary | ICD-10-CM | POA: Diagnosis not present

## 2018-09-06 DIAGNOSIS — Z08 Encounter for follow-up examination after completed treatment for malignant neoplasm: Secondary | ICD-10-CM | POA: Diagnosis not present

## 2018-10-18 DIAGNOSIS — I872 Venous insufficiency (chronic) (peripheral): Secondary | ICD-10-CM | POA: Diagnosis not present

## 2018-10-18 DIAGNOSIS — L570 Actinic keratosis: Secondary | ICD-10-CM | POA: Diagnosis not present

## 2018-10-18 DIAGNOSIS — Z08 Encounter for follow-up examination after completed treatment for malignant neoplasm: Secondary | ICD-10-CM | POA: Diagnosis not present

## 2018-10-18 DIAGNOSIS — Z85828 Personal history of other malignant neoplasm of skin: Secondary | ICD-10-CM | POA: Diagnosis not present

## 2018-10-18 DIAGNOSIS — X32XXXD Exposure to sunlight, subsequent encounter: Secondary | ICD-10-CM | POA: Diagnosis not present

## 2018-11-06 ENCOUNTER — Other Ambulatory Visit: Payer: Self-pay | Admitting: Family Medicine

## 2018-11-08 ENCOUNTER — Ambulatory Visit (INDEPENDENT_AMBULATORY_CARE_PROVIDER_SITE_OTHER): Payer: Medicare Other | Admitting: Family Medicine

## 2018-11-08 ENCOUNTER — Other Ambulatory Visit: Payer: Self-pay

## 2018-11-08 DIAGNOSIS — N3000 Acute cystitis without hematuria: Secondary | ICD-10-CM

## 2018-11-08 MED ORDER — PHENAZOPYRIDINE HCL 200 MG PO TABS: 200.0000 mg | ORAL_TABLET | Freq: Three times a day (TID) | ORAL | 0 refills | Status: DC | PRN

## 2018-11-08 MED ORDER — CEPHALEXIN 500 MG PO CAPS: 500.0000 mg | ORAL_CAPSULE | Freq: Two times a day (BID) | ORAL | 0 refills | Status: AC

## 2018-11-08 NOTE — Progress Notes (Signed)
Telephone visit  Subjective: CC: UTI PCP: Chipper Herb, MD AP:[id number] Gabriela Smith is a 78 y.o. female calls for telephone consult today. Patient provides verbal consent for consult held via phone.  Location of patient: home Location of provider: Working remotely from home Others present for call: none  1. Urinary symptoms Patient reports a 3 day h/o dysuria, urinary frequency.  Denies urgency, hematuria, fevers, chills, abdominal pain, nausea, vomiting, back pain, vaginal discharge.  Patient has used AZO for symptoms.  Patient denies a h/o frequent or recurrent UTIs.    ROS: Per HPI  Allergies  Allergen Reactions  . Fish Allergy Hives    Bottom Fish   . Lasix [Furosemide] Other (See Comments)    Edema, elevated BP  . Macrodantin [Nitrofurantoin Macrocrystal] Other (See Comments)    Unknown reaction   . Vicodin [Hydrocodone-Acetaminophen] Other (See Comments)    SEVERE N/V  . Zetia [Ezetimibe] Other (See Comments)  . Codeine Rash  . Latex Itching and Rash    Reports Elastic in underwear, under wire bras, and now surgical cap cause rash and itching.   . Niaspan [Niacin Er] Other (See Comments)    Muscle aches  . Statins Other (See Comments)    Muscle aches/ cramps  . Sulfa Antibiotics Rash   Past Medical History:  Diagnosis Date  . AAA (abdominal aortic aneurysm) [facility])    cardiology aware   . Arthritis    OA lt knee- cortizone inj. q 4 months AND OA ALSO IN BACK. Had knee replaced-issue resolved  . CAD (coronary artery disease) CARDIOLOGIST- [facility]--  VISIT 01-02-10 IN EPIC   1993-- PTCA. Pt describes a near total blockage of apparently the LAD and a 65% stenosis elsewhere.  . Cancer ([place])   . History of bladder cancer followed by dr Karsten Ro   hx  TCC of bladder ,  Ta G3-  first occurence 02-20-2012--  s/p BCG tx's  and  mitomycin C  . Hyperlipidemia 1993  . Hypertension 1993  . Nocturia   . Osteopenia   . Sigmoid diverticulosis     Current Outpatient  Medications:  .  acetaminophen (TYLENOL) 500 MG tablet, Take 2 tablets (1,000 mg total) by mouth every 8 (eight) hours., Disp: 30 tablet, Rfl: 0 .  b complex vitamins tablet, Take 1 tablet by mouth daily. One a day, Disp: , Rfl:  .  cholecalciferol (VITAMIN D) 1000 UNITS tablet, Take 1,000 Units by mouth daily. , Disp: , Rfl:  .  clotrimazole-betamethasone (LOTRISONE) cream, Apply 1 application topically 2 (two) times daily. (Patient taking differently: Apply 1 application topically 2 (two) times daily as needed. ), Disp: 90 g, Rfl: 6 .  Co-Enzyme Q10 200 MG CAPS, Take 1 capsule by mouth daily., Disp: , Rfl:  .  Cyanocobalamin (VITAMIN B12 PO), Take 1 tablet by mouth daily., Disp: , Rfl:  .  Flaxseed, Linseed, (FLAX SEED OIL) 1000 MG CAPS, Take 1 capsule by mouth daily. , Disp: , Rfl:  .  hydrochlorothiazide (MICROZIDE) 12.5 MG capsule, TAKE  (1)  CAPSULE  TWICE DAILY., Disp: 180 capsule, Rfl: 1 .  metoprolol tartrate (LOPRESSOR) 50 MG tablet, TAKE (1) TABLET TWICE A DAY., Disp: 180 tablet, Rfl: 1 .  nitroGLYCERIN (NITROSTAT) 0.4 MG SL tablet, Place 1 tablet (0.4 mg total) under the tongue every 5 (five) minutes as needed for chest pain., Disp: 50 tablet, Rfl: 0 .  OVER THE COUNTER MEDICATION, Tumeric, Disp: , Rfl:  .  traMADol (ULTRAM) 50 MG  tablet, Take 1 tablet (50 mg total) by mouth daily as needed., Disp: 30 tablet, Rfl: 0 No current facility-administered medications for this visit.   Facility-Administered Medications Ordered in Other Visits:  .  DOBUTamine (DOBUTREX) 1,000 mcg/mL in dextrose 5% 250 mL infusion, 20 mcg/kg/min, Intravenous, Continuous, Dorothy Spark, MD, Last Rate: 96.4 mL/hr at 12/24/15 0840, 20 mcg/kg/min at 12/24/15 0840  Assessment/ Plan: 78 y.o. female   1. Acute cystitis without hematuria Clinically consistent with acute urinary tract infection.  Start Keflex twice daily for 7 days.  Pyridium as needed bladder pain.  We discussed red flag signs and symptoms  warranting further evaluation.  If no substantial improvement by Wednesday or Thursday, I would like her to come into the office to drop off a urine sample and/or be evaluated.  She voiced good understanding of the plan.  Push oral fluids.  Follow-up PRN. - phenazopyridine (PYRIDIUM) 200 MG tablet; Take 1 tablet (200 mg total) by mouth 3 (three) times daily as needed for pain.  Dispense: 6 tablet; Refill: 0 - cephALEXin (KEFLEX) 500 MG capsule; Take 1 capsule (500 mg total) by mouth 2 (two) times daily for 7 days.  Dispense: 14 capsule; Refill: 0   Start time: 8:35am End time: 8:39am  Total time spent on patient care (including telephone call/ virtual visit): 10 minutes  [place], [place] [phone]

## 2018-11-08 NOTE — Patient Instructions (Signed)
Urinary Tract Infection, Adult A urinary tract infection (UTI) is an infection of any part of the urinary tract. The urinary tract includes:  The kidneys.  The ureters.  The bladder.  The urethra. These organs make, store, and get rid of pee (urine) in the body. What are the causes? This is caused by germs (bacteria) in your genital area. These germs grow and cause swelling (inflammation) of your urinary tract. What increases the risk? You are more likely to develop this condition if:  You have a small, thin tube (catheter) to drain pee.  You cannot control when you pee or poop (incontinence).  You are female, and: ? You use these methods to prevent pregnancy: ? A medicine that kills sperm (spermicide). ? A device that blocks sperm (diaphragm). ? You have low levels of a female hormone (estrogen). ? You are pregnant.  You have genes that add to your risk.  You are sexually active.  You take antibiotic medicines.  You have trouble peeing because of: ? A prostate that is bigger than normal, if you are female. ? A blockage in the part of your body that drains pee from the bladder (urethra). ? A kidney stone. ? A nerve condition that affects your bladder (neurogenic bladder). ? Not getting enough to drink. ? Not peeing often enough.  You have other conditions, such as: ? Diabetes. ? A weak disease-fighting system (immune system). ? Sickle cell disease. ? Gout. ? Injury of the spine. What are the signs or symptoms? Symptoms of this condition include:  Needing to pee right away (urgently).  Peeing often.  Peeing small amounts often.  Pain or burning when peeing.  Blood in the pee.  Pee that smells bad or not like normal.  Trouble peeing.  Pee that is cloudy.  Fluid coming from the vagina, if you are female.  Pain in the belly or lower back. Other symptoms include:  Throwing up (vomiting).  No urge to eat.  Feeling mixed up (confused).  Being tired  and grouchy (irritable).  A fever.  Watery poop (diarrhea). How is this treated? This condition may be treated with:  Antibiotic medicine.  Other medicines.  Drinking enough water. Follow these instructions at home:  Medicines  Take over-the-counter and prescription medicines only as told by your doctor.  If you were prescribed an antibiotic medicine, take it as told by your doctor. Do not stop taking it even if you start to feel better. General instructions  Make sure you: ? Pee until your bladder is empty. ? Do not hold pee for a long time. ? Empty your bladder after sex. ? Wipe from front to back after pooping if you are a female. Use each tissue one time when you wipe.  Drink enough fluid to keep your pee pale yellow.  Keep all follow-up visits as told by your doctor. This is important. Contact a doctor if:  You do not get better after 1-2 days.  Your symptoms go away and then come back. Get help right away if:  You have very bad back pain.  You have very bad pain in your lower belly.  You have a fever.  You are sick to your stomach (nauseous).  You are throwing up. Summary  A urinary tract infection (UTI) is an infection of any part of the urinary tract.  This condition is caused by germs in your genital area.  There are many risk factors for a UTI. These include having a small, thin   tube to drain pee and not being able to control when you pee or poop.  Treatment includes antibiotic medicines for germs.  Drink enough fluid to keep your pee pale yellow. This information is not intended to replace advice given to you by your health care provider. Make sure you discuss any questions you have with your health care provider. Document Released: 08/13/2007 Document Revised: 02/11/2018 Document Reviewed: 09/03/2017 Elsevier Patient Education  2020 [employer] Inc.  

## 2018-11-11 ENCOUNTER — Telehealth: Payer: Self-pay | Admitting: Family Medicine

## 2018-11-11 NOTE — Telephone Encounter (Signed)
Called pt , pcp given and appt made

## 2018-11-17 ENCOUNTER — Ambulatory Visit (INDEPENDENT_AMBULATORY_CARE_PROVIDER_SITE_OTHER): Payer: Medicare Other

## 2018-11-17 ENCOUNTER — Other Ambulatory Visit: Payer: Self-pay

## 2018-11-17 DIAGNOSIS — Z23 Encounter for immunization: Secondary | ICD-10-CM | POA: Diagnosis not present

## 2018-12-02 ENCOUNTER — Ambulatory Visit (INDEPENDENT_AMBULATORY_CARE_PROVIDER_SITE_OTHER): Payer: Medicare Other | Admitting: *Deleted

## 2018-12-02 ENCOUNTER — Other Ambulatory Visit: Payer: Self-pay

## 2018-12-02 DIAGNOSIS — Z0000 Encounter for general adult medical examination without abnormal findings: Secondary | ICD-10-CM

## 2018-12-02 NOTE — Progress Notes (Signed)
MEDICARE ANNUAL WELLNESS VISIT  12/02/2018  Telephone Visit Disclaimer This Medicare AWV was conducted by telephone due to national recommendations for restrictions regarding the COVID-19 Pandemic (e.g. social distancing).  I verified, using two identifiers, that I am speaking with Gabriela Smith or their authorized healthcare agent. I discussed the limitations, risks, security, and privacy concerns of performing an evaluation and management service by telephone and the potential availability of an in-person appointment in the future. The patient expressed understanding and agreed to proceed.   Subjective:  Gabriela Smith is a 79 y.o. female patient of Rakes, Connye Burkitt, FNP who had a Medicare Annual Wellness Visit today via telephone. Kendra is Retired and lives with their spouse. she has 3 children. she reports that she is socially active and does interact with friends/family regularly. she is moderately physically active and enjoys reading, playing cards and taking care of her husband who has Parkinsons.  Patient Care Team: Baruch Gouty, FNP as PCP - General (Family Medicine) Minus Breeding, MD as PCP - Cardiology (Cardiology) Kathie Rhodes, MD as Consulting Physician (Urology) Katy Apo, MD as Consulting Physician (Ophthalmology)  Advanced Directives 12/02/2018 12/01/2017 12/25/2015 12/19/2015 06/28/2014 03/24/2014 11/22/2012  Does Patient Have a Medical Advance Directive? Yes Yes Yes Yes Yes Yes Patient has advance directive, copy in chart  Type of Advance Directive [place];Living will [place];Living will [place];Living will Living will;Healthcare Power of [place];Living will [place];Living will  Does patient want to make changes to medical advance directive? No - Patient declined No - Patient declined No - Patient declined No - Patient declined No  - Patient declined No - Patient declined -  Copy of [place] in Chart? No - copy requested No - copy requested No - copy requested No - copy requested No - copy requested No - copy requested Copy requested from family  Pre-existing out of facility DNR order (yellow form or pink MOST form) - - - - - - No    Hospital Utilization Over the Past 12 Months: # of hospitalizations or ER visits: 0 # of surgeries: 1  Review of Systems    Patient reports that her overall health is unchanged compared to last year.  History obtained from chart review  Patient Reported Readings (BP, Pulse, CBG, Weight, etc) none  Pain Assessment Pain : No/denies pain     Current Medications & Allergies (verified) Allergies as of 12/02/2018      Reactions   Fish Allergy Hives   Bottom Fish    Lasix [furosemide] Other (See Comments)   Edema, elevated BP   Macrodantin [nitrofurantoin Macrocrystal] Other (See Comments)   Unknown reaction    Vicodin [hydrocodone-acetaminophen] Other (See Comments)   SEVERE N/V   Zetia [ezetimibe] Other (See Comments)   Codeine Rash   Latex Itching, Rash   Reports Elastic in underwear, under wire bras, and now surgical cap cause rash and itching.    Niaspan [niacin Er] Other (See Comments)   Muscle aches   Statins Other (See Comments)   Muscle aches/ cramps   Sulfa Antibiotics Rash      Medication List       Accurate as of December 02, 2018 10:23 AM. If you have any questions, ask your nurse or doctor.        STOP taking these medications   phenazopyridine 200 MG tablet Commonly  known as: Pyridium     TAKE these medications   acetaminophen 500 MG tablet Commonly known as: TYLENOL Take 2 tablets (1,000 mg total) by mouth every 8 (eight) hours.   b complex vitamins tablet Take 1 tablet by mouth daily. One a day   cholecalciferol 1000 units tablet Commonly known as: VITAMIN D Take 1,000 Units by mouth daily.    clotrimazole-betamethasone cream Commonly known as: LOTRISONE Apply 1 application topically 2 (two) times daily. What changed:   when to take this  reasons to take this   Co-Enzyme Q10 200 MG Caps Take 1 capsule by mouth daily.   Flax Seed Oil 1000 MG Caps Take 1 capsule by mouth daily.   hydrochlorothiazide 12.5 MG capsule Commonly known as: MICROZIDE TAKE  (1)  CAPSULE  TWICE DAILY.   Magnesium 400 MG Caps Take by mouth.   metoprolol tartrate 50 MG tablet Commonly known as: LOPRESSOR TAKE (1) TABLET TWICE A DAY.   nitroGLYCERIN 0.4 MG SL tablet Commonly known as: NITROSTAT PLACE 1 TABLET UNDER THE TONGUE AT ONSET OF CHEST PAIN EVERY 5 MINTUES UP TO 3 TIMES AS NEEDED   OVER THE COUNTER MEDICATION Tumeric   traMADol 50 MG tablet Commonly known as: ULTRAM Take 1 tablet (50 mg total) by mouth daily as needed.   VITAMIN B12 PO Take 1 tablet by mouth daily.       History (reviewed): Past Medical History:  Diagnosis Date  . AAA (abdominal aortic aneurysm) [facility])    cardiology aware   . Arthritis    OA lt knee- cortizone inj. q 4 months AND OA ALSO IN BACK. Had knee replaced-issue resolved  . CAD (coronary artery disease) CARDIOLOGIST- [facility]--  VISIT 01-02-10 IN EPIC   1993-- PTCA. Pt describes a near total blockage of apparently the LAD and a 65% stenosis elsewhere.  . Cancer ([place])   . History of bladder cancer followed by dr Karsten Ro   hx  TCC of bladder ,  Ta G3-  first occurence 02-20-2012--  s/p BCG tx's  and  mitomycin C  . Hyperlipidemia 1993  . Hypertension 1993  . Nocturia   . Osteopenia   . Sigmoid diverticulosis    Past Surgical History:  Procedure Laterality Date  . CHOLECYSTECTOMY  2003 (approx)  . COLONOSCOPY  05-31-2003  . CORONARY ANGIOPLASTY  1993   to LAD  . CYSTOSCOPY WITH BIOPSY  05/09/2011   Procedure: CYSTOSCOPY WITH BIOPSY;  Surgeon: Claybon Jabs, MD;  Location: [facility];  Service: Urology;  Laterality: N/A;   WITH BLADDER BIOPSY GYRUS  . TOTAL HIP ARTHROPLASTY Right 12/25/2015   Procedure: RIGHT TOTAL HIP ARTHROPLASTY ANTERIOR APPROACH;  Surgeon: Paralee Cancel, MD;  Location: [facility];  Service: Orthopedics;  Laterality: Right;  . TOTAL KNEE ARTHROPLASTY Left 11/22/2012   Procedure: LEFT TOTAL KNEE ARTHROPLASTY;  Surgeon: Mauri Pole, MD;  Location: [facility];  Service: Orthopedics;  Laterality: Left;  . TRANSURETHRAL RESECTION OF BLADDER TUMOR  03/14/2011   Procedure: TRANSURETHRAL RESECTION OF BLADDER TUMOR (TURBT);  Surgeon: Claybon Jabs, MD;  Location: [facility];  Service: Urology;  Laterality: N/A;  . TRANSURETHRAL RESECTION OF BLADDER TUMOR  05/09/2011   Procedure: TRANSURETHRAL RESECTION OF BLADDER TUMOR (TURBT);  Surgeon: Claybon Jabs, MD;  Location: [facility];  Service: Urology;  Laterality: N/A;  . TRANSURETHRAL RESECTION OF BLADDER TUMOR  03/08/2012   Procedure: TRANSURETHRAL RESECTION OF BLADDER TUMOR (TURBT);  Surgeon: Thana Farr  Karsten Ro, MD;  Location: [facility];  Service: Urology;  Laterality: N/A;  . TRANSURETHRAL RESECTION OF BLADDER TUMOR WITH GYRUS (TURBT-GYRUS) N/A 03/24/2014   Procedure: TRANSURETHRAL RESECTION OF BLADDER TUMOR WITH GYRUS (TURBT-GYRUS);  Surgeon: Claybon Jabs, MD;  Location: [facility];  Service: Urology;  Laterality: N/A;  . UMBILICAL HERNIA REPAIR  2001  (approx)   Family History  Problem Relation Age of Onset  . Coronary artery disease Father 43  . Hyperlipidemia Father   . Heart attack Mother 56  . Hyperlipidemia Mother   . Osteoporosis Sister   . Hyperlipidemia Sister   . Heart attack Daughter 46       CABG x 5  . Heart disease Son 55       Stents x 3   Social History   Socioeconomic History  . Marital status: Married    Spouse name: Not on file  . Number of children: 3  . Years of education: 58  . Highest education level: High school graduate  Occupational History  . Occupation:  retired  Scientific laboratory technician  . Financial resource strain: Not hard at all  . Food insecurity    Worry: Never true    Inability: Never true  . Transportation needs    Medical: No    Non-medical: No  Tobacco Use  . Smoking status: Former Smoker    Packs/day: 0.50    Years: 5.00    Pack years: 2.50    Types: Cigarettes    Quit date: 03/09/1976    Years since quitting: 42.7  . Smokeless tobacco: Never Used  Substance and Sexual Activity  . Alcohol use: Yes    Alcohol/week: 5.0 standard drinks    Types: 5 Glasses of wine per week    Comment: red wine  . Drug use: No  . Sexual activity: Not Currently  Lifestyle  . Physical activity    Days per week: 7 days    Minutes per session: 50 min  . Stress: Not at all  Relationships  . Social connections    Talks on phone: More than three times a week    Gets together: More than three times a week    Attends religious service: More than 4 times per year    Active member of club or organization: No    Attends meetings of clubs or organizations: Never    Relationship status: Married  Other Topics Concern  . Not on file  Social History Narrative   Married with 3 children and 4 grandchildren.  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 with Parkinsons    Activities of Daily Living In your present state of health, do you have any difficulty performing the following activities: 12/02/2018  Hearing? N  Vision? N  Comment gets yearly eye exams  Difficulty concentrating or making decisions? N  Walking or climbing stairs? N  Dressing or bathing? N  Doing errands, shopping? N  Preparing Food and eating ? N  Using the Toilet? N  In the past six months, have you accidently leaked urine? N  Do you have problems with loss of bowel control? N  Managing your Medications? N  Managing your Finances? N  Housekeeping or managing your Housekeeping? N  Some recent data might be hidden    Patient Education/ Literacy How often do you need to have someone help you when  you read instructions, pamphlets, or other written materials from your doctor or pharmacy?: 1 - Never What is the last  grade level you completed in school?: 12th grade  Exercise Current Exercise Habits: Home exercise routine, Type of exercise: walking, Time (Minutes): 45, Frequency (Times/Week): 7, Weekly Exercise (Minutes/Week): 315, Intensity: Mild, Exercise limited by: cardiac condition(s)  Diet Patient reports consuming 2 meals a day and 2 snack(s) a day Patient reports that her primary diet is: Regular Patient reports that she does have regular access to food.   Depression Screen PHQ 2/9 Scores 12/02/2018 09/02/2018 03/18/2018 02/18/2018 12/01/2017 10/14/2017 08/19/2017  PHQ - 2 Score 0 0 0 0 0 0 0     Fall Risk Fall Risk  12/02/2018 09/02/2018 03/18/2018 02/18/2018 12/01/2017  Falls in the past year? 0 0 0 0 No  Number falls in past yr: 0 - - - -  Injury with Fall? 0 - - - -  Follow up Falls prevention discussed - - - -  Comment No throw rugs in the house, adequate lighting in the walkways and grab bars in the bathroom. - - - -     Objective:  Gabriela Smith seemed alert and oriented and she participated appropriately during our telephone visit.  Blood Pressure Weight BMI  BP Readings from Last 3 Encounters:  09/02/18 (!) 177/87  07/19/18 134/67  03/18/18 (!) 150/81   Wt Readings from Last 3 Encounters:  09/02/18 185 lb (83.9 kg)  07/19/18 176 lb (79.8 kg)  03/18/18 189 lb (85.7 kg)   BMI Readings from Last 1 Encounters:  09/02/18 32.77 kg/m    *Unable to obtain current vital signs, weight, and BMI due to telephone visit type  Hearing/Vision  . Dewanda did not seem to have difficulty with hearing/understanding during the telephone conversation . Reports that she has had a formal eye exam by an eye care professional within the past year . Reports that she has not had a formal hearing evaluation within the past year *Unable to fully assess hearing and vision during telephone visit  type  Cognitive Function: 6CIT Screen 12/02/2018  What Year? 0 points  What month? 0 points  What time? 0 points  Count back from 20 0 points  Months in reverse 0 points  Repeat phrase 2 points  Total Score 2   (Normal:0-7, Significant for Dysfunction: >8)  Normal Cognitive Function Screening: Yes   Immunization & Health Maintenance Record Immunization History  Administered Date(s) Administered  . Fluad Quad(high Dose 65+) 11/17/2018  . Influenza, High Dose Seasonal PF 12/07/2015, 12/16/2016, 12/21/2017  . Influenza,inj,Quad PF,6+ Mos 12/21/2012, 01/04/2014, 12/25/2014  . Pneumococcal Conjugate-13 06/28/2014  . Pneumococcal Polysaccharide-23 11/04/2012  . Tdap 10/09/2010  . Zoster 07/15/2006    Health Maintenance  Topic Date Due  . MAMMOGRAM  05/21/2019  . DEXA SCAN  08/20/2019  . TETANUS/TDAP  10/08/2020  . INFLUENZA VACCINE  Completed  . PNA vac Low Risk Adult  Completed       Assessment  This is a routine wellness examination for Gabriela Smith.  Health Maintenance: Due or Overdue There are no preventive care reminders to display for this patient.  Gabriela Smith does not need a referral for Community Assistance: Care Management:   no Social Work:    no Prescription Assistance:  no Nutrition/Diabetes Education:  no   Plan:  Personalized Goals Goals Addressed            This Visit's Progress   . DIET - INCREASE WATER INTAKE       Try to drink 6-8 glasses of water daily.  Personalized Health Maintenance & Screening Recommendations  Advanced directives: has an advanced directive - a copy HAS NOT been provided.  Lung Cancer Screening Recommended: no (Low Dose CT Chest recommended if Age 46-80 years, 30 pack-year currently smoking OR have quit w/in past 15 years) Hepatitis C Screening recommended: no HIV Screening recommended: no  Advanced Directives: Written information was not prepared per patient's request.  Referrals & Orders No orders  of the defined types were placed in this encounter.   Follow-up Plan . Follow-up with Baruch Gouty, FNP as planned . Bring a copy of your Advanced Directive in for our records   I have personally reviewed and noted the following in the patient's chart:   . Medical and social history . Use of alcohol, tobacco or illicit drugs  . Current medications and supplements . Functional ability and status . Nutritional status . Physical activity . Advanced directives . List of other physicians . Hospitalizations, surgeries, and ER visits in previous 12 months . Vitals . Screenings to include cognitive, depression, and falls . Referrals and appointments  In addition, I have reviewed and discussed with Gabriela Smith certain preventive protocols, quality metrics, and best practice recommendations. A written personalized care plan for preventive services as well as general preventive health recommendations is available and can be mailed to the patient at her request.      Milas Hock, LPN  [id number]

## 2018-12-02 NOTE — Patient Instructions (Signed)
Preventive Care 38 Years and Older, Female Preventive care refers to lifestyle choices and visits with your health care provider that can promote health and wellness. This includes:  A yearly physical exam. This is also called an annual well check.  Regular dental and eye exams.  Immunizations.  Screening for certain conditions.  Healthy lifestyle choices, such as diet and exercise. What can I expect for my preventive care visit? Physical exam Your health care provider will check:  Height and weight. These may be used to calculate body mass index (BMI), which is a measurement that tells if you are at a healthy weight.  Heart rate and blood pressure.  Your skin for abnormal spots. Counseling Your health care provider may ask you questions about:  Alcohol, tobacco, and drug use.  Emotional well-being.  Home and relationship well-being.  Sexual activity.  Eating habits.  History of falls.  Memory and ability to understand (cognition).  Work and work Statistician.  Pregnancy and menstrual history. What immunizations do I need?  Influenza (flu) vaccine  This is recommended every year. Tetanus, diphtheria, and pertussis (Tdap) vaccine  You may need a Td booster every 10 years. Varicella (chickenpox) vaccine  You may need this vaccine if you have not already been vaccinated. Zoster (shingles) vaccine  You may need this after age 33. Pneumococcal conjugate (PCV13) vaccine  One dose is recommended after age 33. Pneumococcal polysaccharide (PPSV23) vaccine  One dose is recommended after age 72. Measles, mumps, and rubella (MMR) vaccine  You may need at least one dose of MMR if you were born in 1957 or later. You may also need a second dose. Meningococcal conjugate (MenACWY) vaccine  You may need this if you have certain conditions. Hepatitis A vaccine  You may need this if you have certain conditions or if you travel or work in places where you may be exposed  to hepatitis A. Hepatitis B vaccine  You may need this if you have certain conditions or if you travel or work in places where you may be exposed to hepatitis B. Haemophilus influenzae type b (Hib) vaccine  You may need this if you have certain conditions. You may receive vaccines as individual doses or as more than one vaccine together in one shot (combination vaccines). Talk with your health care provider about the risks and benefits of combination vaccines. What tests do I need? Blood tests  Lipid and cholesterol levels. These may be checked every 5 years, or more frequently depending on your overall health.  Hepatitis C test.  Hepatitis B test. Screening  Lung cancer screening. You may have this screening every year starting at age 39 if you have a 30-pack-year history of smoking and currently smoke or have quit within the past 15 years.  Colorectal cancer screening. All adults should have this screening starting at age 36 and continuing until age 15. Your health care provider may recommend screening at age 23 if you are at increased risk. You will have tests every 1-10 years, depending on your results and the type of screening test.  Diabetes screening. This is done by checking your blood sugar (glucose) after you have not eaten for a while (fasting). You may have this done every 1-3 years.  Mammogram. This may be done every 1-2 years. Talk with your health care provider about how often you should have regular mammograms.  BRCA-related cancer screening. This may be done if you have a family history of breast, ovarian, tubal, or peritoneal cancers.  Other tests  Sexually transmitted disease (STD) testing.  Bone density scan. This is done to screen for osteoporosis. You may have this done starting at age 76. Follow these instructions at home: Eating and drinking  Eat a diet that includes fresh fruits and vegetables, whole grains, lean protein, and low-fat dairy products. Limit  your intake of foods with high amounts of sugar, saturated fats, and salt.  Take vitamin and mineral supplements as recommended by your health care provider.  Do not drink alcohol if your health care provider tells you not to drink.  If you drink alcohol: ? Limit how much you have to 0-1 drink a day. ? Be aware of how much alcohol is in your drink. In the [place] one drink equals one 12 oz bottle of beer (355 mL), one 5 oz glass of wine (148 mL), or one 1 oz glass of hard liquor (44 mL). Lifestyle  Take daily care of your teeth and gums.  Stay active. Exercise for at least 30 minutes on 5 or more days each week.  Do not use any products that contain nicotine or tobacco, such as cigarettes, e-cigarettes, and chewing tobacco. If you need help quitting, ask your health care provider.  If you are sexually active, practice safe sex. Use a condom or other form of protection in order to prevent STIs (sexually transmitted infections).  Talk with your health care provider about taking a low-dose aspirin or statin. What's next?  Go to your health care provider once a year for a well check visit.  Ask your health care provider how often you should have your eyes and teeth checked.  Stay up to date on all vaccines. This information is not intended to replace advice given to you by your health care provider. Make sure you discuss any questions you have with your health care provider. Document Released: 03/23/2015 Document Revised: 02/18/2018 Document Reviewed: 02/18/2018 Elsevier Patient Education  2020 [employer].

## 2019-02-16 ENCOUNTER — Other Ambulatory Visit: Payer: Self-pay

## 2019-02-17 ENCOUNTER — Encounter: Payer: Self-pay | Admitting: Family Medicine

## 2019-02-17 ENCOUNTER — Ambulatory Visit (INDEPENDENT_AMBULATORY_CARE_PROVIDER_SITE_OTHER): Payer: Medicare Other | Admitting: Family Medicine

## 2019-02-17 VITALS — BP 146/77 | HR 83 | Temp 99.0°F | Resp 20 | Ht 63.0 in | Wt 190.0 lb

## 2019-02-17 DIAGNOSIS — I739 Peripheral vascular disease, unspecified: Secondary | ICD-10-CM

## 2019-02-17 DIAGNOSIS — Z6833 Body mass index (BMI) 33.0-33.9, adult: Secondary | ICD-10-CM | POA: Diagnosis not present

## 2019-02-17 DIAGNOSIS — E559 Vitamin D deficiency, unspecified: Secondary | ICD-10-CM

## 2019-02-17 DIAGNOSIS — I700 Atherosclerosis of aorta: Secondary | ICD-10-CM | POA: Diagnosis not present

## 2019-02-17 DIAGNOSIS — I10 Essential (primary) hypertension: Secondary | ICD-10-CM | POA: Diagnosis not present

## 2019-02-17 DIAGNOSIS — L03116 Cellulitis of left lower limb: Secondary | ICD-10-CM

## 2019-02-17 HISTORY — DX: Peripheral vascular disease, unspecified: I73.9

## 2019-02-17 MED ORDER — DOXYCYCLINE HYCLATE 100 MG PO TABS: 100.0000 mg | ORAL_TABLET | Freq: Two times a day (BID) | ORAL | 0 refills | Status: AC

## 2019-02-17 NOTE — Progress Notes (Signed)
Subjective:  Patient ID: Gabriela Smith, female    DOB: Apr 14, 1940, 78 y.o.   MRN: [id number]  Patient Care Team: Baruch Gouty, FNP as PCP - General (Family Medicine) Minus Breeding, MD as PCP - Cardiology (Cardiology) Kathie Rhodes, MD as Consulting Physician (Urology) Katy Apo, MD as Consulting Physician (Ophthalmology)   Chief Complaint:  Medical Management of Chronic Issues (6 mo ( [place] ) ) and Hypertension   HPI: Gabriela Smith is a 78 y.o. female presenting on 02/17/2019 for Medical Management of Chronic Issues (6 mo ( moore ) ) and Hypertension   1. Essential hypertension, benign Complaint with meds - Yes Current Medications - HCTZ, metoprolol Checking BP at home ranging 130/80 Exercising Regularly - No Watching Salt intake - Yes Pertinent ROS:  Headache - No Fatigue - No Visual Disturbances - No Chest pain - No Dyspnea - No Palpitations - No LE edema - Yes They report good compliance with medications and can restate their regimen by memory. No medication side effects.  Family, social, and smoking history reviewed.   BP Readings from Last 3 Encounters:  02/17/19 (!) 146/77  09/02/18 (!) 177/87  07/19/18 134/67   CMP Latest Ref Rng & Units 02/18/2018 08/19/2017 02/11/2017  Glucose 65 - 99 mg/dL 105(H) 104(H) 97  BUN 8 - 27 mg/dL _0 Creatinine 0.57 - 1.00 mg/dL 0.76 0.81 0.81  Sodium 134 - 144 mmol/L 141 140 142  Potassium 3.5 - 5.2 mmol/L 3.7 4.1 4.6  Chloride 96 - 106 mmol/L 99 99 100  CO2 20 - 29 mmol/L _1 Calcium 8.7 - 10.3 mg/dL 9.0 9.6 9.5  Total Protein 6.0 - 8.5 g/dL 6.6 7.1 7.2  Total Bilirubin 0.0 - 1.2 mg/dL 0.2 0.4 0.3  Alkaline Phos 39 - 117 IU/L 82 69 65  AST 0 - 40 IU/L _2 ALT 0 - 32 IU/L _3 2. Aortic atherosclerosis (HCC) Pt is not on statin therapy or ASA due to intolerance. Pt states she does not wish to initiate either at this time. No chest pain, shortness of breath, palpitations, dizziness,  or syncope.   3. Vitamin D deficiency Pt is taking oral repletion therapy. Denies bone pain and tenderness, muscle weakness, fracture, and difficulty walking. Lab Results  Component Value Date   VD25OH 38.9 02/18/2018   VD25OH 42.2 08/19/2017   VD25OH 35.0 02/11/2017   Lab Results  Component Value Date   CALCIUM 9.0 02/18/2018     4. BMI 33.0-33.9,adult Pt does try to watch diet and stay active. She tries to walk several times per week.   5. Intermittent claudication (HCC) Pt reports bilateral lower leg and foot pain with walking. States this pain is aching to throbbing and subsides when she sits to rest. States this does not happen all of the time but is becoming more frequent over the last month. No weakness or loss of function.   6. Left lower leg redness Pt had a cancerous skin lesion removed from her left lower leg 6 weeks ago by Dr. Nevada Crane. Pt reports she developed redness and tenderness around the wound. States the area is tender to touch. No drainage. No fever, chills, weakness, or confusion. Has been using topical creams but has not followed up with dermatology.     Relevant past medical, surgical, family, and social history reviewed and updated as indicated.  Allergies and medications reviewed and updated.  Date reviewed: Chart in Epic.   Past Medical History:  Diagnosis Date  . AAA (abdominal aortic aneurysm) [facility])    cardiology aware   . Arthritis    OA lt knee- cortizone inj. q 4 months AND OA ALSO IN BACK. Had knee replaced-issue resolved  . CAD (coronary artery disease) CARDIOLOGIST- [facility]--  VISIT 01-02-10 IN EPIC   1993-- PTCA. Pt describes a near total blockage of apparently the LAD and a 65% stenosis elsewhere.  . Cancer ([place])   . History of bladder cancer followed by dr Karsten Ro   hx  TCC of bladder ,  Ta G3-  first occurence 02-20-2012--  s/p BCG tx's  and  mitomycin C  . Hyperlipidemia 1993  . Hypertension 1993  . Nocturia   . Osteopenia   . Sigmoid  diverticulosis     Past Surgical History:  Procedure Laterality Date  . CHOLECYSTECTOMY  2003 (approx)  . COLONOSCOPY  05-31-2003  . CORONARY ANGIOPLASTY  1993   to LAD  . CYSTOSCOPY WITH BIOPSY  05/09/2011   Procedure: CYSTOSCOPY WITH BIOPSY;  Surgeon: Claybon Jabs, MD;  Location: [facility];  Service: Urology;  Laterality: N/A;  WITH BLADDER BIOPSY GYRUS  . TOTAL HIP ARTHROPLASTY Right 12/25/2015   Procedure: RIGHT TOTAL HIP ARTHROPLASTY ANTERIOR APPROACH;  Surgeon: Paralee Cancel, MD;  Location: [facility];  Service: Orthopedics;  Laterality: Right;  . TOTAL KNEE ARTHROPLASTY Left 11/22/2012   Procedure: LEFT TOTAL KNEE ARTHROPLASTY;  Surgeon: Mauri Pole, MD;  Location: [facility];  Service: Orthopedics;  Laterality: Left;  . TRANSURETHRAL RESECTION OF BLADDER TUMOR  03/14/2011   Procedure: TRANSURETHRAL RESECTION OF BLADDER TUMOR (TURBT);  Surgeon: Claybon Jabs, MD;  Location: [facility];  Service: Urology;  Laterality: N/A;  . TRANSURETHRAL RESECTION OF BLADDER TUMOR  05/09/2011   Procedure: TRANSURETHRAL RESECTION OF BLADDER TUMOR (TURBT);  Surgeon: Claybon Jabs, MD;  Location: [facility];  Service: Urology;  Laterality: N/A;  . TRANSURETHRAL RESECTION OF BLADDER TUMOR  03/08/2012   Procedure: TRANSURETHRAL RESECTION OF BLADDER TUMOR (TURBT);  Surgeon: Claybon Jabs, MD;  Location: [facility];  Service: Urology;  Laterality: N/A;  . TRANSURETHRAL RESECTION OF BLADDER TUMOR WITH GYRUS (TURBT-GYRUS) N/A 03/24/2014   Procedure: TRANSURETHRAL RESECTION OF BLADDER TUMOR WITH GYRUS (TURBT-GYRUS);  Surgeon: Claybon Jabs, MD;  Location: [facility];  Service: Urology;  Laterality: N/A;  . UMBILICAL HERNIA REPAIR  2001  (approx)    Social History   Socioeconomic History  . Marital status: Married    Spouse name: Not on file  . Number of children: 3  . Years of education: 30  . Highest education level: High school  graduate  Occupational History  . Occupation: retired  Tobacco Use  . Smoking status: Former Smoker    Packs/day: 0.50    Years: 5.00    Pack years: 2.50    Types: Cigarettes    Quit date: 03/09/1976    Years since quitting: 42.9  . Smokeless tobacco: Never Used  Substance and Sexual Activity  . Alcohol use: Yes    Alcohol/week: 5.0 standard drinks    Types: 5 Glasses of wine per week    Comment: red wine  . Drug use: No  . Sexual activity: Not Currently  Other Topics Concern  . Not on file  Social History Narrative   Married with 3 children and 4 grandchildren.  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 with  Parkinsons   Social Determinants of Health   Financial Resource Strain: Low Risk   . Difficulty of Paying Living Expenses: Not hard at all  Food Insecurity: No Food Insecurity  . Worried About Charity fundraiser in the Last Year: Never true  . Ran Out of Food in the Last Year: Never true  Transportation Needs: No Transportation Needs  . Lack of Transportation (Medical): No  . Lack of Transportation (Non-Medical): No  Physical Activity: Sufficiently Active  . Days of Exercise per Week: 7 days  . Minutes of Exercise per Session: 50 min  Stress: No Stress Concern Present  . Feeling of Stress : Not at all  Social Connections: Slightly Isolated  . Frequency of Communication with Friends and Family: More than three times a week  . Frequency of Social Gatherings with Friends and Family: More than three times a week  . Attends Religious Services: More than 4 times per year  . Active Member of Clubs or Organizations: No  . Attends Archivist Meetings: Never  . Marital Status: Married  Human resources officer Violence: Not At Risk  . Fear of Current or Ex-Partner: No  . Emotionally Abused: No  . Physically Abused: No  . Sexually Abused: No    Outpatient Encounter Medications as of 02/17/2019  Medication Sig  . acetaminophen (TYLENOL) 500 MG tablet Take 2 tablets (1,000 mg total) by  mouth every 8 (eight) hours.  [name] b complex vitamins tablet Take 1 tablet by mouth daily. One a day  . cholecalciferol (VITAMIN D) 1000 UNITS tablet Take 1,000 Units by mouth daily.   [name] Co-Enzyme Q10 200 MG CAPS Take 1 capsule by mouth daily.  . Cyanocobalamin (VITAMIN B12 PO) Take 1 tablet by mouth daily.  . Flaxseed, Linseed, (FLAX SEED OIL) 1000 MG CAPS Take 1 capsule by mouth daily.   . hydrochlorothiazide (MICROZIDE) 12.5 MG capsule TAKE  (1)  CAPSULE  TWICE DAILY.  . Magnesium 400 MG CAPS Take by mouth.  . metoprolol tartrate (LOPRESSOR) 50 MG tablet TAKE (1) TABLET TWICE A DAY.  . nitroGLYCERIN (NITROSTAT) 0.4 MG SL tablet PLACE 1 TABLET UNDER THE TONGUE AT ONSET OF CHEST PAIN EVERY 5 MINTUES UP TO 3 TIMES AS NEEDED  . OVER THE COUNTER MEDICATION Tumeric & Elderberry  . doxycycline (VIBRA-TABS) 100 MG tablet Take 1 tablet (100 mg total) by mouth 2 (two) times daily for 10 days. 1 po bid  . [DISCONTINUED] clotrimazole-betamethasone (LOTRISONE) cream Apply 1 application topically 2 (two) times daily. (Patient taking differently: Apply 1 application topically 2 (two) times daily as needed. )  . [DISCONTINUED] traMADol (ULTRAM) 50 MG tablet Take 1 tablet (50 mg total) by mouth daily as needed.   Facility-Administered Encounter Medications as of 02/17/2019  Medication  . DOBUTamine (DOBUTREX) 1,000 mcg/mL in dextrose 5% 250 mL infusion    Allergies  Allergen Reactions  . Fish Allergy Hives    Bottom Fish   . Lasix [Furosemide] Other (See Comments)    Edema, elevated BP  . Macrodantin [Nitrofurantoin Macrocrystal] Other (See Comments)    Unknown reaction   . Vicodin [Hydrocodone-Acetaminophen] Other (See Comments)    SEVERE N/V  . Zetia [Ezetimibe] Other (See Comments)  . Codeine Rash  . Latex Itching and Rash    Reports Elastic in underwear, under wire bras, and now surgical cap cause rash and itching.   . Niaspan [Niacin Er] Other (See Comments)    Muscle aches  . Statins Other  (See  Comments)    Muscle aches/ cramps  . Sulfa Antibiotics Rash    Review of Systems  Constitutional: Negative for activity change, appetite change, chills, diaphoresis, fatigue, fever and unexpected weight change.  HENT: Negative.   Eyes: Negative.  Negative for photophobia and visual disturbance.  Respiratory: Negative for cough, chest tightness and shortness of breath.   Cardiovascular: Negative for chest pain, palpitations and leg swelling.  Gastrointestinal: Negative for abdominal pain, blood in stool, constipation, diarrhea, nausea and vomiting.  Endocrine: Negative.  Negative for cold intolerance, heat intolerance, polydipsia, polyphagia and polyuria.  Genitourinary: Negative for decreased urine volume, difficulty urinating, dysuria, frequency and urgency.  Musculoskeletal: Positive for arthralgias and myalgias.  Skin: Positive for color change and wound.  Allergic/Immunologic: Negative.   Neurological: Negative for dizziness, tremors, seizures, syncope, facial asymmetry, speech difficulty, weakness, light-headedness, numbness and headaches.  Hematological: Negative.   Psychiatric/Behavioral: Negative for confusion, decreased concentration, hallucinations, sleep disturbance and suicidal ideas.  All other systems reviewed and are negative.       Objective:  BP (!) 146/77   Pulse 83   Temp 99 F (37.2 C)   Resp 20   Ht 5' 3" (1.6 m)   Wt 190 lb (86.2 kg)   SpO2 96%   BMI 33.66 kg/m    Wt Readings from Last 3 Encounters:  02/17/19 190 lb (86.2 kg)  09/02/18 185 lb (83.9 kg)  07/19/18 176 lb (79.8 kg)    Physical Exam Vitals and nursing note reviewed.  Constitutional:      General: She is not in acute distress.    Appearance: Normal appearance. She is well-developed and well-groomed. She is obese. She is not ill-appearing, toxic-appearing or diaphoretic.  HENT:     Head: Normocephalic and atraumatic.     Jaw: There is normal jaw occlusion.     Right Ear: Hearing  normal.     Left Ear: Hearing normal.     Nose: Nose normal.     Mouth/Throat:     Lips: Pink.     Mouth: Mucous membranes are moist.     Pharynx: Oropharynx is clear. Uvula midline.  Eyes:     General: Lids are normal.     Extraocular Movements: Extraocular movements intact.     Conjunctiva/sclera: Conjunctivae normal.     Pupils: Pupils are equal, round, and reactive to light.  Neck:     Thyroid: No thyroid mass, thyromegaly or thyroid tenderness.     Vascular: No carotid bruit or JVD.     Trachea: Trachea and phonation normal.  Cardiovascular:     Rate and Rhythm: Normal rate and regular rhythm.     Chest Wall: PMI is not displaced.     Pulses:          Dorsalis pedis pulses are 1+ on the right side and 1+ on the left side.       Posterior tibial pulses are 1+ on the right side and 1+ on the left side.     Heart sounds: Normal heart sounds. No murmur. No friction rub. No gallop.   Pulmonary:     Effort: Pulmonary effort is normal. No respiratory distress.     Breath sounds: Normal breath sounds. No wheezing.  Abdominal:     General: Bowel sounds are normal. There is no distension or abdominal bruit.     Palpations: Abdomen is soft. There is no hepatomegaly or splenomegaly.     Tenderness: There is no abdominal tenderness. There is no  right CVA tenderness or left CVA tenderness.     Hernia: No hernia is present.  Musculoskeletal:        General: Normal range of motion.     Cervical back: Normal range of motion and neck supple.     Right lower leg: No edema.     Left lower leg: No edema.  Lymphadenopathy:     Cervical: No cervical adenopathy.  Skin:    General: Skin is warm and dry.     Capillary Refill: Capillary refill takes less than 2 seconds.     Coloration: Skin is not cyanotic, jaundiced or pale.     Findings: Erythema and wound present. No rash.     Comments: Ruddy discoloration to bilateral anterior lower legs. Surgical wound to left lateral lower leg (image  below) with dark center and surrounding erythema and increased warmth. No drainage. Tender to touch.  Neurological:     General: No focal deficit present.     Mental Status: She is alert and oriented to person, place, and time.     Cranial Nerves: Cranial nerves are intact. No cranial nerve deficit.     Sensory: Sensation is intact. No sensory deficit.     Motor: Motor function is intact. No weakness.     Coordination: Coordination is intact. Coordination normal.     Gait: Gait is intact. Gait normal.     Deep Tendon Reflexes: Reflexes are normal and symmetric. Reflexes normal.  Psychiatric:        Attention and Perception: Attention and perception normal.        Mood and Affect: Mood and affect normal.        Speech: Speech normal.        Behavior: Behavior normal. Behavior is cooperative.        Thought Content: Thought content normal.        Cognition and Memory: Cognition and memory normal.        Judgment: Judgment normal.          Results for orders placed or performed in visit on 03/18/18  Rapid Strep Screen ([facility] ONLY)   Specimen: Other   OTHER  Result Value Ref Range   Strep Gp A Ag, IA W/Reflex Negative Negative  Culture, Group A Strep   OTHER  Result Value Ref Range   Strep A Culture CANCELED        Pertinent labs & imaging results that were available during my care of the patient were reviewed by me and considered in my medical decision making.  Assessment & Plan:  Naika was seen today for medical management of chronic issues and hypertension.  Diagnoses and all orders for this visit:  Essential hypertension, benign BP poorly controlled. Changes were not made in regimen today. Goal BP is 130/80. Pt aware to report any persistent high or low readings. DASH diet and exercise encouraged. Exercise at least 150 minutes per week and increase as tolerated. Goal BMI > 25. Stress management encouraged. Avoid nicotine and tobacco product use. Avoid excessive  alcohol and NSAID's. Avoid more than 2000 mg of sodium daily. Medications as prescribed. Follow up as scheduled.  -     CBC with Differential/Platelet -     CMP14+EGFR -     Thyroid Panel With TSH  Aortic atherosclerosis (HCC) Pt declines ASA and statin therapy due to intolerance. Labs pending.  -     CBC with Differential/Platelet -     CMP14+EGFR -  Lipid panel  Vitamin D deficiency Labs pending. Continue repletion therapy. If indicated, will change repletion dosage. Eat foods rich in Vit D including milk, orange juice, yogurt with vitamin D added, salmon or mackerel, canned tuna fish, cereals with vitamin D added, and cod liver oil. Get out in the sun but make sure to wear at least SPF 30 sunscreen.  -     Vitamin D 25 hydroxy  BMI 33.0-33.9,adult Diet and exercise encouraged. Labs pending.  -     Thyroid Panel With TSH  Intermittent claudication ([place]) Peripheral vascular insufficiency (HCC) Pt has seen Dr. Donnetta Hutching in the past. Will refer back to vascular for ABI and further evaluation.  -     Ambulatory referral to Vascular Surgery  Cellulitis of left lower leg Pt will follow up with dermatology. Will initiate below. Report any new or worsening symptoms.  -     doxycycline (VIBRA-TABS) 100 MG tablet; Take 1 tablet (100 mg total) by mouth 2 (two) times daily for 10 days. 1 po bid    Continue all other maintenance medications.  Follow up plan: Return in about 6 months (around 08/18/2019), or if symptoms worsen or fail to improve.  Continue healthy lifestyle choices, including diet (rich in fruits, vegetables, and lean proteins, and low in salt and simple carbohydrates) and exercise (at least 30 minutes of moderate physical activity daily).  Educational handout given for survey, COVID-19  The above assessment and management plan was discussed with the patient. The patient verbalized understanding of and has agreed to the management plan. Patient is aware to call the clinic if  they develop any new symptoms or if symptoms persist or worsen. Patient is aware when to return to the clinic for a follow-up visit. Patient educated on when it is appropriate to go to the emergency department.   Monia Pouch, FNP-C [facility] [phone]

## 2019-02-17 NOTE — Patient Instructions (Signed)
It was a pleasure seeing you today, Gabriela Smith.  Information regarding what we discussed is included in this packet.  Please make an appointment to see me in 6 months.    If you had labs performed today, you will be contacted with the abnormal results once they are available, usually in the next 3 business days for routine lab work.  If you had STI testing, a pap smear, or a biopsy performed, expect to be contacted in about 7-10 days. If results are normal, you will not be notified.    In a few days you may receive a survey in the mail or online from [employer] regarding your visit with [place] today. Please take a moment to fill this out. Your feedback is very important to our office. It can help [place] better understand your needs as well as improve your experience and satisfaction. Thank you for taking your time to complete it. We care about you.  Because of recent events of COVID-19 ("Coronavirus"), please follow CDC recommendations:   1. Wash your hand frequently 2. Avoid touching your face 3. Stay away from people who are sick 4. If you have symptoms such as fever, cough, shortness of breath then call your healthcare provider for further guidance 5. If you are sick, STAY AT HOME, unless otherwise directed by your healthcare provider. 6. Follow directions from state and national officials regarding staying safe    Please feel free to call our office if any questions or concerns arise.  Warm Regards, Monia Pouch, FNP-C [facility] [address] [place], [place] [address] [phone]

## 2019-02-18 LAB — VITAMIN D 25 HYDROXY (VIT D DEFICIENCY, FRACTURES): Vit D, 25-Hydroxy: 35.9 ng/mL (ref 30.0–100.0)

## 2019-02-18 LAB — CBC WITH DIFFERENTIAL/PLATELET
Basophils Absolute: 0.1 10*3/uL (ref 0.0–0.2)
Basos: 1 %
EOS (ABSOLUTE): 0.1 10*3/uL (ref 0.0–0.4)
Eos: 2 %
Hematocrit: 37.7 % (ref 34.0–46.6)
Hemoglobin: 12.9 g/dL (ref 11.1–15.9)
Immature Grans (Abs): 0 10*3/uL (ref 0.0–0.1)
Immature Granulocytes: 0 %
Lymphocytes Absolute: 2.4 10*3/uL (ref 0.7–3.1)
Lymphs: 38 %
MCH: 31.4 pg (ref 26.6–33.0)
MCHC: 34.2 g/dL (ref 31.5–35.7)
MCV: 92 fL (ref 79–97)
Monocytes Absolute: 0.6 10*3/uL (ref 0.1–0.9)
Monocytes: 9 %
Neutrophils Absolute: 3.2 10*3/uL (ref 1.4–7.0)
Neutrophils: 50 %
Platelets: 271 10*3/uL (ref 150–450)
RBC: 4.11 x10E6/uL (ref 3.77–5.28)
RDW: 12.9 % (ref 11.7–15.4)
WBC: 6.4 10*3/uL (ref 3.4–10.8)

## 2019-02-18 LAB — CMP14+EGFR
ALT: 21 IU/L (ref 0–32)
AST: 23 IU/L (ref 0–40)
Albumin/Globulin Ratio: 1.7 (ref 1.2–2.2)
Albumin: 4.6 g/dL (ref 3.7–4.7)
Alkaline Phosphatase: 75 IU/L (ref 39–117)
BUN/Creatinine Ratio: 20 (ref 12–28)
BUN: 17 mg/dL (ref 8–27)
Bilirubin Total: 0.3 mg/dL (ref 0.0–1.2)
CO2: 22 mmol/L (ref 20–29)
Calcium: 9.5 mg/dL (ref 8.7–10.3)
Chloride: 96 mmol/L (ref 96–106)
Creatinine, Ser: 0.85 mg/dL (ref 0.57–1.00)
GFR calc Af Amer: 75 mL/min/{1.73_m2} (ref >59)
GFR calc non Af Amer: 65 mL/min/{1.73_m2} (ref >59)
Globulin, Total: 2.7 g/dL (ref 1.5–4.5)
Glucose: 116 mg/dL — ABNORMAL HIGH (ref 65–99)
Potassium: 3.9 mmol/L (ref 3.5–5.2)
Sodium: 135 mmol/L (ref 134–144)
Total Protein: 7.3 g/dL (ref 6.0–8.5)

## 2019-02-18 LAB — THYROID PANEL WITH TSH
Free Thyroxine Index: 1.5 (ref 1.2–4.9)
T3 Uptake Ratio: 25 % (ref 24–39)
T4, Total: 5.9 ug/dL (ref 4.5–12.0)
TSH: 4.05 u[IU]/mL (ref 0.450–4.500)

## 2019-02-18 LAB — LIPID PANEL
Chol/HDL Ratio: 4.7 ratio — ABNORMAL HIGH (ref 0.0–4.4)
Cholesterol, Total: 291 mg/dL — ABNORMAL HIGH (ref 100–199)
HDL: 62 mg/dL (ref >39)
LDL Chol Calc (NIH): 185 mg/dL — ABNORMAL HIGH (ref 0–99)
Triglycerides: 231 mg/dL — ABNORMAL HIGH (ref 0–149)
VLDL Cholesterol Cal: 44 mg/dL — ABNORMAL HIGH (ref 5–40)

## 2019-02-18 NOTE — Progress Notes (Signed)
Kidney function is normal.  Liver function is normal.  Fasting sugar is elevated at 116, limit intake of sugary foods and drinks.  Thyroid function normal.  Cholesterol is high, total is 291, triglycerides are 231. LDL is high at 185. Limit intake of fried, greasy, fatty, and fast foods. We will recheck this in 6-9 months  CBC is normal. Vit D is normal.

## 2019-02-25 ENCOUNTER — Telehealth: Payer: Self-pay | Admitting: Family Medicine

## 2019-02-25 ENCOUNTER — Other Ambulatory Visit: Payer: Self-pay | Admitting: Family Medicine

## 2019-02-25 NOTE — Telephone Encounter (Signed)
Patient was just seen on 12/10. She is requesting a refill on her Tramadol. It was last filled on 06-09-18. Advised patient that she may need another office visit but she wanted me to ask since she was just in the office

## 2019-02-25 NOTE — Telephone Encounter (Signed)
Last office visit 02/17/2019

## 2019-02-25 NOTE — Telephone Encounter (Signed)
Controlled substance, so needs to be in person visit to see if it is appropriate to continue.

## 2019-02-25 NOTE — Telephone Encounter (Signed)
Left detailed message on patients voicemail to call the office to make an appointment

## 2019-03-01 ENCOUNTER — Other Ambulatory Visit: Payer: Self-pay | Admitting: Cardiology

## 2019-03-07 ENCOUNTER — Ambulatory Visit (INDEPENDENT_AMBULATORY_CARE_PROVIDER_SITE_OTHER): Payer: Medicare Other | Admitting: Family Medicine

## 2019-03-07 ENCOUNTER — Encounter: Payer: Self-pay | Admitting: Family Medicine

## 2019-03-07 DIAGNOSIS — G6289 Other specified polyneuropathies: Secondary | ICD-10-CM | POA: Insufficient documentation

## 2019-03-07 DIAGNOSIS — G629 Polyneuropathy, unspecified: Secondary | ICD-10-CM | POA: Insufficient documentation

## 2019-03-07 HISTORY — DX: Polyneuropathy, unspecified: G62.9

## 2019-03-07 MED ORDER — GABAPENTIN 100 MG PO CAPS: 100.0000 mg | ORAL_CAPSULE | Freq: Every day | ORAL | 3 refills | Status: DC

## 2019-03-07 NOTE — Progress Notes (Signed)
Virtual Visit via telephone Note Due to COVID-19 pandemic this visit was conducted virtually. This visit type was conducted due to national recommendations for restrictions regarding the COVID-19 Pandemic (e.g. social distancing, sheltering in place) in an effort to limit this patient's exposure and mitigate transmission in our community. All issues noted in this document were discussed and addressed.  A physical exam was not performed with this format.   I connected with [name] on 03/07/2019 at 1310 by telephone and verified that I am speaking with the correct person using two identifiers. [name] is currently located at home and family is currently with them during visit. The provider, Monia Pouch, FNP is located in their office at time of visit.  I discussed the limitations, risks, security and privacy concerns of performing an evaluation and management service by telephone and the availability of in person appointments. I also discussed with the patient that there may be a patient responsible charge related to this service. The patient expressed understanding and agreed to proceed.  Subjective:  Patient ID: Gabriela Smith, female    DOB: [birth year], 78 y.o.   MRN: [id number]  Chief Complaint:  Leg Pain   HPI: Gabriela Smith is a 78 y.o. female presenting on 03/07/2019 for Leg Pain   Leg Pain  The incident occurred more than 1 week ago. There was no injury mechanism. The pain is present in the left leg and right leg. The quality of the pain is described as shooting and burning. The pain is at a severity of 5/10. The pain is moderate. The pain has been intermittent since onset. Associated symptoms include tingling. Pertinent negatives include no inability to bear weight, loss of motion, loss of sensation, muscle weakness or numbness. Nothing aggravates the symptoms. She has tried acetaminophen for the symptoms. The treatment provided no relief.     Relevant past medical,  surgical, family, and social history reviewed and updated as indicated.  Allergies and medications reviewed and updated.   Past Medical History:  Diagnosis Date  . AAA (abdominal aortic aneurysm) [facility])    cardiology aware   . Arthritis    OA lt knee- cortizone inj. q 4 months AND OA ALSO IN BACK. Had knee replaced-issue resolved  . CAD (coronary artery disease) CARDIOLOGIST- [facility]--  VISIT 01-02-10 IN EPIC   1993-- PTCA. Pt describes a near total blockage of apparently the LAD and a 65% stenosis elsewhere.  . Cancer ([place])   . History of bladder cancer followed by dr Karsten Ro   hx  TCC of bladder ,  Ta G3-  first occurence 02-20-2012--  s/p BCG tx's  and  mitomycin C  . Hyperlipidemia 1993  . Hypertension 1993  . Nocturia   . Osteopenia   . Sigmoid diverticulosis     Past Surgical History:  Procedure Laterality Date  . CHOLECYSTECTOMY  2003 (approx)  . COLONOSCOPY  05-31-2003  . CORONARY ANGIOPLASTY  1993   to LAD  . CYSTOSCOPY WITH BIOPSY  05/09/2011   Procedure: CYSTOSCOPY WITH BIOPSY;  Surgeon: Claybon Jabs, MD;  Location: [facility];  Service: Urology;  Laterality: N/A;  WITH BLADDER BIOPSY GYRUS  . TOTAL HIP ARTHROPLASTY Right 12/25/2015   Procedure: RIGHT TOTAL HIP ARTHROPLASTY ANTERIOR APPROACH;  Surgeon: Paralee Cancel, MD;  Location: [facility];  Service: Orthopedics;  Laterality: Right;  . TOTAL KNEE ARTHROPLASTY Left 11/22/2012   Procedure: LEFT TOTAL KNEE ARTHROPLASTY;  Surgeon: Mauri Pole, MD;  Location: [facility];  Service: Orthopedics;  Laterality: Left;  . TRANSURETHRAL RESECTION OF BLADDER TUMOR  03/14/2011   Procedure: TRANSURETHRAL RESECTION OF BLADDER TUMOR (TURBT);  Surgeon: Claybon Jabs, MD;  Location: [facility];  Service: Urology;  Laterality: N/A;  . TRANSURETHRAL RESECTION OF BLADDER TUMOR  05/09/2011   Procedure: TRANSURETHRAL RESECTION OF BLADDER TUMOR (TURBT);  Surgeon: Claybon Jabs, MD;  Location: [facility];  Service: Urology;  Laterality: N/A;  . TRANSURETHRAL RESECTION OF BLADDER TUMOR  03/08/2012   Procedure: TRANSURETHRAL RESECTION OF BLADDER TUMOR (TURBT);  Surgeon: Claybon Jabs, MD;  Location: [facility];  Service: Urology;  Laterality: N/A;  . TRANSURETHRAL RESECTION OF BLADDER TUMOR WITH GYRUS (TURBT-GYRUS) N/A 03/24/2014   Procedure: TRANSURETHRAL RESECTION OF BLADDER TUMOR WITH GYRUS (TURBT-GYRUS);  Surgeon: Claybon Jabs, MD;  Location: [facility];  Service: Urology;  Laterality: N/A;  . UMBILICAL HERNIA REPAIR  2001  (approx)    Social History   Socioeconomic History  . Marital status: Married    Spouse name: Not on file  . Number of children: 3  . Years of education: 30  . Highest education level: High school graduate  Occupational History  . Occupation: retired  Tobacco Use  . Smoking status: Former Smoker    Packs/day: 0.50    Years: 5.00    Pack years: 2.50    Types: Cigarettes    Quit date: 03/09/1976    Years since quitting: 43.0  . Smokeless tobacco: Never Used  Substance and Sexual Activity  . Alcohol use: Yes    Alcohol/week: 5.0 standard drinks    Types: 5 Glasses of wine per week    Comment: red wine  . Drug use: No  . Sexual activity: Not Currently  Other Topics Concern  . Not on file  Social History Narrative   Married with 3 children and 4 grandchildren.  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 with Parkinsons   Social Determinants of Health   Financial Resource Strain: Low Risk   . Difficulty of Paying Living Expenses: Not hard at all  Food Insecurity: No Food Insecurity  . Worried About Charity fundraiser in the Last Year: Never true  . Ran Out of Food in the Last Year: Never true  Transportation Needs: No Transportation Needs  . Lack of Transportation (Medical): No  . Lack of Transportation (Non-Medical): No  Physical Activity: Sufficiently Active  . Days of Exercise per Week: 7 days  . Minutes of Exercise per  Session: 50 min  Stress: No Stress Concern Present  . Feeling of Stress : Not at all  Social Connections: Slightly Isolated  . Frequency of Communication with Friends and Family: More than three times a week  . Frequency of Social Gatherings with Friends and Family: More than three times a week  . Attends Religious Services: More than 4 times per year  . Active Member of Clubs or Organizations: No  . Attends Archivist Meetings: Never  . Marital Status: Married  Human resources officer Violence: Not At Risk  . Fear of Current or Ex-Partner: No  . Emotionally Abused: No  . Physically Abused: No  . Sexually Abused: No    Outpatient Encounter Medications as of 03/07/2019  Medication Sig  . acetaminophen (TYLENOL) 500 MG tablet Take 2 tablets (1,000 mg total) by mouth every 8 (eight) hours.  [name] b complex vitamins tablet Take 1 tablet by mouth daily. One a day  .  cholecalciferol (VITAMIN D) 1000 UNITS tablet Take 1,000 Units by mouth daily.   [name] Co-Enzyme Q10 200 MG CAPS Take 1 capsule by mouth daily.  . Cyanocobalamin (VITAMIN B12 PO) Take 1 tablet by mouth daily.  . Flaxseed, Linseed, (FLAX SEED OIL) 1000 MG CAPS Take 1 capsule by mouth daily.   [name] gabapentin (NEURONTIN) 100 MG capsule Take 1 capsule (100 mg total) by mouth at bedtime.  . hydrochlorothiazide (MICROZIDE) 12.5 MG capsule TAKE (1) CAPSULE TWICE DAILY.  . Magnesium 400 MG CAPS Take by mouth.  . metoprolol tartrate (LOPRESSOR) 50 MG tablet TAKE (1) TABLET TWICE A DAY.  . nitroGLYCERIN (NITROSTAT) 0.4 MG SL tablet PLACE 1 TABLET UNDER THE TONGUE AT ONSET OF CHEST PAIN EVERY 5 MINTUES UP TO 3 TIMES AS NEEDED  . OVER THE COUNTER MEDICATION Tumeric & Elderberry   Facility-Administered Encounter Medications as of 03/07/2019  Medication  . DOBUTamine (DOBUTREX) 1,000 mcg/mL in dextrose 5% 250 mL infusion    Allergies  Allergen Reactions  . Fish Allergy Hives    Bottom Fish   . Lasix [Furosemide] Other (See Comments)     Edema, elevated BP  . Macrodantin [Nitrofurantoin Macrocrystal] Other (See Comments)    Unknown reaction   . Vicodin [Hydrocodone-Acetaminophen] Other (See Comments)    SEVERE N/V  . Zetia [Ezetimibe] Other (See Comments)  . Codeine Rash  . Latex Itching and Rash    Reports Elastic in underwear, under wire bras, and now surgical cap cause rash and itching.   . Niaspan [Niacin Er] Other (See Comments)    Muscle aches  . Statins Other (See Comments)    Muscle aches/ cramps  . Sulfa Antibiotics Rash    Review of Systems  Constitutional: Negative for activity change, appetite change, chills, diaphoresis, fatigue, fever and unexpected weight change.  HENT: Negative.   Eyes: Negative.   Respiratory: Negative for cough, chest tightness and shortness of breath.   Cardiovascular: Negative for chest pain, palpitations and leg swelling.  Gastrointestinal: Negative for blood in stool, constipation, diarrhea, nausea and vomiting.  Endocrine: Negative.   Genitourinary: Negative for decreased urine volume, difficulty urinating, dysuria, frequency and urgency.  Musculoskeletal: Positive for arthralgias and myalgias.  Skin: Negative.   Allergic/Immunologic: Negative.   Neurological: Positive for tingling. Negative for dizziness, numbness and headaches.  Hematological: Negative.   Psychiatric/Behavioral: Negative for confusion, hallucinations, sleep disturbance and suicidal ideas.  All other systems reviewed and are negative.        Observations/Objective: No vital signs or physical exam, this was a telephone or virtual health encounter.  Pt alert and oriented, answers all questions appropriately, and able to speak in full sentences.    Assessment and Plan: Bunnie was seen today for leg pain.  Diagnoses and all orders for this visit:  Disease related peripheral neuropathy Neuropathic pain that interferes with sleep. Will trial below to see if beneficial. Pt aware to report any new,  worsening, or persistent symptoms.  -     gabapentin (NEURONTIN) 100 MG capsule; Take 1 capsule (100 mg total) by mouth at bedtime.     Follow Up Instructions: Return if symptoms worsen or fail to improve.    I discussed the assessment and treatment plan with the patient. The patient was provided an opportunity to ask questions and all were answered. The patient agreed with the plan and demonstrated an understanding of the instructions.   The patient was advised to call back or seek an in-person evaluation if the symptoms  worsen or if the condition fails to improve as anticipated.  The above assessment and management plan was discussed with the patient. The patient verbalized understanding of and has agreed to the management plan. Patient is aware to call the clinic if they develop any new symptoms or if symptoms persist or worsen. Patient is aware when to return to the clinic for a follow-up visit. Patient educated on when it is appropriate to go to the emergency department.    I provided 15 minutes of non-face-to-face time during this encounter. The call started at 1310. The call ended at 1325. The other time was used for coordination of care.    Monia Pouch, FNP-C [facility] [address] [place], [place] [address] [phone] 03/07/2019

## 2019-03-29 ENCOUNTER — Ambulatory Visit: Payer: Medicare Other

## 2019-03-30 ENCOUNTER — Ambulatory Visit: Payer: Medicare Other | Attending: Internal Medicine

## 2019-03-30 DIAGNOSIS — Z23 Encounter for immunization: Secondary | ICD-10-CM | POA: Insufficient documentation

## 2019-03-30 NOTE — Progress Notes (Signed)
   Covid-19 Vaccination Clinic  Name:  Gabriela Smith    MRN: [id number] DOB: 04/24/1940  03/30/2019  Gabriela Smith was observed post Covid-19 immunization for 15 minutes without incidence. She was provided with Vaccine Information Sheet and instruction to access the V-Safe system.   Gabriela Smith was instructed to call 911 with any severe reactions post vaccine: [name] Difficulty breathing  . Swelling of your face and throat  . A fast heartbeat  . A bad rash all over your body  . Dizziness and weakness    Immunizations Administered    Name Date Dose VIS Date Route   Pfizer COVID-19 Vaccine 03/30/2019 12:43 PM 0.3 mL 02/18/2019 Intramuscular   Manufacturer: [place]   Lot: [id number]   [place]: [id number]

## 2019-04-15 ENCOUNTER — Inpatient Hospital Stay (HOSPITAL_COMMUNITY)
Admission: EM | Admit: 2019-04-15 | Discharge: 2019-05-02 | DRG: 233 | Disposition: A | Discharge status: Home / Self Care | Payer: Medicare Other | Attending: Thoracic Surgery (Cardiothoracic Vascular Surgery) | Admitting: Thoracic Surgery (Cardiothoracic Vascular Surgery)

## 2019-04-15 ENCOUNTER — Emergency Department (HOSPITAL_COMMUNITY): Payer: Medicare Other

## 2019-04-15 ENCOUNTER — Other Ambulatory Visit: Payer: Self-pay

## 2019-04-15 ENCOUNTER — Encounter (HOSPITAL_COMMUNITY): Payer: Self-pay | Admitting: *Deleted

## 2019-04-15 DIAGNOSIS — I110 Hypertensive heart disease with heart failure: Secondary | ICD-10-CM | POA: Diagnosis not present

## 2019-04-15 DIAGNOSIS — I6523 Occlusion and stenosis of bilateral carotid arteries: Secondary | ICD-10-CM | POA: Diagnosis not present

## 2019-04-15 DIAGNOSIS — I350 Nonrheumatic aortic (valve) stenosis: Secondary | ICD-10-CM | POA: Diagnosis not present

## 2019-04-15 DIAGNOSIS — I509 Heart failure, unspecified: Secondary | ICD-10-CM

## 2019-04-15 DIAGNOSIS — G629 Polyneuropathy, unspecified: Secondary | ICD-10-CM | POA: Diagnosis present

## 2019-04-15 DIAGNOSIS — Z87891 Personal history of nicotine dependence: Secondary | ICD-10-CM

## 2019-04-15 DIAGNOSIS — Z96652 Presence of left artificial knee joint: Secondary | ICD-10-CM | POA: Diagnosis present

## 2019-04-15 DIAGNOSIS — I25110 Atherosclerotic heart disease of native coronary artery with unstable angina pectoris: Secondary | ICD-10-CM | POA: Diagnosis not present

## 2019-04-15 DIAGNOSIS — I214 Non-ST elevation (NSTEMI) myocardial infarction: Secondary | ICD-10-CM | POA: Diagnosis not present

## 2019-04-15 DIAGNOSIS — R0602 Shortness of breath: Secondary | ICD-10-CM

## 2019-04-15 DIAGNOSIS — Z882 Allergy status to sulfonamides status: Secondary | ICD-10-CM

## 2019-04-15 DIAGNOSIS — Z91040 Latex allergy status: Secondary | ICD-10-CM

## 2019-04-15 DIAGNOSIS — I491 Atrial premature depolarization: Secondary | ICD-10-CM | POA: Diagnosis not present

## 2019-04-15 DIAGNOSIS — E039 Hypothyroidism, unspecified: Secondary | ICD-10-CM | POA: Diagnosis present

## 2019-04-15 DIAGNOSIS — M479 Spondylosis, unspecified: Secondary | ICD-10-CM | POA: Diagnosis present

## 2019-04-15 DIAGNOSIS — Z951 Presence of aortocoronary bypass graft: Secondary | ICD-10-CM | POA: Diagnosis not present

## 2019-04-15 DIAGNOSIS — Z96641 Presence of right artificial hip joint: Secondary | ICD-10-CM | POA: Diagnosis present

## 2019-04-15 DIAGNOSIS — Z6834 Body mass index (BMI) 34.0-34.9, adult: Secondary | ICD-10-CM

## 2019-04-15 DIAGNOSIS — I313 Pericardial effusion (noninflammatory): Secondary | ICD-10-CM | POA: Diagnosis present

## 2019-04-15 DIAGNOSIS — J90 Pleural effusion, not elsewhere classified: Secondary | ICD-10-CM

## 2019-04-15 DIAGNOSIS — D696 Thrombocytopenia, unspecified: Secondary | ICD-10-CM | POA: Diagnosis present

## 2019-04-15 DIAGNOSIS — I4891 Unspecified atrial fibrillation: Secondary | ICD-10-CM | POA: Diagnosis not present

## 2019-04-15 DIAGNOSIS — E669 Obesity, unspecified: Secondary | ICD-10-CM | POA: Diagnosis present

## 2019-04-15 DIAGNOSIS — I083 Combined rheumatic disorders of mitral, aortic and tricuspid valves: Secondary | ICD-10-CM | POA: Diagnosis present

## 2019-04-15 DIAGNOSIS — I459 Conduction disorder, unspecified: Secondary | ICD-10-CM | POA: Diagnosis present

## 2019-04-15 DIAGNOSIS — I10 Essential (primary) hypertension: Secondary | ICD-10-CM | POA: Diagnosis not present

## 2019-04-15 DIAGNOSIS — M1712 Unilateral primary osteoarthritis, left knee: Secondary | ICD-10-CM | POA: Diagnosis present

## 2019-04-15 DIAGNOSIS — I5031 Acute diastolic (congestive) heart failure: Secondary | ICD-10-CM | POA: Diagnosis present

## 2019-04-15 DIAGNOSIS — Z9861 Coronary angioplasty status: Secondary | ICD-10-CM

## 2019-04-15 DIAGNOSIS — D62 Acute posthemorrhagic anemia: Secondary | ICD-10-CM | POA: Diagnosis not present

## 2019-04-15 DIAGNOSIS — Z8349 Family history of other endocrine, nutritional and metabolic diseases: Secondary | ICD-10-CM

## 2019-04-15 DIAGNOSIS — I4819 Other persistent atrial fibrillation: Secondary | ICD-10-CM | POA: Diagnosis not present

## 2019-04-15 DIAGNOSIS — R778 Other specified abnormalities of plasma proteins: Secondary | ICD-10-CM

## 2019-04-15 DIAGNOSIS — Z886 Allergy status to analgesic agent status: Secondary | ICD-10-CM

## 2019-04-15 DIAGNOSIS — Z20822 Contact with and (suspected) exposure to covid-19: Secondary | ICD-10-CM | POA: Diagnosis present

## 2019-04-15 DIAGNOSIS — Z01818 Encounter for other preprocedural examination: Secondary | ICD-10-CM

## 2019-04-15 DIAGNOSIS — Z888 Allergy status to other drugs, medicaments and biological substances status: Secondary | ICD-10-CM

## 2019-04-15 DIAGNOSIS — E876 Hypokalemia: Secondary | ICD-10-CM | POA: Diagnosis not present

## 2019-04-15 DIAGNOSIS — I340 Nonrheumatic mitral (valve) insufficiency: Secondary | ICD-10-CM | POA: Diagnosis not present

## 2019-04-15 DIAGNOSIS — I714 Abdominal aortic aneurysm, without rupture: Secondary | ICD-10-CM | POA: Diagnosis present

## 2019-04-15 DIAGNOSIS — M8580 Other specified disorders of bone density and structure, unspecified site: Secondary | ICD-10-CM | POA: Diagnosis present

## 2019-04-15 DIAGNOSIS — I2510 Atherosclerotic heart disease of native coronary artery without angina pectoris: Secondary | ICD-10-CM | POA: Diagnosis present

## 2019-04-15 DIAGNOSIS — Z881 Allergy status to other antibiotic agents status: Secondary | ICD-10-CM

## 2019-04-15 DIAGNOSIS — J810 Acute pulmonary edema: Secondary | ICD-10-CM

## 2019-04-15 DIAGNOSIS — Z01810 Encounter for preprocedural cardiovascular examination: Secondary | ICD-10-CM | POA: Diagnosis not present

## 2019-04-15 DIAGNOSIS — I480 Paroxysmal atrial fibrillation: Secondary | ICD-10-CM | POA: Diagnosis not present

## 2019-04-15 DIAGNOSIS — R9431 Abnormal electrocardiogram [ECG] [EKG]: Secondary | ICD-10-CM

## 2019-04-15 DIAGNOSIS — Z8551 Personal history of malignant neoplasm of bladder: Secondary | ICD-10-CM

## 2019-04-15 DIAGNOSIS — Z885 Allergy status to narcotic agent status: Secondary | ICD-10-CM

## 2019-04-15 DIAGNOSIS — Z9049 Acquired absence of other specified parts of digestive tract: Secondary | ICD-10-CM

## 2019-04-15 DIAGNOSIS — Z91013 Allergy to seafood: Secondary | ICD-10-CM

## 2019-04-15 DIAGNOSIS — E785 Hyperlipidemia, unspecified: Secondary | ICD-10-CM | POA: Diagnosis not present

## 2019-04-15 DIAGNOSIS — I361 Nonrheumatic tricuspid (valve) insufficiency: Secondary | ICD-10-CM | POA: Diagnosis not present

## 2019-04-15 DIAGNOSIS — Z79899 Other long term (current) drug therapy: Secondary | ICD-10-CM

## 2019-04-15 DIAGNOSIS — R079 Chest pain, unspecified: Secondary | ICD-10-CM | POA: Diagnosis not present

## 2019-04-15 DIAGNOSIS — Z8249 Family history of ischemic heart disease and other diseases of the circulatory system: Secondary | ICD-10-CM

## 2019-04-15 DIAGNOSIS — E782 Mixed hyperlipidemia: Secondary | ICD-10-CM | POA: Diagnosis not present

## 2019-04-15 DIAGNOSIS — I248 Other forms of acute ischemic heart disease: Secondary | ICD-10-CM | POA: Diagnosis not present

## 2019-04-15 DIAGNOSIS — I5021 Acute systolic (congestive) heart failure: Secondary | ICD-10-CM | POA: Diagnosis not present

## 2019-04-15 DIAGNOSIS — R9439 Abnormal result of other cardiovascular function study: Secondary | ICD-10-CM | POA: Diagnosis not present

## 2019-04-15 HISTORY — DX: Heart failure, unspecified: I50.9

## 2019-04-15 LAB — HEMOGLOBIN A1C
Hgb A1c MFr Bld: 5.7 % — ABNORMAL HIGH (ref 4.8–5.6)
Mean Plasma Glucose: 116.89 mg/dL

## 2019-04-15 LAB — CBC WITH DIFFERENTIAL/PLATELET
Abs Immature Granulocytes: 0.03 10*3/uL (ref 0.00–0.07)
Basophils Absolute: 0 10*3/uL (ref 0.0–0.1)
Basophils Relative: 0 %
Eosinophils Absolute: 0 10*3/uL (ref 0.0–0.5)
Eosinophils Relative: 0 %
HCT: 36.8 % (ref 36.0–46.0)
Hemoglobin: 12.1 g/dL (ref 12.0–15.0)
Immature Granulocytes: 0 %
Lymphocytes Relative: 15 %
Lymphs Abs: 1.4 10*3/uL (ref 0.7–4.0)
MCH: 31.6 pg (ref 26.0–34.0)
MCHC: 32.9 g/dL (ref 30.0–36.0)
MCV: 96.1 fL (ref 80.0–100.0)
Monocytes Absolute: 0.6 10*3/uL (ref 0.1–1.0)
Monocytes Relative: 7 %
Neutro Abs: 7 10*3/uL (ref 1.7–7.7)
Neutrophils Relative %: 78 %
Platelets: 265 10*3/uL (ref 150–400)
RBC: 3.83 MIL/uL — ABNORMAL LOW (ref 3.87–5.11)
RDW: 13 % (ref 11.5–15.5)
WBC: 9.1 10*3/uL (ref 4.0–10.5)
nRBC: 0 % (ref 0.0–0.2)

## 2019-04-15 LAB — BRAIN NATRIURETIC PEPTIDE: B Natriuretic Peptide: 841.1 pg/mL — ABNORMAL HIGH (ref 0.0–100.0)

## 2019-04-15 LAB — HEPATIC FUNCTION PANEL
ALT: 45 U/L — ABNORMAL HIGH (ref 0–44)
AST: 30 U/L (ref 15–41)
Albumin: 4 g/dL (ref 3.5–5.0)
Alkaline Phosphatase: 65 U/L (ref 38–126)
Bilirubin, Direct: 0.1 mg/dL (ref 0.0–0.2)
Indirect Bilirubin: 0.9 mg/dL (ref 0.3–0.9)
Total Bilirubin: 1 mg/dL (ref 0.3–1.2)
Total Protein: 7.3 g/dL (ref 6.5–8.1)

## 2019-04-15 LAB — BASIC METABOLIC PANEL
Anion gap: 13 (ref 5–15)
BUN: 11 mg/dL (ref 8–23)
CO2: 24 mmol/L (ref 22–32)
Calcium: 9.2 mg/dL (ref 8.9–10.3)
Chloride: 99 mmol/L (ref 98–111)
Creatinine, Ser: 0.78 mg/dL (ref 0.44–1.00)
GFR calc Af Amer: >60 mL/min (ref >60)
GFR calc non Af Amer: >60 mL/min (ref >60)
Glucose, Bld: 116 mg/dL — ABNORMAL HIGH (ref 70–99)
Potassium: 3.4 mmol/L — ABNORMAL LOW (ref 3.5–5.1)
Sodium: 136 mmol/L (ref 135–145)

## 2019-04-15 LAB — RESPIRATORY PANEL BY RT PCR (FLU A&B, COVID)
Influenza A by PCR: NEGATIVE
Influenza B by PCR: NEGATIVE
SARS Coronavirus 2 by RT PCR: NEGATIVE

## 2019-04-15 LAB — MAGNESIUM: Magnesium: 2 mg/dL (ref 1.7–2.4)

## 2019-04-15 LAB — D-DIMER, QUANTITATIVE: D-Dimer, Quant: 1.24 ug/mL-FEU — ABNORMAL HIGH (ref 0.00–0.50)

## 2019-04-15 LAB — TROPONIN I (HIGH SENSITIVITY)
Troponin I (High Sensitivity): 156 ng/L (ref <18)
Troponin I (High Sensitivity): 167 ng/L (ref <18)

## 2019-04-15 LAB — T4, FREE: Free T4: 0.84 ng/dL (ref 0.61–1.12)

## 2019-04-15 LAB — TSH: TSH: 2.826 u[IU]/mL (ref 0.350–4.500)

## 2019-04-15 MED ORDER — ASPIRIN EC 325 MG PO TBEC
325.0000 mg | DELAYED_RELEASE_TABLET | Freq: Once | ORAL | Status: AC
  Administered 2019-04-15: 325 mg via ORAL
  Filled 2019-04-15: qty 1

## 2019-04-15 MED ORDER — B COMPLEX-C PO TABS
1.0000 | ORAL_TABLET | Freq: Every day | ORAL | Status: DC
  Filled 2019-04-15 (×2): qty 1

## 2019-04-15 MED ORDER — NITROGLYCERIN 0.4 MG SL SUBL: 0.4000 mg | SUBLINGUAL_TABLET | SUBLINGUAL | Status: DC | PRN

## 2019-04-15 MED ORDER — AMLODIPINE BESYLATE 5 MG PO TABS
5.0000 mg | ORAL_TABLET | Freq: Every day | ORAL | Status: DC
  Administered 2019-04-15: 5 mg via ORAL
  Filled 2019-04-15: qty 1

## 2019-04-15 MED ORDER — HEPARIN (PORCINE) 25000 UT/250ML-% IV SOLN
1200.0000 [IU]/h | INTRAVENOUS | Status: DC
  Administered 2019-04-15: 800 [IU]/h via INTRAVENOUS
  Administered 2019-04-16: 1100 [IU]/h via INTRAVENOUS
  Administered 2019-04-17: 1200 [IU]/h via INTRAVENOUS
  Filled 2019-04-15 (×3): qty 250

## 2019-04-15 MED ORDER — POTASSIUM CHLORIDE CRYS ER 20 MEQ PO TBCR
40.0000 meq | EXTENDED_RELEASE_TABLET | Freq: Once | ORAL | Status: AC
  Administered 2019-04-15: 40 meq via ORAL
  Filled 2019-04-15: qty 2

## 2019-04-15 MED ORDER — HYDROCHLOROTHIAZIDE 50 MG PO TABS
50.0000 mg | ORAL_TABLET | Freq: Once | ORAL | Status: AC
  Administered 2019-04-15: 16:00:00 50 mg via ORAL
  Filled 2019-04-15: qty 1
  Filled 2019-04-15: qty 2

## 2019-04-15 MED ORDER — SODIUM CHLORIDE 0.9 % IV SOLN: INTRAVENOUS | Status: DC

## 2019-04-15 MED ORDER — FUROSEMIDE 10 MG/ML IJ SOLN: 40.0000 mg | Freq: Once | INTRAMUSCULAR | Status: DC

## 2019-04-15 MED ORDER — BUMETANIDE 0.25 MG/ML IJ SOLN
1.0000 mg | Freq: Two times a day (BID) | INTRAMUSCULAR | Status: DC
  Administered 2019-04-15: 1 mg via INTRAVENOUS
  Filled 2019-04-15 (×3): qty 4

## 2019-04-15 MED ORDER — ACETAMINOPHEN 325 MG PO TABS
650.0000 mg | ORAL_TABLET | ORAL | Status: DC | PRN
  Administered 2019-04-15 – 2019-04-19 (×2): 650 mg via ORAL
  Filled 2019-04-15 (×2): qty 2

## 2019-04-15 MED ORDER — VITAMIN B-12 1000 MCG PO TABS
500.0000 ug | ORAL_TABLET | Freq: Every day | ORAL | Status: DC
  Administered 2019-04-16 – 2019-05-02 (×16): 500 ug via ORAL
  Filled 2019-04-15 (×5): qty 1
  Filled 2019-04-15: qty 5
  Filled 2019-04-15 (×6): qty 1
  Filled 2019-04-15 (×2): qty 5
  Filled 2019-04-15 (×2): qty 1

## 2019-04-15 MED ORDER — HEPARIN BOLUS VIA INFUSION
2000.0000 [IU] | Freq: Once | INTRAVENOUS | Status: AC
  Administered 2019-04-15: 2000 [IU] via INTRAVENOUS
  Filled 2019-04-15: qty 2000

## 2019-04-15 MED ORDER — METOPROLOL TARTRATE 25 MG PO TABS
25.0000 mg | ORAL_TABLET | Freq: Two times a day (BID) | ORAL | Status: DC
  Administered 2019-04-15 – 2019-04-16 (×3): 25 mg via ORAL
  Filled 2019-04-15 (×3): qty 1

## 2019-04-15 MED ORDER — ONDANSETRON HCL 4 MG/2ML IJ SOLN: 4.0000 mg | Freq: Four times a day (QID) | INTRAMUSCULAR | Status: DC | PRN

## 2019-04-15 MED ORDER — ASPIRIN EC 81 MG PO TBEC
81.0000 mg | DELAYED_RELEASE_TABLET | Freq: Every day | ORAL | Status: DC
  Administered 2019-04-16 – 2019-04-24 (×9): 81 mg via ORAL
  Filled 2019-04-15 (×9): qty 1

## 2019-04-15 MED ORDER — POTASSIUM CHLORIDE CRYS ER 20 MEQ PO TBCR: 40.0000 meq | EXTENDED_RELEASE_TABLET | Freq: Every day | ORAL | Status: DC

## 2019-04-15 MED ORDER — GABAPENTIN 100 MG PO CAPS
100.0000 mg | ORAL_CAPSULE | Freq: Every day | ORAL | Status: DC
  Administered 2019-04-15 – 2019-05-01 (×15): 100 mg via ORAL
  Filled 2019-04-15 (×17): qty 1

## 2019-04-15 MED ORDER — ACETAMINOPHEN 500 MG PO TABS
1000.0000 mg | ORAL_TABLET | Freq: Three times a day (TID) | ORAL | Status: DC
  Administered 2019-04-16 (×2): 1000 mg via ORAL
  Administered 2019-04-17: 500 mg via ORAL
  Administered 2019-04-17: 1000 mg via ORAL
  Filled 2019-04-15 (×5): qty 2

## 2019-04-15 MED ORDER — VITAMIN D 25 MCG (1000 UNIT) PO TABS
1000.0000 [IU] | ORAL_TABLET | Freq: Every day | ORAL | Status: DC
  Administered 2019-04-16 – 2019-05-02 (×16): 1000 [IU] via ORAL
  Filled 2019-04-15 (×16): qty 1

## 2019-04-15 NOTE — Progress Notes (Signed)
ANTICOAGULATION CONSULT NOTE - Initial Consult  Pharmacy Consult for heparin IV Indication: chest pain/ACS  Allergies  Allergen Reactions  . Fish Allergy Hives, Shortness Of Breath and Other (See Comments)    "Bottom-feeder fish"  . Fish-Derived Products Hives, Shortness Of Breath and Other (See Comments)    Bottom-feeder fish  . Shellfish-Derived Products Hives and Shortness Of Breath  . Aspirin Other (See Comments)    Internal bleeding  . Lasix [Furosemide] Swelling and Other (See Comments)    Edema, elevated BP  . Macrodantin [Nitrofurantoin Macrocrystal] Other (See Comments)    Unknown reaction   . Vicodin [Hydrocodone-Acetaminophen] Nausea And Vomiting and Other (See Comments)    SEVERE N/V  . Zetia [Ezetimibe] Other (See Comments)    Reaction not recalled  . Codeine Rash  . Latex Itching and Rash    Reports Elastic in underwear, under wire bras, and now surgical cap cause rash and itching.   . Niacin Other (See Comments)    Muscle aches  . Niaspan [Niacin Er] Other (See Comments)    Muscle aches  . Statins Other (See Comments)    Muscle aches/ cramps  . Sulfa Antibiotics Rash and Other (See Comments)    Muscle aches/ cramps, also    Patient Measurements: Height: 5\' 3"  (160 cm) Weight: 170 lb (77.1 kg) IBW/kg (Calculated) : 52.4 Heparin Dosing Weight: 69 kg  Vital Signs: BP: 160/91 (02/05 1515) Pulse Rate: 110 (02/05 1515)  Labs: Recent Labs    04/15/19 1214 04/15/19 1423  HGB 12.1  --   HCT 36.8  --   PLT 265  --   CREATININE 0.78  --   TROPONINIHS 156* 167*    Estimated Creatinine Clearance: 55.2 mL/min (by C-G formula based on SCr of 0.78 mg/dL).   Medical History: Past Medical History:  Diagnosis Date  . AAA (abdominal aortic aneurysm) [facility])    cardiology aware   . Arthritis    OA lt knee- cortizone inj. q 4 months AND OA ALSO IN BACK. Had knee replaced-issue resolved  . CAD (coronary artery disease) CARDIOLOGIST- DR [facility] ([place])--  VISIT  01-02-10 IN EPIC   1993-- PTCA. Pt describes a near total blockage of apparently the LAD and a 65% stenosis elsewhere.  . Cancer ([place])   . History of bladder cancer followed by dr Karsten Ro   hx  TCC of bladder ,  Ta G3-  first occurence 02-20-2012--  s/p BCG tx's  and  mitomycin C  . Hyperlipidemia 1993  . Hypertension 1993  . Nocturia   . Osteopenia   . Sigmoid diverticulosis     Medications:  (Not in a hospital admission)  Scheduled:  . amLODipine  5 mg Oral Daily  . bumetanide (BUMEX) IV  1 mg Intravenous Q12H  . heparin  2,000 Units Intravenous Once  . potassium chloride  40 mEq Oral Once  . [START ON 04/16/2019] potassium chloride  40 mEq Oral Daily    Assessment: 79 y/o F presenting with shortness of breath. PMH is extensive but significant for h/o bladder cancer, CAD, and abdominal aortic aneurysm from (2017). Pharmacy has been consulted to dose heparin IV infusion for suspected ACS/STEMI.   Hgb and Plt WNL. Troponin elevated and trending up 156>167. Blood pressure elevated at 162/98 and heart rate elevated at 115 bpm. No bleeding concerns noted at this time. Given patient's age and history of abdominal aortic aneurysm, will give 1/2 typical heparin IV bolus to be cautious.   Goal of Therapy:  Heparin level 0.3-0.7 units/ml Monitor platelets by anticoagulation protocol: Yes   Plan:  - Heparin 2000 units IV bolus - Heparin 800 units/hr IV infusion - Check heparin level in 8 hours  - Monitor daily heparin level and CBC - Monitor for s/sx of bleeding  Agnes Lawrence, PharmD PGY1 Pharmacy Resident

## 2019-04-15 NOTE — ED Notes (Signed)
Charge aware of trop 156 will go to next available

## 2019-04-15 NOTE — ED Notes (Signed)
Cardiology at bedside.

## 2019-04-15 NOTE — ED Triage Notes (Signed)
C/o sob onset 4am states she started taking Gabapine  Wed. and has increased sob since. States she went her MD and was told to come to ED for further eval. Denies chest pain

## 2019-04-15 NOTE — ED Provider Notes (Signed)
[facility] EMERGENCY DEPARTMENT Provider Note   CSN: [id number] Arrival date & time: 04/15/19  1129     History Chief Complaint  Patient presents with  . Shortness of Breath    Gabriela Smith is a 79 y.o. female.  Pt presents to the ED today with sob.  She said it started on Wed., the 3rd.  She incidentally started taking Neurontin on the same day and was not sure if sob was from the Neurontin.  She did not have any cp.  The pt has a hx of CAD with a PCI to her LAD.  She is followed with Dr. Percival Spanish for cards.  Pt's last stress test was in 2017.        Past Medical History:  Diagnosis Date  . AAA (abdominal aortic aneurysm) [facility])    cardiology aware   . Arthritis    OA lt knee- cortizone inj. q 4 months AND OA ALSO IN BACK. Had knee replaced-issue resolved  . CAD (coronary artery disease) CARDIOLOGIST- DR [facility]--  VISIT 01-02-10 IN EPIC   1993-- PTCA. Pt describes a near total blockage of apparently the LAD and a 65% stenosis elsewhere.  . Cancer ([place])   . History of bladder cancer followed by dr Karsten Ro   hx  TCC of bladder ,  Ta G3-  first occurence 02-20-2012--  s/p BCG tx's  and  mitomycin C  . Hyperlipidemia 1993  . Hypertension 1993  . Nocturia   . Osteopenia   . Sigmoid diverticulosis     Patient Active Problem List   Diagnosis Date Noted  . Disease related peripheral neuropathy 03/07/2019  . BMI 33.0-33.9,adult 02/17/2019  . Intermittent claudication ([place]) 02/17/2019  . Peripheral vascular insufficiency ([place]) 02/17/2019  . Aortic atherosclerosis ([place]) 02/11/2017  . Atrophic vaginitis 09/18/2016  . S/P right THA, AA 12/25/2015  . S/P hip replacement 12/25/2015  . AAA (abdominal aortic aneurysm) without rupture ([place]) 08/24/2015  . Statin intolerance 05/23/2013  . Osteopenia 04/20/2013  . Degenerative disc disease, lumbar 02/08/2013  . Vitamin D deficiency 02/08/2013  . Overweight (BMI 25.0-29.9) 11/24/2012  . S/P left TKA 11/22/2012   . Malignant neoplasm of urinary bladder ([place]) 11/04/2012  . Osteoarthritis of left knee 11/04/2012  . Hyperlipidemia LDL goal <70 11/28/2008  . Hypertension 11/28/2008  . CORONARY ATHEROSCLEROSIS NATIVE CORONARY ARTERY 11/28/2008    Past Surgical History:  Procedure Laterality Date  . CHOLECYSTECTOMY  2003 (approx)  . COLONOSCOPY  05-31-2003  . CORONARY ANGIOPLASTY  1993   to LAD  . CYSTOSCOPY WITH BIOPSY  05/09/2011   Procedure: CYSTOSCOPY WITH BIOPSY;  Surgeon: Claybon Jabs, MD;  Location: [facility];  Service: Urology;  Laterality: N/A;  WITH BLADDER BIOPSY GYRUS  . TOTAL HIP ARTHROPLASTY Right 12/25/2015   Procedure: RIGHT TOTAL HIP ARTHROPLASTY ANTERIOR APPROACH;  Surgeon: Paralee Cancel, MD;  Location: [facility];  Service: Orthopedics;  Laterality: Right;  . TOTAL KNEE ARTHROPLASTY Left 11/22/2012   Procedure: LEFT TOTAL KNEE ARTHROPLASTY;  Surgeon: Mauri Pole, MD;  Location: [facility];  Service: Orthopedics;  Laterality: Left;  . TRANSURETHRAL RESECTION OF BLADDER TUMOR  03/14/2011   Procedure: TRANSURETHRAL RESECTION OF BLADDER TUMOR (TURBT);  Surgeon: Claybon Jabs, MD;  Location: [facility];  Service: Urology;  Laterality: N/A;  . TRANSURETHRAL RESECTION OF BLADDER TUMOR  05/09/2011   Procedure: TRANSURETHRAL RESECTION OF BLADDER TUMOR (TURBT);  Surgeon: Claybon Jabs, MD;  Location: [facility];  Service: Urology;  Laterality: N/A;  . TRANSURETHRAL RESECTION OF BLADDER TUMOR  03/08/2012   Procedure: TRANSURETHRAL RESECTION OF BLADDER TUMOR (TURBT);  Surgeon: Claybon Jabs, MD;  Location: [facility];  Service: Urology;  Laterality: N/A;  . TRANSURETHRAL RESECTION OF BLADDER TUMOR WITH GYRUS (TURBT-GYRUS) N/A 03/24/2014   Procedure: TRANSURETHRAL RESECTION OF BLADDER TUMOR WITH GYRUS (TURBT-GYRUS);  Surgeon: Claybon Jabs, MD;  Location: [facility];  Service: Urology;  Laterality: N/A;  . UMBILICAL HERNIA  REPAIR  2001  (approx)     OB History   No obstetric history on file.     Family History  Problem Relation Age of Onset  . Coronary artery disease Father 5  . Hyperlipidemia Father   . Heart attack Mother 63  . Hyperlipidemia Mother   . Osteoporosis Sister   . Hyperlipidemia Sister   . Heart attack Daughter 60       CABG x 5  . Heart disease Son 48       Stents x 3    Social History   Tobacco Use  . Smoking status: Former Smoker    Packs/day: 0.50    Years: 5.00    Pack years: 2.50    Types: Cigarettes    Quit date: 03/09/1976    Years since quitting: 43.1  . Smokeless tobacco: Never Used  Substance Use Topics  . Alcohol use: Yes    Alcohol/week: 5.0 standard drinks    Types: 5 Glasses of wine per week    Comment: red wine  . Drug use: No    Home Medications Prior to Admission medications   Medication Sig Start Date End Date Taking? Authorizing Provider  acetaminophen (TYLENOL) 500 MG tablet Take 2 tablets (1,000 mg total) by mouth every 8 (eight) hours. 12/26/15   Danae Orleans, PA-C  b complex vitamins tablet Take 1 tablet by mouth daily. One a day    [provider]  cholecalciferol (VITAMIN D) 1000 UNITS tablet Take 1,000 Units by mouth daily.     [provider]  Co-Enzyme Q10 200 MG CAPS Take 1 capsule by mouth daily.    [provider]  Cyanocobalamin (VITAMIN B12 PO) Take 1 tablet by mouth daily.    [provider]  Flaxseed, Linseed, (FLAX SEED OIL) 1000 MG CAPS Take 1 capsule by mouth daily.     [provider]  gabapentin (NEURONTIN) 100 MG capsule Take 1 capsule (100 mg total) by mouth at bedtime. 03/07/19   Baruch Gouty, FNP  hydrochlorothiazide (MICROZIDE) 12.5 MG capsule TAKE (1) CAPSULE TWICE DAILY. 03/01/19   Minus Breeding, MD  Magnesium 400 MG CAPS Take by mouth.    [provider]  metoprolol tartrate (LOPRESSOR) 50 MG tablet TAKE (1) TABLET TWICE A DAY. 03/01/19   Minus Breeding, MD   nitroGLYCERIN (NITROSTAT) 0.4 MG SL tablet PLACE 1 TABLET UNDER THE TONGUE AT ONSET OF CHEST PAIN EVERY 5 MINTUES UP TO 3 TIMES AS NEEDED 11/08/18   [name], DO  OVER THE COUNTER MEDICATION Tumeric & Elderberry    [provider]    Allergies    Fish allergy, Lasix [furosemide], Macrodantin [nitrofurantoin macrocrystal], Vicodin [hydrocodone-acetaminophen], Zetia [ezetimibe], Codeine, Latex, Niaspan [niacin er], Statins, and Sulfa antibiotics  Review of Systems   Review of Systems  Respiratory: Positive for shortness of breath.   All other systems reviewed and are negative.   Physical Exam Updated Vital Signs BP (!) 160/91   Pulse [name])  110   Resp (!) 23   Ht 5\' 3"  (1.6 m)   Wt 77.1 kg   SpO2 93%   BMI 30.11 kg/m   Physical Exam Vitals and nursing note reviewed.  Constitutional:      Appearance: She is well-developed.  HENT:     Head: Normocephalic and atraumatic.     Mouth/Throat:     Mouth: Mucous membranes are moist.     Pharynx: Oropharynx is clear.  Eyes:     Extraocular Movements: Extraocular movements intact.     Pupils: Pupils are equal, round, and reactive to light.  Cardiovascular:     Rate and Rhythm: Normal rate and regular rhythm.  Pulmonary:     Effort: Tachypnea present.  Abdominal:     General: Bowel sounds are normal.     Palpations: Abdomen is soft.  Musculoskeletal:     Cervical back: Normal range of motion and neck supple.     Right lower leg: Edema present.     Left lower leg: Edema present.  Skin:    General: Skin is warm.     Capillary Refill: Capillary refill takes less than 2 seconds.  Neurological:     General: No focal deficit present.     Mental Status: She is alert and oriented to person, place, and time.  Psychiatric:        Mood and Affect: Mood normal.        Behavior: Behavior normal.     ED Results / Procedures / Treatments   Labs (all labs ordered are listed, but only abnormal results are displayed)  Labs Reviewed  CBC WITH DIFFERENTIAL/PLATELET - Abnormal; Notable for the following components:      Result Value   RBC 3.83 (*)    All other components within normal limits  BASIC METABOLIC PANEL - Abnormal; Notable for the following components:   Potassium 3.4 (*)    Glucose, Bld 116 (*)    All other components within normal limits  TROPONIN I (HIGH SENSITIVITY) - Abnormal; Notable for the following components:   Troponin I (High Sensitivity) 156 (*)    All other components within normal limits  TROPONIN I (HIGH SENSITIVITY) - Abnormal; Notable for the following components:   Troponin I (High Sensitivity) 167 (*)    All other components within normal limits  RESPIRATORY PANEL BY RT PCR (FLU A&B, COVID)    EKG EKG Interpretation  Date/Time:  Friday April 15 2019 14:18:17 EST Ventricular Rate:  111 PR Interval:    QRS Duration: 75 QT Interval:  330 QTC Calculation: 449 R Axis:   69 Text Interpretation: Sinus or ectopic atrial tachycardia Repol abnrm, severe global ischemia (LM/MVD) diffuse st depressions are new Confirmed by Isla Pence [phone]) on 04/15/2019 2:20:48 PM   Radiology DG Chest 2 View  Result Date: 04/15/2019 CLINICAL DATA:  Shortness of breath. EXAM: CHEST - 2 VIEW COMPARISON:  February 11, 2017. FINDINGS: Stable cardiomegaly. No pneumothorax or pleural effusion is noted. Minimal bibasilar interstitial densities are noted concerning for pulmonary edema. Bony thorax is unremarkable. IMPRESSION: Minimal bibasilar interstitial densities are noted concerning for pulmonary edema. Electronically Signed   By: Marijo Conception M.D.   On: 04/15/2019 12:20    Procedures Procedures (including critical care time)  Medications Ordered in ED Medications  nitroGLYCERIN (NITROSTAT) SL tablet 0.4 mg (has no administration in time range)  amLODipine (NORVASC) tablet 5 mg (has no administration in time range)  aspirin EC tablet 325 mg (325 mg  Oral Given 04/15/19 1535)   hydrochlorothiazide (HYDRODIURIL) tablet 50 mg (50 mg Oral Given 04/15/19 1535)    ED Course  I have reviewed the triage vital signs and the nursing notes.  Pertinent labs & imaging results that were available during my care of the patient were reviewed by me and considered in my medical decision making (see chart for details).    MDM Rules/Calculators/A&P                      Lasix is an allergy.  When asked what it does, she said it breaks her out.  She is allergic to sulfa also.  Demadex also has a sulfa group.  So, HCTZ was ordered.  ASA ordered.  No cp, so no nitro given.    Due to EKG, new pulmonary edema, and elevated troponin, cardiology was consulted.  Covid negative.    Final Clinical Impression(s) / ED Diagnoses Final diagnoses:  Acute pulmonary edema (HCC)  Elevated troponin  Abnormal EKG    Rx / DC Orders ED Discharge Orders    None       Isla Pence, MD 04/15/19 1643

## 2019-04-15 NOTE — H&P (Addendum)
Cardiology History and Physical:   Patient ID: Gabriela Smith MRN: [id number]; DOB: [birth year]  Admit date: 04/15/2019 Date of Consult: 04/15/2019  Primary Care Provider: Baruch Gouty, FNP Primary Cardiologist: Minus Breeding, MD  Primary Electrophysiologist:  None   CC:  SOB  Patient Profile:   Gabriela Smith is a 79 y.o. female with a hx of CAD with PTCA in 1993 and neg nuc in 2017, AAA, Bladder cancer, HLD, HTN and arthritis now presents with SOB that began at [place] recently began gabapentin who is being seen today for the evaluation of elevated troponin at the request of Dr. Gilford Raid.  History of Present Illness:   Gabriela Smith with above hx and statin intolerance with thought for PCSK9 -she had been doing well except for pain in her in leg sharp shooting and gabapentin added.   She has been having SOB since yesterday .  Her PCP sent her to ER.    Her SOB began on the 4th same day she began Neurontin, no angina but today increased SOB. Began yesterday,  cannot take in a deep breath.   She did do her grocery shopping yesterday but felt SOB all day associated with chest heaviness ( with PTCA in 1993 it was arm pain)  No nausea or vomiting, no colds or fevers.  No recent trips no swelling or redness in legs.  Not so much lower ext edema feels more full in abd.   EKG:  The EKG was personally reviewed and demonstrates:  SR with PACs and ST depression though similar to 05/13/17  Telemetry:  Telemetry was personally reviewed and demonstrates:  ST with freq PACs - 4-5 beat runs at times.    Na 136, K+ 3.4, Cr 0.78  Troponin 156; 167 WBC 9.1 Hgb 12.1, Plts 265  Negative COVID  2V CXR :   FINDINGS: Stable cardiomegaly. No pneumothorax or pleural effusion is noted. Minimal bibasilar interstitial densities are noted concerning for pulmonary edema. Bony thorax is unremarkable. IMPRESSION: Minimal bibasilar interstitial densities are noted concerning for pulmonary edema.  She is allergic to  lasix, rash and HTN per notes also allergy to sulfa, not rec'd torsemide that she remembers.   She is on HCTZ  Heart Pathway Score:     Past Medical History:  Diagnosis Date  . AAA (abdominal aortic aneurysm) [facility])    cardiology aware   . Arthritis    OA lt knee- cortizone inj. q 4 months AND OA ALSO IN BACK. Had knee replaced-issue resolved  . CAD (coronary artery disease) CARDIOLOGIST- [facility]--  VISIT 01-02-10 IN EPIC   1993-- PTCA. Pt describes a near total blockage of apparently the LAD and a 65% stenosis elsewhere.  . Cancer ([place])   . History of bladder cancer followed by dr Karsten Ro   hx  TCC of bladder ,  Ta G3-  first occurence 02-20-2012--  s/p BCG tx's  and  mitomycin C  . Hyperlipidemia 1993  . Hypertension 1993  . Nocturia   . Osteopenia   . Sigmoid diverticulosis     Past Surgical History:  Procedure Laterality Date  . CHOLECYSTECTOMY  2003 (approx)  . COLONOSCOPY  05-31-2003  . CORONARY ANGIOPLASTY  1993   to LAD  . CYSTOSCOPY WITH BIOPSY  05/09/2011   Procedure: CYSTOSCOPY WITH BIOPSY;  Surgeon: Claybon Jabs, MD;  Location: [facility];  Service: Urology;  Laterality: N/A;  WITH BLADDER BIOPSY GYRUS  . TOTAL HIP ARTHROPLASTY Right 12/25/2015  Procedure: RIGHT TOTAL HIP ARTHROPLASTY ANTERIOR APPROACH;  Surgeon: Paralee Cancel, MD;  Location: [facility];  Service: Orthopedics;  Laterality: Right;  . TOTAL KNEE ARTHROPLASTY Left 11/22/2012   Procedure: LEFT TOTAL KNEE ARTHROPLASTY;  Surgeon: Mauri Pole, MD;  Location: [facility];  Service: Orthopedics;  Laterality: Left;  . TRANSURETHRAL RESECTION OF BLADDER TUMOR  03/14/2011   Procedure: TRANSURETHRAL RESECTION OF BLADDER TUMOR (TURBT);  Surgeon: Claybon Jabs, MD;  [facility];  Service: Urology;  Laterality: N/A;  . TRANSURETHRAL RESECTION OF BLADDER TUMOR  05/09/2011   Procedure: TRANSURETHRAL RESECTION OF BLADDER TUMOR (TURBT);  Surgeon: Claybon Jabs, MD;  Location: [facility];  Service: Urology;  Laterality: N/A;  . TRANSURETHRAL RESECTION OF BLADDER TUMOR  03/08/2012   Procedure: TRANSURETHRAL RESECTION OF BLADDER TUMOR (TURBT);  Surgeon: Claybon Jabs, MD;  Location: [facility];  Service: Urology;  Laterality: N/A;  . TRANSURETHRAL RESECTION OF BLADDER TUMOR WITH GYRUS (TURBT-GYRUS) N/A 03/24/2014   Procedure: TRANSURETHRAL RESECTION OF BLADDER TUMOR WITH GYRUS (TURBT-GYRUS);  Surgeon: Claybon Jabs, MD;  Location: [facility];  Service: Urology;  Laterality: N/A;  . UMBILICAL HERNIA REPAIR  2001  (approx)     Home Medications:  Prior to Admission medications   Medication Sig Start Date End Date Taking? Authorizing Provider  acetaminophen (TYLENOL) 500 MG tablet Take 2 tablets (1,000 mg total) by mouth every 8 (eight) hours. 12/26/15   Danae Orleans, PA-C  b complex vitamins tablet Take 1 tablet by mouth daily. One a day    [provider]  cholecalciferol (VITAMIN D) 1000 UNITS tablet Take 1,000 Units by mouth daily.     [provider]  Co-Enzyme Q10 200 MG CAPS Take 1 capsule by mouth daily.    [provider]  Cyanocobalamin (VITAMIN B12 PO) Take 1 tablet by mouth daily.    [provider]  Flaxseed, Linseed, (FLAX SEED OIL) 1000 MG CAPS Take 1 capsule by mouth daily.     [provider]  gabapentin (NEURONTIN) 100 MG capsule Take 1 capsule (100 mg total) by mouth at bedtime. 03/07/19   Baruch Gouty, FNP  hydrochlorothiazide (MICROZIDE) 12.5 MG capsule TAKE (1) CAPSULE TWICE DAILY. 03/01/19   Minus Breeding, MD  Magnesium 400 MG CAPS Take by mouth.    [provider]  metoprolol tartrate (LOPRESSOR) 50 MG tablet TAKE (1) TABLET TWICE A DAY. 03/01/19   Minus Breeding, MD  nitroGLYCERIN (NITROSTAT) 0.4 MG SL tablet PLACE 1 TABLET UNDER THE TONGUE AT ONSET OF CHEST PAIN EVERY 5 MINTUES UP TO 3 TIMES AS NEEDED 11/08/18   [name], DO  OVER THE  COUNTER MEDICATION Tumeric & Elderberry    [provider]    Inpatient Medications: Scheduled Meds:  Continuous Infusions:  PRN Meds:   Allergies:    Allergies  Allergen Reactions  . Fish Allergy Hives    Bottom Fish   . Lasix [Furosemide] Other (See Comments)    Edema, elevated BP  . Macrodantin [Nitrofurantoin Macrocrystal] Other (See Comments)    Unknown reaction   . Vicodin [Hydrocodone-Acetaminophen] Other (See Comments)    SEVERE N/V  . Zetia [Ezetimibe] Other (See Comments)  . Codeine Rash  . Latex Itching and Rash    Reports Elastic in underwear, under wire bras, and now surgical cap cause rash and itching.   . Niaspan [Niacin Er] Other (See Comments)    Muscle aches  .  Statins Other (See Comments)    Muscle aches/ cramps  . Sulfa Antibiotics Rash    Social History:   Social History   Socioeconomic History  . Marital status: Married    Spouse name: Not on file  . Number of children: 3  . Years of education: 58  . Highest education level: High school graduate  Occupational History  . Occupation: retired  Tobacco Use  . Smoking status: Former Smoker    Packs/day: 0.50    Years: 5.00    Pack years: 2.50    Types: Cigarettes    Quit date: 03/09/1976    Years since quitting: 43.1  . Smokeless tobacco: Never Used  Substance and Sexual Activity  . Alcohol use: Yes    Alcohol/week: 5.0 standard drinks    Types: 5 Glasses of wine per week    Comment: red wine  . Drug use: No  . Sexual activity: Not Currently  Other Topics Concern  . Not on file  Social History Narrative   Married with 3 children and 4 grandchildren.  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 with Parkinsons   Social Determinants of Health   Financial Resource Strain: Low Risk   . Difficulty of Paying Living Expenses: Not hard at all  Food Insecurity: No Food Insecurity  . Worried About Charity fundraiser in the Last Year: Never true  . Ran Out of Food in the Last Year: Never true   Transportation Needs: No Transportation Needs  . Lack of Transportation (Medical): No  . Lack of Transportation (Non-Medical): No  Physical Activity: Sufficiently Active  . Days of Exercise per Week: 7 days  . Minutes of Exercise per Session: 50 min  Stress: No Stress Concern Present  . Feeling of Stress : Not at all  Social Connections: Slightly Isolated  . Frequency of Communication with Friends and Family: More than three times a week  . Frequency of Social Gatherings with Friends and Family: More than three times a week  . Attends Religious Services: More than 4 times per year  . Active Member of Clubs or Organizations: No  . Attends Archivist Meetings: Never  . Marital Status: Married  Human resources officer Violence: Not At Risk  . Fear of Current or Ex-Partner: No  . Emotionally Abused: No  . Physically Abused: No  . Sexually Abused: No    Family History:    Family History  Problem Relation Age of Onset  . Coronary artery disease Father 46  . Hyperlipidemia Father   . Heart attack Mother 70  . Hyperlipidemia Mother   . Osteoporosis Sister   . Hyperlipidemia Sister   . Heart attack Daughter 76       CABG x 5  . Heart disease Son 39       Stents x 3     ROS:  Please see the history of present illness.  General:no colds or fevers, no weight changes Skin:no rashes or ulcers HEENT:no blurred vision, no congestion CV:see HPI PUL:see HPI GI:no diarrhea constipation or melena, no indigestion GU:no hematuria, no dysuria MS:no joint pain, no claudication Neuro:no syncope, no lightheadedness Endo:no diabetes, no thyroid disease  All other ROS reviewed and negative.     Physical Exam/Data:   Vitals:   04/15/19 1430 04/15/19 1445 04/15/19 1500 04/15/19 1515  BP: (!) 157/90 (!) 152/98 (!) 158/97 (!) 160/91  Pulse: (!) 107 (!) 106 (!) 105 (!) 110  Resp: (!) 21 (!) 22 (!) 24 (!) 23  SpO2: 97% 96% 93% 93%  Weight:      Height:       No intake or output data  in the 24 hours ending 04/15/19 1556 Last 3 Weights 04/15/2019 02/17/2019 09/02/2018  Weight (lbs) 170 lb 190 lb 185 lb  Weight (kg) 77.111 kg 86.183 kg 83.915 kg     Body mass index is 30.11 kg/m.  General:  Well nourished, well developed, in no acute distress though still with SOB and chest pressure HEENT: normal Lymph: no adenopathy Neck: no JVD Endocrine:  No thryomegaly Vascular: No carotid bruits; pedal pulses 2+ bilaterally  Cardiac:  normal S1, S2; RRR; no murmur gallup rub or click, though rapid freq Lungs:  Diminished breath sounds to auscultation bilaterally, no wheezing, rhonchi + fine rales  Abd: soft, nontender, no hepatomegaly  Ext: no edema Musculoskeletal:  No deformities, BUE and BLE strength normal and equal Skin: warm and dry  Neuro:  CNs 2-12 intact, no focal abnormalities noted Psych:  Normal affect    Relevant CV Studies: AAA ultrasound 05/2017 The largest aortic measurement is 2.4 cm. The largest  aortic diameter remains essentially unchanged compared to prior exam.  Previous diameter measurement was 2.7 cm   Dobutamine echo 0/2017 Study Conclusions   - Impressions: ECG with ST depression in inferior lateral leads  with doubtamnen and short bursts of atrial tachycardia vs PAF.   Impressions:   - ECG with ST depression in inferior lateral leads with doubtamnen  and short bursts of atrial tachycardia vs PAF.   lexiscan myoview  12/14/15  Study Highlights    The left ventricular ejection fraction is normal (55-65%).  Nuclear stress EF: 61%.  There was no ST segment deviation noted during stress.  Findings consistent with ischemia.  This is an intermediate risk study.   Small area of moderate apical ischemia STS 6 TID 1.34 with visual impression of LV cavity dilatation With lexiscan.  EF normal 61%       Laboratory Data:  High Sensitivity Troponin:   Recent Labs  Lab 04/15/19 1214 04/15/19 1423  TROPONINIHS 156* 167*      Chemistry Recent Labs  Lab 04/15/19 1214  NA 136  K 3.4*  CL 99  CO2 24  GLUCOSE 116*  BUN 11  CREATININE 0.78  CALCIUM 9.2  GFRNONAA >60  GFRAA >60  ANIONGAP 13    No results for input(s): PROT, ALBUMIN, AST, ALT, ALKPHOS, BILITOT in the last 168 hours. Hematology Recent Labs  Lab 04/15/19 1214  WBC 9.1  RBC 3.83*  HGB 12.1  HCT 36.8  MCV 96.1  MCH 31.6  MCHC 32.9  RDW 13.0  PLT 265   BNPNo results for input(s): BNP, PROBNP in the last 168 hours.  DDimer No results for input(s): DDIMER in the last 168 hours.   Radiology/Studies:  DG Chest 2 View  Result Date: 04/15/2019 CLINICAL DATA:  Shortness of breath. EXAM: CHEST - 2 VIEW COMPARISON:  February 11, 2017. FINDINGS: Stable cardiomegaly. No pneumothorax or pleural effusion is noted. Minimal bibasilar interstitial densities are noted concerning for pulmonary edema. Bony thorax is unremarkable. IMPRESSION: Minimal bibasilar interstitial densities are noted concerning for pulmonary edema. Electronically Signed   By: Marijo Conception M.D.   On: 04/15/2019 12:20       HEAR Score (for undifferentiated chest pain):   5    Assessment and Plan:   1. SOB since yesterday, can't take deep breath in, + chest heaviness, mild lateral ST depression  but similar to 2019. Troponin 156 and 167 - CXR with CHF-- is allergic to Lasix will most likely add torsemide will need Echo.  Possible cardiac CTA vs cath   - reassuring her COVID is neg. Possible PE.  She was given HCTZ po 50 mg here in ER .  Talked to pharmacist in ER about IV diuretics, bumex would be an option.   Troponin is flat.  2. CAD with remote PTCA in 1993 has been stable , last nuc 2017 the nuc was mildly abnormal but dobutamine echo was normal.  3. HLD recently LDL 185 and intolerant to statins and zetia, needs PSK9  HDL is 62 she has agreed to take in past.  4. HTN -elevated 157/90 -160/91 may be diastolic HF will add amlodipine 5 mg daily. Along with diuretic. 5. AAA 2.4  X 2.7 2 years ago.  No abd pain.  6. Hx of PACs now freq  7. Recent dx of neuropathy added gabapentin  8. Hypokalemia replace 40 meq now and then 40 daily on Bumex.       Admit to tele, to monitor, check echo and possible cardiac CTA, vs cath    CHF on XRAY  Labs with elevated troponin   For questions or updates, please contact [place] Please consult [email or web address] for contact info under     Signed, Cecilie Kicks, NP  04/15/2019 3:56 PM

## 2019-04-16 ENCOUNTER — Inpatient Hospital Stay (HOSPITAL_COMMUNITY): Payer: Medicare Other

## 2019-04-16 DIAGNOSIS — E876 Hypokalemia: Secondary | ICD-10-CM

## 2019-04-16 DIAGNOSIS — I361 Nonrheumatic tricuspid (valve) insufficiency: Secondary | ICD-10-CM

## 2019-04-16 DIAGNOSIS — I10 Essential (primary) hypertension: Secondary | ICD-10-CM

## 2019-04-16 DIAGNOSIS — I340 Nonrheumatic mitral (valve) insufficiency: Secondary | ICD-10-CM

## 2019-04-16 LAB — BASIC METABOLIC PANEL
Anion gap: 13 (ref 5–15)
BUN: 9 mg/dL (ref 8–23)
CO2: 26 mmol/L (ref 22–32)
Calcium: 9.1 mg/dL (ref 8.9–10.3)
Chloride: 98 mmol/L (ref 98–111)
Creatinine, Ser: 0.79 mg/dL (ref 0.44–1.00)
GFR calc Af Amer: >60 mL/min (ref >60)
GFR calc non Af Amer: >60 mL/min (ref >60)
Glucose, Bld: 111 mg/dL — ABNORMAL HIGH (ref 70–99)
Potassium: 2.8 mmol/L — ABNORMAL LOW (ref 3.5–5.1)
Sodium: 137 mmol/L (ref 135–145)

## 2019-04-16 LAB — CBC
HCT: 35 % — ABNORMAL LOW (ref 36.0–46.0)
Hemoglobin: 11.8 g/dL — ABNORMAL LOW (ref 12.0–15.0)
MCH: 31.7 pg (ref 26.0–34.0)
MCHC: 33.7 g/dL (ref 30.0–36.0)
MCV: 94.1 fL (ref 80.0–100.0)
Platelets: 258 10*3/uL (ref 150–400)
RBC: 3.72 MIL/uL — ABNORMAL LOW (ref 3.87–5.11)
RDW: 13.2 % (ref 11.5–15.5)
WBC: 7.4 10*3/uL (ref 4.0–10.5)
nRBC: 0 % (ref 0.0–0.2)

## 2019-04-16 LAB — LIPID PANEL
Cholesterol: 227 mg/dL — ABNORMAL HIGH (ref 0–200)
HDL: 59 mg/dL (ref >40)
LDL Cholesterol: 142 mg/dL — ABNORMAL HIGH (ref 0–99)
Total CHOL/HDL Ratio: 3.8 RATIO
Triglycerides: 131 mg/dL (ref <150)
VLDL: 26 mg/dL (ref 0–40)

## 2019-04-16 LAB — HEPARIN LEVEL (UNFRACTIONATED)
Heparin Unfractionated: <0.1 IU/mL — ABNORMAL LOW (ref 0.30–0.70)
Heparin Unfractionated: 0.32 IU/mL (ref 0.30–0.70)
Heparin Unfractionated: 0.28 IU/mL — ABNORMAL LOW (ref 0.30–0.70)

## 2019-04-16 LAB — TROPONIN I (HIGH SENSITIVITY)
Troponin I (High Sensitivity): 340 ng/L (ref <18)
Troponin I (High Sensitivity): 440 ng/L (ref <18)
Troponin I (High Sensitivity): 493 ng/L (ref <18)

## 2019-04-16 LAB — ECHOCARDIOGRAM COMPLETE
Height: 63 in
Weight: 2947.2 oz

## 2019-04-16 MED ORDER — HEPARIN BOLUS VIA INFUSION
1000.0000 [IU] | Freq: Once | INTRAVENOUS | Status: AC
  Administered 2019-04-16: 1000 [IU] via INTRAVENOUS
  Filled 2019-04-16: qty 1000

## 2019-04-16 MED ORDER — POTASSIUM CHLORIDE CRYS ER 20 MEQ PO TBCR
40.0000 meq | EXTENDED_RELEASE_TABLET | Freq: Once | ORAL | Status: AC
  Administered 2019-04-16: 40 meq via ORAL
  Filled 2019-04-16: qty 2

## 2019-04-16 MED ORDER — BUMETANIDE 0.25 MG/ML IJ SOLN
1.0000 mg | Freq: Every day | INTRAMUSCULAR | Status: DC
  Administered 2019-04-16: 1 mg via INTRAVENOUS
  Filled 2019-04-16 (×2): qty 4

## 2019-04-16 MED ORDER — B COMPLEX-C PO TABS
1.0000 | ORAL_TABLET | Freq: Every day | ORAL | Status: DC
  Administered 2019-04-16 – 2019-05-02 (×16): 1 via ORAL
  Filled 2019-04-16 (×17): qty 1

## 2019-04-16 MED ORDER — IOHEXOL 350 MG/ML SOLN
80.0000 mL | Freq: Once | INTRAVENOUS | Status: AC | PRN
  Administered 2019-04-16: 80 mL via INTRAVENOUS

## 2019-04-16 MED ORDER — METOPROLOL TARTRATE 25 MG PO TABS
25.0000 mg | ORAL_TABLET | Freq: Four times a day (QID) | ORAL | Status: AC
  Administered 2019-04-17 – 2019-04-18 (×5): 25 mg via ORAL
  Filled 2019-04-16 (×5): qty 1

## 2019-04-16 MED ORDER — POTASSIUM CHLORIDE CRYS ER 20 MEQ PO TBCR
40.0000 meq | EXTENDED_RELEASE_TABLET | Freq: Two times a day (BID) | ORAL | Status: DC
  Administered 2019-04-16 (×2): 40 meq via ORAL
  Filled 2019-04-16 (×2): qty 2

## 2019-04-16 MED ORDER — METOPROLOL TARTRATE 5 MG/5ML IV SOLN
INTRAVENOUS | Status: AC
  Administered 2019-04-16: 2.5 mg via INTRAVENOUS
  Filled 2019-04-16: qty 5

## 2019-04-16 MED ORDER — METOPROLOL TARTRATE 5 MG/5ML IV SOLN: 2.5000 mg | Freq: Once | INTRAVENOUS | Status: AC

## 2019-04-16 NOTE — Progress Notes (Signed)
CCMD notified this nurse that pt was in afib with rate of 110-180's. Asymptomatic. Cardiology on call notified. No new orders given at this time. Stated she will be up to see pt.

## 2019-04-16 NOTE — Progress Notes (Signed)
  Echocardiogram 2D Echocardiogram has been performed.  Gabriela Smith 04/16/2019, 1:20 PM

## 2019-04-16 NOTE — Plan of Care (Signed)
  Problem: Education: Goal: Ability to demonstrate management of disease process will improve Outcome: Progressing Goal: Ability to verbalize understanding of medication therapies will improve Outcome: Progressing   Problem: Activity: Goal: Capacity to carry out activities will improve Outcome: Progressing   

## 2019-04-16 NOTE — Progress Notes (Addendum)
Progress Note  Patient Name: Gabriela Smith Date of Encounter: 04/16/2019  Primary Cardiologist: Minus Breeding, MD   Subjective   Net negative 1.5L yesterday on bumex 1 mg x1.  Stable renal function.  BP stable overnight (101/61 this morning).  Reports significant improvement, feels breathing is markedly improved  Inpatient Medications    Scheduled Meds: . acetaminophen  1,000 mg Oral Q8H  . amLODipine  5 mg Oral Daily  . aspirin EC  81 mg Oral Daily  . B-complex with vitamin C  1 tablet Oral Daily  . bumetanide (BUMEX) IV  1 mg Intravenous Q12H  . cholecalciferol  1,000 Units Oral Daily  . gabapentin  100 mg Oral QHS  . metoprolol tartrate  25 mg Oral BID  . potassium chloride  40 mEq Oral BID  . vitamin B-12  500 mcg Oral Daily   Continuous Infusions: . sodium chloride    . heparin 1,100 Units/hr (04/16/19 0226)   PRN Meds: acetaminophen, nitroGLYCERIN, ondansetron (ZOFRAN) IV   Vital Signs    Vitals:   04/15/19 1944 04/15/19 2041 04/16/19 0047 04/16/19 0602  BP:  140/87 116/70 101/61  Pulse:  (!) 107 75 70  Resp: 18 18 20 18   Temp:  98.2 F (36.8 C) 98.1 F (36.7 C) 97.7 F (36.5 C)  TempSrc:  Oral Oral Oral  SpO2:  98% 95% 97%  Weight:    83.6 kg  Height:        Intake/Output Summary (Last 24 hours) at 04/16/2019 0810 Last data filed at 04/16/2019 [id number] Gross per 24 hour  Intake 240 ml  Output 1750 ml  Net -1510 ml   Last 3 Weights 04/16/2019 04/15/2019 04/15/2019  Weight (lbs) 184 lb 3.2 oz 187 lb 3.2 oz 170 lb  Weight (kg) 83.553 kg 84.913 kg 77.111 kg      Telemetry    Sinus rhythm 70-90s, PACs - Personally Reviewed  ECG    EKG 2/6: sinus rhytm with PACs, QTC 547, TWI in precordial leads - Personally Reviewed  Physical Exam   GEN: No acute distress.   Neck: No JVD Cardiac: irregular rhtyhm, normal rate, no murmurs Respiratory: Clear to auscultation bilaterally. GI: Soft, nontender, non-distended  MS: trace edema Neuro:  Nonfocal  Psych:  Normal affect   Labs    High Sensitivity Troponin:   Recent Labs  Lab 04/15/19 1214 04/15/19 1423 04/15/19 2313 04/16/19 0057  TROPONINIHS 156* 167* 340* 493*      Chemistry Recent Labs  Lab 04/15/19 1214 04/15/19 1822 04/16/19 0508  NA 136  --  137  K 3.4*  --  2.8*  CL 99  --  98  CO2 24  --  26  GLUCOSE 116*  --  111*  BUN 11  --  9  CREATININE 0.78  --  0.79  CALCIUM 9.2  --  9.1  PROT  --  7.3  --   ALBUMIN  --  4.0  --   AST  --  30  --   ALT  --  45*  --   ALKPHOS  --  65  --   BILITOT  --  1.0  --   GFRNONAA >60  --  >60  GFRAA >60  --  >60  ANIONGAP 13  --  13     Hematology Recent Labs  Lab 04/15/19 1214 04/16/19 0508  WBC 9.1 7.4  RBC 3.83* 3.72*  HGB 12.1 11.8*  HCT 36.8 35.0*  MCV 96.1  94.1  MCH 31.6 31.7  MCHC 32.9 33.7  RDW 13.0 13.2  PLT 265 258    BNP Recent Labs  Lab 04/15/19 1822  BNP 841.1*     DDimer  Recent Labs  Lab 04/15/19 1822  DDIMER 1.24*     Radiology    DG Chest 2 View  Result Date: 04/15/2019 CLINICAL DATA:  Shortness of breath. EXAM: CHEST - 2 VIEW COMPARISON:  February 11, 2017. FINDINGS: Stable cardiomegaly. No pneumothorax or pleural effusion is noted. Minimal bibasilar interstitial densities are noted concerning for pulmonary edema. Bony thorax is unremarkable. IMPRESSION: Minimal bibasilar interstitial densities are noted concerning for pulmonary edema. Electronically Signed   By: Marijo Conception M.D.   On: 04/15/2019 12:20    Cardiac Studies     Patient Profile     79 y.o. female with a hx of CAD with PTCA in 1993 and neg nuc in 2017, AAA, Bladder cancer, HLD, HTN and arthritis now presents with SOB, weight gain.  No chest pain.  Troponin 156->167->340->493.  ECG sinus with ectopy.  BNP 841.  Assessment & Plan    Shortness of breath: concerning for new heart failure, CXR with pulmonary edema, BNP 841. Responded well to IV bumex, appears more euvolemic today -TTE -D-dimer positive. CTPA to rule  out PE. -reports lasix intolerance. Using bumex per pharmacy recommendations.  Net negative 1.5L on bumex 1 mg x1, will continue  Elevated troponin: remote history of PCI. 156->167->340->493 -check TTE. If new WMA< will plan for cath  -continue ASA 81  mg -continue heparin  PACs: TSH normal -on metoprolol 25 mg BID  CAD: s/p remote PCI.  Continue ASA, heparin as above  HLD: LDL 185, intolerant to statins and zetia.  Needs PCSK9  Hypokalemia: 2.8 this AM, repleting.   Hypertension: amlodpine 5 mg daily and metoprolol 25 mg BID.  BP soft this AM, will hold amlodipine.  For questions or updates, please contact [place] Please consult [email or web address] for contact info under      Signed, Donato Heinz, MD  04/16/2019, 8:10 AM

## 2019-04-16 NOTE — Progress Notes (Signed)
ANTICOAGULATION CONSULT NOTE - Initial Consult  Pharmacy Consult for heparin IV Indication: chest pain/ACS  Allergies  Allergen Reactions  . Fish Allergy Hives, Shortness Of Breath and Other (See Comments)    "Bottom-feeder fish"  . Fish-Derived Products Hives, Shortness Of Breath and Other (See Comments)    Bottom-feeder fish  . Shellfish-Derived Products Hives and Shortness Of Breath  . Aspirin Other (See Comments)    Internal bleeding  . Lasix [Furosemide] Swelling and Other (See Comments)    Edema, elevated BP  . Macrodantin [Nitrofurantoin Macrocrystal] Other (See Comments)    Unknown reaction   . Vicodin [Hydrocodone-Acetaminophen] Nausea And Vomiting and Other (See Comments)    SEVERE N/V  . Zetia [Ezetimibe] Other (See Comments)    Muscle aches  . Codeine Rash  . Latex Itching and Rash    Reports Elastic in underwear, under wire bras, and now surgical cap cause rash and itching.   . Niacin Other (See Comments)    Muscle aches  . Niaspan [Niacin Er] Other (See Comments)    Muscle aches  . Statins Other (See Comments)    Muscle aches/ cramps  . Sulfa Antibiotics Rash and Other (See Comments)    Muscle aches/ cramps, also    Patient Measurements: Height: 5\' 3"  (160 cm) Weight: 184 lb 3.2 oz (83.6 kg) IBW/kg (Calculated) : 52.4 Heparin Dosing Weight: 69 kg  Vital Signs: Temp: 97.9 F (36.6 C) (02/06 0902) Temp Source: Oral (02/06 0902) BP: 101/54 (02/06 0902) Pulse Rate: 92 (02/06 0902)  Labs: Recent Labs    04/15/19 1214 04/15/19 1423 04/15/19 2313 04/16/19 0057 04/16/19 0508 04/16/19 0834  HGB 12.1  --   --   --  11.8*  --   HCT 36.8  --   --   --  35.0*  --   PLT 265  --   --   --  258  --   HEPARINUNFRC  --   --   --  <0.10*  --  0.32  CREATININE 0.78  --   --   --  0.79  --   TROPONINIHS 156*   < > 340* 493*  --  440*   < > = values in this interval not displayed.    Estimated Creatinine Clearance: 57.5 mL/min (by C-G formula based on SCr of  0.79 mg/dL).   Medical History: Past Medical History:  Diagnosis Date  . AAA (abdominal aortic aneurysm) [facility])    cardiology aware   . Arthritis    OA lt knee- cortizone inj. q 4 months AND OA ALSO IN BACK. Had knee replaced-issue resolved  . CAD (coronary artery disease) CARDIOLOGIST- [facility]--  VISIT 01-02-10 IN EPIC   1993-- PTCA. Pt describes a near total blockage of apparently the LAD and a 65% stenosis elsewhere.  . Cancer ([place])   . History of bladder cancer followed by dr Karsten Ro   hx  TCC of bladder ,  Ta G3-  first occurence 02-20-2012--  s/p BCG tx's  and  mitomycin C  . Hyperlipidemia 1993  . Hypertension 1993  . Nocturia   . Osteopenia   . Sigmoid diverticulosis     Medications:  Medications Prior to Admission  Medication Sig Dispense Refill Last Dose  . acetaminophen (TYLENOL) 500 MG tablet Take 2 tablets (1,000 mg total) by mouth every 8 (eight) hours. (Patient taking differently: Take 1,000 mg by mouth at bedtime as needed for headache (pain/sleep). ) 30 tablet 0 04/14/2019 at pm  .  b complex vitamins tablet Take 1 tablet by mouth daily after breakfast.    04/14/2019 at am  . betamethasone valerate (VALISONE) 0.1 % cream Apply 1 application topically at bedtime. Apply externally to vaginal area   04/14/2019 at pm  . cholecalciferol (VITAMIN D) 1000 UNITS tablet Take 1,000 Units by mouth daily after breakfast.    04/14/2019 at am  . clindamycin (CLEOCIN) 2 % vaginal cream Place 1 Applicatorful vaginally at bedtime.   04/14/2019 at pm  . Co-Enzyme Q10 200 MG CAPS Take 200 mg by mouth daily after breakfast.    04/14/2019 at am  . Flaxseed, Linseed, (FLAX SEED OIL) 1000 MG CAPS Take 1,000 mg by mouth daily after breakfast.    04/14/2019 at am  . gabapentin (NEURONTIN) 100 MG capsule Take 1 capsule (100 mg total) by mouth at bedtime. 90 capsule 3 04/13/2019  . hydrochlorothiazide (MICROZIDE) 12.5 MG capsule TAKE (1) CAPSULE TWICE DAILY. (Patient taking differently: Take 12.5 mg by  mouth 2 (two) times daily. ) 180 capsule 3 04/15/2019 at am  . Magnesium 300 MG CAPS Take 300 mg by mouth at bedtime.    04/14/2019 at pm  . metoprolol tartrate (LOPRESSOR) 50 MG tablet TAKE (1) TABLET TWICE A DAY. (Patient taking differently: Take 50 mg by mouth 2 (two) times daily. TAKE (1) TABLET TWICE A DAY.) 180 tablet 3 04/15/2019 at 0730  . nitroGLYCERIN (NITROSTAT) 0.4 MG SL tablet PLACE 1 TABLET UNDER THE TONGUE AT ONSET OF CHEST PAIN EVERY 5 MINTUES UP TO 3 TIMES AS NEEDED (Patient taking differently: Place 0.4 mg under the tongue every 5 (five) minutes as needed for chest pain. ) 25 tablet 0 04/14/2019 at 2200  . OVER THE COUNTER MEDICATION Take 1 tablet by mouth daily after breakfast. Elderberry/Vitamin C   04/14/2019 at am  . TURMERIC PO Take 1 capsule by mouth daily after breakfast.   04/14/2019 at am   Scheduled:  . acetaminophen  1,000 mg Oral Q8H  . aspirin EC  81 mg Oral Daily  . B-complex with vitamin C  1 tablet Oral Daily  . bumetanide (BUMEX) IV  1 mg Intravenous Daily  . cholecalciferol  1,000 Units Oral Daily  . gabapentin  100 mg Oral QHS  . metoprolol tartrate  25 mg Oral BID  . potassium chloride  40 mEq Oral BID  . vitamin B-12  500 mcg Oral Daily    Assessment: 79 y/o F presenting with shortness of breath. PMH is extensive but significant for h/o bladder cancer, CAD, and abdominal aortic aneurysm from (2017). Pharmacy has been consulted to dose heparin IV infusion for suspected ACS/STEMI.   Hgb and Plt WNL. Troponin plateauing [id number]. Vital signs stable and heart rate slightly elevated. No bleeding concerns noted at this time. Heparin level was drawn ~ 6 hours after rate change rather than 8 hours to prevent multiple sticks for pt. Heparin level is on low range of therapeutic range at 0.32 on heparin rate of 1,100 units/hr.  Hg/hct are stable. No issues with bleeding or infusion.  Goal of Therapy:  Heparin level 0.3-0.7 units/ml Monitor platelets by anticoagulation  protocol: Yes    Plan:  - Continue heparin at 1,100 units/hr - Check Repeat heparin level in 8 hours  - Monitor daily heparin level and CBC - Monitor for s/sx of bleeding  Sherren Kerns, PharmD PGY1 Acute Care Pharmacy Resident

## 2019-04-16 NOTE — Progress Notes (Signed)
ANTICOAGULATION CONSULT NOTE - Initial Consult  Pharmacy Consult for heparin IV Indication: chest pain/ACS  Patient Measurements: Height: 5\' 3"  (160 cm) Weight: 184 lb 3.2 oz (83.6 kg) IBW/kg (Calculated) : 52.4 Heparin Dosing Weight: 69 kg  Vital Signs: Temp: 97.6 F (36.4 C) (02/06 1432) Temp Source: Oral (02/06 1432) BP: 111/69 (02/06 1432) Pulse Rate: 78 (02/06 1432)  Labs: Recent Labs    04/15/19 1214 04/15/19 1423 04/15/19 2313 04/16/19 0057 04/16/19 0508 04/16/19 0834 04/16/19 1612  HGB 12.1  --   --   --  11.8*  --   --   HCT 36.8  --   --   --  35.0*  --   --   PLT 265  --   --   --  258  --   --   HEPARINUNFRC  --   --   --  <0.10*  --  0.32 0.28*  CREATININE 0.78  --   --   --  0.79  --   --   TROPONINIHS 156*   < > 340* 493*  --  440*  --    < > = values in this interval not displayed.    Estimated Creatinine Clearance: 57.5 mL/min (by C-G formula based on SCr of 0.79 mg/dL).  Assessment: 79 y/o F presenting with shortness of breath. PMH is extensive but significant for h/o bladder cancer, CAD, and abdominal aortic aneurysm from (2017). Pharmacy has been consulted to dose heparin IV infusion for suspected ACS/STEMI.   HL just slightly under goal. Hg/hct are stable. No reported bleeding. Will increase rate to keep in goal.   Goal of Therapy:  Heparin level 0.3-0.7 units/ml Monitor platelets by anticoagulation protocol: Yes    Plan:  - Increase heparin to 1,200 units/hr - Check Repeat heparin level in 8 hours  - Monitor daily heparin level and CBC - Monitor for s/sx of bleeding  Benetta Spar, PharmD, BCPS, BCCP Clinical Pharmacist  Please check AMION for all [place] phone numbers After 10:00 PM, call [place]

## 2019-04-16 NOTE — Progress Notes (Signed)
ANTICOAGULATION CONSULT NOTE - Follow Up Consult  Pharmacy Consult for heparin Indication: chest pain/ACS  Labs: Recent Labs    04/15/19 1214 04/15/19 1423 04/15/19 2313 04/16/19 0057  HGB 12.1  --   --   --   HCT 36.8  --   --   --   PLT 265  --   --   --   HEPARINUNFRC  --   --   --  <0.10*  CREATININE 0.78  --   --   --   TROPONINIHS 156* 167* 340*  --     Assessment: 78yo female subtherapeutic on heparin with initial dosing for possible ACS; no gtt issues or signs of bleeding per RN.  Goal of Therapy:  Heparin level 0.3-0.7 units/ml   Plan:  Will rebolus with heparin 1000 units and increase heparin gtt by 4 units/kgABW/hr to 1100 units/hr and check level in 8 hours.    Wynona Neat, PharmD, BCPS  04/16/2019,2:20 AM

## 2019-04-16 NOTE — Plan of Care (Signed)

## 2019-04-16 NOTE — Progress Notes (Addendum)
Pt's HR still 110-180's afib. BP 107/76. Paged Cardiology on call to inquire about meds to be given while awaiting their visit. MD returned call @ 2233 and stated to give metoprolol IV. See orders.

## 2019-04-17 DIAGNOSIS — I4891 Unspecified atrial fibrillation: Secondary | ICD-10-CM

## 2019-04-17 DIAGNOSIS — I5031 Acute diastolic (congestive) heart failure: Secondary | ICD-10-CM

## 2019-04-17 DIAGNOSIS — I248 Other forms of acute ischemic heart disease: Secondary | ICD-10-CM

## 2019-04-17 LAB — CBC
HCT: 33.9 % — ABNORMAL LOW (ref 36.0–46.0)
Hemoglobin: 11.5 g/dL — ABNORMAL LOW (ref 12.0–15.0)
MCH: 32.1 pg (ref 26.0–34.0)
MCHC: 33.9 g/dL (ref 30.0–36.0)
MCV: 94.7 fL (ref 80.0–100.0)
Platelets: 265 10*3/uL (ref 150–400)
RBC: 3.58 MIL/uL — ABNORMAL LOW (ref 3.87–5.11)
RDW: 13.4 % (ref 11.5–15.5)
WBC: 6.6 10*3/uL (ref 4.0–10.5)
nRBC: 0 % (ref 0.0–0.2)

## 2019-04-17 LAB — BASIC METABOLIC PANEL
Anion gap: 12 (ref 5–15)
BUN: 16 mg/dL (ref 8–23)
CO2: 24 mmol/L (ref 22–32)
Calcium: 9 mg/dL (ref 8.9–10.3)
Chloride: 101 mmol/L (ref 98–111)
Creatinine, Ser: 0.94 mg/dL (ref 0.44–1.00)
GFR calc Af Amer: >60 mL/min (ref >60)
GFR calc non Af Amer: 57 mL/min — ABNORMAL LOW (ref >60)
Glucose, Bld: 136 mg/dL — ABNORMAL HIGH (ref 70–99)
Potassium: 3.8 mmol/L (ref 3.5–5.1)
Sodium: 137 mmol/L (ref 135–145)

## 2019-04-17 LAB — HEPARIN LEVEL (UNFRACTIONATED)
Heparin Unfractionated: 0.39 IU/mL (ref 0.30–0.70)
Heparin Unfractionated: 0.6 IU/mL (ref 0.30–0.70)

## 2019-04-17 MED ORDER — BUMETANIDE 1 MG PO TABS
1.0000 mg | ORAL_TABLET | Freq: Every day | ORAL | Status: DC
  Administered 2019-04-17 – 2019-05-02 (×15): 1 mg via ORAL
  Filled 2019-04-17 (×17): qty 1

## 2019-04-17 MED ORDER — AMLODIPINE BESYLATE 5 MG PO TABS: 5.0000 mg | ORAL_TABLET | Freq: Every day | ORAL | Status: DC

## 2019-04-17 MED ORDER — POTASSIUM CHLORIDE CRYS ER 20 MEQ PO TBCR
40.0000 meq | EXTENDED_RELEASE_TABLET | Freq: Every day | ORAL | Status: DC
  Administered 2019-04-17 – 2019-04-24 (×7): 40 meq via ORAL
  Filled 2019-04-17 (×8): qty 2

## 2019-04-17 NOTE — Progress Notes (Signed)
ANTICOAGULATION CONSULT NOTE - Initial Consult  Pharmacy Consult for heparin IV Indication: chest pain/ACS  Patient Measurements: Height: 5\' 3"  (160 cm) Weight: 185 lb 6.4 oz (84.1 kg) IBW/kg (Calculated) : 52.4 Heparin Dosing Weight: 69 kg  Vital Signs: Temp: 97.6 F (36.4 C) (02/07 0522) Temp Source: Oral (02/07 0522) BP: 146/92 (02/07 0522) Pulse Rate: 92 (02/07 0522)  Labs: Recent Labs    04/15/19 1214 04/15/19 1214 04/15/19 1423 04/15/19 2313 04/16/19 0057 04/16/19 0057 04/16/19 0508 04/16/19 0834 04/16/19 0834 04/16/19 1612 04/17/19 0205 04/17/19 0951  HGB 12.1   < >  --   --   --   --  11.8*  --   --   --  11.5*  --   HCT 36.8  --   --   --   --   --  35.0*  --   --   --  33.9*  --   PLT 265  --   --   --   --   --  258  --   --   --  265  --   HEPARINUNFRC  --   --   --   --  <0.10*   < >  --  0.32   < > 0.28* 0.39 0.60  CREATININE 0.78  --   --   --   --   --  0.79  --   --   --  0.94  --   TROPONINIHS 156*  --    < > 340* 493*  --   --  440*  --   --   --   --    < > = values in this interval not displayed.    Estimated Creatinine Clearance: 49.1 mL/min (by C-G formula based on SCr of 0.94 mg/dL).  Assessment: 79 y/o F presenting with shortness of breath. PMH is extensive but significant for h/o bladder cancer, CAD, and abdominal aortic aneurysm from (2017). Pharmacy has been consulted to dose heparin IV infusion for suspected ACS/STEMI.   Heparin level is therapeutic at 0.6 on heparin rate of 1,200 units/hr. Hg/hct are stable. No reported bleeding or problems with infusion per RN.   Goal of Therapy:  Heparin level 0.3-0.7 units/ml Monitor platelets by anticoagulation protocol: Yes    Plan:  - Continue heparin gtt at 1,200 units/hr - Monitor daily heparin level and CBC - Monitor for s/sx of bleeding - Plans noted to change to DOAC if no stress test or cath planned.  Sherren Kerns, PharmD PGY1 Acute Care Pharmacy Resident Please check AMION for all  [place] phone numbers After 10:00 PM, call [place] [phone]

## 2019-04-17 NOTE — Plan of Care (Signed)

## 2019-04-17 NOTE — Progress Notes (Signed)
Progress Note  Patient Name: Gabriela Smith Date of Encounter: 04/17/2019  Primary Cardiologist: Minus Breeding, MD   Subjective   Net negative 1.1L on IV bumex, Cr stable at 0.9.  BP elevated this morning (146/92).  Went in to AF with RVR overnight, rates up to 170s.  No prior history of atrial fibrillation.  She reported feeling palpitations.  Episode lasted about 2.5 hours.  She was given IV metoprolol 2.5 mg x 1 and her p.o. metoprolol was increased to 25 mg every 6 hours.  This morning she has no complaints, reports dyspnea has resolved.  Inpatient Medications    Scheduled Meds: . acetaminophen  1,000 mg Oral Q8H  . aspirin EC  81 mg Oral Daily  . B-complex with vitamin C  1 tablet Oral Daily  . bumetanide (BUMEX) IV  1 mg Intravenous Daily  . cholecalciferol  1,000 Units Oral Daily  . gabapentin  100 mg Oral QHS  . metoprolol tartrate  25 mg Oral Q6H  . potassium chloride  40 mEq Oral BID  . vitamin B-12  500 mcg Oral Daily   Continuous Infusions: . sodium chloride    . heparin 1,200 Units/hr (04/16/19 1834)   PRN Meds: acetaminophen, nitroGLYCERIN, ondansetron (ZOFRAN) IV   Vital Signs    Vitals:   04/16/19 2218 04/16/19 2236 04/16/19 2240 04/17/19 0522  BP: 107/76 110/74 110/90 (!) 146/92  Pulse:    92  Resp:    18  Temp:    97.6 F (36.4 C)  TempSrc:    Oral  SpO2:    99%  Weight:    84.1 kg  Height:        Intake/Output Summary (Last 24 hours) at 04/17/2019 0640 Last data filed at 04/17/2019 0543 Gross per 24 hour  Intake 1338.94 ml  Output 2400 ml  Net -1061.06 ml   Last 3 Weights 04/17/2019 04/16/2019 04/15/2019  Weight (lbs) 185 lb 6.4 oz 184 lb 3.2 oz 187 lb 3.2 oz  Weight (kg) 84.097 kg 83.553 kg 84.913 kg      Telemetry    AF overnight, rates up to 170s, lasted 2.5 hours - Personally Reviewed  ECG    AF with RVR, rate 178, aVR elevation with diffuse ST depression, Q waves in lead III with 1 mm ST elevation.  Repeat EKG overnight shows normal  sinus rhythm, rate 91, ST depressions <1 mm in V4-6, I, II, Q wave in III- Personally Reviewed  Physical Exam   GEN: No acute distress.   Neck: No JVD Cardiac: regular rhythm, normal rate, no murmurs Respiratory: Clear to auscultation bilaterally. GI: Soft, nontender, non-distended  MS: trace edema Neuro:  Nonfocal  Psych: Normal affect   Labs    High Sensitivity Troponin:   Recent Labs  Lab 04/15/19 1214 04/15/19 1423 04/15/19 2313 04/16/19 0057 04/16/19 0834  TROPONINIHS 156* 167* 340* 493* 440*      Chemistry Recent Labs  Lab 04/15/19 1214 04/15/19 1822 04/16/19 0508 04/17/19 0205  NA 136  --  137 137  K 3.4*  --  2.8* 3.8  CL 99  --  98 101  CO2 24  --  26 24  GLUCOSE 116*  --  111* 136*  BUN 11  --  9 16  CREATININE 0.78  --  0.79 0.94  CALCIUM 9.2  --  9.1 9.0  PROT  --  7.3  --   --   ALBUMIN  --  4.0  --   --  AST  --  30  --   --   ALT  --  45*  --   --   ALKPHOS  --  65  --   --   BILITOT  --  1.0  --   --   GFRNONAA >60  --  >60 57*  GFRAA >60  --  >60 >60  ANIONGAP 13  --  13 12     Hematology Recent Labs  Lab 04/15/19 1214 04/16/19 0508 04/17/19 0205  WBC 9.1 7.4 6.6  RBC 3.83* 3.72* 3.58*  HGB 12.1 11.8* 11.5*  HCT 36.8 35.0* 33.9*  MCV 96.1 94.1 94.7  MCH 31.6 31.7 32.1  MCHC 32.9 33.7 33.9  RDW 13.0 13.2 13.4  PLT 265 258 265    BNP Recent Labs  Lab 04/15/19 1822  BNP 841.1*     DDimer  Recent Labs  Lab 04/15/19 1822  DDIMER 1.24*     Radiology    DG Chest 2 View  Result Date: 04/15/2019 CLINICAL DATA:  Shortness of breath. EXAM: CHEST - 2 VIEW COMPARISON:  February 11, 2017. FINDINGS: Stable cardiomegaly. No pneumothorax or pleural effusion is noted. Minimal bibasilar interstitial densities are noted concerning for pulmonary edema. Bony thorax is unremarkable. IMPRESSION: Minimal bibasilar interstitial densities are noted concerning for pulmonary edema. Electronically Signed   By: Marijo Conception M.D.   On:  04/15/2019 12:20   CT ANGIO CHEST PE W OR WO CONTRAST  Result Date: 04/16/2019 CLINICAL DATA:  Shortness of breath. Evaluate for pulmonary embolus. EXAM: CT ANGIOGRAPHY CHEST WITH CONTRAST TECHNIQUE: Multidetector CT imaging of the chest was performed using the standard protocol during bolus administration of intravenous contrast. Multiplanar CT image reconstructions and MIPs were obtained to evaluate the vascular anatomy. CONTRAST:  12mL OMNIPAQUE IOHEXOL 350 MG/ML SOLN COMPARISON:  None. FINDINGS: Cardiovascular: Heart size mildly increased. No substantial pericardial effusion. Coronary artery calcification is evident. Atherosclerotic calcification is noted in the wall of the thoracic aorta. No filling defect in the opacified pulmonary arteries to suggest the presence of an acute pulmonary embolus. Mediastinum/Nodes: No mediastinal lymphadenopathy. There is no hilar lymphadenopathy. The esophagus has normal imaging features. There is no axillary lymphadenopathy. Lungs/Pleura: No suspicious pulmonary nodule or mass. No pulmonary edema. Dependent atelectasis noted in the lower lungs bilaterally. Tiny pleural effusions evident. Upper Abdomen: Unremarkable. Musculoskeletal: No worrisome lytic or sclerotic osseous abnormality. Review of the MIP images confirms the above findings. IMPRESSION: 1. No CT evidence for acute pulmonary embolus. 2. Tiny bilateral pleural effusions with bibasilar atelectasis. 3. Aortic Atherosclerosis (ICD10-I70.0). Electronically Signed   By: Misty Stanley M.D.   On: 04/16/2019 12:26   ECHOCARDIOGRAM COMPLETE  Result Date: 04/16/2019   ECHOCARDIOGRAM REPORT   Patient Name:   Gabriela Smith Date of Exam: 04/16/2019 Medical Rec #:  [id number]      Height:       63.0 in Accession #:    [id number]     Weight:       184.2 lb Date of Birth:  [birth year]      BSA:          1.87 m Patient Age:    79 years       BP:           101/67 mmHg Patient Gender: F              HR:           96 bpm. Exam  Location:  Inpatient  Procedure: 2D Echo, Cardiac Doppler and Color Doppler Indications:    Dyspnea 786.09 / R06.00  History:        Patient has prior history of Echocardiogram examinations, most                 recent 12/24/2015. CAD; Risk Factors:Hypertension and                 Dyslipidemia. Abdominal Aortic Aneurysm.  Sonographer:    Jonelle Sidle Dance Referring Phys: [place]  1. Left ventricular ejection fraction, by visual estimation, is 60 to 65%. The left ventricle has normal function. There is no left ventricular hypertrophy.  2. The left ventricle has no regional wall motion abnormalities.  3. Left ventricular diastolic function could not be evaluated.  4. Global right ventricle has normal systolic function.The right ventricular size is normal. No increase in right ventricular wall thickness.  5. Left atrial size was mildly dilated.  6. Right atrial size was normal.  7. Mild calcification of the anterior mitral valve leaflet(s). Mild thickening of the anterior mitral valve leaflet(s). Moderate mitral annular calcification. Mild to moderate mitral valve regurgitation. No evidence of mitral stenosis.  8. The tricuspid valve is normal in structure. Tricuspid valve regurgitation is mild.  9. The aortic valve was not well visualized. Aortic valve regurgitation is not visualized. No evidence of aortic valve sclerosis or stenosis. 10. The pulmonic valve was normal in structure. Pulmonic valve regurgitation is not visualized. 11. Normal pulmonary artery systolic pressure. 12. The inferior vena cava is normal in size with greater than 50% respiratory variability, suggesting right atrial pressure of 3 mmHg. 13. Small pericardial effusion. 14. The pericardial effusion is anterior to the right ventricle. 15. There appears to be some degree of aortic stenosis but doppler interrogation of the AV was not done. Recommend repeat limited study of the AV to assess for aortic stenosis. FINDINGS  Left  Ventricle: Left ventricular ejection fraction, by visual estimation, is 60 to 65%. The left ventricle has normal function. The left ventricle has no regional wall motion abnormalities. There is no left ventricular hypertrophy. Left ventricular diastolic function could not be evaluated. Indeterminate filling pressures. Right Ventricle: The right ventricular size is normal. No increase in right ventricular wall thickness. Global RV systolic function is has normal systolic function. The tricuspid regurgitant velocity is 2.28 m/s, and with an assumed right atrial pressure  of 3 mmHg, the estimated right ventricular systolic pressure is normal at 23.8 mmHg. Left Atrium: Left atrial size was mildly dilated. Right Atrium: Right atrial size was normal in size Pericardium: A small pericardial effusion is present. The pericardial effusion is anterior to the right ventricle. There is no evidence of cardiac tamponade. Mitral Valve: The mitral valve is normal in structure. There is mild thickening of the anterior mitral valve leaflet(s). There is mild calcification of the anterior mitral valve leaflet(s). Moderate mitral annular calcification. Mild to moderate mitral valve regurgitation. No evidence of mitral valve stenosis by observation. Tricuspid Valve: The tricuspid valve is normal in structure. Tricuspid valve regurgitation is mild. Aortic Valve: The aortic valve was not well visualized. . There is moderate thickening and moderate calcification of the aortic valve. Aortic valve regurgitation is not visualized. The aortic valve is structurally normal, with no evidence of sclerosis or  stenosis. There is moderate thickening of the aortic valve. There is moderate calcification of the aortic valve. There appears to be some degree of aortic stenosis but doppler interrogation of  the AV was not done. Recommend repeat limited study of the AV to assess for aortic stenosis. Pulmonic Valve: The pulmonic valve was normal in structure.  Pulmonic valve regurgitation is not visualized. Pulmonic regurgitation is not visualized. Aorta: The aortic root, ascending aorta and aortic arch are all structurally normal, with no evidence of dilitation or obstruction. Venous: The inferior vena cava is normal in size with greater than 50% respiratory variability, suggesting right atrial pressure of 3 mmHg. IAS/Shunts: No atrial level shunt detected by color flow Doppler. There is no evidence of a patent foramen ovale. No ventricular septal defect is seen or detected. There is no evidence of an atrial septal defect.  LEFT VENTRICLE PLAX 2D LVIDd:         3.66 cm LVIDs:         2.92 cm LV PW:         1.17 cm LV IVS:        1.09 cm LVOT diam:     2.00 cm LV SV:         24 ml LV SV Index:   12.18 LVOT Area:     3.14 cm  RIGHT VENTRICLE             IVC RV Basal diam:  2.23 cm     IVC diam: 1.98 cm RV S prime:     12.70 cm/s TAPSE (M-mode): 2.2 cm LEFT ATRIUM             Index       RIGHT ATRIUM           Index LA diam:        4.50 cm 2.41 cm/m  RA Area:     13.80 cm LA Vol (A2C):   75.3 ml 40.33 ml/m RA Volume:   29.40 ml  15.75 ml/m LA Vol (A4C):   55.1 ml 29.51 ml/m LA Biplane Vol: 66.6 ml 35.67 ml/m  AORTIC VALVE LVOT Vmax:   95.30 cm/s LVOT Vmean:  66.800 cm/s LVOT VTI:    0.215 m  AORTA Ao Root diam: 3.00 cm Ao Asc diam:  2.70 cm MITRAL VALVE                        TRICUSPID VALVE MV Area (PHT): 2.54 cm             TR Peak grad:   20.8 mmHg MV PHT:        86.71 msec           TR Vmax:        228.00 cm/s MV Decel Time: 299 msec MV E velocity: 78.10 cm/s 103 cm/s  SHUNTS MV A velocity: 84.90 cm/s 70.3 cm/s Systemic VTI:  0.22 m MV E/A ratio:  0.92       1.5       Systemic Diam: 2.00 cm  Fransico Him MD Electronically signed by Fransico Him MD Signature Date/Time: 04/16/2019/1:33:08 PM    Final     Cardiac Studies   TTE 04/16/19: 1. Left ventricular ejection fraction, by visual estimation, is 60 to  65%. The left ventricle has normal function. There is  no left ventricular  hypertrophy.  2. The left ventricle has no regional wall motion abnormalities.  3. Left ventricular diastolic function could not be evaluated.  4. Global right ventricle has normal systolic function.The right  ventricular size is normal. No increase in right ventricular wall  thickness.  5. Left  atrial size was mildly dilated.  6. Right atrial size was normal.  7. Mild calcification of the anterior mitral valve leaflet(s). Mild  thickening of the anterior mitral valve leaflet(s). Moderate mitral  annular calcification. Mild to moderate mitral valve regurgitation. No  evidence of mitral stenosis.  8. The tricuspid valve is normal in structure. Tricuspid valve  regurgitation is mild.  9. The aortic valve was not well visualized. Aortic valve regurgitation  is not visualized. No evidence of aortic valve sclerosis or stenosis.  10. The pulmonic valve was normal in structure. Pulmonic valve  regurgitation is not visualized.  11. Normal pulmonary artery systolic pressure.  12. The inferior vena cava is normal in size with greater than 50%  respiratory variability, suggesting right atrial pressure of 3 mmHg.  13. Small pericardial effusion.  14. The pericardial effusion is anterior to the right ventricle.  15. There appears to be some degree of aortic stenosis but doppler  interrogation of the AV was not done. Recommend repeat limited study of  the AV to assess for aortic stenosis.   CTPA 04/16/19: 1. No CT evidence for acute pulmonary embolus. 2. Tiny bilateral pleural effusions with bibasilar atelectasis. 3. Aortic Atherosclerosis (ICD10-I70.0).  Patient Profile     79 y.o. female with a hx of CAD with PTCA in 1993 and neg nuc in 2017, AAA, Bladder cancer, HLD, HTN and arthritis now presents with SOB, weight gain.  No chest pain.  Troponin 156->167->340->493.  ECG sinus with ectopy.  BNP 841.  Assessment & Plan    Shortness of breath: suspect 2/2 diastolic  heart failure, CXR with pulmonary edema, BNP 841. Responded well to IV bumex, appears euvolemic.  IVC small/collapsible on TTE yesterday.  CTPA with no evidence of PE.  TTE shows normal systolic function, but does have grade 2 diastolic dysfunction.  TTE read as possible AS, but it does look like AV gradients were done and show no significant aortic stenosis -Transition to PO bumex  Elevated troponin: remote history of PCI. 156->167->340->493->440.  No WMA on TTE.  Suspect demand ischemia in setting of decompensated heart failure.  No chest pain, however does have multivessel coronary calcifications on CTPA yesterday.  EKG when in AF overnight shows ischemic changes.  Will check lexiscan myoview to evaluate for ischemia  AF with RVR: new diagnosis.  Rates up to 170s.  CHADS-VASC score 5 (HTN,Agex2,CAD, female) - On metoprolo 25 mg q6hr - Continue heparin gtt for now.  If no significant ischemia on lexiscan and no cath planned, will switch to Eliquis  CAD: s/p remote PCI.  Continue ASA, heparin as above  HLD: LDL 185, intolerant to statins and zetia.  Needs PCSK9 inhibitor  Hypokalemia: 2.8 yesterday, improved with repletion.  Hypertension: holding home HCTZ since started on bumex.  Continue metoprolol as above  For questions or updates, please contact [place] Please consult [email or web address] for contact info under      Signed, Donato Heinz, MD  04/17/2019, 6:40 AM

## 2019-04-17 NOTE — Progress Notes (Signed)
ANTICOAGULATION CONSULT NOTE - Follow Up Consult  Pharmacy Consult for heparin Indication: chest pain/ACS  Labs: Recent Labs    04/15/19 1214 04/15/19 1214 04/15/19 1423 04/15/19 2313 04/16/19 0057 04/16/19 0057 04/16/19 0508 04/16/19 0834 04/16/19 1612 04/17/19 0205  HGB 12.1   < >  --   --   --   --  11.8*  --   --  11.5*  HCT 36.8  --   --   --   --   --  35.0*  --   --  33.9*  PLT 265  --   --   --   --   --  258  --   --  265  HEPARINUNFRC  --   --   --   --  <0.10*   < >  --  0.32 0.28* 0.39  CREATININE 0.78  --   --   --   --   --  0.79  --   --   --   TROPONINIHS 156*  --    < > 340* 493*  --   --  440*  --   --    < > = values in this interval not displayed.    Assessment/Plan:  79yo female therapeutic on heparin after rate change. Will continue gtt at current rate and confirm stable with additional level.     Wynona Neat, PharmD, BCPS  04/17/2019,2:47 AM

## 2019-04-18 ENCOUNTER — Inpatient Hospital Stay (HOSPITAL_COMMUNITY): Payer: Medicare Other

## 2019-04-18 DIAGNOSIS — R9439 Abnormal result of other cardiovascular function study: Secondary | ICD-10-CM

## 2019-04-18 DIAGNOSIS — I480 Paroxysmal atrial fibrillation: Secondary | ICD-10-CM

## 2019-04-18 DIAGNOSIS — R079 Chest pain, unspecified: Secondary | ICD-10-CM

## 2019-04-18 LAB — BASIC METABOLIC PANEL
Anion gap: 11 (ref 5–15)
BUN: 17 mg/dL (ref 8–23)
CO2: 24 mmol/L (ref 22–32)
Calcium: 9.1 mg/dL (ref 8.9–10.3)
Chloride: 104 mmol/L (ref 98–111)
Creatinine, Ser: 0.85 mg/dL (ref 0.44–1.00)
GFR calc Af Amer: >60 mL/min (ref >60)
GFR calc non Af Amer: >60 mL/min (ref >60)
Glucose, Bld: 103 mg/dL — ABNORMAL HIGH (ref 70–99)
Potassium: 3.9 mmol/L (ref 3.5–5.1)
Sodium: 139 mmol/L (ref 135–145)

## 2019-04-18 LAB — CBC
HCT: 36.4 % (ref 36.0–46.0)
Hemoglobin: 11.7 g/dL — ABNORMAL LOW (ref 12.0–15.0)
MCH: 31.8 pg (ref 26.0–34.0)
MCHC: 32.1 g/dL (ref 30.0–36.0)
MCV: 98.9 fL (ref 80.0–100.0)
Platelets: 276 10*3/uL (ref 150–400)
RBC: 3.68 MIL/uL — ABNORMAL LOW (ref 3.87–5.11)
RDW: 13.5 % (ref 11.5–15.5)
WBC: 5.8 10*3/uL (ref 4.0–10.5)
nRBC: 0 % (ref 0.0–0.2)

## 2019-04-18 LAB — MAGNESIUM: Magnesium: 2.1 mg/dL (ref 1.7–2.4)

## 2019-04-18 LAB — NM MYOCAR MULTI W/SPECT W/WALL MOTION / EF
Peak HR: 117 {beats}/min
Rest HR: 97 {beats}/min

## 2019-04-18 LAB — HEPARIN LEVEL (UNFRACTIONATED): Heparin Unfractionated: 0.55 IU/mL (ref 0.30–0.70)

## 2019-04-18 MED ORDER — SODIUM CHLORIDE 0.9% FLUSH: 3.0000 mL | INTRAVENOUS | Status: DC | PRN

## 2019-04-18 MED ORDER — ACETAMINOPHEN 500 MG PO TABS: 1000.0000 mg | ORAL_TABLET | Freq: Three times a day (TID) | ORAL | Status: DC | PRN

## 2019-04-18 MED ORDER — TECHNETIUM TC 99M TETROFOSMIN IV KIT
10.0000 | PACK | Freq: Once | INTRAVENOUS | Status: AC | PRN
  Administered 2019-04-18: 10 via INTRAVENOUS

## 2019-04-18 MED ORDER — APIXABAN 5 MG PO TABS
5.0000 mg | ORAL_TABLET | Freq: Two times a day (BID) | ORAL | Status: DC
  Administered 2019-04-18 (×2): 5 mg via ORAL
  Filled 2019-04-18 (×2): qty 1

## 2019-04-18 MED ORDER — REGADENOSON 0.4 MG/5ML IV SOLN
INTRAVENOUS | Status: AC
  Filled 2019-04-18: qty 5

## 2019-04-18 MED ORDER — METOPROLOL TARTRATE 50 MG PO TABS
50.0000 mg | ORAL_TABLET | Freq: Two times a day (BID) | ORAL | Status: DC
  Administered 2019-04-18 – 2019-04-19 (×4): 50 mg via ORAL
  Filled 2019-04-18 (×5): qty 1

## 2019-04-18 MED ORDER — SODIUM CHLORIDE 0.9% FLUSH
3.0000 mL | Freq: Two times a day (BID) | INTRAVENOUS | Status: DC
  Administered 2019-04-18 – 2019-04-24 (×9): 3 mL via INTRAVENOUS

## 2019-04-18 MED ORDER — SODIUM CHLORIDE 0.9 % IV SOLN: 250.0000 mL | INTRAVENOUS | Status: DC | PRN

## 2019-04-18 MED ORDER — REGADENOSON 0.4 MG/5ML IV SOLN
0.4000 mg | Freq: Once | INTRAVENOUS | Status: AC
  Administered 2019-04-18: 0.4 mg via INTRAVENOUS
  Filled 2019-04-18: qty 5

## 2019-04-18 MED ORDER — SODIUM CHLORIDE 0.9 % IV SOLN: INTRAVENOUS | Status: DC

## 2019-04-18 MED ORDER — TECHNETIUM TC 99M TETROFOSMIN IV KIT
30.0000 | PACK | Freq: Once | INTRAVENOUS | Status: AC | PRN
  Administered 2019-04-18: 30 via INTRAVENOUS

## 2019-04-18 MED ORDER — ASPIRIN 81 MG PO CHEW
81.0000 mg | CHEWABLE_TABLET | ORAL | Status: AC
  Administered 2019-04-19: 81 mg via ORAL
  Filled 2019-04-18: qty 1

## 2019-04-18 NOTE — Discharge Instructions (Signed)

## 2019-04-18 NOTE — Plan of Care (Signed)

## 2019-04-18 NOTE — Progress Notes (Signed)
    Patient presented for Lexiscan nuclear stress test. Tolerated procedure well. Pending final stress imaging result.  Daune Perch, AGNP-C 04/18/2019  9:41 AM Pager: [phone]

## 2019-04-18 NOTE — Progress Notes (Signed)
Tolerated stress test well no chest pain

## 2019-04-18 NOTE — Progress Notes (Addendum)
Progress Note  Patient Name: Gabriela Smith Date of Encounter: 04/18/2019  Primary Cardiologist: Minus Breeding, MD   Subjective   Pt seen during stress test. She is feeling back to her normal. Edema has resolved. She slept well last night without orthopnea. No chest discomfort.   Inpatient Medications    Scheduled Meds: . regadenoson      . aspirin EC  81 mg Oral Daily  . B-complex with vitamin C  1 tablet Oral Daily  . bumetanide  1 mg Oral Daily  . cholecalciferol  1,000 Units Oral Daily  . gabapentin  100 mg Oral QHS  . metoprolol tartrate  25 mg Oral Q6H  . potassium chloride  40 mEq Oral Daily  . regadenoson  0.4 mg Intravenous Once  . vitamin B-12  500 mcg Oral Daily   Continuous Infusions: . sodium chloride    . heparin 1,200 Units/hr (04/17/19 1244)   PRN Meds: acetaminophen, acetaminophen, nitroGLYCERIN, ondansetron (ZOFRAN) IV   Vital Signs    Vitals:   04/17/19 1919 04/18/19 0326 04/18/19 0857 04/18/19 0900  BP: 129/65 (!) 143/81 (!) 176/99 (!) 167/98  Pulse: 73 74    Resp: 18 18 16 16   Temp: 98.1 F (36.7 C) 97.6 F (36.4 C)    TempSrc: Oral Oral    SpO2: 96% 98%    Weight:  83.9 kg    Height:        Intake/Output Summary (Last 24 hours) at 04/18/2019 0913 Last data filed at 04/18/2019 0300 Gross per 24 hour  Intake 911.94 ml  Output 1300 ml  Net -388.06 ml   Last 3 Weights 04/18/2019 04/17/2019 04/16/2019  Weight (lbs) 185 lb 185 lb 6.4 oz 184 lb 3.2 oz  Weight (kg) 83.915 kg 84.097 kg 83.553 kg      Telemetry    Sinus rhythm with PACs - Personally Reviewed  ECG    No new tracings for review  Physical Exam   GEN: No acute distress.   Neck: No JVD Cardiac: RRR, no murmurs, rubs, or gallops.  Respiratory: Clear to auscultation bilaterally. GI: Soft, nontender, non-distended  MS: No edema; No deformity. Neuro:  Nonfocal  Psych: Normal affect   Labs    High Sensitivity Troponin:   Recent Labs  Lab 04/15/19 1214 04/15/19 1423  04/15/19 2313 04/16/19 0057 04/16/19 0834  TROPONINIHS 156* 167* 340* 493* 440*      Chemistry Recent Labs  Lab 04/15/19 1214 04/15/19 1822 04/16/19 0508 04/17/19 0205 04/18/19 0314  NA   < >  --  137 137 139  K   < >  --  2.8* 3.8 3.9  CL   < >  --  98 101 104  CO2   < >  --  26 24 24   GLUCOSE   < >  --  111* 136* 103*  BUN   < >  --  9 16 17   CREATININE   < >  --  0.79 0.94 0.85  CALCIUM   < >  --  9.1 9.0 9.1  PROT  --  7.3  --   --   --   ALBUMIN  --  4.0  --   --   --   AST  --  30  --   --   --   ALT  --  45*  --   --   --   ALKPHOS  --  65  --   --   --  BILITOT  --  1.0  --   --   --   GFRNONAA   < >  --  >60 57* >60  GFRAA   < >  --  >60 >60 >60  ANIONGAP   < >  --  13 12 11    < > = values in this interval not displayed.     Hematology Recent Labs  Lab 04/16/19 0508 04/17/19 0205 04/18/19 0314  WBC 7.4 6.6 5.8  RBC 3.72* 3.58* 3.68*  HGB 11.8* 11.5* 11.7*  HCT 35.0* 33.9* 36.4  MCV 94.1 94.7 98.9  MCH 31.7 32.1 31.8  MCHC 33.7 33.9 32.1  RDW 13.2 13.4 13.5  PLT 258 265 276    BNP Recent Labs  Lab 04/15/19 1822  BNP 841.1*     DDimer  Recent Labs  Lab 04/15/19 1822  DDIMER 1.24*     Radiology    CT ANGIO CHEST PE W OR WO CONTRAST  Result Date: 04/16/2019 CLINICAL DATA:  Shortness of breath. Evaluate for pulmonary embolus. EXAM: CT ANGIOGRAPHY CHEST WITH CONTRAST TECHNIQUE: Multidetector CT imaging of the chest was performed using the standard protocol during bolus administration of intravenous contrast. Multiplanar CT image reconstructions and MIPs were obtained to evaluate the vascular anatomy. CONTRAST:  52mL OMNIPAQUE IOHEXOL 350 MG/ML SOLN COMPARISON:  None. FINDINGS: Cardiovascular: Heart size mildly increased. No substantial pericardial effusion. Coronary artery calcification is evident. Atherosclerotic calcification is noted in the wall of the thoracic aorta. No filling defect in the opacified pulmonary arteries to suggest the  presence of an acute pulmonary embolus. Mediastinum/Nodes: No mediastinal lymphadenopathy. There is no hilar lymphadenopathy. The esophagus has normal imaging features. There is no axillary lymphadenopathy. Lungs/Pleura: No suspicious pulmonary nodule or mass. No pulmonary edema. Dependent atelectasis noted in the lower lungs bilaterally. Tiny pleural effusions evident. Upper Abdomen: Unremarkable. Musculoskeletal: No worrisome lytic or sclerotic osseous abnormality. Review of the MIP images confirms the above findings. IMPRESSION: 1. No CT evidence for acute pulmonary embolus. 2. Tiny bilateral pleural effusions with bibasilar atelectasis. 3. Aortic Atherosclerosis (ICD10-I70.0). Electronically Signed   By: Misty Stanley M.D.   On: 04/16/2019 12:26   ECHOCARDIOGRAM COMPLETE  Result Date: 04/16/2019   ECHOCARDIOGRAM REPORT   Patient Name:   Gabriela Smith Date of Exam: 04/16/2019 Medical Rec #:  [id number]      Height:       63.0 in Accession #:    [id number]     Weight:       184.2 lb Date of Birth:  10/01/1940      BSA:          1.87 m Patient Age:    79 years       BP:           101/67 mmHg Patient Gender: F              HR:           96 bpm. Exam Location:  Inpatient Procedure: 2D Echo, Cardiac Doppler and Color Doppler Indications:    Dyspnea 786.09 / R06.00  History:        Patient has prior history of Echocardiogram examinations, most                 recent 12/24/2015. CAD; Risk Factors:Hypertension and                 Dyslipidemia. Abdominal Aortic Aneurysm.  Sonographer:    Jonelle Sidle Dance Referring Phys: [place]  INGOLD IMPRESSIONS  1. Left ventricular ejection fraction, by visual estimation, is 60 to 65%. The left ventricle has normal function. There is no left ventricular hypertrophy.  2. The left ventricle has no regional wall motion abnormalities.  3. Left ventricular diastolic function could not be evaluated.  4. Global right ventricle has normal systolic function.The right ventricular size is  normal. No increase in right ventricular wall thickness.  5. Left atrial size was mildly dilated.  6. Right atrial size was normal.  7. Mild calcification of the anterior mitral valve leaflet(s). Mild thickening of the anterior mitral valve leaflet(s). Moderate mitral annular calcification. Mild to moderate mitral valve regurgitation. No evidence of mitral stenosis.  8. The tricuspid valve is normal in structure. Tricuspid valve regurgitation is mild.  9. The aortic valve was not well visualized. Aortic valve regurgitation is not visualized. No evidence of aortic valve sclerosis or stenosis. 10. The pulmonic valve was normal in structure. Pulmonic valve regurgitation is not visualized. 11. Normal pulmonary artery systolic pressure. 12. The inferior vena cava is normal in size with greater than 50% respiratory variability, suggesting right atrial pressure of 3 mmHg. 13. Small pericardial effusion. 14. The pericardial effusion is anterior to the right ventricle. 15. There appears to be some degree of aortic stenosis but doppler interrogation of the AV was not done. Recommend repeat limited study of the AV to assess for aortic stenosis. FINDINGS  Left Ventricle: Left ventricular ejection fraction, by visual estimation, is 60 to 65%. The left ventricle has normal function. The left ventricle has no regional wall motion abnormalities. There is no left ventricular hypertrophy. Left ventricular diastolic function could not be evaluated. Indeterminate filling pressures. Right Ventricle: The right ventricular size is normal. No increase in right ventricular wall thickness. Global RV systolic function is has normal systolic function. The tricuspid regurgitant velocity is 2.28 m/s, and with an assumed right atrial pressure  of 3 mmHg, the estimated right ventricular systolic pressure is normal at 23.8 mmHg. Left Atrium: Left atrial size was mildly dilated. Right Atrium: Right atrial size was normal in size Pericardium: A small  pericardial effusion is present. The pericardial effusion is anterior to the right ventricle. There is no evidence of cardiac tamponade. Mitral Valve: The mitral valve is normal in structure. There is mild thickening of the anterior mitral valve leaflet(s). There is mild calcification of the anterior mitral valve leaflet(s). Moderate mitral annular calcification. Mild to moderate mitral valve regurgitation. No evidence of mitral valve stenosis by observation. Tricuspid Valve: The tricuspid valve is normal in structure. Tricuspid valve regurgitation is mild. Aortic Valve: The aortic valve was not well visualized. . There is moderate thickening and moderate calcification of the aortic valve. Aortic valve regurgitation is not visualized. The aortic valve is structurally normal, with no evidence of sclerosis or  stenosis. There is moderate thickening of the aortic valve. There is moderate calcification of the aortic valve. There appears to be some degree of aortic stenosis but doppler interrogation of the AV was not done. Recommend repeat limited study of the AV to assess for aortic stenosis. Pulmonic Valve: The pulmonic valve was normal in structure. Pulmonic valve regurgitation is not visualized. Pulmonic regurgitation is not visualized. Aorta: The aortic root, ascending aorta and aortic arch are all structurally normal, with no evidence of dilitation or obstruction. Venous: The inferior vena cava is normal in size with greater than 50% respiratory variability, suggesting right atrial pressure of 3 mmHg. IAS/Shunts: No atrial level shunt detected by  color flow Doppler. There is no evidence of a patent foramen ovale. No ventricular septal defect is seen or detected. There is no evidence of an atrial septal defect.  LEFT VENTRICLE PLAX 2D LVIDd:         3.66 cm LVIDs:         2.92 cm LV PW:         1.17 cm LV IVS:        1.09 cm LVOT diam:     2.00 cm LV SV:         24 ml LV SV Index:   12.18 LVOT Area:     3.14 cm   RIGHT VENTRICLE             IVC RV Basal diam:  2.23 cm     IVC diam: 1.98 cm RV S prime:     12.70 cm/s TAPSE (M-mode): 2.2 cm LEFT ATRIUM             Index       RIGHT ATRIUM           Index LA diam:        4.50 cm 2.41 cm/m  RA Area:     13.80 cm LA Vol (A2C):   75.3 ml 40.33 ml/m RA Volume:   29.40 ml  15.75 ml/m LA Vol (A4C):   55.1 ml 29.51 ml/m LA Biplane Vol: 66.6 ml 35.67 ml/m  AORTIC VALVE LVOT Vmax:   95.30 cm/s LVOT Vmean:  66.800 cm/s LVOT VTI:    0.215 m  AORTA Ao Root diam: 3.00 cm Ao Asc diam:  2.70 cm MITRAL VALVE                        TRICUSPID VALVE MV Area (PHT): 2.54 cm             TR Peak grad:   20.8 mmHg MV PHT:        86.71 msec           TR Vmax:        228.00 cm/s MV Decel Time: 299 msec MV E velocity: 78.10 cm/s 103 cm/s  SHUNTS MV A velocity: 84.90 cm/s 70.3 cm/s Systemic VTI:  0.22 m MV E/A ratio:  0.92       1.5       Systemic Diam: 2.00 cm  Fransico Him MD Electronically signed by Fransico Him MD Signature Date/Time: 04/16/2019/1:33:08 PM    Final     Cardiac Studies   Echocardiogram 04/16/2019 IMPRESSIONS   1. Left ventricular ejection fraction, by visual estimation, is 60 to  65%. The left ventricle has normal function. There is no left ventricular  hypertrophy.  2. The left ventricle has no regional wall motion abnormalities.  3. Left ventricular diastolic function could not be evaluated.  4. Global right ventricle has normal systolic function.The right  ventricular size is normal. No increase in right ventricular wall  thickness.  5. Left atrial size was mildly dilated.  6. Right atrial size was normal.  7. Mild calcification of the anterior mitral valve leaflet(s). Mild  thickening of the anterior mitral valve leaflet(s). Moderate mitral  annular calcification. Mild to moderate mitral valve regurgitation. No  evidence of mitral stenosis.  8. The tricuspid valve is normal in structure. Tricuspid valve  regurgitation is mild.  9. The aortic  valve was not well visualized. Aortic valve regurgitation  is not visualized. No evidence of aortic  valve sclerosis or stenosis.  10. The pulmonic valve was normal in structure. Pulmonic valve  regurgitation is not visualized.  11. Normal pulmonary artery systolic pressure.  12. The inferior vena cava is normal in size with greater than 50%  respiratory variability, suggesting right atrial pressure of 3 mmHg.  13. Small pericardial effusion.  14. The pericardial effusion is anterior to the right ventricle.  15. There appears to be some degree of aortic stenosis but doppler  interrogation of the AV was not done. Recommend repeat limited study of  the AV to assess for aortic stenosis.   Patient Profile     79 y.o. female with a hx of CAD with PTCA in 1993 and neg nuc in 2017, AAA, Bladder cancer, HLD, HTN and arthritis now presents with SOB, weight gain.  No chest pain.  Troponin 156->167->340->493.  ECG sinus with ectopy.  BNP 841.  Assessment & Plan    Acute diastolic heart failure -In the setting of AF with RVR. -Has responded well to IV Bumex. Edema is resolved and breathing back to normal. Transitioned to oral Bumex.  -Pt had 1.3 L UOP yesterday and is net negative 2.7L since admission. Wt is down 2 pounds since admission.  -TTE shows normal LV systolic function. Possible AS, but AV gradients showed no significant aortic stenosis.   Elevated troponin -Hx of remote PCI.  -HS troponins 156->167->340->493->440.  No WMA on TTE.  Suspect demand ischemia in setting of decompensated heart failure.  No chest pain, however does have multivessel coronary calcifications on CTPA yesterday.   -Pt currently undergoing lexiscan myoview to evaluate for myocardial ischemia.  Paroxysmal atrial fibrillation with RVR, new onset -Initially rates up to the 170's. Now in NSR with PACs. -On metoprolol 25 mg q 6hrs. Plan to consolidate dosing to Lopressor 50 mg BID.  -CHADS-VASC score 5 (HTN,Agex2,CAD,  female). Currently on IV heparin for stroke risk reduction. Plan to transition to Eliquis. Order placed for pharmacy to transition pt from heparin to Eliquis.   CAD -S/P remote PCI.  -Continues on aspirin and heparin. May consider stopping aspirin in the setting of need for anticoagulation if myoview is normal.   HLD -LDL 185, intolerant to statins and zetia.  Consider referral to lipid clinic for PCSK9 inhibitor  Hypokalemia  -Improved with repletion.  Hypertension -Holding home HCTZ since started on bumex.  Continue metoprolol as above   For questions or updates, please contact [place] Please consult [email or web address] for contact info under        Signed, Daune Perch, NP  04/18/2019, 9:13 AM     History and all data above reviewed.  Patient examined.  I agree with the findings as above.    The stress test is abnormal.  I compared this to previous 2017 which was a stress echo.  There was no suggestion of ischemia or infarct on that study.   The patient exam reveals COR:RRR  ,  Lungs: Clear  ,  Abd: Positive bowel sounds, no rebound no guarding, Ext No edema  .  All available labs, radiology testing, previous records reviewed. Agree with documented assessment and plan.   CAD:  Given the past history, elevated troponin and Lexiscan Myoview results, cath is indicated.   The patient understands that risks included but are not limited to stroke (1 in 1000), death (1 in 56), kidney failure [usually temporary] (1 in 500), bleeding (1 in 200), allergic reaction [possibly serious] (1 in 200).  The patient understands and  agrees to proceed.   Start [place] after cath.    Jeneen Rinks Suhailah Kwan  4:58 PM  04/18/2019

## 2019-04-18 NOTE — Progress Notes (Addendum)
ANTICOAGULATION CONSULT NOTE  Pharmacy Consult for heparin IV Indication: chest pain/ACS  Patient Measurements: Height: 5\' 3"  (160 cm) Weight: 185 lb (83.9 kg)(scale a) IBW/kg (Calculated) : 52.4 Heparin Dosing Weight: 69 kg  Vital Signs: Temp: 97.6 F (36.4 C) (02/08 0326) Temp Source: Oral (02/08 0326) BP: 143/81 (02/08 0326) Pulse Rate: 74 (02/08 0326)  Labs: Recent Labs    04/15/19 1214 04/15/19 2313 04/16/19 0057 04/16/19 0057 04/16/19 0508 04/16/19 0834 04/16/19 1612 04/17/19 0205 04/17/19 0951 04/18/19 0314  HGB   < >  --   --    < > 11.8*  --   --  11.5*  --  11.7*  HCT   < >  --   --   --  35.0*  --   --  33.9*  --  36.4  PLT   < >  --   --   --  258  --   --  265  --  276  HEPARINUNFRC  --   --  <0.10*   < >  --  0.32   < > 0.39 0.60 0.55  CREATININE   < >  --   --   --  0.79  --   --  0.94  --  0.85  TROPONINIHS  --  340* 493*  --   --  440*  --   --   --   --    < > = values in this interval not displayed.    Estimated Creatinine Clearance: 54.2 mL/min (by C-G formula based on SCr of 0.85 mg/dL).  Assessment: 79 y/o F presenting with shortness of breath. PMH is extensive but significant for h/o bladder cancer, CAD, and abdominal aortic aneurysm from (2017). Pharmacy has been consulted to dose heparin IV infusion for suspected ACS/STEMI. She is also noted with new onset afib and plans for apixaban once procedures are complete -heparin level at goal  Goal of Therapy:  Heparin level 0.3-0.7 units/ml Monitor platelets by anticoagulation protocol: Yes    Plan:  - Continue heparin gtt at 1,200 units/hr - Monitor daily heparin level and CBC  Hildred Laser, PharmD Clinical Pharmacist **Pharmacist phone directory can now be found on [email or web address] ([email or web address]).  Listed under [place].   Addendum -Changing to apixaban -SCr= 0.8  Plan -apixaban 5mg  po bid -will provide patient education  Hildred Laser, PharmD Clinical Pharmacist **Pharmacist phone  directory can now be found on [email or web address] ([email or web address]).  Listed under [place].

## 2019-04-19 ENCOUNTER — Inpatient Hospital Stay (HOSPITAL_COMMUNITY): Payer: Medicare Other

## 2019-04-19 ENCOUNTER — Ambulatory Visit: Payer: Medicare Other | Attending: Internal Medicine

## 2019-04-19 ENCOUNTER — Encounter (HOSPITAL_COMMUNITY)
Admission: EM | Disposition: A | Discharge status: Home / Self Care | Payer: Self-pay | Source: Home / Self Care | Attending: Cardiology

## 2019-04-19 DIAGNOSIS — E785 Hyperlipidemia, unspecified: Secondary | ICD-10-CM

## 2019-04-19 DIAGNOSIS — I25110 Atherosclerotic heart disease of native coronary artery with unstable angina pectoris: Secondary | ICD-10-CM

## 2019-04-19 DIAGNOSIS — Z01810 Encounter for preprocedural cardiovascular examination: Secondary | ICD-10-CM

## 2019-04-19 DIAGNOSIS — I5031 Acute diastolic (congestive) heart failure: Secondary | ICD-10-CM

## 2019-04-19 DIAGNOSIS — I350 Nonrheumatic aortic (valve) stenosis: Secondary | ICD-10-CM

## 2019-04-19 DIAGNOSIS — I6523 Occlusion and stenosis of bilateral carotid arteries: Secondary | ICD-10-CM

## 2019-04-19 DIAGNOSIS — I2510 Atherosclerotic heart disease of native coronary artery without angina pectoris: Secondary | ICD-10-CM

## 2019-04-19 HISTORY — PX: LEFT HEART CATH AND CORONARY ANGIOGRAPHY: CATH118249

## 2019-04-19 LAB — CBC
HCT: 37.5 % (ref 36.0–46.0)
Hemoglobin: 12 g/dL (ref 12.0–15.0)
MCH: 31.3 pg (ref 26.0–34.0)
MCHC: 32 g/dL (ref 30.0–36.0)
MCV: 97.9 fL (ref 80.0–100.0)
Platelets: 272 10*3/uL (ref 150–400)
RBC: 3.83 MIL/uL — ABNORMAL LOW (ref 3.87–5.11)
RDW: 13.5 % (ref 11.5–15.5)
WBC: 5.5 10*3/uL (ref 4.0–10.5)
nRBC: 0 % (ref 0.0–0.2)

## 2019-04-19 LAB — BASIC METABOLIC PANEL
Anion gap: 13 (ref 5–15)
BUN: 16 mg/dL (ref 8–23)
CO2: 24 mmol/L (ref 22–32)
Calcium: 9.3 mg/dL (ref 8.9–10.3)
Chloride: 101 mmol/L (ref 98–111)
Creatinine, Ser: 0.83 mg/dL (ref 0.44–1.00)
GFR calc Af Amer: >60 mL/min (ref >60)
GFR calc non Af Amer: >60 mL/min (ref >60)
Glucose, Bld: 103 mg/dL — ABNORMAL HIGH (ref 70–99)
Potassium: 3.8 mmol/L (ref 3.5–5.1)
Sodium: 138 mmol/L (ref 135–145)

## 2019-04-19 LAB — ECHOCARDIOGRAM LIMITED
Height: 63 in
Weight: 2937.6 oz

## 2019-04-19 LAB — SURGICAL PCR SCREEN
MRSA, PCR: NEGATIVE
Staphylococcus aureus: NEGATIVE

## 2019-04-19 SURGERY — LEFT HEART CATH AND CORONARY ANGIOGRAPHY
Anesthesia: LOCAL

## 2019-04-19 MED ORDER — IOHEXOL 350 MG/ML SOLN
INTRAVENOUS | Status: DC | PRN
  Administered 2019-04-19: 125 mL

## 2019-04-19 MED ORDER — HEPARIN SODIUM (PORCINE) 1000 UNIT/ML IJ SOLN
INTRAMUSCULAR | Status: DC | PRN
  Administered 2019-04-19: 4000 [IU] via INTRAVENOUS

## 2019-04-19 MED ORDER — HEPARIN (PORCINE) IN NACL 1000-0.9 UT/500ML-% IV SOLN
INTRAVENOUS | Status: AC
  Filled 2019-04-19: qty 1000

## 2019-04-19 MED ORDER — SODIUM CHLORIDE 0.9% FLUSH: 3.0000 mL | INTRAVENOUS | Status: DC | PRN

## 2019-04-19 MED ORDER — HEPARIN (PORCINE) 25000 UT/250ML-% IV SOLN
1200.0000 [IU]/h | INTRAVENOUS | Status: DC
  Administered 2019-04-19: 1200 [IU]/h via INTRAVENOUS
  Filled 2019-04-19: qty 250

## 2019-04-19 MED ORDER — LIDOCAINE HCL (PF) 1 % IJ SOLN
INTRAMUSCULAR | Status: AC
  Filled 2019-04-19: qty 30

## 2019-04-19 MED ORDER — SODIUM CHLORIDE 0.9% FLUSH
3.0000 mL | Freq: Two times a day (BID) | INTRAVENOUS | Status: DC
  Administered 2019-04-20 – 2019-04-24 (×7): 3 mL via INTRAVENOUS

## 2019-04-19 MED ORDER — AMLODIPINE BESYLATE 5 MG PO TABS
2.5000 mg | ORAL_TABLET | Freq: Every day | ORAL | Status: DC
  Administered 2019-04-19: 2.5 mg via ORAL
  Filled 2019-04-19 (×2): qty 1

## 2019-04-19 MED ORDER — MUPIROCIN 2 % EX OINT
1.0000 "application " | TOPICAL_OINTMENT | Freq: Two times a day (BID) | CUTANEOUS | Status: AC
  Administered 2019-04-19 – 2019-04-24 (×10): 1 via NASAL
  Filled 2019-04-19 (×3): qty 22

## 2019-04-19 MED ORDER — MIDAZOLAM HCL 2 MG/2ML IJ SOLN
INTRAMUSCULAR | Status: AC
  Filled 2019-04-19: qty 2

## 2019-04-19 MED ORDER — LABETALOL HCL 5 MG/ML IV SOLN: 10.0000 mg | INTRAVENOUS | Status: AC | PRN

## 2019-04-19 MED ORDER — HYDRALAZINE HCL 20 MG/ML IJ SOLN
10.0000 mg | INTRAMUSCULAR | Status: AC | PRN
  Administered 2019-04-19: 10 mg via INTRAVENOUS
  Filled 2019-04-19: qty 1

## 2019-04-19 MED ORDER — VERAPAMIL HCL 2.5 MG/ML IV SOLN
INTRAVENOUS | Status: AC
  Filled 2019-04-19: qty 2

## 2019-04-19 MED ORDER — VERAPAMIL HCL 2.5 MG/ML IV SOLN
INTRAVENOUS | Status: DC | PRN
  Administered 2019-04-19: 10 mL via INTRA_ARTERIAL

## 2019-04-19 MED ORDER — LABETALOL HCL 5 MG/ML IV SOLN
INTRAVENOUS | Status: AC
  Administered 2019-04-19: 10 mg via INTRAVENOUS
  Filled 2019-04-19: qty 4

## 2019-04-19 MED ORDER — SODIUM CHLORIDE 0.9 % IV SOLN: 250.0000 mL | INTRAVENOUS | Status: DC | PRN

## 2019-04-19 MED ORDER — FENTANYL CITRATE (PF) 100 MCG/2ML IJ SOLN
INTRAMUSCULAR | Status: DC | PRN
  Administered 2019-04-19: 25 ug via INTRAVENOUS

## 2019-04-19 MED ORDER — HEPARIN SODIUM (PORCINE) 1000 UNIT/ML IJ SOLN
INTRAMUSCULAR | Status: AC
  Filled 2019-04-19: qty 1

## 2019-04-19 MED ORDER — SODIUM CHLORIDE 0.9 % IV SOLN: INTRAVENOUS | Status: AC

## 2019-04-19 MED ORDER — MIDAZOLAM HCL 2 MG/2ML IJ SOLN
INTRAMUSCULAR | Status: DC | PRN
  Administered 2019-04-19: 1 mg via INTRAVENOUS

## 2019-04-19 MED ORDER — LIP MEDEX EX OINT
TOPICAL_OINTMENT | CUTANEOUS | Status: DC | PRN
  Filled 2019-04-19: qty 7

## 2019-04-19 MED ORDER — LIDOCAINE HCL (PF) 1 % IJ SOLN
INTRAMUSCULAR | Status: DC | PRN
  Administered 2019-04-19: 2 mL via INTRADERMAL

## 2019-04-19 MED ORDER — HEPARIN (PORCINE) IN NACL 1000-0.9 UT/500ML-% IV SOLN
INTRAVENOUS | Status: DC | PRN
  Administered 2019-04-19 (×2): 500 mL

## 2019-04-19 MED ORDER — FENTANYL CITRATE (PF) 100 MCG/2ML IJ SOLN
INTRAMUSCULAR | Status: AC
  Filled 2019-04-19: qty 2

## 2019-04-19 SURGICAL SUPPLY — 15 items
CATH INFINITI 5 FR JL3.5 (CATHETERS) ×1 IMPLANT
CATH LAUNCHER 5F RADL (CATHETERS) IMPLANT
CATH LAUNCHER 6FR EBU3.5 (CATHETERS) ×1 IMPLANT
CATH OPTITORQUE TIG 4.0 5F (CATHETERS) ×1 IMPLANT
CATHETER LAUNCHER 5F RADL (CATHETERS) ×2
DEVICE RAD COMP TR BAND LRG (VASCULAR PRODUCTS) ×1 IMPLANT
GLIDESHEATH SLEND SS 6F .021 (SHEATH) ×1 IMPLANT
GUIDEWIRE INQWIRE 1.5J.035X260 (WIRE) IMPLANT
INQWIRE 1.5J .035X260CM (WIRE) ×2
KIT HEART LEFT (KITS) ×2 IMPLANT
PACK CARDIAC CATHETERIZATION (CUSTOM PROCEDURE TRAY) ×2 IMPLANT
SHEATH PROBE COVER 6X72 (BAG) ×1 IMPLANT
TRANSDUCER W/STOPCOCK (MISCELLANEOUS) ×2 IMPLANT
TUBING CIL FLEX 10 FLL-RA (TUBING) ×2 IMPLANT
WIRE HI TORQ VERSACORE-J 145CM (WIRE) ×1 IMPLANT

## 2019-04-19 NOTE — Plan of Care (Signed)
Plan of care reviewed with pt at bedside. NPO except meds. Pt independent. CHG bath given. Waiting to be taken for procedure. Call bell in reach. Pt denies further needs at this time, will continue to monitor.  Problem: Education: Goal: Ability to demonstrate management of disease process will improve Outcome: Progressing Goal: Ability to verbalize understanding of medication therapies will improve Outcome: Progressing Goal: Individualized Educational Video(s) Outcome: Progressing   Problem: Activity: Goal: Capacity to carry out activities will improve Outcome: Progressing   Problem: Cardiac: Goal: Ability to achieve and maintain adequate cardiopulmonary perfusion will improve Outcome: Progressing   Problem: Education: Goal: Knowledge of General Education information will improve Description: Including pain rating scale, medication(s)/side effects and non-pharmacologic comfort measures Outcome: Progressing   Problem: Health Behavior/Discharge Planning: Goal: Ability to manage health-related needs will improve Outcome: Progressing   Problem: Clinical Measurements: Goal: Ability to maintain clinical measurements within normal limits will improve Outcome: Progressing Goal: Will remain free from infection Outcome: Progressing Goal: Diagnostic test results will improve Outcome: Progressing Goal: Respiratory complications will improve Outcome: Progressing Goal: Cardiovascular complication will be avoided Outcome: Progressing   Problem: Activity: Goal: Risk for activity intolerance will decrease Outcome: Progressing   Problem: Nutrition: Goal: Adequate nutrition will be maintained Outcome: Progressing   Problem: Coping: Goal: Level of anxiety will decrease Outcome: Progressing   Problem: Elimination: Goal: Will not experience complications related to bowel motility Outcome: Progressing Goal: Will not experience complications related to urinary retention Outcome:  Progressing   Problem: Pain Managment: Goal: General experience of comfort will improve Outcome: Progressing   Problem: Safety: Goal: Ability to remain free from injury will improve Outcome: Progressing   Problem: Skin Integrity: Goal: Risk for impaired skin integrity will decrease Outcome: Progressing

## 2019-04-19 NOTE — H&P (View-Only) (Signed)
Progress Note  Patient Name: Gabriela Smith Date of Encounter: 04/19/2019  Primary Cardiologist: Minus Breeding, MD   Subjective   No pain.  No SOB.   Inpatient Medications    Scheduled Meds: . aspirin EC  81 mg Oral Daily  . B-complex with vitamin C  1 tablet Oral Daily  . bumetanide  1 mg Oral Daily  . cholecalciferol  1,000 Units Oral Daily  . gabapentin  100 mg Oral QHS  . metoprolol tartrate  50 mg Oral BID  . potassium chloride  40 mEq Oral Daily  . sodium chloride flush  3 mL Intravenous Q12H  . vitamin B-12  500 mcg Oral Daily   Continuous Infusions: . sodium chloride    . sodium chloride    . sodium chloride 10 mL/hr at 04/19/19 0428   PRN Meds: sodium chloride, acetaminophen, acetaminophen, nitroGLYCERIN, ondansetron (ZOFRAN) IV, sodium chloride flush   Vital Signs    Vitals:   04/18/19 2000 04/18/19 2032 04/19/19 0325 04/19/19 0327  BP:  (!) 155/77 (!) 159/85   Pulse:  94 80   Resp:  18 18   Temp: 97.6 F (36.4 C)  97.6 F (36.4 C)   TempSrc: Oral  Oral   SpO2:  97% 99%   Weight:    83.3 kg  Height:        Intake/Output Summary (Last 24 hours) at 04/19/2019 0752 Last data filed at 04/19/2019 0400 Gross per 24 hour  Intake 703 ml  Output 1 ml  Net 702 ml   Last 3 Weights 04/19/2019 04/18/2019 04/17/2019  Weight (lbs) 183 lb 9.6 oz 185 lb 185 lb 6.4 oz  Weight (kg) 83.28 kg 83.915 kg 84.097 kg      Telemetry    NSR, PACs - Personally Reviewed  ECG    NA  Physical Exam   GEN: No  acute distress.   Neck: No  JVD Cardiac: RRR, no murmurs, rubs, or gallops.  Respiratory: Clear   to auscultation bilaterally. GI: Soft, nontender, non-distended, normal bowel sounds  MS:  No edema; No deformity. Neuro:   Nonfocal  Psych: Oriented and appropriate    Labs    High Sensitivity Troponin:   Recent Labs  Lab 04/15/19 1214 04/15/19 1423 04/15/19 2313 04/16/19 0057 04/16/19 0834  TROPONINIHS 156* 167* 340* 493* 440*      Chemistry Recent  Labs  Lab 04/15/19 1822 04/16/19 0508 04/17/19 0205 04/18/19 0314 04/19/19 0410  NA  --    < > 137 139 138  K  --    < > 3.8 3.9 3.8  CL  --    < > 101 104 101  CO2  --    < > 24 24 24   GLUCOSE  --    < > 136* 103* 103*  BUN  --    < > 16 17 16   CREATININE  --    < > 0.94 0.85 0.83  CALCIUM  --    < > 9.0 9.1 9.3  PROT 7.3  --   --   --   --   ALBUMIN 4.0  --   --   --   --   AST 30  --   --   --   --   ALT 45*  --   --   --   --   ALKPHOS 65  --   --   --   --   BILITOT 1.0  --   --   --   --  GFRNONAA  --    < > 57* >60 >60  GFRAA  --    < > >60 >60 >60  ANIONGAP  --    < > 12 11 13    < > = values in this interval not displayed.     Hematology Recent Labs  Lab 04/17/19 0205 04/18/19 0314 04/19/19 0410  WBC 6.6 5.8 5.5  RBC 3.58* 3.68* 3.83*  HGB 11.5* 11.7* 12.0  HCT 33.9* 36.4 37.5  MCV 94.7 98.9 97.9  MCH 32.1 31.8 31.3  MCHC 33.9 32.1 32.0  RDW 13.4 13.5 13.5  PLT 265 276 272    BNP Recent Labs  Lab 04/15/19 1822  BNP 841.1*     DDimer  Recent Labs  Lab 04/15/19 1822  DDIMER 1.24*     Radiology    NM Myocar Multi W/Spect W/Wall Motion / EF  Result Date: 04/18/2019 CLINICAL DATA:  Chest pain. Anginal equivalent. Shortness of breath and weight gain. Suspected diastolic heart failure. EXAM: MYOCARDIAL IMAGING WITH SPECT (REST AND PHARMACOLOGIC-STRESS) GATED LEFT VENTRICULAR WALL MOTION STUDY LEFT VENTRICULAR EJECTION FRACTION TECHNIQUE: Standard myocardial SPECT imaging was performed after resting intravenous injection of 10 mCi Tc-59m tetrofosmin. Subsequently, intravenous infusion of Lexiscan was performed under the supervision of the Cardiology staff. At peak effect of the drug, 30 mCi Tc-75m tetrofosmin was injected intravenously and standard myocardial SPECT imaging was performed. Quantitative gated imaging was also performed to evaluate left ventricular wall motion, and estimate left ventricular ejection fraction. COMPARISON:  Chest CT 11/03/2019  FINDINGS: Perfusion: Moderate-sized, largely fixed perfusion defect involving the left ventricular apex and distal anterolateral and inferolateral walls consistent with scar. Possible mild reversibility along the anterior septal and inferior wall margins consistent with peri-infarct ischemia. Wall Motion: Global hypokinesis, primarily affecting the apex and distal walls. Left Ventricular Ejection Fraction: 40 % End diastolic volume 98 ml End systolic volume 58 ml IMPRESSION: 1. Moderate size apical infarct with possible peri-infarct ischemia along the anterior septal and inferior margins. 2. Apical and global hypokinesis. 3. Left ventricular ejection fraction 40% 4. Non invasive risk stratification*: Intermediate *2012 Appropriate Use Criteria for Coronary Revascularization Focused Update: J Am Coll Cardiol. [id number]. [email or web address]://[email or web address]?[email or web address] Electronically Signed   By: Richardean Sale M.D.   On: 04/18/2019 14:38    Cardiac Studies   Echocardiogram 04/16/2019 IMPRESSIONS   1. Left ventricular ejection fraction, by visual estimation, is 60 to  65%. The left ventricle has normal function. There is no left ventricular  hypertrophy.  2. The left ventricle has no regional wall motion abnormalities.  3. Left ventricular diastolic function could not be evaluated.  4. Global right ventricle has normal systolic function.The right  ventricular size is normal. No increase in right ventricular wall  thickness.  5. Left atrial size was mildly dilated.  6. Right atrial size was normal.  7. Mild calcification of the anterior mitral valve leaflet(s). Mild  thickening of the anterior mitral valve leaflet(s). Moderate mitral  annular calcification. Mild to moderate mitral valve regurgitation. No  evidence of mitral stenosis.  8. The tricuspid valve is normal in structure. Tricuspid valve  regurgitation is mild.  9. The aortic valve was not well visualized.  Aortic valve regurgitation  is not visualized. No evidence of aortic valve sclerosis or stenosis.  10. The pulmonic valve was normal in structure. Pulmonic valve  regurgitation is not visualized.  11. Normal pulmonary artery systolic pressure.  12. The inferior vena cava is normal in size with greater than 50%  respiratory variability, suggesting right atrial pressure of 3 mmHg.  13. Small pericardial effusion.  14. The pericardial effusion is anterior to the right ventricle.  15. There appears to be some degree of aortic stenosis but doppler  interrogation of the AV was not done. Recommend repeat limited study of  the AV to assess for aortic stenosis.   Patient Profile     79 y.o. female with a hx of CAD with PTCA in 1993 and neg nuc in 2017, AAA, Bladder cancer, HLD, HTN and arthritis now presents with SOB, weight gain.  No chest pain.  Troponin 156->167->340->493.  ECG sinus with ectopy.  BNP 841.  Assessment & Plan    Acute diastolic heart failure Seems to be euvolemic.  No change in therapy.   Elevated troponin Plan cath today.  The patient understands that risks included but are not limited to stroke (1 in 1000), death (1 in 17), kidney failure [usually temporary] (1 in 500), bleeding (1 in 200), allergic reaction [possibly serious] (1 in 200).  The patient understands and agrees to proceed.   Paroxysmal atrial fibrillation with RVR, new onset Resume Eliquis after the cath  CAD As above  HLD LDL 185, intolerant to statins and zetia.    She will be referred to the [facility] after discharge.   Hypokalemia  This was supplemented.   Hypertension BP is still running high.  She is very sensitive to meds.  I think she will tolerate low dose Norvasc.  I don't see this listed as an allergy.    For questions or updates, please contact [place] Please consult [email or web address] for contact info under        Signed, Minus Breeding, MD  04/19/2019, 7:52 AM

## 2019-04-19 NOTE — Progress Notes (Signed)
Progress Note  Patient Name: Gabriela Smith Date of Encounter: 04/19/2019  Primary Cardiologist: Minus Breeding, MD   Subjective   No pain.  No SOB.   Inpatient Medications    Scheduled Meds:  aspirin EC  81 mg Oral Daily   B-complex with vitamin C  1 tablet Oral Daily   bumetanide  1 mg Oral Daily   cholecalciferol  1,000 Units Oral Daily   gabapentin  100 mg Oral QHS   metoprolol tartrate  50 mg Oral BID   potassium chloride  40 mEq Oral Daily   sodium chloride flush  3 mL Intravenous Q12H   vitamin B-12  500 mcg Oral Daily   Continuous Infusions:  sodium chloride     sodium chloride     sodium chloride 10 mL/hr at 04/19/19 0428   PRN Meds: sodium chloride, acetaminophen, acetaminophen, nitroGLYCERIN, ondansetron (ZOFRAN) IV, sodium chloride flush   Vital Signs    Vitals:   04/18/19 2000 04/18/19 2032 04/19/19 0325 04/19/19 0327  BP:  (!) 155/77 (!) 159/85   Pulse:  94 80   Resp:  18 18   Temp: 97.6 F (36.4 C)  97.6 F (36.4 C)   TempSrc: Oral  Oral   SpO2:  97% 99%   Weight:    83.3 kg  Height:        Intake/Output Summary (Last 24 hours) at 04/19/2019 0752 Last data filed at 04/19/2019 0400 Gross per 24 hour  Intake 703 ml  Output 1 ml  Net 702 ml   Last 3 Weights 04/19/2019 04/18/2019 04/17/2019  Weight (lbs) 183 lb 9.6 oz 185 lb 185 lb 6.4 oz  Weight (kg) 83.28 kg 83.915 kg 84.097 kg      Telemetry    NSR, PACs - Personally Reviewed  ECG    NA  Physical Exam   GEN: No  acute distress.   Neck: No  JVD Cardiac: RRR, no murmurs, rubs, or gallops.  Respiratory: Clear   to auscultation bilaterally. GI: Soft, nontender, non-distended, normal bowel sounds  MS:  No edema; No deformity. Neuro:   Nonfocal  Psych: Oriented and appropriate    Labs    High Sensitivity Troponin:   Recent Labs  Lab 04/15/19 1214 04/15/19 1423 04/15/19 2313 04/16/19 0057 04/16/19 0834  TROPONINIHS 156* 167* 340* 493* 440*      Chemistry Recent  Labs  Lab 04/15/19 1822 04/16/19 0508 04/17/19 0205 04/18/19 0314 04/19/19 0410  NA  --    < > 137 139 138  K  --    < > 3.8 3.9 3.8  CL  --    < > 101 104 101  CO2  --    < > 24 24 24   GLUCOSE  --    < > 136* 103* 103*  BUN  --    < > 16 17 16   CREATININE  --    < > 0.94 0.85 0.83  CALCIUM  --    < > 9.0 9.1 9.3  PROT 7.3  --   --   --   --   ALBUMIN 4.0  --   --   --   --   AST 30  --   --   --   --   ALT 45*  --   --   --   --   ALKPHOS 65  --   --   --   --   BILITOT 1.0  --   --   --   --  GFRNONAA  --    < > 57* >60 >60  GFRAA  --    < > >60 >60 >60  ANIONGAP  --    < > 12 11 13    < > = values in this interval not displayed.     Hematology Recent Labs  Lab 04/17/19 0205 04/18/19 0314 04/19/19 0410  WBC 6.6 5.8 5.5  RBC 3.58* 3.68* 3.83*  HGB 11.5* 11.7* 12.0  HCT 33.9* 36.4 37.5  MCV 94.7 98.9 97.9  MCH 32.1 31.8 31.3  MCHC 33.9 32.1 32.0  RDW 13.4 13.5 13.5  PLT 265 276 272    BNP Recent Labs  Lab 04/15/19 1822  BNP 841.1*     DDimer  Recent Labs  Lab 04/15/19 1822  DDIMER 1.24*     Radiology    NM Myocar Multi W/Spect W/Wall Motion / EF  Result Date: 04/18/2019 CLINICAL DATA:  Chest pain. Anginal equivalent. Shortness of breath and weight gain. Suspected diastolic heart failure. EXAM: MYOCARDIAL IMAGING WITH SPECT (REST AND PHARMACOLOGIC-STRESS) GATED LEFT VENTRICULAR WALL MOTION STUDY LEFT VENTRICULAR EJECTION FRACTION TECHNIQUE: Standard myocardial SPECT imaging was performed after resting intravenous injection of 10 mCi Tc-64m tetrofosmin. Subsequently, intravenous infusion of Lexiscan was performed under the supervision of the Cardiology staff. At peak effect of the drug, 30 mCi Tc-72m tetrofosmin was injected intravenously and standard myocardial SPECT imaging was performed. Quantitative gated imaging was also performed to evaluate left ventricular wall motion, and estimate left ventricular ejection fraction. COMPARISON:  Chest CT 11/03/2019  FINDINGS: Perfusion: Moderate-sized, largely fixed perfusion defect involving the left ventricular apex and distal anterolateral and inferolateral walls consistent with scar. Possible mild reversibility along the anterior septal and inferior wall margins consistent with peri-infarct ischemia. Wall Motion: Global hypokinesis, primarily affecting the apex and distal walls. Left Ventricular Ejection Fraction: 40 % End diastolic volume 98 ml End systolic volume 58 ml IMPRESSION: 1. Moderate size apical infarct with possible peri-infarct ischemia along the anterior septal and inferior margins. 2. Apical and global hypokinesis. 3. Left ventricular ejection fraction 40% 4. Non invasive risk stratification*: Intermediate *2012 Appropriate Use Criteria for Coronary Revascularization Focused Update: J Am Coll Cardiol. [id number]. [email or web address]://[email or web address] Electronically Signed   By: Richardean Sale M.D.   On: 04/18/2019 14:38    Cardiac Studies   Echocardiogram 04/16/2019 IMPRESSIONS   1. Left ventricular ejection fraction, by visual estimation, is 60 to  65%. The left ventricle has normal function. There is no left ventricular  hypertrophy.  2. The left ventricle has no regional wall motion abnormalities.  3. Left ventricular diastolic function could not be evaluated.  4. Global right ventricle has normal systolic function.The right  ventricular size is normal. No increase in right ventricular wall  thickness.  5. Left atrial size was mildly dilated.  6. Right atrial size was normal.  7. Mild calcification of the anterior mitral valve leaflet(s). Mild  thickening of the anterior mitral valve leaflet(s). Moderate mitral  annular calcification. Mild to moderate mitral valve regurgitation. No  evidence of mitral stenosis.  8. The tricuspid valve is normal in structure. Tricuspid valve  regurgitation is mild.  9. The aortic valve was not well visualized.  Aortic valve regurgitation  is not visualized. No evidence of aortic valve sclerosis or stenosis.  10. The pulmonic valve was normal in structure. Pulmonic valve  regurgitation is not visualized.  11. Normal pulmonary artery systolic pressure.  12. The inferior vena cava is normal in size with greater than 50%  respiratory variability, suggesting right atrial pressure of 3 mmHg.  13. Small pericardial effusion.  14. The pericardial effusion is anterior to the right ventricle.  15. There appears to be some degree of aortic stenosis but doppler  interrogation of the AV was not done. Recommend repeat limited study of  the AV to assess for aortic stenosis.   Patient Profile     79 y.o. female with a hx of CAD with PTCA in 1993 and neg nuc in 2017, AAA, Bladder cancer, HLD, HTN and arthritis now presents with SOB, weight gain.  No chest pain.  Troponin 156->167->340->493.  ECG sinus with ectopy.  BNP 841.  Assessment & Plan    Acute diastolic heart failure Seems to be euvolemic.  No change in therapy.   Elevated troponin Plan cath today.  The patient understands that risks included but are not limited to stroke (1 in 1000), death (1 in 42), kidney failure [usually temporary] (1 in 500), bleeding (1 in 200), allergic reaction [possibly serious] (1 in 200).  The patient understands and agrees to proceed.   Paroxysmal atrial fibrillation with RVR, new onset Resume Eliquis after the cath  CAD As above  HLD LDL 185, intolerant to statins and zetia.    She will be referred to the [facility] after discharge.   Hypokalemia  This was supplemented.   Hypertension BP is still running high.  She is very sensitive to meds.  I think she will tolerate low dose Norvasc.  I don't see this listed as an allergy.    For questions or updates, please contact [place] Please consult [email or web address] for contact info under        Signed, Minus Breeding, MD  04/19/2019, 7:52 AM

## 2019-04-19 NOTE — Progress Notes (Signed)
TCTS consulted for CABG evaluation. °

## 2019-04-19 NOTE — Progress Notes (Signed)
  Echocardiogram 2D Echocardiogram has been performed.  Ceil Roderick A Derrick Tiegs 04/19/2019, 5:19 PM

## 2019-04-19 NOTE — TOC Benefit Eligibility Note (Signed)
Transition of Care [facility]) Benefit Eligibility Note    Patient Details  Name: Gabriela Smith MRN: [id number] Date of Birth: [birth year]   Medication/Dose: Eliquis 2.5mg  bid or 5 mg bid  Covered?: Yes  Tier: 3 Drug  Prescription Coverage Preferred Pharmacy: Any retail Pharmacy  Spoke with Person/Company/Phone Number:: Lee/ [employer]/ [phone]  Co-Pay: 30 day supply 47.00 retail she must paid her 50.00 deductible  131.00 for a 90 day supply mail order also 50.00 deductible must be paid  Prior Approval: No  Deductible: Unmet(50.00)       Orbie Pyo Phone Number: 04/19/2019, 9:19 AM

## 2019-04-19 NOTE — Progress Notes (Signed)
ANTICOAGULATION CONSULT NOTE  Pharmacy Consult for heparin IV Indication: chest pain/ACS  Patient Measurements: Height: 5\' 3"  (160 cm) Weight: 183 lb 9.6 oz (83.3 kg) IBW/kg (Calculated) : 52.4 Heparin Dosing Weight: 69 kg  Vital Signs: Temp: 97.6 F (36.4 C) (02/09 0325) Temp Source: Oral (02/09 0325) BP: 142/72 (02/09 1420) Pulse Rate: 84 (02/09 1420)  Labs: Recent Labs    04/17/19 0205 04/17/19 0205 04/17/19 0951 04/18/19 0314 04/19/19 0410  HGB 11.5*   < >  --  11.7* 12.0  HCT 33.9*  --   --  36.4 37.5  PLT 265  --   --  276 272  HEPARINUNFRC 0.39  --  0.60 0.55  --   CREATININE 0.94  --   --  0.85 0.83   < > = values in this interval not displayed.    Estimated Creatinine Clearance: 55.3 mL/min (by C-G formula based on SCr of 0.83 mg/dL).  Assessment: 79 y/o F presenting with shortness of breath. PMH is extensive but significant for h/o bladder cancer, CAD, and abdominal aortic aneurysm from (2017). Pharmacy has been consulted to dose heparin IV infusion for suspected ACS/STEMI. She is also noted with new onset afib.  Cath this morning showed multivessel CAD, apixaban placed on hold (given last night). Will start heparin this afternoon in anticipation of surgery.   Goal of Therapy:  aptt goal 66-102s Heparin level 0.3-0.7 units/ml Monitor platelets by anticoagulation protocol: Yes    Plan:  Heparin to start at 1200 units/hr tonight 8 hour aptt/HL Follow up surgical plans  Erin Hearing PharmD., BCPS Clinical Pharmacist 04/19/2019 2:53 PM

## 2019-04-19 NOTE — Interval H&P Note (Signed)
History and Physical Interval Note:  04/19/2019 12:37 PM  Gabriela Smith  has presented today for surgery, with the diagnosis of nstemi.  The various methods of treatment have been discussed with the patient and family. After consideration of risks, benefits and other options for treatment, the patient has consented to  Procedure(s): LEFT HEART CATH AND CORONARY ANGIOGRAPHY (N/A)  PERCUTANEOUS CORONARY INTERVENTION   as a surgical intervention.  The patient's history has been reviewed, patient examined, no change in status, stable for surgery.  I have reviewed the patient's chart and labs.  Questions were answered to the patient's satisfaction.   Cath Lab Visit (complete for each Cath Lab visit)  Clinical Evaluation Leading to the Procedure:   ACS: Yes.    Non-ACS:    Anginal Classification: CCS IV  Anti-ischemic medical therapy: Minimal Therapy (1 class of medications)  Non-Invasive Test Results: No non-invasive testing performed  Prior CABG: No previous CABG  Glenetta Hew

## 2019-04-19 NOTE — Consult Note (Signed)
[address]       [place] [address]             [phone]        Aylinn T Glaeser [place] Medical Record [id number] Date of Birth: 1940-09-14  Referring: No ref. provider found Primary Care: Baruch Gouty, FNP Primary Cardiologist:James Percival Spanish, MD  Chief Complaint:    Chief Complaint  Patient presents with  . Shortness of Breath    History of Present Illness:     79 yo female with hx of CAD s/p PCI to LAD in 1993, HTN, and HLP is admitted on 2/5 with new onset dyspnea, and weight gain.  During her hospitalization she developed afib with RVR, and a troponin leak.  She was taken to the cath lab today where she was noted to have severe 3V CAD.  Prior to this hospitalization, she denies any anginal symptoms.  She had a stress test in 2017, which was negative.  From a functional standoint, she takes care of her 17yo husband who also has Parkinson's disease.     Past Medical and Surgical History: Previous Chest Surgery: no Previous Chest Radiation: no Diabetes Mellitus: no.  HbA1C 5.7 Creatinine: 0.83  Past Medical History:  Diagnosis Date  . AAA (abdominal aortic aneurysm) [facility])    cardiology aware   . Arthritis    OA lt knee- cortizone inj. q 4 months AND OA ALSO IN BACK. Had knee replaced-issue resolved  . CAD (coronary artery disease) CARDIOLOGIST- DR [facility]--  VISIT 01-02-10 IN EPIC   1993-- PTCA. Pt describes a near total blockage of apparently the LAD and a 65% stenosis elsewhere.  . Cancer ([place])   . History of bladder cancer followed by dr Karsten Ro   hx  TCC of bladder ,  Ta G3-  first occurence 02-20-2012--  s/p BCG tx's  and  mitomycin C  . Hyperlipidemia 1993  . Hypertension 1993  . Nocturia   . Osteopenia   . Sigmoid diverticulosis     Past Surgical History:  Procedure Laterality Date  . CHOLECYSTECTOMY  2003 (approx)  . COLONOSCOPY  05-31-2003  . CORONARY ANGIOPLASTY  1993   to LAD  . CYSTOSCOPY WITH BIOPSY  05/09/2011    Procedure: CYSTOSCOPY WITH BIOPSY;  Surgeon: Claybon Jabs, MD;  Location: [facility];  Service: Urology;  Laterality: N/A;  WITH BLADDER BIOPSY GYRUS  . TOTAL HIP ARTHROPLASTY Right 12/25/2015   Procedure: RIGHT TOTAL HIP ARTHROPLASTY ANTERIOR APPROACH;  Surgeon: Paralee Cancel, MD;  Location: [facility];  Service: Orthopedics;  Laterality: Right;  . TOTAL KNEE ARTHROPLASTY Left 11/22/2012   Procedure: LEFT TOTAL KNEE ARTHROPLASTY;  Surgeon: Mauri Pole, MD;  Location: [facility];  Service: Orthopedics;  Laterality: Left;  . TRANSURETHRAL RESECTION OF BLADDER TUMOR  03/14/2011   Procedure: TRANSURETHRAL RESECTION OF BLADDER TUMOR (TURBT);  Surgeon: Claybon Jabs, MD;  Location: [facility];  Service: Urology;  Laterality: N/A;  . TRANSURETHRAL RESECTION OF BLADDER TUMOR  05/09/2011   Procedure: TRANSURETHRAL RESECTION OF BLADDER TUMOR (TURBT);  Surgeon: Claybon Jabs, MD;  Location: [facility];  Service: Urology;  Laterality: N/A;  . TRANSURETHRAL RESECTION OF BLADDER TUMOR  03/08/2012   Procedure: TRANSURETHRAL RESECTION OF BLADDER TUMOR (TURBT);  Surgeon: Claybon Jabs, MD;  Location: [facility];  Service: Urology;  Laterality: N/A;  . TRANSURETHRAL RESECTION OF BLADDER TUMOR WITH GYRUS (TURBT-GYRUS) N/A 03/24/2014  Procedure: TRANSURETHRAL RESECTION OF BLADDER TUMOR WITH GYRUS (TURBT-GYRUS);  Surgeon: Claybon Jabs, MD;  Location[facility];  Service: Urology;  Laterality: N/A;  . UMBILICAL HERNIA REPAIR  2001  (approx)    Social History: Support: lives in apartment above daughters garage.  Main caregiver for her husband  Social History   Tobacco Use  Smoking Status Former Smoker  . Packs/day: 0.50  . Years: 5.00  . Pack years: 2.50  . Types: Cigarettes  . Quit date: 03/09/1976  . Years since quitting: 43.1  Smokeless Tobacco Never Used    Social History   Substance and Sexual Activity  Alcohol Use Yes  .  Alcohol/week: 5.0 standard drinks  . Types: 5 Glasses of wine per week   Comment: red wine     Allergies  Allergen Reactions  . Fish Allergy Hives, Shortness Of Breath and Other (See Comments)    "Bottom-feeder fish"  . Fish-Derived Products Hives, Shortness Of Breath and Other (See Comments)    Bottom-feeder fish  . Shellfish-Derived Products Hives and Shortness Of Breath  . Aspirin Other (See Comments)    Internal bleeding  . Lasix [Furosemide] Swelling and Other (See Comments)    Edema, elevated BP  . Macrodantin [Nitrofurantoin Macrocrystal] Other (See Comments)    Unknown reaction   . Vicodin [Hydrocodone-Acetaminophen] Nausea And Vomiting and Other (See Comments)    SEVERE N/V  . Zetia [Ezetimibe] Other (See Comments)    Muscle aches  . Codeine Rash  . Latex Itching and Rash    Reports Elastic in underwear, under wire bras, and now surgical cap cause rash and itching.   . Niacin Other (See Comments)    Muscle aches  . Niaspan [Niacin Er] Other (See Comments)    Muscle aches  . Statins Other (See Comments)    Muscle aches/ cramps  . Sulfa Antibiotics Rash and Other (See Comments)    Muscle aches/ cramps, also    Medications: Asprin: yes Statin: no Beta Blocker: yes Ace Inhibitor: no Anti-Coagulation: no  Current Facility-Administered Medications  Medication Dose Route Frequency Provider Last Rate Last Admin  . 0.9 %  sodium chloride infusion   Intravenous Continuous Leonie Man, MD      . 0.9 %  sodium chloride infusion   Intravenous Continuous Leonie Man, MD      . 0.9 %  sodium chloride infusion  250 mL Intravenous PRN Leonie Man, MD      . acetaminophen (TYLENOL) tablet 650 mg  650 mg Oral Q4H PRN Leonie Man, MD   650 mg at 04/15/19 2025  . amLODipine (NORVASC) tablet 2.5 mg  2.5 mg Oral Daily Leonie Man, MD   2.5 mg at 04/19/19 0820  . aspirin EC tablet 81 mg  81 mg Oral Daily Leonie Man, MD   81 mg at 04/19/19 [id number]  .  B-complex with vitamin C tablet 1 tablet  1 tablet Oral Daily Leonie Man, MD   1 tablet at 04/19/19 [id number]  . bumetanide (BUMEX) tablet 1 mg  1 mg Oral Daily Leonie Man, MD   1 mg at 04/19/19 1035  . cholecalciferol (VITAMIN D3) tablet 1,000 Units  1,000 Units Oral Daily Leonie Man, MD   1,000 Units at 04/19/19 0820  . gabapentin (NEURONTIN) capsule 100 mg  100 mg Oral QHS Leonie Man, MD   100 mg at 04/16/19 2030  . heparin ADULT infusion 100 units/mL (25000  units/272mL sodium chloride 0.45%)  1,200 Units/hr Intravenous Continuous Lyndee Leo, RPH      . hydrALAZINE (APRESOLINE) injection 10 mg  10 mg Intravenous Q20 Min PRN Leonie Man, MD      . labetalol (NORMODYNE) injection 10 mg  10 mg Intravenous Q10 min PRN Leonie Man, MD      . metoprolol tartrate (LOPRESSOR) tablet 50 mg  50 mg Oral BID Leonie Man, MD   50 mg at 04/19/19 [id number]  . nitroGLYCERIN (NITROSTAT) SL tablet 0.4 mg  0.4 mg Sublingual Q5 Min x 3 PRN Leonie Man, MD      . ondansetron [facility]) injection 4 mg  4 mg Intravenous Q6H PRN Leonie Man, MD      . potassium chloride SA (KLOR-CON) CR tablet 40 mEq  40 mEq Oral Daily Leonie Man, MD   40 mEq at 04/18/19 1050  . sodium chloride flush (NS) 0.9 % injection 3 mL  3 mL Intravenous Q12H Leonie Man, MD   3 mL at 04/18/19 2011  . sodium chloride flush (NS) 0.9 % injection 3 mL  3 mL Intravenous Q12H Leonie Man, MD      . sodium chloride flush (NS) 0.9 % injection 3 mL  3 mL Intravenous PRN Leonie Man, MD      . vitamin B-12 (CYANOCOBALAMIN) tablet 500 mcg  500 mcg Oral Daily Leonie Man, MD   500 mcg at 04/19/19 0820   Facility-Administered Medications Ordered in Other Encounters  Medication Dose Route Frequency Provider Last Rate Last Admin  . DOBUTamine (DOBUTREX) 1,000 mcg/mL in dextrose 5% 250 mL infusion  20 mcg/kg/min Intravenous Continuous Dorothy Spark, MD 96.4 mL/hr at 12/24/15 0840 20  mcg/kg/min at 12/24/15 0840    Medications Prior to Admission  Medication Sig Dispense Refill Last Dose  . acetaminophen (TYLENOL) 500 MG tablet Take 2 tablets (1,000 mg total) by mouth every 8 (eight) hours. (Patient taking differently: Take 1,000 mg by mouth at bedtime as needed for headache (pain/sleep). ) 30 tablet 0 04/14/2019 at pm  . b complex vitamins tablet Take 1 tablet by mouth daily after breakfast.    04/14/2019 at am  . betamethasone valerate (VALISONE) 0.1 % cream Apply 1 application topically at bedtime. Apply externally to vaginal area   04/14/2019 at pm  . cholecalciferol (VITAMIN D) 1000 UNITS tablet Take 1,000 Units by mouth daily after breakfast.    04/14/2019 at am  . clindamycin (CLEOCIN) 2 % vaginal cream Place 1 Applicatorful vaginally at bedtime.   04/14/2019 at pm  . Co-Enzyme Q10 200 MG CAPS Take 200 mg by mouth daily after breakfast.    04/14/2019 at am  . Flaxseed, Linseed, (FLAX SEED OIL) 1000 MG CAPS Take 1,000 mg by mouth daily after breakfast.    04/14/2019 at am  . gabapentin (NEURONTIN) 100 MG capsule Take 1 capsule (100 mg total) by mouth at bedtime. 90 capsule 3 04/13/2019  . hydrochlorothiazide (MICROZIDE) 12.5 MG capsule TAKE (1) CAPSULE TWICE DAILY. (Patient taking differently: Take 12.5 mg by mouth 2 (two) times daily. ) 180 capsule 3 04/15/2019 at am  . Magnesium 300 MG CAPS Take 300 mg by mouth at bedtime.    04/14/2019 at pm  . metoprolol tartrate (LOPRESSOR) 50 MG tablet TAKE (1) TABLET TWICE A DAY. (Patient taking differently: Take 50 mg by mouth 2 (two) times daily. TAKE (1) TABLET TWICE A DAY.) 180 tablet 3 04/15/2019 at 0730  .  nitroGLYCERIN (NITROSTAT) 0.4 MG SL tablet PLACE 1 TABLET UNDER THE TONGUE AT ONSET OF CHEST PAIN EVERY 5 MINTUES UP TO 3 TIMES AS NEEDED (Patient taking differently: Place 0.4 mg under the tongue every 5 (five) minutes as needed for chest pain. ) 25 tablet 0 04/14/2019 at 2200  . OVER THE COUNTER MEDICATION Take 1 tablet by mouth daily after  breakfast. Elderberry/Vitamin C   04/14/2019 at am  . TURMERIC PO Take 1 capsule by mouth daily after breakfast.   04/14/2019 at am    Family History  Problem Relation Age of Onset  . Coronary artery disease Father 15  . Hyperlipidemia Father   . Heart attack Mother 78  . Hyperlipidemia Mother   . Osteoporosis Sister   . Hyperlipidemia Sister   . Heart attack Daughter 39       CABG x 5  . Heart disease Son 58       Stents x 3     Review of Systems:   Review of Systems  Constitutional: Negative for fever and malaise/fatigue.  HENT: Negative.   Respiratory: Positive for shortness of breath. Negative for cough.   Cardiovascular: Positive for chest pain and leg swelling.  Gastrointestinal: Negative.   Musculoskeletal: Positive for joint pain.  Skin: Negative.   Neurological: Negative.       Physical Exam: BP (!) 142/72   Pulse 84   Temp 98.6 F (37 C) (Oral)   Resp 16   Ht 5\' 3"  (1.6 m)   Wt 83.3 kg   SpO2 95%   BMI 32.52 kg/m  Physical Exam  Constitutional: She is oriented to person, place, and time and well-developed, well-nourished, and in no distress. No distress.  Appears younger than stated age  HENT:  Head: Normocephalic and atraumatic.  Eyes: EOM are normal. No scleral icterus.  Neck: No tracheal deviation present.  Cardiovascular: Normal rate, regular rhythm and normal heart sounds.  No murmur heard. Pulmonary/Chest: Effort normal and breath sounds normal. No respiratory distress. She has no wheezes.  Abdominal: Soft. She exhibits no distension.  Musculoskeletal:        General: Normal range of motion.     Cervical back: Normal range of motion.  Neurological: She is alert and oriented to person, place, and time.  Skin: Skin is warm and dry. She is not diaphoretic.      Diagnostic Studies & Laboratory data:    Left Heart Catherization: Small LAD, LM equivalent disease.  Good circ, and PDA Echo: nml EF, calcified aortic valve.  Likely moderate AS    I have independently reviewed the above radiologic studies and discussed with the patient   Recent Lab Findings: Lab Results  Component Value Date   WBC 5.5 04/19/2019   HGB 12.0 04/19/2019   HCT 37.5 04/19/2019   PLT 272 04/19/2019   GLUCOSE 103 (H) 04/19/2019   CHOL 227 (H) 04/16/2019   TRIG 131 04/16/2019   HDL 59 04/16/2019   LDLDIRECT 193 (H) 10/21/2016   LDLCALC 142 (H) 04/16/2019   ALT 45 (H) 04/15/2019   AST 30 04/15/2019   NA 138 04/19/2019   K 3.8 04/19/2019   CL 101 04/19/2019   CREATININE 0.83 04/19/2019   BUN 16 04/19/2019   CO2 24 04/19/2019   TSH 2.826 04/15/2019   INR 0.84 12/19/2015   HGBA1C 5.7 (H) 04/15/2019      Assessment / Plan:    STS Risk Calculation for Isolated CABG Risk of Mortality: 6.714% Renal Failure:  2.918% Permanent Stroke: 2.172% Prolonged Ventilation: 14.270% DSW Infection: 0.425% Reoperation: 2.464% Morbidity or Mortality: 19.833% Short Length of Stay: 17.417% Long Length of Stay: 10.833%  STS Risk Calculation for CABG + AVR Risk of Mortality: 7.587% Renal Failure: 5.605% Permanent Stroke: 3.638% Prolonged Ventilation: 20.796% DSW Infection: 0.532% Reoperation: 4.288% Morbidity or Mortality: 28.390% Short Length of Stay: 9.866% Long Length of Stay: 18.639%  22 female with 3V CAD, LM equivalent, moderate aortic stenosis, and paroxysymal atrial fibrillation.  She presents with dyspnea, which is now improved with diuresis.  I discussed several options with her and her family, which included CABG/atriclip/AVR, vs CABG/atriclip followed by staged TAVR.  She stated that she did not want to undergo surgery at this point, given that she does not have many limitations from her CAD.  I further explained, that given her disease, if she chose not to undergo surgical revascularization, she is at risk of having a major heart attack.  She however remained resolute in her decision, and would like to be optimized medically.    Can follow-up  in 1 month for further discussion         INETA KIRKLAND 04/19/2019 4:38 PM

## 2019-04-20 ENCOUNTER — Other Ambulatory Visit: Payer: Self-pay

## 2019-04-20 DIAGNOSIS — I214 Non-ST elevation (NSTEMI) myocardial infarction: Principal | ICD-10-CM

## 2019-04-20 LAB — BASIC METABOLIC PANEL
Anion gap: 13 (ref 5–15)
BUN: 16 mg/dL (ref 8–23)
CO2: 21 mmol/L — ABNORMAL LOW (ref 22–32)
Calcium: 9.5 mg/dL (ref 8.9–10.3)
Chloride: 104 mmol/L (ref 98–111)
Creatinine, Ser: 0.8 mg/dL (ref 0.44–1.00)
GFR calc Af Amer: >60 mL/min (ref >60)
GFR calc non Af Amer: >60 mL/min (ref >60)
Glucose, Bld: 118 mg/dL — ABNORMAL HIGH (ref 70–99)
Potassium: 3.8 mmol/L (ref 3.5–5.1)
Sodium: 138 mmol/L (ref 135–145)

## 2019-04-20 LAB — CBC
HCT: 39.9 % (ref 36.0–46.0)
Hemoglobin: 13 g/dL (ref 12.0–15.0)
MCH: 31.6 pg (ref 26.0–34.0)
MCHC: 32.6 g/dL (ref 30.0–36.0)
MCV: 96.8 fL (ref 80.0–100.0)
Platelets: 302 10*3/uL (ref 150–400)
RBC: 4.12 MIL/uL (ref 3.87–5.11)
RDW: 13.8 % (ref 11.5–15.5)
WBC: 6.9 10*3/uL (ref 4.0–10.5)
nRBC: 0 % (ref 0.0–0.2)

## 2019-04-20 LAB — APTT: aPTT: 124 seconds — ABNORMAL HIGH (ref 24–36)

## 2019-04-20 LAB — HEPARIN LEVEL (UNFRACTIONATED): Heparin Unfractionated: 0.98 IU/mL — ABNORMAL HIGH (ref 0.30–0.70)

## 2019-04-20 MED ORDER — METOPROLOL TARTRATE 25 MG PO TABS
25.0000 mg | ORAL_TABLET | Freq: Once | ORAL | Status: AC
  Administered 2019-04-20: 25 mg via ORAL
  Filled 2019-04-20: qty 1

## 2019-04-20 MED ORDER — METOPROLOL SUCCINATE ER 50 MG PO TB24
150.0000 mg | ORAL_TABLET | Freq: Every day | ORAL | Status: DC
  Administered 2019-04-20: 150 mg via ORAL
  Filled 2019-04-20: qty 3

## 2019-04-20 MED ORDER — CHLORHEXIDINE GLUCONATE CLOTH 2 % EX PADS: 6.0000 | MEDICATED_PAD | Freq: Every day | CUTANEOUS | Status: DC

## 2019-04-20 MED ORDER — APIXABAN 5 MG PO TABS
5.0000 mg | ORAL_TABLET | Freq: Two times a day (BID) | ORAL | Status: DC
  Administered 2019-04-20 (×2): 5 mg via ORAL
  Filled 2019-04-20 (×2): qty 1

## 2019-04-20 NOTE — Progress Notes (Signed)
Notified pt in afib w/ RVR. She is on metoprolol 50mg  bid with a dose planned in a few hours later this AM. Will give one-time 25mg  PO additional metoprolol dose at this time. Will leave standing metoprolol dose as 50mg  bid; she will receive planned standing 50mg  dose later this AM. Rudean Curt, MD , [facility] 2:44 AM

## 2019-04-20 NOTE — Progress Notes (Signed)
   Spoke in depth with patient about discharge plans. We initially arranged close follow up with Dr. Kipp Brood on 04/27/19. She has concerns about transportation as she lives an hour away and does not drive. We discussed how staying in the hospital will be the safest and easiest option for her given the above issues and the extent of her CAD. She is very concerned about getting a shower and feels that if this is possible, she would be willing to stay for surgical revascularization. This has been arranged. I have personally spoken to Dr. Kipp Brood who will be by later today to further discuss as she can possibly be scheduled as early as Monday morning. Dr. Percival Spanish notified as well.    Kathyrn Drown NP-C [place] Pager: [phone]

## 2019-04-20 NOTE — Progress Notes (Addendum)
CARDIAC REHAB PHASE I   PRE:  Rate/Rhythm: 84 SR with PACs    BP: sitting 150/74    SaO2: 97 RA  MODE:  Ambulation: 290 ft   POST:  Rate/Rhythm: 107 ST with PACs    BP: sitting 130/69     SaO2: 97 RA  Pt anxious regarding decision with OHS. Long discussion of sternal precautions, mobility post op, d/c planning. She has help at home and is generally mobile. She did get fatigued with slow walk around 2/3 unit. HR with PACs. No overt CP. Discussed MI, OHS booklet, Move in the Tube. Gave her IS and she is currently performing 1200 mL. She is calling her family now to discuss OHS. I discussed only doing light activity at home due to blockages. Will not give exercise guidelines or refer to CRPII (LM, needs OHS). [place], ACSM 04/20/2019 11:11 AM

## 2019-04-20 NOTE — Progress Notes (Signed)
Per Dr. Kipp Brood, patient can follow up with him in office next week if being DC by cardiology. [place], Ardeth Sportsman

## 2019-04-20 NOTE — Progress Notes (Signed)
Progress Note  Patient Name: Gabriela Smith Date of Encounter: 04/20/2019  Primary Cardiologist: Minus Breeding, MD   Subjective   The patient has had no further chest pain.  No SOB.  She does not feel atrial fib when she goes into this rhythm  Inpatient Medications    Scheduled Meds: . amLODipine  2.5 mg Oral Daily  . aspirin EC  81 mg Oral Daily  . B-complex with vitamin C  1 tablet Oral Daily  . bumetanide  1 mg Oral Daily  . Chlorhexidine Gluconate Cloth  6 each Topical Daily  . cholecalciferol  1,000 Units Oral Daily  . gabapentin  100 mg Oral QHS  . metoprolol tartrate  50 mg Oral BID  . mupirocin ointment  1 application Nasal BID  . potassium chloride  40 mEq Oral Daily  . sodium chloride flush  3 mL Intravenous Q12H  . sodium chloride flush  3 mL Intravenous Q12H  . vitamin B-12  500 mcg Oral Daily   Continuous Infusions: . sodium chloride    . sodium chloride    . heparin 1,200 Units/hr (04/20/19 0800)   PRN Meds: sodium chloride, acetaminophen, lip balm, nitroGLYCERIN, ondansetron (ZOFRAN) IV, sodium chloride flush   Vital Signs    Vitals:   04/20/19 0600 04/20/19 0630 04/20/19 0700 04/20/19 0800  BP: 101/67 108/85    Pulse: (!) 133 (!) 128 (!) 112 (!) 105  Resp: 17 17 (!) 22 (!) 23  Temp:      TempSrc:      SpO2: 93% 96% 97% 98%  Weight:      Height:        Intake/Output Summary (Last 24 hours) at 04/20/2019 0907 Last data filed at 04/20/2019 0800 Gross per 24 hour  Intake 359.23 ml  Output 650 ml  Net -290.77 ml   Last 3 Weights 04/20/2019 04/19/2019 04/18/2019  Weight (lbs) 182 lb 1.6 oz 183 lb 9.6 oz 185 lb  Weight (kg) 82.6 kg 83.28 kg 83.915 kg      Telemetry    NSR, atrial fib paroxyms - Personally Reviewed  ECG    NA  Physical Exam   GEN: No  acute distress.   Neck: No  JVD Cardiac: RRR, soft apical systolic murmurs, rubs, or gallops.  Respiratory: Clear   to auscultation bilaterally. GI: Soft, nontender, non-distended, normal  bowel sounds  MS:  Trace edema; No deformity.  Right radial access site without bruising or bleeding.  Neuro:   Nonfocal  Psych: Oriented and appropriate    Labs    High Sensitivity Troponin:   Recent Labs  Lab 04/15/19 1214 04/15/19 1423 04/15/19 2313 04/16/19 0057 04/16/19 0834  TROPONINIHS 156* 167* 340* 493* 440*      Chemistry Recent Labs  Lab 04/15/19 1822 04/16/19 0508 04/18/19 0314 04/19/19 0410 04/20/19 0645  NA  --    < > 139 138 138  K  --    < > 3.9 3.8 3.8  CL  --    < > 104 101 104  CO2  --    < > 24 24 21*  GLUCOSE  --    < > 103* 103* 118*  BUN  --    < > 17 16 16   CREATININE  --    < > 0.85 0.83 0.80  CALCIUM  --    < > 9.1 9.3 9.5  PROT 7.3  --   --   --   --  ALBUMIN 4.0  --   --   --   --   AST 30  --   --   --   --   ALT 45*  --   --   --   --   ALKPHOS 65  --   --   --   --   BILITOT 1.0  --   --   --   --   GFRNONAA  --    < > >60 >60 >60  GFRAA  --    < > >60 >60 >60  ANIONGAP  --    < > 11 13 13    < > = values in this interval not displayed.     Hematology Recent Labs  Lab 04/18/19 0314 04/19/19 0410 04/20/19 0645  WBC 5.8 5.5 6.9  RBC 3.68* 3.83* 4.12  HGB 11.7* 12.0 13.0  HCT 36.4 37.5 39.9  MCV 98.9 97.9 96.8  MCH 31.8 31.3 31.6  MCHC 32.1 32.0 32.6  RDW 13.5 13.5 13.8  PLT 276 272 302    BNP Recent Labs  Lab 04/15/19 1822  BNP 841.1*     DDimer  Recent Labs  Lab 04/15/19 1822  DDIMER 1.24*     Radiology    CARDIAC CATHETERIZATION  Result Date: 04/19/2019  LEFT CORONARY ARTERY  Ost LM lesion is 40% stenosed. Mid LM to Ost LAD lesion is 85% stenosed with 95% stenosed side branch in Ost Cx.  Prox LAD to Mid LAD lesion is 90% stenosed -proximal to 1st Diag; Mid LAD lesion is 80% stenosed after 1st Diag  1st Diag lesion is 10% stenosed with 55% stenosed side branch in Lat 1st Diag.  Prox Cx to Mid Cx lesion is 35% stenosed.  RIGHT CORONARY ARTERY  Ost RCA to Prox RCA lesion is 60% stenosed. Prox RCA  lesion is 70% stenosed.  Mid RCA to Dist RCA lesion is 90% stenosed.  Dist RCA lesion is 20% stenosed with 50% stenosed side branch in RPAV. RPAV lesion is 65% stenosed. RPDA lesion is 50% stenosed.  -------------------  LV end diastolic pressure is normal.  SUMMARY  Severe multivessel disease with mild ostial and severe distal LEFT MAIN disease involving ostial LCx with severe proximal and mid LAD disease as well as ostial and mid RCA diffuse disease.  Normal LVEDP with normal EF by Echocardiogram RECOMMENDATIONS  Patient be transferred to [facility] and we will consult CVTS for LEFT MAIN disease  She is hemodynamically stable, therefore would not place IABP, however she has recurrent chest pain, will need urgent IVP and possible urgent CABG  We will start IV heparin 8 hours after sheath removal.  Eliquis will not be restarted.  Continue other cardiac risk factor management with blood pressure, and lipid control.  She does appear to be adequately diuresed based on LVEDP Glenetta Hew, M.D., M.S. Interventional Cardiologist   NM Myocar Multi W/Spect W/Wall Motion / EF  Result Date: 04/18/2019 CLINICAL DATA:  Chest pain. Anginal equivalent. Shortness of breath and weight gain. Suspected diastolic heart failure. EXAM: MYOCARDIAL IMAGING WITH SPECT (REST AND PHARMACOLOGIC-STRESS) GATED LEFT VENTRICULAR WALL MOTION STUDY LEFT VENTRICULAR EJECTION FRACTION TECHNIQUE: Standard myocardial SPECT imaging was performed after resting intravenous injection of 10 mCi Tc-67m tetrofosmin. Subsequently, intravenous infusion of Lexiscan was performed under the supervision of the Cardiology staff. At peak effect of the drug, 30 mCi Tc-26m tetrofosmin was injected intravenously and standard myocardial SPECT imaging was performed. Quantitative gated imaging was also performed to  evaluate left ventricular wall motion, and estimate left ventricular ejection fraction. COMPARISON:  Chest CT 11/03/2019 FINDINGS: Perfusion:  Moderate-sized, largely fixed perfusion defect involving the left ventricular apex and distal anterolateral and inferolateral walls consistent with scar. Possible mild reversibility along the anterior septal and inferior wall margins consistent with peri-infarct ischemia. Wall Motion: Global hypokinesis, primarily affecting the apex and distal walls. Left Ventricular Ejection Fraction: 40 % End diastolic volume 98 ml End systolic volume 58 ml IMPRESSION: 1. Moderate size apical infarct with possible peri-infarct ischemia along the anterior septal and inferior margins. 2. Apical and global hypokinesis. 3. Left ventricular ejection fraction 40% 4. Non invasive risk stratification*: Intermediate *2012 Appropriate Use Criteria for Coronary Revascularization Focused Update: J Am Coll Cardiol. [id number]. http://content.[email or web address]?[email or web address] Electronically Signed   By: Richardean Sale M.D.   On: 04/18/2019 14:38   VAS US DOPPLER PRE CABG  Result Date: 04/20/2019 PREOPERATIVE VASCULAR EVALUATION  Indications: Pre-CABG. Performing Technologist: bynum, [name] RVT Carlos Levering Rvt  Examination Guidelines: A complete evaluation includes B-mode imaging, spectral Doppler, color Doppler, and power Doppler as needed of all accessible portions of each vessel. Bilateral testing is considered an integral part of a complete examination. Limited examinations for reoccurring indications may be performed as noted.  Right Carotid Findings: +----------+--------+--------+--------+--------------------+-------------------+           PSV cm/sEDV cm/sStenosisDescribe            Comments            +----------+--------+--------+--------+--------------------+-------------------+ CCA Prox  93      20                                                      +----------+--------+--------+--------+--------------------+-------------------+ CCA Distal100     22              heterogenous         intimal thickening  +----------+--------+--------+--------+--------------------+-------------------+ ICA Prox  111     30              hyperechoic and                                                           calcific                                +----------+--------+--------+--------+--------------------+-------------------+ ICA Mid   120     40                                  upper limits of                                                           normal              +----------+--------+--------+--------+--------------------+-------------------+ ICA Distal137     38                                                      +----------+--------+--------+--------+--------------------+-------------------+  ECA       228     14              heterogenous                            +----------+--------+--------+--------+--------------------+-------------------+ Portions of this table do not appear on this page. +----------+--------+-------+--------+------------+           PSV cm/sEDV cmsDescribeArm Pressure +----------+--------+-------+--------+------------+ Subclavian118                                 +----------+--------+-------+--------+------------+ +---------+--------+--+--------+--+---------+ VertebralPSV cm/s37EDV cm/s10Antegrade +---------+--------+--+--------+--+---------+ Left Carotid Findings: +----------+--------+--------+--------+---------------------+------------------+           PSV cm/sEDV cm/sStenosisDescribe             Comments           +----------+--------+--------+--------+---------------------+------------------+ CCA Prox  87      20                                                      +----------+--------+--------+--------+---------------------+------------------+ CCA Distal117     23                                                       +----------+--------+--------+--------+---------------------+------------------+ ICA Prox  111     23              calcific, diffuse andintimal thickening                                   heterogenous                            +----------+--------+--------+--------+---------------------+------------------+ ICA Distal68      28                                                      +----------+--------+--------+--------+---------------------+------------------+ ECA       187     17              heterogenous                            +----------+--------+--------+--------+---------------------+------------------+ +----------+--------+--------+--------+------------+ SubclavianPSV cm/sEDV cm/sDescribeArm Pressure +----------+--------+--------+--------+------------+           140                     170          +----------+--------+--------+--------+------------+ +---------+--------+--+--------+--+---------+ VertebralPSV cm/s44EDV cm/s10Antegrade +---------+--------+--+--------+--+---------+  ABI Findings: +--------+------------------+-----+--------+--------+ Right   Rt Pressure (mmHg)IndexWaveformComment  +--------+------------------+-----+--------+--------+ Brachial                                        +--------+------------------+-----+--------+--------+ PTA  128               0.75                  +--------+------------------+-----+--------+--------+ DP      120               0.71                  +--------+------------------+-----+--------+--------+ +--------+------------------+-----+---------+-------+ Left    Lt Pressure (mmHg)IndexWaveform Comment +--------+------------------+-----+---------+-------+ Brachial170                    triphasic        +--------+------------------+-----+---------+-------+ PTA     166               0.98                  +--------+------------------+-----+---------+-------+ DP      158                0.93                  +--------+------------------+-----+---------+-------+ +-------+---------------+----------------+ ABI/TBIToday's ABI/TBIPrevious ABI/TBI +-------+---------------+----------------+ Right  0.75                            +-------+---------------+----------------+ Left   0.98                            +-------+---------------+----------------+  Right Doppler Findings: +--------+--------+-----+-------+--------+ Site    PressureIndexDopplerComments +--------+--------+-----+-------+--------+ Brachial                             +--------+--------+-----+-------+--------+ Radial                               +--------+--------+-----+-------+--------+ Ulnar                                +--------+--------+-----+-------+--------+  Left Doppler Findings: +--------+--------+-----+---------+--------+ Site    PressureIndexDoppler  Comments +--------+--------+-----+---------+--------+ Brachial170          triphasic         +--------+--------+-----+---------+--------+ Radial               triphasic         +--------+--------+-----+---------+--------+ Ulnar                triphasic         +--------+--------+-----+---------+--------+ Technologist Notes: TR band, unable to assess at time of exam.  Summary: Right Carotid: Velocities in the right ICA are consistent with a 1-39% stenosis. Left Carotid: Velocities in the left ICA are consistent with a 1-39% stenosis. Vertebrals: Bilateral vertebral arteries demonstrate antegrade flow. Right ABI: Resting right ankle-brachial index indicates moderate right lower extremity arterial disease. Left ABI: Resting left ankle-brachial index is within normal range. No evidence of significant left lower extremity arterial disease. Left Upper Extremity: Doppler waveform obliterate with left radial compression. Doppler waveforms decrease >50% with left ulnar compression.  Electronically signed by Deitra Mayo MD on 04/20/2019 at 6:49:50 AM.    Final    ECHOCARDIOGRAM LIMITED  Result Date: 04/19/2019    ECHOCARDIOGRAM LIMITED REPORT   Patient Name:   Gabriela Smith Date of Exam: 04/19/2019 Medical Rec #:  [id number]  Height:       63.0 in Accession #:    [id number]     Weight:       183.6 lb Date of Birth:  [birth year]      BSA:          1.86 m Patient Age:    80 years       BP:           142/72 mmHg Patient Gender: F              HR:           75 bpm. Exam Location:  Inpatient Procedure: Limited Echo, Limited Color Doppler and Cardiac Doppler Indications:    Aortic Stenosis 424.1 / 135.0  History:        Patient has prior history of Echocardiogram examinations, most                 recent 04/16/2019. CHF, CAD; Risk Factors:Dyslipidemia,                 Hypertension and Obesity. AAA.  Sonographer:    Vikki Ports Turrentine Referring Phys: [id number] [place]  1. Left ventricular ejection fraction, by estimation, is 60 to 65%. The left ventricle has normal function. The left ventrical has no regional wall motion abnormalities. Left ventricular diastolic function could not be evaluated. Not assessed.  2. Right ventricular systolic function is normal. The right ventricular size is normal.  3. Left atrial size was mildly dilated.  4. Mild mitral valve regurgitation.  5. Although the mean AVG is low and consistent with mild AS, the Stroke volume index is low at 27.72 and the dimensionless index is 0.44 more consistent with moderate AS. This likely represents low flow low gradient moderate aortic stenosis. . The aortic valve has an indeterminant number of cusps. Aortic valve regurgitation is not visualized. Aortic valve area, by VTI measures 1.37 cm. Aortic valve mean gradient measures 11.2 mmHg.  6. The inferior vena cava is normal in size with greater than 50% respiratory variability, suggesting right atrial pressure of 3 mmHg. FINDINGS  Left Ventricle: Left ventricular ejection fraction, by  estimation, is 60 to 65%. The left ventricle has normal function. The left ventricle has no regional wall motion abnormalities. The left ventricular internal cavity size was normal in size. There is  no left ventricular hypertrophy. Not assessed. Right Ventricle: The right ventricular size is normal. No increase in right ventricular wall thickness. Right ventricular systolic function is normal. Left Atrium: Left atrial size was mildly dilated. Right Atrium: Right atrial size was normal in size. Pericardium: There is no evidence of pericardial effusion. Mitral Valve: The mitral valve is normal in structure and function. There is mild thickening of the anterior mitral valve leaflet(s). There is mild calcification of the anterior mitral valve leaflet(s). Normal mobility of the mitral valve leaflets. Moderate mitral annular calcification of the posterior MV annulus. Mild mitral valve regurgitation. No evidence of mitral valve stenosis. Tricuspid Valve: The tricuspid valve is normal in structure. Tricuspid valve regurgitation is not demonstrated. No evidence of tricuspid stenosis. Aortic Valve: Although the mean AVG is low and consistent with mild AS, the Stroke volume index is low at 27.72 and the dimensionless index is 0.44 more consistent with moderate AS. This likely represents low flow low gradient moderate aortic stenosis. The aortic valve has an indeterminant number of cusps. . There is severe thickening and severe calcifcation of the aortic valve. Aortic valve regurgitation is  not visualized. Moderate aortic stenosis is present. Moderate to severe aortic valve annular calcification. There is severe thickening of the aortic valve. There is severe calcifcation of the aortic valve. Aortic valve mean gradient measures 11.2 mmHg. Aortic valve peak gradient measures 20.4 mmHg. Aortic valve area, by VTI measures 1.37 cm. Pulmonic Valve: The pulmonic valve was normal in structure. Pulmonic valve regurgitation is  trivial. No evidence of pulmonic stenosis. Aorta: The aortic root is normal in size and structure. Venous: The inferior vena cava is normal in size with greater than 50% respiratory variability, suggesting right atrial pressure of 3 mmHg. The inferior vena cava and the hepatic vein show a normal flow pattern. IAS/Shunts: No atrial level shunt detected by color flow Doppler.  LEFT VENTRICLE PLAX 2D LVIDd:         4.26 cm LVIDs:         2.70 cm LV PW:         1.08 cm LV IVS:        1.07 cm LVOT diam:     2.00 cm LV SV:         64.72 ml LV SV Index:   27.72 LVOT Area:     3.14 cm  LEFT ATRIUM         Index LA diam:    4.50 cm 2.41 cm/m  AORTIC VALVE AV Area (Vmax):    1.29 cm AV Area (Vmean):   1.15 cm AV Area (VTI):     1.37 cm AV Vmax:           225.80 cm/s AV Vmean:          160.000 cm/s AV VTI:            0.473 m AV Peak Grad:      20.4 mmHg AV Mean Grad:      11.2 mmHg LVOT Vmax:         92.70 cm/s LVOT Vmean:        58.800 cm/s LVOT VTI:          0.206 m LVOT/AV VTI ratio: 0.44  AORTA Ao Root diam: 2.60 cm  SHUNTS Systemic VTI:  0.21 m Systemic Diam: 2.00 cm Fransico Him MD Electronically signed by Fransico Him MD Signature Date/Time: 04/19/2019/5:49:22 PM    Final     Cardiac Studies   Echocardiogram 04/16/2019  1. Left ventricular ejection fraction, by visual estimation, is 60 to  65%. The left ventricle has normal function. There is no left ventricular  hypertrophy.  2. The left ventricle has no regional wall motion abnormalities.  3. Left ventricular diastolic function could not be evaluated.  4. Global right ventricle has normal systolic function.The right  ventricular size is normal. No increase in right ventricular wall  thickness.  5. Left atrial size was mildly dilated.  6. Right atrial size was normal.  7. Mild calcification of the anterior mitral valve leaflet(s). Mild  thickening of the anterior mitral valve leaflet(s). Moderate mitral  annular calcification. Mild to  moderate mitral valve regurgitation. No  evidence of mitral stenosis.  8. The tricuspid valve is normal in structure. Tricuspid valve  regurgitation is mild.  9. The aortic valve was not well visualized. Aortic valve regurgitation  is not visualized. No evidence of aortic valve sclerosis or stenosis.  10. The pulmonic valve was normal in structure. Pulmonic valve  regurgitation is not visualized.  11. Normal pulmonary artery systolic pressure.  12. The inferior vena cava is normal in  size with greater than 50%  respiratory variability, suggesting right atrial pressure of 3 mmHg.  13. Small pericardial effusion.  14. The pericardial effusion is anterior to the right ventricle.  15. There appears to be some degree of aortic stenosis but doppler  interrogation of the AV was not done. Recommend repeat limited study of  the AV to assess for aortic stenosis.    04/19/19   1. Left ventricular ejection fraction, by estimation, is 60 to 65%. The  left ventricle has normal function. The left ventrical has no regional  wall motion abnormalities. Left ventricular diastolic function could not  be evaluated. Not assessed.  2. Right ventricular systolic function is normal. The right ventricular  size is normal.  3. Left atrial size was mildly dilated.  4. Mild mitral valve regurgitation.  5. Although the mean AVG is low and consistent with mild AS, the Stroke  volume index is low at 27.72 and the dimensionless index is 0.44 more  consistent with moderate AS. This likely represents low flow low gradient  moderate aortic stenosis. . The  aortic valve has an indeterminant number of cusps. Aortic valve  regurgitation is not visualized. Aortic valve area, by VTI measures 1.37  cm. Aortic valve mean gradient measures 11.2 mmHg.  6. The inferior vena cava is normal in size with greater than 50%  respiratory variability, suggesting right atrial pressure of 3 mmHg.    Diagnostic cath   04/20/19 Dominance: Right      Patient Profile     79 y.o. female with a hx of CAD with PTCA in 1993 and neg nuc in 2017, AAA, Bladder cancer, HLD, HTN and arthritis now presents with SOB, weight gain.  No chest pain.  Troponin 156->167->340->493.  ECG sinus with ectopy.  BNP 841.  Assessment & Plan    Acute diastolic heart failure She seems to be euvolemic.     She will continue the low dose diuretic  Paroxysmal atrial fibrillation with RVR, new onset Resume Eliquis.  Stop heparin.   Increase metoprolol.     I might need to start amio if she has any sustained episodes with rapid rate.   CAD Three vessel CAD as above.   I had a long discussion with the patient about the risk benefits of surgery.  I discussed the results of studies outlining the risk of OMT vs surgery for left main and left main equivalent.  I used the predictive model from Dr. Abran Duke note.  We will continue with nursing education and ask cardiac rehab to educate.  Ultimately, she think that she wants to have a family discussion at home before making a decision.  I will check back with her later today to understand whether she is still insisting on going home and we will make arrangements.   HLD LDL 185, intolerant to statins and zetia.    She will be referred to the [facility] after discharge.   Aortic stenosis Moderate.   I would suggest conservative therapy without surgical intervention at this time.    Hypertension This will be treated in the context of treating the atrial fib.   I had started Norvasc but will stop this to maximize the beta blocker.    For questions or updates, please contact [place] Please consult [email or web address] for contact info under        Signed, Minus Breeding, MD  04/20/2019, 9:07 AM

## 2019-04-21 LAB — APTT: aPTT: 57 seconds — ABNORMAL HIGH (ref 24–36)

## 2019-04-21 MED ORDER — METOPROLOL SUCCINATE ER 100 MG PO TB24
100.0000 mg | ORAL_TABLET | Freq: Every day | ORAL | Status: DC
  Administered 2019-04-21 – 2019-04-24 (×4): 100 mg via ORAL
  Filled 2019-04-21: qty 1
  Filled 2019-04-21: qty 2
  Filled 2019-04-21 (×2): qty 1

## 2019-04-21 MED ORDER — METOPROLOL TARTRATE 25 MG PO TABS
25.0000 mg | ORAL_TABLET | ORAL | Status: DC | PRN
  Administered 2019-04-21: 25 mg via ORAL
  Filled 2019-04-21: qty 1

## 2019-04-21 MED ORDER — HEPARIN (PORCINE) 25000 UT/250ML-% IV SOLN
1000.0000 [IU]/h | INTRAVENOUS | Status: DC
  Administered 2019-04-21 – 2019-04-24 (×4): 1000 [IU]/h via INTRAVENOUS
  Filled 2019-04-21 (×4): qty 250

## 2019-04-21 NOTE — Progress Notes (Signed)
CARDIAC REHAB PHASE I   PRE:  Rate/Rhythm: 95 SR with PACs  BP:  Sitting: 142/67      SaO2: 96 RA  MODE:  Ambulation: 370 ft   POST:  Rate/Rhythm: 103 ST with PACS  BP:  Sitting: 154/97    SaO2: 98 RA  Pt ambulated 346ft in hallway independently with steady gait. Pt denies "CP", but does state she feels like she can't get a deep breath at end of walk. Encouraged pt to listen to this symptom and rest when needed. Reviewed preop education with pt. Pt states she cares for her husband who has Parkinsons, stressed importance of sternal precautions and weight restrictions after surgery. Pt states she lives in close proximity to daughter, and her family is getting a nurse to help after discharge. Encouraged ambulation as able. Will continue to follow throughout hospital stay.  [id number] Rufina Falco, RN BSN 04/21/2019 11:12 AM

## 2019-04-21 NOTE — Progress Notes (Signed)
Patient transferred from ICU at 1315hrs.  Oriented to unit and plan of care for shift.

## 2019-04-21 NOTE — Progress Notes (Signed)
ANTICOAGULATION CONSULT NOTE  Pharmacy Consult for heparin IV Indication: chest pain/ACS/Afib  Patient Measurements: Height: 5\' 3"  (160 cm) Weight: 168 lb 10.4 oz (76.5 kg) IBW/kg (Calculated) : 52.4 Heparin Dosing Weight: 69 kg  Vital Signs: Temp: 97.9 F (36.6 C) (02/11 1315) Temp Source: Oral (02/11 1315) BP: 129/73 (02/11 1400) Pulse Rate: 85 (02/11 1400)  Labs: Recent Labs    04/19/19 0410 04/20/19 0645 04/21/19 1543  HGB 12.0 13.0  --   HCT 37.5 39.9  --   PLT 272 302  --   APTT  --  124* 57*  HEPARINUNFRC  --  0.98*  --   CREATININE 0.83 0.80  --     Estimated Creatinine Clearance: 54.9 mL/min (by C-G formula based on SCr of 0.8 mg/dL).  Assessment: 79 y/o F presenting with shortness of breath. PMH is extensive but significant for h/o bladder cancer, CAD, and abdominal aortic aneurysm from (2017). Pharmacy has been consulted to dose heparin IV infusion for suspected ACS/STEMI. She is also noted with new onset afib.  Cath showed multivessel CAD, LM disease, and AS.  Surgery was discussed - she declined and apixaban started for Afib and planned DC home.  Overnight she changed her mind and OR scheduled for Monday.  Will stop apixaban and restart heparin.  Will use aptt for heparin dosing given apixaban falsely elevated HL.   APTT sub-therapeutic after heparin resumption.  No issue with heparin infusion no bleeding per RN.  Goal of Therapy:  APTT 66-102s Heparin level 0.3-0.7 units/ml Monitor platelets by anticoagulation protocol: Yes    Plan:  Increase heparin infusion to 1100 units/hr Check 8 hr aPTT  Kinzlee Selvy D. Mina Marble, PharmD, BCPS, [place] 04/21/2019, 4:41 PM

## 2019-04-21 NOTE — Progress Notes (Signed)
Progress Note  Patient Name: Gabriela Smith Date of Encounter: 04/21/2019  Primary Cardiologist: Minus Breeding, MD   Subjective   No chest pain.  No SOB.   Inpatient Medications    Scheduled Meds: . apixaban  5 mg Oral BID  . aspirin EC  81 mg Oral Daily  . B-complex with vitamin C  1 tablet Oral Daily  . bumetanide  1 mg Oral Daily  . Chlorhexidine Gluconate Cloth  6 each Topical Daily  . cholecalciferol  1,000 Units Oral Daily  . gabapentin  100 mg Oral QHS  . metoprolol succinate  150 mg Oral Daily  . mupirocin ointment  1 application Nasal BID  . potassium chloride  40 mEq Oral Daily  . sodium chloride flush  3 mL Intravenous Q12H  . sodium chloride flush  3 mL Intravenous Q12H  . vitamin B-12  500 mcg Oral Daily   Continuous Infusions: . sodium chloride    . sodium chloride     PRN Meds: sodium chloride, acetaminophen, lip balm, metoprolol tartrate, nitroGLYCERIN, ondansetron (ZOFRAN) IV, sodium chloride flush   Vital Signs    Vitals:   04/21/19 0400 04/21/19 0500 04/21/19 0600 04/21/19 0700  BP: (!) 131/94 (!) 98/59 91/64 108/71  Pulse: (!) 157 (!) 38 82 80  Resp:      Temp:      TempSrc:      SpO2: 96% 94% 94% 97%  Weight:  76.5 kg    Height:        Intake/Output Summary (Last 24 hours) at 04/21/2019 0754 Last data filed at 04/21/2019 0746 Gross per 24 hour  Intake 1247.5 ml  Output 700 ml  Net 547.5 ml   Last 3 Weights 04/21/2019 04/20/2019 04/19/2019  Weight (lbs) 168 lb 10.4 oz 182 lb 1.6 oz 183 lb 9.6 oz  Weight (kg) 76.5 kg 82.6 kg 83.28 kg      Telemetry    NSR - Personally Reviewed  ECG    NA  Physical Exam   GEN: No  acute distress.   Neck: No  JVD Cardiac: RRR, soft systolic murmurs, rubs, or gallops.  Respiratory: Clear   to auscultation bilaterally. GI: Soft, nontender, non-distended, normal bowel sounds  MS:  No edema; No deformity. Neuro:   Nonfocal  Psych: Oriented and appropriate    Labs    High Sensitivity  Troponin:   Recent Labs  Lab 04/15/19 1214 04/15/19 1423 04/15/19 2313 04/16/19 0057 04/16/19 0834  TROPONINIHS 156* 167* 340* 493* 440*      Chemistry Recent Labs  Lab 04/15/19 1822 04/16/19 0508 04/18/19 0314 04/19/19 0410 04/20/19 0645  NA  --    < > 139 138 138  K  --    < > 3.9 3.8 3.8  CL  --    < > 104 101 104  CO2  --    < > 24 24 21*  GLUCOSE  --    < > 103* 103* 118*  BUN  --    < > 17 16 16   CREATININE  --    < > 0.85 0.83 0.80  CALCIUM  --    < > 9.1 9.3 9.5  PROT 7.3  --   --   --   --   ALBUMIN 4.0  --   --   --   --   AST 30  --   --   --   --   ALT 45*  --   --   --   --  ALKPHOS 65  --   --   --   --   BILITOT 1.0  --   --   --   --   GFRNONAA  --    < > >60 >60 >60  GFRAA  --    < > >60 >60 >60  ANIONGAP  --    < > 11 13 13    < > = values in this interval not displayed.     Hematology Recent Labs  Lab 04/18/19 0314 04/19/19 0410 04/20/19 0645  WBC 5.8 5.5 6.9  RBC 3.68* 3.83* 4.12  HGB 11.7* 12.0 13.0  HCT 36.4 37.5 39.9  MCV 98.9 97.9 96.8  MCH 31.8 31.3 31.6  MCHC 32.1 32.0 32.6  RDW 13.5 13.5 13.8  PLT 276 272 302    BNP Recent Labs  Lab 04/15/19 1822  BNP 841.1*     DDimer  Recent Labs  Lab 04/15/19 1822  DDIMER 1.24*     Radiology    CARDIAC CATHETERIZATION  Result Date: 04/19/2019  LEFT CORONARY ARTERY  Ost LM lesion is 40% stenosed. Mid LM to Ost LAD lesion is 85% stenosed with 95% stenosed side branch in Ost Cx.  Prox LAD to Mid LAD lesion is 90% stenosed -proximal to 1st Diag; Mid LAD lesion is 80% stenosed after 1st Diag  1st Diag lesion is 10% stenosed with 55% stenosed side branch in Lat 1st Diag.  Prox Cx to Mid Cx lesion is 35% stenosed.  RIGHT CORONARY ARTERY  Ost RCA to Prox RCA lesion is 60% stenosed. Prox RCA lesion is 70% stenosed.  Mid RCA to Dist RCA lesion is 90% stenosed.  Dist RCA lesion is 20% stenosed with 50% stenosed side branch in RPAV. RPAV lesion is 65% stenosed. RPDA lesion is 50%  stenosed.  -------------------  LV end diastolic pressure is normal.  SUMMARY  Severe multivessel disease with mild ostial and severe distal LEFT MAIN disease involving ostial LCx with severe proximal and mid LAD disease as well as ostial and mid RCA diffuse disease.  Normal LVEDP with normal EF by Echocardiogram RECOMMENDATIONS  Patient be transferred to [place] and we will consult CVTS for LEFT MAIN disease  She is hemodynamically stable, therefore would not place IABP, however she has recurrent chest pain, will need urgent IVP and possible urgent CABG  We will start IV heparin 8 hours after sheath removal.  Eliquis will not be restarted.  Continue other cardiac risk factor management with blood pressure, and lipid control.  She does appear to be adequately diuresed based on LVEDP Glenetta Hew, M.D., M.S. Interventional Cardiologist   VAS US DOPPLER PRE CABG  Result Date: 04/20/2019 PREOPERATIVE VASCULAR EVALUATION  Indications: Pre-CABG. Performing Technologist: bynum, [name] RVT Carlos Levering Rvt  Examination Guidelines: A complete evaluation includes B-mode imaging, spectral Doppler, color Doppler, and power Doppler as needed of all accessible portions of each vessel. Bilateral testing is considered an integral part of a complete examination. Limited examinations for reoccurring indications may be performed as noted.  Right Carotid Findings: +----------+--------+--------+--------+--------------------+-------------------+           PSV cm/sEDV cm/sStenosisDescribe            Comments            +----------+--------+--------+--------+--------------------+-------------------+ CCA Prox  93      20                                                      +----------+--------+--------+--------+--------------------+-------------------+  CCA Distal100     22              heterogenous        intimal thickening   +----------+--------+--------+--------+--------------------+-------------------+ ICA Prox  111     30              hyperechoic and                                                           calcific                                +----------+--------+--------+--------+--------------------+-------------------+ ICA Mid   120     40                                  upper limits of                                                           normal              +----------+--------+--------+--------+--------------------+-------------------+ ICA Distal137     38                                                      +----------+--------+--------+--------+--------------------+-------------------+ ECA       228     14              heterogenous                            +----------+--------+--------+--------+--------------------+-------------------+ Portions of this table do not appear on this page. +----------+--------+-------+--------+------------+           PSV cm/sEDV cmsDescribeArm Pressure +----------+--------+-------+--------+------------+ Subclavian118                                 +----------+--------+-------+--------+------------+ +---------+--------+--+--------+--+---------+ VertebralPSV cm/s37EDV cm/s10Antegrade +---------+--------+--+--------+--+---------+ Left Carotid Findings: +----------+--------+--------+--------+---------------------+------------------+           PSV cm/sEDV cm/sStenosisDescribe             Comments           +----------+--------+--------+--------+---------------------+------------------+ CCA Prox  87      20                                                      +----------+--------+--------+--------+---------------------+------------------+ CCA Distal117     23                                                      +----------+--------+--------+--------+---------------------+------------------+  ICA Prox  111      23              calcific, diffuse andintimal thickening                                   heterogenous                            +----------+--------+--------+--------+---------------------+------------------+ ICA Distal68      28                                                      +----------+--------+--------+--------+---------------------+------------------+ ECA       187     17              heterogenous                            +----------+--------+--------+--------+---------------------+------------------+ +----------+--------+--------+--------+------------+ SubclavianPSV cm/sEDV cm/sDescribeArm Pressure +----------+--------+--------+--------+------------+           140                     170          +----------+--------+--------+--------+------------+ +---------+--------+--+--------+--+---------+ VertebralPSV cm/s44EDV cm/s10Antegrade +---------+--------+--+--------+--+---------+  ABI Findings: +--------+------------------+-----+--------+--------+ Right   Rt Pressure (mmHg)IndexWaveformComment  +--------+------------------+-----+--------+--------+ Brachial                                        +--------+------------------+-----+--------+--------+ PTA     128               0.75                  +--------+------------------+-----+--------+--------+ DP      120               0.71                  +--------+------------------+-----+--------+--------+ +--------+------------------+-----+---------+-------+ Left    Lt Pressure (mmHg)IndexWaveform Comment +--------+------------------+-----+---------+-------+ Brachial170                    triphasic        +--------+------------------+-----+---------+-------+ PTA     166               0.98                  +--------+------------------+-----+---------+-------+ DP      158               0.93                  +--------+------------------+-----+---------+-------+  +-------+---------------+----------------+ ABI/TBIToday's ABI/TBIPrevious ABI/TBI +-------+---------------+----------------+ Right  0.75                            +-------+---------------+----------------+ Left   0.98                            +-------+---------------+----------------+  Right Doppler Findings: +--------+--------+-----+-------+--------+ Site    PressureIndexDopplerComments +--------+--------+-----+-------+--------+ Brachial                             +--------+--------+-----+-------+--------+  Radial                               +--------+--------+-----+-------+--------+ Ulnar                                +--------+--------+-----+-------+--------+  Left Doppler Findings: +--------+--------+-----+---------+--------+ Site    PressureIndexDoppler  Comments +--------+--------+-----+---------+--------+ Brachial170          triphasic         +--------+--------+-----+---------+--------+ Radial               triphasic         +--------+--------+-----+---------+--------+ Ulnar                triphasic         +--------+--------+-----+---------+--------+ Technologist Notes: TR band, unable to assess at time of exam.  Summary: Right Carotid: Velocities in the right ICA are consistent with a 1-39% stenosis. Left Carotid: Velocities in the left ICA are consistent with a 1-39% stenosis. Vertebrals: Bilateral vertebral arteries demonstrate antegrade flow. Right ABI: Resting right ankle-brachial index indicates moderate right lower extremity arterial disease. Left ABI: Resting left ankle-brachial index is within normal range. No evidence of significant left lower extremity arterial disease. Left Upper Extremity: Doppler waveform obliterate with left radial compression. Doppler waveforms decrease >50% with left ulnar compression.  Electronically signed by Deitra Mayo MD on 04/20/2019 at 6:49:50 AM.    Final    ECHOCARDIOGRAM LIMITED  Result  Date: 04/19/2019    ECHOCARDIOGRAM LIMITED REPORT   Patient Name:   Gabriela Smith Date of Exam: 04/19/2019 Medical Rec #:  [id number]      Height:       63.0 in Accession #:    [id number]     Weight:       183.6 lb Date of Birth:  09/25/1940      BSA:          1.86 m Patient Age:    79 years       BP:           142/72 mmHg Patient Gender: F              HR:           75 bpm. Exam Location:  Inpatient Procedure: Limited Echo, Limited Color Doppler and Cardiac Doppler Indications:    Aortic Stenosis 424.1 / 135.0  History:        Patient has prior history of Echocardiogram examinations, most                 recent 04/16/2019. CHF, CAD; Risk Factors:Dyslipidemia,                 Hypertension and Obesity. AAA.  Sonographer:    Vikki Ports Turrentine Referring Phys: [id number] [place]  1. Left ventricular ejection fraction, by estimation, is 60 to 65%. The left ventricle has normal function. The left ventrical has no regional wall motion abnormalities. Left ventricular diastolic function could not be evaluated. Not assessed.  2. Right ventricular systolic function is normal. The right ventricular size is normal.  3. Left atrial size was mildly dilated.  4. Mild mitral valve regurgitation.  5. Although the mean AVG is low and consistent with mild AS, the Stroke volume index is low at 27.72 and the dimensionless index is 0.44 more consistent with moderate AS. This likely represents  low flow low gradient moderate aortic stenosis. . The aortic valve has an indeterminant number of cusps. Aortic valve regurgitation is not visualized. Aortic valve area, by VTI measures 1.37 cm. Aortic valve mean gradient measures 11.2 mmHg.  6. The inferior vena cava is normal in size with greater than 50% respiratory variability, suggesting right atrial pressure of 3 mmHg. FINDINGS  Left Ventricle: Left ventricular ejection fraction, by estimation, is 60 to 65%. The left ventricle has normal function. The left ventricle has no  regional wall motion abnormalities. The left ventricular internal cavity size was normal in size. There is  no left ventricular hypertrophy. Not assessed. Right Ventricle: The right ventricular size is normal. No increase in right ventricular wall thickness. Right ventricular systolic function is normal. Left Atrium: Left atrial size was mildly dilated. Right Atrium: Right atrial size was normal in size. Pericardium: There is no evidence of pericardial effusion. Mitral Valve: The mitral valve is normal in structure and function. There is mild thickening of the anterior mitral valve leaflet(s). There is mild calcification of the anterior mitral valve leaflet(s). Normal mobility of the mitral valve leaflets. Moderate mitral annular calcification of the posterior MV annulus. Mild mitral valve regurgitation. No evidence of mitral valve stenosis. Tricuspid Valve: The tricuspid valve is normal in structure. Tricuspid valve regurgitation is not demonstrated. No evidence of tricuspid stenosis. Aortic Valve: Although the mean AVG is low and consistent with mild AS, the Stroke volume index is low at 27.72 and the dimensionless index is 0.44 more consistent with moderate AS. This likely represents low flow low gradient moderate aortic stenosis. The aortic valve has an indeterminant number of cusps. . There is severe thickening and severe calcifcation of the aortic valve. Aortic valve regurgitation is not visualized. Moderate aortic stenosis is present. Moderate to severe aortic valve annular calcification. There is severe thickening of the aortic valve. There is severe calcifcation of the aortic valve. Aortic valve mean gradient measures 11.2 mmHg. Aortic valve peak gradient measures 20.4 mmHg. Aortic valve area, by VTI measures 1.37 cm. Pulmonic Valve: The pulmonic valve was normal in structure. Pulmonic valve regurgitation is trivial. No evidence of pulmonic stenosis. Aorta: The aortic root is normal in size and structure.  Venous: The inferior vena cava is normal in size with greater than 50% respiratory variability, suggesting right atrial pressure of 3 mmHg. The inferior vena cava and the hepatic vein show a normal flow pattern. IAS/Shunts: No atrial level shunt detected by color flow Doppler.  LEFT VENTRICLE PLAX 2D LVIDd:         4.26 cm LVIDs:         2.70 cm LV PW:         1.08 cm LV IVS:        1.07 cm LVOT diam:     2.00 cm LV SV:         64.72 ml LV SV Index:   27.72 LVOT Area:     3.14 cm  LEFT ATRIUM         Index LA diam:    4.50 cm 2.41 cm/m  AORTIC VALVE AV Area (Vmax):    1.29 cm AV Area (Vmean):   1.15 cm AV Area (VTI):     1.37 cm AV Vmax:           225.80 cm/s AV Vmean:          160.000 cm/s AV VTI:            0.473  m AV Peak Grad:      20.4 mmHg AV Mean Grad:      11.2 mmHg LVOT Vmax:         92.70 cm/s LVOT Vmean:        58.800 cm/s LVOT VTI:          0.206 m LVOT/AV VTI ratio: 0.44  AORTA Ao Root diam: 2.60 cm  SHUNTS Systemic VTI:  0.21 m Systemic Diam: 2.00 cm Fransico Him MD Electronically signed by Fransico Him MD Signature Date/Time: 04/19/2019/5:49:22 PM    Final     Cardiac Studies   Echocardiogram 04/16/2019  1. Left ventricular ejection fraction, by visual estimation, is 60 to  65%. The left ventricle has normal function. There is no left ventricular  hypertrophy.  2. The left ventricle has no regional wall motion abnormalities.  3. Left ventricular diastolic function could not be evaluated.  4. Global right ventricle has normal systolic function.The right  ventricular size is normal. No increase in right ventricular wall  thickness.  5. Left atrial size was mildly dilated.  6. Right atrial size was normal.  7. Mild calcification of the anterior mitral valve leaflet(s). Mild  thickening of the anterior mitral valve leaflet(s). Moderate mitral  annular calcification. Mild to moderate mitral valve regurgitation. No  evidence of mitral stenosis.  8. The tricuspid valve is normal  in structure. Tricuspid valve  regurgitation is mild.  9. The aortic valve was not well visualized. Aortic valve regurgitation  is not visualized. No evidence of aortic valve sclerosis or stenosis.  10. The pulmonic valve was normal in structure. Pulmonic valve  regurgitation is not visualized.  11. Normal pulmonary artery systolic pressure.  12. The inferior vena cava is normal in size with greater than 50%  respiratory variability, suggesting right atrial pressure of 3 mmHg.  13. Small pericardial effusion.  14. The pericardial effusion is anterior to the right ventricle.  15. There appears to be some degree of aortic stenosis but doppler  interrogation of the AV was not done. Recommend repeat limited study of  the AV to assess for aortic stenosis.    04/19/19   1. Left ventricular ejection fraction, by estimation, is 60 to 65%. The  left ventricle has normal function. The left ventrical has no regional  wall motion abnormalities. Left ventricular diastolic function could not  be evaluated. Not assessed.  2. Right ventricular systolic function is normal. The right ventricular  size is normal.  3. Left atrial size was mildly dilated.  4. Mild mitral valve regurgitation.  5. Although the mean AVG is low and consistent with mild AS, the Stroke  volume index is low at 27.72 and the dimensionless index is 0.44 more  consistent with moderate AS. This likely represents low flow low gradient  moderate aortic stenosis. . The  aortic valve has an indeterminant number of cusps. Aortic valve  regurgitation is not visualized. Aortic valve area, by VTI measures 1.37  cm. Aortic valve mean gradient measures 11.2 mmHg.  6. The inferior vena cava is normal in size with greater than 50%  respiratory variability, suggesting right atrial pressure of 3 mmHg.    Diagnostic cath  04/20/19 Dominance: Right      Patient Profile     79 y.o. female with a hx of CAD with PTCA in 1993 and neg  nuc in 2017, AAA, Bladder cancer, HLD, HTN and arthritis now presents with SOB, weight gain.  No chest pain.  Troponin 156->167->340->493.  ECG sinus with ectopy.  BNP 841.  Assessment & Plan    Acute diastolic heart failure Seems to be euvolemic.  I will continue current low dose diuretic.   Paroxysmal atrial fibrillation with RVR, new onset Since she is going to surgery I will change back to Heparin and stop Eliquis.  See below for beta blocker  CAD The patient has agreed to stay for CABG.   Restart heparin.  OK to transfer to floor.   HLD She is intolerant to statins and will be referred for PCSK9 and consultation with [facility] after discharge.    Aortic stenosis Moderate.  Will follow.    Hypertension BP now running low.    Increased beta blocker.  Will reduce secondary to low BPs.     For questions or updates, please contact [place] Please consult [email or web address] for contact info under        Signed, Minus Breeding, MD  04/21/2019, 7:54 AM

## 2019-04-21 NOTE — Progress Notes (Signed)
Gabriela Smith for heparin IV Indication: chest pain/ACS / Afib  Patient Measurements: Height: 5\' 3"  (160 cm) Weight: 168 lb 10.4 oz (76.5 kg) IBW/kg (Calculated) : 52.4 Heparin Dosing Weight: 69 kg  Vital Signs: Temp: 97.9 F (36.6 C) (02/11 0759) Temp Source: Oral (02/11 0759) BP: 112/58 (02/11 0900) Pulse Rate: 73 (02/11 0900)  Labs: Recent Labs    04/19/19 0410 04/20/19 0645  HGB 12.0 13.0  HCT 37.5 39.9  PLT 272 302  APTT  --  124*  HEPARINUNFRC  --  0.98*  CREATININE 0.83 0.80    Estimated Creatinine Clearance: 54.9 mL/min (by C-G formula based on SCr of 0.8 mg/dL).  Assessment: 79 y/o F presenting with shortness of breath. PMH is extensive but significant for h/o bladder cancer, CAD, and abdominal aortic aneurysm from (2017). Pharmacy has been consulted to dose heparin IV infusion for suspected ACS/STEMI. She is also noted with new onset afib.  Cath showed multivessel CAD, LM disease, and AS.  Surgery was discussed - she declined and apixaban started for Afib and planned Dc home.  Overnight she changed her mind and OR scheduled for Monday.  Will stop apixaban and restart heparin.  Will use aptt for heparin dosing given apixaban falsely elevated HL.  No bleeding, CBC stable    Goal of Therapy:  aptt goal 66-102s Heparin level 0.3-0.7 units/ml Monitor platelets by anticoagulation protocol: Yes    Plan:  Heparin to start at 1000 units/hr tonight 6 hour aptt and daily with CBC  Bonnita Nasuti Pharm.D. CPP, BCPS Clinical Pharmacist [phone] 04/21/2019 9:17 AM

## 2019-04-22 LAB — CBC
HCT: 37.7 % (ref 36.0–46.0)
Hemoglobin: 12.4 g/dL (ref 12.0–15.0)
MCH: 32 pg (ref 26.0–34.0)
MCHC: 32.9 g/dL (ref 30.0–36.0)
MCV: 97.2 fL (ref 80.0–100.0)
Platelets: 249 10*3/uL (ref 150–400)
RBC: 3.88 MIL/uL (ref 3.87–5.11)
RDW: 13.4 % (ref 11.5–15.5)
WBC: 6.8 10*3/uL (ref 4.0–10.5)
nRBC: 0 % (ref 0.0–0.2)

## 2019-04-22 LAB — APTT
aPTT: 129 seconds — ABNORMAL HIGH (ref 24–36)
aPTT: 93 seconds — ABNORMAL HIGH (ref 24–36)
aPTT: 103 seconds — ABNORMAL HIGH (ref 24–36)
aPTT: 79 seconds — ABNORMAL HIGH (ref 24–36)

## 2019-04-22 LAB — HEPARIN LEVEL (UNFRACTIONATED): Heparin Unfractionated: 0.69 IU/mL (ref 0.30–0.70)

## 2019-04-22 MED ORDER — METOPROLOL TARTRATE 5 MG/5ML IV SOLN
INTRAVENOUS | Status: AC
  Administered 2019-04-22: 5 mg
  Filled 2019-04-22: qty 5

## 2019-04-22 MED ORDER — DILTIAZEM LOAD VIA INFUSION
10.0000 mg | Freq: Once | INTRAVENOUS | Status: AC
  Administered 2019-04-22: 10 mg via INTRAVENOUS
  Filled 2019-04-22: qty 10

## 2019-04-22 MED ORDER — DILTIAZEM HCL-DEXTROSE 125-5 MG/125ML-% IV SOLN (PREMIX)
5.0000 mg/h | INTRAVENOUS | Status: DC
  Administered 2019-04-22: 5 mg/h via INTRAVENOUS
  Filled 2019-04-22: qty 125

## 2019-04-22 MED ORDER — METOPROLOL TARTRATE 5 MG/5ML IV SOLN: 5.0000 mg | Freq: Once | INTRAVENOUS | Status: AC

## 2019-04-22 NOTE — Progress Notes (Signed)
    Paged by RN reporting patient currently in atrial fibrillation with RVR, rates in the 130 range.  Orders given for 5 mg IV metoprolol until bedside assessment. On my arrival, patient's rates continue to be in the 130 range.  BP stable.  Reporting some mild chest heaviness however overall appears very stable. Given stable LV function, will proceed with diltiazem infusion in hopes for better rate control.  EKG pending.     Kathyrn Drown NP-C [place] Pager: [phone]

## 2019-04-22 NOTE — Care Management Important Message (Signed)
Important Message  Patient Details  Name: Gabriela Smith MRN: [id number] Date of Birth: 11/13/1940   Medicare Important Message Given:  Yes     Shelda Altes 04/22/2019, 12:26 PM

## 2019-04-22 NOTE — Progress Notes (Signed)
ANTICOAGULATION CONSULT NOTE  Pharmacy Consult for Heparin  Indication: chest pain/ACS/Afib  Patient Measurements: Height: 5\' 3"  (160 cm) Weight: 168 lb 10.4 oz (76.5 kg) IBW/kg (Calculated) : 52.4 Heparin Dosing Weight: 69 kg  Vital Signs: Temp: 97.6 F (36.4 C) (02/11 2036) Temp Source: Oral (02/11 2036) BP: 144/79 (02/11 2036) Pulse Rate: 67 (02/11 2036)  Labs: Recent Labs    04/19/19 0410 04/20/19 0645 04/21/19 1543 04/22/19 0126  HGB 12.0 13.0  --   --   HCT 37.5 39.9  --   --   PLT 272 302  --   --   APTT  --  124* 57* 129*  HEPARINUNFRC  --  0.98*  --   --   CREATININE 0.83 0.80  --   --     Estimated Creatinine Clearance: 54.9 mL/min (by C-G formula based on SCr of 0.8 mg/dL).  Assessment: 79 y/o F presenting with shortness of breath. PMH is extensive but significant for h/o bladder cancer, CAD, and abdominal aortic aneurysm from (2017). Pharmacy has been consulted to dose heparin IV infusion for suspected ACS/STEMI. She is also noted with new onset afib.  Cath showed multivessel CAD, LM disease, and AS.  Surgery was discussed - she declined and apixaban started for Afib and planned DC home.  Overnight she changed her mind and OR scheduled for Monday.  Will stop apixaban and restart heparin.  Will use aptt for heparin dosing given apixaban falsely elevated HL.   APTT above goal this AM No issues per RN  Goal of Therapy:  APTT 66-102s Heparin level 0.3-0.7 units/ml Monitor platelets by anticoagulation protocol: Yes    Plan:  Dec heparin back to 1000 units/hr Re-check heparin level and aPTT at [place], PharmD, [place] Pharmacist Phone: [phone]

## 2019-04-22 NOTE — Progress Notes (Signed)
Progress Note  Patient Name: Gabriela Smith Date of Encounter: 04/22/2019  Primary Cardiologist: Minus Breeding, MD   Subjective   No pain.  No SOB.  Inpatient Medications    Scheduled Meds: . aspirin EC  81 mg Oral Daily  . B-complex with vitamin C  1 tablet Oral Daily  . bumetanide  1 mg Oral Daily  . cholecalciferol  1,000 Units Oral Daily  . gabapentin  100 mg Oral QHS  . metoprolol succinate  100 mg Oral Daily  . mupirocin ointment  1 application Nasal BID  . potassium chloride  40 mEq Oral Daily  . sodium chloride flush  3 mL Intravenous Q12H  . sodium chloride flush  3 mL Intravenous Q12H  . vitamin B-12  500 mcg Oral Daily   Continuous Infusions: . sodium chloride    . sodium chloride    . heparin 1,000 Units/hr (04/22/19 0557)   PRN Meds: sodium chloride, acetaminophen, lip balm, nitroGLYCERIN, ondansetron (ZOFRAN) IV, sodium chloride flush   Vital Signs    Vitals:   04/21/19 1315 04/21/19 1400 04/21/19 2036 04/22/19 0557  BP: 117/64 129/73 (!) 144/79 140/70  Pulse: 91 85 67 69  Resp:   18 16  Temp: 97.9 F (36.6 C)  97.6 F (36.4 C) 97.7 F (36.5 C)  TempSrc: Oral  Oral Oral  SpO2: 97% 97% 96% 96%  Weight:    82.4 kg  Height:        Intake/Output Summary (Last 24 hours) at 04/22/2019 0827 Last data filed at 04/22/2019 0300 Gross per 24 hour  Intake 411.27 ml  Output --  Net 411.27 ml   Last 3 Weights 04/22/2019 04/21/2019 04/20/2019  Weight (lbs) 181 lb 9.6 oz 168 lb 10.4 oz 182 lb 1.6 oz  Weight (kg) 82.373 kg 76.5 kg 82.6 kg      Telemetry    NSR - Personally Reviewed  ECG    NA  Physical Exam   GEN: No  acute distress.   Neck: No  JVD Cardiac: RRR, no murmurs, rubs, or gallops.  Respiratory: Clear   to auscultation bilaterally. GI: Soft, nontender, non-distended, normal bowel sounds  MS:  No edema; No deformity. Neuro:   Nonfocal  Psych: Oriented and appropriate    Labs    High Sensitivity Troponin:   Recent Labs  Lab  04/15/19 1214 04/15/19 1423 04/15/19 2313 04/16/19 0057 04/16/19 0834  TROPONINIHS 156* 167* 340* 493* 440*      Chemistry Recent Labs  Lab 04/15/19 1822 04/16/19 0508 04/18/19 0314 04/19/19 0410 04/20/19 0645  NA  --    < > 139 138 138  K  --    < > 3.9 3.8 3.8  CL  --    < > 104 101 104  CO2  --    < > 24 24 21*  GLUCOSE  --    < > 103* 103* 118*  BUN  --    < > 17 16 16   CREATININE  --    < > 0.85 0.83 0.80  CALCIUM  --    < > 9.1 9.3 9.5  PROT 7.3  --   --   --   --   ALBUMIN 4.0  --   --   --   --   AST 30  --   --   --   --   ALT 45*  --   --   --   --   [facility]  65  --   --   --   --   BILITOT 1.0  --   --   --   --   GFRNONAA  --    < > >60 >60 >60  GFRAA  --    < > >60 >60 >60  ANIONGAP  --    < > 11 13 13    < > = values in this interval not displayed.     Hematology Recent Labs  Lab 04/19/19 0410 04/20/19 0645 04/22/19 0630  WBC 5.5 6.9 6.8  RBC 3.83* 4.12 3.88  HGB 12.0 13.0 12.4  HCT 37.5 39.9 37.7  MCV 97.9 96.8 97.2  MCH 31.3 31.6 32.0  MCHC 32.0 32.6 32.9  RDW 13.5 13.8 13.4  PLT 272 302 249    BNP Recent Labs  Lab 04/15/19 1822  BNP 841.1*     DDimer  Recent Labs  Lab 04/15/19 1822  DDIMER 1.24*     Radiology    No results found.  Cardiac Studies   Echocardiogram 04/16/2019  1. Left ventricular ejection fraction, by visual estimation, is 60 to  65%. The left ventricle has normal function. There is no left ventricular  hypertrophy.  2. The left ventricle has no regional wall motion abnormalities.  3. Left ventricular diastolic function could not be evaluated.  4. Global right ventricle has normal systolic function.The right  ventricular size is normal. No increase in right ventricular wall  thickness.  5. Left atrial size was mildly dilated.  6. Right atrial size was normal.  7. Mild calcification of the anterior mitral valve leaflet(s). Mild  thickening of the anterior mitral valve leaflet(s). Moderate mitral   annular calcification. Mild to moderate mitral valve regurgitation. No  evidence of mitral stenosis.  8. The tricuspid valve is normal in structure. Tricuspid valve  regurgitation is mild.  9. The aortic valve was not well visualized. Aortic valve regurgitation  is not visualized. No evidence of aortic valve sclerosis or stenosis.  10. The pulmonic valve was normal in structure. Pulmonic valve  regurgitation is not visualized.  11. Normal pulmonary artery systolic pressure.  12. The inferior vena cava is normal in size with greater than 50%  respiratory variability, suggesting right atrial pressure of 3 mmHg.  13. Small pericardial effusion.  14. The pericardial effusion is anterior to the right ventricle.  15. There appears to be some degree of aortic stenosis but doppler  interrogation of the AV was not done. Recommend repeat limited study of  the AV to assess for aortic stenosis.    04/19/19   1. Left ventricular ejection fraction, by estimation, is 60 to 65%. The  left ventricle has normal function. The left ventrical has no regional  wall motion abnormalities. Left ventricular diastolic function could not  be evaluated. Not assessed.  2. Right ventricular systolic function is normal. The right ventricular  size is normal.  3. Left atrial size was mildly dilated.  4. Mild mitral valve regurgitation.  5. Although the mean AVG is low and consistent with mild AS, the Stroke  volume index is low at 27.72 and the dimensionless index is 0.44 more  consistent with moderate AS. This likely represents low flow low gradient  moderate aortic stenosis. . The  aortic valve has an indeterminant number of cusps. Aortic valve  regurgitation is not visualized. Aortic valve area, by VTI measures 1.37  cm. Aortic valve mean gradient measures 11.2 mmHg.  6. The inferior vena cava is  normal in size with greater than 50%  respiratory variability, suggesting right atrial pressure of 3 mmHg.     Diagnostic cath  04/20/19 Dominance: Right      Patient Profile     79 y.o. female with a hx of CAD with PTCA in 1993 and neg nuc in 2017, AAA, Bladder cancer, HLD, HTN and arthritis now presents with SOB, weight gain.  No chest pain.  Troponin 156->167->340->493.  ECG sinus with ectopy.  BNP 841.  Assessment & Plan    Acute diastolic heart failure Euvolemic.  Continue current therapy.   Paroxysmal atrial fibrillation with RVR, new onset On heparin.  Will need DOAC at discharge. a blocker  CAD 3 vessel disease as above.  CABG on Monday.   HLD She is intolerant to statins and will be referred for PCSK9 and consultation with [facility] after discharge.    Aortic stenosis Moderate.  Follow clinically.       Hypertension BP was running low and I backed off on the beta blocker.  Improved.     For questions or updates, please contact [place] Please consult [email or web address] for contact info under        Signed, Minus Breeding, MD  04/22/2019, 8:27 AM

## 2019-04-22 NOTE — Progress Notes (Signed)
     [address]       [place] [address]             [phone]       No events, no pain  Vitals:   04/22/19 0557 04/22/19 1058  BP: 140/70 130/64  Pulse: 69 72  Resp: 16   Temp: 97.7 F (36.5 C)   SpO2: 96%     Alert NAD Sinus EWOB  Labs and studies reviewed  79yo female, NSTEMI, afib, moderate AS Will plan for CABG [place] on Monday.  Will follow aortic valve clinically, but explained that she may need a TAVR in the future. Continue heparin gtt.  No eliquis  Lomas O Mehkai Gallo

## 2019-04-22 NOTE — Plan of Care (Signed)
  Problem: Education: Goal: Knowledge of General Education information will improve Description: Including pain rating scale, medication(s)/side effects and non-pharmacologic comfort measures Outcome: Progressing   Problem: Health Behavior/Discharge Planning: Goal: Ability to manage health-related needs will improve Outcome: Progressing   Problem: Clinical Measurements: Goal: Ability to maintain clinical measurements within normal limits will improve Outcome: Progressing Goal: Will remain free from infection Outcome: Progressing Goal: Diagnostic test results will improve Outcome: Progressing Goal: Respiratory complications will improve Outcome: Progressing   Problem: Coping: Goal: Level of anxiety will decrease Outcome: Progressing   Problem: Pain Managment: Goal: General experience of comfort will improve Outcome: Progressing   Problem: Safety: Goal: Ability to remain free from injury will improve Outcome: Progressing  Elesa Hacker, RN

## 2019-04-22 NOTE — Progress Notes (Signed)
Patient went into afib with RVR. Patient states she just finished having a BM and was straining. Assisted patient back to bed and obtained EKG. BP 146/114 Patient states she feels chest tightness and that this episode feels worse to her than prior afib episodes during this admission. Notified Kathyrn Drown NP via phone. Orders given for 5 mg IV metoprolol x1. Sharee Pimple is currently at bedside assessing patient.

## 2019-04-22 NOTE — Progress Notes (Signed)
ANTICOAGULATION CONSULT NOTE  Pharmacy Consult for Heparin  Indication: chest pain/ACS/Afib  Patient Measurements: Height: 5\' 3"  (160 cm) Weight: 181 lb 9.6 oz (82.4 kg) IBW/kg (Calculated) : 52.4 Heparin Dosing Weight: 69 kg  Vital Signs: Temp: 97.7 F (36.5 C) (02/12 0557) Temp Source: Oral (02/12 0557) BP: 130/64 (02/12 1058) Pulse Rate: 72 (02/12 1058)  Labs: Recent Labs    04/20/19 0645 04/20/19 0645 04/21/19 1543 04/22/19 0126 04/22/19 0630 04/22/19 1147  HGB 13.0  --   --   --  12.4  --   HCT 39.9  --   --   --  37.7  --   PLT 302  --   --   --  249  --   APTT 124*   < > 57* 129*  --  93*  HEPARINUNFRC 0.98*  --   --   --   --  0.69  CREATININE 0.80  --   --   --   --   --    < > = values in this interval not displayed.    Estimated Creatinine Clearance: 57 mL/min (by C-G formula based on SCr of 0.8 mg/dL).  Assessment: 79 y/o F presenting with shortness of breath. PMH is extensive but significant for h/o bladder cancer, CAD, and abdominal aortic aneurysm from (2017). Pharmacy has been consulted to dose heparin IV infusion for suspected ACS/STEMI. She is also noted with new onset afib.  Cath showed multivessel CAD, LM disease, and AS.  Surgery was discussed - she declined and apixaban started for Afib and planned DC home.  Overnight she changed her mind and OR scheduled for Monday.  Will stop apixaban and restart heparin.  Will use aptt for heparin dosing given apixaban falsely elevated HL.   APTT now currently at goal.  No overt bleeding or complications noted.  Goal of Therapy:  APTT 66-102s Heparin level 0.3-0.7 units/ml Monitor platelets by anticoagulation protocol: Yes    Plan:  Continue IV heparin at current rate. Confirm PTT in 8 hrs. Daily heparin level,  APTT, and CBC.  [name], [facility] Clinical Pharmacist Phone [phone]  04/22/2019 1:01 PM

## 2019-04-22 NOTE — Progress Notes (Signed)
CARDIAC REHAB PHASE I   PRE:  Rate/Rhythm: 83 SR    BP: sitting 130/64    SaO2:   MODE:  Ambulation: 470 ft   POST:  Rate/Rhythm: 111 ST    BP: sitting 165/75     SaO2:   Quicker pace today. Steady. No c/o. When asked, she sts she has SOB walking. Better when she rests. 1700 mL on IS. She can walk independently in hall. [place], ACSM 04/22/2019 11:54 AM

## 2019-04-22 NOTE — Progress Notes (Signed)
ANTICOAGULATION CONSULT NOTE  Pharmacy Consult for Heparin  Indication: chest pain/ACS/Afib  Patient Measurements: Height: 5\' 3"  (160 cm) Weight: 181 lb 9.6 oz (82.4 kg) IBW/kg (Calculated) : 52.4 Heparin Dosing Weight: 69 kg  Vital Signs: Temp: 98 F (36.7 C) (02/12 2142) Temp Source: Oral (02/12 2142) BP: 109/66 (02/12 2142) Pulse Rate: 72 (02/12 2142)  Labs: Recent Labs    04/20/19 0645 04/21/19 1543 04/22/19 0126 04/22/19 0630 04/22/19 1147 04/22/19 1341 04/22/19 2131  HGB 13.0  --   --  12.4  --   --   --   HCT 39.9  --   --  37.7  --   --   --   PLT 302  --   --  249  --   --   --   APTT 124*   < >   < >  --  93* 103* 79*  HEPARINUNFRC 0.98*  --   --   --  0.69  --   --   CREATININE 0.80  --   --   --   --   --   --    < > = values in this interval not displayed.    Estimated Creatinine Clearance: 57 mL/min (by C-G formula based on SCr of 0.8 mg/dL).  Assessment: 79 y/o F presenting with shortness of breath. PMH is extensive but significant for h/o bladder cancer, CAD, and abdominal aortic aneurysm from (2017). Pharmacy has been consulted to dose heparin IV infusion for suspected ACS/STEMI. She is also noted with new onset afib.  Cath showed multivessel CAD, LM disease, and AS.  Surgery was discussed - she declined and apixaban started for Afib and planned DC home.  Overnight she changed her mind and OR scheduled for Monday.  Will stop apixaban and restart heparin.  Will use aptt for heparin dosing given apixaban falsely elevated HL.   aPTT is therapeutic at 79 sec.  No overt bleeding or complications noted.  Goal of Therapy:  APTT 66-102s Heparin level 0.3-0.7 units/ml Monitor platelets by anticoagulation protocol: Yes    Plan:  Continue IV heparin at 1000 units/hr Daily heparin level,  aPTT, and CBC Monitor for s/sx of bleeding  Thank you for involving pharmacy in this patient's care.  Renold Genta, PharmD, BCPS Clinical Pharmacist Clinical phone  for 04/22/2019 until 10p is [phone] 04/22/2019 10:20 PM  **Pharmacist phone directory can be found on [email or web address] listed under [place]**

## 2019-04-23 DIAGNOSIS — I5021 Acute systolic (congestive) heart failure: Secondary | ICD-10-CM

## 2019-04-23 DIAGNOSIS — I2510 Atherosclerotic heart disease of native coronary artery without angina pectoris: Secondary | ICD-10-CM

## 2019-04-23 LAB — CBC
HCT: 37.7 % (ref 36.0–46.0)
Hemoglobin: 12.3 g/dL (ref 12.0–15.0)
MCH: 31.4 pg (ref 26.0–34.0)
MCHC: 32.6 g/dL (ref 30.0–36.0)
MCV: 96.2 fL (ref 80.0–100.0)
Platelets: 283 10*3/uL (ref 150–400)
RBC: 3.92 MIL/uL (ref 3.87–5.11)
RDW: 13.3 % (ref 11.5–15.5)
WBC: 7.3 10*3/uL (ref 4.0–10.5)
nRBC: 0 % (ref 0.0–0.2)

## 2019-04-23 LAB — APTT: aPTT: 119 seconds — ABNORMAL HIGH (ref 24–36)

## 2019-04-23 LAB — HEPARIN LEVEL (UNFRACTIONATED): Heparin Unfractionated: 0.66 IU/mL (ref 0.30–0.70)

## 2019-04-23 MED ORDER — MILRINONE LACTATE IN DEXTROSE 20-5 MG/100ML-% IV SOLN
0.3000 ug/kg/min | INTRAVENOUS | Status: DC
  Filled 2019-04-23: qty 100

## 2019-04-23 MED ORDER — INSULIN REGULAR(HUMAN) IN NACL 100-0.9 UT/100ML-% IV SOLN
INTRAVENOUS | Status: AC
  Administered 2019-04-25: .8 [IU]/h via INTRAVENOUS
  Filled 2019-04-23: qty 100

## 2019-04-23 MED ORDER — MANNITOL 20 % IV SOLN
INTRAVENOUS | Status: DC
  Filled 2019-04-23: qty 13

## 2019-04-23 MED ORDER — TRANEXAMIC ACID (OHS) PUMP PRIME SOLUTION
2.0000 mg/kg | INTRAVENOUS | Status: DC
  Filled 2019-04-23: qty 1.65

## 2019-04-23 MED ORDER — SODIUM CHLORIDE 0.9 % IV SOLN
1.5000 g | INTRAVENOUS | Status: AC
  Administered 2019-04-25: 1.5 g via INTRAVENOUS
  Filled 2019-04-23: qty 1.5

## 2019-04-23 MED ORDER — VANCOMYCIN HCL 1500 MG/300ML IV SOLN
1500.0000 mg | INTRAVENOUS | Status: AC
  Administered 2019-04-25: 1500 mg via INTRAVENOUS
  Filled 2019-04-23: qty 300

## 2019-04-23 MED ORDER — AMIODARONE HCL 200 MG PO TABS
200.0000 mg | ORAL_TABLET | Freq: Two times a day (BID) | ORAL | Status: DC
  Administered 2019-04-23 – 2019-04-24 (×4): 200 mg via ORAL
  Filled 2019-04-23 (×4): qty 1

## 2019-04-23 MED ORDER — NOREPINEPHRINE 4 MG/250ML-% IV SOLN
0.0000 ug/min | INTRAVENOUS | Status: DC
  Filled 2019-04-23: qty 250

## 2019-04-23 MED ORDER — SODIUM CHLORIDE 0.9 % IV SOLN
INTRAVENOUS | Status: DC
  Filled 2019-04-23: qty 30

## 2019-04-23 MED ORDER — PHENYLEPHRINE HCL-NACL 20-0.9 MG/250ML-% IV SOLN
30.0000 ug/min | INTRAVENOUS | Status: AC
  Administered 2019-04-25: 20 ug/min via INTRAVENOUS
  Filled 2019-04-23: qty 250

## 2019-04-23 MED ORDER — DILTIAZEM HCL 60 MG PO TABS
30.0000 mg | ORAL_TABLET | Freq: Four times a day (QID) | ORAL | Status: DC
  Administered 2019-04-23 – 2019-04-25 (×8): 30 mg via ORAL
  Filled 2019-04-23 (×8): qty 1

## 2019-04-23 MED ORDER — POTASSIUM CHLORIDE 2 MEQ/ML IV SOLN
80.0000 meq | INTRAVENOUS | Status: DC
  Filled 2019-04-23: qty 40

## 2019-04-23 MED ORDER — TRANEXAMIC ACID (OHS) BOLUS VIA INFUSION
15.0000 mg/kg | INTRAVENOUS | Status: AC
  Administered 2019-04-25: 1236 mg via INTRAVENOUS
  Filled 2019-04-23: qty 1236

## 2019-04-23 MED ORDER — NITROGLYCERIN IN D5W 200-5 MCG/ML-% IV SOLN
2.0000 ug/min | INTRAVENOUS | Status: DC
  Filled 2019-04-23: qty 250

## 2019-04-23 MED ORDER — SODIUM CHLORIDE 0.9 % IV SOLN
750.0000 mg | INTRAVENOUS | Status: AC
  Administered 2019-04-25: 750 mg via INTRAVENOUS
  Filled 2019-04-23: qty 750

## 2019-04-23 MED ORDER — PLASMA-LYTE 148 IV SOLN
INTRAVENOUS | Status: DC
  Filled 2019-04-23: qty 2.5

## 2019-04-23 MED ORDER — EPINEPHRINE HCL 5 MG/250ML IV SOLN IN NS
0.0000 ug/min | INTRAVENOUS | Status: DC
  Filled 2019-04-23: qty 250

## 2019-04-23 MED ORDER — TRANEXAMIC ACID 1000 MG/10ML IV SOLN
1.5000 mg/kg/h | INTRAVENOUS | Status: AC
  Administered 2019-04-25: 1.5 mg/kg/h via INTRAVENOUS
  Filled 2019-04-23: qty 25

## 2019-04-23 MED ORDER — DEXMEDETOMIDINE HCL IN NACL 400 MCG/100ML IV SOLN
0.1000 ug/kg/h | INTRAVENOUS | Status: AC
  Administered 2019-04-25: .5 ug/kg/h via INTRAVENOUS
  Filled 2019-04-23: qty 100

## 2019-04-23 NOTE — Plan of Care (Signed)
  Problem: Activity: Goal: Capacity to carry out activities will improve Outcome: Progressing   Problem: Clinical Measurements: Goal: Will remain free from infection Outcome: Progressing Goal: Diagnostic test results will improve Outcome: Progressing Goal: Respiratory complications will improve Outcome: Progressing Goal: Cardiovascular complication will be avoided Outcome: Progressing   Problem: Pain Managment: Goal: General experience of comfort will improve Outcome: Progressing   Problem: Safety: Goal: Ability to remain free from injury will improve Outcome: Progressing   Elesa Hacker, RN

## 2019-04-23 NOTE — Progress Notes (Signed)
ANTICOAGULATION CONSULT NOTE  Pharmacy Consult for Heparin  Indication: chest pain/ACS/Afib  Patient Measurements: Height: 5\' 3"  (160 cm) Weight: 181 lb 9.6 oz (82.4 kg) IBW/kg (Calculated) : 52.4 Heparin Dosing Weight: 69 kg  Vital Signs: Temp: 98 F (36.7 C) (02/13 0700) Temp Source: Oral (02/13 0700) BP: 110/70 (02/13 0700)  Labs: Recent Labs    04/22/19 0126 04/22/19 0630 04/22/19 1147 04/22/19 1147 04/22/19 1341 04/22/19 2131 04/23/19 0432  HGB  --  12.4  --   --   --   --  12.3  HCT  --  37.7  --   --   --   --  37.7  PLT  --  249  --   --   --   --  283  APTT   < >  --  93*   < > 103* 79* 119*  HEPARINUNFRC  --   --  0.69  --   --   --  0.66   < > = values in this interval not displayed.    Estimated Creatinine Clearance: 57 mL/min (by C-G formula based on SCr of 0.8 mg/dL).  Assessment: 79 y/o F presenting with shortness of breath. PMH is extensive but significant for h/o bladder cancer, CAD, and abdominal aortic aneurysm from (2017). Pharmacy has been consulted to dose heparin IV infusion for suspected ACS/STEMI. She is also noted with new onset afib.  Cath showed multivessel CAD, LM disease, and AS. Plans are for CABG on Monday. She was on apixaban with last dose on the evening of 2/10.  -heparin level at goal  Goal of Therapy:  APTT 66-102s Heparin level 0.3-0.7 units/ml Monitor platelets by anticoagulation protocol: Yes    Plan:  Continue IV heparin at 1000 units/hr Daily heparin level and CBC  Hildred Laser, PharmD Clinical Pharmacist **Pharmacist phone directory can now be found on [email or web address] ([email or web address]).  Listed under [place].

## 2019-04-23 NOTE — Progress Notes (Signed)
Progress Note  Patient Name: Gabriela Smith Date of Encounter: 04/23/2019  Primary Cardiologist: Minus Breeding, MD   Subjective   No chest pain or SOB, new to afib, a little worried about CABG  Inpatient Medications    Scheduled Meds: . aspirin EC  81 mg Oral Daily  . B-complex with vitamin C  1 tablet Oral Daily  . bumetanide  1 mg Oral Daily  . cholecalciferol  1,000 Units Oral Daily  . [START ON 04/25/2019] epinephrine  0-10 mcg/min Intravenous To OR  . gabapentin  100 mg Oral QHS  . [START ON 04/25/2019] heparin-papaverine-plasmalyte irrigation   Irrigation To OR  . [START ON 04/25/2019] insulin   Intravenous To OR  . [START ON 04/25/2019] [facility] Blood Cardioplegia vial (lidocaine/magnesium/mannitol 0.26g-4g-6.4g)   Intracoronary To OR  . metoprolol succinate  100 mg Oral Daily  . mupirocin ointment  1 application Nasal BID  . [START ON 04/25/2019] phenylephrine  30-200 mcg/min Intravenous To OR  . [START ON 04/25/2019] potassium chloride  80 mEq Other To OR  . potassium chloride  40 mEq Oral Daily  . sodium chloride flush  3 mL Intravenous Q12H  . sodium chloride flush  3 mL Intravenous Q12H  . [START ON 04/25/2019] tranexamic acid  15 mg/kg Intravenous To OR  . [START ON 04/25/2019] tranexamic acid  2 mg/kg Intracatheter To OR  . vitamin B-12  500 mcg Oral Daily   Continuous Infusions: . sodium chloride    . sodium chloride    . [START ON 04/25/2019] cefUROXime (ZINACEF)  IV    . [START ON 04/25/2019] cefUROXime (ZINACEF)  IV    . [START ON 04/25/2019] dexmedetomidine    . diltiazem (CARDIZEM) infusion 5 mg/hr (04/22/19 1600)  . [START ON 04/25/2019] heparin 30,000 units/NS 1000 mL solution for CELLSAVER    . heparin 1,000 Units/hr (04/23/19 0906)  . [START ON 04/25/2019] milrinone    . [START ON 04/25/2019] nitroGLYCERIN    . [START ON 04/25/2019] norepinephrine    . [START ON 04/25/2019] tranexamic acid (CYKLOKAPRON) infusion ([facility])    . [START ON 04/25/2019] vancomycin      PRN Meds: sodium chloride, acetaminophen, lip balm, nitroGLYCERIN, ondansetron (ZOFRAN) IV, sodium chloride flush   Vital Signs    Vitals:   04/22/19 1740 04/22/19 1810 04/22/19 2142 04/23/19 0700  BP: (!) 107/59 117/77 109/66 110/70  Pulse:   72   Resp:   16   Temp:   98 F (36.7 C) 98 F (36.7 C)  TempSrc:   Oral Oral  SpO2:   97% 97%  Weight:      Height:        Intake/Output Summary (Last 24 hours) at 04/23/2019 0931 Last data filed at 04/23/2019 0730 Gross per 24 hour  Intake 504.18 ml  Output 1500 ml  Net -995.82 ml   Last 3 Weights 04/22/2019 04/21/2019 04/20/2019  Weight (lbs) 181 lb 9.6 oz 168 lb 10.4 oz 182 lb 1.6 oz  Weight (kg) 82.373 kg 76.5 kg 82.6 kg      Telemetry    Atrial fib, RVR - Personally Reviewed  ECG    NA  Physical Exam   General: Well developed, well nourished, female in no acute distress Head: Eyes PERRLA, Head normocephalic and atraumatic Lungs: clear bilaterally to auscultation. Heart: Irreg R&R S1 S2, without rub or gallop. soft murmur. 4/4 extremity pulses are 2+ & equal. No JVD. Abdomen: Bowel sounds are present, abdomen soft and non-tender without masses  or  hernias noted. Msk: Normal strength and tone for age. Extremities: No clubbing, cyanosis or edema.    Skin:  No rashes or lesions noted. Neuro: Alert and oriented X 3. Psych:  Good affect, responds appropriately  Labs    High Sensitivity Troponin:   Recent Labs  Lab 04/15/19 1214 04/15/19 1423 04/15/19 2313 04/16/19 0057 04/16/19 0834  TROPONINIHS 156* 167* 340* 493* 440*      Chemistry Recent Labs  Lab 04/18/19 0314 04/19/19 0410 04/20/19 0645  NA 139 138 138  K 3.9 3.8 3.8  CL 104 101 104  CO2 24 24 21*  GLUCOSE 103* 103* 118*  BUN 17 16 16   CREATININE 0.85 0.83 0.80  CALCIUM 9.1 9.3 9.5  GFRNONAA >60 >60 >60  GFRAA >60 >60 >60  ANIONGAP 11 13 13      Hematology Recent Labs  Lab 04/20/19 0645 04/22/19 0630 04/23/19 0432  WBC 6.9 6.8 7.3   RBC 4.12 3.88 3.92  HGB 13.0 12.4 12.3  HCT 39.9 37.7 37.7  MCV 96.8 97.2 96.2  MCH 31.6 32.0 31.4  MCHC 32.6 32.9 32.6  RDW 13.8 13.4 13.3  PLT 302 249 283    BNP No results for input(s): BNP, PROBNP in the last 168 hours.   DDimer  No results for input(s): DDIMER in the last 168 hours.   Radiology    No results found.  Cardiac Studies   Echocardiogram 04/16/2019  1. Left ventricular ejection fraction, by visual estimation, is 60 to  65%. The left ventricle has normal function. There is no left ventricular  hypertrophy.  2. The left ventricle has no regional wall motion abnormalities.  3. Left ventricular diastolic function could not be evaluated.  4. Global right ventricle has normal systolic function.The right  ventricular size is normal. No increase in right ventricular wall  thickness.  5. Left atrial size was mildly dilated.  6. Right atrial size was normal.  7. Mild calcification of the anterior mitral valve leaflet(s). Mild  thickening of the anterior mitral valve leaflet(s). Moderate mitral  annular calcification. Mild to moderate mitral valve regurgitation. No  evidence of mitral stenosis.  8. The tricuspid valve is normal in structure. Tricuspid valve  regurgitation is mild.  9. The aortic valve was not well visualized. Aortic valve regurgitation  is not visualized. No evidence of aortic valve sclerosis or stenosis.  10. The pulmonic valve was normal in structure. Pulmonic valve  regurgitation is not visualized.  11. Normal pulmonary artery systolic pressure.  12. The inferior vena cava is normal in size with greater than 50%  respiratory variability, suggesting right atrial pressure of 3 mmHg.  13. Small pericardial effusion.  14. The pericardial effusion is anterior to the right ventricle.  15. There appears to be some degree of aortic stenosis but doppler  interrogation of the AV was not done. Recommend repeat limited study of  the AV to  assess for aortic stenosis.    04/19/19   1. Left ventricular ejection fraction, by estimation, is 60 to 65%. The  left ventricle has normal function. The left ventrical has no regional  wall motion abnormalities. Left ventricular diastolic function could not  be evaluated. Not assessed.  2. Right ventricular systolic function is normal. The right ventricular  size is normal.  3. Left atrial size was mildly dilated.  4. Mild mitral valve regurgitation.  5. Although the mean AVG is low and consistent with mild AS, the Stroke  volume index is low  at 27.72 and the dimensionless index is 0.44 more  consistent with moderate AS. This likely represents low flow low gradient  moderate aortic stenosis. . The  aortic valve has an indeterminant number of cusps. Aortic valve  regurgitation is not visualized. Aortic valve area, by VTI measures 1.37  cm. Aortic valve mean gradient measures 11.2 mmHg.  6. The inferior vena cava is normal in size with greater than 50%  respiratory variability, suggesting right atrial pressure of 3 mmHg.    Diagnostic cath  04/20/19 Dominance: Right      Patient Profile     79 y.o. female with a hx of CAD with PTCA in 1993 and neg nuc in 2017, AAA, Bladder cancer, HLD, HTN and arthritis now presents with SOB, weight gain.  No chest pain.  Troponin 156->167->340->493.  ECG sinus with ectopy.  BNP 841.  Assessment & Plan    Acute diastolic heart failure - continue Lasix - wt down 6 lbs from admit  Paroxysmal atrial fibrillation with RVR, new onset - on IV Cardizem at 5 mg/hr, BP will not allow titration - home dose Lopressor 50 mg bid>>Toprol XL 100 mg qd - will change Cardizem to 30 mg q 6 hr, preferentially give BB - BP is borderline, make sure meds are given  CAD - for CABG Monday  HLD - statin intol>>Lipid clinic after d/c  Aortic stenosis - moderate, monitor   Hypertension - amlodipine d/c'd for Cardizem - good control  For questions  or updates, please contact [facility] Please consult [email or web address] for contact info under        Signed, Rosaria Ferries, PA-C  04/23/2019, 9:31 AM

## 2019-04-23 NOTE — Progress Notes (Signed)
CARDIAC REHAB PHASE I   PRE:  Rate/Rhythm: 114 afib    BP: sitting 108/72    SaO2:   MODE:  Ambulation: 450 ft   POST:  Rate/Rhythm: 121 afib    BP: sitting 113/91     SaO2:   Pt feeling well. Able to walk without issues despite afib. Encouraged more walking and IS. Reviewed surgery expectations. [place], ACSM 04/23/2019 12:29 PM

## 2019-04-24 ENCOUNTER — Inpatient Hospital Stay (HOSPITAL_COMMUNITY): Payer: Medicare Other

## 2019-04-24 LAB — BLOOD GAS, ARTERIAL
Acid-base deficit: 0.4 mmol/L (ref 0.0–2.0)
Bicarbonate: 23 mmol/L (ref 20.0–28.0)
FIO2: 21
O2 Saturation: 97 %
Patient temperature: 37
pCO2 arterial: 33.6 mmHg (ref 32.0–48.0)
pH, Arterial: 7.451 — ABNORMAL HIGH (ref 7.350–7.450)
pO2, Arterial: 85.2 mmHg (ref 83.0–108.0)

## 2019-04-24 LAB — URINALYSIS, ROUTINE W REFLEX MICROSCOPIC
Bilirubin Urine: NEGATIVE
Glucose, UA: NEGATIVE mg/dL
Hgb urine dipstick: NEGATIVE
Ketones, ur: NEGATIVE mg/dL
Nitrite: NEGATIVE
Protein, ur: NEGATIVE mg/dL
Specific Gravity, Urine: 1.019 (ref 1.005–1.030)
WBC, UA: >50 WBC/hpf — ABNORMAL HIGH (ref 0–5)
pH: 5 (ref 5.0–8.0)

## 2019-04-24 LAB — CBC
HCT: 37.7 % (ref 36.0–46.0)
Hemoglobin: 12.2 g/dL (ref 12.0–15.0)
MCH: 31.6 pg (ref 26.0–34.0)
MCHC: 32.4 g/dL (ref 30.0–36.0)
MCV: 97.7 fL (ref 80.0–100.0)
Platelets: 272 10*3/uL (ref 150–400)
RBC: 3.86 MIL/uL — ABNORMAL LOW (ref 3.87–5.11)
RDW: 13.3 % (ref 11.5–15.5)
WBC: 6.9 10*3/uL (ref 4.0–10.5)
nRBC: 0 % (ref 0.0–0.2)

## 2019-04-24 LAB — HEMOGLOBIN A1C
Hgb A1c MFr Bld: 5.7 % — ABNORMAL HIGH (ref 4.8–5.6)
Mean Plasma Glucose: 116.89 mg/dL

## 2019-04-24 LAB — PREPARE RBC (CROSSMATCH)

## 2019-04-24 LAB — PROTIME-INR
INR: 1 (ref 0.8–1.2)
Prothrombin Time: 12.6 seconds (ref 11.4–15.2)

## 2019-04-24 LAB — HEPARIN LEVEL (UNFRACTIONATED): Heparin Unfractionated: 0.6 IU/mL (ref 0.30–0.70)

## 2019-04-24 LAB — ABO/RH: ABO/RH(D): O NEG

## 2019-04-24 MED ORDER — BISACODYL 5 MG PO TBEC: 5.0000 mg | DELAYED_RELEASE_TABLET | Freq: Once | ORAL | Status: DC

## 2019-04-24 MED ORDER — CHLORHEXIDINE GLUCONATE CLOTH 2 % EX PADS
6.0000 | MEDICATED_PAD | Freq: Once | CUTANEOUS | Status: AC
  Administered 2019-04-24: 6 via TOPICAL

## 2019-04-24 MED ORDER — METOPROLOL TARTRATE 12.5 MG HALF TABLET
12.5000 mg | ORAL_TABLET | Freq: Once | ORAL | Status: AC
  Administered 2019-04-25: 12.5 mg via ORAL
  Filled 2019-04-24: qty 1

## 2019-04-24 MED ORDER — CHLORHEXIDINE GLUCONATE CLOTH 2 % EX PADS
6.0000 | MEDICATED_PAD | Freq: Once | CUTANEOUS | Status: AC
  Administered 2019-04-25: 6 via TOPICAL

## 2019-04-24 MED ORDER — CHLORHEXIDINE GLUCONATE 0.12 % MT SOLN
15.0000 mL | Freq: Once | OROMUCOSAL | Status: AC
  Administered 2019-04-25: 15 mL via OROMUCOSAL
  Filled 2019-04-24: qty 15

## 2019-04-24 MED ORDER — TEMAZEPAM 15 MG PO CAPS: 15.0000 mg | ORAL_CAPSULE | Freq: Once | ORAL | Status: DC | PRN

## 2019-04-24 NOTE — Progress Notes (Signed)
Spoke to [employer] in [employer], she states there is 2 units of blood ready for patient.

## 2019-04-24 NOTE — Progress Notes (Signed)
ANTICOAGULATION CONSULT NOTE  Pharmacy Consult for Heparin  Indication: chest pain/ACS/Afib  Patient Measurements: Height: 5\' 3"  (160 cm) Weight: 181 lb 9.6 oz (82.4 kg) IBW/kg (Calculated) : 52.4 Heparin Dosing Weight: 69 kg  Vital Signs: Temp: 97.9 F (36.6 C) (02/14 0457) Temp Source: Oral (02/14 0457) BP: 135/66 (02/14 0457) Pulse Rate: 65 (02/14 0457)  Labs: Recent Labs    04/22/19 0126 04/22/19 0630 04/22/19 0630 04/22/19 1147 04/22/19 1147 04/22/19 1341 04/22/19 2131 04/23/19 0432 04/24/19 0751  HGB  --  12.4   < >  --   --   --   --  12.3 12.2  HCT  --  37.7  --   --   --   --   --  37.7 37.7  PLT  --  249  --   --   --   --   --  283 272  APTT   < >  --   --  93*   < > 103* 79* 119*  --   HEPARINUNFRC  --   --   --  0.69  --   --   --  0.66 0.60   < > = values in this interval not displayed.    Estimated Creatinine Clearance: 57 mL/min (by C-G formula based on SCr of 0.8 mg/dL).  Assessment: 79 y/o F presenting with shortness of breath. PMH is extensive but significant for h/o bladder cancer, CAD, and abdominal aortic aneurysm from (2017). Pharmacy has been consulted to dose heparin IV infusion for suspected ACS/STEMI. She is also noted with new onset afib (now NSR).  Cath showed multivessel CAD, LM disease, and AS. Plans are for CABG on Monday. She was on apixaban with last dose on the evening of 2/10.  -heparin level at goal  Goal of Therapy:  APTT 66-102s Heparin level 0.3-0.7 units/ml Monitor platelets by anticoagulation protocol: Yes    Plan:  Continue IV heparin at 1000 units/hr Daily heparin level and CBC  Hildred Laser, PharmD Clinical Pharmacist **Pharmacist phone directory can now be found on [email or web address] ([email or web address]).  Listed under [place].

## 2019-04-24 NOTE — Progress Notes (Signed)
5 Days Post-Op Procedure(s) (LRB): LEFT HEART CATH AND CORONARY ANGIOGRAPHY (N/A) Subjective: Patient ready for CABG in am- preoperative procedure reviewed andall questions answered Orders in place No chest pain today  Objective: Vital signs in last 24 hours: Temp:  [97.9 F (36.6 C)] 97.9 F (36.6 C) (02/14 0457) Pulse Rate:  [65-70] 70 (02/14 1044) Cardiac Rhythm: Normal sinus rhythm;Heart block (02/14 0704) Resp:  [16] 16 (02/14 0457) BP: (126-149)/(63-67) 149/63 (02/14 1217) SpO2:  [97 %-98 %] 98 % (02/14 0457) Weight:  [82.4 kg] 82.4 kg (02/14 0457)  Hemodynamic parameters for last 24 hours:   nsr Intake/Output from previous day: 02/13 0701 - 02/14 0700 In: 939.6 [P.O.:600; I.V.:339.6] Out: -  Intake/Output this shift: Total I/O In: 601 [P.O.:598; I.V.:3] Out: -   Lungs clear Neuro intact  Lab Results: Recent Labs    04/23/19 0432 04/24/19 0751  WBC 7.3 6.9  HGB 12.3 12.2  HCT 37.7 37.7  PLT 283 272   BMET: No results for input(s): NA, K, CL, CO2, GLUCOSE, BUN, CREATININE, CALCIUM in the last 72 hours.  PT/INR: No results for input(s): LABPROT, INR in the last 72 hours. ABG No results found for: PHART, HCO3, TCO2, ACIDBASEDEF, O2SAT CBG (last 3)  No results for input(s): GLUCAP in the last 72 hours.  Assessment/Plan: S/P Procedure(s) (LRB): LEFT HEART CATH AND CORONARY ANGIOGRAPHY (N/A) CABG in am by Dr Kipp Brood   LOS: 9 days    Tharon Aquas Trigt III 04/24/2019

## 2019-04-24 NOTE — Progress Notes (Signed)
Progress Note  Patient Name: Gabriela Smith Date of Encounter: 04/24/2019  Primary Cardiologist: Minus Breeding, MD   Subjective   Just went to bathroom No angina Her daughter has had CABG at age 81 with [place] Cardiac genes run on her side of family   Inpatient Medications    Scheduled Meds: . amiodarone  200 mg Oral BID  . aspirin EC  81 mg Oral Daily  . B-complex with vitamin C  1 tablet Oral Daily  . bumetanide  1 mg Oral Daily  . cholecalciferol  1,000 Units Oral Daily  . diltiazem  30 mg Oral Q6H  . [START ON 04/25/2019] epinephrine  0-10 mcg/min Intravenous To OR  . gabapentin  100 mg Oral QHS  . [START ON 04/25/2019] heparin-papaverine-plasmalyte irrigation   Irrigation To OR  . [START ON 04/25/2019] insulin   Intravenous To OR  . [START ON 04/25/2019] [facility] Blood Cardioplegia vial (lidocaine/magnesium/mannitol 0.26g-4g-6.4g)   Intracoronary To OR  . metoprolol succinate  100 mg Oral Daily  . mupirocin ointment  1 application Nasal BID  . [START ON 04/25/2019] phenylephrine  30-200 mcg/min Intravenous To OR  . [START ON 04/25/2019] potassium chloride  80 mEq Other To OR  . potassium chloride  40 mEq Oral Daily  . sodium chloride flush  3 mL Intravenous Q12H  . sodium chloride flush  3 mL Intravenous Q12H  . [START ON 04/25/2019] tranexamic acid  15 mg/kg Intravenous To OR  . [START ON 04/25/2019] tranexamic acid  2 mg/kg Intracatheter To OR  . vitamin B-12  500 mcg Oral Daily   Continuous Infusions: . sodium chloride    . sodium chloride    . [START ON 04/25/2019] cefUROXime (ZINACEF)  IV    . [START ON 04/25/2019] cefUROXime (ZINACEF)  IV    . [START ON 04/25/2019] dexmedetomidine    . [START ON 04/25/2019] heparin 30,000 units/NS 1000 mL solution for CELLSAVER    . heparin 1,000 Units/hr (04/24/19 0500)  . [START ON 04/25/2019] milrinone    . [START ON 04/25/2019] nitroGLYCERIN    . [START ON 04/25/2019] norepinephrine    . [START ON 04/25/2019] tranexamic acid  (CYKLOKAPRON) infusion ([facility])    . [START ON 04/25/2019] vancomycin     PRN Meds: sodium chloride, acetaminophen, lip balm, nitroGLYCERIN, ondansetron (ZOFRAN) IV, sodium chloride flush   Vital Signs    Vitals:   04/23/19 1231 04/23/19 1736 04/23/19 2208 04/24/19 0457  BP: (!) 119/97 126/67 139/63 135/66  Pulse: 89  65 65  Resp:   16 16  Temp:   97.9 F (36.6 C) 97.9 F (36.6 C)  TempSrc:   Oral Oral  SpO2:   97% 98%  Weight:    82.4 kg  Height:        Intake/Output Summary (Last 24 hours) at 04/24/2019 [id number] Last data filed at 04/24/2019 0912 Gross per 24 hour  Intake 1177.59 ml  Output --  Net 1177.59 ml   Last 3 Weights 04/24/2019 04/22/2019 04/21/2019  Weight (lbs) 181 lb 9.6 oz 181 lb 9.6 oz 168 lb 10.4 oz  Weight (kg) 82.373 kg 82.373 kg 76.5 kg      Telemetry    Atrial fib, RVR - Personally Reviewed  ECG    NA  Physical Exam   General: Well developed, well nourished, female in no acute distress Head: Eyes PERRLA, Head normocephalic and atraumatic Lungs: clear bilaterally to auscultation. Heart: Irreg R&R S1 S2, without rub or gallop. soft murmur.  4/4 extremity pulses are 2+ & equal. No JVD. Abdomen: Bowel sounds are present, abdomen soft and non-tender without masses or  hernias noted. Msk: Normal strength and tone for age. Extremities: No clubbing, cyanosis or edema.    Skin:  No rashes or lesions noted. Neuro: Alert and oriented X 3. Psych:  Good affect, responds appropriately  Labs    High Sensitivity Troponin:   Recent Labs  Lab 04/15/19 1214 04/15/19 1423 04/15/19 2313 04/16/19 0057 04/16/19 0834  TROPONINIHS 156* 167* 340* 493* 440*      Chemistry Recent Labs  Lab 04/18/19 0314 04/19/19 0410 04/20/19 0645  NA 139 138 138  K 3.9 3.8 3.8  CL 104 101 104  CO2 24 24 21*  GLUCOSE 103* 103* 118*  BUN 17 16 16   CREATININE 0.85 0.83 0.80  CALCIUM 9.1 9.3 9.5  GFRNONAA >60 >60 >60  GFRAA >60 >60 >60  ANIONGAP 11 13 13       Hematology Recent Labs  Lab 04/22/19 0630 04/23/19 0432 04/24/19 0751  WBC 6.8 7.3 6.9  RBC 3.88 3.92 3.86*  HGB 12.4 12.3 12.2  HCT 37.7 37.7 37.7  MCV 97.2 96.2 97.7  MCH 32.0 31.4 31.6  MCHC 32.9 32.6 32.4  RDW 13.4 13.3 13.3  PLT 249 283 272    BNP No results for input(s): BNP, PROBNP in the last 168 hours.   DDimer  No results for input(s): DDIMER in the last 168 hours.   Radiology    No results found.  Cardiac Studies   Echocardiogram 04/16/2019  1. Left ventricular ejection fraction, by visual estimation, is 60 to  65%. The left ventricle has normal function. There is no left ventricular  hypertrophy.  2. The left ventricle has no regional wall motion abnormalities.  3. Left ventricular diastolic function could not be evaluated.  4. Global right ventricle has normal systolic function.The right  ventricular size is normal. No increase in right ventricular wall  thickness.  5. Left atrial size was mildly dilated.  6. Right atrial size was normal.  7. Mild calcification of the anterior mitral valve leaflet(s). Mild  thickening of the anterior mitral valve leaflet(s). Moderate mitral  annular calcification. Mild to moderate mitral valve regurgitation. No  evidence of mitral stenosis.  8. The tricuspid valve is normal in structure. Tricuspid valve  regurgitation is mild.  9. The aortic valve was not well visualized. Aortic valve regurgitation  is not visualized. No evidence of aortic valve sclerosis or stenosis.  10. The pulmonic valve was normal in structure. Pulmonic valve  regurgitation is not visualized.  11. Normal pulmonary artery systolic pressure.  12. The inferior vena cava is normal in size with greater than 50%  respiratory variability, suggesting right atrial pressure of 3 mmHg.  13. Small pericardial effusion.  14. The pericardial effusion is anterior to the right ventricle.  15. There appears to be some degree of aortic stenosis but  doppler  interrogation of the AV was not done. Recommend repeat limited study of  the AV to assess for aortic stenosis.    04/19/19   1. Left ventricular ejection fraction, by estimation, is 60 to 65%. The  left ventricle has normal function. The left ventrical has no regional  wall motion abnormalities. Left ventricular diastolic function could not  be evaluated. Not assessed.  2. Right ventricular systolic function is normal. The right ventricular  size is normal.  3. Left atrial size was mildly dilated.  4. Mild mitral valve regurgitation.  5. Although the mean AVG is low and consistent with mild AS, the Stroke  volume index is low at 27.72 and the dimensionless index is 0.44 more  consistent with moderate AS. This likely represents low flow low gradient  moderate aortic stenosis. . The  aortic valve has an indeterminant number of cusps. Aortic valve  regurgitation is not visualized. Aortic valve area, by VTI measures 1.37  cm. Aortic valve mean gradient measures 11.2 mmHg.  6. The inferior vena cava is normal in size with greater than 50%  respiratory variability, suggesting right atrial pressure of 3 mmHg.    Diagnostic cath  04/20/19 Dominance: Right      Patient Profile     79 y.o. female with a hx of CAD with PTCA in 1993 and neg nuc in 2017, AAA, Bladder cancer, HLD, HTN and arthritis now presents with SOB, weight gain.  No chest pain.  Troponin 156->167->340->493.  ECG sinus with ectopy.  BNP 841.  Assessment & Plan    Acute diastolic heart failure -  Improved continue lasix   Paroxysmal atrial fibrillation with RVR, new onset - NSR this am may need iv amiodarone post CABG   CAD - for CABG Monday she appears ready pre CABG dopplers ok and EF normal by TTE   HLD - statin intol>>Lipid clinic after d/c  Aortic stenosis - decision by CVTS as to weather to do AVR However given age and mean gradient of 11 mmHg suspect not Especially since we have TAVR as  bailout   Hypertension - amlodipine d/c'd for Cardizem - good control  For questions or updates, please contact [facility] Please consult [email or web address] for contact info under        Signed, Jenkins Rouge, MD  04/24/2019, 9:21 AM

## 2019-04-24 NOTE — Anesthesia Preprocedure Evaluation (Addendum)
Anesthesia Evaluation  Patient identified by MRN, date of birth, ID band Patient awake    Reviewed: Allergy & Precautions, NPO status , Patient's Chart, lab work & pertinent test results  Airway Mallampati: I  TM Distance: <3 FB Neck ROM: Full    Dental  (+) Teeth Intact, Dental Advisory Given   Pulmonary former smoker,    Pulmonary exam normal breath sounds clear to auscultation       Cardiovascular hypertension, Pt. on home beta blockers and Pt. on medications + CAD, + Peripheral Vascular Disease (AAA) and +CHF  + dysrhythmias Atrial Fibrillation + Valvular Problems/Murmurs AS  Rhythm:Regular Rate:Normal + Systolic murmurs Echo 0000000: 1. Left ventricular ejection fraction, by estimation, is 60 to 65%. The  left ventricle has normal function. The left ventrical has no regional  wall motion abnormalities. Left ventricular diastolic function could not  be evaluated. Not assessed.  2. Right ventricular systolic function is normal. The right ventricular  size is normal.  3. Left atrial size was mildly dilated.  4. Mild mitral valve regurgitation.  5. Although the mean AVG is low and consistent with mild AS, the Stroke  volume index is low at 27.72 and the dimensionless index is 0.44 more  consistent with moderate AS. This likely represents low flow low gradient  moderate aortic stenosis. . The  aortic valve has an indeterminant number of cusps. Aortic valve  regurgitation is not visualized. Aortic valve area, by VTI measures 1.37  cm. Aortic valve mean gradient measures 11.2 mmHg.  6. The inferior vena cava is normal in size with greater than 50%  respiratory variability, suggesting right atrial pressure of 3 mmHg.    Neuro/Psych  Neuromuscular disease    GI/Hepatic negative GI ROS, Neg liver ROS,   Endo/Other  Obesity   Renal/GU negative Renal ROS   H/o Bladder cancer    Musculoskeletal  (+) Arthritis ,    Abdominal   Peds  Hematology negative hematology ROS (+)   Anesthesia Other Findings Day of surgery medications reviewed with the patient.  Reproductive/Obstetrics                            Anesthesia Physical Anesthesia Plan  ASA: IV  Anesthesia Plan: General   Post-op Pain Management:    Induction: Intravenous  PONV Risk Score and Plan: 3 and Treatment may vary due to age or medical condition  Airway Management Planned: Oral ETT  Additional Equipment: Arterial line, CVP, TEE and Ultrasound Guidance Line Placement  Intra-op Plan:   Post-operative Plan: Post-operative intubation/ventilation  Informed Consent: I have reviewed the patients History and Physical, chart, labs and discussed the procedure including the risks, benefits and alternatives for the proposed anesthesia with the patient or authorized representative who has indicated his/her understanding and acceptance.     Dental advisory given  Plan Discussed with: CRNA  Anesthesia Plan Comments:        Anesthesia Quick Evaluation

## 2019-04-25 ENCOUNTER — Inpatient Hospital Stay (HOSPITAL_COMMUNITY): Payer: Medicare Other | Admitting: Anesthesiology

## 2019-04-25 ENCOUNTER — Inpatient Hospital Stay (HOSPITAL_COMMUNITY): Payer: Medicare Other

## 2019-04-25 ENCOUNTER — Encounter (HOSPITAL_COMMUNITY)
Admission: EM | Disposition: A | Discharge status: Home / Self Care | Payer: Self-pay | Source: Home / Self Care | Attending: Cardiology

## 2019-04-25 ENCOUNTER — Encounter (HOSPITAL_COMMUNITY): Payer: Self-pay | Admitting: Cardiology

## 2019-04-25 DIAGNOSIS — I214 Non-ST elevation (NSTEMI) myocardial infarction: Secondary | ICD-10-CM

## 2019-04-25 DIAGNOSIS — I480 Paroxysmal atrial fibrillation: Secondary | ICD-10-CM

## 2019-04-25 HISTORY — PX: CORONARY ARTERY BYPASS GRAFT: SHX141

## 2019-04-25 HISTORY — PX: CLIPPING OF ATRIAL APPENDAGE: SHX5773

## 2019-04-25 LAB — CBC
HCT: 36.5 % (ref 36.0–46.0)
HCT: 26.4 % — ABNORMAL LOW (ref 36.0–46.0)
HCT: 27.6 % — ABNORMAL LOW (ref 36.0–46.0)
Hemoglobin: 11.8 g/dL — ABNORMAL LOW (ref 12.0–15.0)
Hemoglobin: 8.4 g/dL — ABNORMAL LOW (ref 12.0–15.0)
Hemoglobin: 9.1 g/dL — ABNORMAL LOW (ref 12.0–15.0)
MCH: 31.4 pg (ref 26.0–34.0)
MCH: 31.9 pg (ref 26.0–34.0)
MCH: 32.4 pg (ref 26.0–34.0)
MCHC: 32.3 g/dL (ref 30.0–36.0)
MCHC: 31.8 g/dL (ref 30.0–36.0)
MCHC: 33 g/dL (ref 30.0–36.0)
MCV: 97.1 fL (ref 80.0–100.0)
MCV: 100.4 fL — ABNORMAL HIGH (ref 80.0–100.0)
MCV: 98.2 fL (ref 80.0–100.0)
Platelets: 264 10*3/uL (ref 150–400)
Platelets: 144 10*3/uL — ABNORMAL LOW (ref 150–400)
Platelets: 161 10*3/uL (ref 150–400)
RBC: 3.76 MIL/uL — ABNORMAL LOW (ref 3.87–5.11)
RBC: 2.63 MIL/uL — ABNORMAL LOW (ref 3.87–5.11)
RBC: 2.81 MIL/uL — ABNORMAL LOW (ref 3.87–5.11)
RDW: 13.2 % (ref 11.5–15.5)
RDW: 13.2 % (ref 11.5–15.5)
RDW: 13.1 % (ref 11.5–15.5)
WBC: 7.4 10*3/uL (ref 4.0–10.5)
WBC: 9.8 10*3/uL (ref 4.0–10.5)
WBC: 9.6 10*3/uL (ref 4.0–10.5)
nRBC: 0 % (ref 0.0–0.2)
nRBC: 0 % (ref 0.0–0.2)
nRBC: 0 % (ref 0.0–0.2)

## 2019-04-25 LAB — POCT I-STAT 7, (LYTES, BLD GAS, ICA,H+H)
Acid-Base Excess: 1 mmol/L (ref 0.0–2.0)
Acid-base deficit: 5 mmol/L — ABNORMAL HIGH (ref 0.0–2.0)
Acid-base deficit: 6 mmol/L — ABNORMAL HIGH (ref 0.0–2.0)
Acid-base deficit: 3 mmol/L — ABNORMAL HIGH (ref 0.0–2.0)
Bicarbonate: 25.5 mmol/L (ref 20.0–28.0)
Bicarbonate: 20.9 mmol/L (ref 20.0–28.0)
Bicarbonate: 19.3 mmol/L — ABNORMAL LOW (ref 20.0–28.0)
Bicarbonate: 22.6 mmol/L (ref 20.0–28.0)
Calcium, Ion: 0.93 mmol/L — ABNORMAL LOW (ref 1.15–1.40)
Calcium, Ion: 1.26 mmol/L (ref 1.15–1.40)
Calcium, Ion: 1.1 mmol/L — ABNORMAL LOW (ref 1.15–1.40)
Calcium, Ion: 1.19 mmol/L (ref 1.15–1.40)
HCT: 22 % — ABNORMAL LOW (ref 36.0–46.0)
HCT: 26 % — ABNORMAL LOW (ref 36.0–46.0)
HCT: 24 % — ABNORMAL LOW (ref 36.0–46.0)
HCT: 26 % — ABNORMAL LOW (ref 36.0–46.0)
Hemoglobin: 7.5 g/dL — ABNORMAL LOW (ref 12.0–15.0)
Hemoglobin: 8.8 g/dL — ABNORMAL LOW (ref 12.0–15.0)
Hemoglobin: 8.2 g/dL — ABNORMAL LOW (ref 12.0–15.0)
Hemoglobin: 8.8 g/dL — ABNORMAL LOW (ref 12.0–15.0)
O2 Saturation: 100 %
O2 Saturation: 96 %
O2 Saturation: 99 %
O2 Saturation: 98 %
Patient temperature: 97.7
Patient temperature: 97.6
Patient temperature: 97.5
Potassium: 4.7 mmol/L (ref 3.5–5.1)
Potassium: 4 mmol/L (ref 3.5–5.1)
Potassium: 4.2 mmol/L (ref 3.5–5.1)
Potassium: 4.2 mmol/L (ref 3.5–5.1)
Sodium: 137 mmol/L (ref 135–145)
Sodium: 138 mmol/L (ref 135–145)
Sodium: 141 mmol/L (ref 135–145)
Sodium: 138 mmol/L (ref 135–145)
TCO2: 27 mmol/L (ref 22–32)
TCO2: 22 mmol/L (ref 22–32)
TCO2: 20 mmol/L — ABNORMAL LOW (ref 22–32)
TCO2: 24 mmol/L (ref 22–32)
pCO2 arterial: 38.1 mmHg (ref 32.0–48.0)
pCO2 arterial: 41.8 mmHg (ref 32.0–48.0)
pCO2 arterial: 35.2 mmHg (ref 32.0–48.0)
pCO2 arterial: 40.8 mmHg (ref 32.0–48.0)
pH, Arterial: 7.433 (ref 7.350–7.450)
pH, Arterial: 7.303 — ABNORMAL LOW (ref 7.350–7.450)
pH, Arterial: 7.344 — ABNORMAL LOW (ref 7.350–7.450)
pH, Arterial: 7.349 — ABNORMAL LOW (ref 7.350–7.450)
pO2, Arterial: 390 mmHg — ABNORMAL HIGH (ref 83.0–108.0)
pO2, Arterial: 86 mmHg (ref 83.0–108.0)
pO2, Arterial: 158 mmHg — ABNORMAL HIGH (ref 83.0–108.0)
pO2, Arterial: 103 mmHg (ref 83.0–108.0)

## 2019-04-25 LAB — POCT I-STAT, CHEM 8
BUN: 20 mg/dL (ref 8–23)
BUN: 17 mg/dL (ref 8–23)
BUN: 16 mg/dL (ref 8–23)
BUN: 17 mg/dL (ref 8–23)
BUN: 15 mg/dL (ref 8–23)
Calcium, Ion: 1.25 mmol/L (ref 1.15–1.40)
Calcium, Ion: 1.19 mmol/L (ref 1.15–1.40)
Calcium, Ion: 1.03 mmol/L — ABNORMAL LOW (ref 1.15–1.40)
Calcium, Ion: 1.04 mmol/L — ABNORMAL LOW (ref 1.15–1.40)
Calcium, Ion: 1.31 mmol/L (ref 1.15–1.40)
Chloride: 103 mmol/L (ref 98–111)
Chloride: 104 mmol/L (ref 98–111)
Chloride: 101 mmol/L (ref 98–111)
Chloride: 103 mmol/L (ref 98–111)
Chloride: 104 mmol/L (ref 98–111)
Creatinine, Ser: 0.8 mg/dL (ref 0.44–1.00)
Creatinine, Ser: 0.8 mg/dL (ref 0.44–1.00)
Creatinine, Ser: 0.7 mg/dL (ref 0.44–1.00)
Creatinine, Ser: 0.7 mg/dL (ref 0.44–1.00)
Creatinine, Ser: 0.8 mg/dL (ref 0.44–1.00)
Glucose, Bld: 101 mg/dL — ABNORMAL HIGH (ref 70–99)
Glucose, Bld: 102 mg/dL — ABNORMAL HIGH (ref 70–99)
Glucose, Bld: 123 mg/dL — ABNORMAL HIGH (ref 70–99)
Glucose, Bld: 147 mg/dL — ABNORMAL HIGH (ref 70–99)
Glucose, Bld: 143 mg/dL — ABNORMAL HIGH (ref 70–99)
HCT: 33 % — ABNORMAL LOW (ref 36.0–46.0)
HCT: 28 % — ABNORMAL LOW (ref 36.0–46.0)
HCT: 22 % — ABNORMAL LOW (ref 36.0–46.0)
HCT: 24 % — ABNORMAL LOW (ref 36.0–46.0)
HCT: 22 % — ABNORMAL LOW (ref 36.0–46.0)
Hemoglobin: 11.2 g/dL — ABNORMAL LOW (ref 12.0–15.0)
Hemoglobin: 9.5 g/dL — ABNORMAL LOW (ref 12.0–15.0)
Hemoglobin: 7.5 g/dL — ABNORMAL LOW (ref 12.0–15.0)
Hemoglobin: 8.2 g/dL — ABNORMAL LOW (ref 12.0–15.0)
Hemoglobin: 7.5 g/dL — ABNORMAL LOW (ref 12.0–15.0)
Potassium: 4 mmol/L (ref 3.5–5.1)
Potassium: 4.2 mmol/L (ref 3.5–5.1)
Potassium: 5.1 mmol/L (ref 3.5–5.1)
Potassium: 5.5 mmol/L — ABNORMAL HIGH (ref 3.5–5.1)
Potassium: 4.5 mmol/L (ref 3.5–5.1)
Sodium: 137 mmol/L (ref 135–145)
Sodium: 136 mmol/L (ref 135–145)
Sodium: 132 mmol/L — ABNORMAL LOW (ref 135–145)
Sodium: 135 mmol/L (ref 135–145)
Sodium: 135 mmol/L (ref 135–145)
TCO2: 26 mmol/L (ref 22–32)
TCO2: 24 mmol/L (ref 22–32)
TCO2: 25 mmol/L (ref 22–32)
TCO2: 27 mmol/L (ref 22–32)
TCO2: 24 mmol/L (ref 22–32)

## 2019-04-25 LAB — GLUCOSE, CAPILLARY
Glucose-Capillary: 127 mg/dL — ABNORMAL HIGH (ref 70–99)
Glucose-Capillary: 130 mg/dL — ABNORMAL HIGH (ref 70–99)
Glucose-Capillary: 109 mg/dL — ABNORMAL HIGH (ref 70–99)
Glucose-Capillary: 127 mg/dL — ABNORMAL HIGH (ref 70–99)
Glucose-Capillary: 141 mg/dL — ABNORMAL HIGH (ref 70–99)
Glucose-Capillary: 142 mg/dL — ABNORMAL HIGH (ref 70–99)

## 2019-04-25 LAB — APTT: aPTT: 35 seconds (ref 24–36)

## 2019-04-25 LAB — BASIC METABOLIC PANEL
Anion gap: 13 (ref 5–15)
Anion gap: 8 (ref 5–15)
BUN: 20 mg/dL (ref 8–23)
BUN: 13 mg/dL (ref 8–23)
CO2: 23 mmol/L (ref 22–32)
CO2: 20 mmol/L — ABNORMAL LOW (ref 22–32)
Calcium: 9.3 mg/dL (ref 8.9–10.3)
Calcium: 8 mg/dL — ABNORMAL LOW (ref 8.9–10.3)
Chloride: 101 mmol/L (ref 98–111)
Chloride: 107 mmol/L (ref 98–111)
Creatinine, Ser: 1.09 mg/dL — ABNORMAL HIGH (ref 0.44–1.00)
Creatinine, Ser: 0.82 mg/dL (ref 0.44–1.00)
GFR calc Af Amer: 56 mL/min — ABNORMAL LOW (ref >60)
GFR calc Af Amer: >60 mL/min (ref >60)
GFR calc non Af Amer: 48 mL/min — ABNORMAL LOW (ref >60)
GFR calc non Af Amer: >60 mL/min (ref >60)
Glucose, Bld: 96 mg/dL (ref 70–99)
Glucose, Bld: 145 mg/dL — ABNORMAL HIGH (ref 70–99)
Potassium: 4 mmol/L (ref 3.5–5.1)
Potassium: 4.1 mmol/L (ref 3.5–5.1)
Sodium: 137 mmol/L (ref 135–145)
Sodium: 135 mmol/L (ref 135–145)

## 2019-04-25 LAB — MAGNESIUM: Magnesium: 2.9 mg/dL — ABNORMAL HIGH (ref 1.7–2.4)

## 2019-04-25 LAB — PROTIME-INR
INR: 1.5 — ABNORMAL HIGH (ref 0.8–1.2)
Prothrombin Time: 18.1 seconds — ABNORMAL HIGH (ref 11.4–15.2)

## 2019-04-25 LAB — PLATELET COUNT: Platelets: 204 10*3/uL (ref 150–400)

## 2019-04-25 LAB — HEMOGLOBIN AND HEMATOCRIT, BLOOD
HCT: 22.8 % — ABNORMAL LOW (ref 36.0–46.0)
Hemoglobin: 7.5 g/dL — ABNORMAL LOW (ref 12.0–15.0)

## 2019-04-25 LAB — HEPARIN LEVEL (UNFRACTIONATED): Heparin Unfractionated: 0.65 IU/mL (ref 0.30–0.70)

## 2019-04-25 LAB — PREPARE RBC (CROSSMATCH)

## 2019-04-25 SURGERY — CORONARY ARTERY BYPASS GRAFTING (CABG)
Anesthesia: General | Site: Chest

## 2019-04-25 MED ORDER — ASPIRIN EC 81 MG PO TBEC
81.0000 mg | DELAYED_RELEASE_TABLET | Freq: Every day | ORAL | Status: DC
  Administered 2019-04-26 – 2019-05-02 (×7): 81 mg via ORAL
  Filled 2019-04-25 (×7): qty 1

## 2019-04-25 MED ORDER — CHLORHEXIDINE GLUCONATE 0.12 % MT SOLN
15.0000 mL | OROMUCOSAL | Status: AC
  Administered 2019-04-25: 15 mL via OROMUCOSAL

## 2019-04-25 MED ORDER — FENTANYL CITRATE (PF) 250 MCG/5ML IJ SOLN
INTRAMUSCULAR | Status: DC | PRN
  Administered 2019-04-25: 150 ug via INTRAVENOUS
  Administered 2019-04-25: 25 ug via INTRAVENOUS
  Administered 2019-04-25: 150 ug via INTRAVENOUS
  Administered 2019-04-25: 25 ug via INTRAVENOUS
  Administered 2019-04-25: 150 ug via INTRAVENOUS
  Administered 2019-04-25: 200 ug via INTRAVENOUS
  Administered 2019-04-25: 150 ug via INTRAVENOUS
  Administered 2019-04-25 (×3): 100 ug via INTRAVENOUS

## 2019-04-25 MED ORDER — ROCURONIUM BROMIDE 10 MG/ML (PF) SYRINGE
PREFILLED_SYRINGE | INTRAVENOUS | Status: DC | PRN
  Administered 2019-04-25: 50 mg via INTRAVENOUS
  Administered 2019-04-25: 100 mg via INTRAVENOUS
  Administered 2019-04-25 (×3): 50 mg via INTRAVENOUS

## 2019-04-25 MED ORDER — METOPROLOL TARTRATE 12.5 MG HALF TABLET
12.5000 mg | ORAL_TABLET | Freq: Two times a day (BID) | ORAL | Status: DC
  Administered 2019-04-26 – 2019-04-27 (×3): 12.5 mg via ORAL
  Filled 2019-04-25 (×3): qty 1

## 2019-04-25 MED ORDER — INSULIN REGULAR(HUMAN) IN NACL 100-0.9 UT/100ML-% IV SOLN: INTRAVENOUS | Status: DC

## 2019-04-25 MED ORDER — HEPARIN SODIUM (PORCINE) 1000 UNIT/ML IJ SOLN
INTRAMUSCULAR | Status: AC
  Filled 2019-04-25: qty 1

## 2019-04-25 MED ORDER — POTASSIUM CHLORIDE 10 MEQ/50ML IV SOLN: 10.0000 meq | INTRAVENOUS | Status: AC

## 2019-04-25 MED ORDER — ROCURONIUM BROMIDE 10 MG/ML (PF) SYRINGE
PREFILLED_SYRINGE | INTRAVENOUS | Status: AC
  Filled 2019-04-25: qty 10

## 2019-04-25 MED ORDER — ONDANSETRON HCL 4 MG/2ML IJ SOLN
4.0000 mg | Freq: Four times a day (QID) | INTRAMUSCULAR | Status: DC | PRN
  Administered 2019-04-25 – 2019-04-29 (×7): 4 mg via INTRAVENOUS
  Filled 2019-04-25 (×7): qty 2

## 2019-04-25 MED ORDER — OXYCODONE HCL 5 MG PO TABS: 5.0000 mg | ORAL_TABLET | ORAL | Status: DC | PRN

## 2019-04-25 MED ORDER — SODIUM CHLORIDE 0.9% FLUSH
3.0000 mL | Freq: Two times a day (BID) | INTRAVENOUS | Status: DC
  Administered 2019-04-26 – 2019-05-01 (×8): 3 mL via INTRAVENOUS

## 2019-04-25 MED ORDER — HEPARIN SODIUM (PORCINE) 1000 UNIT/ML IJ SOLN
INTRAMUSCULAR | Status: DC | PRN
  Administered 2019-04-25: 5000 [IU] via INTRAVENOUS
  Administered 2019-04-25: 26000 [IU] via INTRAVENOUS

## 2019-04-25 MED ORDER — MIDAZOLAM HCL 5 MG/5ML IJ SOLN
INTRAMUSCULAR | Status: DC | PRN
  Administered 2019-04-25: 1 mg via INTRAVENOUS
  Administered 2019-04-25: 3 mg via INTRAVENOUS
  Administered 2019-04-25: 1 mg via INTRAVENOUS
  Administered 2019-04-25: 2 mg via INTRAVENOUS
  Administered 2019-04-25: 1 mg via INTRAVENOUS

## 2019-04-25 MED ORDER — ARTIFICIAL TEARS OPHTHALMIC OINT
TOPICAL_OINTMENT | OPHTHALMIC | Status: AC
  Filled 2019-04-25: qty 3.5

## 2019-04-25 MED ORDER — PLASMA-LYTE 148 IV SOLN
INTRAVENOUS | Status: DC | PRN
  Administered 2019-04-25: 500 mL

## 2019-04-25 MED ORDER — SODIUM CHLORIDE 0.9% FLUSH: 3.0000 mL | INTRAVENOUS | Status: DC | PRN

## 2019-04-25 MED ORDER — 0.9 % SODIUM CHLORIDE (POUR BTL) OPTIME
TOPICAL | Status: DC | PRN
  Administered 2019-04-25: 5000 mL

## 2019-04-25 MED ORDER — ALBUMIN HUMAN 5 % IV SOLN
250.0000 mL | INTRAVENOUS | Status: AC | PRN
  Administered 2019-04-25 – 2019-04-26 (×4): 12.5 g via INTRAVENOUS
  Filled 2019-04-25 (×2): qty 250

## 2019-04-25 MED ORDER — LACTATED RINGERS IV SOLN: INTRAVENOUS | Status: DC

## 2019-04-25 MED ORDER — CHLORHEXIDINE GLUCONATE CLOTH 2 % EX PADS
6.0000 | MEDICATED_PAD | Freq: Every day | CUTANEOUS | Status: DC
  Administered 2019-04-25 – 2019-05-01 (×7): 6 via TOPICAL

## 2019-04-25 MED ORDER — NOREPINEPHRINE 4 MG/250ML-% IV SOLN: 0.0000 ug/min | INTRAVENOUS | Status: DC

## 2019-04-25 MED ORDER — BISACODYL 5 MG PO TBEC
10.0000 mg | DELAYED_RELEASE_TABLET | Freq: Every day | ORAL | Status: DC
  Administered 2019-04-26 – 2019-04-28 (×3): 10 mg via ORAL
  Filled 2019-04-25 (×5): qty 2

## 2019-04-25 MED ORDER — DEXTROSE 50 % IV SOLN: 0.0000 mL | INTRAVENOUS | Status: DC | PRN

## 2019-04-25 MED ORDER — ACETAMINOPHEN 650 MG RE SUPP
650.0000 mg | Freq: Once | RECTAL | Status: AC
  Administered 2019-04-25: 650 mg via RECTAL

## 2019-04-25 MED ORDER — DOCUSATE SODIUM 100 MG PO CAPS
200.0000 mg | ORAL_CAPSULE | Freq: Every day | ORAL | Status: DC
  Administered 2019-04-26 – 2019-04-29 (×4): 200 mg via ORAL
  Filled 2019-04-25 (×5): qty 2

## 2019-04-25 MED ORDER — SODIUM CHLORIDE 0.9 % IV SOLN: INTRAVENOUS | Status: DC | PRN

## 2019-04-25 MED ORDER — LACTATED RINGERS IV SOLN: INTRAVENOUS | Status: DC | PRN

## 2019-04-25 MED ORDER — LIDOCAINE 2% (20 MG/ML) 5 ML SYRINGE
INTRAMUSCULAR | Status: AC
  Filled 2019-04-25: qty 5

## 2019-04-25 MED ORDER — ARTIFICIAL TEARS OPHTHALMIC OINT
TOPICAL_OINTMENT | OPHTHALMIC | Status: DC | PRN
  Administered 2019-04-25: 1 via OPHTHALMIC

## 2019-04-25 MED ORDER — SUCCINYLCHOLINE CHLORIDE 200 MG/10ML IV SOSY
PREFILLED_SYRINGE | INTRAVENOUS | Status: AC
  Filled 2019-04-25: qty 10

## 2019-04-25 MED ORDER — BISACODYL 10 MG RE SUPP: 10.0000 mg | Freq: Every day | RECTAL | Status: DC

## 2019-04-25 MED ORDER — INSULIN ASPART 100 UNIT/ML ~~LOC~~ SOLN
0.0000 [IU] | SUBCUTANEOUS | Status: DC
  Administered 2019-04-25 – 2019-04-26 (×5): 2 [IU] via SUBCUTANEOUS

## 2019-04-25 MED ORDER — ACETAMINOPHEN 160 MG/5ML PO SOLN
1000.0000 mg | Freq: Four times a day (QID) | ORAL | Status: AC
  Administered 2019-04-29: 1000 mg
  Filled 2019-04-25: qty 40.6

## 2019-04-25 MED ORDER — PROPOFOL 10 MG/ML IV BOLUS
INTRAVENOUS | Status: DC | PRN
  Administered 2019-04-25: 80 mg via INTRAVENOUS

## 2019-04-25 MED ORDER — ACETAMINOPHEN 160 MG/5ML PO SOLN: 650.0000 mg | Freq: Once | ORAL | Status: AC

## 2019-04-25 MED ORDER — PHENYLEPHRINE 40 MCG/ML (10ML) SYRINGE FOR IV PUSH (FOR BLOOD PRESSURE SUPPORT)
PREFILLED_SYRINGE | INTRAVENOUS | Status: AC
  Filled 2019-04-25: qty 10

## 2019-04-25 MED ORDER — PANTOPRAZOLE SODIUM 40 MG PO TBEC
40.0000 mg | DELAYED_RELEASE_TABLET | Freq: Every day | ORAL | Status: DC
  Administered 2019-04-27 – 2019-05-02 (×6): 40 mg via ORAL
  Filled 2019-04-25 (×6): qty 1

## 2019-04-25 MED ORDER — SODIUM CHLORIDE (PF) 0.9 % IJ SOLN
OROMUCOSAL | Status: DC | PRN
  Administered 2019-04-25 (×3): 4 mL via TOPICAL

## 2019-04-25 MED ORDER — DIPHENHYDRAMINE HCL 50 MG/ML IJ SOLN
INTRAMUSCULAR | Status: AC
  Filled 2019-04-25: qty 1

## 2019-04-25 MED ORDER — FAMOTIDINE IN NACL 20-0.9 MG/50ML-% IV SOLN
20.0000 mg | Freq: Two times a day (BID) | INTRAVENOUS | Status: AC
  Administered 2019-04-25 (×2): 20 mg via INTRAVENOUS
  Filled 2019-04-25: qty 50

## 2019-04-25 MED ORDER — MIDAZOLAM HCL 2 MG/2ML IJ SOLN: 2.0000 mg | INTRAMUSCULAR | Status: DC | PRN

## 2019-04-25 MED ORDER — PHENYLEPHRINE HCL-NACL 10-0.9 MG/250ML-% IV SOLN
INTRAVENOUS | Status: DC | PRN
  Administered 2019-04-25: 20 ug/min via INTRAVENOUS

## 2019-04-25 MED ORDER — NITROGLYCERIN IN D5W 200-5 MCG/ML-% IV SOLN
0.0000 ug/min | INTRAVENOUS | Status: DC
  Administered 2019-04-25: 33 ug/min via INTRAVENOUS

## 2019-04-25 MED ORDER — MORPHINE SULFATE (PF) 2 MG/ML IV SOLN
1.0000 mg | INTRAVENOUS | Status: DC | PRN
  Administered 2019-04-25: 1 mg via INTRAVENOUS
  Administered 2019-04-26 (×2): 2 mg via INTRAVENOUS
  Filled 2019-04-25 (×3): qty 1

## 2019-04-25 MED ORDER — PROTAMINE SULFATE 10 MG/ML IV SOLN
INTRAVENOUS | Status: DC | PRN
  Administered 2019-04-25: 40 mg via INTRAVENOUS
  Administered 2019-04-25: 30 mg via INTRAVENOUS
  Administered 2019-04-25: 40 mg via INTRAVENOUS
  Administered 2019-04-25 (×5): 30 mg via INTRAVENOUS

## 2019-04-25 MED ORDER — TRAMADOL HCL 50 MG PO TABS
50.0000 mg | ORAL_TABLET | ORAL | Status: DC | PRN
  Administered 2019-04-25 (×2): 50 mg via ORAL
  Administered 2019-04-26 (×3): 100 mg via ORAL
  Administered 2019-04-26 – 2019-04-27 (×2): 50 mg via ORAL
  Administered 2019-04-28: 100 mg via ORAL
  Filled 2019-04-25: qty 1
  Filled 2019-04-25: qty 2
  Filled 2019-04-25 (×3): qty 1
  Filled 2019-04-25 (×4): qty 2

## 2019-04-25 MED ORDER — ACETAMINOPHEN 500 MG PO TABS
1000.0000 mg | ORAL_TABLET | Freq: Four times a day (QID) | ORAL | Status: AC
  Administered 2019-04-26 – 2019-04-30 (×16): 1000 mg via ORAL
  Filled 2019-04-25 (×19): qty 2

## 2019-04-25 MED ORDER — HEMOSTATIC AGENTS (NO CHARGE) OPTIME
TOPICAL | Status: DC | PRN
  Administered 2019-04-25: 1 via TOPICAL

## 2019-04-25 MED ORDER — PROTAMINE SULFATE 10 MG/ML IV SOLN
INTRAVENOUS | Status: AC
  Filled 2019-04-25: qty 25

## 2019-04-25 MED ORDER — METOPROLOL TARTRATE 25 MG/10 ML ORAL SUSPENSION: 12.5000 mg | Freq: Two times a day (BID) | ORAL | Status: DC

## 2019-04-25 MED ORDER — DIPHENHYDRAMINE HCL 50 MG/ML IJ SOLN
INTRAMUSCULAR | Status: DC | PRN
  Administered 2019-04-25: 25 mg via INTRAVENOUS

## 2019-04-25 MED ORDER — MIDAZOLAM HCL (PF) 10 MG/2ML IJ SOLN
INTRAMUSCULAR | Status: AC
  Filled 2019-04-25: qty 2

## 2019-04-25 MED ORDER — LIDOCAINE 2% (20 MG/ML) 5 ML SYRINGE
INTRAMUSCULAR | Status: DC | PRN
  Administered 2019-04-25: 100 mg via INTRAVENOUS

## 2019-04-25 MED ORDER — FENTANYL CITRATE (PF) 250 MCG/5ML IJ SOLN
INTRAMUSCULAR | Status: AC
  Filled 2019-04-25: qty 25

## 2019-04-25 MED ORDER — ALBUMIN HUMAN 5 % IV SOLN: INTRAVENOUS | Status: DC | PRN

## 2019-04-25 MED ORDER — SODIUM CHLORIDE 0.9 % IV SOLN: INTRAVENOUS | Status: DC

## 2019-04-25 MED ORDER — VANCOMYCIN HCL IN DEXTROSE 1-5 GM/200ML-% IV SOLN
1000.0000 mg | Freq: Once | INTRAVENOUS | Status: AC
  Administered 2019-04-25: 1000 mg via INTRAVENOUS
  Filled 2019-04-25: qty 200

## 2019-04-25 MED ORDER — SODIUM CHLORIDE 0.9 % IV SOLN
1.5000 g | Freq: Two times a day (BID) | INTRAVENOUS | Status: AC
  Administered 2019-04-25 – 2019-04-27 (×4): 1.5 g via INTRAVENOUS
  Filled 2019-04-25 (×4): qty 1.5

## 2019-04-25 MED ORDER — SODIUM CHLORIDE 0.9 % IV SOLN: 250.0000 mL | INTRAVENOUS | Status: DC

## 2019-04-25 MED ORDER — DEXMEDETOMIDINE HCL IN NACL 400 MCG/100ML IV SOLN: 0.0000 ug/kg/h | INTRAVENOUS | Status: DC

## 2019-04-25 MED ORDER — METOPROLOL TARTRATE 5 MG/5ML IV SOLN
2.5000 mg | INTRAVENOUS | Status: DC | PRN
  Administered 2019-04-28: 12:00:00 5 mg via INTRAVENOUS
  Administered 2019-04-29: 2.5 mg via INTRAVENOUS
  Filled 2019-04-25 (×2): qty 5

## 2019-04-25 MED ORDER — SODIUM CHLORIDE 0.45 % IV SOLN: INTRAVENOUS | Status: DC | PRN

## 2019-04-25 MED ORDER — LACTATED RINGERS IV SOLN: 500.0000 mL | Freq: Once | INTRAVENOUS | Status: DC | PRN

## 2019-04-25 MED ORDER — MAGNESIUM SULFATE 4 GM/100ML IV SOLN
4.0000 g | Freq: Once | INTRAVENOUS | Status: AC
  Administered 2019-04-25: 4 g via INTRAVENOUS
  Filled 2019-04-25: qty 100

## 2019-04-25 MED ORDER — PROPOFOL 10 MG/ML IV BOLUS
INTRAVENOUS | Status: AC
  Filled 2019-04-25: qty 20

## 2019-04-25 MED ORDER — PHENYLEPHRINE 40 MCG/ML (10ML) SYRINGE FOR IV PUSH (FOR BLOOD PRESSURE SUPPORT)
PREFILLED_SYRINGE | INTRAVENOUS | Status: DC | PRN
  Administered 2019-04-25: 120 ug via INTRAVENOUS

## 2019-04-25 MED ORDER — EPHEDRINE 5 MG/ML INJ
INTRAVENOUS | Status: AC
  Filled 2019-04-25: qty 10

## 2019-04-25 MED ORDER — PHENYLEPHRINE HCL-NACL 20-0.9 MG/250ML-% IV SOLN
0.0000 ug/min | INTRAVENOUS | Status: DC
  Administered 2019-04-26: 20 ug/min via INTRAVENOUS
  Filled 2019-04-25 (×2): qty 250

## 2019-04-25 SURGICAL SUPPLY — 119 items
ADAPTER MULTI PERFUSION 15 (ADAPTER) ×3 IMPLANT
ADH SKN CLS APL DERMABOND .7 (GAUZE/BANDAGES/DRESSINGS) ×2
APL SWBSTK 6 STRL LF DISP (MISCELLANEOUS) ×1
APPLICATOR COTTON TIP 6 STRL (MISCELLANEOUS) ×1 IMPLANT
APPLICATOR COTTON TIP 6IN STRL (MISCELLANEOUS) ×3
ARTICLIP LAA PROCLIP II 40 (Clip) ×2 IMPLANT
ARTICLIP LAA PROCLIP II 40MM (Clip) ×1 IMPLANT
BAG DECANTER FOR FLEXI CONT (MISCELLANEOUS) ×3 IMPLANT
BASKET HEART  (ORDER IN 25'S) (MISCELLANEOUS) ×1
BASKET HEART (ORDER IN 25'S) (MISCELLANEOUS) ×1
BASKET HEART (ORDER IN 25S) (MISCELLANEOUS) ×1 IMPLANT
BLADE CLIPPER SURG (BLADE) IMPLANT
BLADE STERNUM SYSTEM 6 (BLADE) ×3 IMPLANT
BLADE SURG 11 STRL SS (BLADE) ×3 IMPLANT
BNDG ELASTIC 4X5.8 VLCR STR LF (GAUZE/BANDAGES/DRESSINGS) ×6 IMPLANT
BNDG ELASTIC 6X5.8 VLCR STR LF (GAUZE/BANDAGES/DRESSINGS) ×6 IMPLANT
BNDG GAUZE ELAST 4 BULKY (GAUZE/BANDAGES/DRESSINGS) ×6 IMPLANT
CABLE SURGICAL S-101-97-12 (CABLE) ×3 IMPLANT
CANISTER SUCT 3000ML PPV (MISCELLANEOUS) ×3 IMPLANT
CANNULA AORTIC ROOT 9FR (CANNULA) ×3 IMPLANT
CANNULA EZ GLIDE 8.0 24FR (CANNULA) ×3 IMPLANT
CANNULA MC2 2 STG 29/37 NON-V (CANNULA) ×1 IMPLANT
CANNULA MC2 TWO STAGE (CANNULA) ×2
CANNULA VESSEL 3MM BLUNT TIP (CANNULA) ×6 IMPLANT
CATH FOLEY 16FR TEMP PROBE (CATHETERS) ×3 IMPLANT
CATH FOLEY LATEX FREE 16FR (CATHETERS) ×3
CATH FOLEY LF 16FR (CATHETERS) ×1 IMPLANT
CATH ROBINSON RED A/P 18FR (CATHETERS) IMPLANT
CLIP RETRACTION 3.0MM CORONARY (MISCELLANEOUS) ×3 IMPLANT
CLIP VESOCCLUDE MED 24/CT (CLIP) IMPLANT
CLIP VESOCCLUDE SM WIDE 24/CT (CLIP) IMPLANT
CONN ST 1/2X1/2  BEN (MISCELLANEOUS) ×2
CONN ST 1/2X1/2 BEN (MISCELLANEOUS) ×1 IMPLANT
CONNECTOR BLAKE 2:1 CARIO BLK (MISCELLANEOUS) ×3 IMPLANT
DERMABOND ADVANCED (GAUZE/BANDAGES/DRESSINGS) ×4
DERMABOND ADVANCED .7 DNX12 (GAUZE/BANDAGES/DRESSINGS) ×2 IMPLANT
DEVICE ATRICLIP LAA PRCLPII 40 (Clip) ×1 IMPLANT
DRAIN CHANNEL 19F RND (DRAIN) ×9 IMPLANT
DRAIN CONNECTOR BLAKE 1:1 (MISCELLANEOUS) ×3 IMPLANT
DRAIN HEMOVAC 1/8 X 5 (WOUND CARE) ×3 IMPLANT
DRAIN JACKSON PRATT 10MM FLAT (MISCELLANEOUS) ×3 IMPLANT
DRAIN JP 10F RND SILICONE (MISCELLANEOUS) ×3 IMPLANT
DRAPE CARDIOVASCULAR INCISE (DRAPES) ×3
DRAPE INCISE IOBAN 66X45 STRL (DRAPES) IMPLANT
DRAPE SLUSH/WARMER DISC (DRAPES) ×3 IMPLANT
DRAPE SRG 135X102X78XABS (DRAPES) ×1 IMPLANT
DRSG AQUACEL AG ADV 3.5X14 (GAUZE/BANDAGES/DRESSINGS) ×3 IMPLANT
DRSG COVADERM 4X14 (GAUZE/BANDAGES/DRESSINGS) IMPLANT
ELECT BLADE 4.0 EZ CLEAN MEGAD (MISCELLANEOUS) ×3
ELECT REM PT RETURN 9FT ADLT (ELECTROSURGICAL) ×6
ELECTRODE BLDE 4.0 EZ CLN MEGD (MISCELLANEOUS) ×1 IMPLANT
ELECTRODE REM PT RTRN 9FT ADLT (ELECTROSURGICAL) ×2 IMPLANT
EVACUATOR SILICONE 100CC (DRAIN) ×3 IMPLANT
FELT TEFLON 1X6 (MISCELLANEOUS) ×3 IMPLANT
GAUZE SPONGE 4X4 12PLY STRL (GAUZE/BANDAGES/DRESSINGS) ×9 IMPLANT
GLOVE BIO SURGEON STRL SZ7 (GLOVE) IMPLANT
GLOVE BIOGEL M STRL SZ7.5 (GLOVE) IMPLANT
GLOVE BIOGEL PI IND STRL 6.5 (GLOVE) ×1 IMPLANT
GLOVE BIOGEL PI IND STRL 7.0 (GLOVE) ×2 IMPLANT
GLOVE BIOGEL PI IND STRL 7.5 (GLOVE) ×1 IMPLANT
GLOVE BIOGEL PI IND STRL 8.5 (GLOVE) ×1 IMPLANT
GLOVE BIOGEL PI INDICATOR 6.5 (GLOVE) ×2
GLOVE BIOGEL PI INDICATOR 7.0 (GLOVE) ×4
GLOVE BIOGEL PI INDICATOR 7.5 (GLOVE) ×2
GLOVE BIOGEL PI INDICATOR 8.5 (GLOVE) ×2
GLOVE SURG SS PI 6.0 STRL IVOR (GLOVE) ×6 IMPLANT
GLOVE SURG SS PI 6.5 STRL IVOR (GLOVE) ×3 IMPLANT
GLOVE SURG SS PI 7.0 STRL IVOR (GLOVE) ×3 IMPLANT
GLOVE SURG SS PI 7.5 STRL IVOR (GLOVE) ×9 IMPLANT
GOWN STRL REUS W/ TWL LRG LVL3 (GOWN DISPOSABLE) ×8 IMPLANT
GOWN STRL REUS W/ TWL XL LVL3 (GOWN DISPOSABLE) ×3 IMPLANT
GOWN STRL REUS W/TWL LRG LVL3 (GOWN DISPOSABLE) ×24
GOWN STRL REUS W/TWL XL LVL3 (GOWN DISPOSABLE) ×9
HEMOSTAT POWDER SURGIFOAM 1G (HEMOSTASIS) ×9 IMPLANT
KIT BASIN OR (CUSTOM PROCEDURE TRAY) ×3 IMPLANT
KIT SUCTION CATH 14FR (SUCTIONS) ×9 IMPLANT
KIT TURNOVER KIT B (KITS) ×3 IMPLANT
KIT VASOVIEW HEMOPRO 2 VH 4000 (KITS) ×3 IMPLANT
LEAD PACING MYOCARDI (MISCELLANEOUS) ×3 IMPLANT
MARKER GRAFT CORONARY BYPASS (MISCELLANEOUS) ×9 IMPLANT
NS IRRIG 1000ML POUR BTL (IV SOLUTION) ×15 IMPLANT
PACK E OPEN HEART (SUTURE) ×3 IMPLANT
PACK OPEN HEART (CUSTOM PROCEDURE TRAY) ×3 IMPLANT
PAD ARMBOARD 7.5X6 YLW CONV (MISCELLANEOUS) ×6 IMPLANT
PAD ELECT DEFIB RADIOL ZOLL (MISCELLANEOUS) ×3 IMPLANT
PENCIL BUTTON HOLSTER BLD 10FT (ELECTRODE) ×3 IMPLANT
POSITIONER HEAD DONUT 9IN (MISCELLANEOUS) ×3 IMPLANT
PUNCH AORTIC ROTATE 4.0MM (MISCELLANEOUS) ×3 IMPLANT
SET CARDIOPLEGIA MPS 5001102 (MISCELLANEOUS) ×3 IMPLANT
SOL ANTI FOG 6CC (MISCELLANEOUS) ×1 IMPLANT
SOLUTION ANTI FOG 6CC (MISCELLANEOUS) ×2
SUT BONE WAX W31G (SUTURE) ×3 IMPLANT
SUT ETHIBOND X763 2 0 SH 1 (SUTURE) ×6 IMPLANT
SUT ETHILON 3 0 FSL (SUTURE) ×3 IMPLANT
SUT MNCRL AB 3-0 PS2 18 (SUTURE) ×6 IMPLANT
SUT MNCRL AB 4-0 PS2 18 (SUTURE) ×6 IMPLANT
SUT PDS AB 1 CTX 36 (SUTURE) ×6 IMPLANT
SUT PROLENE 4 0 RB 1 (SUTURE) ×9
SUT PROLENE 4 0 SH DA (SUTURE) ×3 IMPLANT
SUT PROLENE 4-0 RB1 .5 CRCL 36 (SUTURE) ×3 IMPLANT
SUT PROLENE 5 0 C 1 36 (SUTURE) ×9 IMPLANT
SUT PROLENE 7 0 BV 1 (SUTURE) ×3 IMPLANT
SUT PROLENE 7 0 BV1 MDA (SUTURE) ×6 IMPLANT
SUT STEEL 6MS V (SUTURE) ×3 IMPLANT
SUT STEEL SZ 6 DBL 3X14 BALL (SUTURE) ×6 IMPLANT
SUT VIC AB 2-0 CT1 27 (SUTURE) ×9
SUT VIC AB 2-0 CT1 TAPERPNT 27 (SUTURE) ×3 IMPLANT
SYSTEM SAHARA CHEST DRAIN ATS (WOUND CARE) ×3 IMPLANT
TAPE CLOTH SURG 4X10 WHT LF (GAUZE/BANDAGES/DRESSINGS) ×3 IMPLANT
TAPE PAPER 2X10 WHT MICROPORE (GAUZE/BANDAGES/DRESSINGS) ×3 IMPLANT
TOWEL GREEN STERILE (TOWEL DISPOSABLE) ×3 IMPLANT
TOWEL GREEN STERILE FF (TOWEL DISPOSABLE) IMPLANT
TRAY FOLEY SLVR 16FR TEMP STAT (SET/KITS/TRAYS/PACK) IMPLANT
TUBE SUCT INTRACARD DLP 20F (MISCELLANEOUS) ×3 IMPLANT
TUBE SUCTION CARDIAC 10FR (CANNULA) ×3 IMPLANT
TUBING LAP HI FLOW INSUFFLATIO (TUBING) ×3 IMPLANT
UNDERPAD 30X30 (UNDERPADS AND DIAPERS) ×3 IMPLANT
WATER STERILE IRR 1000ML POUR (IV SOLUTION) ×6 IMPLANT
YANKAUER SUCT BULB TIP NO VENT (SUCTIONS) ×3 IMPLANT

## 2019-04-25 NOTE — Procedures (Signed)
Extubation Procedure Note  Patient Details:   Name: SARAIAH BRAYE DOB: 01/08/1941 MRN: [id number]   Airway Documentation:    Vent end date: 04/25/19 Vent end time: 1900   Evaluation  O2 sats: stable throughout Complications: No apparent complications Patient did tolerate procedure well. Bilateral Breath Sounds: Clear   Yes   Patient performed a NIF of -22 and a VC of 750 ml's with good effort. Patient had a cuff leak and was extubated to a 4L [place] per rapid wean protocol. No stridor was noted. Patient appeared to tolerate well. RN at bedside with RT during extubation.  Tiburcio Bash 04/25/2019, 7:21 PM

## 2019-04-25 NOTE — Brief Op Note (Signed)
04/15/2019 - 04/25/2019  1:00 PM  PATIENT:  Gabriela Smith  79 y.o. female  PRE-OPERATIVE DIAGNOSIS:  CORONARY ARTERY DISEASE  POST-OPERATIVE DIAGNOSIS:  CORONARY ARTERY DISEASE  PROCEDURE:  Procedure(s):  CORONARY ARTERY BYPASS GRAFTING x 4 -LIMA to DIAGONAL -SVG to MID LAD -SVG to OM -SVG to PDA  ENDOSCOPIC HARVEST GREATER SAPHENOUS VEIN -Right Thigh, Left Leg  SURGEON:  Surgeon(s) and Role: Panel 1:    * Lightfoot, Lucile Crater, MD - Primary    * Grace Isaac, MD - Assisting  PHYSICIAN ASSISTANT: Ellwood Handler PA-C, Junita Push PA-S  ANESTHESIA:   general  EBL:  800 mL   BLOOD ADMINISTERED:CELLSAVER  DRAINS:  Left Pleural Chest Tube, Mediastinal Chest Drains    LOCAL MEDICATIONS USED:  NONE  SPECIMEN:  No Specimen  DISPOSITION OF SPECIMEN:  N/A  COUNTS:  YES  TOURNIQUET:  * No tourniquets in log *  DICTATION: .Dragon Dictation  PLAN OF CARE: Admit to inpatient   PATIENT DISPOSITION:  ICU - intubated and hemodynamically stable.   Delay start of Pharmacological VTE agent (>24hrs) due to surgical blood loss or risk of bleeding: yes

## 2019-04-25 NOTE — Anesthesia Postprocedure Evaluation (Signed)
Anesthesia Post Note  Patient: Gabriela Smith  Procedure(s) Performed: CORONARY ARTERY BYPASS GRAFTING (CABG) times four using left internal mammary artery and left greater saphenous vein harvested endoscipically. (N/A Chest) Clipping Of Atrial Appendage (N/A Chest)     Patient location during evaluation: SICU Anesthesia Type: General Level of consciousness: sedated Pain management: pain level controlled Vital Signs Assessment: post-procedure vital signs reviewed and stable Respiratory status: patient remains intubated per anesthesia plan Cardiovascular status: stable Postop Assessment: no apparent nausea or vomiting Anesthetic complications: no    Last Vitals:  Vitals:   04/25/19 1800 04/25/19 1900  BP:  (!) 132/55  Pulse: 64 68  Resp: 18 (!) 22  Temp: 36.4 C   SpO2: 100% 99%    Last Pain:  Vitals:   04/25/19 1800  TempSrc: Axillary  PainSc:                  Gabriela Smith

## 2019-04-25 NOTE — Anesthesia Procedure Notes (Signed)
Arterial Line Insertion Start/End2/15/2021 7:05 AM, 04/25/2019 7:10 AM Performed by: Harden Mo, CRNA, CRNA  Preanesthetic checklist: patient identified, IV checked, site marked, risks and benefits discussed, surgical consent, monitors and equipment checked, pre-op evaluation and anesthesia consent Lidocaine 1% used for infiltration and patient sedated Left, radial was placed Catheter size: 20 G Hand hygiene performed  and maximum sterile barriers used  Allen's test indicative of satisfactory collateral circulation Attempts: 1 Procedure performed without using ultrasound guided technique. Ultrasound Notes:anatomy identified, needle tip was noted to be adjacent to the nerve/plexus identified and no ultrasound evidence of intravascular and/or intraneural injection Following insertion, dressing applied and Biopatch. Post procedure assessment: normal  Patient tolerated the procedure well with no immediate complications.

## 2019-04-25 NOTE — Progress Notes (Signed)
  Echocardiogram Echocardiogram Transesophageal has been performed.  Gabriela Smith 04/25/2019, 8:37 AM

## 2019-04-25 NOTE — Progress Notes (Signed)
Report give to Anesthesia. Pt is being transported now to procedure.

## 2019-04-25 NOTE — Anesthesia Procedure Notes (Signed)
Central Venous Catheter Insertion Performed by: [name], MD, anesthesiologist Start/End2/15/2021 7:05 AM, 04/25/2019 7:15 AM Patient location: Pre-op. Preanesthetic checklist: patient identified, IV checked, site marked, risks and benefits discussed, surgical consent, monitors and equipment checked, pre-op evaluation, timeout performed and anesthesia consent Position: Trendelenburg Lidocaine 1% used for infiltration and patient sedated Hand hygiene performed , maximum sterile barriers used  and Seldinger technique used Catheter size: 9 Fr Central line was placed.MAC introducer Procedure performed using ultrasound guided technique. Ultrasound Notes:anatomy identified, needle tip was noted to be adjacent to the nerve/plexus identified, no ultrasound evidence of intravascular and/or intraneural injection and image(s) printed for medical record Attempts: 1 Following insertion, line sutured, dressing applied and Biopatch. Post procedure assessment: free fluid flow, blood return through all ports and no air  Patient tolerated the procedure well with no immediate complications.

## 2019-04-25 NOTE — Anesthesia Procedure Notes (Signed)
Procedure Name: Intubation Date/Time: 04/25/2019 8:01 AM Performed by: Harden Mo, CRNA Pre-anesthesia Checklist: Patient identified, Emergency Drugs available, Suction available and Patient being monitored Patient Re-evaluated:Patient Re-evaluated prior to induction Oxygen Delivery Method: Circle System Utilized Preoxygenation: Pre-oxygenation with 100% oxygen Induction Type: IV induction Ventilation: Mask ventilation without difficulty and Oral airway inserted - appropriate to patient size Laryngoscope Size: Sabra Heck and 2 Grade View: Grade I Tube type: Oral Tube size: 8.0 mm Number of attempts: 1 Airway Equipment and Method: Stylet and Oral airway Placement Confirmation: ETT inserted through vocal cords under direct vision,  positive ETCO2 and breath sounds checked- equal and bilateral Secured at: 22 cm Tube secured with: Tape Dental Injury: Teeth and Oropharynx as per pre-operative assessment

## 2019-04-25 NOTE — Progress Notes (Signed)
     [address]       [place] [address]             [phone]       No events  Vitals:   04/25/19 0026 04/25/19 0508  BP: (!) 149/72 (!) 150/67  Pulse: 68   Resp: 18 20  Temp: 98.2 F (36.8 C) 97.6 F (36.4 C)  SpO2: 99% 98%   Alert NAD Sinus brady EWOB  Labs reviewed  78 NSTEMI, afib, moderate AS  OR today for CABG 3, atriclip Risks and benefits discussed Will follow aortic valve clinically  Dor O Aristidis Talerico

## 2019-04-25 NOTE — Op Note (Signed)
[address]       [place] [address]             [phone]                                          04/25/2019 Patient:  Gabriela Smith Pre-Op Dx: NSTEMI   3V CAD   Paroxysmal Atrial Fibrillation     Moderate Aortic Stenosis   Congestive Heart Failure Post-op Dx:  same Procedure: CABG X 4.  LIMA D1, RSVG LAD, PDA, OM1   Atriclip placement 16mm Endoscopic greater saphenous vein harvest on bilateral thighs Intra-operative Transesophageal Echocardiogram  Surgeon and Role:      * Boysie Bonebrake, Lucile Crater, MD - Primary    * Dr. Servando Snare, MD - assisting Assistant: Leretha Pol, PA-C  Anesthesia  general EBL:  500 ml Blood Administration: none Xclamp Time:  62 min Pump Time:  130min  Drains: 68 F blake drain: L, mediastinal  Wires: none Counts: correct   Indications: 47 female with 3V CAD, LM equivalent, moderate aortic stenosis, and paroxysymal atrial fibrillation.  She presents with dyspnea, which is now improved with diuresis.  I discussed several options with her and her family, which included CABG/atriclip/AVR, vs CABG/atriclip followed by staged TAVR.  Due to her age, and functional status, I elected to follow the aortic valve clinically, with the option of performing a TAVR in the future.  Findings: Small LIMA.  Small LAD.  The diagonal was a much larger target so I placed the LIMA on the diagonal, and a vein graft on the LIMA.  Good flows on vein grafts.  Operative Technique: All invasive lines were placed in pre-op holding.  After the risks, benefits and alternatives were thoroughly discussed, the patient was brought to the operative theatre.  Anesthesia was induced, and the patient was prepped and draped in normal sterile fashion.  An appropriate surgical pause was performed, and pre-operative antibiotics were dosed accordingly.  We began with simultaneous incisions were made along the right leg for harvesting of the greater saphenous vein and the  chest for the sternotomy.  In regards to the sternotomy, this was carried down with bovie cautery, and the sternum was divided with a reciprocating saw.  Meticulous hemostasis was obtained.  The left internal thoracic artery was exposed and harvested in in pedicled fashion.  The patient was systemically heparinized, and the artery was divided distally, and placed in a papaverine sponge.    The sternal elevator was removed, and a retractor was placed.  The pericardium was divided in the midline and fashioned into a cradle with pericardial stitches.   After we confirmed an appropriate ACT, the ascending aorta was cannulated in standard fashion.  The right atrial appendage was used for venous cannulation site.  Cardiopulmonary bypass was initiated, and the heart retractor was placed. The cross clamp was applied, and a dose of anterograde cardioplegia was given with good arrest of the heart.  We moved to the posterior wall of the heart, and found a good target on the PDA.  An arteriotomy was made, and the vein graft was anastomosed to it in an end to side fashion.  Next we exposed the lateral wall, and found a good target on the OM.  An end to side anastomosis with the vein graft was then created.  The left atrial  appendage was then measured.  A 61mm Atriclip was then placed.  Next, we exposed the anterior wall of the heart and identified a good target on 1st diagonal.  This vessel was much bigger than the LAD and covered a larger distribution on the anterolateral wall.  I elected to use the LIMA for this anastomosis.   Finally, we exposed a good target on the LAD LAD, and fashioned an end to side anastomosis between it and the remaining vein.  We began to re-warm, and a re-animation dose of cardioplegia was given.  The heart was de-aired, and the cross clamp was removed.  Meticulous hemostasis was obtained.    A partial occludding clamp was then placed on the ascending aorta, and we created an end to side  anastomosis between it and the proximal vein grafts.  The proximal sites were marked with rings.  Hemostasis was obtained, and we separated from cardiopulmonary bypass without event.the heparin was reversed with protamine.  Chest tubes and wires were placed, and the sternum was re-approximated with with sternal wires.  The soft tissue and skin were re-approximated wth absorbable suture.    The patient tolerated the procedure without any immediate complications, and was transferred to the ICU in guarded condition.  Hejl Bary Leriche

## 2019-04-25 NOTE — Transfer of Care (Signed)
Immediate Anesthesia Transfer of Care Note  Patient: Gabriela Smith  Procedure(s) Performed: CORONARY ARTERY BYPASS GRAFTING (CABG) times four using left internal mammary artery and left greater saphenous vein harvested endoscipically. (N/A Chest) Clipping Of Atrial Appendage (N/A Chest)  Patient Location: SICU  Anesthesia Type:General  Level of Consciousness: sedated, unresponsive and Patient remains intubated per anesthesia plan  Airway & Oxygen Therapy: Patient remains intubated per anesthesia plan and Patient placed on Ventilator (see vital sign flow sheet for setting)  Post-op Assessment: Report given to RN and Post -op Vital signs reviewed and stable  Post vital signs: Reviewed and stable  Last Vitals:  Vitals Value Taken Time  BP    Temp    Pulse    Resp    SpO2      Last Pain:  Vitals:   04/25/19 0508  TempSrc: Oral  PainSc:       Patients Stated Pain Goal: 0 ([id number])  Complications: No apparent anesthesia complications

## 2019-04-25 NOTE — Progress Notes (Signed)
TCTS BRIEF SICU PROGRESS NOTE  Day of Surgery  S/P Procedure(s) (LRB): CORONARY ARTERY BYPASS GRAFTING (CABG) times four using left internal mammary artery and left greater saphenous vein harvested endoscipically. (N/A) Clipping Of Atrial Appendage (N/A)   Sedated on vent NSR w/ stable hemodynamics O2 sats 99% on 50% FiO2 Chest tube output low UOP adequate Labs okay  Plan: Continue routine early postop  Rexene Alberts, MD 04/25/2019 5:41 PM

## 2019-04-26 ENCOUNTER — Encounter: Payer: Self-pay | Admitting: *Deleted

## 2019-04-26 ENCOUNTER — Inpatient Hospital Stay (HOSPITAL_COMMUNITY): Payer: Medicare Other

## 2019-04-26 DIAGNOSIS — Z951 Presence of aortocoronary bypass graft: Secondary | ICD-10-CM

## 2019-04-26 HISTORY — DX: Presence of aortocoronary bypass graft: Z95.1

## 2019-04-26 LAB — BASIC METABOLIC PANEL
Anion gap: 4 — ABNORMAL LOW (ref 5–15)
Anion gap: 8 (ref 5–15)
BUN: 11 mg/dL (ref 8–23)
BUN: 10 mg/dL (ref 8–23)
CO2: 21 mmol/L — ABNORMAL LOW (ref 22–32)
CO2: 23 mmol/L (ref 22–32)
Calcium: 7.6 mg/dL — ABNORMAL LOW (ref 8.9–10.3)
Calcium: 8.5 mg/dL — ABNORMAL LOW (ref 8.9–10.3)
Chloride: 108 mmol/L (ref 98–111)
Chloride: 102 mmol/L (ref 98–111)
Creatinine, Ser: 0.77 mg/dL (ref 0.44–1.00)
Creatinine, Ser: 0.93 mg/dL (ref 0.44–1.00)
GFR calc Af Amer: >60 mL/min (ref >60)
GFR calc Af Amer: >60 mL/min (ref >60)
GFR calc non Af Amer: >60 mL/min (ref >60)
GFR calc non Af Amer: 58 mL/min — ABNORMAL LOW (ref >60)
Glucose, Bld: 122 mg/dL — ABNORMAL HIGH (ref 70–99)
Glucose, Bld: 136 mg/dL — ABNORMAL HIGH (ref 70–99)
Potassium: 3.7 mmol/L (ref 3.5–5.1)
Potassium: 3.7 mmol/L (ref 3.5–5.1)
Sodium: 133 mmol/L — ABNORMAL LOW (ref 135–145)
Sodium: 133 mmol/L — ABNORMAL LOW (ref 135–145)

## 2019-04-26 LAB — CBC
HCT: 24.2 % — ABNORMAL LOW (ref 36.0–46.0)
HCT: 25.3 % — ABNORMAL LOW (ref 36.0–46.0)
Hemoglobin: 8 g/dL — ABNORMAL LOW (ref 12.0–15.0)
Hemoglobin: 8.2 g/dL — ABNORMAL LOW (ref 12.0–15.0)
MCH: 32.5 pg (ref 26.0–34.0)
MCH: 32.3 pg (ref 26.0–34.0)
MCHC: 33.1 g/dL (ref 30.0–36.0)
MCHC: 32.4 g/dL (ref 30.0–36.0)
MCV: 98.4 fL (ref 80.0–100.0)
MCV: 99.6 fL (ref 80.0–100.0)
Platelets: 170 10*3/uL (ref 150–400)
Platelets: 161 10*3/uL (ref 150–400)
RBC: 2.46 MIL/uL — ABNORMAL LOW (ref 3.87–5.11)
RBC: 2.54 MIL/uL — ABNORMAL LOW (ref 3.87–5.11)
RDW: 13.2 % (ref 11.5–15.5)
RDW: 13.6 % (ref 11.5–15.5)
WBC: 9.1 10*3/uL (ref 4.0–10.5)
WBC: 9.5 10*3/uL (ref 4.0–10.5)
nRBC: 0 % (ref 0.0–0.2)
nRBC: 0 % (ref 0.0–0.2)

## 2019-04-26 LAB — GLUCOSE, CAPILLARY
Glucose-Capillary: 115 mg/dL — ABNORMAL HIGH (ref 70–99)
Glucose-Capillary: 118 mg/dL — ABNORMAL HIGH (ref 70–99)
Glucose-Capillary: 121 mg/dL — ABNORMAL HIGH (ref 70–99)
Glucose-Capillary: 123 mg/dL — ABNORMAL HIGH (ref 70–99)
Glucose-Capillary: 135 mg/dL — ABNORMAL HIGH (ref 70–99)
Glucose-Capillary: 160 mg/dL — ABNORMAL HIGH (ref 70–99)

## 2019-04-26 LAB — MAGNESIUM
Magnesium: 2.2 mg/dL (ref 1.7–2.4)
Magnesium: 2.1 mg/dL (ref 1.7–2.4)

## 2019-04-26 MED ORDER — AMIODARONE LOAD VIA INFUSION
150.0000 mg | Freq: Once | INTRAVENOUS | Status: AC
  Administered 2019-04-26: 150 mg via INTRAVENOUS
  Filled 2019-04-26: qty 83.34

## 2019-04-26 MED ORDER — POTASSIUM CHLORIDE 10 MEQ/50ML IV SOLN
10.0000 meq | INTRAVENOUS | Status: AC
  Administered 2019-04-26 – 2019-04-27 (×2): 10 meq via INTRAVENOUS
  Filled 2019-04-26 (×2): qty 50

## 2019-04-26 MED ORDER — ENOXAPARIN SODIUM 40 MG/0.4ML ~~LOC~~ SOLN
40.0000 mg | Freq: Every day | SUBCUTANEOUS | Status: DC
  Administered 2019-04-26 – 2019-04-27 (×2): 40 mg via SUBCUTANEOUS
  Filled 2019-04-26 (×2): qty 0.4

## 2019-04-26 MED ORDER — INSULIN ASPART 100 UNIT/ML ~~LOC~~ SOLN
0.0000 [IU] | SUBCUTANEOUS | Status: DC
  Administered 2019-04-26 – 2019-04-27 (×6): 2 [IU] via SUBCUTANEOUS
  Administered 2019-04-27: 4 [IU] via SUBCUTANEOUS

## 2019-04-26 MED ORDER — POTASSIUM CHLORIDE CRYS ER 20 MEQ PO TBCR
20.0000 meq | EXTENDED_RELEASE_TABLET | ORAL | Status: AC
  Administered 2019-04-26: 20 meq via ORAL
  Filled 2019-04-26: qty 1

## 2019-04-26 MED ORDER — AMIODARONE HCL IN DEXTROSE 360-4.14 MG/200ML-% IV SOLN: 30.0000 mg/h | INTRAVENOUS | Status: DC

## 2019-04-26 MED ORDER — SODIUM CHLORIDE 0.9% FLUSH
10.0000 mL | Freq: Two times a day (BID) | INTRAVENOUS | Status: DC
  Administered 2019-04-26: 20 mL
  Administered 2019-04-27 – 2019-05-01 (×5): 10 mL

## 2019-04-26 MED ORDER — AMIODARONE HCL 200 MG PO TABS
200.0000 mg | ORAL_TABLET | Freq: Two times a day (BID) | ORAL | Status: DC
  Administered 2019-04-27 – 2019-05-02 (×11): 200 mg via ORAL
  Filled 2019-04-26 (×11): qty 1

## 2019-04-26 MED ORDER — AMIODARONE HCL 200 MG PO TABS: 200.0000 mg | ORAL_TABLET | Freq: Every day | ORAL | Status: DC

## 2019-04-26 MED ORDER — AMIODARONE HCL IN DEXTROSE 360-4.14 MG/200ML-% IV SOLN: 60.0000 mg/h | INTRAVENOUS | Status: DC

## 2019-04-26 MED ORDER — AMIODARONE HCL IN DEXTROSE 360-4.14 MG/200ML-% IV SOLN
INTRAVENOUS | Status: AC
  Filled 2019-04-26: qty 200

## 2019-04-26 MED ORDER — SODIUM CHLORIDE 0.9% FLUSH: 10.0000 mL | INTRAVENOUS | Status: DC | PRN

## 2019-04-26 MED ORDER — LIDOCAINE 5 % EX PTCH
1.0000 | MEDICATED_PATCH | CUTANEOUS | Status: DC
  Administered 2019-04-26 – 2019-04-29 (×4): 1 via TRANSDERMAL
  Filled 2019-04-26 (×6): qty 1

## 2019-04-26 MED ORDER — AMIODARONE HCL IN DEXTROSE 360-4.14 MG/200ML-% IV SOLN
60.0000 mg/h | INTRAVENOUS | Status: AC
  Administered 2019-04-26 (×2): 60 mg/h via INTRAVENOUS
  Filled 2019-04-26 (×2): qty 200

## 2019-04-26 MED ORDER — AMIODARONE HCL IN DEXTROSE 360-4.14 MG/200ML-% IV SOLN
30.0000 mg/h | INTRAVENOUS | Status: DC
  Administered 2019-04-27 – 2019-04-28 (×4): 30 mg/h via INTRAVENOUS
  Filled 2019-04-26 (×4): qty 200

## 2019-04-26 MED ORDER — POTASSIUM CHLORIDE 10 MEQ/50ML IV SOLN
10.0000 meq | INTRAVENOUS | Status: AC
  Administered 2019-04-26 (×3): 10 meq via INTRAVENOUS
  Filled 2019-04-26 (×3): qty 50

## 2019-04-26 MED FILL — Potassium Chloride Inj 2 mEq/ML: INTRAVENOUS | Qty: 40 | Status: AC

## 2019-04-26 MED FILL — Heparin Sodium (Porcine) Inj 1000 Unit/ML: INTRAMUSCULAR | Qty: 30 | Status: AC

## 2019-04-26 MED FILL — Lidocaine HCl Local Preservative Free (PF) Inj 2%: INTRAMUSCULAR | Qty: 15 | Status: AC

## 2019-04-26 NOTE — Progress Notes (Signed)
  Amiodarone Drug - Drug Interaction Consult Note  Recommendations: Monitor for bradycardia, AV block, myocardial depression while on metoprolol Monitor for QTc prolongation while on ondansetron  Amiodarone is metabolized by the cytochrome P450 system and therefore has the potential to cause many drug interactions. Amiodarone has an average plasma half-life of 50 days (range 20 to 100 days).   There is potential for drug interactions to occur several weeks or months after stopping treatment and the onset of drug interactions may be slow after initiating amiodarone.   []  Statins: Increased risk of myopathy. Simvastatin- restrict dose to 20mg  daily. Other statins: counsel patients to report any muscle pain or weakness immediately.  []  Anticoagulants: Amiodarone can increase anticoagulant effect. Consider warfarin dose reduction. Patients should be monitored closely and the dose of anticoagulant altered accordingly, remembering that amiodarone levels take several weeks to stabilize.  []  Antiepileptics: Amiodarone can increase plasma concentration of phenytoin, the dose should be reduced. Note that small changes in phenytoin dose can result in large changes in levels. Monitor patient and counsel on signs of toxicity.  [x]  Beta blockers: increased risk of bradycardia, AV block and myocardial depression. Sotalol - avoid concomitant use.  []   Calcium channel blockers (diltiazem and verapamil): increased risk of bradycardia, AV block and myocardial depression.  []   Cyclosporine: Amiodarone increases levels of cyclosporine. Reduced dose of cyclosporine is recommended.  []  Digoxin dose should be halved when amiodarone is started.  []  Diuretics: increased risk of cardiotoxicity if hypokalemia occurs.  []  Oral hypoglycemic agents (glyburide, glipizide, glimepiride): increased risk of hypoglycemia. Patient's glucose levels should be monitored closely when initiating amiodarone therapy.   [x]  Drugs  that prolong the QT interval:  Torsades de pointes risk may be increased with concurrent use - avoid if possible.  Monitor QTc, also keep magnesium/potassium WNL if concurrent therapy can't be avoided. [name] Antibiotics: e.g. fluoroquinolones, erythromycin. . Antiarrhythmics: e.g. quinidine, procainamide, disopyramide, sotalol. . Antipsychotics: e.g. phenothiazines, haloperidol.  . Lithium, tricyclic antidepressants, and methadone.  Ondansetron  Thank You,  Gillermina Hu, PharmD, BCPS, [facility] Clinical Pharmacist 04/26/2019 7:45 PM

## 2019-04-26 NOTE — Discharge Summary (Signed)
Physician Discharge Summary  Patient ID: Gabriela Smith MRN: [id number] DOB/AGE: 81-03-1940 81 y.o.  Admit date: 04/15/2019 Discharge date: 05/02/2019  Admission Diagnoses:  Patient Active Problem List   Diagnosis Date Noted  . S/P CABG x 4 04/26/2019  . Acute CHF (congestive heart failure) ([place]) 04/15/2019  . Disease related peripheral neuropathy 03/07/2019  . BMI 33.0-33.9,adult 02/17/2019  . Intermittent claudication ([place]) 02/17/2019  . Peripheral vascular insufficiency ([place]) 02/17/2019  . Aortic atherosclerosis ([place]) 02/11/2017  . Atrophic vaginitis 09/18/2016  . S/P right THA, AA 12/25/2015  . S/P hip replacement 12/25/2015  . AAA (abdominal aortic aneurysm) without rupture ([place]) 08/24/2015  . Statin intolerance 05/23/2013  . Osteopenia 04/20/2013  . Degenerative disc disease, lumbar 02/08/2013  . Vitamin D deficiency 02/08/2013  . Overweight (BMI 25.0-29.9) 11/24/2012  . S/P left TKA 11/22/2012  . Malignant neoplasm of urinary bladder ([place]) 11/04/2012  . Osteoarthritis of left knee 11/04/2012  . Hyperlipidemia LDL goal <70 11/28/2008  . Hypertension 11/28/2008  . CORONARY ATHEROSCLEROSIS NATIVE CORONARY ARTERY 11/28/2008   Discharge Diagnoses:   Patient Active Problem List   Diagnosis Date Noted  . S/P CABG x 4 04/26/2019  . Acute CHF (congestive heart failure) ([place]) 04/15/2019  . Disease related peripheral neuropathy 03/07/2019  . BMI 33.0-33.9,adult 02/17/2019  . Intermittent claudication ([place]) 02/17/2019  . Peripheral vascular insufficiency ([place]) 02/17/2019  . Aortic atherosclerosis ([place]) 02/11/2017  . Atrophic vaginitis 09/18/2016  . S/P right THA, AA 12/25/2015  . S/P hip replacement 12/25/2015  . AAA (abdominal aortic aneurysm) without rupture ([place]) 08/24/2015  . Statin intolerance 05/23/2013  . Osteopenia 04/20/2013  . Degenerative disc disease, lumbar 02/08/2013  . Vitamin D deficiency 02/08/2013  . Overweight (BMI 25.0-29.9) 11/24/2012  . S/P left TKA  11/22/2012  . Malignant neoplasm of urinary bladder ([place]) 11/04/2012  . Osteoarthritis of left knee 11/04/2012  . Hyperlipidemia LDL goal <70 11/28/2008  . Hypertension 11/28/2008  . CORONARY ATHEROSCLEROSIS NATIVE CORONARY ARTERY 11/28/2008   Discharged Condition: good  History of Present Illness:  [name] is an 79 yo obese white female with history of CAD, S/P PCI to LAD in 1993, HTN, and hyperlipidemia.  The patient presented with complaints of shortness of breath and weight gain.  She was admitted on 04/15/2019. During her hospitalization she developed Atrial Fibrillation with RVR and an elevated troponin level.  She underwent cardiac catheterization which showed 3 vessel CAD.  It was felt coronary bypass grafting would be indicated and TCTS consult was requested.  She was evaluated by Dr. Lowella Dell, who talked to the patient about the risks and benefits of proceeding with the surgery.  She initially did not wish to proceed, but after further discuss with her Cardiologist she decided to proceed with surgery.  Hospital Course:   She was taken to the operating room on 04/25/2019.  She underwent CABG x 4 utilizing LIMA to Diagonal, RSVG to LAD, RSVG to PDA, and RSVG to OM1.  She also underwent endoscopic harvest of greater saphenous vein from her right and left thighs.  She tolerated the procedure without difficulty and was taken to the SICU in stable condition.  She was extubated the evening of surgery.  During her stay in the SICU the patient was weaned off Neo as tolerated.  She developed rapid Atrial Fibrillation.  She was treated with IV Amiodarone bolus and drip.  She did not convert but her rate was better controlled.  She was again re-bolused with Amiodarone.  Her Lopressor was  titrated as BP allowed.  She continued to have Atrial Fibrillation with RVR at times.  She was started on Digoxin and Eliquis per Cardiology recommendations.  Her TSH level was checked and was elevated at 8.256.  She  will need to follow up with her primary physician for management of this.  She developed complaints of shortness of breath.  CXR showed a minimal left pleural effusion.  She was stable for transfer to the progressive unit on 04/30/2018.  The patient continued to make good progress.  She remains in Atrial Fibrillation with good rate control.  She is ambulating without much difficulty.  Her surgical incisions are healing without evidence of infection.  She is medically stable for discharge home today.     Consults: cardiology  Significant Diagnostic Studies: angiography:    LEFT CORONARY ARTERY  Ost LM lesion is 40% stenosed. Mid LM to Ost LAD lesion is 85% stenosed with 95% stenosed side branch in Ost Cx.  Prox LAD to Mid LAD lesion is 90% stenosed -proximal to 1st Diag; Mid LAD lesion is 80% stenosed after 1st Diag  1st Diag lesion is 10% stenosed with 55% stenosed side branch in Lat 1st Diag.  Prox Cx to Mid Cx lesion is 35% stenosed.  RIGHT CORONARY ARTERY  Ost RCA to Prox RCA lesion is 60% stenosed. Prox RCA lesion is 70% stenosed.  Mid RCA to Dist RCA lesion is 90% stenosed.  Dist RCA lesion is 20% stenosed with 50% stenosed side branch in RPAV. RPAV lesion is 65% stenosed. RPDA lesion is 50% stenosed.  Treatments: surgery:   Procedure: CABG X 4.  LIMA D1, RSVG LAD, PDA, OM1   Atriclip placement 67mm Endoscopic greater saphenous vein harvest on bilateral thighs Intra-operative Transesophageal Echocardiogram  Discharge Exam: Blood pressure (!) 118/57, pulse (!) 114, temperature 98.8 F (37.1 C), temperature source Oral, resp. rate 18, height 5\' 3"  (1.6 m), weight 85 kg, SpO2 98 %.   General appearance: alert, cooperative and no distress Heart: irregularly irregular rhythm Lungs: clear to auscultation bilaterally Abdomen: soft, non-tender; bowel sounds normal; no masses,  no organomegaly Extremities: edema trace Wound: clean and dry  Discharge disposition: 01-Home or  Self Care  Discharge Medications:   The patient has been discharged on:   1.Beta Blocker:  Yes [ X  ]                              No   [   ]                              If No, reason:  2.Ace Inhibitor/ARB: Yes [   ]                                     No  Valu.Nieves    ]                                     If No, reason: labile 3.Statin:   Yes [   ]                  No  Valu.Nieves   ]  If No, reason: Intolerance  4.Shela CommonsVelta Addison  [  X ]                  No   [   ]                  If No, reason:  Discharge Instructions    Amb Referral to Cardiac Rehabilitation   Complete by: As directed    Diagnosis: CABG   CABG X ___: 4   After initial evaluation and assessments completed: Virtual Based Care may be provided alone or in conjunction with Phase 2 Cardiac Rehab based on patient barriers.: Yes     Allergies as of 05/02/2019      Reactions   Fish Allergy Hives, Shortness Of Breath, Other (See Comments)   "Bottom-feeder fish"   Fish-derived Products Hives, Shortness Of Breath, Other (See Comments)   Bottom-feeder fish   Shellfish-derived Products Hives, Shortness Of Breath   Lasix [furosemide] Swelling, Other (See Comments)   Edema, elevated BP   Macrodantin [nitrofurantoin Macrocrystal] Other (See Comments)   Unknown reaction    Vicodin [hydrocodone-acetaminophen] Nausea And Vomiting, Other (See Comments)   SEVERE N/V   Zetia [ezetimibe] Other (See Comments)   Muscle aches   Codeine Rash   Latex Itching, Rash   Reports Elastic in underwear, under wire bras, and now surgical cap cause rash and itching.    Niacin Other (See Comments)   Muscle aches   Niaspan [niacin Er] Other (See Comments)   Muscle aches   Statins Other (See Comments)   Muscle aches/ cramps   Sulfa Antibiotics Rash, Other (See Comments)   Muscle aches/ cramps, also      Medication List    STOP taking these medications   hydrochlorothiazide 12.5 MG capsule Commonly known as: MICROZIDE     TAKE  these medications   acetaminophen 500 MG tablet Commonly known as: TYLENOL Take 2 tablets (1,000 mg total) by mouth every 8 (eight) hours. What changed:   when to take this  reasons to take this   amiodarone 200 MG tablet Commonly known as: PACERONE Take 1 tablet (200 mg total) by mouth 2 (two) times daily. For 2 days, then decrease to 200 mg daily Notes to patient: Take this afternoon. Then take 1 pill tomorrow.   apixaban 5 MG Tabs tablet Commonly known as: ELIQUIS Take 1 tablet (5 mg total) by mouth 2 (two) times daily. Notes to patient: Take this evening    aspirin 81 MG EC tablet Take 1 tablet (81 mg total) by mouth daily. Notes to patient: Take tomorrow   b complex vitamins tablet Take 1 tablet by mouth daily after breakfast.   betamethasone valerate 0.1 % cream Commonly known as: VALISONE Apply 1 application topically at bedtime. Apply externally to vaginal area   bumetanide 1 MG tablet Commonly known as: BUMEX Take 1 tablet (1 mg total) by mouth daily. Notes to patient: Take tomorrow   cholecalciferol 1000 units tablet Commonly known as: VITAMIN D Take 1,000 Units by mouth daily after breakfast. Notes to patient: Take tomorrow   clindamycin 2 % vaginal cream Commonly known as: CLEOCIN Place 1 Applicatorful vaginally at bedtime.   Co-Enzyme Q10 200 MG Caps Take 200 mg by mouth daily after breakfast.   digoxin 0.125 MG tablet Commonly known as: LANOXIN Take 1 tablet (0.125 mg total) by mouth daily. Notes to patient: Take tomorrow   Flax Seed Oil 1000 MG Caps Take 1,000  mg by mouth daily after breakfast.   furosemide 40 MG tablet Commonly known as: LASIX Take 1 tablet (40 mg total) by mouth daily. Notes to patient: Take tomorrow   gabapentin 100 MG capsule Commonly known as: NEURONTIN Take 1 capsule (100 mg total) by mouth at bedtime. Notes to patient: Take at bedtime   Magnesium 300 MG Caps Take 300 mg by mouth at bedtime. Notes to patient:  Take tonight @ bedtime   metoprolol tartrate 50 MG tablet Commonly known as: LOPRESSOR TAKE (1) TABLET TWICE A DAY. What changed: See the new instructions. Notes to patient: Take this evening   nitroGLYCERIN 0.4 MG SL tablet Commonly known as: NITROSTAT PLACE 1 TABLET UNDER THE TONGUE AT ONSET OF CHEST PAIN EVERY 5 MINTUES UP TO 3 TIMES AS NEEDED What changed: See the new instructions.   OVER THE COUNTER MEDICATION Take 1 tablet by mouth daily after breakfast. Elderberry/Vitamin C   Potassium Chloride ER 20 MEQ Tbcr Take 20 mEq by mouth daily. Notes to patient: Take tomorrow   traMADol 50 MG tablet Commonly known as: ULTRAM Take 1-2 tablets (50-100 mg total) by mouth every 4 (four) hours as needed for moderate pain.   TURMERIC PO Take 1 capsule by mouth daily after breakfast.   vitamin B-12 500 MCG tablet Commonly known as: CYANOCOBALAMIN Take 1 tablet (500 mcg total) by mouth daily. What changed:   medication strength  how much to take Notes to patient: Take tomorrow        Signed:  Ellwood Handler, PA-C  05/02/2019, 12:44 PM

## 2019-04-26 NOTE — Progress Notes (Signed)
Patient ID: Gabriela Smith, female   DOB: [birth year], 79 y.o.   MRN: [id number]  After rounding this evening developed rapid afib started on Cordarone   Grace Isaac MD      [address] [place] [address] Office [phone]

## 2019-04-26 NOTE — Progress Notes (Signed)
Patient ID: Gabriela Smith, female   DOB: [birth year], 79 y.o.   MRN: [id number] EVENING ROUNDS NOTE :     [address]       [place] [address]             [phone]                 1 Day Post-Op Procedure(s) (LRB): CORONARY ARTERY BYPASS GRAFTING (CABG) times four using left internal mammary artery and left greater saphenous vein harvested endoscipically. (N/A) Clipping Of Atrial Appendage (N/A)  Total Length of Stay:  LOS: 11 days  BP (!) 98/55   Pulse 87   Temp 98.3 F (36.8 C) (Oral)   Resp (!) 21   Ht 5\' 3"  (1.6 m)   Wt 87.3 kg   SpO2 92%   BMI 34.09 kg/m   .Intake/Output      02/15 0701 - 02/16 0700 02/16 0701 - 02/17 0700   P.O.     I.V. (mL/kg) 2972.5 (34) 150.4 (1.7)   Blood 300    IV Piggyback 1458.7    Total Intake(mL/kg) 4731.2 (54.2) 150.4 (1.7)   Urine (mL/kg/hr) 1925 (0.9) 1850 (1.9)   Emesis/NG output 0    Blood 800    Chest Tube 310 0   Total Output 3035 1850   Net +1696.2 -1699.6        Urine Occurrence  1 x     . sodium chloride 20 mL/hr at 04/26/19 1000  . sodium chloride    . sodium chloride    . cefUROXime (ZINACEF)  IV 1.5 g (04/26/19 1546)  . insulin Stopped (04/25/19 1802)  . lactated ringers    . lactated ringers Stopped (04/26/19 0906)  . lactated ringers    . phenylephrine (NEO-SYNEPHRINE) Adult infusion 10 mcg/min (04/26/19 1000)     Lab Results  Component Value Date   WBC 9.5 04/26/2019   HGB 8.2 (L) 04/26/2019   HCT 25.3 (L) 04/26/2019   PLT 161 04/26/2019   GLUCOSE 136 (H) 04/26/2019   CHOL 227 (H) 04/16/2019   TRIG 131 04/16/2019   HDL 59 04/16/2019   LDLDIRECT 193 (H) 10/21/2016   LDLCALC 142 (H) 04/16/2019   ALT 45 (H) 04/15/2019   AST 30 04/15/2019   NA 133 (L) 04/26/2019   K 3.7 04/26/2019   CL 102 04/26/2019   CREATININE 0.93 04/26/2019   BUN 10 04/26/2019   CO2 23 04/26/2019   TSH 2.826 04/15/2019   INR 1.5 (H) 04/25/2019   HGBA1C 5.7 (H) 04/24/2019   CABG yesterday Stable day Sat  in chair not walking much yet   Grace Isaac MD  [address] [phone] Office [phone] 04/26/2019 6:22 PM

## 2019-04-26 NOTE — Progress Notes (Signed)
      [address]       [place] [address]             [phone]                 1 Day Post-Op Procedure(s) (LRB): CORONARY ARTERY BYPASS GRAFTING (CABG) times four using left internal mammary artery and left greater saphenous vein harvested endoscipically. (N/A) Clipping Of Atrial Appendage (N/A)   Events: No events.  Extubated overnight.  Complains of some back pain _______________________________________________________________ Vitals: BP (!) 100/49   Pulse 88   Temp 97.8 F (36.6 C) (Oral)   Resp 18   Ht 5\' 3"  (1.6 m)   Wt 87.3 kg   SpO2 (!) 88%   BMI 34.09 kg/m   - Neuro: alert NAD  - Cardiovascular: Sinus  Drips: neo 5.   CVP:  [5 mmHg-18 mmHg] 8 mmHg CO:  [4 L/min-5.5 L/min] 5.5 L/min CI:  [2 L/min/m2-3 L/min/m2] 3 L/min/m2  - Pulm: EWOB, Clear  ABG    Component Value Date/Time   PHART 7.349 (L) 04/25/2019 2031   PCO2ART 40.8 04/25/2019 2031   PO2ART 103.0 04/25/2019 2031   HCO3 22.6 04/25/2019 2031   TCO2 24 04/25/2019 2031   ACIDBASEDEF 3.0 (H) 04/25/2019 2031   O2SAT 98.0 04/25/2019 2031    - Abd: soft NT - Extremity: edematous  .Intake/Output      02/15 0701 - 02/16 0700 02/16 0701 - 02/17 0700   P.O.     I.V. (mL/kg) 2972.5 (34) 150.4 (1.7)   Blood 300    IV Piggyback 1458.7    Total Intake(mL/kg) 4731.2 (54.2) 150.4 (1.7)   Urine (mL/kg/hr) 1925 (0.9) 50 (0.1)   Emesis/NG output 0    Blood 800    Chest Tube 310 0   Total Output 3035 50   Net +1696.2 +100.4           _______________________________________________________________ Labs: CBC Latest Ref Rng & Units 04/26/2019 04/25/2019 04/25/2019  WBC 4.0 - 10.5 K/uL 9.1 - 9.6  Hemoglobin 12.0 - 15.0 g/dL 8.0(L) 8.8(L) 9.1(L)  Hematocrit 36.0 - 46.0 % 24.2(L) 26.0(L) 27.6(L)  Platelets 150 - 400 K/uL 170 - 161   CMP Latest Ref Rng & Units 04/26/2019 04/25/2019 04/25/2019  Glucose 70 - 99 mg/dL 122(H) - 145(H)  BUN 8 - 23 mg/dL 11 - 13  Creatinine 0.44 - 1.00  mg/dL 0.77 - 0.82  Sodium 135 - 145 mmol/L 133(L) 138 135  Potassium 3.5 - 5.1 mmol/L 3.7 4.2 4.1  Chloride 98 - 111 mmol/L 108 - 107  CO2 22 - 32 mmol/L 21(L) - 20(L)  Calcium 8.9 - 10.3 mg/dL 7.6(L) - 8.0(L)  Total Protein 6.5 - 8.1 g/dL - - -  Total Bilirubin 0.3 - 1.2 mg/dL - - -  Alkaline Phos 38 - 126 U/L - - -  AST 15 - 41 U/L - - -  ALT 0 - 44 U/L - - -    CXR: clear  _______________________________________________________________  Assessment and Plan: POD 1 s/p CABG atriclip  Neuro: pain controlled. Added lidocaine patch CV: weaning neo.  Will start BB.  Will discuss with cardiology in regards to statin and aspirin due to pt allergies Pulm: pulm toilet and ambulation today Renal: will hold off on diuresis today GI: advancing diet Heme: stable ID: [email or web address] Endo: SSI Dispo: continue ICU care  Melodie Bouillon, MD 04/26/2019 10:59 AM

## 2019-04-27 ENCOUNTER — Inpatient Hospital Stay (HOSPITAL_COMMUNITY): Payer: Medicare Other

## 2019-04-27 ENCOUNTER — Ambulatory Visit: Payer: Medicare Other | Admitting: Thoracic Surgery (Cardiothoracic Vascular Surgery)

## 2019-04-27 DIAGNOSIS — E782 Mixed hyperlipidemia: Secondary | ICD-10-CM

## 2019-04-27 DIAGNOSIS — Z951 Presence of aortocoronary bypass graft: Secondary | ICD-10-CM

## 2019-04-27 LAB — BASIC METABOLIC PANEL
Anion gap: 11 (ref 5–15)
Anion gap: 8 (ref 5–15)
BUN: 12 mg/dL (ref 8–23)
BUN: 14 mg/dL (ref 8–23)
CO2: 25 mmol/L (ref 22–32)
CO2: 21 mmol/L — ABNORMAL LOW (ref 22–32)
Calcium: 8.8 mg/dL — ABNORMAL LOW (ref 8.9–10.3)
Calcium: 8.5 mg/dL — ABNORMAL LOW (ref 8.9–10.3)
Chloride: 99 mmol/L (ref 98–111)
Chloride: 102 mmol/L (ref 98–111)
Creatinine, Ser: 0.82 mg/dL (ref 0.44–1.00)
Creatinine, Ser: 0.76 mg/dL (ref 0.44–1.00)
GFR calc Af Amer: >60 mL/min (ref >60)
GFR calc Af Amer: >60 mL/min (ref >60)
GFR calc non Af Amer: >60 mL/min (ref >60)
GFR calc non Af Amer: >60 mL/min (ref >60)
Glucose, Bld: 137 mg/dL — ABNORMAL HIGH (ref 70–99)
Glucose, Bld: 132 mg/dL — ABNORMAL HIGH (ref 70–99)
Potassium: 4.3 mmol/L (ref 3.5–5.1)
Potassium: 3.5 mmol/L (ref 3.5–5.1)
Sodium: 135 mmol/L (ref 135–145)
Sodium: 131 mmol/L — ABNORMAL LOW (ref 135–145)

## 2019-04-27 LAB — ECHO INTRAOPERATIVE TEE
Height: 63 in
Weight: 2895.96 oz

## 2019-04-27 LAB — GLUCOSE, CAPILLARY
Glucose-Capillary: 114 mg/dL — ABNORMAL HIGH (ref 70–99)
Glucose-Capillary: 126 mg/dL — ABNORMAL HIGH (ref 70–99)
Glucose-Capillary: 134 mg/dL — ABNORMAL HIGH (ref 70–99)
Glucose-Capillary: 137 mg/dL — ABNORMAL HIGH (ref 70–99)
Glucose-Capillary: 155 mg/dL — ABNORMAL HIGH (ref 70–99)
Glucose-Capillary: 156 mg/dL — ABNORMAL HIGH (ref 70–99)
Glucose-Capillary: 195 mg/dL — ABNORMAL HIGH (ref 70–99)

## 2019-04-27 LAB — MAGNESIUM: Magnesium: 2.2 mg/dL (ref 1.7–2.4)

## 2019-04-27 LAB — CBC
HCT: 24.4 % — ABNORMAL LOW (ref 36.0–46.0)
Hemoglobin: 7.7 g/dL — ABNORMAL LOW (ref 12.0–15.0)
MCH: 32.1 pg (ref 26.0–34.0)
MCHC: 31.6 g/dL (ref 30.0–36.0)
MCV: 101.7 fL — ABNORMAL HIGH (ref 80.0–100.0)
Platelets: 151 10*3/uL (ref 150–400)
RBC: 2.4 MIL/uL — ABNORMAL LOW (ref 3.87–5.11)
RDW: 14 % (ref 11.5–15.5)
WBC: 9.8 10*3/uL (ref 4.0–10.5)
nRBC: 0 % (ref 0.0–0.2)

## 2019-04-27 LAB — TSH: TSH: 8.256 u[IU]/mL — ABNORMAL HIGH (ref 0.350–4.500)

## 2019-04-27 MED ORDER — METOCLOPRAMIDE HCL 5 MG/ML IJ SOLN
10.0000 mg | Freq: Four times a day (QID) | INTRAMUSCULAR | Status: AC
  Administered 2019-04-27 (×2): 10 mg via INTRAVENOUS
  Filled 2019-04-27 (×3): qty 2

## 2019-04-27 MED ORDER — AMIODARONE IV BOLUS ONLY 150 MG/100ML
150.0000 mg | Freq: Once | INTRAVENOUS | Status: AC
  Administered 2019-04-27: 150 mg via INTRAVENOUS

## 2019-04-27 NOTE — Progress Notes (Signed)
Progress Note  Patient Name: Gabriela Smith Date of Encounter: 04/27/2019  Primary Cardiologist: Minus Breeding, MD   Subjective   Patient is 2 days status post bypass grafting.  She is in atrial fibrillation this morning.  She is on amiodarone drip.  She had left atrial appendage clipping during her bypass surgery. She complains of some nausea and also pain on her buttocks.  Inpatient Medications    Scheduled Meds: . acetaminophen  1,000 mg Oral Q6H   Or  . acetaminophen (TYLENOL) oral liquid 160 mg/5 mL  1,000 mg Per Tube Q6H  . amiodarone  200 mg Oral Q12H   Followed by  . [START ON 05/04/2019] amiodarone  200 mg Oral Daily  . aspirin EC  81 mg Oral Daily  . B-complex with vitamin C  1 tablet Oral Daily  . bisacodyl  10 mg Oral Daily   Or  . bisacodyl  10 mg Rectal Daily  . bumetanide  1 mg Oral Daily  . Chlorhexidine Gluconate Cloth  6 each Topical Daily  . cholecalciferol  1,000 Units Oral Daily  . docusate sodium  200 mg Oral Daily  . enoxaparin (LOVENOX) injection  40 mg Subcutaneous QHS  . gabapentin  100 mg Oral QHS  . insulin aspart  0-24 Units Subcutaneous Q4H  . lidocaine  1 patch Transdermal Q24H  . metoCLOPramide (REGLAN) injection  10 mg Intravenous Q6H  . metoprolol tartrate  12.5 mg Oral BID   Or  . metoprolol tartrate  12.5 mg Per Tube BID  . pantoprazole  40 mg Oral Daily  . sodium chloride flush  10-40 mL Intracatheter Q12H  . sodium chloride flush  3 mL Intravenous Q12H  . vitamin B-12  500 mcg Oral Daily   Continuous Infusions: . sodium chloride 10 mL/hr at 04/27/19 0600  . sodium chloride    . sodium chloride    . amiodarone 30 mg/hr (04/27/19 0600)  . amiodarone    . insulin Stopped (04/25/19 1802)  . lactated ringers    . lactated ringers Stopped (04/26/19 0906)  . lactated ringers     PRN Meds: sodium chloride, dextrose, lactated ringers, metoprolol tartrate, midazolam, ondansetron (ZOFRAN) IV, oxyCODONE, sodium chloride flush, sodium  chloride flush, traMADol   Vital Signs    Vitals:   04/27/19 0530 04/27/19 0600 04/27/19 0630 04/27/19 0815  BP: (!) 100/59 97/60 94/74    Pulse: (!) 127 (!) 114 (!) 110   Resp: 18 18 16    Temp:    (!) 97.2 F (36.2 C)  TempSrc:    Oral  SpO2: 96% 95% 95%   Weight:      Height:        Intake/Output Summary (Last 24 hours) at 04/27/2019 0840 Last data filed at 04/27/2019 0600 Gross per 24 hour  Intake 1567.4 ml  Output 2500 ml  Net -932.6 ml   Last 3 Weights 04/27/2019 04/26/2019 04/25/2019  Weight (lbs) 192 lb 11.2 oz 192 lb 7.4 oz 181 lb  Weight (kg) 87.408 kg 87.3 kg 82.1 kg      Telemetry     Afib with HR 105.   Personally Reviewed  ECG     - Personally Reviewed  Physical Exam   GEN:  elderly female.  Uncomfortable but NAD Neck: No JVD Cardiac:  irreg. Irreg.  + systolic murmur  Respiratory: Clear to auscultation bilaterally. GI: Soft, nontender, non-distended  MS: No edema; No deformity. Neuro:  Nonfocal  Psych: Normal affect  Labs    High Sensitivity Troponin:   Recent Labs  Lab 04/15/19 1214 04/15/19 1423 04/15/19 2313 04/16/19 0057 04/16/19 0834  TROPONINIHS 156* 167* 340* 493* 440*      Chemistry Recent Labs  Lab 04/26/19 0334 04/26/19 1544 04/27/19 0330  NA 133* 133* 135  K 3.7 3.7 4.3  CL 108 102 99  CO2 21* 23 25  GLUCOSE 122* 136* 137*  BUN 11 10 12   CREATININE 0.77 0.93 0.82  CALCIUM 7.6* 8.5* 8.8*  GFRNONAA >60 58* >60  GFRAA >60 >60 >60  ANIONGAP 4* 8 11     Hematology Recent Labs  Lab 04/26/19 0334 04/26/19 1544 04/27/19 0330  WBC 9.1 9.5 9.8  RBC 2.46* 2.54* 2.40*  HGB 8.0* 8.2* 7.7*  HCT 24.2* 25.3* 24.4*  MCV 98.4 99.6 101.7*  MCH 32.5 32.3 32.1  MCHC 33.1 32.4 31.6  RDW 13.2 13.6 14.0  PLT 170 161 151    BNPNo results for input(s): BNP, PROBNP in the last 168 hours.   DDimer No results for input(s): DDIMER in the last 168 hours.   Radiology    DG Chest Port 1 View  Result Date: 04/26/2019 CLINICAL  DATA:  Chest tube, post CABG EXAM: PORTABLE CHEST 1 VIEW COMPARISON:  04/25/2019 FINDINGS: Interval extubation and removal of NG tube. Left chest tube remains in place. No pneumothorax. Cardiomegaly. Bibasilar atelectasis, left greater than right, slightly increased on the left since prior study. No visible significant effusions. No acute bony abnormality. IMPRESSION: Interval extubation. Increasing left basilar atelectasis. No pneumothorax. Electronically Signed   By: Rolm Baptise M.D.   On: 04/26/2019 08:39   DG Chest Port 1 View  Result Date: 04/25/2019 CLINICAL DATA:  Status post coronary bypass grafting EXAM: PORTABLE CHEST 1 VIEW COMPARISON:  04/24/2019 FINDINGS: Endotracheal tube, gastric catheter, mediastinal drain and left thoracostomy catheter are noted. Right jugular central line is seen as well. No pneumothorax is seen. Patchy left basilar atelectasis is noted. No focal confluent infiltrate is seen. IMPRESSION: Tubes and lines as described above. Mild left basilar atelectasis. Electronically Signed   By: Inez Catalina M.D.   On: 04/25/2019 14:03    Cardiac Studies      Patient Profile     79 y.o. female   [place]    1.  CAD :   S/p CABG.   Hemodynamics look good She has an ASA allergy.   Could consider Plavix post op.  Will defer to Dr. Kipp Brood on the timing of initiation.   2.  Atrial fib :  S/p left atrial appendage clipping .  Cont. IV amiodarone  If she converts to NSR on the amio, it might be reasonable to hold off on eliquis.   If she remains in Afib, she will probably need to be discharged on Eliquis and Plavix  3.  Hyperlipidemia:  She has a statin allergy.   We will have her seen at the lipid clinic for consideration of PCSK-9 inhibitor once she is discharged from the hospital .      For questions or updates, please contact [place] Please consult [email or web address] for contact info under        Signed, Mertie Moores, MD  04/27/2019, 8:40 AM

## 2019-04-27 NOTE — Progress Notes (Signed)
[facility] DAILY ICU PROGRESS NOTE                   [address]            [place] [address]          [phone]   2 Days Post-Op Procedure(s) (LRB): CORONARY ARTERY BYPASS GRAFTING (CABG) times four using left internal mammary artery and left greater saphenous vein harvested endoscipically. (N/A) Clipping Of Atrial Appendage (N/A)  Total Length of Stay:  LOS: 12 days   Subjective:  Patient up in chair,   She is doing okay.  She does have some nausea, that isn't resolving with zofran.  She is ambulating with assistance  Objective: Vital signs in last 24 hours: Temp:  [97.9 F (36.6 C)-98.3 F (36.8 C)] 97.9 F (36.6 C) (02/17 0000) Pulse Rate:  [80-146] 110 (02/17 0630) Cardiac Rhythm: Atrial fibrillation (02/17 0400) Resp:  [15-30] 16 (02/17 0630) BP: (73-109)/(48-74) 94/74 (02/17 0630) SpO2:  [88 %-98 %] 95 % (02/17 0630) Arterial Line BP: (98-118)/(35-40) 118/40 (02/16 1045) Weight:  [87.4 kg] 87.4 kg (02/17 0515)  Filed Weights   04/25/19 0300 04/26/19 0530 04/27/19 0515  Weight: 82.1 kg 87.3 kg 87.4 kg    Weight change: 0.108 kg   Hemodynamic parameters for last 24 hours: CVP:  [7 mmHg-11 mmHg] 8 mmHg  Intake/Output from previous day: 02/16 0701 - 02/17 0700 In: 1567.4 [P.O.:480; I.V.:808.2; IV Piggyback:279.2] Out: 2550 [Urine:2400; Chest Tube:150]  Current Meds: Scheduled Meds: . acetaminophen  1,000 mg Oral Q6H   Or  . acetaminophen (TYLENOL) oral liquid 160 mg/5 mL  1,000 mg Per Tube Q6H  . amiodarone  200 mg Oral Q12H   Followed by  . [START ON 05/04/2019] amiodarone  200 mg Oral Daily  . aspirin EC  81 mg Oral Daily  . B-complex with vitamin C  1 tablet Oral Daily  . bisacodyl  10 mg Oral Daily   Or  . bisacodyl  10 mg Rectal Daily  . bumetanide  1 mg Oral Daily  . Chlorhexidine Gluconate Cloth  6 each Topical Daily  . cholecalciferol  1,000 Units Oral Daily  . docusate sodium  200 mg Oral Daily  . enoxaparin (LOVENOX) injection   40 mg Subcutaneous QHS  . gabapentin  100 mg Oral QHS  . insulin aspart  0-24 Units Subcutaneous Q4H  . lidocaine  1 patch Transdermal Q24H  . metoCLOPramide (REGLAN) injection  10 mg Intravenous Q6H  . metoprolol tartrate  12.5 mg Oral BID   Or  . metoprolol tartrate  12.5 mg Per Tube BID  . pantoprazole  40 mg Oral Daily  . sodium chloride flush  10-40 mL Intracatheter Q12H  . sodium chloride flush  3 mL Intravenous Q12H  . vitamin B-12  500 mcg Oral Daily   Continuous Infusions: . sodium chloride 10 mL/hr at 04/27/19 0600  . sodium chloride    . sodium chloride    . amiodarone 30 mg/hr (04/27/19 0600)  . amiodarone    . insulin Stopped (04/25/19 1802)  . lactated ringers    . lactated ringers Stopped (04/26/19 0906)  . lactated ringers     PRN Meds:.sodium chloride, dextrose, lactated ringers, metoprolol tartrate, midazolam, ondansetron (ZOFRAN) IV, oxyCODONE, sodium chloride flush, sodium chloride flush, traMADol  General appearance: alert, cooperative and no distress Heart: irregularly irregular rhythm Lungs: clear to auscultation bilaterally Abdomen: soft, non-tender; bowel sounds normal; no masses,  no organomegaly Extremities:  edema trace Wound: EVH sites clean and dry, ecchymosis of bilateral thigh, aquacel in place on sternotomy  Lab Results: CBC: Recent Labs    04/26/19 1544 04/27/19 0330  WBC 9.5 9.8  HGB 8.2* 7.7*  HCT 25.3* 24.4*  PLT 161 151   BMET:  Recent Labs    04/26/19 1544 04/27/19 0330  NA 133* 135  K 3.7 4.3  CL 102 99  CO2 23 25  GLUCOSE 136* 137*  BUN 10 12  CREATININE 0.93 0.82  CALCIUM 8.5* 8.8*    CMET: Lab Results  Component Value Date   WBC 9.8 04/27/2019   HGB 7.7 (L) 04/27/2019   HCT 24.4 (L) 04/27/2019   PLT 151 04/27/2019   GLUCOSE 137 (H) 04/27/2019   CHOL 227 (H) 04/16/2019   TRIG 131 04/16/2019   HDL 59 04/16/2019   LDLDIRECT 193 (H) 10/21/2016   LDLCALC 142 (H) 04/16/2019   ALT 45 (H) 04/15/2019   AST 30  04/15/2019   NA 135 04/27/2019   K 4.3 04/27/2019   CL 99 04/27/2019   CREATININE 0.82 04/27/2019   BUN 12 04/27/2019   CO2 25 04/27/2019   TSH 8.256 (H) 04/27/2019   INR 1.5 (H) 04/25/2019   HGBA1C 5.7 (H) 04/24/2019      PT/INR:  Recent Labs    04/25/19 1342  LABPROT 18.1*  INR 1.5*   Radiology: No results found.   Assessment/Plan: S/P Procedure(s) (LRB): CORONARY ARTERY BYPASS GRAFTING (CABG) times four using left internal mammary artery and left greater saphenous vein harvested endoscipically. (N/A) Clipping Of Atrial Appendage (N/A)  1. CV- Atrial Fibrillation, rates in the 120s- on Amiodarone gtt, will re-bolus Amiodarone this morning, continue Lopressor 12.5 mg BID as tolerated 2. Pulm- no acute issues, 150 cc output yesterday, currently hooked up to JP drains, CXR with atelectasis, mild pleural effusion 3. Renal- creatinine stable, K is up  To 4.3, on Bumex 4. Expected post operative blood loss anemia, moderate Hgb at 7.7, with age and A. Fib may benefit from transfusion of a unit, will discuss with Dr. Kipp Brood 5. Hypothyroidism- TSH obtained > 8, likely attributing to A. Fib, will need to be started on Synthroid will discuss with Dr. Kipp Brood 6. Thrombocytopenia, Plt count relatively stable, ct at 151 7. Dispo- patient stable, remains in A. Fib rebolus Amiodarone, continue Lopressor, will discuss synthroid and blood transfusion with Dr. Kipp Brood, continue current care  Ellwood Handler, PA-C

## 2019-04-27 NOTE — Progress Notes (Signed)
CT surgery p.m. Rounds  Patient complaining of some chest tightness related to sternal incision, O2 saturation 97% Patient on IV amiodarone for rapid atrial fibrillation-repeat 150 mg bolus just administered.  Patient taking p.o. metoprolol and blood pressure stable. Blood pressure 122/80, pulse (!) 128, temperature 98 F (36.7 C), temperature source Oral, resp. rate 17, height 5\' 3"  (1.6 m), weight 87.4 kg, SpO2 96 %. Will check p.m. BMP for potassium and supplement as needed

## 2019-04-28 ENCOUNTER — Inpatient Hospital Stay (HOSPITAL_COMMUNITY): Payer: Medicare Other

## 2019-04-28 LAB — BPAM RBC
Blood Product Expiration Date: 202103242359
Blood Product Expiration Date: 202103242359
ISSUE DATE / TIME: 202102150940
ISSUE DATE / TIME: 202102150940
Unit Type and Rh: 9500
Unit Type and Rh: 9500

## 2019-04-28 LAB — TYPE AND SCREEN
ABO/RH(D): O NEG
Antibody Screen: NEGATIVE
Unit division: 0
Unit division: 0

## 2019-04-28 LAB — CBC
HCT: 25.2 % — ABNORMAL LOW (ref 36.0–46.0)
Hemoglobin: 8 g/dL — ABNORMAL LOW (ref 12.0–15.0)
MCH: 31.9 pg (ref 26.0–34.0)
MCHC: 31.7 g/dL (ref 30.0–36.0)
MCV: 100.4 fL — ABNORMAL HIGH (ref 80.0–100.0)
Platelets: 170 10*3/uL (ref 150–400)
RBC: 2.51 MIL/uL — ABNORMAL LOW (ref 3.87–5.11)
RDW: 14 % (ref 11.5–15.5)
WBC: 10.9 10*3/uL — ABNORMAL HIGH (ref 4.0–10.5)
nRBC: 0.2 % (ref 0.0–0.2)

## 2019-04-28 LAB — POCT I-STAT EG7
Acid-base deficit: 1 mmol/L (ref 0.0–2.0)
Bicarbonate: 24 mmol/L (ref 20.0–28.0)
Calcium, Ion: 1.21 mmol/L (ref 1.15–1.40)
HCT: 25 % — ABNORMAL LOW (ref 36.0–46.0)
Hemoglobin: 8.5 g/dL — ABNORMAL LOW (ref 12.0–15.0)
O2 Saturation: 59 %
Patient temperature: 98.5
Potassium: 5.9 mmol/L — ABNORMAL HIGH (ref 3.5–5.1)
Sodium: 132 mmol/L — ABNORMAL LOW (ref 135–145)
TCO2: 25 mmol/L (ref 22–32)
pCO2, Ven: 38.4 mmHg — ABNORMAL LOW (ref 44.0–60.0)
pH, Ven: 7.403 (ref 7.250–7.430)
pO2, Ven: 30 mmHg — CL (ref 32.0–45.0)

## 2019-04-28 LAB — GLUCOSE, CAPILLARY
Glucose-Capillary: 105 mg/dL — ABNORMAL HIGH (ref 70–99)
Glucose-Capillary: 108 mg/dL — ABNORMAL HIGH (ref 70–99)
Glucose-Capillary: 118 mg/dL — ABNORMAL HIGH (ref 70–99)
Glucose-Capillary: 121 mg/dL — ABNORMAL HIGH (ref 70–99)
Glucose-Capillary: 134 mg/dL — ABNORMAL HIGH (ref 70–99)
Glucose-Capillary: 146 mg/dL — ABNORMAL HIGH (ref 70–99)

## 2019-04-28 LAB — BASIC METABOLIC PANEL
Anion gap: 8 (ref 5–15)
BUN: 14 mg/dL (ref 8–23)
CO2: 24 mmol/L (ref 22–32)
Calcium: 8.4 mg/dL — ABNORMAL LOW (ref 8.9–10.3)
Chloride: 101 mmol/L (ref 98–111)
Creatinine, Ser: 0.73 mg/dL (ref 0.44–1.00)
GFR calc Af Amer: >60 mL/min (ref >60)
GFR calc non Af Amer: >60 mL/min (ref >60)
Glucose, Bld: 125 mg/dL — ABNORMAL HIGH (ref 70–99)
Potassium: 4.4 mmol/L (ref 3.5–5.1)
Sodium: 133 mmol/L — ABNORMAL LOW (ref 135–145)

## 2019-04-28 MED ORDER — METOPROLOL TARTRATE 50 MG PO TABS
50.0000 mg | ORAL_TABLET | Freq: Two times a day (BID) | ORAL | Status: DC
  Administered 2019-04-28 – 2019-05-02 (×8): 50 mg via ORAL
  Filled 2019-04-28 (×8): qty 1

## 2019-04-28 MED ORDER — FUROSEMIDE 10 MG/ML IJ SOLN
40.0000 mg | Freq: Once | INTRAMUSCULAR | Status: AC
  Administered 2019-04-28: 40 mg via INTRAVENOUS
  Filled 2019-04-28: qty 4

## 2019-04-28 MED ORDER — METOPROLOL TARTRATE 25 MG PO TABS
25.0000 mg | ORAL_TABLET | Freq: Two times a day (BID) | ORAL | Status: DC
  Administered 2019-04-28: 25 mg via ORAL
  Filled 2019-04-28: qty 1

## 2019-04-28 MED ORDER — POTASSIUM CHLORIDE CRYS ER 20 MEQ PO TBCR
20.0000 meq | EXTENDED_RELEASE_TABLET | ORAL | Status: DC
  Filled 2019-04-28: qty 1

## 2019-04-28 MED ORDER — METOPROLOL TARTRATE 25 MG/10 ML ORAL SUSPENSION: 25.0000 mg | Freq: Two times a day (BID) | ORAL | Status: DC

## 2019-04-28 MED ORDER — APIXABAN 5 MG PO TABS
5.0000 mg | ORAL_TABLET | Freq: Two times a day (BID) | ORAL | Status: DC
  Administered 2019-04-28 – 2019-05-02 (×9): 5 mg via ORAL
  Filled 2019-04-28 (×9): qty 1

## 2019-04-28 MED ORDER — POTASSIUM CHLORIDE 10 MEQ/50ML IV SOLN
10.0000 meq | INTRAVENOUS | Status: AC
  Administered 2019-04-28 (×3): 10 meq via INTRAVENOUS
  Filled 2019-04-28 (×3): qty 50

## 2019-04-28 NOTE — Progress Notes (Signed)
Progress Note  Patient Name: Gabriela Smith Date of Encounter: 04/28/2019  Primary Cardiologist: Minus Breeding, MD   Subjective   79 year old female with a history of coronary artery disease-status post coronary artery bypass grafting.  She developed atrial fibrillation yesterday and has been on IV amiodarone.  She remains in atrial fibrillation with a heart rate in the 120s to 130 range. She denies any chest pain or shortness of breath.  She is sore.   Inpatient Medications    Scheduled Meds: . acetaminophen  1,000 mg Oral Q6H   Or  . acetaminophen (TYLENOL) oral liquid 160 mg/5 mL  1,000 mg Per Tube Q6H  . amiodarone  200 mg Oral Q12H   Followed by  . [START ON 05/04/2019] amiodarone  200 mg Oral Daily  . aspirin EC  81 mg Oral Daily  . B-complex with vitamin C  1 tablet Oral Daily  . bisacodyl  10 mg Oral Daily   Or  . bisacodyl  10 mg Rectal Daily  . bumetanide  1 mg Oral Daily  . Chlorhexidine Gluconate Cloth  6 each Topical Daily  . cholecalciferol  1,000 Units Oral Daily  . docusate sodium  200 mg Oral Daily  . furosemide  40 mg Intravenous Once  . gabapentin  100 mg Oral QHS  . lidocaine  1 patch Transdermal Q24H  . metoprolol tartrate  25 mg Oral BID  . pantoprazole  40 mg Oral Daily  . sodium chloride flush  10-40 mL Intracatheter Q12H  . sodium chloride flush  3 mL Intravenous Q12H  . vitamin B-12  500 mcg Oral Daily   Continuous Infusions: . sodium chloride Stopped (04/28/19 0556)  . sodium chloride    . sodium chloride    . amiodarone 30 mg/hr (04/28/19 0600)  . lactated ringers    . lactated ringers Stopped (04/26/19 0906)  . lactated ringers     PRN Meds: sodium chloride, lactated ringers, metoprolol tartrate, midazolam, ondansetron (ZOFRAN) IV, oxyCODONE, sodium chloride flush, sodium chloride flush, traMADol   Vital Signs    Vitals:   04/28/19 0500 04/28/19 0600 04/28/19 0700 04/28/19 0800  BP: (!) 136/98 128/85 97/69 105/75  Pulse: (!) 126  (!) 117 (!) 124 (!) 125  Resp: 16 (!) 23 17 15   Temp:    98.5 F (36.9 C)  TempSrc:    Oral  SpO2: 99% 97% 97% 98%  Weight: 87.8 kg     Height:        Intake/Output Summary (Last 24 hours) at 04/28/2019 0837 Last data filed at 04/28/2019 0600 Gross per 24 hour  Intake 2043.03 ml  Output 500 ml  Net 1543.03 ml   Last 3 Weights 04/28/2019 04/27/2019 04/26/2019  Weight (lbs) 193 lb 9 oz 192 lb 11.2 oz 192 lb 7.4 oz  Weight (kg) 87.8 kg 87.408 kg 87.3 kg      Telemetry     Afib with HR  120-130 105.   Personally Reviewed  ECG     - Personally Reviewed  Physical Exam   Physical Exam: Blood pressure 105/75, pulse (!) 125, temperature 98.5 F (36.9 C), temperature source Oral, resp. rate 15, height 5\' 3"  (1.6 m), weight 87.8 kg, SpO2 98 %.  GEN:   Elderly female,  nad  HEENT: Normal NECK: No JVD; No carotid bruits LYMPHATICS: No lymphadenopathy CARDIAC: Irreg. Irreg.  RESPIRATORY:  Clear to auscultation without rales, wheezing or rhonchi  ABDOMEN: Soft, non-tender, non-distended MUSCULOSKELETAL:  No edema; No  deformity  SKIN: Warm and dry NEUROLOGIC:  Alert and oriented x 3   Labs    High Sensitivity Troponin:   Recent Labs  Lab 04/15/19 1214 04/15/19 1423 04/15/19 2313 04/16/19 0057 04/16/19 0834  TROPONINIHS 156* 167* 340* 493* 440*      Chemistry Recent Labs  Lab 04/26/19 1544 04/27/19 0330 04/27/19 2009  NA 133* 135 131*  K 3.7 4.3 3.5  CL 102 99 102  CO2 23 25 21*  GLUCOSE 136* 137* 132*  BUN 10 12 14   CREATININE 0.93 0.82 0.76  CALCIUM 8.5* 8.8* 8.5*  GFRNONAA 58* >60 >60  GFRAA >60 >60 >60  ANIONGAP 8 11 8      Hematology Recent Labs  Lab 04/26/19 1544 04/27/19 0330 04/28/19 0439  WBC 9.5 9.8 10.9*  RBC 2.54* 2.40* 2.51*  HGB 8.2* 7.7* 8.0*  HCT 25.3* 24.4* 25.2*  MCV 99.6 101.7* 100.4*  MCH 32.3 32.1 31.9  MCHC 32.4 31.6 31.7  RDW 13.6 14.0 14.0  PLT 161 151 170    BNPNo results for input(s): BNP, PROBNP in the last 168  hours.   DDimer No results for input(s): DDIMER in the last 168 hours.   Radiology    DG Chest Port 1 View  Result Date: 04/28/2019 CLINICAL DATA:  Shortness of breath and chest pain EXAM: PORTABLE CHEST 1 VIEW COMPARISON:  April 27, 2019 FINDINGS: Cordis tip is in the superior vena cava. Apparent Swan-Ganz catheter previously placed from an inferior approach is no longer evident. No pneumothorax. There is consolidation in the left lower lung region with associated atelectasis and pleural effusion, stable. There is stable consolidation in the medial right base. Right lung otherwise is clear. Heart is mildly enlarged with pulmonary vascularity normal. Postoperative changes from coronary artery bypass grafting noted. There is a left atrial appendage clamp. There is aortic atherosclerosis. No adenopathy. No bone lesions. IMPRESSION: Central catheter tip in superior vena cava. No pneumothorax. Persistent airspace consolidation with atelectasis and pleural effusion on the left. Stable consolidation medial right base. No new opacity. Stable cardiac silhouette. Postoperative changes noted. Aortic Atherosclerosis (ICD10-I70.0). Electronically Signed   By: Lowella Grip III M.D.   On: 04/28/2019 07:58   DG Chest Port 1 View  Result Date: 04/27/2019 CLINICAL DATA:  Shortness of breath EXAM: PORTABLE CHEST 1 VIEW COMPARISON:  04/26/2019 FINDINGS: Postsurgical changes are again seen. Right jugular dialysis catheter is again noted and stable. Mediastinal drain and left thoracostomy catheter are again identified and stable. Small left pleural effusion and basilar atelectasis is again seen. No new focal abnormality is noted. IMPRESSION: Stable changes when compare with the prior exam. Electronically Signed   By: Inez Catalina M.D.   On: 04/27/2019 08:55    Cardiac Studies      Patient Profile     79 y.o. female   [name]    1.  CAD :   S/p CABG.    Steady progress.  Still in AFib .   HR is  a bit faster today .   2.  Atrial fib :   She is still on amiodarone.  She is status post left atrial clipping.  If she remains in atrial fibrillation will need to consider Eliquis. We will also need to can set her Plavix that she is allergic to aspirin following her bypass surgery.  3.  Hyperlipidemia: She is intolerant to statins.  Will consider referral to the lipid clinic for PCSK9 inhibitors once she is out of  the hospital.  For questions or updates, please contact [place] Please consult [email or web address] for contact info under        Signed, Mertie Moores, MD  04/28/2019, 8:37 AM

## 2019-04-28 NOTE — Progress Notes (Signed)
T[facility] Evening Rounds  POD #3 s/p CABG Remains in AF rate 130s; hemodyn stable BP (!) 113/100   Pulse (!) 125   Temp 98.7 F (37.1 C) (Oral)   Resp 16   Ht 5\' 3"  (1.6 m)   Wt 87.8 kg   SpO2 100%   BMI 34.29 kg/m  Alert/oriented cta   Intake/Output Summary (Last 24 hours) at 04/28/2019 1832 Last data filed at 04/28/2019 1400 Gross per 24 hour  Intake 1148.01 ml  Output 2150 ml  Net -1001.99 ml    Doing well; continue present management Oshea Percival Z. Orvan Seen, [place]

## 2019-04-28 NOTE — Progress Notes (Addendum)
[facility] DAILY ICU PROGRESS NOTE                   [place]            [place] [address]          [phone]   3 Days Post-Op Procedure(s) (LRB): CORONARY ARTERY BYPASS GRAFTING (CABG) times four using left internal mammary artery and left greater saphenous vein harvested endoscipically. (N/A) Clipping Of Atrial Appendage (N/A)  Total Length of Stay:  LOS: 13 days   Subjective:  Up in chair,  Doing fairly well, continues to have some nausea at times.  Objective: Vital signs in last 24 hours: Temp:  [97.2 F (36.2 C)-98.9 F (37.2 C)] 98.1 F (36.7 C) (02/18 0400) Pulse Rate:  [98-128] 124 (02/18 0700) Cardiac Rhythm: Atrial fibrillation (02/18 0400) Resp:  [15-23] 17 (02/18 0700) BP: (97-136)/(51-98) 97/69 (02/18 0700) SpO2:  [94 %-100 %] 97 % (02/18 0700) Weight:  [87.8 kg] 87.8 kg (02/18 0500)  Filed Weights   04/26/19 0530 04/27/19 0515 04/28/19 0500  Weight: 87.3 kg 87.4 kg 87.8 kg    Weight change: 0.392 kg   Intake/Output from previous day: 02/17 0701 - 02/18 0700 In: 2043 [I.V.:1940; IV Piggyback:103.1] Out: 500 [Urine:500]  Current Meds: Scheduled Meds: . acetaminophen  1,000 mg Oral Q6H   Or  . acetaminophen (TYLENOL) oral liquid 160 mg/5 mL  1,000 mg Per Tube Q6H  . amiodarone  200 mg Oral Q12H   Followed by  . [START ON 05/04/2019] amiodarone  200 mg Oral Daily  . aspirin EC  81 mg Oral Daily  . B-complex with vitamin C  1 tablet Oral Daily  . bisacodyl  10 mg Oral Daily   Or  . bisacodyl  10 mg Rectal Daily  . bumetanide  1 mg Oral Daily  . Chlorhexidine Gluconate Cloth  6 each Topical Daily  . cholecalciferol  1,000 Units Oral Daily  . docusate sodium  200 mg Oral Daily  . furosemide  40 mg Intravenous Once  . gabapentin  100 mg Oral QHS  . lidocaine  1 patch Transdermal Q24H  . metoprolol tartrate  25 mg Oral BID  . pantoprazole  40 mg Oral Daily  . sodium chloride flush  10-40 mL Intracatheter Q12H  . sodium chloride  flush  3 mL Intravenous Q12H  . vitamin B-12  500 mcg Oral Daily   Continuous Infusions: . sodium chloride Stopped (04/28/19 0556)  . sodium chloride    . sodium chloride    . amiodarone 30 mg/hr (04/28/19 0600)  . lactated ringers    . lactated ringers Stopped (04/26/19 0906)  . lactated ringers     PRN Meds:.sodium chloride, lactated ringers, metoprolol tartrate, midazolam, ondansetron (ZOFRAN) IV, oxyCODONE, sodium chloride flush, sodium chloride flush, traMADol  General appearance: alert, cooperative and no distress Heart: regular rate and rhythm Lungs: clear to auscultation bilaterally Abdomen: soft, non-tender; bowel sounds normal; no masses,  no organomegaly Extremities: edema trace Wound: clean and dry EVH sites, aquacel on sternum  Lab Results: CBC: Recent Labs    04/27/19 0330 04/28/19 0439  WBC 9.8 10.9*  HGB 7.7* 8.0*  HCT 24.4* 25.2*  PLT 151 170   BMET:  Recent Labs    04/27/19 0330 04/27/19 2009  NA 135 131*  K 4.3 3.5  CL 99 102  CO2 25 21*  GLUCOSE 137* 132*  BUN 12 14  CREATININE 0.82 0.76  CALCIUM 8.8*  8.5*    CMET: Lab Results  Component Value Date   WBC 10.9 (H) 04/28/2019   HGB 8.0 (L) 04/28/2019   HCT 25.2 (L) 04/28/2019   PLT 170 04/28/2019   GLUCOSE 132 (H) 04/27/2019   CHOL 227 (H) 04/16/2019   TRIG 131 04/16/2019   HDL 59 04/16/2019   LDLDIRECT 193 (H) 10/21/2016   LDLCALC 142 (H) 04/16/2019   ALT 45 (H) 04/15/2019   AST 30 04/15/2019   NA 131 (L) 04/27/2019   K 3.5 04/27/2019   CL 102 04/27/2019   CREATININE 0.76 04/27/2019   BUN 14 04/27/2019   CO2 21 (L) 04/27/2019   TSH 8.256 (H) 04/27/2019   INR 1.5 (H) 04/25/2019   HGBA1C 5.7 (H) 04/24/2019      PT/INR:  Recent Labs    04/25/19 1342  LABPROT 18.1*  INR 1.5*   Radiology: No results found.   Assessment/Plan: S/P Procedure(s) (LRB): CORONARY ARTERY BYPASS GRAFTING (CABG) times four using left internal mammary artery and left greater saphenous vein  harvested endoscipically. (N/A) Clipping Of Atrial Appendage (N/A)  1. CV- PAF, in Sinus Tach this morning- will start oral Amiodarone, increase Lopressor to 25 mg BID, she was on 50 BID prior to admission 2. Pulm- no acute issues, continue IS 3. Renal- per nurse the patient's K was over 7 this morning, however they think this is an error, so results not reported, new BMET was sent results are pending 4. Expected post operative blood loss anemia, stable Hgb at 8.0 5. Thrombocytopenia, improving 6. Hypothyroidism, will need to follow up with PCP 7. Dispo- patient stable, currently in Sinus Tach, but has been flipping into A. Fib off and on all evening, will start oral Amiodarone, increase Lopressor, await repeat BMET results, continue current care     Ellwood Handler, PA-C  04/28/2019 7:41 AM   Agree with above Remains in afib.  Will continue gtt.  Increased BB, and started lasix.  Will defer to cards if dilt or dig should be started.  Will start eliquis today. Looks good otherwise.  Removing CVL Will keep in unit until rate controlled  [place]

## 2019-04-29 DIAGNOSIS — I4819 Other persistent atrial fibrillation: Secondary | ICD-10-CM

## 2019-04-29 LAB — BASIC METABOLIC PANEL
Anion gap: 11 (ref 5–15)
BUN: 16 mg/dL (ref 8–23)
CO2: 25 mmol/L (ref 22–32)
Calcium: 8.9 mg/dL (ref 8.9–10.3)
Chloride: 96 mmol/L — ABNORMAL LOW (ref 98–111)
Creatinine, Ser: 0.82 mg/dL (ref 0.44–1.00)
GFR calc Af Amer: >60 mL/min (ref >60)
GFR calc non Af Amer: >60 mL/min (ref >60)
Glucose, Bld: 146 mg/dL — ABNORMAL HIGH (ref 70–99)
Potassium: 4.2 mmol/L (ref 3.5–5.1)
Sodium: 132 mmol/L — ABNORMAL LOW (ref 135–145)

## 2019-04-29 LAB — GLUCOSE, CAPILLARY
Glucose-Capillary: 145 mg/dL — ABNORMAL HIGH (ref 70–99)
Glucose-Capillary: 156 mg/dL — ABNORMAL HIGH (ref 70–99)
Glucose-Capillary: 121 mg/dL — ABNORMAL HIGH (ref 70–99)
Glucose-Capillary: 110 mg/dL — ABNORMAL HIGH (ref 70–99)
Glucose-Capillary: 148 mg/dL — ABNORMAL HIGH (ref 70–99)
Glucose-Capillary: 102 mg/dL — ABNORMAL HIGH (ref 70–99)

## 2019-04-29 LAB — CBC
HCT: 26.6 % — ABNORMAL LOW (ref 36.0–46.0)
Hemoglobin: 8.5 g/dL — ABNORMAL LOW (ref 12.0–15.0)
MCH: 32 pg (ref 26.0–34.0)
MCHC: 32 g/dL (ref 30.0–36.0)
MCV: 100 fL (ref 80.0–100.0)
Platelets: 244 10*3/uL (ref 150–400)
RBC: 2.66 MIL/uL — ABNORMAL LOW (ref 3.87–5.11)
RDW: 14.3 % (ref 11.5–15.5)
WBC: 9.9 10*3/uL (ref 4.0–10.5)
nRBC: 0.7 % — ABNORMAL HIGH (ref 0.0–0.2)

## 2019-04-29 MED ORDER — DIGOXIN 0.25 MG/ML IJ SOLN
0.2500 mg | Freq: Four times a day (QID) | INTRAMUSCULAR | Status: AC
  Administered 2019-04-29 (×3): 0.25 mg via INTRAVENOUS
  Filled 2019-04-29 (×3): qty 2

## 2019-04-29 MED ORDER — POTASSIUM CHLORIDE CRYS ER 20 MEQ PO TBCR
40.0000 meq | EXTENDED_RELEASE_TABLET | Freq: Once | ORAL | Status: AC
  Administered 2019-04-29: 40 meq via ORAL
  Filled 2019-04-29: qty 2

## 2019-04-29 MED ORDER — FUROSEMIDE 40 MG PO TABS
40.0000 mg | ORAL_TABLET | Freq: Every day | ORAL | Status: DC
  Administered 2019-04-29 – 2019-05-02 (×4): 40 mg via ORAL
  Filled 2019-04-29 (×4): qty 1

## 2019-04-29 MED ORDER — DIGOXIN 125 MCG PO TABS
0.1250 mg | ORAL_TABLET | Freq: Every day | ORAL | Status: DC
  Administered 2019-04-30 – 2019-05-02 (×3): 0.125 mg via ORAL
  Filled 2019-04-29 (×3): qty 1

## 2019-04-29 MED FILL — Electrolyte-R (PH 7.4) Solution: INTRAVENOUS | Qty: 4000 | Status: AC

## 2019-04-29 MED FILL — Calcium Chloride Inj 10%: INTRAVENOUS | Qty: 10 | Status: AC

## 2019-04-29 MED FILL — Sodium Chloride IV Soln 0.9%: INTRAVENOUS | Qty: 2000 | Status: AC

## 2019-04-29 MED FILL — Mannitol IV Soln 20%: INTRAVENOUS | Qty: 500 | Status: AC

## 2019-04-29 MED FILL — Heparin Sodium (Porcine) Inj 1000 Unit/ML: INTRAMUSCULAR | Qty: 10 | Status: AC

## 2019-04-29 MED FILL — Sodium Bicarbonate IV Soln 8.4%: INTRAVENOUS | Qty: 50 | Status: AC

## 2019-04-29 MED FILL — Albumin, Human Inj 5%: INTRAVENOUS | Qty: 250 | Status: AC

## 2019-04-29 NOTE — Progress Notes (Signed)
      [address]       [place] [address]             [phone]                 4 Days Post-Op Procedure(s) (LRB): CORONARY ARTERY BYPASS GRAFTING (CABG) times four using left internal mammary artery and left greater saphenous vein harvested endoscipically. (N/A) Clipping Of Atrial Appendage (N/A)   Events: Remains in afib with RVR  _______________________________________________________________ Vitals: BP 90/61   Pulse (!) 118   Temp 97.9 F (36.6 C) (Oral)   Resp 17   Ht 5\' 3"  (1.6 m)   Wt 86.7 kg   SpO2 94%   BMI 33.86 kg/m   - Neuro: alert NAD  - Cardiovascular: afib  Drips: off amio.      - Pulm: EWOB    ABG    Component Value Date/Time   PHART 7.349 (L) 04/25/2019 2031   PCO2ART 40.8 04/25/2019 2031   PO2ART 103.0 04/25/2019 2031   HCO3 24.0 04/28/2019 0752   TCO2 25 04/28/2019 0752   ACIDBASEDEF 1.0 04/28/2019 0752   O2SAT 59.0 04/28/2019 0752    - Abd: soft - Extremity: trace  .Intake/Output      02/18 0701 - 02/19 0700 02/19 0701 - 02/20 0700   P.O. 1200    I.V. (mL/kg) 336 (3.9)    IV Piggyback     Total Intake(mL/kg) 1536 (17.7)    Urine (mL/kg/hr) 2100 (1)    Stool 0    Total Output 2100    Net -564         Stool Occurrence 6 x       _______________________________________________________________ Labs: CBC Latest Ref Rng & Units 04/29/2019 04/28/2019 04/28/2019  WBC 4.0 - 10.5 K/uL 9.9 - 10.9(H)  Hemoglobin 12.0 - 15.0 g/dL 8.5(L) 8.5(L) 8.0(L)  Hematocrit 36.0 - 46.0 % 26.6(L) 25.0(L) 25.2(L)  Platelets 150 - 400 K/uL 244 - 170   CMP Latest Ref Rng & Units 04/29/2019 04/28/2019 04/28/2019  Glucose 70 - 99 mg/dL 146(H) 125(H) -  BUN 8 - 23 mg/dL 16 14 -  Creatinine 0.44 - 1.00 mg/dL 0.82 0.73 -  Sodium 135 - 145 mmol/L 132(L) 133(L) 132(L)  Potassium 3.5 - 5.1 mmol/L 4.2 4.4 5.9(H)  Chloride 98 - 111 mmol/L 96(L) 101 -  CO2 22 - 32 mmol/L 25 24 -  Calcium 8.9 - 10.3 mg/dL 8.9 8.4(L) -  Total Protein 6.5 -  8.1 g/dL - - -  Total Bilirubin 0.3 - 1.2 mg/dL - - -  Alkaline Phos 38 - 126 U/L - - -  AST 15 - 41 U/L - - -  ALT 0 - 44 U/L - - -    CXR: pending  _______________________________________________________________  Assessment and Plan: POD 4 s/p CABG atriclip  Neuro: pain controlled  CV: on aspirin.  Increased BB yesterday.  Soft blood pressure so will not increase.  On PO amio now.  Will likely need something else for rate control.  Cardiology on board.  On eliquis for afib Pulm: continue pulm toilet Renal: diuresed well.  Will continue gentle diuresis GI: on diet Heme: stable ID: [email or web address] Endo: on SSI Dispo: continue ICU care while HR so high  Melodie Bouillon, MD 04/29/2019 7:36 AM

## 2019-04-29 NOTE — Plan of Care (Signed)
[name] is s/p CABG. MAP has been wnl after having metoprolol dosage increased. Afib has continued this shift. Nausea also noted this shift without vomiting. Anti-emetic given x 2 with little effect noted. [name] was able to sip on fluids and eat crackers to help get her through the night. She has been restless throughout the shift due to the nausea, but is hoping to eat more this AM since she has had a decreased appetite the past 3 days and believes taking meds on an empty stomach might be the cause. Day shift RN given report and updated on POC.   Problem: Education: Goal: Ability to demonstrate management of disease process will improve Outcome: Progressing Goal: Ability to verbalize understanding of medication therapies will improve Outcome: Progressing   Problem: Activity: Goal: Capacity to carry out activities will improve Outcome: Progressing   Problem: Education: Goal: Knowledge of General Education information will improve Description: Including pain rating scale, medication(s)/side effects and non-pharmacologic comfort measures Outcome: Progressing   Problem: Activity: Goal: Risk for activity intolerance will decrease Outcome: Progressing   Problem: Coping: Goal: Level of anxiety will decrease Outcome: Not Progressing

## 2019-04-29 NOTE — Progress Notes (Signed)
Progress Note  Patient Name: Gabriela Smith Date of Encounter: 04/29/2019  Primary Cardiologist: Minus Breeding, MD   Subjective   79 year old female with a history of coronary artery disease-status post coronary artery bypass grafting.  She developed atrial fibrillation yesterday and has been on IV amiodarone.      She remains in rapid atrial fibrillation.  She is on metoprolol 50 mg twice a day as well as amiodarone 2 mg twice a day.  We will add digoxin.  We will load her with IV digoxin today and start oral digoxin tomorrow.  Inpatient Medications    Scheduled Meds: . acetaminophen  1,000 mg Oral Q6H   Or  . acetaminophen (TYLENOL) oral liquid 160 mg/5 mL  1,000 mg Per Tube Q6H  . amiodarone  200 mg Oral Q12H   Followed by  . [START ON 05/04/2019] amiodarone  200 mg Oral Daily  . apixaban  5 mg Oral BID  . aspirin EC  81 mg Oral Daily  . B-complex with vitamin C  1 tablet Oral Daily  . bisacodyl  10 mg Oral Daily   Or  . bisacodyl  10 mg Rectal Daily  . bumetanide  1 mg Oral Daily  . Chlorhexidine Gluconate Cloth  6 each Topical Daily  . cholecalciferol  1,000 Units Oral Daily  . digoxin  0.25 mg Intravenous Q6H  . [START ON 04/30/2019] digoxin  0.125 mg Oral Daily  . docusate sodium  200 mg Oral Daily  . furosemide  40 mg Oral Daily  . gabapentin  100 mg Oral QHS  . lidocaine  1 patch Transdermal Q24H  . metoprolol tartrate  50 mg Oral BID  . pantoprazole  40 mg Oral Daily  . sodium chloride flush  10-40 mL Intracatheter Q12H  . sodium chloride flush  3 mL Intravenous Q12H  . vitamin B-12  500 mcg Oral Daily   Continuous Infusions: . sodium chloride Stopped (04/28/19 1847)  . sodium chloride    . sodium chloride    . amiodarone Stopped (04/28/19 1902)  . lactated ringers    . lactated ringers Stopped (04/26/19 0906)  . lactated ringers     PRN Meds: sodium chloride, lactated ringers, metoprolol tartrate, midazolam, ondansetron (ZOFRAN) IV, oxyCODONE, sodium  chloride flush, sodium chloride flush, traMADol   Vital Signs    Vitals:   04/29/19 0600 04/29/19 0700 04/29/19 0757 04/29/19 0850  BP: 107/74 94/63  105/76  Pulse: (!) 132 (!) 141  (!) 127  Resp: (!) 24 (!) 30    Temp:   98 F (36.7 C)   TempSrc:   Oral   SpO2: 94% 92%    Weight:      Height:        Intake/Output Summary (Last 24 hours) at 04/29/2019 0854 Last data filed at 04/29/2019 0600 Gross per 24 hour  Intake 1493.12 ml  Output 2100 ml  Net -606.88 ml   Last 3 Weights 04/29/2019 04/28/2019 04/27/2019  Weight (lbs) 191 lb 2.2 oz 193 lb 9 oz 192 lb 11.2 oz  Weight (kg) 86.7 kg 87.8 kg 87.408 kg      Telemetry     Afib with HR   120-130s.   Personally Reviewed  ECG     - Personally Reviewed  Physical Exam    Physical Exam: Blood pressure 105/76, pulse (!) 127, temperature 98 F (36.7 C), temperature source Oral, resp. rate (!) 30, height 5\' 3"  (1.6 m), weight 86.7 kg, SpO2 92 %.  GEN:  Elderly female,  moderately obese,  NAD   HEENT: Normal NECK: No JVD; No carotid bruits LYMPHATICS: No lymphadenopathy CARDIAC:  Irreg. Irreg   Tachy  RESPIRATORY:  Clear to auscultation without rales, wheezing or rhonchi  ABDOMEN: Soft, non-tender, non-distended MUSCULOSKELETAL:  No edema; No deformity  SKIN: Warm and dry NEUROLOGIC:  Alert and oriented x 3   Labs    High Sensitivity Troponin:   Recent Labs  Lab 04/15/19 1214 04/15/19 1423 04/15/19 2313 04/16/19 0057 04/16/19 0834  TROPONINIHS 156* 167* 340* 493* 440*      Chemistry Recent Labs  Lab 04/27/19 2009 04/27/19 2009 04/28/19 0752 04/28/19 0832 04/29/19 0246  NA 131*   < > 132* 133* 132*  K 3.5   < > 5.9* 4.4 4.2  CL 102  --   --  101 96*  CO2 21*  --   --  24 25  GLUCOSE 132*  --   --  125* 146*  BUN 14  --   --  14 16  CREATININE 0.76  --   --  0.73 0.82  CALCIUM 8.5*  --   --  8.4* 8.9  GFRNONAA >60  --   --  >60 >60  GFRAA >60  --   --  >60 >60  ANIONGAP 8  --   --  8 11   < > =  values in this interval not displayed.     Hematology Recent Labs  Lab 04/27/19 0330 04/27/19 0330 04/28/19 0439 04/28/19 0752 04/29/19 0246  WBC 9.8  --  10.9*  --  9.9  RBC 2.40*  --  2.51*  --  2.66*  HGB 7.7*   < > 8.0* 8.5* 8.5*  HCT 24.4*   < > 25.2* 25.0* 26.6*  MCV 101.7*  --  100.4*  --  100.0  MCH 32.1  --  31.9  --  32.0  MCHC 31.6  --  31.7  --  32.0  RDW 14.0  --  14.0  --  14.3  PLT 151  --  170  --  244   < > = values in this interval not displayed.    BNPNo results for input(s): BNP, PROBNP in the last 168 hours.   DDimer No results for input(s): DDIMER in the last 168 hours.   Radiology    DG Chest Port 1 View  Result Date: 04/28/2019 CLINICAL DATA:  Shortness of breath and chest pain EXAM: PORTABLE CHEST 1 VIEW COMPARISON:  April 27, 2019 FINDINGS: Cordis tip is in the superior vena cava. Apparent Swan-Ganz catheter previously placed from an inferior approach is no longer evident. No pneumothorax. There is consolidation in the left lower lung region with associated atelectasis and pleural effusion, stable. There is stable consolidation in the medial right base. Right lung otherwise is clear. Heart is mildly enlarged with pulmonary vascularity normal. Postoperative changes from coronary artery bypass grafting noted. There is a left atrial appendage clamp. There is aortic atherosclerosis. No adenopathy. No bone lesions. IMPRESSION: Central catheter tip in superior vena cava. No pneumothorax. Persistent airspace consolidation with atelectasis and pleural effusion on the left. Stable consolidation medial right base. No new opacity. Stable cardiac silhouette. Postoperative changes noted. Aortic Atherosclerosis (ICD10-I70.0). Electronically Signed   By: Lowella Grip III M.D.   On: 04/28/2019 07:58    Cardiac Studies      Patient Profile     79 y.o. female   [place]  1.  CAD :   S/p CABG.    Steady progress.  Still in AF.   No angina   Is  tolerating ASA. She has an allergy to ASA listed but seems to be tolerating it. Perhaps she is intolerant to high dose asa I will delete from allergy list   2.  Atrial fib :    On amiodarone,  Metoprolol 50 mg po BID has been added.  Will add Digoxin  - load with IV dig today and then continue Digoxin 0.125 mg a day orally tomorrow ( lower dose since she is on amiodarone )  Is on eliquis 5 mg BID   3.  Hyperlipidemia:  Consider PCSK-9 inhibitor as OP    For questions or updates, please contact [place] Please consult [email or web address] for contact info under        Signed, Mertie Moores, MD  04/29/2019, 8:54 AM

## 2019-04-29 NOTE — Progress Notes (Signed)
      [address]       [place] [address]             [phone]      POD # 4 CABG, LAA clip  BP (!) 93/52   Pulse 95   Temp 97.7 F (36.5 C) (Oral)   Resp 16   Ht 5\' 3"  (1.6 m)   Wt 86.7 kg   SpO2 94%   BMI 33.86 kg/m  A fib rate 95-120 RA 98% sat   Intake/Output Summary (Last 24 hours) at 04/29/2019 1703 Last data filed at 04/29/2019 1600 Gross per 24 hour  Intake 1844.95 ml  Output 1200 ml  Net 644.95 ml   CBG well controlled  Remo Lipps C. Roxan Hockey, MD [facility] [phone]

## 2019-04-30 ENCOUNTER — Inpatient Hospital Stay (HOSPITAL_COMMUNITY): Payer: Medicare Other

## 2019-04-30 LAB — GLUCOSE, CAPILLARY
Glucose-Capillary: 94 mg/dL (ref 70–99)
Glucose-Capillary: 140 mg/dL — ABNORMAL HIGH (ref 70–99)
Glucose-Capillary: 130 mg/dL — ABNORMAL HIGH (ref 70–99)
Glucose-Capillary: 107 mg/dL — ABNORMAL HIGH (ref 70–99)
Glucose-Capillary: 99 mg/dL (ref 70–99)

## 2019-04-30 MED ORDER — MENTHOL 3 MG MT LOZG
1.0000 | LOZENGE | OROMUCOSAL | Status: DC | PRN
  Administered 2019-04-30: 04:00:00 3 mg via ORAL
  Filled 2019-04-30: qty 9

## 2019-04-30 NOTE — Progress Notes (Signed)
5 Days Post-Op Procedure(s) (LRB): CORONARY ARTERY BYPASS GRAFTING (CABG) times four using left internal mammary artery and left greater saphenous vein harvested endoscipically. (N/A) Clipping Of Atrial Appendage (N/A) Subjective: C/o feeling short of breath, feels like she needs to take a deep breath but can't  Objective: Vital signs in last 24 hours: Temp:  [97.7 F (36.5 C)-98.3 F (36.8 C)] 98.2 F (36.8 C) (02/20 0400) Pulse Rate:  [83-129] 97 (02/20 0800) Cardiac Rhythm: (P) Atrial fibrillation (02/20 0800) Resp:  [15-27] 19 (02/20 0800) BP: (93-132)/(46-87) 105/69 (02/20 0700) SpO2:  [80 %-98 %] 97 % (02/20 0800) Weight:  [83.2 kg] 83.2 kg (02/20 0500)  Hemodynamic parameters for last 24 hours:    Intake/Output from previous day: 02/19 0701 - 02/20 0700 In: 1030 [P.O.:1000; I.V.:30] Out: 1700 [Urine:1700] Intake/Output this shift: Total I/O In: 240 [P.O.:240] Out: -   General appearance: alert, cooperative and no distress Neurologic: intact Heart: irregularly irregular rhythm Lungs: diminished breath sounds bibasilar Abdomen: normal findings: soft, non-tender  Lab Results: Recent Labs    04/28/19 0439 04/28/19 0439 04/28/19 0752 04/29/19 0246  WBC 10.9*  --   --  9.9  HGB 8.0*   < > 8.5* 8.5*  HCT 25.2*   < > 25.0* 26.6*  PLT 170  --   --  244   < > = values in this interval not displayed.   BMET:  Recent Labs    04/28/19 0832 04/29/19 0246  NA 133* 132*  K 4.4 4.2  CL 101 96*  CO2 24 25  GLUCOSE 125* 146*  BUN 14 16  CREATININE 0.73 0.82  CALCIUM 8.4* 8.9    PT/INR: No results for input(s): LABPROT, INR in the last 72 hours. ABG    Component Value Date/Time   PHART 7.349 (L) 04/25/2019 2031   HCO3 24.0 04/28/2019 0752   TCO2 25 04/28/2019 0752   ACIDBASEDEF 1.0 04/28/2019 0752   O2SAT 59.0 04/28/2019 0752   CBG (last 3)  Recent Labs    04/29/19 2328 04/30/19 0331 04/30/19 0752  GLUCAP 102* 94 140*    Assessment/Plan: S/P  Procedure(s) (LRB): CORONARY ARTERY BYPASS GRAFTING (CABG) times four using left internal mammary artery and left greater saphenous vein harvested endoscipically. (N/A) Clipping Of Atrial Appendage (N/A) -=  CV- still in atrial fib, but reate better controlled  Continue current medications RESP- c/o SOB, nothing remarkable on exam, will check a CXR RENAL- 600 ml negative last 24 hours ENDO- CBG well controlled Continue to mobilize   LOS: 15 days    Melrose Nakayama 04/30/2019

## 2019-04-30 NOTE — Progress Notes (Signed)
Progress Note  Patient Name: Gabriela Smith Date of Encounter: 04/30/2019  Primary Cardiologist: Minus Breeding, MD   Subjective   Sitting comfortably in chair.  Smiling.  Has walked around today.  Mild shortness of breath.  Atrial fibrillation under better rate control.  Inpatient Medications    Scheduled Meds: . acetaminophen  1,000 mg Oral Q6H   Or  . acetaminophen (TYLENOL) oral liquid 160 mg/5 mL  1,000 mg Per Tube Q6H  . amiodarone  200 mg Oral Q12H   Followed by  . [START ON 05/04/2019] amiodarone  200 mg Oral Daily  . apixaban  5 mg Oral BID  . aspirin EC  81 mg Oral Daily  . B-complex with vitamin C  1 tablet Oral Daily  . bisacodyl  10 mg Oral Daily   Or  . bisacodyl  10 mg Rectal Daily  . bumetanide  1 mg Oral Daily  . Chlorhexidine Gluconate Cloth  6 each Topical Daily  . cholecalciferol  1,000 Units Oral Daily  . digoxin  0.125 mg Oral Daily  . docusate sodium  200 mg Oral Daily  . furosemide  40 mg Oral Daily  . gabapentin  100 mg Oral QHS  . lidocaine  1 patch Transdermal Q24H  . metoprolol tartrate  50 mg Oral BID  . pantoprazole  40 mg Oral Daily  . sodium chloride flush  10-40 mL Intracatheter Q12H  . sodium chloride flush  3 mL Intravenous Q12H  . vitamin B-12  500 mcg Oral Daily   Continuous Infusions: . sodium chloride Stopped (04/28/19 1847)  . sodium chloride Stopped (04/29/19 0700)  . sodium chloride Stopped (04/29/19 0700)  . amiodarone Stopped (04/28/19 1902)  . lactated ringers Stopped (04/29/19 0700)  . lactated ringers Stopped (04/26/19 0906)  . lactated ringers Stopped (04/29/19 0700)   PRN Meds: sodium chloride, lactated ringers, menthol-cetylpyridinium, metoprolol tartrate, midazolam, ondansetron (ZOFRAN) IV, oxyCODONE, sodium chloride flush, sodium chloride flush, traMADol   Vital Signs    Vitals:   04/30/19 0500 04/30/19 0600 04/30/19 0700 04/30/19 0800  BP: (!) 103/56 121/62 105/69   Pulse: 97 (!) 104 (!) 104 97  Resp: 18  16 18 19   Temp:      TempSrc:      SpO2: 94% 96% 97% 97%  Weight: 83.2 kg     Height:        Intake/Output Summary (Last 24 hours) at 04/30/2019 0933 Last data filed at 04/30/2019 0800 Gross per 24 hour  Intake 1020 ml  Output 1700 ml  Net -680 ml   Last 3 Weights 04/30/2019 04/29/2019 04/28/2019  Weight (lbs) 183 lb 6.8 oz 191 lb 2.2 oz 193 lb 9 oz  Weight (kg) 83.2 kg 86.7 kg 87.8 kg      Telemetry    Heart rate currently in the 90s.  With walking, does approach 120.- Personally Reviewed  ECG    No new- Personally Reviewed  Physical Exam   GEN: No acute distress.   Neck: No JVD Cardiac: IRRR, 1/6 systolic murmur, no rubs, or gallops.  Postop changes Respiratory: Clear to auscultation bilaterally. GI: Soft, nontender, non-distended  MS: No edema; No deformity. Neuro:  Nonfocal  Psych: Normal affect   Labs    High Sensitivity Troponin:   Recent Labs  Lab 04/15/19 1214 04/15/19 1423 04/15/19 2313 04/16/19 0057 04/16/19 0834  TROPONINIHS 156* 167* 340* 493* 440*      Chemistry Recent Labs  Lab 04/27/19 2009 04/27/19 2009 04/28/19  [id number] 04/28/19 0832 04/29/19 0246  NA 131*   < > 132* 133* 132*  K 3.5   < > 5.9* 4.4 4.2  CL 102  --   --  101 96*  CO2 21*  --   --  24 25  GLUCOSE 132*  --   --  125* 146*  BUN 14  --   --  14 16  CREATININE 0.76  --   --  0.73 0.82  CALCIUM 8.5*  --   --  8.4* 8.9  GFRNONAA >60  --   --  >60 >60  GFRAA >60  --   --  >60 >60  ANIONGAP 8  --   --  8 11   < > = values in this interval not displayed.     Hematology Recent Labs  Lab 04/27/19 0330 04/27/19 0330 04/28/19 0439 04/28/19 0752 04/29/19 0246  WBC 9.8  --  10.9*  --  9.9  RBC 2.40*  --  2.51*  --  2.66*  HGB 7.7*   < > 8.0* 8.5* 8.5*  HCT 24.4*   < > 25.2* 25.0* 26.6*  MCV 101.7*  --  100.4*  --  100.0  MCH 32.1  --  31.9  --  32.0  MCHC 31.6  --  31.7  --  32.0  RDW 14.0  --  14.0  --  14.3  PLT 151  --  170  --  244   < > = values in this interval  not displayed.    BNPNo results for input(s): BNP, PROBNP in the last 168 hours.   DDimer No results for input(s): DDIMER in the last 168 hours.   Radiology    No results found.  Cardiac Studies   Echo this admission-EF 65%, possible mild to moderate aortic stenosis.  Patient Profile     79 y.o. female with coronary disease post CABG left atrial appendage clipping with persistent atrial fibrillation  Assessment & Plan    Persistent atrial fibrillation -Continue with good rate control.  Medications reviewed as above.  Metoprolol, digoxin, amiodarone p.o.  No changes made.  Sitting in chair, heart rate in the 90s.  CAD post CABG -No anginal symptoms.  Ambulating well.  Mild shortness of breath to be expected.  Chronic anticoagulation -On Eliquis.  No signs of bleeding.  Also has left atrial appendage clipping noted.  On aspirin as well.  81 mg.  Mild aortic stenosis -On cardiac catheterization LV systolic pressure was [id number], aortic pressure was 158, mild gradient.  On echocardiogram, mild gradient was also noted.  There is some suspicion however for low output which may underestimate aortic stenosis on echocardiogram.  For now continue with clinical monitoring.      For questions or updates, please contact [place] Please consult [email or web address] for contact info under        Signed, Candee Furbish, MD  04/30/2019, 9:33 AM

## 2019-04-30 NOTE — Progress Notes (Signed)
      [address]       [place] [address]             [phone]      Still subjectively short of breath  She is up in chair, not in any distress, or with any sign of increased WOB  Lungs still decreased BS at left base but otherwise clear  CXR showed a light improvement in LLL atelectasis, left effusion although still there to some degree. No sign of pulmonary edema  Continue current care  Remo Lipps C. Roxan Hockey, MD [facility] [phone]

## 2019-05-01 ENCOUNTER — Inpatient Hospital Stay (HOSPITAL_COMMUNITY): Payer: Medicare Other

## 2019-05-01 LAB — CBC
HCT: 25.6 % — ABNORMAL LOW (ref 36.0–46.0)
Hemoglobin: 8.3 g/dL — ABNORMAL LOW (ref 12.0–15.0)
MCH: 31.9 pg (ref 26.0–34.0)
MCHC: 32.4 g/dL (ref 30.0–36.0)
MCV: 98.5 fL (ref 80.0–100.0)
Platelets: 263 10*3/uL (ref 150–400)
RBC: 2.6 MIL/uL — ABNORMAL LOW (ref 3.87–5.11)
RDW: 14.3 % (ref 11.5–15.5)
WBC: 7.4 10*3/uL (ref 4.0–10.5)
nRBC: 1.1 % — ABNORMAL HIGH (ref 0.0–0.2)

## 2019-05-01 LAB — BASIC METABOLIC PANEL
Anion gap: 9 (ref 5–15)
BUN: 17 mg/dL (ref 8–23)
CO2: 25 mmol/L (ref 22–32)
Calcium: 8.4 mg/dL — ABNORMAL LOW (ref 8.9–10.3)
Chloride: 99 mmol/L (ref 98–111)
Creatinine, Ser: 0.91 mg/dL (ref 0.44–1.00)
GFR calc Af Amer: >60 mL/min (ref >60)
GFR calc non Af Amer: 60 mL/min — ABNORMAL LOW (ref >60)
Glucose, Bld: 91 mg/dL (ref 70–99)
Potassium: 3.4 mmol/L — ABNORMAL LOW (ref 3.5–5.1)
Sodium: 133 mmol/L — ABNORMAL LOW (ref 135–145)

## 2019-05-01 LAB — GLUCOSE, CAPILLARY
Glucose-Capillary: 93 mg/dL (ref 70–99)
Glucose-Capillary: 102 mg/dL — ABNORMAL HIGH (ref 70–99)
Glucose-Capillary: 122 mg/dL — ABNORMAL HIGH (ref 70–99)
Glucose-Capillary: 96 mg/dL (ref 70–99)

## 2019-05-01 MED ORDER — POTASSIUM CHLORIDE CRYS ER 20 MEQ PO TBCR
20.0000 meq | EXTENDED_RELEASE_TABLET | ORAL | Status: AC
  Administered 2019-05-01 (×3): 20 meq via ORAL
  Filled 2019-05-01 (×2): qty 1

## 2019-05-01 MED ORDER — MAGNESIUM OXIDE 400 (241.3 MG) MG PO TABS
400.0000 mg | ORAL_TABLET | Freq: Every day | ORAL | Status: DC
  Administered 2019-05-01: 400 mg via ORAL
  Filled 2019-05-01: qty 1

## 2019-05-01 MED ORDER — SODIUM CHLORIDE 0.9 % IV SOLN: 250.0000 mL | INTRAVENOUS | Status: DC | PRN

## 2019-05-01 MED ORDER — POTASSIUM CHLORIDE ER 10 MEQ PO TBCR
20.0000 meq | EXTENDED_RELEASE_TABLET | Freq: Every day | ORAL | Status: DC
  Administered 2019-05-02: 20 meq via ORAL
  Filled 2019-05-01 (×2): qty 2

## 2019-05-01 MED ORDER — ~~LOC~~ CARDIAC SURGERY, PATIENT & FAMILY EDUCATION: Freq: Once | Status: AC

## 2019-05-01 MED ORDER — MAGNESIUM HYDROXIDE 400 MG/5ML PO SUSP: 30.0000 mL | Freq: Every day | ORAL | Status: DC | PRN

## 2019-05-01 MED ORDER — SODIUM CHLORIDE 0.9% FLUSH
3.0000 mL | Freq: Two times a day (BID) | INTRAVENOUS | Status: DC
  Administered 2019-05-01 – 2019-05-02 (×2): 3 mL via INTRAVENOUS

## 2019-05-01 MED ORDER — SODIUM CHLORIDE 0.9% FLUSH: 3.0000 mL | INTRAVENOUS | Status: DC | PRN

## 2019-05-01 NOTE — Progress Notes (Signed)
6 Days Post-Op Procedure(s) (LRB): CORONARY ARTERY BYPASS GRAFTING (CABG) times four using left internal mammary artery and left greater saphenous vein harvested endoscipically. (N/A) Clipping Of Atrial Appendage (N/A) Subjective: No complaints this AM, feels much better  Objective: Vital signs in last 24 hours: Temp:  [97.7 F (36.5 C)-98.6 F (37 C)] 98.5 F (36.9 C) (02/21 0400) Pulse Rate:  [68-107] 107 (02/21 0800) Cardiac Rhythm: Atrial fibrillation (02/21 0800) Resp:  [14-26] 20 (02/21 0800) BP: (87-123)/(47-80) 111/55 (02/21 0800) SpO2:  [90 %-100 %] 97 % (02/21 0800) Weight:  [84.6 kg] 84.6 kg (02/21 0400)  Hemodynamic parameters for last 24 hours:    Intake/Output from previous day: 02/20 0701 - 02/21 0700 In: 960 [P.O.:960] Out: 1250 [Urine:1250] Intake/Output this shift: Total I/O In: 240 [P.O.:240] Out: -   General appearance: alert, cooperative and no distress Neurologic: intact Heart: irregularly irregular rhythm Lungs: clear to auscultation bilaterally Abdomen: normal findings: soft, non-tender  Lab Results: Recent Labs    04/29/19 0246 05/01/19 0111  WBC 9.9 7.4  HGB 8.5* 8.3*  HCT 26.6* 25.6*  PLT 244 263   BMET:  Recent Labs    04/29/19 0246 05/01/19 0111  NA 132* 133*  K 4.2 3.4*  CL 96* 99  CO2 25 25  GLUCOSE 146* 91  BUN 16 17  CREATININE 0.82 0.91  CALCIUM 8.9 8.4*    PT/INR: No results for input(s): LABPROT, INR in the last 72 hours. ABG    Component Value Date/Time   PHART 7.349 (L) 04/25/2019 2031   HCO3 24.0 04/28/2019 0752   TCO2 25 04/28/2019 0752   ACIDBASEDEF 1.0 04/28/2019 0752   O2SAT 59.0 04/28/2019 0752   CBG (last 3)  Recent Labs    04/30/19 1641 04/30/19 2129 05/01/19 0706  GLUCAP 107* 99 93    Assessment/Plan: S/P Procedure(s) (LRB): CORONARY ARTERY BYPASS GRAFTING (CABG) times four using left internal mammary artery and left greater saphenous vein harvested endoscipically. (N/A) Clipping Of  Atrial Appendage (N/A) Plan for transfer to step-down: see transfer orders  Doing well CV- in rate controlled atrial fib, on beta blocker and amiodarone  Baby aspirin + Eliquis RESP- CXR looks good with minimal left effusion, continue IS RENAL- creatinine normal, supplement K ENDO- CBG have been well controlled Continue cardiac rehab   LOS: 16 days    Melrose Nakayama 05/01/2019

## 2019-05-01 NOTE — Progress Notes (Signed)
Progress Note  Patient Name: Gabriela Smith Date of Encounter: 05/01/2019  Primary Cardiologist: Minus Breeding, MD   Subjective   No significant points of shortness of breath this morning.  Feeling better.  Atrial fibrillation under good rate control.  No bleeding on anticoagulation and aspirin.  Inpatient Medications    Scheduled Meds: . amiodarone  200 mg Oral Q12H   Followed by  . [START ON 05/04/2019] amiodarone  200 mg Oral Daily  . apixaban  5 mg Oral BID  . aspirin EC  81 mg Oral Daily  . B-complex with vitamin C  1 tablet Oral Daily  . bisacodyl  10 mg Oral Daily   Or  . bisacodyl  10 mg Rectal Daily  . bumetanide  1 mg Oral Daily  . Chlorhexidine Gluconate Cloth  6 each Topical Daily  . cholecalciferol  1,000 Units Oral Daily  . digoxin  0.125 mg Oral Daily  . docusate sodium  200 mg Oral Daily  . furosemide  40 mg Oral Daily  . gabapentin  100 mg Oral QHS  . lidocaine  1 patch Transdermal Q24H  . metoprolol tartrate  50 mg Oral BID  . pantoprazole  40 mg Oral Daily  . [START ON 05/02/2019] potassium chloride  20 mEq Oral Daily  . potassium chloride  20 mEq Oral Q4H  . sodium chloride flush  10-40 mL Intracatheter Q12H  . sodium chloride flush  3 mL Intravenous Q12H  . vitamin B-12  500 mcg Oral Daily   Continuous Infusions: . sodium chloride Stopped (04/28/19 1847)  . sodium chloride Stopped (04/29/19 0700)  . sodium chloride Stopped (04/29/19 0700)  . amiodarone Stopped (04/28/19 1902)  . lactated ringers Stopped (04/29/19 0700)  . lactated ringers Stopped (04/26/19 0906)  . lactated ringers Stopped (04/29/19 0700)   PRN Meds: sodium chloride, lactated ringers, menthol-cetylpyridinium, metoprolol tartrate, midazolam, ondansetron (ZOFRAN) IV, oxyCODONE, sodium chloride flush, sodium chloride flush, traMADol   Vital Signs    Vitals:   05/01/19 0500 05/01/19 0600 05/01/19 0700 05/01/19 0800  BP: 105/72 (!) 96/59  (!) 111/55  Pulse:  93  (!) 107   Resp: 18 20 (!) 26 20  Temp:      TempSrc:    Oral  SpO2:  95%  97%  Weight:      Height:        Intake/Output Summary (Last 24 hours) at 05/01/2019 0854 Last data filed at 05/01/2019 0800 Gross per 24 hour  Intake 960 ml  Output 1050 ml  Net -90 ml   Last 3 Weights 05/01/2019 04/30/2019 04/29/2019  Weight (lbs) 186 lb 8.2 oz 183 lb 6.8 oz 191 lb 2.2 oz  Weight (kg) 84.6 kg 83.2 kg 86.7 kg      Telemetry    Heart rates currently 80s to 100 atrial fibrillation.- Personally Reviewed  ECG    No new- Personally Reviewed  Physical Exam  GEN: Well nourished, well developed, in no acute distress  HEENT: normal  Neck: no JVD, carotid bruits, or masses Cardiac: Irregularly irregular; 1/6 systolic murmur, no rubs, or gallops,no edema postop changes noted Respiratory:  clear to auscultation bilaterally, normal work of breathing GI: soft, nontender, nondistended, + BS MS: no deformity or atrophy  Skin: warm and dry, no rash Neuro:  Alert and Oriented x 3, Strength and sensation are intact Psych: euthymic mood, full affect   Labs    High Sensitivity Troponin:   Recent Labs  Lab 04/15/19 1214 04/15/19  1423 04/15/19 2313 04/16/19 0057 04/16/19 0834  TROPONINIHS 156* 167* 340* 493* 440*      Chemistry Recent Labs  Lab 04/28/19 0832 04/29/19 0246 05/01/19 0111  NA 133* 132* 133*  K 4.4 4.2 3.4*  CL 101 96* 99  CO2 24 25 25   GLUCOSE 125* 146* 91  BUN 14 16 17   CREATININE 0.73 0.82 0.91  CALCIUM 8.4* 8.9 8.4*  GFRNONAA >60 >60 60*  GFRAA >60 >60 >60  ANIONGAP 8 11 9      Hematology Recent Labs  Lab 04/28/19 0439 04/28/19 0439 04/28/19 0752 04/29/19 0246 05/01/19 0111  WBC 10.9*  --   --  9.9 7.4  RBC 2.51*  --   --  2.66* 2.60*  HGB 8.0*   < > 8.5* 8.5* 8.3*  HCT 25.2*   < > 25.0* 26.6* 25.6*  MCV 100.4*  --   --  100.0 98.5  MCH 31.9  --   --  32.0 31.9  MCHC 31.7  --   --  32.0 32.4  RDW 14.0  --   --  14.3 14.3  PLT 170  --   --  244 263   < > =  values in this interval not displayed.    BNPNo results for input(s): BNP, PROBNP in the last 168 hours.   DDimer No results for input(s): DDIMER in the last 168 hours.   Radiology    DG Chest 2 View  Result Date: 05/01/2019 CLINICAL DATA:  Post CABG EXAM: CHEST - 2 VIEW COMPARISON:  Chest radiograph from one day prior. FINDINGS: Intact sternotomy wires. Stable cardiomediastinal silhouette with mild cardiomegaly. No pneumothorax. Stable small bilateral pleural effusions, left greater than right. No pulmonary edema. Similar bibasilar atelectasis, left greater than right. IMPRESSION: 1. Small bilateral pleural effusions, left greater than right, stable. 2. Similar right basilar atelectasis. 3. Stable mild cardiomegaly without overt pulmonary edema. Electronically Signed   By: Ilona Sorrel M.D.   On: 05/01/2019 05:43   DG Chest Port 1 View  Result Date: 04/30/2019 CLINICAL DATA:  Status post CABG EXAM: PORTABLE CHEST 1 VIEW COMPARISON:  04/28/2019 chest radiograph. FINDINGS: Intact sternotomy wires. Stable cardiomediastinal silhouette with mild cardiomegaly. No pneumothorax. Small left pleural effusion, slightly decreased. No significant right pleural effusion. No overt pulmonary edema. Improved aeration at left lung base with residual mild atelectasis. IMPRESSION: 1. Small left pleural effusion, slightly decreased. 2. Improved aeration at the left lung base with residual mild atelectasis. 3. Stable mild cardiomegaly without overt pulmonary edema. Electronically Signed   By: Ilona Sorrel M.D.   On: 04/30/2019 11:13    Cardiac Studies   Echo this admission-EF 65%, possible mild to moderate aortic stenosis.  Patient Profile     79 y.o. female with coronary disease post CABG left atrial appendage clipping with persistent atrial fibrillation  Assessment & Plan    Persistent atrial fibrillation -Continue with good rate control.  Medications reviewed as above.  Metoprolol, digoxin, amiodarone  p.o.   -No changes made today  CAD post CABG -No anginal symptoms.  Ambulating well.  Feeling better.  Dr. Leonarda Salon note reviewed. -Aspirin  Chronic anticoagulation -On Eliquis.  No signs of bleeding.  Also has left atrial appendage clipping noted.  Continues on aspirin 81 mg as well as Eliquis.  No signs of bleeding.  Mild aortic stenosis -On cardiac catheterization LV systolic pressure was [id number], aortic pressure was 158, mild gradient.  On echocardiogram, mild gradient was also noted.  There is  some suspicion however for low output which may underestimate aortic stenosis on echocardiogram.  For now continue with clinical monitoring and surveillance via echocardiogram over the years to come.        For questions or updates, please contact [place] Please consult [email or web address] for contact info under        Signed, Candee Furbish, MD  05/01/2019, 8:54 AM

## 2019-05-01 NOTE — Progress Notes (Signed)
      [address]       [place] [address]             [phone]      Up in chair  "I feel great, ready to go home"  Awaiting transfer  [place]. Roxan Hockey, MD [facility] [phone]

## 2019-05-02 LAB — BASIC METABOLIC PANEL
Anion gap: 11 (ref 5–15)
BUN: 15 mg/dL (ref 8–23)
CO2: 25 mmol/L (ref 22–32)
Calcium: 8.6 mg/dL — ABNORMAL LOW (ref 8.9–10.3)
Chloride: 99 mmol/L (ref 98–111)
Creatinine, Ser: 0.95 mg/dL (ref 0.44–1.00)
GFR calc Af Amer: >60 mL/min (ref >60)
GFR calc non Af Amer: 57 mL/min — ABNORMAL LOW (ref >60)
Glucose, Bld: 106 mg/dL — ABNORMAL HIGH (ref 70–99)
Potassium: 3.7 mmol/L (ref 3.5–5.1)
Sodium: 135 mmol/L (ref 135–145)

## 2019-05-02 LAB — GLUCOSE, CAPILLARY: Glucose-Capillary: 108 mg/dL — ABNORMAL HIGH (ref 70–99)

## 2019-05-02 MED ORDER — POTASSIUM CHLORIDE ER 20 MEQ PO TBCR: 20.0000 meq | EXTENDED_RELEASE_TABLET | Freq: Every day | ORAL | 1 refills | Status: DC

## 2019-05-02 MED ORDER — CYANOCOBALAMIN 500 MCG PO TABS: 500.0000 ug | ORAL_TABLET | Freq: Every day | ORAL | 3 refills | Status: DC

## 2019-05-02 MED ORDER — APIXABAN 5 MG PO TABS: 5.0000 mg | ORAL_TABLET | Freq: Two times a day (BID) | ORAL | 6 refills | Status: DC

## 2019-05-02 MED ORDER — TRAMADOL HCL 50 MG PO TABS: 50.0000 mg | ORAL_TABLET | ORAL | 0 refills | Status: DC | PRN

## 2019-05-02 MED ORDER — FUROSEMIDE 40 MG PO TABS: 40.0000 mg | ORAL_TABLET | Freq: Every day | ORAL | 1 refills | Status: DC

## 2019-05-02 MED ORDER — ASPIRIN 81 MG PO TBEC: 81.0000 mg | DELAYED_RELEASE_TABLET | Freq: Every day | ORAL | Status: DC

## 2019-05-02 MED ORDER — DIGOXIN 125 MCG PO TABS: 0.1250 mg | ORAL_TABLET | Freq: Every day | ORAL | 3 refills | Status: DC

## 2019-05-02 MED ORDER — BUMETANIDE 1 MG PO TABS: 1.0000 mg | ORAL_TABLET | Freq: Every day | ORAL | 1 refills | Status: DC

## 2019-05-02 MED ORDER — AMIODARONE HCL 200 MG PO TABS: 200.0000 mg | ORAL_TABLET | Freq: Two times a day (BID) | ORAL | 1 refills | Status: DC

## 2019-05-02 NOTE — Progress Notes (Signed)
Progress Note  Patient Name: Gabriela Smith Date of Encounter: 05/02/2019  Primary Cardiologist: Minus Breeding, MD   Subjective   Sitting up in bed, comfortable, no shortness of breath.  Ambulating well.  Ready to go home.  Inpatient Medications    Scheduled Meds: . amiodarone  200 mg Oral Q12H   Followed by  . [START ON 05/04/2019] amiodarone  200 mg Oral Daily  . apixaban  5 mg Oral BID  . aspirin EC  81 mg Oral Daily  . B-complex with vitamin C  1 tablet Oral Daily  . bisacodyl  10 mg Oral Daily   Or  . bisacodyl  10 mg Rectal Daily  . bumetanide  1 mg Oral Daily  . Chlorhexidine Gluconate Cloth  6 each Topical Daily  . cholecalciferol  1,000 Units Oral Daily  . digoxin  0.125 mg Oral Daily  . furosemide  40 mg Oral Daily  . gabapentin  100 mg Oral QHS  . lidocaine  1 patch Transdermal Q24H  . magnesium oxide  400 mg Oral QHS  . metoprolol tartrate  50 mg Oral BID  . pantoprazole  40 mg Oral Daily  . potassium chloride  20 mEq Oral Daily  . sodium chloride flush  3 mL Intravenous Q12H  . vitamin B-12  500 mcg Oral Daily   Continuous Infusions: . sodium chloride     PRN Meds: sodium chloride, magnesium hydroxide, menthol-cetylpyridinium, ondansetron (ZOFRAN) IV, oxyCODONE, sodium chloride flush, traMADol   Vital Signs    Vitals:   05/02/19 0420 05/02/19 0450 05/02/19 0807 05/02/19 0823  BP:  108/71  (!) 118/57  Pulse:  64 99 (!) 114  Resp:  19  18  Temp:    98.8 F (37.1 C)  TempSrc:    Oral  SpO2:  97%  98%  Weight: 85 kg     Height:        Intake/Output Summary (Last 24 hours) at 05/02/2019 0953 Last data filed at 05/01/2019 2229 Gross per 24 hour  Intake 1150 ml  Output 150 ml  Net 1000 ml   Last 3 Weights 05/02/2019 05/01/2019 05/01/2019  Weight (lbs) 187 lb 4.8 oz 186 lb 12.8 oz 186 lb 8.2 oz  Weight (kg) 84.959 kg 84.732 kg 84.6 kg      Telemetry    Atrial fibrillation, during ambulation heart rate increased to the 130s but during rest she  is in the 90s- Personally Reviewed  ECG    No new, on admit she was in sinus rhythm- Personally Reviewed  Physical Exam   GEN: No acute distress.   Neck: No JVD Cardiac:  Irregularly irregular, 1/6 systolic murmur, no rubs, or gallops.  Respiratory: Clear to auscultation bilaterally. GI: Soft, nontender, non-distended  MS: No edema; No deformity. Neuro:  Nonfocal  Psych: Normal affect   Labs    High Sensitivity Troponin:   Recent Labs  Lab 04/15/19 1214 04/15/19 1423 04/15/19 2313 04/16/19 0057 04/16/19 0834  TROPONINIHS 156* 167* 340* 493* 440*      Chemistry Recent Labs  Lab 04/29/19 0246 05/01/19 0111 05/02/19 0037  NA 132* 133* 135  K 4.2 3.4* 3.7  CL 96* 99 99  CO2 25 25 25   GLUCOSE 146* 91 106*  BUN 16 17 15   CREATININE 0.82 0.91 0.95  CALCIUM 8.9 8.4* 8.6*  GFRNONAA >60 60* 57*  GFRAA >60 >60 >60  ANIONGAP 11 9 11      Hematology Recent Labs  Lab 04/28/19  [id number] 04/28/19 0439 04/28/19 0752 04/29/19 0246 05/01/19 0111  WBC 10.9*  --   --  9.9 7.4  RBC 2.51*  --   --  2.66* 2.60*  HGB 8.0*   < > 8.5* 8.5* 8.3*  HCT 25.2*   < > 25.0* 26.6* 25.6*  MCV 100.4*  --   --  100.0 98.5  MCH 31.9  --   --  32.0 31.9  MCHC 31.7  --   --  32.0 32.4  RDW 14.0  --   --  14.3 14.3  PLT 170  --   --  244 263   < > = values in this interval not displayed.    BNPNo results for input(s): BNP, PROBNP in the last 168 hours.   DDimer No results for input(s): DDIMER in the last 168 hours.   Radiology    DG Chest 2 View  Result Date: 05/01/2019 CLINICAL DATA:  Post CABG EXAM: CHEST - 2 VIEW COMPARISON:  Chest radiograph from one day prior. FINDINGS: Intact sternotomy wires. Stable cardiomediastinal silhouette with mild cardiomegaly. No pneumothorax. Stable small bilateral pleural effusions, left greater than right. No pulmonary edema. Similar bibasilar atelectasis, left greater than right. IMPRESSION: 1. Small bilateral pleural effusions, left greater than  right, stable. 2. Similar right basilar atelectasis. 3. Stable mild cardiomegaly without overt pulmonary edema. Electronically Signed   By: Ilona Sorrel M.D.   On: 05/01/2019 05:43    Patient Profile     79 y.o. female with coronary artery disease status post CABG, postoperative atrial fibrillation, left atrial appendage clipping, on anticoagulation  Assessment & Plan    Atrial fibrillation -Amiodarone, digoxin, metoprolol.  Continue.  Periods of rapid ventricular response during ambulation however during rest she is adequately controlled.  Hopefully over the next several days she will chemically convert with the amiodarone back to sinus rhythm as she was prior to admission. -Continue with Eliquis.  No signs of bleeding. -If after 1 month she is still in atrial fibrillation, I would encourage cardioversion here in endoscopy.  Normal ejection fraction.  Coronary artery disease status post CABG -No anginal symptoms.  Her prior anginal symptom was more shortness of breath than usual when walking.  Mild aortic stenosis -Noted on echocardiogram.  Continue to monitor clinically.   Okay with discharge. He has close follow-up scheduled. She usually sees Dr. Percival Spanish in the [place] office      For questions or updates, please contact [place] Please consult [email or web address] for contact info under        Signed, Candee Furbish, MD  05/02/2019, 9:53 AM

## 2019-05-02 NOTE — Progress Notes (Addendum)
Pt transferred from [facility] to 4E03, appeared alert and oriented x 4, CHG bath given, call bell within reach, room and equipment instructions given. CCMD called with 2nd person verified EKG Atrial fib on monitor. Denied pain. Assessed sternal wound and EVH  bilateral legs : dry and intact, no drainage, cleaned with Betadine.    HR 90-100 at rest and unsustained Afib with RVR when ambulated to bathroom asymptomatically,  HR 120-148. Pt's on Amiodarone and Metoprolol PO. MD aware. BP 114/61- 155/66 mmHg, RR 16-18, SPO2 96-99 % on room air, Temp 98 F,   No immediate distress on arrival. Will continue to monitor.  Kennyth Lose, RN  .

## 2019-05-02 NOTE — Progress Notes (Signed)
Pt provided discharge instructions and education. Pt IV removed and intact. Pt telebox removed/ccmd notified. Pt denies complaints. Vital stable. Pt has all belongings. Volunteers called to tx pt to valet to meet ride.  Jerald Kief, RN

## 2019-05-02 NOTE — Progress Notes (Signed)
CARDIAC REHAB PHASE I   PRE:  Rate/Rhythm: 105 Afib  BP:  Sitting: 118/57      SaO2: 97 RA  MODE:  Ambulation: 300 ft   POST:  Rate/Rhythm: [phone] Afib  BP:  Sitting: 118/82    SaO2: 96 RA   Pt ambulated 367ft in hallway standby assist with one short standing rest break. Pt states some SOB with ambulation. D/c education completed. Pt instructed on site care and importance of monitoring incisions daily. Encouraged continued sternal precautions, walks, and IS use. Pt given in-the-tube sheet along with heart healthy diet. Reviewed restrictions, and exercise guidelines. Will refer to CRP II [place].  [id number] Rufina Falco, RN BSN 05/02/2019 8:58 AM

## 2019-05-02 NOTE — Progress Notes (Signed)
      [address]       [place] [address]             [phone]      7 Days Post-Op Procedure(s) (LRB): CORONARY ARTERY BYPASS GRAFTING (CABG) times four using left internal mammary artery and left greater saphenous vein harvested endoscipically. (N/A) Clipping Of Atrial Appendage (N/A)   Subjective:  States she could have slept a little better last night.  She is otherwise feeling well and wants to go home.  + ambulation  + BM  Objective: Vital signs in last 24 hours: Temp:  [97.7 F (36.5 C)-98.8 F (37.1 C)] 97.7 F (36.5 C) (02/22 0300) Pulse Rate:  [64-119] 64 (02/22 0450) Cardiac Rhythm: Atrial fibrillation (02/22 0417) Resp:  [16-25] 19 (02/22 0450) BP: (88-155)/(49-79) 108/71 (02/22 0450) SpO2:  [89 %-100 %] 97 % (02/22 0450) Weight:  [84.7 kg-85 kg] 85 kg (02/22 0420)  Intake/Output from previous day: 02/21 0701 - 02/22 0700 In: 1390 [P.O.:1390] Out: 150 [Urine:150]  General appearance: alert, cooperative and no distress Heart: irregularly irregular rhythm Lungs: clear to auscultation bilaterally Abdomen: soft, non-tender; bowel sounds normal; no masses,  no organomegaly Extremities: edema trace Wound: clean and dry  Lab Results: Recent Labs    05/01/19 0111  WBC 7.4  HGB 8.3*  HCT 25.6*  PLT 263   BMET:  Recent Labs    05/01/19 0111 05/02/19 0037  NA 133* 135  K 3.4* 3.7  CL 99 99  CO2 25 25  GLUCOSE 91 106*  BUN 17 15  CREATININE 0.91 0.95  CALCIUM 8.4* 8.6*    PT/INR: No results for input(s): LABPROT, INR in the last 72 hours. ABG    Component Value Date/Time   PHART 7.349 (L) 04/25/2019 2031   HCO3 24.0 04/28/2019 0752   TCO2 25 04/28/2019 0752   ACIDBASEDEF 1.0 04/28/2019 0752   O2SAT 59.0 04/28/2019 0752   CBG (last 3)  Recent Labs    05/01/19 1541 05/01/19 2127 05/02/19 0609  GLUCAP 122* 96 108*    Assessment/Plan: S/P Procedure(s) (LRB): CORONARY ARTERY BYPASS GRAFTING (CABG) times four using  left internal mammary artery and left greater saphenous vein harvested endoscipically. (N/A) Clipping Of Atrial Appendage (N/A)  1. CV- rate controlled A. Fib, on Lopressor, Amiodarone, Eliquis 2. Pulm-  Off oxygen, continue IS 3. Renal- creatinine has been stable, weight is up about 17 lbs, continue diuretics 4. Dispo- patient remains in rate controlled A.Fib, she looks great, possible for d/c today   LOS: 17 days    Mayrani Khamis PA-C 05/02/2019

## 2019-05-05 ENCOUNTER — Telehealth: Payer: Self-pay | Admitting: Cardiology

## 2019-05-05 NOTE — Telephone Encounter (Signed)
Pt c/o swelling: STAT is pt has developed SOB within 24 hours  1) How much weight have you gained and in what time span? 5lbs in a day  2) If swelling, where is the swelling located? Legs and feet  3) Are you currently taking a fluid pill? yes  4) Are you currently SOB? no  5) Do you have a log of your daily weights (if so, list)? Monday Tuesday and  Wednesday patient weighed: 180.4lbs, today she weighs: 185lbs   6) Have you gained 3 pounds in a day or 5 pounds in a week? 5 lbs in a day  7) Have you traveled recently? no  Patient's daughter states the patient has was told to call if the patient gained any weight since her open heart surgery. She states the patient gained 5lbs in a day and has swelling in her legs and feet. She also states the patient is very panicky and has not been able to go to the bathroom since Monday. Please advise.

## 2019-05-05 NOTE — Telephone Encounter (Signed)
Called patient daughter back- she states that her mother had CABG on 2/15- her normal weight has been 180, but this morning it was 185lb, she has had severe swelling in her legs that last few days, denies chest pain, and no active shortness of breath. Blood pressure has been 124/78 and HR has been 104-98, patient currently takes 40 mg lasix daily. Lab work from 3 days ago was good. Spoke with DOD Dr.Hochrein- he suggested to increase lasix to 80 mg for 3 days then back to normal dose, and to have upcoming appointment- patient will be seen by NP on 03/01.   Patient daughter advised and verbalized understanding.

## 2019-05-06 ENCOUNTER — Telehealth: Payer: Self-pay | Admitting: Internal Medicine

## 2019-05-06 ENCOUNTER — Telehealth: Payer: Self-pay | Admitting: Cardiology

## 2019-05-06 NOTE — Telephone Encounter (Signed)
Cardiology Moon[place] Note  Returned paged from patient's daughter. Patient discharged from CABG on 2/16. Has had worsening lower extremity edema since that time. Has been taking lasix 40mg  once daily. Doesn't feel like her urine output has increased much with this dose. Called her Dr office today about this and was told to increase her dose to 80mg . She took 80mg  once this morning and her urine output increased substantially, though she doesn't feel as though her leg swelling has improved much.   Patient denies chest pain, chest pressure, shortness of breath, orthopnea, exertional dyspnea. Currently patient is sleeping comfortably. No leg pain or erythema.   Patient's daughter states that she prefers to try to treat patient at home rather than come to ED. I recommended the patient increase lasix to 80mg  twice daily (AM and afternoon). She will also increase her potassium to 107mEq twice daily. She understands that she needs to bring patient to the ED if she develops any fevers, chills, chest pain/pressure, SOB, orthopnea, worsening leg swelling, leg/calf pain, or any other new/concerning symptoms. Patient has follow up with Dr Percival Spanish on Monday. Will cc Dr Percival Spanish on this note.   Marcie Mowers, MD Cardiology Fellow, PGY-7

## 2019-05-06 NOTE — Telephone Encounter (Signed)
Spoke with patient's daughter. Educated daughter to give patient 40mg  of lasix in the morning and 40mg  at lunch. Daughter verbalized understanding.

## 2019-05-06 NOTE — Telephone Encounter (Signed)
Spoke with patient's daughter. Patient taking increased dose of lasix for the next 3 days. While it is working and her swelling is decreasing she is now nauseated when taking the lasix. Patient is taking the lasix with food. Will route to pharm d for recommendations.

## 2019-05-06 NOTE — Telephone Encounter (Signed)
   Pt c/o medication issue:  1. Name of Medication: furosemide (LASIX) 40 MG tablet  2. How are you currently taking this medication (dosage and times per day)?   3. Are you having a reaction (difficulty breathing--STAT)? Pt nauseated   4. What is your medication issue? Pt daughter called, she said that Dr. Percival Spanish nurse called her yesterday and increase pt's lasix dosage, it worked however it made the pt become very nauseated. She wants to know what can they do to avoid that.  Please advise

## 2019-05-06 NOTE — Telephone Encounter (Signed)
Split the dose total dose to improve tolerability.  Take 40mg  with breakfast and 40mg  with lunch.

## 2019-05-07 NOTE — Telephone Encounter (Signed)
The patient has an appt on Monday.

## 2019-05-08 NOTE — Progress Notes (Addendum)
Cardiology Office Note  Date: 05/09/2019   ID: Gabriela Smith, DOB May 23, 1940, MRN [id number]  PCP:  Patient, No Pcp Per  Cardiologist:  Minus Breeding, MD Electrophysiologist:  None   Chief Complaint  Patient presents with  . Edema    From her knees down.  . Follow-up    CABG x 4, A FIB, LE edema    History of Present Illness: Gabriela Smith is a 79 y.o. female with a history of CAD with PTCA 1993.  Negative stress Myoview 2017, AAA, bladder cancer, HLD, HTN, CHF, arthritis, new onset atrial fibrillation with RVR (CHADsVASC = 4 (HTN, AGE SEX).  History of statin intolerance.   Last talked to Dr. Percival Spanish Jul 19, 2018 via televisit.  She had no particular complaints at that visit.  Dr. Percival Spanish noted she has been intolerant of statins in the past and likely needed to go to lipid clinic for PCSK9 initiation.  Most recent LDL level was 142.  She presented to the emergency room on April 15, 2019 with complaints of shortness of breath began at 4 AM with associated elevated troponin (156; 167; 340; 493 ;440 ), negative Covid, chest x-ray minimal bibasilar interstitial densities noted concerning for pulmonary edema.  Cardiac catheterization 04/19/2019;Severe multivessel disease with mild ostial and severe distal LEFT MAIN disease involving ostial LCx with severe proximal and mid LAD disease as well as ostial and mid RCA diffuse disease.  04/25/2019 CORONARY ARTERY BYPASS GRAFTING x 4 -LIMA to DIAGONAL -SVG to MID LAD -SVG to OM -SVG to PDA  05/05/2019. Patient's daughter called and stated mothers weight was up to 185 from 180 and legs were severely swelling. She was instructed to take Lasix 80 mg x 3 days then revert back to previous dose.  Patient denies any issues other than feeling tired.  States she has been walking around her apartment some during the day.  She has been using her incentive spirometry.  States she has some issues with taking deep breaths as if she cannot take a full  deep breath..  States she has some sensation of fullness below both breasts.  States her median sternotomy site looks good.  States she has been losing some weight.  Today's weight per her scales was 182.  States the lower extremity edema is improving some.  Past Medical History:  Diagnosis Date  . AAA (abdominal aortic aneurysm) [facility])    cardiology aware   . Arthritis    OA lt knee- cortizone inj. q 4 months AND OA ALSO IN BACK. Had knee replaced-issue resolved  . CAD (coronary artery disease) CARDIOLOGIST- DR [facility]--  VISIT 01-02-10 IN EPIC   1993-- PTCA. Pt describes a near total blockage of apparently the LAD and a 65% stenosis elsewhere.  . Cancer ([place])   . History of bladder cancer followed by dr Karsten Ro   hx  TCC of bladder ,  Ta G3-  first occurence 02-20-2012--  s/p BCG tx's  and  mitomycin C  . Hyperlipidemia 1993  . Hypertension 1993  . Nocturia   . Osteopenia   . Sigmoid diverticulosis     Past Surgical History:  Procedure Laterality Date  . CHOLECYSTECTOMY  2003 (approx)  . CLIPPING OF ATRIAL APPENDAGE N/A 04/25/2019   Procedure: Clipping Of Atrial Appendage;  Surgeon: Lajuana Matte, MD;  Location: [place];  Service: Open Heart Surgery;  Laterality: N/A;  . COLONOSCOPY  05-31-2003  . CORONARY ANGIOPLASTY  1993   to LAD  .  CORONARY ARTERY BYPASS GRAFT N/A 04/25/2019   Procedure: CORONARY ARTERY BYPASS GRAFTING (CABG) times four using left internal mammary artery and left greater saphenous vein harvested endoscipically.;  Surgeon: Lajuana Matte, MD;  Location: [place];  Service: Open Heart Surgery;  Laterality: N/A;  . CYSTOSCOPY WITH BIOPSY  05/09/2011   Procedure: CYSTOSCOPY WITH BIOPSY;  Surgeon: Claybon Jabs, MD;  Location: [facility];  Service: Urology;  Laterality: N/A;  WITH BLADDER BIOPSY GYRUS  . LEFT HEART CATH AND CORONARY ANGIOGRAPHY N/A 04/19/2019   Procedure: LEFT HEART CATH AND CORONARY ANGIOGRAPHY;  Surgeon: Leonie Man, MD;   Location: [place] [facility];  Service: Cardiovascular;  Laterality: N/A;  . TOTAL HIP ARTHROPLASTY Right 12/25/2015   Procedure: RIGHT TOTAL HIP ARTHROPLASTY ANTERIOR APPROACH;  Surgeon: Paralee Cancel, MD;  Location: [facility];  Service: Orthopedics;  Laterality: Right;  . TOTAL KNEE ARTHROPLASTY Left 11/22/2012   Procedure: LEFT TOTAL KNEE ARTHROPLASTY;  Surgeon: Mauri Pole, MD;  Location: [facility];  Service: Orthopedics;  Laterality: Left;  . TRANSURETHRAL RESECTION OF BLADDER TUMOR  03/14/2011   Procedure: TRANSURETHRAL RESECTION OF BLADDER TUMOR (TURBT);  Surgeon: Claybon Jabs, MD;  Location: [facility];  Service: Urology;  Laterality: N/A;  . TRANSURETHRAL RESECTION OF BLADDER TUMOR  05/09/2011   Procedure: TRANSURETHRAL RESECTION OF BLADDER TUMOR (TURBT);  Surgeon: Claybon Jabs, MD;  Location: [facility];  Service: Urology;  Laterality: N/A;  . TRANSURETHRAL RESECTION OF BLADDER TUMOR  03/08/2012   Procedure: TRANSURETHRAL RESECTION OF BLADDER TUMOR (TURBT);  Surgeon: Claybon Jabs, MD;  Location: [facility];  Service: Urology;  Laterality: N/A;  . TRANSURETHRAL RESECTION OF BLADDER TUMOR WITH GYRUS (TURBT-GYRUS) N/A 03/24/2014   Procedure: TRANSURETHRAL RESECTION OF BLADDER TUMOR WITH GYRUS (TURBT-GYRUS);  Surgeon: Claybon Jabs, MD;  Location: [facility];  Service: Urology;  Laterality: N/A;  . UMBILICAL HERNIA REPAIR  2001  (approx)    Current Outpatient Medications  Medication Sig Dispense Refill  . acetaminophen (TYLENOL) 500 MG tablet Take 2 tablets (1,000 mg total) by mouth every 8 (eight) hours. (Patient taking differently: Take 1,000 mg by mouth at bedtime as needed for headache (pain/sleep). ) 30 tablet 0  . amiodarone (PACERONE) 200 MG tablet Take 1 tablet (200 mg total) by mouth 2 (two) times daily. For 2 days, then decrease to 200 mg daily 60 tablet 1  . apixaban (ELIQUIS) 5 MG TABS tablet Take 1 tablet (5 mg total)  by mouth 2 (two) times daily. 60 tablet 6  . aspirin EC 81 MG EC tablet Take 1 tablet (81 mg total) by mouth daily.    [name] b complex vitamins tablet Take 1 tablet by mouth daily after breakfast.     . betamethasone valerate (VALISONE) 0.1 % cream Apply 1 application topically at bedtime. Apply externally to vaginal area    . bumetanide (BUMEX) 1 MG tablet Take 1 tablet (1 mg total) by mouth daily. 30 tablet 1  . cholecalciferol (VITAMIN D) 1000 UNITS tablet Take 1,000 Units by mouth daily after breakfast.     . clindamycin (CLEOCIN) 2 % vaginal cream Place 1 Applicatorful vaginally at bedtime.    [name] Co-Enzyme Q10 200 MG CAPS Take 200 mg by mouth daily after breakfast.     . digoxin (LANOXIN) 0.125 MG tablet Take 1 tablet (0.125 mg total) by mouth daily. 30 tablet 3  . Flaxseed, Linseed, (FLAX SEED OIL) 1000 MG  CAPS Take 1,000 mg by mouth daily after breakfast.     . furosemide (LASIX) 40 MG tablet Take 1 tablet (40 mg total) by mouth daily. 30 tablet 1  . gabapentin (NEURONTIN) 100 MG capsule Take 1 capsule (100 mg total) by mouth at bedtime. 90 capsule 3  . Magnesium 300 MG CAPS Take 300 mg by mouth at bedtime.     . metoprolol tartrate (LOPRESSOR) 50 MG tablet TAKE (1) TABLET TWICE A DAY. (Patient taking differently: Take 50 mg by mouth 2 (two) times daily. TAKE (1) TABLET TWICE A DAY.) 180 tablet 3  . nitroGLYCERIN (NITROSTAT) 0.4 MG SL tablet PLACE 1 TABLET UNDER THE TONGUE AT ONSET OF CHEST PAIN EVERY 5 MINTUES UP TO 3 TIMES AS NEEDED (Patient taking differently: Place 0.4 mg under the tongue every 5 (five) minutes as needed for chest pain. ) 25 tablet 0  . OVER THE COUNTER MEDICATION Take 1 tablet by mouth daily after breakfast. Elderberry/Vitamin C    . potassium chloride 20 MEQ TBCR Take 20 mEq by mouth daily. 30 tablet 1  . traMADol (ULTRAM) 50 MG tablet Take 1-2 tablets (50-100 mg total) by mouth every 4 (four) hours as needed for moderate pain. 30 tablet 0  . TURMERIC PO Take 1 capsule by  mouth daily after breakfast.    . vitamin B-12 (CYANOCOBALAMIN) 500 MCG tablet Take 1 tablet (500 mcg total) by mouth daily. 30 tablet 3   No current facility-administered medications for this visit.   Facility-Administered Medications Ordered in Other Visits  Medication Dose Route Frequency Provider Last Rate Last Admin  . DOBUTamine (DOBUTREX) 1,000 mcg/mL in dextrose 5% 250 mL infusion  20 mcg/kg/min Intravenous Continuous Dorothy Spark, MD 96.4 mL/hr at 12/24/15 0840 20 mcg/kg/min at 12/24/15 0840   Allergies:  Fish allergy, Fish-derived products, Shellfish-derived products, Lasix [furosemide], Macrodantin [nitrofurantoin macrocrystal], Vicodin [hydrocodone-acetaminophen], Zetia [ezetimibe], Codeine, Latex, Niacin, Niaspan [niacin er], Statins, and Sulfa antibiotics   Social History: The patient  reports that she quit smoking about 43 years ago. Her smoking use included cigarettes. She has a 2.50 pack-year smoking history. She has never used smokeless tobacco. She reports current alcohol use of about 5.0 standard drinks of alcohol per week. She reports that she does not use drugs.   Family History: The patient's family history includes Coronary artery disease (age of onset: 69) in her father; Heart attack (age of onset: 32) in her daughter; Heart attack (age of onset: 6) in her mother; Heart disease (age of onset: 68) in her son; Hyperlipidemia in her father, mother, and sister; Osteoporosis in her sister.   ROS:  Please see the history of present illness. Otherwise, complete review of systems is positive for none.  All other systems are reviewed and negative.   Physical Exam: VS:  BP 124/72 (BP Location: Right Arm, Patient Position: Sitting, Cuff Size: Large)   Pulse 83   Ht 5\' 3"  (1.6 m)   Wt 189 lb (85.7 kg)   BMI 33.48 kg/m , BMI Body mass index is 33.48 kg/m.  Wt Readings from Last 3 Encounters:  05/09/19 189 lb (85.7 kg)  05/02/19 187 lb 4.8 oz (85 kg)  02/17/19 190 lb  (86.2 kg)    General: Patient appears comfortable at rest. Neck: Supple, no elevated JVP or carotid bruits, no thyromegaly. Lungs: Clear to auscultation, nonlabored breathing at rest. Cardiac: Irregularly irregular and rhythm, no S3 or significant systolic murmur, no pericardial rub. Extremities: Mild pitting edema, distal pulses  2+. Skin: Warm and dry.  Median sternotomy scar clean and dry with no evidence of redness, swelling or infection. Musculoskeletal: No kyphosis. Neuropsychiatric: Alert and oriented x3, affect grossly appropriate.  ECG:  An ECG dated 05/09/2019 was personally reviewed today and demonstrated:  Atrial fibrillation rate of 83, anterior infarct, age undetermined, ST and T wave abnormality, consider inferior lateral ischemia or digitalis effect.  Recent Labwork: 04/15/2019: ALT 45; AST 30; B Natriuretic Peptide 841.1 04/27/2019: Magnesium 2.2; TSH 8.256 05/01/2019: Hemoglobin 8.3; Platelets 263 05/02/2019: BUN 15; Creatinine, Ser 0.95; Potassium 3.7; Sodium 135     Component Value Date/Time   CHOL 227 (H) 04/16/2019 0508   CHOL 291 (H) 02/17/2019 0902   TRIG 131 04/16/2019 0508   TRIG 414 (H) 07/31/2016 0942   HDL 59 04/16/2019 0508   HDL 62 02/17/2019 0902   HDL 49 07/31/2016 0942   CHOLHDL 3.8 04/16/2019 0508   VLDL 26 04/16/2019 0508   LDLCALC 142 (H) 04/16/2019 0508   LDLCALC 185 (H) 02/17/2019 0902   LDLCALC 205 (H) 11/28/2013 1215   LDLDIRECT 193 (H) 10/21/2016 0902    Other Studies Reviewed Today:  AAA duplex 06/05/2017 Final Interpretation:  Abdominal Aorta: There is evidence of borderline dilitation of the Mid Abdominal aorta. The largest aortic measurement is 2.4 cm. The largest aortic diameter remains essentially unchanged compared to prior exam. Previous diameter measurement was 2.7 cm obtained on 08/2015.  Upper and lower arterial duplex study 04/19/2019 Summary: Right ABI: Resting right ankle-brachial index indicates moderate right lower extremity  arterial disease. Left ABI: Resting left ankle-brachial index is within normal range. No evidence of significant left lower extremity arterial disease. Left Upper Extremity: Doppler waveform obliterate with left radial compression. Doppler waveforms decrease >50% with left ulnar compression.  Cardiac catheterization 04/19/2019  LEFT CORONARY ARTERY  Ost LM lesion is 40% stenosed. Mid LM to Ost LAD lesion is 85% stenosed with 95% stenosed side branch in Ost Cx.  Prox LAD to Mid LAD lesion is 90% stenosed -proximal to 1st Diag; Mid LAD lesion is 80% stenosed after 1st Diag  1st Diag lesion is 10% stenosed with 55% stenosed side branch in Lat 1st Diag.  Prox Cx to Mid Cx lesion is 35% stenosed.  RIGHT CORONARY ARTERY  Ost RCA to Prox RCA lesion is 60% stenosed. Prox RCA lesion is 70% stenosed.  Mid RCA to Dist RCA lesion is 90% stenosed.  Dist RCA lesion is 20% stenosed with 50% stenosed side branch in RPAV. RPAV lesion is 65% stenosed. RPDA lesion is 50% stenosed.  -------------------  LV end diastolic pressure is normal.   SUMMARY  Severe multivessel disease with mild ostial and severe distal LEFT MAIN disease involving ostial LCx with severe proximal and mid LAD disease as well as ostial and mid RCA diffuse disease.  Normal LVEDP with normal EF by Echocardiogram  Coronary Diagrams  Diagnostic Dominance: Right  Intervention     Transthoracic echocardiogram 04/16/2019 1. Left ventricular ejection fraction, by visual estimation, is 60 to 65%. The left ventricle has normal function. There is no left ventricular hypertrophy. 2. The left ventricle has no regional wall motion abnormalities. 3. Left ventricular diastolic function could not be evaluated. 4. Global right ventricle has normal systolic function.The right ventricular size is normal. Noincrease in right ventricular wall thickness. 5. Left atrial size was mildly dilated. 6. Right atrial size was normal. 7. Mild  calcification of the anterior mitral valve leaflet(s). Mild thickening of the anterior mitral valve leaflet(s).  Moderate mitral annular calcification. Mild to moderate mitral valve regurgitation. No evidence of mitral stenosis. 8. The tricuspid valve is normal in structure. Tricuspid valve regurgitation is mild. 9. The aortic valve was not well visualized. Aortic valve regurgitation is not visualized. No evidence of aortic valve sclerosis or stenosis. 10. The pulmonic valve was normal in structure. Pulmonic valve regurgitation is not visualized. 11. Normal pulmonary artery systolic pressure. 12. The inferior vena cava is normal in size with greater than 50% respiratory variability, suggesting right atrial pressure of 3 mmHg. 13. Small pericardial effusion. 14. The pericardial effusion is anterior to the right ventricle. 15. There appears to be some degree of aortic stenosis but doppler interrogation of the AV  not done. Recommend repeat limited study of the AV to assess for aortic stenosis   Abdominal aortic ultrasound March 2019 Final Interpretation:  Abdominal Aorta: There is evidence of borderline dilitation of the Mid  Abdominal aorta. The largest aortic measurement is 2.4 cm. The largest  aortic diameter remains essentially unchanged compared to prior exam.  Previous diameter measurement was 2.7 cm  obtained on 6/17.  Stenosis: +--------------------+-------------+  Location      Stenosis     +--------------------+-------------+  Distal Aorta    <50% stenosis  +--------------------+-------------+  Right Common Iliac <50% stenosis  +--------------------+-------------+  Left Common Iliac  <50% stenosis  +--------------------+-------------+  Right External Iliac<50% stenosis  +--------------------+-------------+  Left External Iliac <50% stenosis  +--------------------+-------------+     Assessment and Plan:  1. S/P CABG x 4   2. Coronary artery  disease involving native coronary artery of native heart without angina pectoris   3. Paroxysmal atrial fibrillation (HCC)   4. AAA (abdominal aortic aneurysm) without rupture ([place])   5. Hyperlipidemia LDL goal <70   6. Hypertension   7. Bilateral lower extremity edema    1. S/P CABG x 4 04/25/2019 CORONARY ARTERY BYPASS GRAFTING x 4:-LIMA to DIAGONAL-SVG to MID LAD-SVG to OM-SVG to PDA.  Patient states she is doing well other than some mild fullness below her breasts bilaterally.  Median sternotomy scar clean and dry with no evidence of redness or infection.  States she has some trouble taking an effective deep breath.  She is using his incentive spirometry at home.  Encouraged her to use this a few times a day.  On echo she had some mild to moderate MR and evidence of possible aortic stenosis.  It was recommended she have a limited study to determine extent of aortic stenosis.  She follows up with CVTS in a week.   2. Coronary artery disease involving native coronary artery of native heart without angina pectoris recent cardiac cath demonstrated severe multivessel disease with mild ostial and severe distal LEFT MAIN disease involving ostial LCx with severe proximal and mid LAD disease as well as ostial and mid RCA diffuse disease.Normal LVEDP with normal EF by Echocardiogram.  Continue aspirin 81 mg daily  3. Paroxysmal atrial fibrillation (HCC) Apparently new onset atrial fibrillation in the hospital surrounding the surgery.  She was in atrial fib with RVR.  Today EKG shows atrial fibrillation with a rate of 83, evidence of anterior infarct, age undetermined, ST and T wave abnormalities, consider inferior lateral ischemia.  She is on amiodarone 200 mg p.o. twice daily,  Eliquis 5 mg p.o. twice daily.  Consider DCCV.  Discussed with patient that DC cardioversion might be appropriate under the circumstances.  She has been anticoagulated since 05/02/2019.  She understands that this may help  convert her  to a normal sinus rhythm.  She is having some issues with fluid accumulation in lower extremities.  She denies any sensation of having palpitations.  Continue amiodarone and Eliquis.  Continue metoprolol 50 mg p.o. twice daily.    4. AAA (abdominal aortic aneurysm) without rupture (HCC) History of AAA.  Last measurement March 2019 aortic diameter remaied essentially unchanged compared to prior exam. Previous diameter measurement was 2.7 cm  obtained on 6/17.  We will continue to monitor her with periodic aortic ultrasounds.  5. Hyperlipidemia LDL goal <70 Cholesterol has been uncontrolled.  She is very statin intolerant.  She needs to be referred to lipid clinic for [place] 9 therapy given her recent four-vessel bypass.  We do not want these grafts to suffer the same consequence of her native arteries.  6. Hypertension Blood pressure today 124/72.  Get BMP, BNP, CBC.  7. Bilateral lower extremity edema Patient does have some mild bilateral lower extremity edema.  She states this is improved since starting Lasix.  Continue Lasix 40 mg daily.  Advised her to weigh herself every day and if weight increases by 3 pounds in a day or 5 pounds in a week to take an extra dose of Lasix. Advised to try low garde compression stockings during the day if able to tolerate. Patient states she will try to wear.   Medication Adjustments/Labs and Tests Ordered: Current medicines are reviewed at length with the patient today.  Concerns regarding medicines are outlined above.   Disposition : Dr Percival Spanish  Signed, Levell July, NP 05/09/2019 10:23 AM    [place]

## 2019-05-09 ENCOUNTER — Encounter: Payer: Self-pay | Admitting: Adult Health

## 2019-05-09 ENCOUNTER — Ambulatory Visit: Payer: Medicare Other | Admitting: Family Medicine

## 2019-05-09 ENCOUNTER — Other Ambulatory Visit: Payer: Self-pay

## 2019-05-09 VITALS — BP 124/72 | HR 83 | Ht 63.0 in | Wt 189.0 lb

## 2019-05-09 DIAGNOSIS — I10 Essential (primary) hypertension: Secondary | ICD-10-CM

## 2019-05-09 DIAGNOSIS — R600 Localized edema: Secondary | ICD-10-CM

## 2019-05-09 DIAGNOSIS — I714 Abdominal aortic aneurysm, without rupture, unspecified: Secondary | ICD-10-CM

## 2019-05-09 DIAGNOSIS — E785 Hyperlipidemia, unspecified: Secondary | ICD-10-CM | POA: Diagnosis not present

## 2019-05-09 DIAGNOSIS — Z951 Presence of aortocoronary bypass graft: Secondary | ICD-10-CM

## 2019-05-09 DIAGNOSIS — I480 Paroxysmal atrial fibrillation: Secondary | ICD-10-CM | POA: Diagnosis not present

## 2019-05-09 DIAGNOSIS — I2510 Atherosclerotic heart disease of native coronary artery without angina pectoris: Secondary | ICD-10-CM

## 2019-05-09 LAB — SPECIMEN STATUS REPORT

## 2019-05-09 NOTE — Patient Instructions (Signed)
Medication Instructions:  Continue current medications  *If you need a refill on your cardiac medications before your next appointment, please call your pharmacy*   Lab Work: BMP, BNP, CBC   Testing/Procedures: None Ordered   Follow-Up: At [facility], you and your health needs are our priority.  As part of our continuing mission to provide you with exceptional heart care, we have created designated Provider Care Teams.  These Care Teams include your primary Cardiologist (physician) and Advanced Practice Providers (APPs -  Physician Assistants and Nurse Practitioners) who all work together to provide you with the care you need, when you need it.  We recommend signing up for the patient portal called "MyChart".  Sign up information is provided on this After Visit Summary.  MyChart is used to connect with patients for Virtual Visits (Telemedicine).  Patients are able to view lab/test results, encounter notes, upcoming appointments, etc.  Non-urgent messages can be sent to your provider as well.   To learn more about what you can do with MyChart, go to [email or web address].    Your next appointment:   2 week(s)  The format for your next appointment:   In Person  Provider:   Jory Sims, DNP, ANP   Other Instructions 1 week Lipid Clinic for PcsK9

## 2019-05-10 LAB — BASIC METABOLIC PANEL
BUN/Creatinine Ratio: 19 (ref 12–28)
BUN: 18 mg/dL (ref 8–27)
CO2: 27 mmol/L (ref 20–29)
Calcium: 9.4 mg/dL (ref 8.7–10.3)
Chloride: 95 mmol/L — ABNORMAL LOW (ref 96–106)
Creatinine, Ser: 0.94 mg/dL (ref 0.57–1.00)
GFR calc Af Amer: 66 mL/min/{1.73_m2} (ref >59)
GFR calc non Af Amer: 57 mL/min/{1.73_m2} — ABNORMAL LOW (ref >59)
Glucose: 85 mg/dL (ref 65–99)
Potassium: 4.6 mmol/L (ref 3.5–5.2)
Sodium: 137 mmol/L (ref 134–144)

## 2019-05-10 LAB — CBC
Hematocrit: 25.9 % — ABNORMAL LOW (ref 34.0–46.6)
Hemoglobin: 8.7 g/dL — ABNORMAL LOW (ref 11.1–15.9)
MCH: 31.6 pg (ref 26.6–33.0)
MCHC: 33.6 g/dL (ref 31.5–35.7)
MCV: 94 fL (ref 79–97)
Platelets: 482 10*3/uL — ABNORMAL HIGH (ref 150–450)
RBC: 2.75 x10E6/uL — ABNORMAL LOW (ref 3.77–5.28)
RDW: 14.3 % (ref 11.7–15.4)
WBC: 10.5 10*3/uL (ref 3.4–10.8)

## 2019-05-10 LAB — BRAIN NATRIURETIC PEPTIDE: BNP: 393.1 pg/mL — ABNORMAL HIGH (ref 0.0–100.0)

## 2019-05-11 ENCOUNTER — Encounter: Payer: Medicare Other | Admitting: Family Medicine

## 2019-05-13 ENCOUNTER — Other Ambulatory Visit: Payer: Self-pay

## 2019-05-13 ENCOUNTER — Encounter: Payer: Self-pay | Admitting: Thoracic Surgery (Cardiothoracic Vascular Surgery)

## 2019-05-13 ENCOUNTER — Ambulatory Visit (INDEPENDENT_AMBULATORY_CARE_PROVIDER_SITE_OTHER): Payer: Self-pay | Admitting: Thoracic Surgery (Cardiothoracic Vascular Surgery)

## 2019-05-13 VITALS — BP 128/78 | HR 106 | Temp 97.7°F | Resp 24 | Ht 63.0 in | Wt 189.0 lb

## 2019-05-13 DIAGNOSIS — Z951 Presence of aortocoronary bypass graft: Secondary | ICD-10-CM

## 2019-05-13 NOTE — Progress Notes (Signed)
      [address]       [place] [address]             [phone]        Gabriela Smith [place] Medical Record [id number] Date of Birth: Oct 03, 1940  Referring: Minus Breeding, MD Primary Care: Patient, No Pcp Per Primary Cardiologist:James Hochrein, MD  Reason for visit:   follow-up  History of Present Illness:     Gabriela Smith presents for her first follow-up.  She complains of some shortness of breath extremity swelling but otherwise has done well.  She denies any chest pain.[name]  Physical Exam: BP 128/78 (BP Location: Left Arm, Patient Position: Sitting, Cuff Size: Normal)   Pulse (!) 106 Comment: irregular rhythm  Temp 97.7 F (36.5 C) (Skin)   Resp (!) 24   Ht 5\' 3"  (1.6 m)   Wt 189 lb (85.7 kg)   SpO2 98% Comment: RA  BMI 33.48 kg/m   Alert NAD Incision clean Sternum stable Abdomen soft, ND 1+  peripheral edema      Assessment / Plan:   79 year old female status post CABG atrial clip, with atrial fibrillation. She remains in A. fib with a rate in the 100s today.  Her blood pressure is stable.  She is on amiodarone and Eliquis. She will follow-up in 1 month with a chest x-ray.   Gabriela Smith 05/13/2019 1:36 PM

## 2019-05-29 NOTE — Progress Notes (Signed)
Cardiology Office Note   Date:  05/30/2019   ID:  [place], Xing [birth year], MRN [id number]  PCP:  [name], DO  Cardiologist:  Dr. Percival Spanish  CC:  Hospital follow up   History of Present Illness: Gabriela Smith is a 79 y.o. female who presents for ongoing assessment and management of  new onset atrial fibrillation with RVR (CHADsVASC = 4 (HTN, AGE SEX) on amiodarone and apixaban.  and CAD, with hx of CABG. She is intolerant to statins, Negative stress Myoview 2017, AAA, bladder cancer, HLD, HTN, CHF, arthritis  She had a cardiac cath on 04/19/2019 noting severe multivessel disease, with mild osteal and severe distal left main disease involving the LCx with severe proximal and mid LAD diease, as well as ostial and mid RCA diffuse disease. As a result, she underwent CABG X 4 with LIMA to LAD, SVG to Mid LAD, SVG to OM, and SVG to PDA.   She was last seen in the office on 05/09/2019 by Levell July, NP. At that time she was complaining of LEE and fatigue. EKG revealed rate controlled atrial fib. Consideration for DCCV was discussed. She was to be referred to the lipid clinic for PCSK9 inhibition. She was continued on lasix 40 mg daily.  She comes today with her daughter with multiple questions.  She denies chest pain, DOE. She is slowly getting better and regaining her strength. She is compliant with her medications. She continues have mild LEE. She is now on lower dose of amiodarone.   Past Medical History:  Diagnosis Date  . AAA (abdominal aortic aneurysm) [facility])    cardiology aware   . Arthritis    OA lt knee- cortizone inj. q 4 months AND OA ALSO IN BACK. Had knee replaced-issue resolved  . CAD (coronary artery disease) CARDIOLOGIST- DR [facility]--  VISIT 01-02-10 IN EPIC   1993-- PTCA. Pt describes a near total blockage of apparently the LAD and a 65% stenosis elsewhere.  . Cancer ([place])   . History of bladder cancer followed by dr Karsten Ro   hx  TCC of bladder ,  Ta G3-  first  occurence 02-20-2012--  s/p BCG tx's  and  mitomycin C  . Hyperlipidemia 1993  . Hypertension 1993  . Nocturia   . Osteopenia   . Sigmoid diverticulosis     Past Surgical History:  Procedure Laterality Date  . CHOLECYSTECTOMY  2003 (approx)  . CLIPPING OF ATRIAL APPENDAGE N/A 04/25/2019   Procedure: Clipping Of Atrial Appendage;  Surgeon: Lajuana Matte, MD;  Location: [place];  Service: Open Heart Surgery;  Laterality: N/A;  . COLONOSCOPY  05-31-2003  . CORONARY ANGIOPLASTY  1993   to LAD  . CORONARY ARTERY BYPASS GRAFT N/A 04/25/2019   Procedure: CORONARY ARTERY BYPASS GRAFTING (CABG) times four using left internal mammary artery and left greater saphenous vein harvested endoscipically.;  Surgeon: Lajuana Matte, MD;  Location: [place];  Service: Open Heart Surgery;  Laterality: N/A;  . CYSTOSCOPY WITH BIOPSY  05/09/2011   Procedure: CYSTOSCOPY WITH BIOPSY;  Surgeon: Claybon Jabs, MD;  Location: [facility];  Service: Urology;  Laterality: N/A;  WITH BLADDER BIOPSY GYRUS  . LEFT HEART CATH AND CORONARY ANGIOGRAPHY N/A 04/19/2019   Procedure: LEFT HEART CATH AND CORONARY ANGIOGRAPHY;  Surgeon: Leonie Man, MD;  Location: [place] [facility];  Service: Cardiovascular;  Laterality: N/A;  . TOTAL HIP ARTHROPLASTY Right 12/25/2015   Procedure: RIGHT TOTAL HIP ARTHROPLASTY ANTERIOR  APPROACH;  Surgeon: Paralee Cancel, MD;  Location: [facility];  Service: Orthopedics;  Laterality: Right;  . TOTAL KNEE ARTHROPLASTY Left 11/22/2012   Procedure: LEFT TOTAL KNEE ARTHROPLASTY;  Surgeon: Mauri Pole, MD;  Location: [facility];  Service: Orthopedics;  Laterality: Left;  . TRANSURETHRAL RESECTION OF BLADDER TUMOR  03/14/2011   Procedure: TRANSURETHRAL RESECTION OF BLADDER TUMOR (TURBT);  Surgeon: Claybon Jabs, MD;  Location: [facility];  Service: Urology;  Laterality: N/A;  . TRANSURETHRAL RESECTION OF BLADDER TUMOR  05/09/2011   Procedure: TRANSURETHRAL RESECTION OF  BLADDER TUMOR (TURBT);  Surgeon: Claybon Jabs, MD;  Location: [facility];  Service: Urology;  Laterality: N/A;  . TRANSURETHRAL RESECTION OF BLADDER TUMOR  03/08/2012   Procedure: TRANSURETHRAL RESECTION OF BLADDER TUMOR (TURBT);  Surgeon: Claybon Jabs, MD;  Location: [facility];  Service: Urology;  Laterality: N/A;  . TRANSURETHRAL RESECTION OF BLADDER TUMOR WITH GYRUS (TURBT-GYRUS) N/A 03/24/2014   Procedure: TRANSURETHRAL RESECTION OF BLADDER TUMOR WITH GYRUS (TURBT-GYRUS);  Surgeon: Claybon Jabs, MD;  Location: [facility];  Service: Urology;  Laterality: N/A;  . UMBILICAL HERNIA REPAIR  2001  (approx)     Current Outpatient Medications  Medication Sig Dispense Refill  . acetaminophen (TYLENOL) 500 MG tablet Take 2 tablets (1,000 mg total) by mouth every 8 (eight) hours. (Patient taking differently: Take 1,000 mg by mouth at bedtime as needed for headache (pain/sleep). ) 30 tablet 0  . amiodarone (PACERONE) 200 MG tablet Take 1 tablet (200 mg total) by mouth 2 (two) times daily. For 2 days, then decrease to 200 mg daily 60 tablet 1  . apixaban (ELIQUIS) 5 MG TABS tablet Take 1 tablet (5 mg total) by mouth 2 (two) times daily. 60 tablet 6  . aspirin EC 81 MG EC tablet Take 1 tablet (81 mg total) by mouth daily.    [name] b complex vitamins tablet Take 1 tablet by mouth daily after breakfast.     . betamethasone valerate (VALISONE) 0.1 % cream Apply 1 application topically at bedtime. Apply externally to vaginal area    . cholecalciferol (VITAMIN D) 1000 UNITS tablet Take 1,000 Units by mouth daily after breakfast.     . clindamycin (CLEOCIN) 2 % vaginal cream Place 1 Applicatorful vaginally at bedtime.    [name] Co-Enzyme Q10 200 MG CAPS Take 200 mg by mouth daily after breakfast.     . digoxin (LANOXIN) 0.125 MG tablet Take 1 tablet (0.125 mg total) by mouth daily. 30 tablet 3  . Flaxseed, Linseed, (FLAX SEED OIL) 1000 MG CAPS Take 1,000 mg by mouth  daily after breakfast.     . furosemide (LASIX) 40 MG tablet Take 1 tablet (40 mg total) by mouth daily. 30 tablet 1  . gabapentin (NEURONTIN) 100 MG capsule Take 1 capsule (100 mg total) by mouth at bedtime. 90 capsule 3  . Magnesium 300 MG CAPS Take 300 mg by mouth at bedtime.     . metoprolol tartrate (LOPRESSOR) 50 MG tablet TAKE (1) TABLET TWICE A DAY. (Patient taking differently: Take 50 mg by mouth 2 (two) times daily. TAKE (1) TABLET TWICE A DAY.) 180 tablet 3  . nitroGLYCERIN (NITROSTAT) 0.4 MG SL tablet PLACE 1 TABLET UNDER THE TONGUE AT ONSET OF CHEST PAIN EVERY 5 MINTUES UP TO 3 TIMES AS NEEDED (Patient taking differently: Place 0.4 mg under the tongue every 5 (five) minutes as needed for chest pain. ) 25 tablet 0  .  OVER THE COUNTER MEDICATION Take 1 tablet by mouth daily after breakfast. Elderberry/Vitamin C    . potassium chloride 20 MEQ TBCR Take 20 mEq by mouth daily. 30 tablet 1  . traMADol (ULTRAM) 50 MG tablet Take 1-2 tablets (50-100 mg total) by mouth every 4 (four) hours as needed for moderate pain. 30 tablet 0  . TURMERIC PO Take 1 capsule by mouth daily after breakfast.    . vitamin B-12 (CYANOCOBALAMIN) 500 MCG tablet Take 1 tablet (500 mcg total) by mouth daily. 30 tablet 3   No current facility-administered medications for this visit.   Facility-Administered Medications Ordered in Other Visits  Medication Dose Route Frequency Provider Last Rate Last Admin  . DOBUTamine (DOBUTREX) 1,000 mcg/mL in dextrose 5% 250 mL infusion  20 mcg/kg/min Intravenous Continuous Dorothy Spark, MD 96.4 mL/hr at 12/24/15 0840 20 mcg/kg/min at 12/24/15 0840    Allergies:   Fish allergy, Fish-derived products, Shellfish-derived products, Lasix [furosemide], Macrodantin [nitrofurantoin macrocrystal], Vicodin [hydrocodone-acetaminophen], Zetia [ezetimibe], Codeine, Latex, Niacin, Niaspan [niacin er], Statins, and Sulfa antibiotics    Social History:  The patient  reports that she quit  smoking about 43 years ago. Her smoking use included cigarettes. She has a 2.50 pack-year smoking history. She has never used smokeless tobacco. She reports current alcohol use of about 5.0 standard drinks of alcohol per week. She reports that she does not use drugs.   Family History:  The patient's family history includes Coronary artery disease (age of onset: 35) in her father; Heart attack (age of onset: 21) in her daughter; Heart attack (age of onset: 3) in her mother; Heart disease (age of onset: 56) in her son; Hyperlipidemia in her father, mother, and sister; Osteoporosis in her sister.    ROS: All other systems are reviewed and negative. Unless otherwise mentioned in H&P    PHYSICAL EXAM: VS:  BP 138/64 (BP Location: Left Arm, Patient Position: Sitting, Cuff Size: Normal)   Pulse 82   Ht 5\' 3"  (1.6 m)   Wt 182 lb (82.6 kg)   BMI 32.24 kg/m  , BMI Body mass index is 32.24 kg/m. GEN: Well nourished, well developed, in no acute distress HEENT: normal Neck: no JVD, carotid bruits, or masses Cardiac:RRR; 1/6 systolic murmurs, rubs, or gallops,no edema  Respiratory:  Clear to auscultation bilaterally, normal work of breathing GI: soft, nontender, nondistended, + BS MS: no deformity or atrophy. Well healed sternotomy incision. SVG harvest sites to bilateral LE are well healed as well.  Skin: warm and dry, no rash Neuro:  Strength and sensation are intact Psych: euthymic mood, full affect   EKG: Not completed this office visit.   Recent Labs: 04/15/2019: ALT 45 04/27/2019: Magnesium 2.2; TSH 8.256 05/09/2019: BNP 393.1; BUN 18; Creatinine, Ser 0.94; Hemoglobin 8.7; Platelets 482; Potassium 4.6; Sodium 137    Lipid Panel    Component Value Date/Time   CHOL 227 (H) 04/16/2019 0508   CHOL 291 (H) 02/17/2019 0902   TRIG 131 04/16/2019 0508   TRIG 414 (H) 07/31/2016 0942   HDL 59 04/16/2019 0508   HDL 62 02/17/2019 0902   HDL 49 07/31/2016 0942   CHOLHDL 3.8 04/16/2019 0508    VLDL 26 04/16/2019 0508   LDLCALC 142 (H) 04/16/2019 0508   LDLCALC 185 (H) 02/17/2019 0902   LDLCALC 205 (H) 11/28/2013 1215   LDLDIRECT 193 (H) 10/21/2016 0902      Wt Readings from Last 3 Encounters:  05/30/19 182 lb (82.6 kg)  05/13/19  189 lb (85.7 kg)  05/09/19 189 lb (85.7 kg)      Other studies Reviewed: AAA duplex 06/05/2017 Final Interpretation:  Abdominal Aorta: There is evidence of borderline dilitation of the Mid Abdominal aorta. The largest aortic measurement is 2.4 cm. The largest aortic diameter remains essentially unchanged compared to prior exam. Previous diameter measurement was 2.7 cm obtained on 08/2015.  Upper and lower arterial duplex study 04/19/2019 Summary: Right ABI: Resting right ankle-brachial index indicates moderate right lower extremity arterial disease. Left ABI: Resting left ankle-brachial index is within normal range. No evidence of significant left lower extremity arterial disease. Left Upper Extremity: Doppler waveform obliterate with left radial compression. Doppler waveforms decrease >50% with left ulnar compression.  Cardiac catheterization 04/19/2019  LEFT CORONARY ARTERY  Ost LM lesion is 40% stenosed. Mid LM to Ost LAD lesion is 85% stenosed with 95% stenosed side branch in Ost Cx.  Prox LAD to Mid LAD lesion is 90% stenosed -proximal to 1st Diag; Mid LAD lesion is 80% stenosed after 1st Diag  1st Diag lesion is 10% stenosed with 55% stenosed side branch in Lat 1st Diag.  Prox Cx to Mid Cx lesion is 35% stenosed.  RIGHT CORONARY ARTERY  Ost RCA to Prox RCA lesion is 60% stenosed. Prox RCA lesion is 70% stenosed.  Mid RCA to Dist RCA lesion is 90% stenosed.  Dist RCA lesion is 20% stenosed with 50% stenosed side branch in RPAV. RPAV lesion is 65% stenosed. RPDA lesion is 50% stenosed.  -------------------  LV end diastolic pressure is normal.  SUMMARY  Severe multivessel disease with mild ostial and severe distal LEFT MAIN  disease involving ostial LCx with severe proximal and mid LAD disease as well as ostial and mid RCA diffuse disease.  Normal LVEDP with normal EF by Echocardiogram  Coronary Diagrams  Diagnostic Dominance: Right  Intervention     Transthoracic echocardiogram 04/16/2019 1. Left ventricular ejection fraction, by visual estimation, is 60 to 65%. The left ventricle has normal function. There is no left ventricular hypertrophy. 2. The left ventricle has no regional wall motion abnormalities. 3. Left ventricular diastolic function could not be evaluated. 4. Global right ventricle has normal systolic function.The right ventricular size is normal. Noincrease in right ventricular wall thickness. 5. Left atrial size was mildly dilated. 6. Right atrial size was normal. 7. Mild calcification of the anterior mitral valve leaflet(s). Mild thickening of the anterior mitral valve leaflet(s). Moderate mitral annular calcification. Mild to moderate mitral valve regurgitation. No evidence of mitral stenosis. 8. The tricuspid valve is normal in structure. Tricuspid valve regurgitation is mild. 9. The aortic valve was not well visualized. Aortic valve regurgitation is not visualized. No evidence of aortic valve sclerosis or stenosis. 10. The pulmonic valve was normal in structure. Pulmonic valve regurgitation is not visualized. 11. Normal pulmonary artery systolic pressure. 12. The inferior vena cava is normal in size with greater than 50% respiratory variability, suggesting right atrial pressure of 3 mmHg. 13. Small pericardial effusion. 14. The pericardial effusion is anterior to the right ventricle. 15. There appears to be some degree of aortic stenosis but doppler interrogation of the AV  not done. Recommend repeat limited study of the AV to assess for aortic stenosis   ASSESSMENT AND PLAN:  1. CAD: Hx of multivessel CAD s/p CABG by Dr. Kipp Brood with LIMA to LAD, SVG to mid LAD, SVG to OM, SVG  to PDA. She is healing well. Has not yet started in cardiac rehab. She has  one more appointment with CVTS before being released.  2. Atrial Fib: Heart rate is regular and rate controlled. She is still on Eliquis without bleeding or excessive bruising. She is now on lower dose of amiodarone 200 mg daily.   3.  HL:  She is not on statin due to intolerance.  She was referred to lipid clinic, but has not yet had the appointment scheduled. This will be made for her today.   4. Hypertension:  BP is well controlled.No change in her regimen for now.   Current medicines are reviewed at length with the patient today.  I have spent 20 minutes  dedicated to the care of this patient on the date of this encounter to include pre-visit review of records, assessment, management and diagnostic testing,with shared decision making.  Labs/ tests ordered today include: None  Phill Myron. West Pugh, ANP, AACC   05/30/2019 4:34 PM    [facility] [place] [address] Office [phone] Fax [phone]  Notice: This dictation was prepared with Dragon dictation along with smaller phrase technology. Any transcriptional errors that result from this process are unintentional and may not be corrected upon review.

## 2019-05-30 ENCOUNTER — Other Ambulatory Visit: Payer: Self-pay

## 2019-05-30 ENCOUNTER — Encounter: Payer: Self-pay | Admitting: Adult Health

## 2019-05-30 ENCOUNTER — Ambulatory Visit: Payer: Medicare Other | Admitting: Adult Health

## 2019-05-30 VITALS — BP 138/64 | HR 82 | Ht 63.0 in | Wt 182.0 lb

## 2019-05-30 DIAGNOSIS — E785 Hyperlipidemia, unspecified: Secondary | ICD-10-CM

## 2019-05-30 DIAGNOSIS — I2510 Atherosclerotic heart disease of native coronary artery without angina pectoris: Secondary | ICD-10-CM

## 2019-05-30 DIAGNOSIS — Z951 Presence of aortocoronary bypass graft: Secondary | ICD-10-CM | POA: Diagnosis not present

## 2019-05-30 DIAGNOSIS — I480 Paroxysmal atrial fibrillation: Secondary | ICD-10-CM

## 2019-05-30 DIAGNOSIS — I10 Essential (primary) hypertension: Secondary | ICD-10-CM

## 2019-05-30 NOTE — Patient Instructions (Signed)
Medication Instructions:  Continue current medications  *If you need a refill on your cardiac medications before your next appointment, please call your pharmacy*   Lab Work: None Ordered   Testing/Procedures: None Ordered   Follow-Up: At [facility], you and your health needs are our priority.  As part of our continuing mission to provide you with exceptional heart care, we have created designated Provider Care Teams.  These Care Teams include your primary Cardiologist (physician) and Advanced Practice Providers (APPs -  Physician Assistants and Nurse Practitioners) who all work together to provide you with the care you need, when you need it.  We recommend signing up for the patient portal called "MyChart".  Sign up information is provided on this After Visit Summary.  MyChart is used to connect with patients for Virtual Visits (Telemedicine).  Patients are able to view lab/test results, encounter notes, upcoming appointments, etc.  Non-urgent messages can be sent to your provider as well.   To learn more about what you can do with MyChart, go to [email or web address].    Your next appointment:   3 month(s)  The format for your next appointment:   In Person  Provider:   You may see Minus Breeding, MD or one of the following Advanced Practice Providers on your designated Care Team:    Rosaria Ferries, PA-C  Jory Sims, DNP, ANP  Cadence Kathlen Mody, NP

## 2019-06-16 ENCOUNTER — Other Ambulatory Visit: Payer: Self-pay | Admitting: Thoracic Surgery (Cardiothoracic Vascular Surgery)

## 2019-06-16 DIAGNOSIS — Z951 Presence of aortocoronary bypass graft: Secondary | ICD-10-CM

## 2019-06-17 ENCOUNTER — Encounter: Payer: Self-pay | Admitting: Thoracic Surgery (Cardiothoracic Vascular Surgery)

## 2019-06-17 ENCOUNTER — Ambulatory Visit
Admission: RE | Admit: 2019-06-17 | Discharge: 2019-06-17 | Disposition: A | Discharge status: Home / Self Care | Payer: Medicare Other | Source: Ambulatory Visit | Attending: Thoracic Surgery (Cardiothoracic Vascular Surgery) | Admitting: Thoracic Surgery (Cardiothoracic Vascular Surgery)

## 2019-06-17 ENCOUNTER — Other Ambulatory Visit: Payer: Self-pay

## 2019-06-17 ENCOUNTER — Ambulatory Visit (INDEPENDENT_AMBULATORY_CARE_PROVIDER_SITE_OTHER): Payer: Self-pay | Admitting: Thoracic Surgery (Cardiothoracic Vascular Surgery)

## 2019-06-17 VITALS — BP 170/90 | HR 86 | Temp 97.7°F | Resp 20 | Ht 63.0 in | Wt 180.0 lb

## 2019-06-17 DIAGNOSIS — Z951 Presence of aortocoronary bypass graft: Secondary | ICD-10-CM

## 2019-06-17 DIAGNOSIS — I350 Nonrheumatic aortic (valve) stenosis: Secondary | ICD-10-CM

## 2019-06-17 DIAGNOSIS — R0602 Shortness of breath: Secondary | ICD-10-CM | POA: Diagnosis not present

## 2019-06-17 NOTE — Progress Notes (Signed)
      [address]       [place] [address]             [phone]        Chevon T Brokaw [facility] Medical Record [id number] Date of Birth: [birth year]  Referring: Minus Breeding, MD Primary Care: [name], DO Primary Cardiologist:James Percival Spanish, MD  Reason for visit:   follow-up  History of Present Illness:     Gabriela Smith has been doing well.  She isn't walking much, but has no complaints.  She denies any dyspnea or chest pain.  Physical Exam: BP (!) 170/90   Pulse 86   Temp 97.7 F (36.5 C)   Resp 20   Ht 5\' 3"  (1.6 m)   Wt 180 lb (81.6 kg)   SpO2 93% Comment: RA  BMI 31.89 kg/m   Alert NAD Incision clean.  Sternum stable Abdomen soft, ND trace peripheral edema   Diagnostic Studies & Laboratory data: CXR: small right effusion     Assessment / Plan:   16 female s/p CABG.  Doing well Clear for cardiac rehab Continue surveillance for moderate aortic stenosis   [name] 06/17/2019 12:16 PM

## 2019-06-23 ENCOUNTER — Telehealth: Payer: Self-pay | Admitting: Family Medicine

## 2019-06-23 NOTE — Chronic Care Management (AMB) (Signed)
  Chronic Care Management   Outreach Note  06/23/2019 Name: [name] MRN: [id number] DOB: [birth year]  Gabriela Smith is a 79 y.o. year old female who is a primary care patient of [name], DO. I reached out to [name] by phone today in response to a referral sent by Ms. Halina Andreas Sandin's health plan.     An unsuccessful telephone outreach was attempted today. The patient was referred to the case management team for assistance with care management and care coordination.   Follow Up Plan: The care management team will reach out to the patient again over the next 7 days. If patient returns call to provider office, please advise to call [place] at [phone].  [place], [place] [address] Direct Dial: [phone] [email or web address] Website: [email or web address]

## 2019-06-25 ENCOUNTER — Ambulatory Visit: Payer: Medicare Other | Attending: Internal Medicine

## 2019-06-25 DIAGNOSIS — Z23 Encounter for immunization: Secondary | ICD-10-CM

## 2019-06-25 NOTE — Progress Notes (Signed)
   Covid-19 Vaccination Clinic  Name:  Gabriela Smith    MRN: [id number] DOB: 25-Jul-1940  06/25/2019  [name] was observed post Covid-19 immunization for 15 minutes without incident. She was provided with Vaccine Information Sheet and instruction to access the V-Safe system.   [name] was instructed to call 911 with any severe reactions post vaccine: [name] Difficulty breathing  . Swelling of face and throat  . A fast heartbeat  . A bad rash all over body  . Dizziness and weakness   Immunizations Administered    Name Date Dose VIS Date Route   Pfizer COVID-19 Vaccine 06/25/2019 10:08 AM 0.3 mL 02/18/2019 Intramuscular   Manufacturer: [place]   Lot: [id number]   [place]: [id number]

## 2019-06-27 NOTE — Chronic Care Management (AMB) (Signed)
  Chronic Care Management   Note  06/27/2019 Name: Gabriela Smith MRN: [id number] DOB: 29-Jul-1940  GLAYDS INSCO is a 79 y.o. year old female who is a primary care patient of [name], DO. I reached out to [name] by phone today in response to a referral sent by Ms. Halina Andreas Cantara's health plan.     Ms. Karg was given information about Chronic Care Management services today including:  1. CCM service includes personalized support from designated clinical staff supervised by her physician, including individualized plan of care and coordination with other care providers 2. 24/7 contact phone numbers for assistance for urgent and routine care needs. 3. Service will only be billed when office clinical staff spend 20 minutes or more in a month to coordinate care. 4. Only one practitioner may furnish and bill the service in a calendar month. 5. The patient may stop CCM services at any time (effective at the end of the month) by phone call to the office staff. 6. The patient will be responsible for cost sharing (co-pay) of up to 20% of the service fee (after annual deductible is met).  Patient agreed to services and verbal consent obtained.   Follow up plan: Telephone appointment with care management team member scheduled for: 07/06/2019.  [place], [place] [address] Direct Dial: [phone] [email or web address] Website: [email or web address]

## 2019-06-29 ENCOUNTER — Other Ambulatory Visit: Payer: Self-pay | Admitting: Physician Assistant

## 2019-07-04 ENCOUNTER — Encounter: Payer: Self-pay | Admitting: Internal Medicine

## 2019-07-04 ENCOUNTER — Telehealth (INDEPENDENT_AMBULATORY_CARE_PROVIDER_SITE_OTHER): Payer: Medicare Other | Admitting: Internal Medicine

## 2019-07-04 ENCOUNTER — Telehealth: Payer: Self-pay | Admitting: Internal Medicine

## 2019-07-04 VITALS — BP 149/66 | HR 70 | Temp 97.3°F | Ht 63.0 in | Wt 177.0 lb

## 2019-07-04 DIAGNOSIS — E785 Hyperlipidemia, unspecified: Secondary | ICD-10-CM | POA: Diagnosis not present

## 2019-07-04 DIAGNOSIS — M791 Myalgia, unspecified site: Secondary | ICD-10-CM | POA: Diagnosis not present

## 2019-07-04 DIAGNOSIS — T466X5A Adverse effect of antihyperlipidemic and antiarteriosclerotic drugs, initial encounter: Secondary | ICD-10-CM | POA: Diagnosis not present

## 2019-07-04 DIAGNOSIS — I2510 Atherosclerotic heart disease of native coronary artery without angina pectoris: Secondary | ICD-10-CM

## 2019-07-04 DIAGNOSIS — Z951 Presence of aortocoronary bypass graft: Secondary | ICD-10-CM

## 2019-07-04 MED ORDER — REPATHA SURECLICK 140 MG/ML ~~LOC~~ SOAJ: 1.0000 | SUBCUTANEOUS | 11 refills | Status: DC

## 2019-07-04 NOTE — Patient Instructions (Signed)
Medication Instructions:  Dr. Debara Pickett recommends Repatha 140mg /mL (PCSK9). This is an injectable cholesterol medication self-administered once every 14 days. This medication will need prior approval with your insurance company, which we will work on. If the medication is not approved initially, we may need to do an appeal with your insurance. We will keep you updated on this process.   Administer medication in area of fatty tissue such as abdomen, outer thigh, back up of arm - and rotate site with each injection Store medication in refrigerator until ready to administer - allow to sit at room temp for 30 mins - 1 hour prior to injection Dispose of medication in a SHARPS container - your pharmacy should be able to direct you on this and proper disposal   If you need co-pay assistance, please look into the program at healthwellfoundation.org >> disease funds >> hypercholesterolemia. This is an online application or you can call to complete.   If you need a co-pay card for Repatha: [email or web address] >> paying for Repatha If you need a co-pay card for Praluent: WedMap.it >> starting & paying for Praluent  *If you need a refill on your cardiac medications before your next appointment, please call your pharmacy*   Lab Work: FASTING lab work in 3-4 months to check cholesterol  Please complete about 1 week before your next lipid clinic appointment   If you have labs (blood work) drawn today and your tests are completely normal, you will receive your results only by: [name] MyChart Message (if you have MyChart) OR . A paper copy in the mail If you have any lab test that is abnormal or we need to change your treatment, we will call you to review the results.   Testing/Procedures: NONE   Follow-Up: At [facility], you and your health needs are our priority.  As part of our continuing mission to provide you with exceptional heart care, we have created designated Provider Care Teams.  These Care Teams  include your primary Cardiologist (physician) and Advanced Practice Providers (APPs -  Physician Assistants and Nurse Practitioners) who all work together to provide you with the care you need, when you need it.  We recommend signing up for the patient portal called "MyChart".  Sign up information is provided on this After Visit Summary.  MyChart is used to connect with patients for Virtual Visits (Telemedicine).  Patients are able to view lab/test results, encounter notes, upcoming appointments, etc.  Non-urgent messages can be sent to your provider as well.   To learn more about what you can do with MyChart, go to [email or web address].    Your next appointment:   3-4 month(s) - lipid clinic  The format for your next appointment:   Either In Person or Virtual  Provider:   K. Mali Hilty, MD   Other Instructions

## 2019-07-04 NOTE — Telephone Encounter (Signed)
Attempted PA for Repatha via CMM  This medication or product was previously approved on [id number] from 2019-03-11 to 2020-03-09. **Please note: Formulary lowering, tiering exception, cost reduction and/or pre-benefit determination review (including prospective Medicare hospice reviews) requests cannot be requested using this method of submission. Please contact us at [phone] instead  Rx sent to pharmacy Patient notified via [place]

## 2019-07-04 NOTE — Progress Notes (Signed)
Virtual Visit via Telephone Note   This visit type was conducted due to national recommendations for restrictions regarding the COVID-19 Pandemic (e.g. social distancing) in an effort to limit this patient's exposure and mitigate transmission in our community.  Due to her co-morbid illnesses, this patient is at least at moderate risk for complications without adequate follow up.  This format is felt to be most appropriate for this patient at this time.  The patient did not have access to video technology/had technical difficulties with video requiring transitioning to audio format only (telephone).  All issues noted in this document were discussed and addressed.  No physical exam could be performed with this format.  Please refer to the patient's chart for her  consent to telehealth for [facility].   Evaluation Performed:  Telephone consult  Date:  07/04/2019   ID:  Gabriela Smith, DOB 1941-01-27, MRN [id number]  Patient Location:  [address] [place] [address]  Provider location:   [address], [place], [place] [address]  PCP:  [name], DO  Cardiologist:  Minus Breeding, MD Electrophysiologist:  None   Chief Complaint:  Manage dyslipidemia, statin intolerant  History of Present Illness:    Gabriela Smith is a 79 y.o. female who presents via audio/video conferencing for a telehealth visit today. This is a pleasant 79 year old female patient of Dr. Percival Spanish who unfortunately underwent coronary artery bypass grafting in February of this year. She has a strong family history of coronary artery disease, including a son and a daughter who both had early onset heart disease in their 51s and both parents who had heart disease. She has had difficult to control dyslipidemia, and unfortunately has been multistatin intolerant and intolerant to ezetimibe due to significant myalgias. Post surgery she was referred to the lipid clinic for evaluation and management  options. Overall she says she is feeling better and seems to be improving since her surgery however continues to have problems with her feet causing pain, weakness and heaviness.  The patient does not have symptoms concerning for COVID-19 infection (fever, chills, cough, or new SHORTNESS OF BREATH).    Prior CV studies:   The following studies were reviewed today:  Chart reviewed, labs  PMHx:  Past Medical History:  Diagnosis Date  . AAA (abdominal aortic aneurysm) [facility])    cardiology aware   . Arthritis    OA lt knee- cortizone inj. q 4 months AND OA ALSO IN BACK. Had knee replaced-issue resolved  . CAD (coronary artery disease) CARDIOLOGIST- DR [facility]--  VISIT 01-02-10 IN EPIC   1993-- PTCA. Pt describes a near total blockage of apparently the LAD and a 65% stenosis elsewhere.  . Cancer ([place])   . History of bladder cancer followed by dr Karsten Ro   hx  TCC of bladder ,  Ta G3-  first occurence 02-20-2012--  s/p BCG tx's  and  mitomycin C  . Hyperlipidemia 1993  . Hypertension 1993  . Nocturia   . Osteopenia   . Sigmoid diverticulosis     Past Surgical History:  Procedure Laterality Date  . CHOLECYSTECTOMY  2003 (approx)  . CLIPPING OF ATRIAL APPENDAGE N/A 04/25/2019   Procedure: Clipping Of Atrial Appendage;  Surgeon: Lajuana Matte, MD;  Location: [place];  Service: Open Heart Surgery;  Laterality: N/A;  . COLONOSCOPY  05-31-2003  . CORONARY ANGIOPLASTY  1993   to LAD  . CORONARY ARTERY BYPASS GRAFT N/A 04/25/2019   Procedure: CORONARY  ARTERY BYPASS GRAFTING (CABG) times four using left internal mammary artery and left greater saphenous vein harvested endoscipically.;  Surgeon: Lajuana Matte, MD;  Location: [place];  Service: Open Heart Surgery;  Laterality: N/A;  . CYSTOSCOPY WITH BIOPSY  05/09/2011   Procedure: CYSTOSCOPY WITH BIOPSY;  Surgeon: Claybon Jabs, MD;  Location: [facility];  Service: Urology;  Laterality: N/A;  WITH BLADDER BIOPSY GYRUS   . LEFT HEART CATH AND CORONARY ANGIOGRAPHY N/A 04/19/2019   Procedure: LEFT HEART CATH AND CORONARY ANGIOGRAPHY;  Surgeon: Leonie Man, MD;  Location: [place] [facility];  Service: Cardiovascular;  Laterality: N/A;  . TOTAL HIP ARTHROPLASTY Right 12/25/2015   Procedure: RIGHT TOTAL HIP ARTHROPLASTY ANTERIOR APPROACH;  Surgeon: Paralee Cancel, MD;  Location: [facility];  Service: Orthopedics;  Laterality: Right;  . TOTAL KNEE ARTHROPLASTY Left 11/22/2012   Procedure: LEFT TOTAL KNEE ARTHROPLASTY;  Surgeon: Mauri Pole, MD;  Location: [facility];  Service: Orthopedics;  Laterality: Left;  . TRANSURETHRAL RESECTION OF BLADDER TUMOR  03/14/2011   Procedure: TRANSURETHRAL RESECTION OF BLADDER TUMOR (TURBT);  Surgeon: Claybon Jabs, MD;  Location: [facility];  Service: Urology;  Laterality: N/A;  . TRANSURETHRAL RESECTION OF BLADDER TUMOR  05/09/2011   Procedure: TRANSURETHRAL RESECTION OF BLADDER TUMOR (TURBT);  Surgeon: Claybon Jabs, MD;  Location: [facility];  Service: Urology;  Laterality: N/A;  . TRANSURETHRAL RESECTION OF BLADDER TUMOR  03/08/2012   Procedure: TRANSURETHRAL RESECTION OF BLADDER TUMOR (TURBT);  Surgeon: Claybon Jabs, MD;  Location: [facility];  Service: Urology;  Laterality: N/A;  . TRANSURETHRAL RESECTION OF BLADDER TUMOR WITH GYRUS (TURBT-GYRUS) N/A 03/24/2014   Procedure: TRANSURETHRAL RESECTION OF BLADDER TUMOR WITH GYRUS (TURBT-GYRUS);  Surgeon: Claybon Jabs, MD;  Location: [facility];  Service: Urology;  Laterality: N/A;  . UMBILICAL HERNIA REPAIR  2001  (approx)    FAMHx:  Family History  Problem Relation Age of Onset  . Coronary artery disease Father 67  . Hyperlipidemia Father   . Heart attack Mother 77  . Hyperlipidemia Mother   . Osteoporosis Sister   . Hyperlipidemia Sister   . Heart attack Daughter 42       CABG x 5  . Heart disease Son 16       Stents x 3    SOCHx:   reports that she quit  smoking about 43 years ago. Her smoking use included cigarettes. She has a 2.50 pack-year smoking history. She has never used smokeless tobacco. She reports current alcohol use of about 5.0 standard drinks of alcohol per week. She reports that she does not use drugs.  ALLERGIES:  Allergies  Allergen Reactions  . Fish Allergy Hives, Shortness Of Breath and Other (See Comments)    "Bottom-feeder fish"  . Fish-Derived Products Hives, Shortness Of Breath and Other (See Comments)    Bottom-feeder fish  . Shellfish-Derived Products Hives and Shortness Of Breath  . Lasix [Furosemide] Swelling and Other (See Comments)    Edema, elevated BP  . Macrodantin [Nitrofurantoin Macrocrystal] Other (See Comments)    Unknown reaction   . Vicodin [Hydrocodone-Acetaminophen] Nausea And Vomiting and Other (See Comments)    SEVERE N/V  . Zetia [Ezetimibe] Other (See Comments)    Muscle aches  . Codeine Rash  . Latex Itching and Rash    Reports Elastic in underwear, under wire bras, and now surgical cap cause rash and itching.   . Niacin  Other (See Comments)    Muscle aches  . Niaspan [Niacin Er] Other (See Comments)    Muscle aches  . Statins Other (See Comments)    Muscle aches/ cramps  . Sulfa Antibiotics Rash and Other (See Comments)    Muscle aches/ cramps, also    MEDS:  No outpatient medications have been marked as taking for the 07/04/19 encounter (Appointment) with Pixie Casino, MD.     ROS: Pertinent items noted in HPI and remainder of comprehensive ROS otherwise negative.  Labs/Other Tests and Data Reviewed:    Recent Labs: 04/15/2019: ALT 45 04/27/2019: Magnesium 2.2; TSH 8.256 05/09/2019: BNP 393.1; BUN 18; Creatinine, Ser 0.94; Hemoglobin 8.7; Platelets 482; Potassium 4.6; Sodium 137   Recent Lipid Panel Lab Results  Component Value Date/Time   CHOL 227 (H) 04/16/2019 05:08 AM   CHOL 291 (H) 02/17/2019 09:02 AM   TRIG 131 04/16/2019 05:08 AM   TRIG 414 (H) 07/31/2016 09:42 AM    HDL 59 04/16/2019 05:08 AM   HDL 62 02/17/2019 09:02 AM   HDL 49 07/31/2016 09:42 AM   CHOLHDL 3.8 04/16/2019 05:08 AM   LDLCALC 142 (H) 04/16/2019 05:08 AM   LDLCALC 185 (H) 02/17/2019 09:02 AM   LDLCALC 205 (H) 11/28/2013 12:15 PM   LDLDIRECT 193 (H) 10/21/2016 09:02 AM    Wt Readings from Last 3 Encounters:  06/17/19 180 lb (81.6 kg)  05/30/19 182 lb (82.6 kg)  05/13/19 189 lb (85.7 kg)     Exam:    Vital Signs:  There were no vitals taken for this visit.   Exam not performed due to telephone visit  ASSESSMENT & PLAN:    1. CAD status post CABG x4 (LIMA to LAD, SVG to diagonal, SVG to OM and SVG to PDA) 2. Statin and ezetimibe intolerant-myalgias 3. Significant family history of early onset heart disease 4. AAA  5. Atrial fibrillation 6. Hypertension  Ms. Alejos had successful four-vessel CABG in February but has persistently elevated cholesterol. Most recent lipids in February showed total cholesterol 227, HDL 59, triglycerides 131 and LDL 142. As mentioned she cannot tolerate statins or ezetimibe due to myalgias and has significant family history of heart disease. She also has A. fib and an unrepaired AAA. Her target LDL is less than 70 and she is a good candidate for PCSK9 inhibitor. Would recommend Repatha 140 mg every 2 weeks. Plan follow-up with repeat lipids in about 3 months and with me thereafter. Thanks again for the kind referral.   COVID-19 Education: The signs and symptoms of COVID-19 were discussed with the patient and how to seek care for testing (follow up with PCP or arrange E-visit).  The importance of social distancing was discussed today.  Patient Risk:   After full review of this patients clinical status, I feel that they are at least moderate risk at this time.  Time:   Today, I have spent 25 minutes with the patient with telehealth technology discussing dyslipidemia, statin intolerance, CAD and recent CABG, PCSK9 inhibitors.     Medication  Adjustments/Labs and Tests Ordered: Current medicines are reviewed at length with the patient today.  Concerns regarding medicines are outlined above.   Tests Ordered: No orders of the defined types were placed in this encounter.   Medication Changes: No orders of the defined types were placed in this encounter.   Disposition:  in 4 month(s)  Pixie Casino, MD, [place] Director  of the [facility] Lipid Disorders &  Cardiovascular Risk Reduction Clinic Diplomate of the American Board of Clinical Lipidology Attending Cardiologist  Direct Dial: [phone]  Fax: [phone]  Website:  [email or web address]  Pixie Casino, MD  07/04/2019 7:49 AM

## 2019-07-06 ENCOUNTER — Other Ambulatory Visit: Payer: Self-pay

## 2019-07-06 ENCOUNTER — Telehealth: Payer: Self-pay | Admitting: Cardiology

## 2019-07-06 ENCOUNTER — Ambulatory Visit (INDEPENDENT_AMBULATORY_CARE_PROVIDER_SITE_OTHER): Payer: Medicare Other | Admitting: Licensed Clinical Social Worker

## 2019-07-06 DIAGNOSIS — M5136 Other intervertebral disc degeneration, lumbar region: Secondary | ICD-10-CM

## 2019-07-06 DIAGNOSIS — I714 Abdominal aortic aneurysm, without rupture, unspecified: Secondary | ICD-10-CM

## 2019-07-06 DIAGNOSIS — I10 Essential (primary) hypertension: Secondary | ICD-10-CM

## 2019-07-06 DIAGNOSIS — I5031 Acute diastolic (congestive) heart failure: Secondary | ICD-10-CM | POA: Diagnosis not present

## 2019-07-06 DIAGNOSIS — M51369 Other intervertebral disc degeneration, lumbar region without mention of lumbar back pain or lower extremity pain: Secondary | ICD-10-CM

## 2019-07-06 DIAGNOSIS — E785 Hyperlipidemia, unspecified: Secondary | ICD-10-CM | POA: Diagnosis not present

## 2019-07-06 DIAGNOSIS — Z951 Presence of aortocoronary bypass graft: Secondary | ICD-10-CM

## 2019-07-06 NOTE — Telephone Encounter (Signed)
Pt c/o swelling: STAT is pt has developed SOB within 24 hours  1) How much weight have you gained and in what time span? unsure  2) If swelling, where is the swelling located? Legs and feet, mainly left side   3) Are you currently taking a fluid pill? No   4) Are you currently SOB? Just a little bit   5) Do you have a log of your daily weights (if so, list)? No   6) Have you gained 3 pounds in a day or 5 pounds in a week? No, has not weight herself.   7) Have you traveled recently? No

## 2019-07-06 NOTE — Patient Instructions (Addendum)
Licensed Clinical Social Worker Visit Information  Goals we discussed today:  Goals Addressed            This Visit's Progress   . Client will talk with LCSW in next 30 days to discuss client completion of ADLs and dialy tasks (pt-stated)       CARE PLAN ENTRY (see longitudinal plan of care for additional care plan information)  Current Barriers:  Mobility challenges in client with Chronic Diagnoses of HLD, HTN.,OA, DDD, AAA, s/p CABG x 4 Short of breath on occasion  Clinical Social Work Clinical Goal(s):  [name] LCSW to call client in next 30 days to tidsucs client completion of ADLs and daily activities  Interventions: . Talked with client about CCM program support . Talked with client about her mobility challenges . Talked with client about pain issues . Taked with clinet about her breathing challenges (occasional short of breath) . Talked with client about her heart surgery (heart surgery in February of 2021) . Talked with client aobut her upcoming client appointments . Talked with Letta Median about relaxation techniques of choice Foo likes to cook , she likes to listen to music, and likes to read) . Provided counseling support for client  . Talked with Melisia about her family history of heart issues  Talked with Letta Median about health needs of her spouse  Encouraged Ednah to call Triage Nurse at [facility] today to discuss issues of swelling in client's leg  Encouraged Vollie to call RNCM as needed to discuss general nursing needs of client  Patient Self Care Activities:  Takes medications as prescribed Attends scheduled medical appointments  . Patient Self Care Deficits:  [name] Mobility challenges . Shortness of breath occasionally  Initial goal documentation      Materials Provided: No  Follow Up Plan: LCSW to call client in next 4 weeks to talk with her about her completion of daily ADLs and daily activities  The patient verbalized understanding of instructions provided today and  declined a print copy of patient instruction materials.   [name].Nichael Ehly MSW, LCSW Licensed Clinical Social Worker [facility] Care [facility] [phone]

## 2019-07-06 NOTE — Telephone Encounter (Signed)
Spoke with patient. For the last 4 days she has had swelling in her left leg/foot. Her shoes are fitting tight. She has had some mild shortness of breath but isn't sure if it's being caused by her bra or not. Patient is elevating her legs when she is sitting.   Patient quit weighing herself daily because she was feeling better. Patient reports she needs to buy some compression stockings. Patient has been off of her fluid pill for 4 weeks.  Patient advised to elevate legs, wear compression stockings and to weigh herself daily. Patient scheduled in office on Monday for follow up. Patient to call back if she gains 3lbs overnight or 5lbs in a week. Patient to call back if she develops shortness of breath.

## 2019-07-06 NOTE — Chronic Care Management (AMB) (Signed)
Chronic Care Management    Clinical Social Work Follow Up Note  07/06/2019 Name: Gabriela Smith MRN: [id number] DOB: April 30, 1940  Gabriela Smith is a 79 y.o. year old female who is a primary care patient of [name], DO. The CCM team was consulted for assistance with [employer].   Review of patient status, including review of consultants reports, other relevant assessments, and collaboration with appropriate care team members and the patient's provider was performed as part of comprehensive patient evaluation and provision of chronic care management services.    SDOH (Social Determinants of Health) assessments performed: Yes;risk for social isolation; risk for tobacco use; risk for depression; risk for stress     Chronic Care Management from 07/06/2019 in [place]  PHQ-9 Total Score  4     GAD 7 : Generalized Anxiety Score 07/06/2019  Nervous, Anxious, on Edge 0  Control/stop worrying 0  Worry too much - different things 0  Trouble relaxing 0  Restless 1  Easily annoyed or irritable 0  Afraid - awful might happen 0  Total GAD 7 Score 1  Anxiety Difficulty Not difficult at all   Outpatient Encounter Medications as of 07/06/2019  Medication Sig Note  . acetaminophen (TYLENOL) 500 MG tablet Take 2 tablets (1,000 mg total) by mouth every 8 (eight) hours. (Patient taking differently: Take 1,000 mg by mouth at bedtime as needed for headache (pain/sleep). )   . amiodarone (PACERONE) 200 MG tablet Take 1 tablet (200 mg total) by mouth 2 (two) times daily. For 2 days, then decrease to 200 mg daily   . apixaban (ELIQUIS) 5 MG TABS tablet Take 1 tablet (5 mg total) by mouth 2 (two) times daily.   [name] aspirin EC 81 MG EC tablet Take 1 tablet (81 mg total) by mouth daily.   [name] b complex vitamins tablet Take 1 tablet by mouth daily after breakfast.    . betamethasone valerate (VALISONE) 0.1 % cream Apply 1 application topically at bedtime. Apply externally  to vaginal area   . cholecalciferol (VITAMIN D) 1000 UNITS tablet Take 1,000 Units by mouth daily after breakfast.    . clindamycin (CLEOCIN) 2 % vaginal cream Place 1 Applicatorful vaginally at bedtime.   [name] Co-Enzyme Q10 200 MG CAPS Take 200 mg by mouth daily after breakfast.    . digoxin (LANOXIN) 0.125 MG tablet Take 1 tablet (0.125 mg total) by mouth daily.   . Evolocumab (REPATHA SURECLICK) XX123456 MG/ML SOAJ Inject 1 Dose into the skin every 14 (fourteen) days.   . Flaxseed, Linseed, (FLAX SEED OIL) 1000 MG CAPS Take 1,000 mg by mouth daily after breakfast.    . gabapentin (NEURONTIN) 100 MG capsule Take 1 capsule (100 mg total) by mouth at bedtime. 04/15/2019: Pt has been taking for a couple weeks - wondering if this caused the current shortness of breath   . Magnesium 300 MG CAPS Take 300 mg by mouth at bedtime.    . metoprolol tartrate (LOPRESSOR) 50 MG tablet TAKE (1) TABLET TWICE A DAY. (Patient taking differently: Take 50 mg by mouth 2 (two) times daily. TAKE (1) TABLET TWICE A DAY.)   . nitroGLYCERIN (NITROSTAT) 0.4 MG SL tablet PLACE 1 TABLET UNDER THE TONGUE AT ONSET OF CHEST PAIN EVERY 5 MINTUES UP TO 3 TIMES AS NEEDED (Patient taking differently: Place 0.4 mg under the tongue every 5 (five) minutes as needed for chest pain. )   . OVER THE COUNTER MEDICATION Take 1  tablet by mouth daily after breakfast. Elderberry/Vitamin C   . TURMERIC PO Take 1 capsule by mouth daily after breakfast.   . vitamin B-12 (CYANOCOBALAMIN) 500 MCG tablet Take 1 tablet (500 mcg total) by mouth daily.    Facility-Administered Encounter Medications as of 07/06/2019  Medication  . DOBUTamine (DOBUTREX) 1,000 mcg/mL in dextrose 5% 250 mL infusion    Goals Addressed            This Visit's Progress   . Client will talk with LCSW in next 30 days to discuss client completion of ADLs and daily tasks (pt-stated)       [place] (see longitudinal plan of care for additional care plan  information)  Current Barriers:  [name] Mobility challenges in client with Chronic Diagnoses of HLD, HTN, OA, DDD, AAA, s/p CABG x 4 . Short of breath on occasion  Clinical Social Work Clinical Goal(s):  [name] LCSW to call client in next 30 days to discuss client completion of ADLs and daily activities  Interventions: . Talked with client about CCM program support . Talked with client about her mobility challenges . Talked with client about pain issues . Taked with client about her breathing challenges (occasional short of breath) . Talked with client about her heart surgery (heart surgery in February of 2021) . Talked with client aobut her upcoming client appointments . Talked with Letta Median about relaxation techniques of choice [place] likes to cook , she likes to listen to music, and likes to read) . Provided counseling support for client  . Talked with Avanni about her family history of heart issues . Talked with Letta Median about health needs of her spouse . Encouraged Letta Median to call Triage Nurse at [facility] today to discuss issues of swelling in client's leg . Encouraged Letta Median to call RNCM as needed to discuss general nursing needs of client   Patient Self Care Activities:  Takes medications as prescribed Attends scheduled medical appointments  Patient Self Care Deficits:  [name] Mobility challenges . Shortness of breath occasionally  Initial goal documentation       Follow Up Plan: LCSW to call client in next 4 weeks to talk with her about her completion of daily ADLs and daily activities  [name].Roniqua Kintz MSW, LCSW Licensed Clinical Social Worker [facility] [phone]

## 2019-07-07 ENCOUNTER — Other Ambulatory Visit: Payer: Self-pay | Admitting: Physician Assistant

## 2019-07-08 ENCOUNTER — Other Ambulatory Visit: Payer: Self-pay | Admitting: Physician Assistant

## 2019-07-08 ENCOUNTER — Other Ambulatory Visit: Payer: Self-pay | Admitting: *Deleted

## 2019-07-08 NOTE — Telephone Encounter (Signed)
Fax from [place] for Bumetanide Not on current med list Last OV 03/07/19 Next Ov 08/12/19 Please advise

## 2019-07-10 NOTE — Progress Notes (Signed)
Cardiology Clinic Note   Patient Name: Gabriela Smith Date of Encounter: 07/11/2019  Primary Care Provider:  [name], DO Primary Cardiologist:  Minus Breeding, MD  Patient Profile    Gabriela Smith presents to the clinic today for follow-up evaluation of her hypertension, peripheral vascular insufficiency, acute CHF, and new onset atrial fibrillation with RVR (CHA2DS2-VASc equals 4 (age, hypertension, sex)) on amiodarone and apixaban.  Coronary artery disease (CABG x4 04/25/2019 LIMA to LAD, SVG to mid LAD, SVG to OM, SVG to PDA) and lower extremity edema.  Past Medical History    Past Medical History:  Diagnosis Date  . AAA (abdominal aortic aneurysm) [facility])    cardiology aware   . Arthritis    OA lt knee- cortizone inj. q 4 months AND OA ALSO IN BACK. Had knee replaced-issue resolved  . CAD (coronary artery disease) CARDIOLOGIST- DR [facility]--  VISIT 01-02-10 IN EPIC   1993-- PTCA. Pt describes a near total blockage of apparently the LAD and a 65% stenosis elsewhere.  . Cancer ([place])   . History of bladder cancer followed by dr Karsten Ro   hx  TCC of bladder ,  Ta G3-  first occurence 02-20-2012--  s/p BCG tx's  and  mitomycin C  . Hyperlipidemia 1993  . Hypertension 1993  . Nocturia   . Osteopenia   . Sigmoid diverticulosis    Past Surgical History:  Procedure Laterality Date  . CHOLECYSTECTOMY  2003 (approx)  . CLIPPING OF ATRIAL APPENDAGE N/A 04/25/2019   Procedure: Clipping Of Atrial Appendage;  Surgeon: Lajuana Matte, MD;  Location: [place];  Service: Open Heart Surgery;  Laterality: N/A;  . COLONOSCOPY  05-31-2003  . CORONARY ANGIOPLASTY  1993   to LAD  . CORONARY ARTERY BYPASS GRAFT N/A 04/25/2019   Procedure: CORONARY ARTERY BYPASS GRAFTING (CABG) times four using left internal mammary artery and left greater saphenous vein harvested endoscipically.;  Surgeon: Lajuana Matte, MD;  Location: [place];  Service: Open Heart Surgery;  Laterality: N/A;  .  CYSTOSCOPY WITH BIOPSY  05/09/2011   Procedure: CYSTOSCOPY WITH BIOPSY;  Surgeon: Claybon Jabs, MD;  Location: [facility];  Service: Urology;  Laterality: N/A;  WITH BLADDER BIOPSY GYRUS  . LEFT HEART CATH AND CORONARY ANGIOGRAPHY N/A 04/19/2019   Procedure: LEFT HEART CATH AND CORONARY ANGIOGRAPHY;  Surgeon: Leonie Man, MD;  Location: [place] [facility];  Service: Cardiovascular;  Laterality: N/A;  . TOTAL HIP ARTHROPLASTY Right 12/25/2015   Procedure: RIGHT TOTAL HIP ARTHROPLASTY ANTERIOR APPROACH;  Surgeon: Paralee Cancel, MD;  Location: [facility];  Service: Orthopedics;  Laterality: Right;  . TOTAL KNEE ARTHROPLASTY Left 11/22/2012   Procedure: LEFT TOTAL KNEE ARTHROPLASTY;  Surgeon: Mauri Pole, MD;  Location: [facility];  Service: Orthopedics;  Laterality: Left;  . TRANSURETHRAL RESECTION OF BLADDER TUMOR  03/14/2011   Procedure: TRANSURETHRAL RESECTION OF BLADDER TUMOR (TURBT);  Surgeon: Claybon Jabs, MD;  Location: [facility];  Service: Urology;  Laterality: N/A;  . TRANSURETHRAL RESECTION OF BLADDER TUMOR  05/09/2011   Procedure: TRANSURETHRAL RESECTION OF BLADDER TUMOR (TURBT);  Surgeon: Claybon Jabs, MD;  Location[facility];  Service: Urology;  Laterality: N/A;  . TRANSURETHRAL RESECTION OF BLADDER TUMOR  03/08/2012   Procedure: TRANSURETHRAL RESECTION OF BLADDER TUMOR (TURBT);  Surgeon: Claybon Jabs, MD;  Location: [facility];  Service: Urology;  Laterality: N/A;  . TRANSURETHRAL RESECTION OF BLADDER TUMOR WITH  GYRUS (TURBT-GYRUS) N/A 03/24/2014   Procedure: TRANSURETHRAL RESECTION OF BLADDER TUMOR WITH GYRUS (TURBT-GYRUS);  Surgeon: Claybon Jabs, MD;  Location: [facility];  Service: Urology;  Laterality: N/A;  . UMBILICAL HERNIA REPAIR  2001  (approx)    Allergies  Allergies  Allergen Reactions  . Fish Allergy Hives, Shortness Of Breath and Other (See Comments)    "Bottom-feeder fish"  .  Fish-Derived Products Hives, Shortness Of Breath and Other (See Comments)    Bottom-feeder fish  . Shellfish-Derived Products Hives and Shortness Of Breath  . Lasix [Furosemide] Swelling and Other (See Comments)    Edema, elevated BP  . Macrodantin [Nitrofurantoin Macrocrystal] Other (See Comments)    Unknown reaction   . Vicodin [Hydrocodone-Acetaminophen] Nausea And Vomiting and Other (See Comments)    SEVERE N/V  . Zetia [Ezetimibe] Other (See Comments)    Muscle aches  . Codeine Rash  . Latex Itching and Rash    Reports Elastic in underwear, under wire bras, and now surgical cap cause rash and itching.   . Niacin Other (See Comments)    Muscle aches  . Niaspan [Niacin Er] Other (See Comments)    Muscle aches  . Statins Other (See Comments)    Muscle aches/ cramps  . Sulfa Antibiotics Rash and Other (See Comments)    Muscle aches/ cramps, also    History of Present Illness    [name] has a PMH ofhypertension, peripheral vascular insufficiency, acute CHF, and new onset atrial fibrillation with RVR (CHA2DS2-VASc equals 4 (age, hypertension, sex)) on amiodarone and apixaban.  Coronary artery disease (CABG x4 04/25/2019 LIMA to LAD, SVG to mid LAD, SVG to OM, SVG to PDA), statin intolerance, AAA, bladder cancer, HLD, hypertension, CHF, arthritis, and lower extremity edema.  She underwent cardiac catheterization 04/19/2019 that showed severe multivessel disease.  As a result she underwent CABG x4 on 04/25/2019 by Dr. Kipp Brood.  She was seen in the office on 05/09/2019 by Katina Dung, NP.  At that time she indicated she was having lower extremity edema and fatigue.  Her EKG showed rate controlled atrial fibrillation.  DCCV was discussed.  She was referred to lipid clinic for PCSK9 and continued on her Lasix 40 mg daily.  She then presented to the clinic on 05/30/2019 and saw Jory Sims, DNP.  She presented with her daughter.  She denied chest pain and dyspnea with exertion.  She felt she  was slowly getting better and regaining her strength.  She was compliant with her medications and continued to have mild lower extremity edema.  She continued on her low-dose amiodarone.  Her heart rate was regular and well controlled at that time.  She was seen by Dr. Debara Pickett on 07/04/2019 virtually 1 started on Repatha 140 mg every 2 weeks with a plan to repeat her lipid panel in 3 months.  She contacted nurse triage line on 07/06/2019 with complaints of left lower extremity edema and mild increased work of breathing.  She was instructed to elevate her lower extremities, weigh herself daily, and wear lower extremity support stockings.  She presents the clinic today for follow-up and states her lower extremity swelling has started to decrease.  She states she has lost around 5 pounds over the last couple weeks.  She has not been using lower extremity support stockings.  I will give her the aspirate support stockings sheet.  She indicates that she has been trying to stay away from sodium in her diet.  However she  states that she does enjoy pizza.  I have asked her to increase her walking as tolerated and elevate her lower extremities when she is not active.  I will have her follow-up with Dr. Percival Spanish in 3 months.  She denies chest pain, shortness of breath, increased lower extremity edema, fatigue, palpitations, melena, hematuria, hemoptysis, diaphoresis, weakness, presyncope, syncope, orthopnea, and PND.     Home Medications    Prior to Admission medications   Medication Sig Start Date End Date Taking? Authorizing Provider  acetaminophen (TYLENOL) 500 MG tablet Take 2 tablets (1,000 mg total) by mouth every 8 (eight) hours. Patient taking differently: Take 1,000 mg by mouth at bedtime as needed for headache (pain/sleep).  12/26/15   Danae Orleans, PA-C  amiodarone (PACERONE) 200 MG tablet Take 1 tablet (200 mg total) by mouth 2 (two) times daily. For 2 days, then decrease to 200 mg daily 05/02/19    Barrett, Junie Panning R, PA-C  apixaban (ELIQUIS) 5 MG TABS tablet Take 1 tablet (5 mg total) by mouth 2 (two) times daily. 05/02/19   Barrett, Lodema Hong, PA-C  aspirin EC 81 MG EC tablet Take 1 tablet (81 mg total) by mouth daily. 05/02/19   Barrett, Erin R, PA-C  b complex vitamins tablet Take 1 tablet by mouth daily after breakfast.     [provider]  betamethasone valerate (VALISONE) 0.1 % cream Apply 1 application topically at bedtime. Apply externally to vaginal area 03/30/19   [provider]  cholecalciferol (VITAMIN D) 1000 UNITS tablet Take 1,000 Units by mouth daily after breakfast.     [provider]  clindamycin (CLEOCIN) 2 % vaginal cream Place 1 Applicatorful vaginally at bedtime. 04/06/19   [provider]  Co-Enzyme Q10 200 MG CAPS Take 200 mg by mouth daily after breakfast.     [provider]  digoxin (LANOXIN) 0.125 MG tablet Take 1 tablet (0.125 mg total) by mouth daily. 05/02/19   Barrett, Erin R, PA-C  Evolocumab (REPATHA SURECLICK) XX123456 MG/ML SOAJ Inject 1 Dose into the skin every 14 (fourteen) days. 07/04/19   Hilty, Nadean Corwin, MD  Flaxseed, Linseed, (FLAX SEED OIL) 1000 MG CAPS Take 1,000 mg by mouth daily after breakfast.     [provider]  gabapentin (NEURONTIN) 100 MG capsule Take 1 capsule (100 mg total) by mouth at bedtime. 03/07/19   Baruch Gouty, FNP  Magnesium 300 MG CAPS Take 300 mg by mouth at bedtime.     [provider]  metoprolol tartrate (LOPRESSOR) 50 MG tablet TAKE (1) TABLET TWICE A DAY. Patient taking differently: Take 50 mg by mouth 2 (two) times daily. TAKE (1) TABLET TWICE A DAY. 03/01/19   Minus Breeding, MD  nitroGLYCERIN (NITROSTAT) 0.4 MG SL tablet PLACE 1 TABLET UNDER THE TONGUE AT ONSET OF CHEST PAIN EVERY 5 MINTUES UP TO 3 TIMES AS NEEDED Patient taking differently: Place 0.4 mg under the tongue every 5 (five) minutes as needed for chest pain.  11/08/18   [name], DO  OVER THE  COUNTER MEDICATION Take 1 tablet by mouth daily after breakfast. Elderberry/Vitamin C    [provider]  TURMERIC PO Take 1 capsule by mouth daily after breakfast.    [provider]  vitamin B-12 (CYANOCOBALAMIN) 500 MCG tablet Take 1 tablet (500 mcg total) by mouth daily. 05/02/19   Barrett, Lodema Hong, PA-C    Family History    Family History  Problem Relation Age of Onset  . Coronary  artery disease Father 29  . Hyperlipidemia Father   . Heart attack Mother 38  . Hyperlipidemia Mother   . Osteoporosis Sister   . Hyperlipidemia Sister   . Heart attack Daughter 30       CABG x 5  . Heart disease Son 65       Stents x 3   She indicated that her mother is deceased. She indicated that her father is deceased. She indicated that all of her five sisters are alive. She indicated that her daughter is alive. She indicated that both of her sons are alive.  Social History    Social History   Socioeconomic History  . Marital status: Married    Spouse name: Not on file  . Number of children: 3  . Years of education: 71  . Highest education level: High school graduate  Occupational History  . Occupation: retired  Tobacco Use  . Smoking status: Former Smoker    Packs/day: 0.50    Years: 5.00    Pack years: 2.50    Types: Cigarettes    Quit date: 03/09/1976    Years since quitting: 43.3  . Smokeless tobacco: Never Used  Substance and Sexual Activity  . Alcohol use: Yes    Alcohol/week: 5.0 standard drinks    Types: 5 Glasses of wine per week    Comment: red wine  . Drug use: No  . Sexual activity: Not Currently  Other Topics Concern  . Not on file  Social History Narrative   Married with 3 children and 4 grandchildren.  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 with Parkinsons   Social Determinants of Health   Financial Resource Strain: Low Risk   . Difficulty of Paying Living Expenses: Not hard at all  Food Insecurity: No Food Insecurity  . Worried About Charity fundraiser in  the Last Year: Never true  . Ran Out of Food in the Last Year: Never true  Transportation Needs: No Transportation Needs  . Lack of Transportation (Medical): No  . Lack of Transportation (Non-Medical): No  Physical Activity: Sufficiently Active  . Days of Exercise per Week: 7 days  . Minutes of Exercise per Session: 50 min  Stress: No Stress Concern Present  . Feeling of Stress : Not at all  Social Connections: Slightly Isolated  . Frequency of Communication with Friends and Family: More than three times a week  . Frequency of Social Gatherings with Friends and Family: More than three times a week  . Attends Religious Services: More than 4 times per year  . Active Member of Clubs or Organizations: No  . Attends Archivist Meetings: Never  . Marital Status: Married  Human resources officer Violence: Not At Risk  . Fear of Current or Ex-Partner: No  . Emotionally Abused: No  . Physically Abused: No  . Sexually Abused: No     Review of Systems    General:  No chills, fever, night sweats or weight changes.  Cardiovascular:  No chest pain, dyspnea on exertion, edema, orthopnea, palpitations, paroxysmal nocturnal dyspnea. Dermatological: No rash, lesions/masses Respiratory: No cough, dyspnea Urologic: No hematuria, dysuria Abdominal:   No nausea, vomiting, diarrhea, bright red blood per rectum, melena, or hematemesis Neurologic:  No visual changes, wkns, changes in mental status. All other systems reviewed and are otherwise negative except as noted above.  Physical Exam    VS:  BP 140/90   Pulse 79   Temp (!) 97.1 F (36.2 C)   Ht  5\' 3"  (1.6 m)   Wt 181 lb (82.1 kg)   SpO2 96%   BMI 32.06 kg/m  , BMI Body mass index is 32.06 kg/m. GEN: Well nourished, well developed, in no acute distress. HEENT: normal. Neck: Supple, no JVD, carotid bruits, or masses. Cardiac: RRR, no murmurs, rubs, or gallops. No clubbing, cyanosis, bilateral lower extremity nonpitting edema.  Left  greater than right radials/DP/PT 2+ and equal bilaterally.  Bevelyn Buckles' sign negative Respiratory:  Respirations regular and unlabored, clear to auscultation bilaterally. GI: Soft, nontender, nondistended, BS + x 4. MS: no deformity or atrophy. Skin: warm and dry, no rash. Neuro:  Strength and sensation are intact. Psych: Normal affect.  Accessory Clinical Findings    ECG personally reviewed by me today-none today.  EKG 05/09/2019 Atrial fibrillation anterior infarct undetermined age 55 and T wave abnormalities consider inferolateral ischemia 83 bpm  Echocardiogram 04/19/2019 IMPRESSIONS    1. Left ventricular ejection fraction, by estimation, is 60 to 65%. The  left ventricle has normal function. The left ventrical has no regional  wall motion abnormalities. Left ventricular diastolic function could not  be evaluated. Not assessed.  2. Right ventricular systolic function is normal. The right ventricular  size is normal.  3. Left atrial size was mildly dilated.  4. Mild mitral valve regurgitation.  5. Although the mean AVG is low and consistent with mild AS, the Stroke  volume index is low at 27.72 and the dimensionless index is 0.44 more  consistent with moderate AS. This likely represents low flow low gradient  moderate aortic stenosis. . The  aortic valve has an indeterminant number of cusps. Aortic valve  regurgitation is not visualized. Aortic valve area, by VTI measures 1.37  cm. Aortic valve mean gradient measures 11.2 mmHg.  6. The inferior vena cava is normal in size with greater than 50%  respiratory variability, suggesting right atrial pressure of 3 mmHg.  Cardiac catheterization 04/19/2019  LEFT CORONARY ARTERY  Ost LM lesion is 40% stenosed. Mid LM to Ost LAD lesion is 85% stenosed with 95% stenosed side branch in Ost Cx.  Prox LAD to Mid LAD lesion is 90% stenosed -proximal to 1st Diag; Mid LAD lesion is 80% stenosed after 1st Diag  1st Diag lesion is 10%  stenosed with 55% stenosed side branch in Lat 1st Diag.  Prox Cx to Mid Cx lesion is 35% stenosed.  RIGHT CORONARY ARTERY  Ost RCA to Prox RCA lesion is 60% stenosed. Prox RCA lesion is 70% stenosed.  Mid RCA to Dist RCA lesion is 90% stenosed.  Dist RCA lesion is 20% stenosed with 50% stenosed side branch in RPAV. RPAV lesion is 65% stenosed. RPDA lesion is 50% stenosed.  -------------------  LV end diastolic pressure is normal.   SUMMARY  Severe multivessel disease with mild ostial and severe distal LEFT MAIN disease involving ostial LCx with severe proximal and mid LAD disease as well as ostial and mid RCA diffuse disease.  Normal LVEDP with normal EF by Echocardiogram   RECOMMENDATIONS  Patient be transferred to [place] and we will consult CVTS for LEFT MAIN disease  She is hemodynamically stable, therefore would not place IABP, however she has recurrent chest pain, will need urgent IVP and possible urgent CABG  We will start IV heparin 8 hours after sheath removal.  Eliquis will not be restarted.  Continue other cardiac risk factor management with blood pressure, and lipid control.  She does appear to be adequately diuresed based on LVEDP  Diagnostic Dominance: Right  Intervention    Assessment & Plan   1.  Lower extremity edema-left lower extremity edema generalized nonpitting. Heart healthy low-sodium diet-salty 6 given Lower extremity support stockings-prescription given Daily weights Increase physical activity as tolerated Continue weight log  Coronary artery disease-no chest pain today. Continue aspirin Continue metoprolol Continue nitroglycerin sublingual as needed Heart healthy low-sodium diet Increase physical activity as tolerated   Atrial fibrillation-heart rate today 79 bpm Continue amiodarone Continue metoprolol Continue apixaban Continue digoxin Avoid triggers caffeine, chocolate, EtOH etc. Heart healthy low-sodium diet Increase  physical activity as tolerated  Hyperlipidemia-started on Repatha Heart healthy low-sodium high-fiber diet Increase physical activity as tolerated Followed with Dr. Debara Pickett  Essential hypertension-BP today 140/90.  Well-controlled at home Continue metoprolol 50 mg twice daily Heart healthy low-sodium diet-salty 6 given Increase physical activity as tolerated   Disposition: Follow-up with Dr. Percival Spanish in 3 months.   Jossie Ng. Alanmichael Barmore NP-C    07/11/2019, 3:35 PM [address] Office [phone] Fax [phone]

## 2019-07-11 ENCOUNTER — Encounter: Payer: Self-pay | Admitting: General Practice

## 2019-07-11 ENCOUNTER — Other Ambulatory Visit: Payer: Self-pay

## 2019-07-11 ENCOUNTER — Ambulatory Visit: Payer: Medicare Other | Admitting: General Practice

## 2019-07-11 VITALS — BP 140/90 | HR 79 | Temp 97.1°F | Ht 63.0 in | Wt 181.0 lb

## 2019-07-11 DIAGNOSIS — R600 Localized edema: Secondary | ICD-10-CM | POA: Diagnosis not present

## 2019-07-11 DIAGNOSIS — I2510 Atherosclerotic heart disease of native coronary artery without angina pectoris: Secondary | ICD-10-CM | POA: Diagnosis not present

## 2019-07-11 DIAGNOSIS — I480 Paroxysmal atrial fibrillation: Secondary | ICD-10-CM | POA: Diagnosis not present

## 2019-07-11 NOTE — Patient Instructions (Signed)
Medication Instructions:  Your physician recommends that you continue on your current medications as directed. Please refer to the Current Medication list given to you today.  *If you need a refill on your cardiac medications before your next appointment, please call your pharmacy*  Follow-Up: At [facility], you and your health needs are our priority.  As part of our continuing mission to provide you with exceptional heart care, we have created designated Provider Care Teams.  These Care Teams include your primary Cardiologist (physician) and Advanced Practice Providers (APPs -  Physician Assistants and Nurse Practitioners) who all work together to provide you with the care you need, when you need it.  We recommend signing up for the patient portal called "MyChart".  Sign up information is provided on this After Visit Summary.  MyChart is used to connect with patients for Virtual Visits (Telemedicine).  Patients are able to view lab/test results, encounter notes, upcoming appointments, etc.  Non-urgent messages can be sent to your provider as well.   To learn more about what you can do with MyChart, go to [email or web address].    Your next appointment:   3 month(s)  The format for your next appointment:   In Person  Provider:   Minus Breeding, MD    Other Instructions  Denyse Amass FNP recommends the following: - increase phyiscal activity - monitor sodium/salt in your diet - KNEE HIGH COMPRESSION STOCKINGS      -- 20-30 mmHg (compression strength)     -- [facility]  -- [address]  -- [place]. [place]   -- [facility]  -- [address] [place]  -- [phone]   How to Use Compression Stockings Compression stockings are elastic socks that squeeze the legs. They help to increase blood flow to the legs, decrease swelling in the legs,  and reduce the chance of developing blood clots in the lower legs. Compression stockings are often used by people who:  Are recovering from surgery.  Have poor circulation in their legs.  Are prone to getting blood clots in their legs.  Have varicose veins.  Sit or stay in bed for long periods of time. How to use compression stockings Before you put on your compression stockings:  Make sure that they are the correct size. If you do not know your size, ask your health care provider.  Make sure that they are clean, dry, and in good condition.  Check them for rips and tears. Do not put them on if they are ripped or torn. Put your stockings on first thing in the morning, before you get out of bed. Keep them on for as long as your health care provider advises. When you are wearing your stockings:  Keep them as smooth as possible. Do not allow them to bunch up. It is especially important to prevent the stockings from bunching up around your toes or behind your knees.  Do not roll the stockings downward and leave them rolled down. This can decrease blood flow to your leg.  Change them right away if they become wet or dirty. When you take off your stockings, inspect your legs and feet. Anything that does not seem normal may require medical attention. Look for:  Open sores.  Red spots.  Swelling. Information and tips  Do not stop wearing your compression stockings without talking to  your health care provider first.  Wash your stockings every day with mild detergent in cold or warm water. Do not use bleach. Air-dry your stockings or dry them in a clothes dryer on low heat.  Replace your stockings every 3-6 months.  If skin moisturizing is part of your treatment plan, apply lotion or cream at night so that your skin will be dry when you put on the stockings in the morning. It is harder to put the stockings on when you have lotion on your legs or feet. Contact a health care provider  if: Remove your stockings and seek medical care if:  You have a feeling of pins and needles in your feet or legs.  You have any new changes in your skin.  You have skin lesions that are getting worse.  You have swelling or pain that is getting worse. Get help right away if:  You have numbness or tingling in your lower legs that does not get better right after you take the stockings off.  Your toes or feet become cold and blue.  You develop open sores or red spots on your legs that do not go away.  You see or feel a warm spot on your leg.  You have new swelling or soreness in your leg.  You are short of breath or you have chest pain for no reason.  You have a rapid or irregular heartbeat.  You feel light-headed or dizzy. This information is not intended to replace advice given to you by your health care provider. Make sure you discuss any questions you have with your health care provider. Document Released: 12/22/2008 Document Revised: 07/25/2015 Document Reviewed: 02/01/2014 Elsevier Interactive Patient Education  2017 [employer].

## 2019-07-11 NOTE — Telephone Encounter (Signed)
Can you reach out to patient to see why she is needing?  This appears to have been prescribed by specialists in the past.  Recommend sending refill request to original prescriber.

## 2019-07-11 NOTE — Telephone Encounter (Signed)
Called pt - states that she is no longer taking this  [employer] called and aware - jhb

## 2019-07-21 ENCOUNTER — Telehealth: Payer: Self-pay | Admitting: Cardiology

## 2019-07-21 NOTE — Telephone Encounter (Signed)
LMTCB

## 2019-07-21 NOTE — Telephone Encounter (Signed)
° °  Pt c/o medication issue:  1. Name of Medication:   Evolocumab (REPATHA SURECLICK) XX123456 MG/ML SOAJ    2. How are you currently taking this medication (dosage and times per day)? Inject 1 Dose into the skin every 14 (fourteen) days.  3. Are you having a reaction (difficulty breathing--STAT)?   4. What is your medication issue? Pt said she received a mail today stating she no longer approved to get a refill of this medication. She wanted to speak with a nurse what she needs to do

## 2019-07-27 ENCOUNTER — Ambulatory Visit (INDEPENDENT_AMBULATORY_CARE_PROVIDER_SITE_OTHER): Payer: Medicare Other | Admitting: Licensed Clinical Social Worker

## 2019-07-27 ENCOUNTER — Ambulatory Visit: Payer: Medicare Other | Admitting: *Deleted

## 2019-07-27 DIAGNOSIS — I714 Abdominal aortic aneurysm, without rupture, unspecified: Secondary | ICD-10-CM

## 2019-07-27 DIAGNOSIS — Z951 Presence of aortocoronary bypass graft: Secondary | ICD-10-CM

## 2019-07-27 DIAGNOSIS — I5031 Acute diastolic (congestive) heart failure: Secondary | ICD-10-CM | POA: Diagnosis not present

## 2019-07-27 DIAGNOSIS — E785 Hyperlipidemia, unspecified: Secondary | ICD-10-CM

## 2019-07-27 DIAGNOSIS — M5136 Other intervertebral disc degeneration, lumbar region: Secondary | ICD-10-CM

## 2019-07-27 DIAGNOSIS — I10 Essential (primary) hypertension: Secondary | ICD-10-CM

## 2019-07-27 NOTE — Telephone Encounter (Signed)
Looks like Dr. Debara Pickett is following his lipids

## 2019-07-27 NOTE — Chronic Care Management (AMB) (Signed)
  Chronic Care Management   Care Coordination Note   07/27/2019 Name: Gabriela Smith MRN: [id number] DOB: 04-22-1940  Referred by: [name], DO Reason for referral : Chronic Care Management (Care Coordination)   Gabriela Smith is a 79 y.o. year old female who is a primary care patient of [name], DO. The CCM team was consulted for assistance with chronic disease management and care coordination needs.     RN Care Plan   . Chronic Disease Managment Needs       CARE PLAN ENTRY (see longitudinal plan of care for additional care plan information)  Current Barriers:  . Chronic Disease Management support and education needs related to AAA, CAD, CHF, DDD, OA, HTN, HLD  Nurse Case Manager Clinical Goal(s):  [name] Over the next 30 days, patient will work with Consulting civil engineer to address needs related to AAA, CAD, CHF, DDD, OA, HTN, HLD  Interventions:  . Inter-disciplinary care team collaboration (see longitudinal plan of care) . Consulted by Theadore Nan, LCSW who spoke with patient today o Patient is having difficulty with compression hose o Recent visit with cardio NP, [name] . Chart reviewed including recent cardiology office notes o Recommended compression hose o Handout given with info on compression hose o Script was given along with information on where she can purchase them . Appt scheduled with RN Care Manager for 08/02/19 . Appt with PCP is scheduled for 08/12/19  Patient Self Care Activities:  . Performs ADL's independently . Performs IADL's independently  Initial goal documentation         Plan:   The care management team will reach out to the patient again over the next 30 days.     Chong Sicilian, BSN, RN-BC Embedded Chronic Care Manager [facility] Direct Dial: [phone]

## 2019-07-27 NOTE — Chronic Care Management (AMB) (Signed)
Chronic Care Management    Clinical Social Work Follow Up Note  07/27/2019 Name: Gabriela Smith MRN: [id number] DOB: 1941-01-06  Gabriela Smith is a 79 y.o. year old female who is a primary care patient of [name], DO. The CCM team was consulted for assistance with [employer] .   Review of patient status, including review of consultants reports, other relevant assessments, and collaboration with appropriate care team members and the patient's provider was performed as part of comprehensive patient evaluation and provision of chronic care management services.    SDOH (Social Determinants of Health) assessments performed: Yes; risk for social isolation; risk for tobacco use    Chronic Care Management from 07/06/2019 in [place]  PHQ-9 Total Score  4     GAD 7 : Generalized Anxiety Score 07/06/2019  Nervous, Anxious, on Edge 0  Control/stop worrying 0  Worry too much - different things 0  Trouble relaxing 0  Restless 1  Easily annoyed or irritable 0  Afraid - awful might happen 0  Total GAD 7 Score 1  Anxiety Difficulty Not difficult at all    Outpatient Encounter Medications as of 07/27/2019  Medication Sig Note  . acetaminophen (TYLENOL) 500 MG tablet Take 2 tablets (1,000 mg total) by mouth every 8 (eight) hours. (Patient taking differently: Take 1,000 mg by mouth at bedtime as needed for headache (pain/sleep). )   . amiodarone (PACERONE) 200 MG tablet Take 1 tablet (200 mg total) by mouth 2 (two) times daily. For 2 days, then decrease to 200 mg daily   . apixaban (ELIQUIS) 5 MG TABS tablet Take 1 tablet (5 mg total) by mouth 2 (two) times daily.   [name] aspirin EC 81 MG EC tablet Take 1 tablet (81 mg total) by mouth daily.   [name] b complex vitamins tablet Take 1 tablet by mouth daily after breakfast.    . betamethasone valerate (VALISONE) 0.1 % cream Apply 1 application topically at bedtime. Apply externally to vaginal area   . cholecalciferol  (VITAMIN D) 1000 UNITS tablet Take 1,000 Units by mouth daily after breakfast.    . clindamycin (CLEOCIN) 2 % vaginal cream Place 1 Applicatorful vaginally at bedtime.   [name] Co-Enzyme Q10 200 MG CAPS Take 200 mg by mouth daily after breakfast.    . digoxin (LANOXIN) 0.125 MG tablet Take 1 tablet (0.125 mg total) by mouth daily.   . Evolocumab (REPATHA SURECLICK) XX123456 MG/ML SOAJ Inject 1 Dose into the skin every 14 (fourteen) days.   . Flaxseed, Linseed, (FLAX SEED OIL) 1000 MG CAPS Take 1,000 mg by mouth daily after breakfast.    . gabapentin (NEURONTIN) 100 MG capsule Take 1 capsule (100 mg total) by mouth at bedtime. 04/15/2019: Pt has been taking for a couple weeks - wondering if this caused the current shortness of breath   . Magnesium 300 MG CAPS Take 300 mg by mouth at bedtime.    . metoprolol tartrate (LOPRESSOR) 50 MG tablet TAKE (1) TABLET TWICE A DAY. (Patient taking differently: Take 50 mg by mouth 2 (two) times daily. TAKE (1) TABLET TWICE A DAY.)   . nitroGLYCERIN (NITROSTAT) 0.4 MG SL tablet PLACE 1 TABLET UNDER THE TONGUE AT ONSET OF CHEST PAIN EVERY 5 MINTUES UP TO 3 TIMES AS NEEDED (Patient taking differently: Place 0.4 mg under the tongue every 5 (five) minutes as needed for chest pain. )   . OVER THE COUNTER MEDICATION Take 1 tablet by mouth daily  after breakfast. Elderberry/Vitamin C   . TURMERIC PO Take 1 capsule by mouth daily after breakfast.   . vitamin B-12 (CYANOCOBALAMIN) 500 MCG tablet Take 1 tablet (500 mcg total) by mouth daily.    Facility-Administered Encounter Medications as of 07/27/2019  Medication  . DOBUTamine (DOBUTREX) 1,000 mcg/mL in dextrose 5% 250 mL infusion     Goals Addressed            This Visit's Progress   . Client will talk with LCSW in next 30 days to discuss client completion of ADLs and dialy tasks (pt-stated)       CARE PLAN ENTRY (see longitudinal plan of care for additional care plan information)  Current Barriers:  [name] Mobility challenges  in client with Chronic Diagnoses of CHF, HLD, HTN, OA, DDD, Vitamin D deficiency, AAA, s/p CABG X 4 . Short of breath on occasion   Clinical Social Work Clinical Goal(s):  [name] LCSW to call client in next 30 days to tidsucs client completion of ADLs and daily activities  Interventions: . Talked with client about her mobility challenges . Talked with client about pain issues . Taked with clinet about her breathing challenges (occasional short of breath) . Talked with client about her heart surgery (heart surgery in February of 2021) . Talked with client aobut her upcoming client appointments . Talked with Letta Median about relaxation techniques of choice Kurzawa likes to cook , she likes to listen to music, and likes to read) . Provided counseling support for client  . Talked with Taaliyah about her family history of heart issues . Talked with Ramiro about ambulation challenges and her walking for exercise . Talked with Kadaisha about swelling in her feet  . Talked with client about her completion of ADLs . Talked with client about health needs of her spouse . Talked with client about sleeping issues of client . Collaborated with RNCM to discuss nursing needs of client  Patient Self Care Activities:  Takes medications as prescribed Attends scheduled medical appointments  . Patient Self Care Deficits:  [name] Mobility challenges . Shortness of breath occasionally  Initial goal documentation       Follow Up Plan: LCSW to call client in next 4 weeks to talk with her about her completion of daily ADLs and daily activities  [name].Yeraldin Litzenberger MSW, LCSW Licensed Clinical Social Worker [facility] [phone]

## 2019-07-27 NOTE — Patient Instructions (Addendum)
Licensed Clinical Social Worker Visit Information  Goals we discussed today:  Goals Addressed            This Visit's Progress   . Client will talk with LCSW in next 30 days to discuss client completion of ADLs and dialy tasks (pt-stated)       CARE PLAN ENTRY   Current Barriers:  [name] Mobility challenges in client with Chronic Diagnoses of CHF, HLD, HTN, OA, DDD, Vitamin D deficiency, AAA, s/p CABG X 4 . Short of breath on occasion   Clinical Social Work Clinical Goal(s):  [name] LCSW to call client in next 30 days to tidsucs client completion of ADLs and daily activities  Interventions: Talked with client about her mobility challenges  Talked with client about pain issues  Taked with clinet about her breathing challenges (occasional short of breath)  Talked with client about her heart surgery (heart surgery in February of 2021)  Talked with client aobut her upcoming client appointments  Talked with Letta Median about relaxation techniques of choice Reen likes to [employer] , she likes to listen to music, and likes to read)  Provided counseling support for client  Talked with Kalyna about her family history of heart issues  Talked with Hamida about ambulation challenges and her walking for exercise  Talked with Letta Median about swelling in her feet  Talked with client about her completion of ADLs  Talked with client about health needs of her spouse  Talked with client about sleeping issues of client  Collaborated with RNCM to discuss nursing needs of client  Patient Self Care Activities:  Takes medications as prescribed Attends scheduled medical appointments  . Patient Self Care Deficits:  [name] Mobility challenges . Shortness of breath occasionally  Initial goal documentation       Materials Provided: No  Follow Up Plan:LCSW to call client in next 4 weeks to talk with her about her completion of daily ADLs and daily activities  The patient verbalized understanding of instructions provided today  and declined a print copy of patient instruction materials.   [name].Jani Ploeger MSW, LCSW Licensed Clinical Social Worker [facility] [phone]

## 2019-07-28 NOTE — Telephone Encounter (Signed)
Spoke with [employer]. They have medication ready for patient, there is no issues with her getting a refill, no PA required at this time. They state patient have have requested a refill a little too early for insurance.   LM for patient with this info.

## 2019-07-29 ENCOUNTER — Other Ambulatory Visit: Payer: Self-pay | Admitting: Physician Assistant

## 2019-08-02 ENCOUNTER — Other Ambulatory Visit: Payer: Self-pay

## 2019-08-02 ENCOUNTER — Ambulatory Visit: Payer: Medicare Other | Admitting: *Deleted

## 2019-08-02 DIAGNOSIS — I10 Essential (primary) hypertension: Secondary | ICD-10-CM

## 2019-08-02 DIAGNOSIS — E785 Hyperlipidemia, unspecified: Secondary | ICD-10-CM

## 2019-08-02 DIAGNOSIS — I739 Peripheral vascular disease, unspecified: Secondary | ICD-10-CM

## 2019-08-02 DIAGNOSIS — I5031 Acute diastolic (congestive) heart failure: Secondary | ICD-10-CM | POA: Diagnosis not present

## 2019-08-02 NOTE — Chronic Care Management (AMB) (Signed)
Chronic Care Management   Follow-up Note   08/02/2019 Name: KYANAH FELAN MRN: [id number] DOB: 09/15/1940  Referred by: [name], DO Reason for referral : Chronic Care Management (RN Initial Visit)   THIENAN WESTOVER is a 79 y.o. year old female who is a primary care patient of [name], DO. The CCM team was consulted for assistance with chronic disease management and care coordination needs.    Review of patient status, including review of consultants reports, relevant laboratory and other test results, and collaboration with appropriate care team members and the patient's provider was performed as part of comprehensive patient evaluation and provision of chronic care management services.    I spoke with Ms Wingrove by telephone today regarding management of her chronic medical conditions.   SDOH (Social Determinants of Health) assessments performed: No See Care Plan activities for detailed interventions related to [facility])     Outpatient Encounter Medications as of 08/02/2019  Medication Sig Note  . acetaminophen (TYLENOL) 500 MG tablet Take 2 tablets (1,000 mg total) by mouth every 8 (eight) hours. (Patient taking differently: Take 1,000 mg by mouth at bedtime as needed for headache (pain/sleep). )   . amiodarone (PACERONE) 200 MG tablet Take 1 tablet (200 mg total) by mouth 2 (two) times daily. For 2 days, then decrease to 200 mg daily   . apixaban (ELIQUIS) 5 MG TABS tablet Take 1 tablet (5 mg total) by mouth 2 (two) times daily.   [name] aspirin EC 81 MG EC tablet Take 1 tablet (81 mg total) by mouth daily.   [name] b complex vitamins tablet Take 1 tablet by mouth daily after breakfast.    . betamethasone valerate (VALISONE) 0.1 % cream Apply 1 application topically at bedtime. Apply externally to vaginal area   . cholecalciferol (VITAMIN D) 1000 UNITS tablet Take 1,000 Units by mouth daily after breakfast.    . clindamycin (CLEOCIN) 2 % vaginal cream Place 1 Applicatorful  vaginally at bedtime.   [name] Co-Enzyme Q10 200 MG CAPS Take 200 mg by mouth daily after breakfast.    . digoxin (LANOXIN) 0.125 MG tablet Take 1 tablet (0.125 mg total) by mouth daily.   . Evolocumab (REPATHA SURECLICK) XX123456 MG/ML SOAJ Inject 1 Dose into the skin every 14 (fourteen) days.   . Flaxseed, Linseed, (FLAX SEED OIL) 1000 MG CAPS Take 1,000 mg by mouth daily after breakfast.    . gabapentin (NEURONTIN) 100 MG capsule Take 1 capsule (100 mg total) by mouth at bedtime. 04/15/2019: Pt has been taking for a couple weeks - wondering if this caused the current shortness of breath   . Magnesium 300 MG CAPS Take 300 mg by mouth at bedtime.    . metoprolol tartrate (LOPRESSOR) 50 MG tablet TAKE (1) TABLET TWICE A DAY. (Patient taking differently: Take 50 mg by mouth 2 (two) times daily. TAKE (1) TABLET TWICE A DAY.)   . nitroGLYCERIN (NITROSTAT) 0.4 MG SL tablet PLACE 1 TABLET UNDER THE TONGUE AT ONSET OF CHEST PAIN EVERY 5 MINTUES UP TO 3 TIMES AS NEEDED (Patient taking differently: Place 0.4 mg under the tongue every 5 (five) minutes as needed for chest pain. )   . OVER THE COUNTER MEDICATION Take 1 tablet by mouth daily after breakfast. Elderberry/Vitamin C   . TURMERIC PO Take 1 capsule by mouth daily after breakfast.   . vitamin B-12 (CYANOCOBALAMIN) 500 MCG tablet Take 1 tablet (500 mcg total) by mouth daily.    Facility-Administered  Encounter Medications as of 08/02/2019  Medication  . DOBUTamine (DOBUTREX) 1,000 mcg/mL in dextrose 5% 250 mL infusion    RN Care Plan   . "I want the fluid in my legs to get better" (pt-stated)       CARE PLAN ENTRY (see longitudinal plan of care for additional care plan information)  Current Barriers:  . Care Coordination needs related to lower extremity edema in a patient with CHF, HTN, and peripheral vascular insuficiency (disease states)  Nurse Case Manager Clinical Goal(s):  [name] Over the next 30 days, patient will keep all scheduled medical  appointment . Over the next 60 days, patient will talk with RN Care Manager regarding lower extremity edema  Interventions:  . Inter-disciplinary care team collaboration (see longitudinal plan of care) . Previously consulted by LCSW regarding patient's support hose prescription from cardio . Chart reviewed including recent cardiology office notes . Discussed use of compression hose o Patient reports wearing them regularly but does report that they are difficult to get on . Recommended that she weight and record her weight daily o Patient reports that she is not weighing daily but that she feels like she has lost some fluid weight . Advised to call cardiologist with any weight increase of 3 lbs in one day or 5 lbs in one week . Reviewed and discussed medications o She is not taking a diuretic . Reviewed upcoming appointments: Dr Percival Spanish on 10/12/19 and Dr Lajuana Ripple on 08/12/19  Patient Self Care Activities:  . Performs ADL's independently . Performs IADL's independently  Initial goal documentation     . Chronic Disease Managment Needs       CARE PLAN ENTRY (see longtitudinal plan of care for additional care plan information)  Current Barriers:  . Chronic Disease Management support, education, and care coordination needs related to AAA, CAD, CHF, DDD, OA, HTN, HLD, peripheral vascular insufficiency   Clinical Goal(s) related to AAA, CAD, CHF, DDD, OA, HTN, HLD, peripheral vascular insufficiency:  Over the next 60 days, patient will:  . Work with the care management team to address educational, disease management, and care coordination needs  . Begin or continue self health monitoring activities as directed today Measure and record weight daily . Call provider office for new or worsened signs and symptoms Weight outside established parameters, Chest pain, Shortness of breath, and New or worsened symptom related to lower extremity edema . Call care management team with questions or  concerns . Verbalize basic understanding of patient centered plan of care established today  Interventions related to AAA, CAD, CHF, DDD, OA, HTN, HLD, peripheral vascular insufficiency:  . Evaluation of current treatment plans and patient's adherence to plan as established by provider . Assessed patient understanding of disease states . Assessed patient's education and care coordination needs . Provided disease specific education to patient  . Collaborated with appropriate clinical care team members regarding patient needs . Provided with RN Care Manager telephone number and encouraged to reach out as needed . Reviewed upcoming appointments: Dr Percival Spanish 10/12/19 and Dr Lajuana Ripple 08/12/19  Patient Self Care Activities related to AAA, CAD, CHF, DDD, OA, HTN, HLD, peripheral vascular insufficiency:  . Patient is able to perform ADLs and IADLs indepedently  Please see past updates related to this goal by clicking on the "Past Updates" button in the selected goal          Plan:   The care management team will reach out to the patient again over the next 30 days.  Chong Sicilian, BSN, RN-BC Embedded Chronic Care Manager [facility] [place] [facility] Direct Dial: [phone]

## 2019-08-02 NOTE — Patient Instructions (Signed)
Visit Information  Goals Addressed            This Visit's Progress     Patient Stated   . "I want the fluid in my legs to get better" (pt-stated)       CARE PLAN ENTRY (see longitudinal plan of care for additional care plan information)  Current Barriers:  . Care Coordination needs related to lower extremity edema in a patient with CHF, HTN, and peripheral vascular insuficiency (disease states)  Nurse Case Manager Clinical Goal(s):  [name] Over the next 30 days, patient will keep all scheduled medical appointment . Over the next 60 days, patient will talk with RN Care Manager regarding lower extremity edema  Interventions:  . Inter-disciplinary care team collaboration (see longitudinal plan of care) . Previously consulted by LCSW regarding patient's support hose prescription from cardio . Chart reviewed including recent cardiology office notes . Discussed use of compression hose o Patient reports wearing them regularly but does report that they are difficult to get on . Recommended that she weight and record her weight daily o Patient reports that she is not weighing daily but that she feels like she has lost some fluid weight . Advised to call cardiologist with any weight increase of 3 lbs in one day or 5 lbs in one week . Reviewed and discussed medications o She is not taking a diuretic . Reviewed upcoming appointments: Dr Percival Spanish on 10/12/19 and Dr Lajuana Ripple on 08/12/19  Patient Self Care Activities:  . Performs ADL's independently . Performs IADL's independently  Initial goal documentation       Other   . Chronic Disease Managment Needs       CARE PLAN ENTRY (see longtitudinal plan of care for additional care plan information)  Current Barriers:  . Chronic Disease Management support, education, and care coordination needs related to AAA, CAD, CHF, DDD, OA, HTN, HLD, peripheral vascular insufficiency   Clinical Goal(s) related to AAA, CAD, CHF, DDD, OA, HTN, HLD,  peripheral vascular insufficiency:  Over the next 60 days, patient will:  . Work with the care management team to address educational, disease management, and care coordination needs  . Begin or continue self health monitoring activities as directed today Measure and record weight daily . Call provider office for new or worsened signs and symptoms Weight outside established parameters, Chest pain, Shortness of breath, and New or worsened symptom related to lower extremity edema . Call care management team with questions or concerns . Verbalize basic understanding of patient centered plan of care established today  Interventions related to AAA, CAD, CHF, DDD, OA, HTN, HLD, peripheral vascular insufficiency:  . Evaluation of current treatment plans and patient's adherence to plan as established by provider . Assessed patient understanding of disease states . Assessed patient's education and care coordination needs . Provided disease specific education to patient  . Collaborated with appropriate clinical care team members regarding patient needs . Provided with RN Care Manager telephone number and encouraged to reach out as needed . Reviewed upcoming appointments: Dr Percival Spanish 10/12/19 and Dr Lajuana Ripple 08/12/19  Patient Self Care Activities related to AAA, CAD, CHF, DDD, OA, HTN, HLD, peripheral vascular insufficiency:  . Patient is able to perform ADLs and IADLs indepedently  Please see past updates related to this goal by clicking on the "Past Updates" button in the selected goal         The patient verbalized understanding of instructions provided today and declined a print copy of patient instruction  materials.   Follow-up Plan The care management team will reach out to the patient again over the next 30 days.   Chong Sicilian, BSN, RN-BC Embedded Chronic Care Manager [facility] [place] [facility] Direct Dial: [phone]

## 2019-08-03 MED ORDER — AMIODARONE HCL 200 MG PO TABS: 200.0000 mg | ORAL_TABLET | Freq: Two times a day (BID) | ORAL | 1 refills | Status: DC

## 2019-08-03 MED ORDER — CYANOCOBALAMIN 500 MCG PO TABS: 500.0000 ug | ORAL_TABLET | Freq: Every day | ORAL | 3 refills | Status: DC

## 2019-08-03 MED ORDER — DIGOXIN 125 MCG PO TABS: 0.1250 mg | ORAL_TABLET | Freq: Every day | ORAL | 3 refills | Status: DC

## 2019-08-12 ENCOUNTER — Other Ambulatory Visit: Payer: Self-pay

## 2019-08-12 ENCOUNTER — Encounter: Payer: Self-pay | Admitting: Family Medicine

## 2019-08-12 ENCOUNTER — Ambulatory Visit (INDEPENDENT_AMBULATORY_CARE_PROVIDER_SITE_OTHER): Payer: Medicare Other | Admitting: Family Medicine

## 2019-08-12 VITALS — BP 180/72 | HR 72 | Temp 97.4°F | Ht 63.0 in | Wt 177.0 lb

## 2019-08-12 DIAGNOSIS — I700 Atherosclerosis of aorta: Secondary | ICD-10-CM

## 2019-08-12 DIAGNOSIS — I739 Peripheral vascular disease, unspecified: Secondary | ICD-10-CM

## 2019-08-12 DIAGNOSIS — I480 Paroxysmal atrial fibrillation: Secondary | ICD-10-CM

## 2019-08-12 DIAGNOSIS — Z7901 Long term (current) use of anticoagulants: Secondary | ICD-10-CM

## 2019-08-12 DIAGNOSIS — I10 Essential (primary) hypertension: Secondary | ICD-10-CM

## 2019-08-12 DIAGNOSIS — Z951 Presence of aortocoronary bypass graft: Secondary | ICD-10-CM

## 2019-08-12 HISTORY — DX: Paroxysmal atrial fibrillation: I48.0

## 2019-08-12 NOTE — Progress Notes (Signed)
Subjective: CC: Establish care, history of CABG, CHF, atrial fibrillation PCP: [name], DO Gabriela Smith is a 79 y.o. female presenting to clinic today for:  1.  Atrial fibrillation with history of CHF and CABG x4 Patient reports compliance with amiodarone, metoprolol, digoxin, Eliquis.  She is very closely followed by Dr. Percival Spanish.  She does report lower extremity edema that is mild compared to when she was discharged from the hospital.  She notes that the compression hose she is have been using has been causing quite a bit of dermatitis in bilateral legs.  She does not feel that her legs are any less swollen than when she wears them.  She is wondering if she needs to continue utilizing these.  Denies any hematochezia, melena, chest pain, shortness of breath, heart palpitations.  She is on Repatha for cholesterol  She does note that she is not taking any of her medicines this morning because she wanted to be fasting for today's labs.   ROS: Per HPI  Allergies  Allergen Reactions  . Fish Allergy Hives, Shortness Of Breath and Other (See Comments)    "Bottom-feeder fish"  . Fish-Derived Products Hives, Shortness Of Breath and Other (See Comments)    Bottom-feeder fish  . Shellfish-Derived Products Hives and Shortness Of Breath  . Lasix [Furosemide] Swelling and Other (See Comments)    Edema, elevated BP  . Macrodantin [Nitrofurantoin Macrocrystal] Other (See Comments)    Unknown reaction   . Vicodin [Hydrocodone-Acetaminophen] Nausea And Vomiting and Other (See Comments)    SEVERE N/V  . Zetia [Ezetimibe] Other (See Comments)    Muscle aches  . Codeine Rash  . Latex Itching and Rash    Reports Elastic in underwear, under wire bras, and now surgical cap cause rash and itching.   . Niacin Other (See Comments)    Muscle aches  . Niaspan [Niacin Er] Other (See Comments)    Muscle aches  . Statins Other (See Comments)    Muscle aches/ cramps  . Sulfa Antibiotics  Rash and Other (See Comments)    Muscle aches/ cramps, also   Past Medical History:  Diagnosis Date  . AAA (abdominal aortic aneurysm)  [facility])    cardiology aware   . Arthritis    OA lt knee- cortizone inj. q 4 months AND OA ALSO IN BACK. Had knee replaced-issue resolved  . CAD (coronary artery disease) CARDIOLOGIST- [facility]--  VISIT 01-02-10 IN EPIC   1993-- PTCA. Pt describes a near total blockage of apparently the LAD and a 65% stenosis elsewhere.  . Cancer ([place])   . History of bladder cancer followed by dr Karsten Ro   hx  TCC of bladder ,  Ta G3-  first occurence 02-20-2012--  s/p BCG tx's  and  mitomycin C  . Hyperlipidemia 1993  . Hypertension 1993  . Nocturia   . Osteopenia   . Sigmoid diverticulosis     Current Outpatient Medications:  .  acetaminophen (TYLENOL) 500 MG tablet, Take 2 tablets (1,000 mg total) by mouth every 8 (eight) hours. (Patient taking differently: Take 1,000 mg by mouth at bedtime as needed for headache (pain/sleep). ), Disp: 30 tablet, Rfl: 0 .  amiodarone (PACERONE) 200 MG tablet, Take 1 tablet (200 mg total) by mouth 2 (two) times daily. For 2 days, then decrease to 200 mg daily, Disp: 60 tablet, Rfl: 1 .  apixaban (ELIQUIS) 5 MG TABS tablet, Take 1 tablet (5 mg total) by mouth 2 (two) times daily.,  Disp: 60 tablet, Rfl: 6 .  aspirin EC 81 MG EC tablet, Take 1 tablet (81 mg total) by mouth daily., Disp:  , Rfl:  .  b complex vitamins tablet, Take 1 tablet by mouth daily after breakfast. , Disp: , Rfl:  .  cholecalciferol (VITAMIN D) 1000 UNITS tablet, Take 1,000 Units by mouth daily after breakfast. , Disp: , Rfl:  .  Co-Enzyme Q10 200 MG CAPS, Take 200 mg by mouth daily after breakfast. , Disp: , Rfl:  .  digoxin (LANOXIN) 0.125 MG tablet, Take 1 tablet (0.125 mg total) by mouth daily., Disp: 30 tablet, Rfl: 3 .  Evolocumab (REPATHA SURECLICK) 782 MG/ML SOAJ, Inject 1 Dose into the skin every 14 (fourteen) days., Disp: 2 pen, Rfl: 11 .  Flaxseed,  Linseed, (FLAX SEED OIL) 1000 MG CAPS, Take 1,000 mg by mouth daily after breakfast. , Disp: , Rfl:  .  gabapentin (NEURONTIN) 100 MG capsule, Take 1 capsule (100 mg total) by mouth at bedtime., Disp: 90 capsule, Rfl: 3 .  Magnesium 300 MG CAPS, Take 300 mg by mouth at bedtime. , Disp: , Rfl:  .  metoprolol tartrate (LOPRESSOR) 50 MG tablet, TAKE (1) TABLET TWICE A DAY. (Patient taking differently: Take 50 mg by mouth 2 (two) times daily. TAKE (1) TABLET TWICE A DAY.), Disp: 180 tablet, Rfl: 3 .  nitroGLYCERIN (NITROSTAT) 0.4 MG SL tablet, PLACE 1 TABLET UNDER THE TONGUE AT ONSET OF CHEST PAIN EVERY 5 MINTUES UP TO 3 TIMES AS NEEDED (Patient taking differently: Place 0.4 mg under the tongue every 5 (five) minutes as needed for chest pain. ), Disp: 25 tablet, Rfl: 0 .  OVER THE COUNTER MEDICATION, Take 1 tablet by mouth daily after breakfast. Elderberry/Vitamin C, Disp: , Rfl:  .  TURMERIC PO, Take 1 capsule by mouth daily after breakfast., Disp: , Rfl:  .  vitamin B-12 (CYANOCOBALAMIN) 500 MCG tablet, Take 1 tablet (500 mcg total) by mouth daily., Disp: 30 tablet, Rfl: 3 No current facility-administered medications for this visit.  Facility-Administered Medications Ordered in Other Visits:  .  DOBUTamine (DOBUTREX) 1,000 mcg/mL in dextrose 5% 250 mL infusion, 20 mcg/kg/min, Intravenous, Continuous, Dorothy Spark, MD, Last Rate: 96.4 mL/hr at 12/24/15 0840, 20 mcg/kg/min at 12/24/15 0840 Social History   Socioeconomic History  . Marital status: Married    Spouse name: Not on file  . Number of children: 3  . Years of education: 46  . Highest education level: High school graduate  Occupational History  . Occupation: retired  Tobacco Use  . Smoking status: Former Smoker    Packs/day: 0.50    Years: 5.00    Pack years: 2.50    Types: Cigarettes    Quit date: 03/09/1976    Years since quitting: 43.4  . Smokeless tobacco: Never Used  Substance and Sexual Activity  . Alcohol use: Yes     Alcohol/week: 5.0 standard drinks    Types: 5 Glasses of wine per week    Comment: red wine  . Drug use: No  . Sexual activity: Not Currently  Other Topics Concern  . Not on file  Social History Narrative   Married with 3 children and 4 grandchildren.  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 with Parkinsons   Social Determinants of Health   Financial Resource Strain: Low Risk   . Difficulty of Paying Living Expenses: Not hard at all  Food Insecurity: No Food Insecurity  . Worried About Charity fundraiser in the Last  Year: Never true  . Ran Out of Food in the Last Year: Never true  Transportation Needs: No Transportation Needs  . Lack of Transportation (Medical): No  . Lack of Transportation (Non-Medical): No  Physical Activity: Sufficiently Active  . Days of Exercise per Week: 7 days  . Minutes of Exercise per Session: 50 min  Stress: No Stress Concern Present  . Feeling of Stress : Not at all  Social Connections: Slightly Isolated  . Frequency of Communication with Friends and Family: More than three times a week  . Frequency of Social Gatherings with Friends and Family: More than three times a week  . Attends Religious Services: More than 4 times per year  . Active Member of Clubs or Organizations: No  . Attends Archivist Meetings: Never  . Marital Status: Married  Human resources officer Violence: Not At Risk  . Fear of Current or Ex-Partner: No  . Emotionally Abused: No  . Physically Abused: No  . Sexually Abused: No   Family History  Problem Relation Age of Onset  . Coronary artery disease Father 88  . Hyperlipidemia Father   . Heart attack Mother 70  . Hyperlipidemia Mother   . Osteoporosis Sister   . Hyperlipidemia Sister   . Heart attack Daughter 57       CABG x 5  . Heart disease Son 45       Stents x 3    Objective: Office vital signs reviewed. BP (!) 188/85   Pulse 72   Temp (!) 97.4 F (36.3 C) (Temporal)   Ht '5\' 3"'  (1.6 m)   Wt 177 lb (80.3 kg)   SpO2 93%    BMI 31.35 kg/m   Physical Examination:  General: Awake, alert, well nourished, appears younger than stated age, No acute distress HEENT: Normal, sclera white Cardio: regular rate and rhythm, Y6V7 heard, systolic murmur appreciated Pulm: clear to auscultation bilaterally, no wheezes, rhonchi or rales; normal work of breathing on room air Extremities: warm, well perfused, trace right lower extremity edema to mid shin, 1+ lower extremity edema to mid shin on left edema, no cyanosis or clubbing; +2 pulses bilaterally MSK: Normal gait and station Skin: Healing dermatitis noted on bilateral lower extremities.  She has a small abrasion noted along the left anterior shin  Assessment/ Plan: 79 y.o. female   1. Essential hypertension, benign Well-controlled.  Continue current regimen - CMP14+EGFR  2. Peripheral vascular insufficiency (HCC) We discussed that if the compression hose are causing more harm than good okay to discontinue and only use as needed.  Encouraged ongoing low-salt diet, leg elevation and physical activity as tolerated - CMP14+EGFR  3. Paroxysmal A-fib (HCC) Seemly rate and rhythm controlled today.  Will obtain TSH and dig level and sent to cardiologist - TSH - Digoxin level  4. S/P CABG x 4  CMP14+EGFR - TSH  5. Aortic atherosclerosis (HCC) - CMP14+EGFR - TSH - Lipid Panel  6. Chronic anticoagulation No red flags - CMP14+EGFR - CBC   No orders of the defined types were placed in this encounter.  No orders of the defined types were placed in this encounter.    [name], DO [place] [phone]

## 2019-08-12 NOTE — Patient Instructions (Signed)

## 2019-08-13 LAB — CMP14+EGFR
ALT: 25 IU/L (ref 0–32)
AST: 22 IU/L (ref 0–40)
Albumin/Globulin Ratio: 1.7 (ref 1.2–2.2)
Albumin: 4.5 g/dL (ref 3.7–4.7)
Alkaline Phosphatase: 72 IU/L (ref 48–121)
BUN/Creatinine Ratio: 16 (ref 12–28)
BUN: 13 mg/dL (ref 8–27)
Bilirubin Total: 0.3 mg/dL (ref 0.0–1.2)
CO2: 22 mmol/L (ref 20–29)
Calcium: 9.4 mg/dL (ref 8.7–10.3)
Chloride: 103 mmol/L (ref 96–106)
Creatinine, Ser: 0.81 mg/dL (ref 0.57–1.00)
GFR calc Af Amer: 79 mL/min/{1.73_m2} (ref >59)
GFR calc non Af Amer: 69 mL/min/{1.73_m2} (ref >59)
Globulin, Total: 2.7 g/dL (ref 1.5–4.5)
Glucose: 107 mg/dL — ABNORMAL HIGH (ref 65–99)
Potassium: 4.7 mmol/L (ref 3.5–5.2)
Sodium: 139 mmol/L (ref 134–144)
Total Protein: 7.2 g/dL (ref 6.0–8.5)

## 2019-08-13 LAB — CBC
Hematocrit: 36.9 % (ref 34.0–46.6)
Hemoglobin: 11.8 g/dL (ref 11.1–15.9)
MCH: 30.2 pg (ref 26.6–33.0)
MCHC: 32 g/dL (ref 31.5–35.7)
MCV: 94 fL (ref 79–97)
Platelets: 248 10*3/uL (ref 150–450)
RBC: 3.91 x10E6/uL (ref 3.77–5.28)
RDW: 13.8 % (ref 11.7–15.4)
WBC: 6.6 10*3/uL (ref 3.4–10.8)

## 2019-08-13 LAB — TSH: TSH: 8.59 u[IU]/mL — ABNORMAL HIGH (ref 0.450–4.500)

## 2019-08-13 LAB — LIPID PANEL
Chol/HDL Ratio: 3.1 ratio (ref 0.0–4.4)
Cholesterol, Total: 144 mg/dL (ref 100–199)
HDL: 46 mg/dL (ref >39)
LDL Chol Calc (NIH): 62 mg/dL (ref 0–99)
Triglycerides: 224 mg/dL — ABNORMAL HIGH (ref 0–149)
VLDL Cholesterol Cal: 36 mg/dL (ref 5–40)

## 2019-08-13 LAB — DIGOXIN LEVEL: Digoxin, Serum: 0.8 ng/mL (ref 0.5–0.9)

## 2019-08-15 ENCOUNTER — Other Ambulatory Visit: Payer: Self-pay

## 2019-08-15 ENCOUNTER — Telehealth: Payer: Self-pay | Admitting: Family Medicine

## 2019-08-15 ENCOUNTER — Encounter (HOSPITAL_COMMUNITY)
Admission: RE | Admit: 2019-08-15 | Discharge: 2019-08-15 | Disposition: A | Discharge status: Home / Self Care | Payer: Medicare Other | Source: Ambulatory Visit | Attending: Cardiology | Admitting: Cardiology

## 2019-08-15 ENCOUNTER — Telehealth: Payer: Self-pay | Admitting: Cardiology

## 2019-08-15 DIAGNOSIS — I10 Essential (primary) hypertension: Secondary | ICD-10-CM | POA: Diagnosis not present

## 2019-08-15 DIAGNOSIS — Z951 Presence of aortocoronary bypass graft: Secondary | ICD-10-CM | POA: Insufficient documentation

## 2019-08-15 DIAGNOSIS — I2510 Atherosclerotic heart disease of native coronary artery without angina pectoris: Secondary | ICD-10-CM | POA: Diagnosis not present

## 2019-08-15 NOTE — Telephone Encounter (Signed)
Has already been addressed by Dr. Percival Spanish earlier today. Will close out the message.

## 2019-08-15 NOTE — Progress Notes (Signed)
Patient came in for her orientation visit today to start cardiac rehab. Her blood pressure was 204/84 seated and 210/82 standing. I notified her PCP and spoke with Caryl Pina at Dr. Marjean Donna office. She said they would notify the MD on call. Dr. Lajuana Ripple was off today. Patient stated she was SOB and having some vision changes but these were not new symptoms. She has been having these symptoms since her CABG 04/25/2019. O2 saturation was 96%. Patient's orientation will be rescheduled after her blood pressure is controlled. She is in agreement with this plan.

## 2019-08-15 NOTE — Telephone Encounter (Signed)
New Message  have Stevan Born, [facility] in [place], [place] on the line looking to speak with nurse about the pt. her blood pressure being high and don't want her to go to Cardiac rehab

## 2019-08-15 NOTE — Telephone Encounter (Signed)
Will need to see her PCP about hr blood pressure

## 2019-08-15 NOTE — Telephone Encounter (Signed)
Spoke with Debbie with [facility].  She states that pt is new to them. They are unable to perform walk test.  Their protocol is not to initiate testing if BP is over 180/110.  Pt denies any symptoms and taking medications as instructed.

## 2019-08-15 NOTE — Telephone Encounter (Signed)
Debbie with [facility] calling to state they are unable to begin this pts rehab today due to her bp. Sitting BP= 204/82 Standing= 210/80. They are not able to start therapy until her bp is under control. Requesting a call back from the nurse. Pt is still at their facility.

## 2019-08-15 NOTE — Telephone Encounter (Signed)
Was called by the urgent care.  The patient presented because she was at cardiac rehab and her blood pressure was too high.  Is been documented as high recently.  I asked that she start Norvasc 2.5 mg daily.  I do not see any contraindications or allergies.  I will start this.

## 2019-08-15 NOTE — Telephone Encounter (Signed)
Dr. Lajuana Ripple does not have any available appts. Patient states that she is having some visual changes and dyspnea.  Advised patient that she needs to be evaluated.  Patient verbalizes she will be seen in ER or Urgent Care

## 2019-08-16 MED ORDER — AMLODIPINE BESYLATE 2.5 MG PO TABS: 2.5000 mg | ORAL_TABLET | Freq: Every day | ORAL | 3 refills | Status: DC

## 2019-08-16 NOTE — Addendum Note (Signed)
Addended by: Sherrie Mustache on: 08/16/2019 01:07 PM   Modules accepted: Orders

## 2019-08-17 ENCOUNTER — Encounter (HOSPITAL_COMMUNITY): Payer: Medicare Other

## 2019-08-17 ENCOUNTER — Encounter (HOSPITAL_COMMUNITY): Payer: Self-pay

## 2019-08-18 ENCOUNTER — Ambulatory Visit: Payer: Medicare Other | Admitting: Family Medicine

## 2019-08-19 ENCOUNTER — Encounter (HOSPITAL_COMMUNITY): Payer: Medicare Other

## 2019-08-22 ENCOUNTER — Encounter (HOSPITAL_COMMUNITY): Payer: Medicare Other

## 2019-08-24 ENCOUNTER — Encounter (HOSPITAL_COMMUNITY): Payer: Medicare Other

## 2019-08-26 ENCOUNTER — Encounter (HOSPITAL_COMMUNITY): Payer: Medicare Other

## 2019-08-29 ENCOUNTER — Other Ambulatory Visit: Payer: Self-pay

## 2019-08-29 ENCOUNTER — Encounter (HOSPITAL_COMMUNITY)
Admission: RE | Admit: 2019-08-29 | Discharge: 2019-08-29 | Disposition: A | Discharge status: Home / Self Care | Payer: Medicare Other | Source: Ambulatory Visit | Attending: Cardiology | Admitting: Cardiology

## 2019-08-29 ENCOUNTER — Encounter (HOSPITAL_COMMUNITY): Payer: Medicare Other

## 2019-08-29 ENCOUNTER — Encounter (HOSPITAL_COMMUNITY): Payer: Self-pay

## 2019-08-29 VITALS — BP 170/64 | HR 62 | Ht 63.0 in | Wt 176.4 lb

## 2019-08-29 DIAGNOSIS — Z951 Presence of aortocoronary bypass graft: Secondary | ICD-10-CM

## 2019-08-29 NOTE — Progress Notes (Signed)
Cardiac Individual Treatment Plan  Patient Details  Name: Gabriela Smith MRN: [id number] Date of Birth: 03-27-1940 Referring Provider:     [place] from 08/29/2019 in [place]  Referring Provider Dr. Percival Spanish      Initial Encounter Date:    CARDIAC REHAB PHASE II ORIENTATION from 08/29/2019 in [place]  Date 08/29/19      Visit Diagnosis: S/P CABG x 4  Patient's Home Medications on Admission:  Current Outpatient Medications:  .  acetaminophen (TYLENOL) 500 MG tablet, Take 2 tablets (1,000 mg total) by mouth every 8 (eight) hours. (Patient taking differently: Take 1,000 mg by mouth at bedtime as needed for headache (pain/sleep). ), Disp: 30 tablet, Rfl: 0 .  amiodarone (PACERONE) 200 MG tablet, Take 1 tablet (200 mg total) by mouth 2 (two) times daily. For 2 days, then decrease to 200 mg daily, Disp: 60 tablet, Rfl: 1 .  amLODipine (NORVASC) 2.5 MG tablet, Take 1 tablet (2.5 mg total) by mouth daily., Disp: 90 tablet, Rfl: 3 .  apixaban (ELIQUIS) 5 MG TABS tablet, Take 1 tablet (5 mg total) by mouth 2 (two) times daily., Disp: 60 tablet, Rfl: 6 .  aspirin EC 81 MG EC tablet, Take 1 tablet (81 mg total) by mouth daily., Disp:  , Rfl:  .  b complex vitamins tablet, Take 1 tablet by mouth daily after breakfast. , Disp: , Rfl:  .  cholecalciferol (VITAMIN D) 1000 UNITS tablet, Take 1,000 Units by mouth daily after breakfast. , Disp: , Rfl:  .  Co-Enzyme Q10 200 MG CAPS, Take 200 mg by mouth daily after breakfast. , Disp: , Rfl:  .  digoxin (LANOXIN) 0.125 MG tablet, Take 1 tablet (0.125 mg total) by mouth daily., Disp: 30 tablet, Rfl: 3 .  Evolocumab (REPATHA SURECLICK) 016 MG/ML SOAJ, Inject 1 Dose into the skin every 14 (fourteen) days., Disp: 2 pen, Rfl: 11 .  Flaxseed, Linseed, (FLAX SEED OIL) 1000 MG CAPS, Take 1,000 mg by mouth daily after breakfast. , Disp: , Rfl:  .  gabapentin (NEURONTIN) 100 MG capsule,  Take 1 capsule (100 mg total) by mouth at bedtime., Disp: 90 capsule, Rfl: 3 .  Magnesium 300 MG CAPS, Take 300 mg by mouth at bedtime. , Disp: , Rfl:  .  metoprolol tartrate (LOPRESSOR) 50 MG tablet, TAKE (1) TABLET TWICE A DAY. (Patient taking differently: Take 50 mg by mouth 2 (two) times daily. TAKE (1) TABLET TWICE A DAY.), Disp: 180 tablet, Rfl: 3 .  nitroGLYCERIN (NITROSTAT) 0.4 MG SL tablet, PLACE 1 TABLET UNDER THE TONGUE AT ONSET OF CHEST PAIN EVERY 5 MINTUES UP TO 3 TIMES AS NEEDED (Patient taking differently: Place 0.4 mg under the tongue every 5 (five) minutes as needed for chest pain. ), Disp: 25 tablet, Rfl: 0 .  OVER THE COUNTER MEDICATION, Take 1 tablet by mouth daily after breakfast. Elderberry/Vitamin C, Disp: , Rfl:  .  TURMERIC PO, Take 1 capsule by mouth daily after breakfast., Disp: , Rfl:  .  vitamin B-12 (CYANOCOBALAMIN) 500 MCG tablet, Take 1 tablet (500 mcg total) by mouth daily., Disp: 30 tablet, Rfl: 3 No current facility-administered medications for this encounter.  Facility-Administered Medications Ordered in Other Encounters:  .  DOBUTamine (DOBUTREX) 1,000 mcg/mL in dextrose 5% 250 mL infusion, 20 mcg/kg/min, Intravenous, Continuous, Dorothy Spark, MD, Last Rate: 96.4 mL/hr at 12/24/15 0840, 20 mcg/kg/min at 12/24/15 0840  Past Medical History: Past Medical History:  Diagnosis Date  . AAA (abdominal aortic aneurysm) [facility])    cardiology aware   . Arthritis    OA lt knee- cortizone inj. q 4 months AND OA ALSO IN BACK. Had knee replaced-issue resolved  . CAD (coronary artery disease) CARDIOLOGIST- DR [facility]--  VISIT 01-02-10 IN [facility]   1993-- PTCA. Pt describes a near total blockage of apparently the LAD and a 65% stenosis elsewhere.  . Cancer ([place])   . History of bladder cancer followed by dr Karsten Ro   hx  TCC of bladder ,  Ta G3-  first occurence 02-20-2012--  s/p BCG tx's  and  mitomycin C  . Hyperlipidemia 1993  . Hypertension 1993  . Nocturia   .  Osteopenia   . Sigmoid diverticulosis     Tobacco Use: Social History   Tobacco Use  Smoking Status Former Smoker  . Packs/day: 0.50  . Years: 5.00  . Pack years: 2.50  . Types: Cigarettes  . Quit date: 03/09/1976  . Years since quitting: 43.5  Smokeless Tobacco Never Used    Labs: Recent Chemical engineer    Labs for ITP Cardiac and Pulmonary Rehab Latest Ref Rng & Units 04/25/2019 04/25/2019 04/25/2019 04/28/2019 08/12/2019   Cholestrol 100 - 199 mg/dL - - - - 144   LDLCALC 0 - 99 mg/dL - - - - 62   LDLDIRECT 0 - 99 mg/dL - - - - -   HDL >39 mg/dL - - - - 46   Trlycerides 0 - 149 mg/dL - - - - 224(H)   Hemoglobin A1c 4.8 - 5.6 % - - - - -   PHART 7.35 - 7.45 7.303(L) 7.344(L) 7.349(L) - -   PCO2ART 32 - 48 mmHg 41.8 35.2 40.8 - -   HCO3 20.0 - 28.0 mmol/L 20.9 19.3(L) 22.6 24.0 -   TCO2 22 - 32 mmol/L 22 20(L) 24 25 -   ACIDBASEDEF 0.0 - 2.0 mmol/L 5.0(H) 6.0(H) 3.0(H) 1.0 -   O2SAT % 96.0 99.0 98.0 59.0 -      Capillary Blood Glucose: Lab Results  Component Value Date   GLUCAP 108 (H) 05/02/2019   GLUCAP 96 05/01/2019   GLUCAP 122 (H) 05/01/2019   GLUCAP 102 (H) 05/01/2019   GLUCAP 93 05/01/2019     Exercise Target Goals: Exercise Program Goal: Individual exercise prescription set using results from initial 6 min walk test and THRR while considering  patient's activity barriers and safety.   Exercise Prescription Goal: Starting with aerobic activity 30 plus minutes a day, 3 days per week for initial exercise prescription. Provide home exercise prescription and guidelines that participant acknowledges understanding prior to discharge.  Activity Barriers & Risk Stratification:  Activity Barriers & Cardiac Risk Stratification - 08/29/19 1207      Activity Barriers & Cardiac Risk Stratification   Activity Barriers Deconditioning    Cardiac Risk Stratification High           6 Minute Walk:  6 Minute Walk    Row Name 08/29/19 1158         6 Minute  Walk   Phase Initial     Distance 1200 feet     Walk Time 6 minutes     # of Rest Breaks 0     MPH 2.27     METS 2.24     RPE 13     VO2 Peak 7.83     Symptoms No     Resting HR 62 bpm  Resting BP 170/64     Resting Oxygen Saturation  95 %     Exercise Oxygen Saturation  during 6 min walk 2.39 %     Max Ex. HR 96 bpm     Max Ex. BP 182/76     2 Minute Post BP 158/64            Oxygen Initial Assessment:   Oxygen Re-Evaluation:   Oxygen Discharge (Final Oxygen Re-Evaluation):   Initial Exercise Prescription:  Initial Exercise Prescription - 08/29/19 1200      Date of Initial Exercise RX and Referring Provider   Date 08/29/19    Referring Provider Dr. Percival Spanish    Expected Discharge Date 11/29/19      Treadmill   MPH 1.5    Grade 0    Minutes 17    METs 2.14      T5 Nustep   Level 1    SPM 80    Minutes 22      Prescription Details   Frequency (times per week) 3    Duration Progress to 30 minutes of continuous aerobic without signs/symptoms of physical distress      Intensity   THRR 40-80% of Max Heartrate 56-112    Ratings of Perceived Exertion 11-13      Progression   Progression Continue progressive overload as per policy without signs/symptoms or physical distress.      Resistance Training   Training Prescription Yes    Weight 1    Reps 10-15           Perform Capillary Blood Glucose checks as needed.  Exercise Prescription Changes:   Exercise Comments:   Exercise Goals and Review:   Exercise Goals    Row Name 08/29/19 1205             Exercise Goals   Increase Physical Activity Yes       Intervention Develop an individualized exercise prescription for aerobic and resistive training based on initial evaluation findings, risk stratification, comorbidities and participant's personal goals.;Provide advice, education, support and counseling about physical activity/exercise needs.       Expected Outcomes Short Term: Attend rehab  on a regular basis to increase amount of physical activity.;Long Term: Add in home exercise to make exercise part of routine and to increase amount of physical activity.;Long Term: Exercising regularly at least 3-5 days a week.       Increase Strength and Stamina Yes       Intervention Provide advice, education, support and counseling about physical activity/exercise needs.;Develop an individualized exercise prescription for aerobic and resistive training based on initial evaluation findings, risk stratification, comorbidities and participant's personal goals.       Expected Outcomes Short Term: Increase workloads from initial exercise prescription for resistance, speed, and METs.;Short Term: Perform resistance training exercises routinely during rehab and add in resistance training at home;Long Term: Improve cardiorespiratory fitness, muscular endurance and strength as measured by increased METs and functional capacity (6MWT)       Able to understand and use rate of perceived exertion (RPE) scale Yes       Intervention Provide education and explanation on how to use RPE scale       Expected Outcomes Short Term: Able to use RPE daily in rehab to express subjective intensity level;Long Term:  Able to use RPE to guide intensity level when exercising independently       Knowledge and understanding of Target Heart Rate Range (THRR) Yes  Intervention Provide education and explanation of THRR including how the numbers were predicted and where they are located for reference       Expected Outcomes Short Term: Able to state/look up THRR;Long Term: Able to use THRR to govern intensity when exercising independently;Short Term: Able to use daily as guideline for intensity in rehab       Able to check pulse independently Yes       Intervention Provide education and demonstration on how to check pulse in carotid and radial arteries.;Review the importance of being able to check your own pulse for safety during  independent exercise       Expected Outcomes Short Term: Able to explain why pulse checking is important during independent exercise;Long Term: Able to check pulse independently and accurately       Understanding of Exercise Prescription Yes       Intervention Provide education, explanation, and written materials on patient's individual exercise prescription       Expected Outcomes Short Term: Able to explain program exercise prescription;Long Term: Able to explain home exercise prescription to exercise independently              Exercise Goals Re-Evaluation :    Discharge Exercise Prescription (Final Exercise Prescription Changes):   Nutrition:  Target Goals: Understanding of nutrition guidelines, daily intake of sodium 1500mg , cholesterol 200mg , calories 30% from fat and 7% or less from saturated fats, daily to have 5 or more servings of fruits and vegetables.  Biometrics:  Pre Biometrics - 08/29/19 1206      Pre Biometrics   Height 5\' 3"  (1.6 m)    Weight 80 kg    Waist Circumference 41 inches    Hip Circumference 47 inches    Waist to Hip Ratio 0.87 %    BMI (Calculated) 31.25    Triceps Skinfold 26 mm    % Body Fat 43.7 %    Grip Strength 26 kg    Flexibility 0 in    Single Leg Stand 3.16 seconds            Nutrition Therapy Plan and Nutrition Goals:  Nutrition Therapy & Goals - 08/29/19 1249      Personal Nutrition Goals   Comments Patient says she follows a low sodium diet. She scored 21 on her medficts diet assessment score. She is not sure if she can attend the RD class due to her husband. Will offer to set up a one-on-one meeting with RD if she is interested. Will continue to monitor.      Intervention Plan   Intervention Nutrition handout(s) given to patient.           Nutrition Assessments:  Nutrition Assessments - 08/29/19 1251      MEDFICTS Scores   Pre Score 21           Nutrition Goals Re-Evaluation:   Nutrition Goals Discharge  (Final Nutrition Goals Re-Evaluation):   Psychosocial: Target Goals: Acknowledge presence or absence of significant depression and/or stress, maximize coping skills, provide positive support system. Participant is able to verbalize types and ability to use techniques and skills needed for reducing stress and depression.  Initial Review & Psychosocial Screening:  Initial Psych Review & Screening - 08/29/19 1246      Initial Review   Current issues with None Identified      Family Dynamics   Good Support System? Yes      Barriers   Psychosocial barriers to participate in program There  are no identifiable barriers or psychosocial needs.      Screening Interventions   Interventions Encouraged to exercise    Expected Outcomes Short Term goal: Identification and review with participant of any Quality of Life or Depression concerns found by scoring the questionnaire.;Long Term goal: The participant improves quality of Life and PHQ9 Scores as seen by post scores and/or verbalization of changes           Quality of Life Scores:  Quality of Life - 08/29/19 1119      Quality of Life   Select Quality of Life      Quality of Life Scores   Health/Function Pre 27.21 %    Socioeconomic Post 29 %    Psych/Spiritual Pre 29.14 %    Family Pre 28.8 %    GLOBAL Pre 28.48 %          Scores of 19 and below usually indicate a poorer quality of life in these areas.  A difference of  2-3 points is a clinically meaningful difference.  A difference of 2-3 points in the total score of the Quality of Life Index has been associated with significant improvement in overall quality of life, self-image, physical symptoms, and general health in studies assessing change in quality of life.  PHQ-9: Recent Review Flowsheet Data    Depression screen [facility] 2/9 08/29/2019 08/12/2019 07/06/2019 02/17/2019 12/02/2018   Decreased Interest 0 0 0 0 0   Down, Depressed, Hopeless 0 0 0 0 0   PHQ - 2 Score 0 0 0 0 0    Altered sleeping 1 0 1 - -   Tired, decreased energy 0 0 0 - -   Change in appetite 0 0 1 - -   Feeling bad or failure about yourself  0 0 0 - -   Trouble concentrating 0 0 1 - -   Moving slowly or fidgety/restless 0 0 1 - -   Suicidal thoughts 0 0 0 - -   PHQ-9 Score 1 0 4 - -   Difficult doing work/chores Not difficult at all - Somewhat difficult - -     Interpretation of Total Score  Total Score Depression Severity:  1-4 = Minimal depression, 5-9 = Mild depression, 10-14 = Moderate depression, 15-19 = Moderately severe depression, 20-27 = Severe depression   Psychosocial Evaluation and Intervention:  Psychosocial Evaluation - 08/29/19 1247      Psychosocial Evaluation & Interventions   Interventions Encouraged to exercise with the program and follow exercise prescription;Stress management education;Relaxation education    Comments Patient's initial QOL score was 28.48 an her PHQ-9 score was 1 with no psychosocial issues identified. 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 She rates her stress level as low. She says she is able to care for him without difficulty and plans to bring him with her to the sessions. Will continue to monitor.    Expected Outcomes Patient will have no psychosocial issues identified at discharge.    Continue Psychosocial Services  No Follow up required           Psychosocial Re-Evaluation:   Psychosocial Discharge (Final Psychosocial Re-Evaluation):   Vocational Rehabilitation: Provide vocational rehab assistance to qualifying candidates.   Vocational Rehab Evaluation & Intervention:  Vocational Rehab - 08/29/19 1252      Initial Vocational Rehab Evaluation & Intervention   Assessment shows need for Vocational Rehabilitation No      Vocational Rehab Re-Evaulation  Comments Patient is retired an is not interested in returning to [employer].           Education: Education Goals: Education classes  will be provided on a weekly basis, covering required topics. Participant will state understanding/return demonstration of topics presented.  Learning Barriers/Preferences:  Learning Barriers/Preferences - 08/29/19 1251      Learning Barriers/Preferences   Learning Barriers None    Learning Preferences Written Material;Skilled Demonstration           Education Topics: Hypertension, Hypertension Reduction -Define heart disease and high blood pressure. Discus how high blood pressure affects the body and ways to reduce high blood pressure.   Exercise and Your Heart -Discuss why it is important to exercise, the FITT principles of exercise, normal and abnormal responses to exercise, and how to exercise safely.   Angina -Discuss definition of angina, causes of angina, treatment of angina, and how to decrease risk of having angina.   Cardiac Medications -Review what the following cardiac medications are used for, how they affect the body, and side effects that may occur when taking the medications.  Medications include Aspirin, Beta blockers, calcium channel blockers, ACE Inhibitors, angiotensin receptor blockers, diuretics, digoxin, and antihyperlipidemics.   Congestive Heart Failure -Discuss the definition of CHF, how to live with CHF, the signs and symptoms of CHF, and how keep track of weight and sodium intake.   Heart Disease and Intimacy -Discus the effect sexual activity has on the heart, how changes occur during intimacy as we age, and safety during sexual activity.   Smoking Cessation / COPD -Discuss different methods to quit smoking, the health benefits of quitting smoking, and the definition of COPD.   Nutrition I: Fats -Discuss the types of cholesterol, what cholesterol does to the heart, and how cholesterol levels can be controlled.   Nutrition II: Labels -Discuss the different components of food labels and how to read food label   Heart Parts/Heart Disease and  PAD -Discuss the anatomy of the heart, the pathway of blood circulation through the heart, and these are affected by heart disease.   Stress I: Signs and Symptoms -Discuss the causes of stress, how stress may lead to anxiety and depression, and ways to limit stress.   Stress II: Relaxation -Discuss different types of relaxation techniques to limit stress.   Warning Signs of Stroke / TIA -Discuss definition of a stroke, what the signs and symptoms are of a stroke, and how to identify when someone is having stroke.   Knowledge Questionnaire Score:  Knowledge Questionnaire Score - 08/29/19 1251      Knowledge Questionnaire Score   Pre Score 22/24           Core Components/Risk Factors/Patient Goals at Admission:  Personal Goals and Risk Factors at Admission - 08/29/19 1252      Core Components/Risk Factors/Patient Goals on Admission    Weight Management Weight Maintenance    Hypertension Yes    Intervention Provide education on lifestyle modifcations including regular physical activity/exercise, weight management, moderate sodium restriction and increased consumption of fresh fruit, vegetables, and low fat dairy, alcohol moderation, and smoking cessation.;Monitor prescription use compliance.    Expected Outcomes Short Term: Continued assessment and intervention until BP is < 140/70mm HG in hypertensive participants. < 130/49mm HG in hypertensive participants with diabetes, heart failure or chronic kidney disease.;Long Term: Maintenance of blood pressure at goal levels.    Personal Goal Other Yes    Personal Goal Stay healthy; be able to  enjoy her grandchildren. Live a healthy life.    Intervention Patient will attend CR 3 days/week and supplement with exercise 2 days/week.    Expected Outcomes Patient will complete the program meeting both program and personal goals.           Core Components/Risk Factors/Patient Goals Review:    Core Components/Risk Factors/Patient Goals  at Discharge (Final Review):    ITP Comments:   Comments: Patient arrived for 1st visit/orientation/education at 11:00. Patient was referred to CR by Thayer Headings, MD due to CABGx4. During orientation advised patient on arrival and appointment times what to wear, what to do before, during and after exercise. Reviewed attendance and class policy. Talked about inclement weather and class consultation policy. Pt is scheduled to return Cardiac Rehab on 08/31/19 at 11:00. Pt was advised to come to class 15 minutes before class starts. Patient was also given instructions on meeting with the dietician and attending the Family Structure classes. Discussed RPE/Dpysnea scales. Discussed initial THR and how to find their radial and/or carotid pulse. Discussed the initial exercise prescription and how this effects their progress. Pt is eager to get started. Patient participated in warm up stretches followed by light weights and resistance bands. Patient was able to complete 6 minute walk test.  Patient was measured for the equipment. Discussed equipment safety with patient. Took patient pre-anthropometric measurements. Patient finished visit at 11:45.

## 2019-08-31 ENCOUNTER — Ambulatory Visit (INDEPENDENT_AMBULATORY_CARE_PROVIDER_SITE_OTHER): Payer: Medicare Other | Admitting: Licensed Clinical Social Worker

## 2019-08-31 ENCOUNTER — Encounter (HOSPITAL_COMMUNITY)
Admission: RE | Admit: 2019-08-31 | Discharge: 2019-08-31 | Disposition: A | Discharge status: Home / Self Care | Payer: Medicare Other | Source: Ambulatory Visit | Attending: Cardiology | Admitting: Cardiology

## 2019-08-31 ENCOUNTER — Other Ambulatory Visit: Payer: Self-pay

## 2019-08-31 DIAGNOSIS — M5136 Other intervertebral disc degeneration, lumbar region: Secondary | ICD-10-CM

## 2019-08-31 DIAGNOSIS — I10 Essential (primary) hypertension: Secondary | ICD-10-CM | POA: Diagnosis not present

## 2019-08-31 DIAGNOSIS — I714 Abdominal aortic aneurysm, without rupture, unspecified: Secondary | ICD-10-CM

## 2019-08-31 DIAGNOSIS — E559 Vitamin D deficiency, unspecified: Secondary | ICD-10-CM

## 2019-08-31 DIAGNOSIS — Z951 Presence of aortocoronary bypass graft: Secondary | ICD-10-CM

## 2019-08-31 DIAGNOSIS — I5031 Acute diastolic (congestive) heart failure: Secondary | ICD-10-CM | POA: Diagnosis not present

## 2019-08-31 DIAGNOSIS — E785 Hyperlipidemia, unspecified: Secondary | ICD-10-CM

## 2019-08-31 NOTE — Progress Notes (Signed)
Daily Session Note  Patient Details  Name: SAYURI RHAMES MRN: [id number] Date of Birth: [birth year] Referring Provider:     CARDIAC REHAB PHASE II ORIENTATION from 08/29/2019 in [place]  Referring Provider Dr. Percival Spanish      Encounter Date: 08/31/2019  Check In:  Session Check In - 08/31/19 1057      Check-In   Supervising physician immediately available to respond to emergencies See telemetry face sheet for immediately available ER MD    Location [facility]    Staff Present Algis Downs, Exercise Physiologist;Meria Crilly Wynetta Emery, RN, Bjorn Loser, MS, ACSM-CEP, Exercise Physiologist    Virtual Visit No    Medication changes reported     No    Fall or balance concerns reported    No    Tobacco Cessation No Change    Warm-up and Cool-down Performed as group-led instruction    Resistance Training Performed Yes    VAD Patient? No    PAD/SET Patient? No      Pain Assessment   Currently in Pain? No/denies    Pain Score 0-No pain    Multiple Pain Sites No           Capillary Blood Glucose: No results found for this or any previous visit (from the past 24 hour(s)).    Social History   Tobacco Use  Smoking Status Former Smoker  . Packs/day: 0.50  . Years: 5.00  . Pack years: 2.50  . Types: Cigarettes  . Quit date: 03/09/1976  . Years since quitting: 43.5  Smokeless Tobacco Never Used    Goals Met:  Independence with exercise equipment Exercise tolerated well No report of cardiac concerns or symptoms Strength training completed today  Goals Unmet:  Not Applicable  Comments: Check out 1200.   Dr. Kate Sable is Medical Director for [facility] Cardiac and Pulmonary Re[facility].

## 2019-08-31 NOTE — Chronic Care Management (AMB) (Signed)
Chronic Care Management    Clinical Social Work Follow Up Note  08/31/2019 Name: Gabriela Smith MRN: [id number] DOB: Dec 26, 1940  Gabriela Smith is a 79 y.o. year old female who is a primary care patient of [name], DO. The CCM team was consulted for assistance with [employer] .   Review of patient status, including review of consultants reports, other relevant assessments, and collaboration with appropriate care team members and the patient's provider was performed as part of comprehensive patient evaluation and provision of chronic care management services.    SDOH (Social Determinants of Health) assessments performed: No; risk for tobacco use; risk for financial strain; risk for depression; risk for stress    CARDIAC REHAB PHASE II ORIENTATION from 08/29/2019 in [place]  PHQ-9 Total Score 1       GAD 7 : Generalized Anxiety Score 07/06/2019  Nervous, Anxious, on Edge 0  Control/stop worrying 0  Worry too much - different things 0  Trouble relaxing 0  Restless 1  Easily annoyed or irritable 0  Afraid - awful might happen 0  Total GAD 7 Score 1  Anxiety Difficulty Not difficult at all    Outpatient Encounter Medications as of 08/31/2019  Medication Sig Note  . acetaminophen (TYLENOL) 500 MG tablet Take 2 tablets (1,000 mg total) by mouth every 8 (eight) hours. (Patient taking differently: Take 1,000 mg by mouth at bedtime as needed for headache (pain/sleep). )   . amiodarone (PACERONE) 200 MG tablet Take 1 tablet (200 mg total) by mouth 2 (two) times daily. For 2 days, then decrease to 200 mg daily (Patient taking differently: Take 200 mg by mouth at bedtime. )   . amLODipine (NORVASC) 2.5 MG tablet Take 1 tablet (2.5 mg total) by mouth daily.   [name] apixaban (ELIQUIS) 5 MG TABS tablet Take 1 tablet (5 mg total) by mouth 2 (two) times daily.   [name] aspirin EC 81 MG EC tablet Take 1 tablet (81 mg total) by mouth daily.   [name] b complex vitamins  tablet Take 1 tablet by mouth daily after breakfast.  (Patient not taking: Reported on 08/31/2019)   . cholecalciferol (VITAMIN D) 1000 UNITS tablet Take 1,000 Units by mouth daily after breakfast.    . Co-Enzyme Q10 200 MG CAPS Take 200 mg by mouth daily after breakfast.    . digoxin (LANOXIN) 0.125 MG tablet Take 1 tablet (0.125 mg total) by mouth daily.   . Evolocumab (REPATHA SURECLICK) 703 MG/ML SOAJ Inject 1 Dose into the skin every 14 (fourteen) days.   . Flaxseed, Linseed, (FLAX SEED OIL) 1000 MG CAPS Take 1,000 mg by mouth daily after breakfast.    . gabapentin (NEURONTIN) 100 MG capsule Take 1 capsule (100 mg total) by mouth at bedtime. 04/15/2019: Pt has been taking for a couple weeks - wondering if this caused the current shortness of breath   . Magnesium 300 MG CAPS Take 300 mg by mouth at bedtime.    . metoprolol tartrate (LOPRESSOR) 50 MG tablet TAKE (1) TABLET TWICE A DAY. (Patient taking differently: Take 50 mg by mouth 2 (two) times daily. TAKE (1) TABLET TWICE A DAY.)   . nitroGLYCERIN (NITROSTAT) 0.4 MG SL tablet PLACE 1 TABLET UNDER THE TONGUE AT ONSET OF CHEST PAIN EVERY 5 MINTUES UP TO 3 TIMES AS NEEDED (Patient taking differently: Place 0.4 mg under the tongue every 5 (five) minutes as needed for chest pain. )   . OVER THE  COUNTER MEDICATION Take 1 tablet by mouth daily after breakfast. Elderberry/Vitamin C   . traMADol (ULTRAM) 50 MG tablet Take 50 mg by mouth every 4 (four) hours as needed for moderate pain.   . TURMERIC PO Take 1 capsule by mouth daily after breakfast.   . vitamin B-12 (CYANOCOBALAMIN) 500 MCG tablet Take 1 tablet (500 mcg total) by mouth daily.    Facility-Administered Encounter Medications as of 08/31/2019  Medication  . DOBUTamine (DOBUTREX) 1,000 mcg/mL in dextrose 5% 250 mL infusion     Goals    .  Client will talk with LCSW in next 30 days to discuss client completion of ADLs and dialy tasks (pt-stated)      CARE PLAN ENTRY  Current Barriers:   [name] Mobility challenges in client with Chronic Diagnoses of CHF, HLD, HTN, OA, DDD, Vitamin D deficiency, AAA, s/p CABG X 4 . Short of breath on occasion   Clinical Social Work Clinical Goal(s):  [name] LCSW to call client in next 30 days to tidsucs client completion of ADLs and daily activities  Interventions: . Talked with client about CCM program support . Talked with client about her mobility challenges . Talked with client about pain issues . Taked with clinet about her breathing challenges (occasional short of breath) . Talked with client about her heart surgery (heart surgery in February of 2021) . Talked with client aobut her upcoming client appointments . Talked with Letta Median about relaxation techniques of choice Belcher likes to cook , she likes to listen to music, and likes to read) . Provided counseling support for client  . Talked with Rebeccah about her family history of heart issues  Talked with Letta Median about health needs of her spouse  Encouraged Azzure to call RNCM as needed to discuss general nursing needs of client  Talked with Rori about her physical therapy sessions  Talked with Najia about social support network (has support from her daughter)  Talked with Roseann about sleeping issues of client (she takes a nap every day)  Talked with Letta Median about meal provision for client  Patient Self Care Activities:  Takes medications as prescribed Attends scheduled medical appointments  . Patient Self Care Deficits:  [name] Mobility challenges . Shortness of breath occasionally   Initial goal documentation    Follow Up Plan: LCSW to call client in next 4 weeks to talk with her about her completion of daily ADLs and daily activities  [name].Osman Calzadilla MSW, LCSW Licensed Clinical Social Worker [facility] [phone]

## 2019-08-31 NOTE — Progress Notes (Signed)
Cardiac/Pulmonary Rehab Medication Review by a Pharmacist  Does the patient  feel that his/her medications are working for him/her?  yes  Has the patient been experiencing any side effects to the medications prescribed?  no  Does the patient measure his/her own blood pressure or blood glucose at home?  yes   Does the patient have any problems obtaining medications due to transportation or finances?   no  Understanding of regimen: good Understanding of indications: good Potential of compliance: good  Questions asked to Determine Patient Understanding of Medication Regimen:  1. What is the name of the medication?  2. What is the medication used for?  3. When should it be taken?  4. How much should be taken?  5. How will you take it?  6. What side effects should you report?  Understanding Defined as: Excellent: All questions above are correct Good: Questions 1-4 are correct Fair: Questions 1-2 are correct  Poor: 1 or none of the above questions are correct   Pharmacist comments: Overall, patient has high compliance and understanding of regimen.  Discussed medications and how they relate to her atrial fibrillation and heart issues.  Answered all of patient questions.    Ramond Craver 08/31/2019 12:03 PM

## 2019-08-31 NOTE — Patient Instructions (Addendum)
Licensed Clinical Social Worker Visit Information  Goals we discussed today:     Client will talk with LCSW in next 30 days to discuss client completion of ADLs and dialy tasks (pt-stated)         CARE PLAN ENTRY  Current Barriers:   Mobility challenges in client with Chronic Diagnoses of CHF, HLD, HTN, OA, DDD, Vitamin D deficiency, AAA, s/p CABG X 4  Short of breath on occasion   Clinical Social Work Clinical Goal(s):   LCSW to call client in next 30 days to tidsucs client completion of ADLs and daily activities  Interventions:  Talked with client about CCM program support  Talked with client about her mobility challenges  Talked with client about pain issues  Taked with clinet about her breathing challenges (occasional short of breath)  Talked with client about her heart surgery (heart surgery in February of 2021)  Talked with client aobut her upcoming client appointments  Talked with Letta Median about relaxation techniques of choice Therien likes to [employer] , she likes to listen to music, and likes to read)  Provided counseling support for client   Talked with Shanyla about her family history of heart issues  Talked with Letta Median about health needs of her spouse  Encouraged Hanaan to call RNCM as needed to discuss general nursing needs of client  Talked with Adam about her physical therapy sessions  Talked with Nalah about social support network (has support from her daughter)  Talked with Davida about sleeping issues of client (she takes a nap every day)  Talked with Letta Median about meal provision for client  Patient Self Care Activities:  Takes medications as prescribed Attends scheduled medical appointments   Patient Self Care Deficits:   Mobility challenges  Shortness of breath occasionally   Initial goal documentation    Follow Up Plan: LCSW to call client in next 4 weeks to talk with her about her completion of daily ADLs and daily activities  Materials  Provided: No  The patient verbalized understanding of instructions provided today and declined a print copy of patient instruction materials.   [name].Daneen Volcy MSW, LCSW Licensed Clinical Social Worker [facility] [phone]

## 2019-09-02 ENCOUNTER — Encounter (HOSPITAL_COMMUNITY)
Admission: RE | Admit: 2019-09-02 | Discharge: 2019-09-02 | Disposition: A | Discharge status: Home / Self Care | Payer: Medicare Other | Source: Ambulatory Visit | Attending: Cardiology | Admitting: Cardiology

## 2019-09-02 ENCOUNTER — Other Ambulatory Visit: Payer: Self-pay

## 2019-09-02 DIAGNOSIS — Z951 Presence of aortocoronary bypass graft: Secondary | ICD-10-CM

## 2019-09-02 NOTE — Progress Notes (Signed)
Daily Session Note  Patient Details  Name: Gabriela Smith MRN: [id number] Date of Birth: 06/09/1940 Referring Provider:     CARDIAC REHAB PHASE II ORIENTATION from 08/29/2019 in [place] [facility]  Referring Provider Dr. Hochrein      Encounter Date: 09/02/2019  Check In:  Session Check In - 09/02/19 1117      Check-In   Supervising physician immediately available to respond to emergencies See telemetry face sheet for immediately available ER MD    Location [facility]    Staff Present Vaniece Hatchett, Exercise Physiologist; , MS, ACSM-CEP, Exercise Physiologist    Virtual Visit No    Medication changes reported     No    Fall or balance concerns reported    No    Tobacco Cessation No Change    Warm-up and Cool-down Performed as group-led instruction    Resistance Training Performed Yes    VAD Patient? No    PAD/SET Patient? No      Pain Assessment   Currently in Pain? No/denies    Pain Score 0-No pain    Multiple Pain Sites No           Capillary Blood Glucose: No results found for this or any previous visit (from the past 24 hour(s)).    Social History   Tobacco Use  Smoking Status Former Smoker  . Packs/day: 0.50  . Years: 5.00  . Pack years: 2.50  . Types: Cigarettes  . Quit date: 03/09/1976  . Years since quitting: 43.5  Smokeless Tobacco Never Used    Goals Met:  Independence with exercise equipment Exercise tolerated well No report of cardiac concerns or symptoms Strength training completed today  Goals Unmet:  Not Applicable  Comments: checkout time is 1200   Dr. Suresh Koneswaran is Medical Director for [facility]. 

## 2019-09-05 ENCOUNTER — Encounter (HOSPITAL_COMMUNITY)
Admission: RE | Admit: 2019-09-05 | Discharge: 2019-09-05 | Disposition: A | Discharge status: Home / Self Care | Payer: Medicare Other | Source: Ambulatory Visit | Attending: Cardiology | Admitting: Cardiology

## 2019-09-05 ENCOUNTER — Other Ambulatory Visit: Payer: Self-pay

## 2019-09-05 VITALS — Wt 176.4 lb

## 2019-09-05 DIAGNOSIS — Z951 Presence of aortocoronary bypass graft: Secondary | ICD-10-CM

## 2019-09-05 NOTE — Progress Notes (Signed)
Daily Session Note  Patient Details  Name: Gabriela Smith MRN: [id number] Date of Birth: 1940/03/28 Referring Provider:     CARDIAC REHAB PHASE II ORIENTATION from 08/29/2019 in [place]  Referring Provider Dr. Percival Spanish      Encounter Date: 09/05/2019  Check In:  Session Check In - 09/05/19 1100      Check-In   Supervising physician immediately available to respond to emergencies See telemetry face sheet for immediately available MD    Location [facility]    Staff Present Aundra Dubin, RN, BSN;Cashton Hosley, Exercise Physiologist;Dalton Kris Mouton, MS, ACSM-CEP, Exercise Physiologist    Virtual Visit No    Medication changes reported     No    Fall or balance concerns reported    No    Tobacco Cessation No Change    Warm-up and Cool-down Performed as group-led instruction    Resistance Training Performed Yes    VAD Patient? No    PAD/SET Patient? No      Pain Assessment   Currently in Pain? No/denies    Pain Score 0-No pain    Multiple Pain Sites No           Capillary Blood Glucose: No results found for this or any previous visit (from the past 24 hour(s)).    Social History   Tobacco Use  Smoking Status Former Smoker  . Packs/day: 0.50  . Years: 5.00  . Pack years: 2.50  . Types: Cigarettes  . Quit date: 03/09/1976  . Years since quitting: 43.5  Smokeless Tobacco Never Used    Goals Met:  Independence with exercise equipment Exercise tolerated well No report of cardiac concerns or symptoms Strength training completed today  Goals Unmet:  Not Applicable  Comments: Check out: 1200   Dr. Kate Sable is Medical Director for [place] and [facility].

## 2019-09-07 ENCOUNTER — Other Ambulatory Visit: Payer: Self-pay

## 2019-09-07 ENCOUNTER — Encounter (HOSPITAL_COMMUNITY)
Admission: RE | Admit: 2019-09-07 | Discharge: 2019-09-07 | Disposition: A | Discharge status: Home / Self Care | Payer: Medicare Other | Source: Ambulatory Visit | Attending: Cardiology | Admitting: Cardiology

## 2019-09-07 DIAGNOSIS — Z951 Presence of aortocoronary bypass graft: Secondary | ICD-10-CM

## 2019-09-07 NOTE — Progress Notes (Signed)
Daily Session Note  Patient Details  Name: CHRYSTEL BAREFIELD MRN: [id number] Date of Birth: 1940-06-06 Referring Provider:     CARDIAC REHAB PHASE II ORIENTATION from 08/29/2019 in [place]  Referring Provider Dr. Percival Spanish      Encounter Date: 09/07/2019  Check In:  Session Check In - 09/07/19 1107      Check-In   Supervising physician immediately available to respond to emergencies See telemetry face sheet for immediately available MD    Location [facility]    Staff Present Algis Downs, Exercise Physiologist;Debra Wynetta Emery, RN, BSN    Virtual Visit No    Medication changes reported     No    Fall or balance concerns reported    No    Tobacco Cessation No Change    Warm-up and Cool-down Performed as group-led instruction    Resistance Training Performed Yes    VAD Patient? No    PAD/SET Patient? No      Pain Assessment   Currently in Pain? No/denies    Pain Score 0-No pain    Multiple Pain Sites No           Capillary Blood Glucose: No results found for this or any previous visit (from the past 24 hour(s)).    Social History   Tobacco Use  Smoking Status Former Smoker  . Packs/day: 0.50  . Years: 5.00  . Pack years: 2.50  . Types: Cigarettes  . Quit date: 03/09/1976  . Years since quitting: 43.5  Smokeless Tobacco Never Used    Goals Met:  Independence with exercise equipment Exercise tolerated well No report of cardiac concerns or symptoms Strength training completed today  Goals Unmet:  Not Applicable  Comments: Check out: 1200   Dr. Kate Sable is Medical Director for [place] and [facility] Re[facility].

## 2019-09-09 ENCOUNTER — Encounter (HOSPITAL_COMMUNITY)
Admission: RE | Admit: 2019-09-09 | Discharge: 2019-09-09 | Disposition: A | Discharge status: Home / Self Care | Payer: Medicare Other | Source: Ambulatory Visit | Attending: Cardiology | Admitting: Cardiology

## 2019-09-09 ENCOUNTER — Other Ambulatory Visit: Payer: Self-pay

## 2019-09-09 DIAGNOSIS — Z951 Presence of aortocoronary bypass graft: Secondary | ICD-10-CM | POA: Insufficient documentation

## 2019-09-09 NOTE — Progress Notes (Signed)
Daily Session Note  Patient Details  Name: SERENNA DEROY MRN: [id number] Date of Birth: May 20, 1940 Referring Provider:     CARDIAC REHAB PHASE II ORIENTATION from 08/29/2019 in [place]  Referring Provider Dr. Percival Spanish      Encounter Date: 09/09/2019  Check In:  Session Check In - 09/09/19 1104      Check-In   Supervising physician immediately available to respond to emergencies See telemetry face sheet for immediately available MD    Location [facility]    Staff Present Algis Downs, Exercise Physiologist;Janard Culp Kris Mouton, MS, ACSM-CEP, Exercise Physiologist    Virtual Visit No    Medication changes reported     No    Fall or balance concerns reported    No    Tobacco Cessation No Change    Warm-up and Cool-down Performed as group-led instruction    Resistance Training Performed No   elevated BP upon arrival. Pt sat during warm up and weights, blood pressure decreased to an acceptable level and was then allowed to exercise.   VAD Patient? No    PAD/SET Patient? No      Pain Assessment   Currently in Pain? No/denies    Pain Score 0-No pain    Multiple Pain Sites No           Capillary Blood Glucose: No results found for this or any previous visit (from the past 24 hour(s)).    Social History   Tobacco Use  Smoking Status Former Smoker  . Packs/day: 0.50  . Years: 5.00  . Pack years: 2.50  . Types: Cigarettes  . Quit date: 03/09/1976  . Years since quitting: 43.5  Smokeless Tobacco Never Used    Goals Met:  Independence with exercise equipment Exercise tolerated well Strength training completed today  Goals Unmet:  BP  Comments: checkout time is 1200   Dr. Kate Sable is Medical Director for [place] and [facility] Re[facility].

## 2019-09-12 ENCOUNTER — Encounter (HOSPITAL_COMMUNITY): Payer: Medicare Other

## 2019-09-14 ENCOUNTER — Other Ambulatory Visit: Payer: Self-pay

## 2019-09-14 ENCOUNTER — Encounter (HOSPITAL_COMMUNITY)
Admission: RE | Admit: 2019-09-14 | Discharge: 2019-09-14 | Disposition: A | Discharge status: Home / Self Care | Payer: Medicare Other | Source: Ambulatory Visit | Attending: Cardiology | Admitting: Cardiology

## 2019-09-14 DIAGNOSIS — Z951 Presence of aortocoronary bypass graft: Secondary | ICD-10-CM

## 2019-09-14 NOTE — Progress Notes (Signed)
Daily Session Note  Patient Details  Name: Gabriela Smith MRN: [id number] Date of Birth: 16-Sep-1940 Referring Provider:     CARDIAC REHAB PHASE II ORIENTATION from 08/29/2019 in [place]  Referring Provider Dr. Percival Spanish      Encounter Date: 09/14/2019  Check In:  Session Check In - 09/14/19 1052      Check-In   Supervising physician immediately available to respond to emergencies See telemetry face sheet for immediately available MD    Location [facility]    Staff Present Algis Downs, Exercise Physiologist;Burna Atlas Kris Mouton, MS, ACSM-CEP, Exercise Physiologist    Virtual Visit No    Medication changes reported     No    Fall or balance concerns reported    No    Tobacco Cessation No Change    Warm-up and Cool-down Performed as group-led instruction    Resistance Training Performed Yes    VAD Patient? No    PAD/SET Patient? No      Pain Assessment   Currently in Pain? No/denies    Pain Score 0-No pain    Multiple Pain Sites No           Capillary Blood Glucose: No results found for this or any previous visit (from the past 24 hour(s)).    Social History   Tobacco Use  Smoking Status Former Smoker  . Packs/day: 0.50  . Years: 5.00  . Pack years: 2.50  . Types: Cigarettes  . Quit date: 03/09/1976  . Years since quitting: 43.5  Smokeless Tobacco Never Used    Goals Met:  Independence with exercise equipment Exercise tolerated well No report of cardiac concerns or symptoms Strength training completed today  Goals Unmet:  Not Applicable  Comments: checkout time is 1200   Dr. Kathie Dike is Medical Director for [facility].

## 2019-09-14 NOTE — Progress Notes (Addendum)
Cardiac Individual Treatment Plan  Patient Details  Name: Gabriela Smith MRN: [id number] Date of Birth: September 05, 1940 Referring Provider:     [place] from 08/29/2019 in [place]  Referring Provider Dr. Percival Spanish      Initial Encounter Date:    CARDIAC REHAB PHASE II ORIENTATION from 08/29/2019 in [place]  Date 08/29/19      Visit Diagnosis: S/P CABG x 4  Patient's Home Medications on Admission:  Current Outpatient Medications:  .  acetaminophen (TYLENOL) 500 MG tablet, Take 2 tablets (1,000 mg total) by mouth every 8 (eight) hours. (Patient taking differently: Take 1,000 mg by mouth at bedtime as needed for headache (pain/sleep). ), Disp: 30 tablet, Rfl: 0 .  amiodarone (PACERONE) 200 MG tablet, Take 1 tablet (200 mg total) by mouth 2 (two) times daily. For 2 days, then decrease to 200 mg daily (Patient taking differently: Take 200 mg by mouth at bedtime. ), Disp: 60 tablet, Rfl: 1 .  amLODipine (NORVASC) 2.5 MG tablet, Take 1 tablet (2.5 mg total) by mouth daily., Disp: 90 tablet, Rfl: 3 .  apixaban (ELIQUIS) 5 MG TABS tablet, Take 1 tablet (5 mg total) by mouth 2 (two) times daily., Disp: 60 tablet, Rfl: 6 .  aspirin EC 81 MG EC tablet, Take 1 tablet (81 mg total) by mouth daily., Disp:  , Rfl:  .  b complex vitamins tablet, Take 1 tablet by mouth daily after breakfast.  (Patient not taking: Reported on 08/31/2019), Disp: , Rfl:  .  cholecalciferol (VITAMIN D) 1000 UNITS tablet, Take 1,000 Units by mouth daily after breakfast. , Disp: , Rfl:  .  Co-Enzyme Q10 200 MG CAPS, Take 200 mg by mouth daily after breakfast. , Disp: , Rfl:  .  digoxin (LANOXIN) 0.125 MG tablet, Take 1 tablet (0.125 mg total) by mouth daily., Disp: 30 tablet, Rfl: 3 .  Evolocumab (REPATHA SURECLICK) 628 MG/ML SOAJ, Inject 1 Dose into the skin every 14 (fourteen) days., Disp: 2 pen, Rfl: 11 .  Flaxseed, Linseed, (FLAX SEED OIL) 1000 MG CAPS,  Take 1,000 mg by mouth daily after breakfast. , Disp: , Rfl:  .  gabapentin (NEURONTIN) 100 MG capsule, Take 1 capsule (100 mg total) by mouth at bedtime., Disp: 90 capsule, Rfl: 3 .  Magnesium 300 MG CAPS, Take 300 mg by mouth at bedtime. , Disp: , Rfl:  .  metoprolol tartrate (LOPRESSOR) 50 MG tablet, TAKE (1) TABLET TWICE A DAY. (Patient taking differently: Take 50 mg by mouth 2 (two) times daily. TAKE (1) TABLET TWICE A DAY.), Disp: 180 tablet, Rfl: 3 .  nitroGLYCERIN (NITROSTAT) 0.4 MG SL tablet, PLACE 1 TABLET UNDER THE TONGUE AT ONSET OF CHEST PAIN EVERY 5 MINTUES UP TO 3 TIMES AS NEEDED (Patient taking differently: Place 0.4 mg under the tongue every 5 (five) minutes as needed for chest pain. ), Disp: 25 tablet, Rfl: 0 .  OVER THE COUNTER MEDICATION, Take 1 tablet by mouth daily after breakfast. Elderberry/Vitamin C, Disp: , Rfl:  .  traMADol (ULTRAM) 50 MG tablet, Take 50 mg by mouth every 4 (four) hours as needed for moderate pain., Disp: , Rfl:  .  TURMERIC PO, Take 1 capsule by mouth daily after breakfast., Disp: , Rfl:  .  vitamin B-12 (CYANOCOBALAMIN) 500 MCG tablet, Take 1 tablet (500 mcg total) by mouth daily., Disp: 30 tablet, Rfl: 3 No current facility-administered medications for this encounter.  Facility-Administered Medications Ordered in Other  Encounters:  .  DOBUTamine (DOBUTREX) 1,000 mcg/mL in dextrose 5% 250 mL infusion, 20 mcg/kg/min, Intravenous, Continuous, Dorothy Spark, MD, Last Rate: 96.4 mL/hr at 12/24/15 0840, 20 mcg/kg/min at 12/24/15 0840  Past Medical History: Past Medical History:  Diagnosis Date  . AAA (abdominal aortic aneurysm) [facility])    cardiology aware   . Arthritis    OA lt knee- cortizone inj. q 4 months AND OA ALSO IN BACK. Had knee replaced-issue resolved  . CAD (coronary artery disease) CARDIOLOGIST- [facility]--  VISIT 01-02-10 IN [facility]   1993-- PTCA. Pt describes a near total blockage of apparently the LAD and a 65% stenosis elsewhere.  .  Cancer ([place])   . History of bladder cancer followed by dr Karsten Ro   hx  TCC of bladder ,  Ta G3-  first occurence 02-20-2012--  s/p BCG tx's  and  mitomycin C  . Hyperlipidemia 1993  . Hypertension 1993  . Nocturia   . Osteopenia   . Sigmoid diverticulosis     Tobacco Use: Social History   Tobacco Use  Smoking Status Former Smoker  . Packs/day: 0.50  . Years: 5.00  . Pack years: 2.50  . Types: Cigarettes  . Quit date: 03/09/1976  . Years since quitting: 43.5  Smokeless Tobacco Never Used    Labs: Recent Chemical engineer    Labs for ITP Cardiac and Pulmonary Rehab Latest Ref Rng & Units 04/25/2019 04/25/2019 04/25/2019 04/28/2019 08/12/2019   Cholestrol 100 - 199 mg/dL - - - - 144   LDLCALC 0 - 99 mg/dL - - - - 62   LDLDIRECT 0 - 99 mg/dL - - - - -   HDL >39 mg/dL - - - - 46   Trlycerides 0 - 149 mg/dL - - - - 224(H)   Hemoglobin A1c 4.8 - 5.6 % - - - - -   PHART 7.35 - 7.45 7.303(L) 7.344(L) 7.349(L) - -   PCO2ART 32 - 48 mmHg 41.8 35.2 40.8 - -   HCO3 20.0 - 28.0 mmol/L 20.9 19.3(L) 22.6 24.0 -   TCO2 22 - 32 mmol/L 22 20(L) 24 25 -   ACIDBASEDEF 0.0 - 2.0 mmol/L 5.0(H) 6.0(H) 3.0(H) 1.0 -   O2SAT % 96.0 99.0 98.0 59.0 -      Capillary Blood Glucose: Lab Results  Component Value Date   GLUCAP 108 (H) 05/02/2019   GLUCAP 96 05/01/2019   GLUCAP 122 (H) 05/01/2019   GLUCAP 102 (H) 05/01/2019   GLUCAP 93 05/01/2019     Exercise Target Goals: Exercise Program Goal: Individual exercise prescription set using results from initial 6 min walk test and THRR while considering  patient's activity barriers and safety.   Exercise Prescription Goal: Starting with aerobic activity 30 plus minutes a day, 3 days per week for initial exercise prescription. Provide home exercise prescription and guidelines that participant acknowledges understanding prior to discharge.  Activity Barriers & Risk Stratification:  Activity Barriers & Cardiac Risk Stratification - 08/29/19 1207       Activity Barriers & Cardiac Risk Stratification   Activity Barriers Deconditioning    Cardiac Risk Stratification High           6 Minute Walk:  6 Minute Walk    Row Name 08/29/19 1158         6 Minute Walk   Phase Initial     Distance 1200 feet     Walk Time 6 minutes     # of Rest  Breaks 0     MPH 2.27     METS 2.24     RPE 13     VO2 Peak 7.83     Symptoms No     Resting HR 62 bpm     Resting BP 170/64     Resting Oxygen Saturation  95 %     Exercise Oxygen Saturation  during 6 min walk 2.39 %     Max Ex. HR 96 bpm     Max Ex. BP 182/76     2 Minute Post BP 158/64            Oxygen Initial Assessment:   Oxygen Re-Evaluation:   Oxygen Discharge (Final Oxygen Re-Evaluation):   Initial Exercise Prescription:  Initial Exercise Prescription - 08/29/19 1200      Date of Initial Exercise RX and Referring Provider   Date 08/29/19    Referring Provider Dr. Percival Spanish    Expected Discharge Date 11/29/19      Treadmill   MPH 1.5    Grade 0    Minutes 17    METs 2.14      T5 Nustep   Level 1    SPM 80    Minutes 22      Prescription Details   Frequency (times per week) 3    Duration Progress to 30 minutes of continuous aerobic without signs/symptoms of physical distress      Intensity   THRR 40-80% of Max Heartrate 56-112    Ratings of Perceived Exertion 11-13      Progression   Progression Continue progressive overload as per policy without signs/symptoms or physical distress.      Resistance Training   Training Prescription Yes    Weight 1    Reps 10-15           Perform Capillary Blood Glucose checks as needed.  Exercise Prescription Changes:   Exercise Prescription Changes    Row Name 09/05/19 1100             Response to Exercise   Blood Pressure (Admit) 144/78       Blood Pressure (Exercise) 150/66       Blood Pressure (Exit) 144/74       Heart Rate (Admit) 70 bpm       Heart Rate (Exercise) 87 bpm       Heart Rate  (Exit) 78 bpm       Rating of Perceived Exertion (Exercise) 13       Duration Continue with 30 min of aerobic exercise without signs/symptoms of physical distress.       Intensity THRR unchanged         Progression   Progression Continue to progress workloads to maintain intensity without signs/symptoms of physical distress.         Resistance Training   Training Prescription Yes       Weight 1       Reps 10-15         Treadmill   MPH 1.5       Grade 0       Minutes 17       METs 2.15         T5 Nustep   Level 1       SPM 85       Minutes 22       METs 1.3              Exercise Comments:  Exercise Goals and Review:   Exercise Goals    Row Name 08/29/19 1205             Exercise Goals   Increase Physical Activity Yes       Intervention Develop an individualized exercise prescription for aerobic and resistive training based on initial evaluation findings, risk stratification, comorbidities and participant's personal goals.;Provide advice, education, support and counseling about physical activity/exercise needs.       Expected Outcomes Short Term: Attend rehab on a regular basis to increase amount of physical activity.;Long Term: Add in home exercise to make exercise part of routine and to increase amount of physical activity.;Long Term: Exercising regularly at least 3-5 days a week.       Increase Strength and Stamina Yes       Intervention Provide advice, education, support and counseling about physical activity/exercise needs.;Develop an individualized exercise prescription for aerobic and resistive training based on initial evaluation findings, risk stratification, comorbidities and participant's personal goals.       Expected Outcomes Short Term: Increase workloads from initial exercise prescription for resistance, speed, and METs.;Short Term: Perform resistance training exercises routinely during rehab and add in resistance training at home;Long Term: Improve  cardiorespiratory fitness, muscular endurance and strength as measured by increased METs and functional capacity (6MWT)       Able to understand and use rate of perceived exertion (RPE) scale Yes       Intervention Provide education and explanation on how to use RPE scale       Expected Outcomes Short Term: Able to use RPE daily in rehab to express subjective intensity level;Long Term:  Able to use RPE to guide intensity level when exercising independently       Knowledge and understanding of Target Heart Rate Range (THRR) Yes       Intervention Provide education and explanation of THRR including how the numbers were predicted and where they are located for reference       Expected Outcomes Short Term: Able to state/look up THRR;Long Term: Able to use THRR to govern intensity when exercising independently;Short Term: Able to use daily as guideline for intensity in rehab       Able to check pulse independently Yes       Intervention Provide education and demonstration on how to check pulse in carotid and radial arteries.;Review the importance of being able to check your own pulse for safety during independent exercise       Expected Outcomes Short Term: Able to explain why pulse checking is important during independent exercise;Long Term: Able to check pulse independently and accurately       Understanding of Exercise Prescription Yes       Intervention Provide education, explanation, and written materials on patient's individual exercise prescription       Expected Outcomes Short Term: Able to explain program exercise prescription;Long Term: Able to explain home exercise prescription to exercise independently              Exercise Goals Re-Evaluation :  Exercise Goals Re-Evaluation    [place] Name 09/09/19 1414             Exercise Goal Re-Evaluation   Exercise Goals Review Increase Physical Activity;Increase Strength and Stamina       Comments She has only done six session so far in the  program. She said that she can tell that she has more energy. She is motivated to meet her goals and has a  positve attitude. Her Systolic BP tend to run above 180 before sessions. Her doctor was notified about this upon entry to program. We will continue to monitor high BP and progress as tolerated.       Expected Outcomes To reach expected outcomes               Discharge Exercise Prescription (Final Exercise Prescription Changes):  Exercise Prescription Changes - 09/05/19 1100      Response to Exercise   Blood Pressure (Admit) 144/78    Blood Pressure (Exercise) 150/66    Blood Pressure (Exit) 144/74    Heart Rate (Admit) 70 bpm    Heart Rate (Exercise) 87 bpm    Heart Rate (Exit) 78 bpm    Rating of Perceived Exertion (Exercise) 13    Duration Continue with 30 min of aerobic exercise without signs/symptoms of physical distress.    Intensity THRR unchanged      Progression   Progression Continue to progress workloads to maintain intensity without signs/symptoms of physical distress.      Resistance Training   Training Prescription Yes    Weight 1    Reps 10-15      Treadmill   MPH 1.5    Grade 0    Minutes 17    METs 2.15      T5 Nustep   Level 1    SPM 85    Minutes 22    METs 1.3           Nutrition:  Target Goals: Understanding of nutrition guidelines, daily intake of sodium 1500mg , cholesterol 200mg , calories 30% from fat and 7% or less from saturated fats, daily to have 5 or more servings of fruits and vegetables.  Biometrics:  Pre Biometrics - 08/29/19 1206      Pre Biometrics   Height 5\' 3"  (1.6 m)    Weight 80 kg    Waist Circumference 41 inches    Hip Circumference 47 inches    Waist to Hip Ratio 0.87 %    BMI (Calculated) 31.25    Triceps Skinfold 26 mm    % Body Fat 43.7 %    Grip Strength 26 kg    Flexibility 0 in    Single Leg Stand 3.16 seconds            Nutrition Therapy Plan and Nutrition Goals:  Nutrition Therapy & Goals -  09/07/19 1617      Personal Nutrition Goals   Comments We continue to work with our RD to schedule classes. In the intermin, we are providing education through hand-outs. She continues to follow a low sodium diet. Will continue to monitor.      Intervention Plan   Intervention Nutrition handout(s) given to patient.           Nutrition Assessments:  Nutrition Assessments - 08/29/19 1251      MEDFICTS Scores   Pre Score 21           Nutrition Goals Re-Evaluation:   Nutrition Goals Discharge (Final Nutrition Goals Re-Evaluation):   Psychosocial: Target Goals: Acknowledge presence or absence of significant depression and/or stress, maximize coping skills, provide positive support system. Participant is able to verbalize types and ability to use techniques and skills needed for reducing stress and depression.  Initial Review & Psychosocial Screening:  Initial Psych Review & Screening - 08/29/19 1246      Initial Review   Current issues with None Identified      Family  Dynamics   Good Support System? Yes      Barriers   Psychosocial barriers to participate in program There are no identifiable barriers or psychosocial needs.      Screening Interventions   Interventions Encouraged to exercise    Expected Outcomes Short Term goal: Identification and review with participant of any Quality of Life or Depression concerns found by scoring the questionnaire.;Long Term goal: The participant improves quality of Life and PHQ9 Scores as seen by post scores and/or verbalization of changes           Quality of Life Scores:  Quality of Life - 08/29/19 1119      Quality of Life   Select Quality of Life      Quality of Life Scores   Health/Function Pre 27.21 %    Socioeconomic Post 29 %    Psych/Spiritual Pre 29.14 %    Family Pre 28.8 %    GLOBAL Pre 28.48 %          Scores of 19 and below usually indicate a poorer quality of life in these areas.  A difference of  2-3  points is a clinically meaningful difference.  A difference of 2-3 points in the total score of the Quality of Life Index has been associated with significant improvement in overall quality of life, self-image, physical symptoms, and general health in studies assessing change in quality of life.  PHQ-9: Recent Review Flowsheet Data    Depression screen [facility] 2/9 08/29/2019 08/12/2019 07/06/2019 02/17/2019 12/02/2018   Decreased Interest 0 0 0 0 0   Down, Depressed, Hopeless 0 0 0 0 0   PHQ - 2 Score 0 0 0 0 0   Altered sleeping 1 0 1 - -   Tired, decreased energy 0 0 0 - -   Change in appetite 0 0 1 - -   Feeling bad or failure about yourself  0 0 0 - -   Trouble concentrating 0 0 1 - -   Moving slowly or fidgety/restless 0 0 1 - -   Suicidal thoughts 0 0 0 - -   PHQ-9 Score 1 0 4 - -   Difficult doing work/chores Not difficult at all - Somewhat difficult - -     Interpretation of Total Score  Total Score Depression Severity:  1-4 = Minimal depression, 5-9 = Mild depression, 10-14 = Moderate depression, 15-19 = Moderately severe depression, 20-27 = Severe depression   Psychosocial Evaluation and Intervention:  Psychosocial Evaluation - 08/29/19 1247      Psychosocial Evaluation & Interventions   Interventions Encouraged to exercise with the program and follow exercise prescription;Stress management education;Relaxation education    Comments Patient's initial QOL score was 28.48 an her PHQ-9 score was 1 with no psychosocial issues identified. 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 She rates her stress level as low. She says she is able to care for him without difficulty and plans to bring him with her to the sessions. Will continue to monitor.    Expected Outcomes Patient will have no psychosocial issues identified at discharge.    Continue Psychosocial Services  No Follow up required           Psychosocial Re-Evaluation:  Psychosocial  Re-Evaluation    [place] Name 09/07/19 1618             Psychosocial Re-Evaluation   Current issues with None Identified  Comments Patient's initial QOL score was 28.48 and her PHQ-9 score was 1 with no psychosocial issues identified. She continues to be the primary caregiver for her husband with advanced Parkinson's disease but continues to say this is not stressful to her. Will continue to monitor.       Expected Outcomes Patient will have no psychosocial issues identified at discharge.       Interventions Encouraged to attend Cardiac Rehabilitation for the exercise;Stress management education;Relaxation education       Continue Psychosocial Services  No Follow up required              Psychosocial Discharge (Final Psychosocial Re-Evaluation):  Psychosocial Re-Evaluation - 09/07/19 1618      Psychosocial Re-Evaluation   Current issues with None Identified    Comments Patient's initial QOL score was 28.48 and her PHQ-9 score was 1 with no psychosocial issues identified. She continues to be the primary caregiver for her husband with advanced Parkinson's disease but continues to say this is not stressful to her. Will continue to monitor.    Expected Outcomes Patient will have no psychosocial issues identified at discharge.    Interventions Encouraged to attend Cardiac Rehabilitation for the exercise;Stress management education;Relaxation education    Continue Psychosocial Services  No Follow up required           Vocational Rehabilitation: Provide vocational rehab assistance to qualifying candidates.   Vocational Rehab Evaluation & Intervention:  Vocational Rehab - 08/29/19 1252      Initial Vocational Rehab Evaluation & Intervention   Assessment shows need for Vocational Rehabilitation No      Vocational Rehab Re-Evaulation   Comments Patient is retired an is not interested in returning to [employer].           Education: Education Goals: Education classes will be  provided on a weekly basis, covering required topics. Participant will state understanding/return demonstration of topics presented.  Learning Barriers/Preferences:  Learning Barriers/Preferences - 08/29/19 1251      Learning Barriers/Preferences   Learning Barriers None    Learning Preferences Written Material;Skilled Demonstration           Education Topics: Hypertension, Hypertension Reduction -Define heart disease and high blood pressure. Discus how high blood pressure affects the body and ways to reduce high blood pressure.   Exercise and Your Heart -Discuss why it is important to exercise, the FITT principles of exercise, normal and abnormal responses to exercise, and how to exercise safely.   Angina -Discuss definition of angina, causes of angina, treatment of angina, and how to decrease risk of having angina.   Cardiac Medications -Review what the following cardiac medications are used for, how they affect the body, and side effects that may occur when taking the medications.  Medications include Aspirin, Beta blockers, calcium channel blockers, ACE Inhibitors, angiotensin receptor blockers, diuretics, digoxin, and antihyperlipidemics.   Congestive Heart Failure -Discuss the definition of CHF, how to live with CHF, the signs and symptoms of CHF, and how keep track of weight and sodium intake.   Heart Disease and Intimacy -Discus the effect sexual activity has on the heart, how changes occur during intimacy as we age, and safety during sexual activity.   Smoking Cessation / COPD -Discuss different methods to quit smoking, the health benefits of quitting smoking, and the definition of COPD.   Nutrition I: Fats -Discuss the types of cholesterol, what cholesterol does to the heart, and how cholesterol levels can be controlled.   Nutrition II:  Labels -Discuss the different components of food labels and how to read food label   Heart Parts/Heart Disease and  PAD -Discuss the anatomy of the heart, the pathway of blood circulation through the heart, and these are affected by heart disease.   Stress I: Signs and Symptoms -Discuss the causes of stress, how stress may lead to anxiety and depression, and ways to limit stress.   Stress II: Relaxation -Discuss different types of relaxation techniques to limit stress.   Warning Signs of Stroke / TIA -Discuss definition of a stroke, what the signs and symptoms are of a stroke, and how to identify when someone is having stroke.   CARDIAC REHAB PHASE II EXERCISE from 09/07/2019 in [place]  Date 09/07/19  Educator [place]  Instruction Review Code 1- Verbalizes Understanding      Knowledge Questionnaire Score:  Knowledge Questionnaire Score - 08/29/19 1251      Knowledge Questionnaire Score   Pre Score 22/24           Core Components/Risk Factors/Patient Goals at Admission:  Personal Goals and Risk Factors at Admission - 08/29/19 1252      Core Components/Risk Factors/Patient Goals on Admission    Weight Management Weight Maintenance    Hypertension Yes    Intervention Provide education on lifestyle modifcations including regular physical activity/exercise, weight management, moderate sodium restriction and increased consumption of fresh fruit, vegetables, and low fat dairy, alcohol moderation, and smoking cessation.;Monitor prescription use compliance.    Expected Outcomes Short Term: Continued assessment and intervention until BP is < 140/42mm HG in hypertensive participants. < 130/66mm HG in hypertensive participants with diabetes, heart failure or chronic kidney disease.;Long Term: Maintenance of blood pressure at goal levels.    Personal Goal Other Yes    Personal Goal Stay healthy; be able to enjoy her grandchildren. Live a healthy life.    Intervention Patient will attend CR 3 days/week and supplement with exercise 2 days/week.    Expected Outcomes Patient will  complete the program meeting both program and personal goals.           Core Components/Risk Factors/Patient Goals Review:   Goals and Risk Factor Review    Row Name 09/07/19 1620             Core Components/Risk Factors/Patient Goals Review   Personal Goals Review Weight Management/Obesity  Stay healthy; be able to enjoy her grandchildren; live a healthy life.       Review Patient is new to the program completing 5 sessions maintaining her weight since her initial visit. She says she does feels stronger and better with only 5 session. Will continue to monitor.       Expected Outcomes Patient will continue to attend sessions and complete the program meeting both program and personal goals.              Core Components/Risk Factors/Patient Goals at Discharge (Final Review):   Goals and Risk Factor Review - 09/07/19 1620      Core Components/Risk Factors/Patient Goals Review   Personal Goals Review Weight Management/Obesity   Stay healthy; be able to enjoy her grandchildren; live a healthy life.   Review Patient is new to the program completing 5 sessions maintaining her weight since her initial visit. She says she does feels stronger and better with only 5 session. Will continue to monitor.    Expected Outcomes Patient will continue to attend sessions and complete the program meeting both program and personal  goals.           ITP Comments:   Comments: ITP REVIEW Pt is making expected progress toward Cardiac Rehab goals after completing 5 sessions. Recommend continued exercise, life style modification, education, and increased stamina and strength.

## 2019-09-16 ENCOUNTER — Encounter (HOSPITAL_COMMUNITY): Payer: Medicare Other

## 2019-09-19 ENCOUNTER — Encounter (HOSPITAL_COMMUNITY): Payer: Medicare Other

## 2019-09-19 ENCOUNTER — Other Ambulatory Visit: Payer: Self-pay

## 2019-09-19 ENCOUNTER — Encounter (HOSPITAL_COMMUNITY)
Admission: RE | Admit: 2019-09-19 | Discharge: 2019-09-19 | Disposition: A | Discharge status: Home / Self Care | Payer: Medicare Other | Source: Ambulatory Visit | Attending: Cardiology | Admitting: Cardiology

## 2019-09-19 VITALS — Wt 175.0 lb

## 2019-09-19 DIAGNOSIS — Z951 Presence of aortocoronary bypass graft: Secondary | ICD-10-CM | POA: Diagnosis not present

## 2019-09-19 NOTE — Progress Notes (Signed)
Daily Session Note  Patient Details  Name: Gabriela Smith MRN: [id number] Date of Birth: 04-May-1940 Referring Provider:     CARDIAC REHAB PHASE II ORIENTATION from 08/29/2019 in [place]  Referring Provider Dr. Percival Spanish      Encounter Date: 09/19/2019  Check In:  Session Check In - 09/19/19 1108      Check-In   Supervising physician immediately available to respond to emergencies See telemetry face sheet for immediately available MD    Location [facility]    Staff Present Ramon Dredge, RN, MHA;Vaniece Hatchett, Exercise Physiologist;Debra Wynetta Emery, RN, BSN    Virtual Visit No    Medication changes reported     No    Fall or balance concerns reported    No    Tobacco Cessation No Change    Warm-up and Cool-down Performed as group-led instruction    Resistance Training Performed Yes    VAD Patient? No    PAD/SET Patient? No      Pain Assessment   Currently in Pain? No/denies    Pain Score 0-No pain    Multiple Pain Sites No           Capillary Blood Glucose: No results found for this or any previous visit (from the past 24 hour(s)).    Social History   Tobacco Use  Smoking Status Former Smoker  . Packs/day: 0.50  . Years: 5.00  . Pack years: 2.50  . Types: Cigarettes  . Quit date: 03/09/1976  . Years since quitting: 43.5  Smokeless Tobacco Never Used    Goals Met:  Independence with exercise equipment Exercise tolerated well No report of cardiac concerns or symptoms Strength training completed today  Goals Unmet:  Not Applicable  Comments: session 11:00 - 12:00   Dr. Kathie Dike is Medical Director for [facility].

## 2019-09-21 ENCOUNTER — Encounter (HOSPITAL_COMMUNITY): Payer: Medicare Other

## 2019-09-22 ENCOUNTER — Ambulatory Visit (INDEPENDENT_AMBULATORY_CARE_PROVIDER_SITE_OTHER): Payer: Medicare Other | Admitting: *Deleted

## 2019-09-22 DIAGNOSIS — I739 Peripheral vascular disease, unspecified: Secondary | ICD-10-CM

## 2019-09-22 DIAGNOSIS — I10 Essential (primary) hypertension: Secondary | ICD-10-CM

## 2019-09-22 NOTE — Chronic Care Management (AMB) (Signed)
Chronic Care Management   Follow Up Note   09/22/2019 Name: Gabriela Smith MRN: [id number] DOB: February 27, 1941  Referred by: [name], DO Reason for referral : Chronic Care Management (RN follow up)   MIKAIAH Smith is a 79 y.o. year old female who is a primary care patient of [name], DO. The CCM team was consulted for assistance with chronic disease management and care coordination needs.    Review of patient status, including review of consultants reports, relevant laboratory and other test results, and collaboration with appropriate care team members and the patient's provider was performed as part of comprehensive patient evaluation and provision of chronic care management services.    I talked with MS Hammad by telephone today regarding management of her chronic medical conditions.   SDOH (Social Determinants of Health) assessments performed: No See Care Plan activities for detailed interventions related to [facility])     Outpatient Encounter Medications as of 09/22/2019  Medication Sig Note  . acetaminophen (TYLENOL) 500 MG tablet Take 2 tablets (1,000 mg total) by mouth every 8 (eight) hours. (Patient taking differently: Take 1,000 mg by mouth at bedtime as needed for headache (pain/sleep). )   . amiodarone (PACERONE) 200 MG tablet Take 1 tablet (200 mg total) by mouth 2 (two) times daily. For 2 days, then decrease to 200 mg daily (Patient taking differently: Take 200 mg by mouth at bedtime. )   . amLODipine (NORVASC) 2.5 MG tablet Take 1 tablet (2.5 mg total) by mouth daily.   [name] apixaban (ELIQUIS) 5 MG TABS tablet Take 1 tablet (5 mg total) by mouth 2 (two) times daily.   [name] aspirin EC 81 MG EC tablet Take 1 tablet (81 mg total) by mouth daily.   [name] b complex vitamins tablet Take 1 tablet by mouth daily after breakfast.  (Patient not taking: Reported on 08/31/2019)   . cholecalciferol (VITAMIN D) 1000 UNITS tablet Take 1,000 Units by mouth daily after breakfast.    .  Co-Enzyme Q10 200 MG CAPS Take 200 mg by mouth daily after breakfast.    . digoxin (LANOXIN) 0.125 MG tablet Take 1 tablet (0.125 mg total) by mouth daily.   . Evolocumab (REPATHA SURECLICK) 735 MG/ML SOAJ Inject 1 Dose into the skin every 14 (fourteen) days.   . Flaxseed, Linseed, (FLAX SEED OIL) 1000 MG CAPS Take 1,000 mg by mouth daily after breakfast.    . gabapentin (NEURONTIN) 100 MG capsule Take 1 capsule (100 mg total) by mouth at bedtime. 04/15/2019: Pt has been taking for a couple weeks - wondering if this caused the current shortness of breath   . Magnesium 300 MG CAPS Take 300 mg by mouth at bedtime.    . metoprolol tartrate (LOPRESSOR) 50 MG tablet TAKE (1) TABLET TWICE A DAY. (Patient taking differently: Take 50 mg by mouth 2 (two) times daily. TAKE (1) TABLET TWICE A DAY.)   . nitroGLYCERIN (NITROSTAT) 0.4 MG SL tablet PLACE 1 TABLET UNDER THE TONGUE AT ONSET OF CHEST PAIN EVERY 5 MINTUES UP TO 3 TIMES AS NEEDED (Patient taking differently: Place 0.4 mg under the tongue every 5 (five) minutes as needed for chest pain. )   . OVER THE COUNTER MEDICATION Take 1 tablet by mouth daily after breakfast. Elderberry/Vitamin C   . traMADol (ULTRAM) 50 MG tablet Take 50 mg by mouth every 4 (four) hours as needed for moderate pain.   . TURMERIC PO Take 1 capsule by mouth daily after breakfast.   .  vitamin B-12 (CYANOCOBALAMIN) 500 MCG tablet Take 1 tablet (500 mcg total) by mouth daily.    Facility-Administered Encounter Medications as of 09/22/2019  Medication  . DOBUTamine (DOBUTREX) 1,000 mcg/mL in dextrose 5% 250 mL infusion    Lab Results  Component Value Date   TSH 8.590 (H) 08/12/2019   BP Readings from Last 3 Encounters:  08/29/19 (!) 170/64  08/12/19 (!) 180/72  07/11/19 140/90    RN Care Plan             This Visit's Progress   .  "I want the fluid in my legs to get better" (pt-stated)   On track     [place] (see longitudinal plan of care for additional care plan  information)  Current Barriers:  . Care Coordination needs related to lower extremity edema in a patient with CHF, HTN, and peripheral vascular insuficiency (disease states)  Nurse Case Manager Clinical Goal(s):  [name] Over the next 30 days, patient will keep all scheduled medical appointment . Over the next 60 days, patient will talk with RN Care Manager regarding lower extremity edema  Interventions:  . Inter-disciplinary care team collaboration (see longitudinal plan of care) . Chart reviewed including recent office notes and lab results . Patient is receiving Cardiac Rehab . Discussed use of compression hose o Patient has discontinued use per Dr Marjean Donna consent because of skin irritation o States that lower extremity edema has not gotten worse since d/c hose . Recommended that she weight and record her weight daily . Advised to call cardiologist with any weight increase of 3 lbs in one day or 5 lbs in one week . Reviewed upcoming appointments: Dr Percival Spanish on 10/12/19  Patient Self Care Activities:  . Performs ADL's independently . Performs IADL's independently  Please see past updates related to this goal by clicking on the "Past Updates" button in the selected goal      .  "I want to keep my blood pressure under control" (pt-stated)        CARE PLAN ENTRY (see longitudinal plan of care for additional care plan information)  Current Barriers:  . Chronic Disease Management support and education needs related to hypertension  Nurse Case Manager Clinical Goal(s):  [name] Over the next 60 days, patient will work with Consulting civil engineer regarding hypertension . Over the next 90 days, patient will keep all scheduled medical appointments . Over the next 90 days, patient will take all medications as prescribed . Over the next 90 days, patient will report any new or worsening symptoms to PCP or cardiologist  Interventions:  . Inter-disciplinary care team collaboration (see longitudinal  plan of care) . Chart reviewed including recent notes: OV, urgent care, telephone, and cardiac rehab . Discussed BP elevation last month and visit with Urgent Care . Reviewed medications and discussed Dr Hochrein's addition of amlodipine 2.5mg  daily for BP control . Discussed home monitoring o Patient is checking BP daily. Systolic was 277 today. Can't recall diastolic.  o Reports "normal" readings at home. States that it's always higher at the doctor's office . Encouraged to continue measuring and recording BP daily and to call PCP or cardiologist with any readings outside of provider recommended range . Provided with RNCM contact information and encouraged to reach out as needed  Patient Self Care Activities:  . Performs ADL's independently . Performs IADL's independently  Initial goal documentation     .  Care Manager Goal: Discuss TSH elevation with PCP  CARE PLAN ENTRY (see longitudinal plan of care for additional care plan information)  Current Barriers:  [name] Knowledge Deficits related to TSH elevation  Nurse Case Manager Clinical Goal(s):  [name] Over the next 14 days, patient will work with Consulting civil engineer regarding elevated TSH  Interventions:  . Inter-disciplinary care team collaboration (see longitudinal plan of care) . Chart reviewed including recent office notes and lab results . Elevated TSH noted on labs drawn in PCP office on 08/12/19 and in the hospital on 04/27/19 . Results form June were forwarded to Dr Percival Spanish for review  . Discussed elevation with patient. She was unaware.  . Discussed elevated TSH o Patient reports having problem with dry skin, brittle nails, and inability to lose weight prior to cardiac procedure in 04/2019. Things have improved since then.  o Questions answered . Outreached to PCP regarding elevation and to discuss monitoring vs treatment. Will await reply. . RN to follow-up with patient over the next 7 days once reply from PCP is received .  Provided with RNCM contact number and encouraged to reach out as needed  Patient Self Care Activities:  . Performs ADL's independently . Performs IADL's independently  Initial goal documentation         Plan:   The care management team will reach out to the patient again over the next 7 days.    Chong Sicilian, BSN, RN-BC Embedded Chronic Care Manager [facility] / [place] Management Direct Dial: [phone]

## 2019-09-22 NOTE — Patient Instructions (Signed)
Visit Information  Goals Addressed              This Visit's Progress     Patient Stated   .  "I want the fluid in my legs to get better" (pt-stated)   On track     [place] (see longitudinal plan of care for additional care plan information)  Current Barriers:  . Care Coordination needs related to lower extremity edema in a patient with CHF, HTN, and peripheral vascular insuficiency (disease states)  Nurse Case Manager Clinical Goal(s):  [name] Over the next 30 days, patient will keep all scheduled medical appointment . Over the next 60 days, patient will talk with RN Care Manager regarding lower extremity edema  Interventions:  . Inter-disciplinary care team collaboration (see longitudinal plan of care) . Chart reviewed including recent office notes and lab results . Patient is receiving Cardiac Rehab . Discussed use of compression hose o Patient has discontinued use per Dr Marjean Donna consent because of skin irritation o States that lower extremity edema has not gotten worse since d/c hose . Recommended that she weight and record her weight daily . Advised to call cardiologist with any weight increase of 3 lbs in one day or 5 lbs in one week . Reviewed upcoming appointments: Dr Percival Spanish on 10/12/19  Patient Self Care Activities:  . Performs ADL's independently . Performs IADL's independently  Please see past updates related to this goal by clicking on the "Past Updates" button in the selected goal      .  "I want to keep my blood pressure under control" (pt-stated)        CARE PLAN ENTRY (see longitudinal plan of care for additional care plan information)  Current Barriers:  . Chronic Disease Management support and education needs related to hypertension  Nurse Case Manager Clinical Goal(s):  [name] Over the next 60 days, patient will work with Consulting civil engineer regarding hypertension . Over the next 90 days, patient will keep all scheduled medical appointments . Over  the next 90 days, patient will take all medications as prescribed . Over the next 90 days, patient will report any new or worsening symptoms to PCP or cardiologist  Interventions:  . Inter-disciplinary care team collaboration (see longitudinal plan of care) . Chart reviewed including recent notes: OV, urgent care, telephone, and cardiac rehab . Discussed BP elevation last month and visit with Urgent Care . Reviewed medications and discussed Dr Hochrein's addition of amlodipine 2.5mg  daily for BP control . Discussed home monitoring o Patient is checking BP daily. Systolic was 440 today. Can't recall diastolic.  o Reports "normal" readings at home. States that it's always higher at the doctor's office . Encouraged to continue measuring and recording BP daily and to call PCP or cardiologist with any readings outside of provider recommended range . Provided with RNCM contact information and encouraged to reach out as needed  Patient Self Care Activities:  . Performs ADL's independently . Performs IADL's independently  Initial goal documentation       Other   .  Care Manager Goal: Discuss TSH elevation with PCP        CARE PLAN ENTRY (see longitudinal plan of care for additional care plan information)  Current Barriers:  [name] Knowledge Deficits related to TSH elevation  Nurse Case Manager Clinical Goal(s):  [name] Over the next 14 days, patient will work with Consulting civil engineer regarding elevated TSH  Interventions:  . Inter-disciplinary care team collaboration (see longitudinal  plan of care) . Chart reviewed including recent office notes and lab results . Elevated TSH noted on labs drawn in PCP office on 08/12/19 and in the hospital on 04/27/19 . Results form June were forwarded to Dr Percival Spanish for review  . Discussed elevation with patient. She was unaware.  . Discussed elevated TSH o Patient reports having problem with dry skin, brittle nails, and inability to lose weight prior to cardiac  procedure in 04/2019. Things have improved since then.  o Questions answered . Outreached to PCP regarding elevation and to discuss monitoring vs treatment. Will await reply. . RN to follow-up with patient over the next 7 days once reply from PCP is received . Provided with RNCM contact number and encouraged to reach out as needed  Patient Self Care Activities:  . Performs ADL's independently . Performs IADL's independently  Initial goal documentation        Patient verbalizes understanding of instructions provided today.   Follow-up Plan The care management team will reach out to the patient again over the next 7 days.   Chong Sicilian, BSN, RN-BC Embedded Chronic Care Manager [facility] / [place] Management Direct Dial: [phone]

## 2019-09-23 ENCOUNTER — Encounter (HOSPITAL_COMMUNITY): Payer: Medicare Other

## 2019-09-26 ENCOUNTER — Encounter (HOSPITAL_COMMUNITY): Payer: Medicare Other

## 2019-09-27 ENCOUNTER — Ambulatory Visit: Payer: Medicare Other | Admitting: *Deleted

## 2019-09-27 ENCOUNTER — Other Ambulatory Visit: Payer: Self-pay | Admitting: Family Medicine

## 2019-09-27 DIAGNOSIS — R7989 Other specified abnormal findings of blood chemistry: Secondary | ICD-10-CM

## 2019-09-27 DIAGNOSIS — I10 Essential (primary) hypertension: Secondary | ICD-10-CM

## 2019-09-27 DIAGNOSIS — I5031 Acute diastolic (congestive) heart failure: Secondary | ICD-10-CM

## 2019-09-27 DIAGNOSIS — I480 Paroxysmal atrial fibrillation: Secondary | ICD-10-CM

## 2019-09-27 DIAGNOSIS — E785 Hyperlipidemia, unspecified: Secondary | ICD-10-CM | POA: Diagnosis not present

## 2019-09-27 MED ORDER — LEVOTHYROXINE SODIUM 50 MCG PO TABS: 50.0000 ug | ORAL_TABLET | Freq: Every day | ORAL | 0 refills | Status: DC

## 2019-09-27 NOTE — Chronic Care Management (AMB) (Signed)
Chronic Care Management   Follow Up Note   09/27/2019 Name: Gabriela Smith MRN: [id number] DOB: April 08, 1940  Referred by: [name], DO Reason for referral : Chronic Care Management (RN follow up)   Gabriela Smith is a 79 y.o. year old female who is a primary care patient of [name], DO. The CCM team was consulted for assistance with chronic disease management and care coordination needs.    Review of patient status, including review of consultants reports, relevant laboratory and other test results, and collaboration with appropriate care team members and the patient's provider was performed as part of comprehensive patient evaluation and provision of chronic care management services.      Outpatient Encounter Medications as of 09/27/2019  Medication Sig Note  . acetaminophen (TYLENOL) 500 MG tablet Take 2 tablets (1,000 mg total) by mouth every 8 (eight) hours. (Patient taking differently: Take 1,000 mg by mouth at bedtime as needed for headache (pain/sleep). )   . amiodarone (PACERONE) 200 MG tablet Take 1 tablet (200 mg total) by mouth 2 (two) times daily. For 2 days, then decrease to 200 mg daily (Patient taking differently: Take 200 mg by mouth at bedtime. )   . amLODipine (NORVASC) 2.5 MG tablet Take 1 tablet (2.5 mg total) by mouth daily.   [name] apixaban (ELIQUIS) 5 MG TABS tablet Take 1 tablet (5 mg total) by mouth 2 (two) times daily.   [name] aspirin EC 81 MG EC tablet Take 1 tablet (81 mg total) by mouth daily.   [name] b complex vitamins tablet Take 1 tablet by mouth daily after breakfast.  (Patient not taking: Reported on 08/31/2019)   . cholecalciferol (VITAMIN D) 1000 UNITS tablet Take 1,000 Units by mouth daily after breakfast.    . Co-Enzyme Q10 200 MG CAPS Take 200 mg by mouth daily after breakfast.    . digoxin (LANOXIN) 0.125 MG tablet Take 1 tablet (0.125 mg total) by mouth daily.   . Evolocumab (REPATHA SURECLICK) 449 MG/ML SOAJ Inject 1 Dose into the skin  every 14 (fourteen) days.   . Flaxseed, Linseed, (FLAX SEED OIL) 1000 MG CAPS Take 1,000 mg by mouth daily after breakfast.    . gabapentin (NEURONTIN) 100 MG capsule Take 1 capsule (100 mg total) by mouth at bedtime. 04/15/2019: Pt has been taking for a couple weeks - wondering if this caused the current shortness of breath   . levothyroxine (SYNTHROID) 50 MCG tablet Take 1 tablet (50 mcg total) by mouth daily.   . Magnesium 300 MG CAPS Take 300 mg by mouth at bedtime.    . metoprolol tartrate (LOPRESSOR) 50 MG tablet TAKE (1) TABLET TWICE A DAY. (Patient taking differently: Take 50 mg by mouth 2 (two) times daily. TAKE (1) TABLET TWICE A DAY.)   . nitroGLYCERIN (NITROSTAT) 0.4 MG SL tablet PLACE 1 TABLET UNDER THE TONGUE AT ONSET OF CHEST PAIN EVERY 5 MINTUES UP TO 3 TIMES AS NEEDED (Patient taking differently: Place 0.4 mg under the tongue every 5 (five) minutes as needed for chest pain. )   . OVER THE COUNTER MEDICATION Take 1 tablet by mouth daily after breakfast. Elderberry/Vitamin C   . traMADol (ULTRAM) 50 MG tablet Take 50 mg by mouth every 4 (four) hours as needed for moderate pain.   . TURMERIC PO Take 1 capsule by mouth daily after breakfast.   . vitamin B-12 (CYANOCOBALAMIN) 500 MCG tablet Take 1 tablet (500 mcg total) by mouth daily.  Facility-Administered Encounter Medications as of 09/27/2019  Medication  . DOBUTamine (DOBUTREX) 1,000 mcg/mL in dextrose 5% 250 mL infusion     Lab Results  Component Value Date   TSH 8.590 (H) 08/12/2019     RN Care Plan            This Visit's Progress   . "I want to manage my thyroid"       [place] (see longitudinal plan of care for additional care plan information)  Current Barriers:  [name] Knowledge Deficits related to TSH elevation . Care coordination needs related to elevated TSH needed in a patient with Afib, CHF, and HTN.  Nurse Case Manager Clinical Goal(s):  [name] Over the next 90 days, patient will take levothyroxine as  prescribed . Over the next 60 days, patient will have a follow-up visit with PCP and repeat TSH . Over the next 90 days, patient will reach out to PCP with any new or worsening symptoms  Interventions:  . Inter-disciplinary care team collaboration (see longitudinal plan of care) . Chart reviewed including recent office notes and lab results . Previously discussed elevated TSH with patient . Collaborated with Dr Lajuana Ripple and Dr Percival Spanish regarding elevation o Dr Lajuana Ripple recommended levothyroxine if Dr Percival Spanish did not want to adjust amiodarone o Dr Percival Spanish did not want to adjust amiodarone and agreed with levothyroxine o Dr Lajuana Ripple sent levothyroxine 48mcg to [facility] . Advised patient that script was sent to [place] . Advised patient to take each morning, 30 min prior to meals and other meds . Advised that she will need a f/u appt with Dr Lajuana Ripple and TSH level drawn in 8 weeks . Answered patient's questions . Collaborated with [facility] front office staff to schedule patient a follow-up appt on 12/09/19  Patient Self Care Activities:  . Performs ADL's independently . Performs IADL's independently  Please see past updates related to this goal by clicking on the "Past Updates" button in the selected goal          Plan:  The care management team will reach out to the patient again over the next 60 days.   Follow-up with PCP on 12/09/19   Chong Sicilian, BSN, RN-BC [place] / [place] Management Direct Dial: [phone]

## 2019-09-27 NOTE — Patient Instructions (Signed)
Visit Information  Goals Addressed            This Visit's Progress   . "I want to manage my thyroid"       [place] (see longitudinal plan of care for additional care plan information)  Current Barriers:  [name] Knowledge Deficits related to TSH elevation . Care coordination needs related to elevated TSH needed in a patient with Afib, CHF, and HTN.  Nurse Case Manager Clinical Goal(s):  [name] Over the next 90 days, patient will take levothyroxine as prescribed . Over the next 60 days, patient will have a follow-up visit with PCP and repeat TSH . Over the next 90 days, patient will reach out to PCP with any new or worsening symptoms  Interventions:  . Inter-disciplinary care team collaboration (see longitudinal plan of care) . Chart reviewed including recent office notes and lab results . Previously discussed elevated TSH with patient . Collaborated with Dr Lajuana Ripple and Dr Percival Spanish regarding elevation o Dr Lajuana Ripple recommended levothyroxine if Dr Percival Spanish did not want to adjust amiodarone o Dr Percival Spanish did not want to adjust amiodarone and agreed with levothyroxine o Dr Lajuana Ripple sent levothyroxine 43mcg to [facility] . Advised patient that script was sent to [place] . Advised patient to take each morning, 30 min prior to meals and other meds . Advised that she will need a f/u appt with Dr Lajuana Ripple and TSH level drawn in 8 weeks . Answered patient's questions . Collaborated with [facility] front office staff to schedule patient a follow-up appt on 12/09/19  Patient Self Care Activities:  . Performs ADL's independently . Performs IADL's independently  Please see past updates related to this goal by clicking on the "Past Updates" button in the selected goal         Patient verbalizes understanding of instructions provided today.   Follow-up Plan The care management team will reach out to the patient again over the next 60 days.   PCP f/u 12/09/19  Chong Sicilian, BSN, RN-BC Embedded [place] / [place] Management Direct Dial: [phone]

## 2019-09-28 ENCOUNTER — Encounter (HOSPITAL_COMMUNITY): Payer: Medicare Other

## 2019-09-29 ENCOUNTER — Telehealth (INDEPENDENT_AMBULATORY_CARE_PROVIDER_SITE_OTHER): Payer: Medicare Other | Admitting: Internal Medicine

## 2019-09-29 ENCOUNTER — Encounter: Payer: Self-pay | Admitting: Internal Medicine

## 2019-09-29 VITALS — BP 146/63 | HR 60 | Wt 169.0 lb

## 2019-09-29 DIAGNOSIS — E785 Hyperlipidemia, unspecified: Secondary | ICD-10-CM | POA: Diagnosis not present

## 2019-09-29 DIAGNOSIS — M791 Myalgia, unspecified site: Secondary | ICD-10-CM | POA: Diagnosis not present

## 2019-09-29 DIAGNOSIS — T466X5A Adverse effect of antihyperlipidemic and antiarteriosclerotic drugs, initial encounter: Secondary | ICD-10-CM

## 2019-09-29 DIAGNOSIS — Z951 Presence of aortocoronary bypass graft: Secondary | ICD-10-CM

## 2019-09-29 NOTE — Patient Instructions (Signed)
Medication Instructions:  Your physician recommends that you continue on your current medications as directed. Please refer to the Current Medication list given to you today.  *If you need a refill on your cardiac medications before your next appointment, please call your pharmacy*   Lab Work: FASTING lipid panel in 6 months to check cholesterol   If you have labs (blood work) drawn today and your tests are completely normal, you will receive your results only by: [name] MyChart Message (if you have MyChart) OR . A paper copy in the mail If you have any lab test that is abnormal or we need to change your treatment, we will call you to review the results.   Testing/Procedures: NONE   Follow-Up: At [facility], you and your health needs are our priority.  As part of our continuing mission to provide you with exceptional heart care, we have created designated Provider Care Teams.  These Care Teams include your primary Cardiologist (physician) and Advanced Practice Providers (APPs -  Physician Assistants and Nurse Practitioners) who all work together to provide you with the care you need, when you need it.  We recommend signing up for the patient portal called "MyChart".  Sign up information is provided on this After Visit Summary.  MyChart is used to connect with patients for Virtual Visits (Telemedicine).  Patients are able to view lab/test results, encounter notes, upcoming appointments, etc.  Non-urgent messages can be sent to your provider as well.   To learn more about what you can do with MyChart, go to [email or web address].    Your next appointment:   6 month(s)  The format for your next appointment:   In Person or Virtual  Provider:   Raliegh Ip Mali Hilty, MD   Other Instructions

## 2019-09-29 NOTE — Progress Notes (Signed)
Virtual Visit via Telephone Note   This visit type was conducted due to national recommendations for restrictions regarding the COVID-19 Pandemic (e.g. social distancing) in an effort to limit this patient's exposure and mitigate transmission in our community.  Due to her co-morbid illnesses, this patient is at least at moderate risk for complications without adequate follow up.  This format is felt to be most appropriate for this patient at this time.  The patient did not have access to video technology/had technical difficulties with video requiring transitioning to audio format only (telephone).  All issues noted in this document were discussed and addressed.  No physical exam could be performed with this format.  Please refer to the patient's chart for her  consent to telehealth for [facility].   Evaluation Performed:  Telephone consult  Date:  09/29/2019   ID:  Gabriela Smith, DOB Oct 28, 1940, MRN [id number]  Patient Location:  [address] [place] [address]  Provider location:   [address] [place], [place] [address]  PCP:  [name], DO  Cardiologist:  Minus Breeding, MD Electrophysiologist:  None   Chief Complaint:  Manage dyslipidemia, statin intolerant  History of Present Illness:    Gabriela Smith is a 79 y.o. female who presents via audio/video conferencing for a telehealth visit today. This is a pleasant 79 year old female patient of Dr. Percival Spanish who unfortunately underwent coronary artery bypass grafting in February of this year. She has a strong family history of coronary artery disease, including a son and a daughter who both had early onset heart disease in their 31s and both parents who had heart disease. She has had difficult to control dyslipidemia, and unfortunately has been multistatin intolerant and intolerant to ezetimibe due to significant myalgias. Post surgery she was referred to the lipid clinic for evaluation and management  options. Overall she says she is feeling better and seems to be improving since her surgery however continues to have problems with her feet causing pain, weakness and heaviness.  09/29/2019  Gabriela Smith is seen today in follow-up.  She has had a good response to Repatha.  LDL cholesterol has decreased from 185 down to 62.  Total cholesterol is 144, HDL 46 and there was a small increase in triglycerides from  131-224.  Previously her triglycerides however were 231 I suspect the 131 value 5 months ago may have been spurious.  The patient does not have symptoms concerning for COVID-19 infection (fever, chills, cough, or new SHORTNESS OF BREATH).    Prior CV studies:   The following studies were reviewed today:  Chart reviewed, labs  PMHx:  Past Medical History:  Diagnosis Date  . AAA (abdominal aortic aneurysm) [facility])    cardiology aware   . Arthritis    OA lt knee- cortizone inj. q 4 months AND OA ALSO IN BACK. Had knee replaced-issue resolved  . CAD (coronary artery disease) CARDIOLOGIST- DR [facility]--  VISIT 01-02-10 IN EPIC   1993-- PTCA. Pt describes a near total blockage of apparently the LAD and a 65% stenosis elsewhere.  . Cancer ([place])   . History of bladder cancer followed by dr Karsten Ro   hx  TCC of bladder ,  Ta G3-  first occurence 02-20-2012--  s/p BCG tx's  and  mitomycin C  . Hyperlipidemia 1993  . Hypertension 1993  . Nocturia   . Osteopenia   . Sigmoid diverticulosis     Past Surgical History:  Procedure Laterality Date  .  CHOLECYSTECTOMY  2003 (approx)  . CLIPPING OF ATRIAL APPENDAGE N/A 04/25/2019   Procedure: Clipping Of Atrial Appendage;  Surgeon: Lajuana Matte, MD;  Location: [place];  Service: Open Heart Surgery;  Laterality: N/A;  . COLONOSCOPY  05-31-2003  . CORONARY ANGIOPLASTY  1993   to LAD  . CORONARY ARTERY BYPASS GRAFT N/A 04/25/2019   Procedure: CORONARY ARTERY BYPASS GRAFTING (CABG) times four using left internal mammary artery and left  greater saphenous vein harvested endoscipically.;  Surgeon: Lajuana Matte, MD;  Location: [place];  Service: Open Heart Surgery;  Laterality: N/A;  . CYSTOSCOPY WITH BIOPSY  05/09/2011   Procedure: CYSTOSCOPY WITH BIOPSY;  Surgeon: Claybon Jabs, MD;  Location: [facility];  Service: Urology;  Laterality: N/A;  WITH BLADDER BIOPSY GYRUS  . LEFT HEART CATH AND CORONARY ANGIOGRAPHY N/A 04/19/2019   Procedure: LEFT HEART CATH AND CORONARY ANGIOGRAPHY;  Surgeon: Leonie Man, MD;  Location: [place] [facility];  Service: Cardiovascular;  Laterality: N/A;  . TOTAL HIP ARTHROPLASTY Right 12/25/2015   Procedure: RIGHT TOTAL HIP ARTHROPLASTY ANTERIOR APPROACH;  Surgeon: Paralee Cancel, MD;  Location: [facility];  Service: Orthopedics;  Laterality: Right;  . TOTAL KNEE ARTHROPLASTY Left 11/22/2012   Procedure: LEFT TOTAL KNEE ARTHROPLASTY;  Surgeon: Mauri Pole, MD;  Location: [facility];  Service: Orthopedics;  Laterality: Left;  . TRANSURETHRAL RESECTION OF BLADDER TUMOR  03/14/2011   Procedure: TRANSURETHRAL RESECTION OF BLADDER TUMOR (TURBT);  Surgeon: Claybon Jabs, MD;  Location: [facility];  Service: Urology;  Laterality: N/A;  . TRANSURETHRAL RESECTION OF BLADDER TUMOR  05/09/2011   Procedure: TRANSURETHRAL RESECTION OF BLADDER TUMOR (TURBT);  Surgeon: Claybon Jabs, MD;  Location: [facility];  Service: Urology;  Laterality: N/A;  . TRANSURETHRAL RESECTION OF BLADDER TUMOR  03/08/2012   Procedure: TRANSURETHRAL RESECTION OF BLADDER TUMOR (TURBT);  Surgeon: Claybon Jabs, MD;  Location: [facility];  Service: Urology;  Laterality: N/A;  . TRANSURETHRAL RESECTION OF BLADDER TUMOR WITH GYRUS (TURBT-GYRUS) N/A 03/24/2014   Procedure: TRANSURETHRAL RESECTION OF BLADDER TUMOR WITH GYRUS (TURBT-GYRUS);  Surgeon: Claybon Jabs, MD;  Location[facility];  Service: Urology;  Laterality: N/A;  . UMBILICAL HERNIA REPAIR  2001  (approx)     FAMHx:  Family History  Problem Relation Age of Onset  . Coronary artery disease Father 102  . Hyperlipidemia Father   . Heart attack Mother 53  . Hyperlipidemia Mother   . Osteoporosis Sister   . Hyperlipidemia Sister   . Heart attack Daughter 43       CABG x 5  . Heart disease Son 46       Stents x 3    SOCHx:   reports that she quit smoking about 43 years ago. Her smoking use included cigarettes. She has a 2.50 pack-year smoking history. She has never used smokeless tobacco. She reports current alcohol use of about 5.0 standard drinks of alcohol per week. She reports that she does not use drugs.  ALLERGIES:  Allergies  Allergen Reactions  . Fish Allergy Hives, Shortness Of Breath and Other (See Comments)    "Bottom-feeder fish"  . Fish-Derived Products Hives, Shortness Of Breath and Other (See Comments)    Bottom-feeder fish  . Shellfish-Derived Products Hives and Shortness Of Breath  . Lasix [Furosemide] Swelling and Other (See Comments)    Edema, elevated BP  . Macrodantin [Nitrofurantoin Macrocrystal] Other (See Comments)  Unknown reaction   . Vicodin [Hydrocodone-Acetaminophen] Nausea And Vomiting and Other (See Comments)    SEVERE N/V  . Zetia [Ezetimibe] Other (See Comments)    Muscle aches  . Codeine Rash  . Latex Itching and Rash    Reports Elastic in underwear, under wire bras, and now surgical cap cause rash and itching.   . Niacin Other (See Comments)    Muscle aches  . Niaspan [Niacin Er] Other (See Comments)    Muscle aches  . Statins Other (See Comments)    Muscle aches/ cramps  . Sulfa Antibiotics Rash and Other (See Comments)    Muscle aches/ cramps, also    MEDS:  Current Meds  Medication Sig  . acetaminophen (TYLENOL) 500 MG tablet Take 2 tablets (1,000 mg total) by mouth every 8 (eight) hours. (Patient taking differently: Take 1,000 mg by mouth at bedtime as needed for headache (pain/sleep). )  . amiodarone (PACERONE) 200 MG tablet Take  1 tablet (200 mg total) by mouth 2 (two) times daily. For 2 days, then decrease to 200 mg daily (Patient taking differently: Take 200 mg by mouth at bedtime. )  . amLODipine (NORVASC) 2.5 MG tablet Take 1 tablet (2.5 mg total) by mouth daily.  [name] apixaban (ELIQUIS) 5 MG TABS tablet Take 1 tablet (5 mg total) by mouth 2 (two) times daily.  [name] aspirin EC 81 MG EC tablet Take 1 tablet (81 mg total) by mouth daily.  . cholecalciferol (VITAMIN D) 1000 UNITS tablet Take 1,000 Units by mouth daily after breakfast.   . Co-Enzyme Q10 200 MG CAPS Take 200 mg by mouth daily after breakfast.   . digoxin (LANOXIN) 0.125 MG tablet Take 1 tablet (0.125 mg total) by mouth daily.  . Evolocumab (REPATHA SURECLICK) 161 MG/ML SOAJ Inject 1 Dose into the skin every 14 (fourteen) days.  . Flaxseed, Linseed, (FLAX SEED OIL) 1000 MG CAPS Take 1,000 mg by mouth daily after breakfast.   . gabapentin (NEURONTIN) 100 MG capsule Take 1 capsule (100 mg total) by mouth at bedtime.  [name] levothyroxine (SYNTHROID) 50 MCG tablet Take 1 tablet (50 mcg total) by mouth daily.  . Magnesium 300 MG CAPS Take 300 mg by mouth at bedtime.   . metoprolol tartrate (LOPRESSOR) 50 MG tablet TAKE (1) TABLET TWICE A DAY. (Patient taking differently: Take 50 mg by mouth 2 (two) times daily. TAKE (1) TABLET TWICE A DAY.)  . nitroGLYCERIN (NITROSTAT) 0.4 MG SL tablet PLACE 1 TABLET UNDER THE TONGUE AT ONSET OF CHEST PAIN EVERY 5 MINTUES UP TO 3 TIMES AS NEEDED (Patient taking differently: Place 0.4 mg under the tongue every 5 (five) minutes as needed for chest pain. )  . traMADol (ULTRAM) 50 MG tablet Take 50 mg by mouth every 4 (four) hours as needed for moderate pain.  . TURMERIC PO Take 1 capsule by mouth daily after breakfast.  . vitamin B-12 (CYANOCOBALAMIN) 500 MCG tablet Take 1 tablet (500 mcg total) by mouth daily.  . [DISCONTINUED] b complex vitamins tablet Take 1 tablet by mouth daily after breakfast.      ROS: Pertinent items noted in HPI  and remainder of comprehensive ROS otherwise negative.  Labs/Other Tests and Data Reviewed:    Recent Labs: 04/27/2019: Magnesium 2.2 05/09/2019: BNP 393.1 08/12/2019: ALT 25; BUN 13; Creatinine, Ser 0.81; Hemoglobin 11.8; Platelets 248; Potassium 4.7; Sodium 139; TSH 8.590   Recent Lipid Panel Lab Results  Component Value Date/Time   CHOL 144 08/12/2019 10:43 AM  TRIG 224 (H) 08/12/2019 10:43 AM   TRIG 414 (H) 07/31/2016 09:42 AM   HDL 46 08/12/2019 10:43 AM   HDL 49 07/31/2016 09:42 AM   CHOLHDL 3.1 08/12/2019 10:43 AM   CHOLHDL 3.8 04/16/2019 05:08 AM   LDLCALC 62 08/12/2019 10:43 AM   LDLCALC 205 (H) 11/28/2013 12:15 PM   LDLDIRECT 193 (H) 10/21/2016 09:02 AM    Wt Readings from Last 3 Encounters:  09/29/19 169 lb (76.7 kg)  09/19/19 175 lb 0.7 oz (79.4 kg)  09/05/19 176 lb 5.9 oz (80 kg)     Exam:    Vital Signs:  BP (!) 146/63   Pulse 60   Wt 169 lb (76.7 kg)   BMI 29.94 kg/m    Exam not performed due to telephone visit  ASSESSMENT & PLAN:    1. CAD status post CABG x4 (LIMA to LAD, SVG to diagonal, SVG to OM and SVG to PDA) 2. Statin and ezetimibe intolerant-myalgias 3. Significant family history of early onset heart disease 4. AAA  5. Atrial fibrillation 6. Hypertension  Gabriela Smith has had a good response to Repatha.  Her triglycerides remain elevated.  They have been for some time.  I am not sure that the 131 number was accurate 5 months ago.  Either way I would recommend a repeat lipid in 6 months.  If triglycerides remain elevated, we can consider additional therapy such as Vascepa.  COVID-19 Education: The signs and symptoms of COVID-19 were discussed with the patient and how to seek care for testing (follow up with PCP or arrange E-visit).  The importance of social distancing was discussed today.  Patient Risk:   After full review of this patients clinical status, I feel that they are at least moderate risk at this time.  Time:   Today, I have  spent 15 minutes with the patient with telehealth technology discussing dyslipidemia, statin intolerance, CAD and recent CABG, PCSK9 inhibitors.     Medication Adjustments/Labs and Tests Ordered: Current medicines are reviewed at length with the patient today.  Concerns regarding medicines are outlined above.   Tests Ordered: No orders of the defined types were placed in this encounter.   Medication Changes: No orders of the defined types were placed in this encounter.   Disposition:  in 6 month(s)  Pixie Casino, MD, [facility] [place] Director of the Advanced Lipid Disorders &  Cardiovascular Risk Reduction Clinic Diplomate of the American Board of Clinical Lipidology Attending Cardiologist  Direct Dial: [phone]  Fax: [phone]  Website:  [email or web address]  Pixie Casino, MD  09/29/2019 8:57 AM

## 2019-09-30 ENCOUNTER — Encounter (HOSPITAL_COMMUNITY)
Admission: RE | Admit: 2019-09-30 | Discharge: 2019-09-30 | Disposition: A | Discharge status: Home / Self Care | Payer: Medicare Other | Source: Ambulatory Visit | Attending: Cardiology | Admitting: Cardiology

## 2019-09-30 ENCOUNTER — Other Ambulatory Visit: Payer: Self-pay

## 2019-09-30 DIAGNOSIS — Z951 Presence of aortocoronary bypass graft: Secondary | ICD-10-CM | POA: Diagnosis not present

## 2019-09-30 NOTE — Progress Notes (Signed)
Daily Session Note  Patient Details  Name: Gabriela Smith MRN: [id number] Date of Birth: 09-Jun-1940 Referring Provider:     CARDIAC REHAB PHASE II ORIENTATION from 08/29/2019 in [place]  Referring Provider Dr. Percival Spanish      Encounter Date: 09/30/2019  Check In:  Session Check In - 09/30/19 1116      Check-In   Supervising physician immediately available to respond to emergencies See telemetry face sheet for immediately available MD    Location [facility]    Staff Present Algis Downs, Exercise Physiologist;Kien Mirsky Kris Mouton, MS, ACSM-CEP, Exercise Physiologist;Carlette Wilber Oliphant, RN, BSN    Virtual Visit No    Medication changes reported     No    Fall or balance concerns reported    No    Tobacco Cessation No Change    Warm-up and Cool-down Performed as group-led instruction    Resistance Training Performed Yes    VAD Patient? No    PAD/SET Patient? No      Pain Assessment   Currently in Pain? No/denies    Pain Score 0-No pain    Multiple Pain Sites No           Capillary Blood Glucose: No results found for this or any previous visit (from the past 24 hour(s)).    Social History   Tobacco Use  Smoking Status Former Smoker  . Packs/day: 0.50  . Years: 5.00  . Pack years: 2.50  . Types: Cigarettes  . Quit date: 03/09/1976  . Years since quitting: 43.5  Smokeless Tobacco Never Used    Goals Met:  Independence with exercise equipment Exercise tolerated well No report of cardiac concerns or symptoms Strength training completed today  Goals Unmet:  Not Applicable  Comments: checkout time is 1200   Dr. Kathie Dike is Medical Director for [facility] Pulmonary Rehab.

## 2019-10-03 ENCOUNTER — Encounter (HOSPITAL_COMMUNITY)
Admission: RE | Admit: 2019-10-03 | Discharge: 2019-10-03 | Disposition: A | Discharge status: Home / Self Care | Payer: Medicare Other | Source: Ambulatory Visit | Attending: Cardiology | Admitting: Cardiology

## 2019-10-03 ENCOUNTER — Other Ambulatory Visit: Payer: Self-pay

## 2019-10-03 VITALS — Wt 175.5 lb

## 2019-10-03 DIAGNOSIS — Z951 Presence of aortocoronary bypass graft: Secondary | ICD-10-CM | POA: Diagnosis not present

## 2019-10-03 NOTE — Progress Notes (Signed)
Daily Session Note  Patient Details  Name: Gabriela Smith MRN: [id number] Date of Birth: 08/03/1940 Referring Provider:     CARDIAC REHAB PHASE II ORIENTATION from 08/29/2019 in [name]  Referring Provider Dr. Percival Spanish      Encounter Date: 10/03/2019  Check In:  Session Check In - 10/03/19 1110      Check-In   Supervising physician immediately available to respond to emergencies See telemetry face sheet for immediately available MD    Location [facility]    Staff Present Algis Downs, Exercise Physiologist;Shalandra Leu Kris Mouton, MS, ACSM-CEP, Exercise Physiologist;Carlette Wilber Oliphant, RN, BSN    Virtual Visit No    Medication changes reported     No    Fall or balance concerns reported    No    Tobacco Cessation No Change    Warm-up and Cool-down Performed as group-led instruction    Resistance Training Performed Yes    VAD Patient? No    PAD/SET Patient? No      Pain Assessment   Currently in Pain? No/denies    Pain Score 0-No pain    Multiple Pain Sites No           Capillary Blood Glucose: No results found for this or any previous visit (from the past 24 hour(s)).    Social History   Tobacco Use  Smoking Status Former Smoker  . Packs/day: 0.50  . Years: 5.00  . Pack years: 2.50  . Types: Cigarettes  . Quit date: 03/09/1976  . Years since quitting: 43.5  Smokeless Tobacco Never Used    Goals Met:  Independence with exercise equipment Exercise tolerated well No report of cardiac concerns or symptoms Strength training completed today  Goals Unmet:  Not Applicable  Comments: checkout time is 1200   Dr. Kathie Dike is Medical Director for [facility] Pulmonary Rehab.

## 2019-10-05 ENCOUNTER — Encounter (HOSPITAL_COMMUNITY)
Admission: RE | Admit: 2019-10-05 | Discharge: 2019-10-05 | Disposition: A | Discharge status: Home / Self Care | Payer: Medicare Other | Source: Ambulatory Visit | Attending: Cardiology | Admitting: Cardiology

## 2019-10-05 ENCOUNTER — Telehealth: Payer: Medicare Other | Admitting: Internal Medicine

## 2019-10-05 ENCOUNTER — Other Ambulatory Visit: Payer: Self-pay

## 2019-10-05 DIAGNOSIS — Z951 Presence of aortocoronary bypass graft: Secondary | ICD-10-CM

## 2019-10-05 NOTE — Progress Notes (Signed)
Daily Session Note  Patient Details  Name: CATLIN DORIA MRN: [id number] Date of Birth: 1941/02/23 Referring Provider:     CARDIAC REHAB PHASE II ORIENTATION from 08/29/2019 in [place]  Referring Provider Dr. Percival Spanish      Encounter Date: 10/05/2019  Check In:  Session Check In - 10/05/19 1114      Check-In   Supervising physician immediately available to respond to emergencies See telemetry face sheet for immediately available MD    Location [facility]    Staff Present Algis Downs, Exercise Physiologist;Janan Bogie Kris Mouton, MS, ACSM-CEP, Exercise Physiologist;Carlette Wilber Oliphant, RN, BSN    Virtual Visit No    Medication changes reported     No    Fall or balance concerns reported    No    Tobacco Cessation No Change    Warm-up and Cool-down Performed as group-led instruction    Resistance Training Performed Yes    VAD Patient? No    PAD/SET Patient? No      Pain Assessment   Currently in Pain? No/denies    Pain Score 0-No pain    Multiple Pain Sites No           Capillary Blood Glucose: No results found for this or any previous visit (from the past 24 hour(s)).    Social History   Tobacco Use  Smoking Status Former Smoker  . Packs/day: 0.50  . Years: 5.00  . Pack years: 2.50  . Types: Cigarettes  . Quit date: 03/09/1976  . Years since quitting: 43.6  Smokeless Tobacco Never Used    Goals Met:  Independence with exercise equipment Exercise tolerated well No report of cardiac concerns or symptoms Strength training completed today  Goals Unmet:  Not Applicable  Comments: checkout time is 1200   Dr. Kathie Dike is Medical Director for [facility] Pulmonary Rehab.

## 2019-10-06 ENCOUNTER — Ambulatory Visit: Payer: Medicare Other | Admitting: Licensed Clinical Social Worker

## 2019-10-06 DIAGNOSIS — I5031 Acute diastolic (congestive) heart failure: Secondary | ICD-10-CM

## 2019-10-06 DIAGNOSIS — E785 Hyperlipidemia, unspecified: Secondary | ICD-10-CM

## 2019-10-06 DIAGNOSIS — E559 Vitamin D deficiency, unspecified: Secondary | ICD-10-CM

## 2019-10-06 DIAGNOSIS — I714 Abdominal aortic aneurysm, without rupture, unspecified: Secondary | ICD-10-CM

## 2019-10-06 DIAGNOSIS — I10 Essential (primary) hypertension: Secondary | ICD-10-CM | POA: Diagnosis not present

## 2019-10-06 DIAGNOSIS — Z951 Presence of aortocoronary bypass graft: Secondary | ICD-10-CM

## 2019-10-06 DIAGNOSIS — M5136 Other intervertebral disc degeneration, lumbar region: Secondary | ICD-10-CM

## 2019-10-06 DIAGNOSIS — I480 Paroxysmal atrial fibrillation: Secondary | ICD-10-CM

## 2019-10-06 NOTE — Chronic Care Management (AMB) (Signed)
Chronic Care Management    Clinical Social Work Follow Up Note  10/06/2019 Name: Gabriela Smith MRN: [id number] DOB: Jul 26, 1940  Gabriela Smith is a 79 y.o. year old female who is a primary care patient of [name], DO. The CCM team was consulted for assistance with [employer] .   Review of patient status, including review of consultants reports, other relevant assessments, and collaboration with appropriate care team members and the patient's provider was performed as part of comprehensive patient evaluation and provision of chronic care management services.    SDOH (Social Determinants of Health) assessments performed: No;risk for tobacco use; risk for depression; risk for stress;     CARDIAC REHAB PHASE II ORIENTATION from 08/29/2019 in [place]  PHQ-9 Total Score 1       GAD 7 : Generalized Anxiety Score 07/06/2019  Nervous, Anxious, on Edge 0  Control/stop worrying 0  Worry too much - different things 0  Trouble relaxing 0  Restless 1  Easily annoyed or irritable 0  Afraid - awful might happen 0  Total GAD 7 Score 1  Anxiety Difficulty Not difficult at all    Outpatient Encounter Medications as of 10/06/2019  Medication Sig Note  . acetaminophen (TYLENOL) 500 MG tablet Take 2 tablets (1,000 mg total) by mouth every 8 (eight) hours. (Patient taking differently: Take 1,000 mg by mouth at bedtime as needed for headache (pain/sleep). )   . amiodarone (PACERONE) 200 MG tablet Take 1 tablet (200 mg total) by mouth 2 (two) times daily. For 2 days, then decrease to 200 mg daily (Patient taking differently: Take 200 mg by mouth at bedtime. )   . amLODipine (NORVASC) 2.5 MG tablet Take 1 tablet (2.5 mg total) by mouth daily.   [name] apixaban (ELIQUIS) 5 MG TABS tablet Take 1 tablet (5 mg total) by mouth 2 (two) times daily.   [name] aspirin EC 81 MG EC tablet Take 1 tablet (81 mg total) by mouth daily.   . cholecalciferol (VITAMIN D) 1000 UNITS tablet  Take 1,000 Units by mouth daily after breakfast.    . Co-Enzyme Q10 200 MG CAPS Take 200 mg by mouth daily after breakfast.    . digoxin (LANOXIN) 0.125 MG tablet Take 1 tablet (0.125 mg total) by mouth daily.   . Evolocumab (REPATHA SURECLICK) 102 MG/ML SOAJ Inject 1 Dose into the skin every 14 (fourteen) days.   . Flaxseed, Linseed, (FLAX SEED OIL) 1000 MG CAPS Take 1,000 mg by mouth daily after breakfast.    . gabapentin (NEURONTIN) 100 MG capsule Take 1 capsule (100 mg total) by mouth at bedtime. 04/15/2019: Pt has been taking for a couple weeks - wondering if this caused the current shortness of breath   . levothyroxine (SYNTHROID) 50 MCG tablet Take 1 tablet (50 mcg total) by mouth daily.   . Magnesium 300 MG CAPS Take 300 mg by mouth at bedtime.    . metoprolol tartrate (LOPRESSOR) 50 MG tablet TAKE (1) TABLET TWICE A DAY. (Patient taking differently: Take 50 mg by mouth 2 (two) times daily. TAKE (1) TABLET TWICE A DAY.)   . nitroGLYCERIN (NITROSTAT) 0.4 MG SL tablet PLACE 1 TABLET UNDER THE TONGUE AT ONSET OF CHEST PAIN EVERY 5 MINTUES UP TO 3 TIMES AS NEEDED (Patient taking differently: Place 0.4 mg under the tongue every 5 (five) minutes as needed for chest pain. )   . traMADol (ULTRAM) 50 MG tablet Take 50 mg by mouth every  4 (four) hours as needed for moderate pain.   . TURMERIC PO Take 1 capsule by mouth daily after breakfast.   . vitamin B-12 (CYANOCOBALAMIN) 500 MCG tablet Take 1 tablet (500 mcg total) by mouth daily.    Facility-Administered Encounter Medications as of 10/06/2019  Medication  . DOBUTamine (DOBUTREX) 1,000 mcg/mL in dextrose 5% 250 mL infusion    Goals    .  Client will talk with LCSW in next 30 days to discuss client completion of ADLs and dialy tasks (pt-stated)      CARE PLAN ENTRY  Current Barriers:  [name] Mobility challenges in client with Chronic Diagnoses of CHF, HLD, HTN, OA, DDD, Vitamin D deficiency, AAA, s/p CABG X 4 . Short of breath on  occasion   Clinical Social Work Clinical Goal(s):  [name] LCSW to call client in next 30 days to tidsucs client completion of ADLs and daily activities  Interventions: . Talked with client about CCM program support . Talked with client about her mobility challenges . Talked with client about pain issues . Taked with clinet about her breathing challenges (occasional short of breath) . Talked with client about her heart surgery (heart surgery in February of 2021) . Talked with client aobut her upcoming client appointments . Talked with Letta Median about relaxation techniques of choice Wojdyla likes to cook , she likes to listen to music, and likes to read) . Talked with Trey about her family history of heart issues . Talked with Teriana about energy level (she feels that her energy has improved lately) . Talked with Letta Median about physical therapy sessions she is receiving weekly at [facility] . Talked with Letta Median about appointment of client with caridologist in August of 2021. . Talked with client about vision issues of client . Talked with Lucile about family support network . Talked with Leverne about transport needs . Client and LCSW talked about benefits she has received from doing physical therapy sessions at [facility] weekly  Patient Self Care Activities:  Takes medications as prescribed Attends scheduled medical appointments  . Patient Self Care Deficits:  [name] Mobility challenges . Shortness of breath occasionally  Initial goal documentation    Follow Up Plan: LCSW to call client in next 4 weeks to talk with her about her completion of daily ADLs and daily activities  [name].Danielly Ackerley MSW, LCSW Licensed Clinical Social Worker [facility] [phone]

## 2019-10-06 NOTE — Patient Instructions (Addendum)
Licensed Clinical Social Worker Visit Information  Goals we discussed today:      Client will talk with LCSW in next 30 days to discuss client completion of ADLs and dialy tasks (pt-stated)        CARE PLAN ENTRY  Current Barriers:   Mobility challenges in client with Chronic Diagnoses of CHF, HLD, HTN, OA, DDD, Vitamin D deficiency, AAA, s/p CABG X 4  Short of breath on occasion   Clinical Social Work Clinical Goal(s):   LCSW to call client in next 30 days to tidsucs client completion of ADLs and daily activities  Interventions:  Talked with client about CCM program support  Talked with client about her mobility challenges  Talked with client about pain issues  Taked with clinet about her breathing challenges (occasional short of breath)  Talked with client about her heart surgery (heart surgery in February of 2021)  Talked with client aobut her upcoming client appointments  Talked with Gabriela Smith about relaxation techniques of choice Gabriela Smith likes to [employer] , she likes to listen to music, and likes to read)  Talked with Gabriela Smith about her family history of heart issues  Talked with Gabriela Smith about energy level (she feels that her energy has improved lately)  Talked with Gabriela Smith about physical therapy sessions she is receiving weekly at [facility]  Talked with Gabriela Smith about appointment of client with caridologist in August of 2021.  Talked with client about vision issues of client  Talked with Gabriela Smith about family support network  Talked with Gabriela Smith about transport needs  Client and LCSW talked about benefits she has received from doing physical therapy sessions at [facility] weekly  Patient Self Care Activities:  Takes medications as prescribed Attends scheduled medical appointments   Patient Self Care Deficits:   Mobility challenges  Shortness of breath occasionally  Initial goal documentation    Follow Up Plan: LCSW to call client in next 4 weeks to  talk with her about her completion of daily ADLs and daily activities  Materials Provided: No  The patient verbalized understanding of instructions provided today and declined a print copy of patient instruction materials.   Gabriela Smith MSW, LCSW Licensed Clinical Social Worker [facility] [phone]

## 2019-10-07 ENCOUNTER — Encounter (HOSPITAL_COMMUNITY)
Admission: RE | Admit: 2019-10-07 | Discharge: 2019-10-07 | Disposition: A | Discharge status: Home / Self Care | Payer: Medicare Other | Source: Ambulatory Visit | Attending: Cardiology | Admitting: Cardiology

## 2019-10-07 ENCOUNTER — Other Ambulatory Visit: Payer: Self-pay

## 2019-10-07 DIAGNOSIS — Z951 Presence of aortocoronary bypass graft: Secondary | ICD-10-CM

## 2019-10-07 NOTE — Progress Notes (Signed)
Daily Session Note  Patient Details  Name: Gabriela Smith MRN: [id number] Date of Birth: [birth year] Referring Provider:     CARDIAC REHAB PHASE II ORIENTATION from 08/29/2019 in [place]  Referring Provider Dr. Percival Spanish      Encounter Date: 10/07/2019  Check In:  Session Check In - 10/07/19 1100      Check-In   Supervising physician immediately available to respond to emergencies See telemetry face sheet for immediately available MD    Location [facility]    Staff Present Algis Downs, Exercise Physiologist;Carlette Wilber Oliphant, RN, BSN    Virtual Visit No    Medication changes reported     No    Fall or balance concerns reported    No    Tobacco Cessation No Change    Warm-up and Cool-down Performed as group-led instruction    Resistance Training Performed Yes    VAD Patient? No    PAD/SET Patient? No      Pain Assessment   Currently in Pain? No/denies    Pain Score 0-No pain    Multiple Pain Sites No           Capillary Blood Glucose: No results found for this or any previous visit (from the past 24 hour(s)).    Social History   Tobacco Use  Smoking Status Former Smoker  . Packs/day: 0.50  . Years: 5.00  . Pack years: 2.50  . Types: Cigarettes  . Quit date: 03/09/1976  . Years since quitting: 43.6  Smokeless Tobacco Never Used    Goals Met:  Independence with exercise equipment Exercise tolerated well No report of cardiac concerns or symptoms Strength training completed today  Goals Unmet:  Not Applicable  Comments: Check out: 1200   Dr. Kathie Dike is Medical Director for [facility] Pulmonary Re[facility].

## 2019-10-10 ENCOUNTER — Encounter (HOSPITAL_COMMUNITY)
Admission: RE | Admit: 2019-10-10 | Discharge: 2019-10-10 | Disposition: A | Discharge status: Home / Self Care | Payer: Medicare Other | Source: Ambulatory Visit | Attending: Cardiology | Admitting: Cardiology

## 2019-10-10 ENCOUNTER — Other Ambulatory Visit: Payer: Self-pay

## 2019-10-10 DIAGNOSIS — Z951 Presence of aortocoronary bypass graft: Secondary | ICD-10-CM | POA: Diagnosis not present

## 2019-10-10 NOTE — Progress Notes (Signed)
Daily Session Note  Patient Details  Name: Gabriela Smith MRN: [id number] Date of Birth: [birth year] Referring Provider:     [place] from 08/29/2019 in [place]  Referring Provider Dr. Percival Spanish      Encounter Date: 10/10/2019  Check In:  Session Check In - 10/10/19 1100      Check-In   Supervising physician immediately available to respond to emergencies See telemetry face sheet for immediately available MD    Location [facility]    Staff Present Hoy Register, MS, ACSM-CEP, Exercise Physiologist;Henryetta Corriveau, Exercise Physiologist;Carlette Wilber Oliphant, RN, BSN    Virtual Visit No    Medication changes reported     No    Fall or balance concerns reported    No    Tobacco Cessation No Change    Warm-up and Cool-down Performed as group-led instruction    Resistance Training Performed Yes    VAD Patient? No    PAD/SET Patient? No      Pain Assessment   Currently in Pain? No/denies    Pain Score 0-No pain    Multiple Pain Sites No           Capillary Blood Glucose: No results found for this or any previous visit (from the past 24 hour(s)).    Social History   Tobacco Use  Smoking Status Former Smoker  . Packs/day: 0.50  . Years: 5.00  . Pack years: 2.50  . Types: Cigarettes  . Quit date: 03/09/1976  . Years since quitting: 43.6  Smokeless Tobacco Never Used    Goals Met:  Independence with exercise equipment Exercise tolerated well No report of cardiac concerns or symptoms Strength training completed today  Goals Unmet:  Not Applicable  Comments: Check out: 1200   Dr. Kathie Dike is Medical Director for [facility] Pulmonary Rehab.

## 2019-10-10 NOTE — Progress Notes (Signed)
Cardiology Office Note   Date:  10/12/2019   ID:  Gabriela Smith, Gabriela Smith 16-Mar-1940, MRN [id number]  PCP:  [name], DO  Cardiologist:   Minus Breeding, MD   Chief Complaint  Patient presents with  . Coronary Artery Disease      History of Present Illness: Gabriela Smith is a 79 y.o. female who presents for follow up after CABG.    She had a cardiac cath on 04/19/2019 noting severe multivessel disease, with mild osteal and severe distal left main disease involving the LCx with severe proximal and mid LAD diease, as well as ostial and mid RCA diffuse disease. As a result, she underwent CABG X 4 with LIMA to LAD, SVG to Mid LAD, SVG to OM, and SVG to PDA.   She has been doing cardiac rehab.  The patient denies any new symptoms such as chest discomfort, neck or arm discomfort. There has been no new shortness of breath, PND or orthopnea. There have been no reported palpitations, presyncope or syncope.    She is having none of the shortness of breath that was her most recent angina.    Past Medical History:  Diagnosis Date  . AAA (abdominal aortic aneurysm) [facility])    cardiology aware   . Arthritis    OA lt knee- cortizone inj. q 4 months AND OA ALSO IN BACK. Had knee replaced-issue resolved  . CAD (coronary artery disease) CARDIOLOGIST- DR [facility]--  VISIT 01-02-10 IN EPIC   1993-- PTCA. Pt describes a near total blockage of apparently the LAD and a 65% stenosis elsewhere.  . Cancer ([place])   . History of bladder cancer followed by dr Karsten Ro   hx  TCC of bladder ,  Ta G3-  first occurence 02-20-2012--  s/p BCG tx's  and  mitomycin C  . Hyperlipidemia 1993  . Hypertension 1993  . Nocturia   . Osteopenia   . Sigmoid diverticulosis     Past Surgical History:  Procedure Laterality Date  . CHOLECYSTECTOMY  2003 (approx)  . CLIPPING OF ATRIAL APPENDAGE N/A 04/25/2019   Procedure: Clipping Of Atrial Appendage;  Surgeon: Lajuana Matte, MD;  Location: [place];  Service: Open  Heart Surgery;  Laterality: N/A;  . COLONOSCOPY  05-31-2003  . CORONARY ANGIOPLASTY  1993   to LAD  . CORONARY ARTERY BYPASS GRAFT N/A 04/25/2019   Procedure: CORONARY ARTERY BYPASS GRAFTING (CABG) times four using left internal mammary artery and left greater saphenous vein harvested endoscipically.;  Surgeon: Lajuana Matte, MD;  Location: [place];  Service: Open Heart Surgery;  Laterality: N/A;  . CYSTOSCOPY WITH BIOPSY  05/09/2011   Procedure: CYSTOSCOPY WITH BIOPSY;  Surgeon: Claybon Jabs, MD;  Location: [facility];  Service: Urology;  Laterality: N/A;  WITH BLADDER BIOPSY GYRUS  . LEFT HEART CATH AND CORONARY ANGIOGRAPHY N/A 04/19/2019   Procedure: LEFT HEART CATH AND CORONARY ANGIOGRAPHY;  Surgeon: Leonie Man, MD;  Location: [place] [facility];  Service: Cardiovascular;  Laterality: N/A;  . TOTAL HIP ARTHROPLASTY Right 12/25/2015   Procedure: RIGHT TOTAL HIP ARTHROPLASTY ANTERIOR APPROACH;  Surgeon: Paralee Cancel, MD;  Location: [facility];  Service: Orthopedics;  Laterality: Right;  . TOTAL KNEE ARTHROPLASTY Left 11/22/2012   Procedure: LEFT TOTAL KNEE ARTHROPLASTY;  Surgeon: Mauri Pole, MD;  Location: [facility];  Service: Orthopedics;  Laterality: Left;  . TRANSURETHRAL RESECTION OF BLADDER TUMOR  03/14/2011   Procedure: TRANSURETHRAL RESECTION OF BLADDER TUMOR (  TURBT);  Surgeon: Claybon Jabs, MD;  Location: [facility];  Service: Urology;  Laterality: N/A;  . TRANSURETHRAL RESECTION OF BLADDER TUMOR  05/09/2011   Procedure: TRANSURETHRAL RESECTION OF BLADDER TUMOR (TURBT);  Surgeon: Claybon Jabs, MD;  Location: [facility];  Service: Urology;  Laterality: N/A;  . TRANSURETHRAL RESECTION OF BLADDER TUMOR  03/08/2012   Procedure: TRANSURETHRAL RESECTION OF BLADDER TUMOR (TURBT);  Surgeon: Claybon Jabs, MD;  Location: [facility];  Service: Urology;  Laterality: N/A;  . TRANSURETHRAL RESECTION OF BLADDER TUMOR WITH GYRUS  (TURBT-GYRUS) N/A 03/24/2014   Procedure: TRANSURETHRAL RESECTION OF BLADDER TUMOR WITH GYRUS (TURBT-GYRUS);  Surgeon: Claybon Jabs, MD;  Location: [facility];  Service: Urology;  Laterality: N/A;  . UMBILICAL HERNIA REPAIR  2001  (approx)     Current Outpatient Medications  Medication Sig Dispense Refill  . acetaminophen (TYLENOL) 500 MG tablet Take 2 tablets (1,000 mg total) by mouth every 8 (eight) hours. 30 tablet 0  . apixaban (ELIQUIS) 5 MG TABS tablet Take 1 tablet (5 mg total) by mouth 2 (two) times daily. 60 tablet 6  . aspirin EC 81 MG EC tablet Take 1 tablet (81 mg total) by mouth daily.    . cholecalciferol (VITAMIN D) 1000 UNITS tablet Take 1,000 Units by mouth daily after breakfast.     . Co-Enzyme Q10 200 MG CAPS Take 200 mg by mouth daily after breakfast.     . digoxin (LANOXIN) 0.125 MG tablet Take 1 tablet (0.125 mg total) by mouth daily. 30 tablet 3  . Evolocumab (REPATHA SURECLICK) 983 MG/ML SOAJ Inject 1 Dose into the skin every 14 (fourteen) days. 2 pen 11  . Flaxseed, Linseed, (FLAX SEED OIL) 1000 MG CAPS Take 1,000 mg by mouth daily after breakfast.     . gabapentin (NEURONTIN) 100 MG capsule Take 1 capsule (100 mg total) by mouth at bedtime. 90 capsule 3  . levothyroxine (SYNTHROID) 50 MCG tablet Take 1 tablet (50 mcg total) by mouth daily. 90 tablet 0  . Magnesium 300 MG CAPS Take 300 mg by mouth at bedtime.     . metoprolol tartrate (LOPRESSOR) 50 MG tablet TAKE (1) TABLET TWICE A DAY. 180 tablet 3  . nitroGLYCERIN (NITROSTAT) 0.4 MG SL tablet PLACE 1 TABLET UNDER THE TONGUE AT ONSET OF CHEST PAIN EVERY 5 MINTUES UP TO 3 TIMES AS NEEDED 25 tablet 0  . traMADol (ULTRAM) 50 MG tablet Take 50 mg by mouth every 4 (four) hours as needed for moderate pain.    . TURMERIC PO Take 1 capsule by mouth daily after breakfast.    . vitamin B-12 (CYANOCOBALAMIN) 500 MCG tablet Take 1 tablet (500 mcg total) by mouth daily. 30 tablet 3  . amLODipine (NORVASC) 5 MG  tablet Take 1 tablet (5 mg total) by mouth daily. 90 tablet 3   No current facility-administered medications for this visit.   Facility-Administered Medications Ordered in Other Visits  Medication Dose Route Frequency Provider Last Rate Last Admin  . DOBUTamine (DOBUTREX) 1,000 mcg/mL in dextrose 5% 250 mL infusion  20 mcg/kg/min Intravenous Continuous Dorothy Spark, MD 96.4 mL/hr at 12/24/15 0840 20 mcg/kg/min at 12/24/15 0840    Allergies:   Fish allergy, Fish-derived products, Shellfish-derived products, Lasix [furosemide], Macrodantin [nitrofurantoin macrocrystal], Vicodin [hydrocodone-acetaminophen], Zetia [ezetimibe], Codeine, Latex, Niacin, Niaspan [niacin er], Statins, and Sulfa antibiotics    ROS:  Please see the history of present illness.   Otherwise,  review of systems are positive for none.   All other systems are reviewed and negative.    PHYSICAL EXAM: VS:  BP (!) 182/76   Pulse 63   Ht 5\' 3"  (1.6 m)   Wt 173 lb (78.5 kg)   SpO2 97%   BMI 30.65 kg/m  , BMI Body mass index is 30.65 kg/m. GENERAL:  Well appearing NECK:  No jugular venous distention, waveform within normal limits, carotid upstroke brisk and symmetric, no bruits, no thyromegaly LYMPHATICS:  No cervical, inguinal adenopathy LUNGS:  Clear to auscultation bilaterally CHEST:  Well healed sternotomy scar. HEART:  PMI not displaced or sustained,S1 and S2 within normal limits, no S3, no S4, no clicks, no rubs, no murmurs ABD:  Flat, positive bowel sounds normal in frequency in pitch, no bruits, no rebound, no guarding, no midline pulsatile mass, no hepatomegaly, no splenomegaly EXT:  2 plus pulses throughout, no edema, no cyanosis no clubbing   EKG:  EKG is ordered today. The ekg ordered today demonstrates sinus rhythm, rate 62, axis within normal limits, intervals within normal limits, nonspecific inferolateral T wave changes, poor anterior R wave progression, possible old anteroseptal  infarct.    Recent Labs: 04/27/2019: Magnesium 2.2 05/09/2019: BNP 393.1 08/12/2019: ALT 25; BUN 13; Creatinine, Ser 0.81; Hemoglobin 11.8; Platelets 248; Potassium 4.7; Sodium 139; TSH 8.590    Lipid Panel    Component Value Date/Time   CHOL 144 08/12/2019 1043   TRIG 224 (H) 08/12/2019 1043   TRIG 414 (H) 07/31/2016 0942   HDL 46 08/12/2019 1043   HDL 49 07/31/2016 0942   CHOLHDL 3.1 08/12/2019 1043   CHOLHDL 3.8 04/16/2019 0508   VLDL 26 04/16/2019 0508   LDLCALC 62 08/12/2019 1043   LDLCALC 205 (H) 11/28/2013 1215   LDLDIRECT 193 (H) 10/21/2016 0902      Wt Readings from Last 3 Encounters:  10/12/19 173 lb (78.5 kg)  10/03/19 175 lb 7.8 oz (79.6 kg)  09/29/19 169 lb (76.7 kg)      Other studies Reviewed: Additional studies/ records that were reviewed today include: Labs. Review of the above records demonstrates:  Please see elsewhere in the note.     ASSESSMENT AND PLAN:   Lower extremity edema:    This is improved.  No change in therapy.  Coronary artery disease :   She is now status post bypass and doing well.  She will continue with cardiac rehab and risk reduction.   Atrial fibrillation:    She did have prolonged atrial fibrillation postop but she has had none since then.  I would like to try her off of amiodarone but will continue anticoagulation for now.   Hyperlipidemia:    She has had an excellent result on Repatha with an LDL of 62 down from 142.    Essential hypertension: Her blood pressure is not at target and I am going to increase her amlodipine to 5 mg daily.    Current medicines are reviewed at length with the patient today.  The patient does not have concerns regarding medicines.  The following changes have been made:  no change  Labs/ tests ordered today include: None  Orders Placed This Encounter  Procedures  . EKG 12-Lead     Disposition:   FU with me in 3 months.     Signed, Minus Breeding, MD  10/12/2019 12:39 PM    [facility]

## 2019-10-11 ENCOUNTER — Ambulatory Visit: Payer: Medicare Other | Admitting: Cardiology

## 2019-10-12 ENCOUNTER — Ambulatory Visit: Payer: Medicare Other | Admitting: Cardiology

## 2019-10-12 ENCOUNTER — Encounter: Payer: Self-pay | Admitting: Cardiology

## 2019-10-12 ENCOUNTER — Other Ambulatory Visit: Payer: Self-pay

## 2019-10-12 ENCOUNTER — Encounter (HOSPITAL_COMMUNITY): Payer: Medicare Other

## 2019-10-12 VITALS — BP 182/76 | HR 63 | Ht 63.0 in | Wt 173.0 lb

## 2019-10-12 DIAGNOSIS — I10 Essential (primary) hypertension: Secondary | ICD-10-CM | POA: Diagnosis not present

## 2019-10-12 DIAGNOSIS — I480 Paroxysmal atrial fibrillation: Secondary | ICD-10-CM | POA: Diagnosis not present

## 2019-10-12 MED ORDER — AMLODIPINE BESYLATE 5 MG PO TABS: 5.0000 mg | ORAL_TABLET | Freq: Every day | ORAL | 3 refills | Status: DC

## 2019-10-12 NOTE — Patient Instructions (Signed)
Medication Instructions:  Please discontinue your Amiodarone. Increase Amlodipine to 5 mg a day.  Continue all other medications as listed.  *If you need a refill on your cardiac medications before your next appointment, please call your pharmacy*  Follow-Up: At [facility], you and your health needs are our priority.  As part of our continuing mission to provide you with exceptional heart care, we have created designated Provider Care Teams.  These Care Teams include your primary Cardiologist (physician) and Advanced Practice Providers (APPs -  Physician Assistants and Nurse Practitioners) who all work together to provide you with the care you need, when you need it.  We recommend signing up for the patient portal called "MyChart".  Sign up information is provided on this After Visit Summary.  MyChart is used to connect with patients for Virtual Visits (Telemedicine).  Patients are able to view lab/test results, encounter notes, upcoming appointments, etc.  Non-urgent messages can be sent to your provider as well.   To learn more about what you can do with MyChart, go to [email or web address].    Your next appointment:   3 month(s)  The format for your next appointment:   In Person  Provider:   Minus Breeding, MD   Thank you for choosing [facility]!!

## 2019-10-12 NOTE — Progress Notes (Signed)
Cardiac Individual Treatment Plan  Patient Details  Name: Gabriela Smith MRN: [id number] Date of Birth: [birth year] Referring Provider:     [facility] from 08/29/2019 in [place]  Referring Provider Dr. Percival Spanish      Initial Encounter Date:    CARDIAC REHAB PHASE II ORIENTATION from 08/29/2019 in [place]  Date 08/29/19      Visit Diagnosis: S/P CABG x 4  Patient's Home Medications on Admission:  Current Outpatient Medications:  .  acetaminophen (TYLENOL) 500 MG tablet, Take 2 tablets (1,000 mg total) by mouth every 8 (eight) hours. (Patient taking differently: Take 1,000 mg by mouth at bedtime as needed for headache (pain/sleep). ), Disp: 30 tablet, Rfl: 0 .  amiodarone (PACERONE) 200 MG tablet, Take 1 tablet (200 mg total) by mouth 2 (two) times daily. For 2 days, then decrease to 200 mg daily (Patient taking differently: Take 200 mg by mouth at bedtime. ), Disp: 60 tablet, Rfl: 1 .  amLODipine (NORVASC) 2.5 MG tablet, Take 1 tablet (2.5 mg total) by mouth daily., Disp: 90 tablet, Rfl: 3 .  apixaban (ELIQUIS) 5 MG TABS tablet, Take 1 tablet (5 mg total) by mouth 2 (two) times daily., Disp: 60 tablet, Rfl: 6 .  aspirin EC 81 MG EC tablet, Take 1 tablet (81 mg total) by mouth daily., Disp:  , Rfl:  .  cholecalciferol (VITAMIN D) 1000 UNITS tablet, Take 1,000 Units by mouth daily after breakfast. , Disp: , Rfl:  .  Co-Enzyme Q10 200 MG CAPS, Take 200 mg by mouth daily after breakfast. , Disp: , Rfl:  .  digoxin (LANOXIN) 0.125 MG tablet, Take 1 tablet (0.125 mg total) by mouth daily., Disp: 30 tablet, Rfl: 3 .  Evolocumab (REPATHA SURECLICK) 599 MG/ML SOAJ, Inject 1 Dose into the skin every 14 (fourteen) days., Disp: 2 pen, Rfl: 11 .  Flaxseed, Linseed, (FLAX SEED OIL) 1000 MG CAPS, Take 1,000 mg by mouth daily after breakfast. , Disp: , Rfl:  .  gabapentin (NEURONTIN) 100 MG capsule, Take 1 capsule (100 mg total) by  mouth at bedtime., Disp: 90 capsule, Rfl: 3 .  levothyroxine (SYNTHROID) 50 MCG tablet, Take 1 tablet (50 mcg total) by mouth daily., Disp: 90 tablet, Rfl: 0 .  Magnesium 300 MG CAPS, Take 300 mg by mouth at bedtime. , Disp: , Rfl:  .  metoprolol tartrate (LOPRESSOR) 50 MG tablet, TAKE (1) TABLET TWICE A DAY. (Patient taking differently: Take 50 mg by mouth 2 (two) times daily. TAKE (1) TABLET TWICE A DAY.), Disp: 180 tablet, Rfl: 3 .  nitroGLYCERIN (NITROSTAT) 0.4 MG SL tablet, PLACE 1 TABLET UNDER THE TONGUE AT ONSET OF CHEST PAIN EVERY 5 MINTUES UP TO 3 TIMES AS NEEDED (Patient taking differently: Place 0.4 mg under the tongue every 5 (five) minutes as needed for chest pain. ), Disp: 25 tablet, Rfl: 0 .  traMADol (ULTRAM) 50 MG tablet, Take 50 mg by mouth every 4 (four) hours as needed for moderate pain., Disp: , Rfl:  .  TURMERIC PO, Take 1 capsule by mouth daily after breakfast., Disp: , Rfl:  .  vitamin B-12 (CYANOCOBALAMIN) 500 MCG tablet, Take 1 tablet (500 mcg total) by mouth daily., Disp: 30 tablet, Rfl: 3 No current facility-administered medications for this encounter.  Facility-Administered Medications Ordered in Other Encounters:  .  DOBUTamine (DOBUTREX) 1,000 mcg/mL in dextrose 5% 250 mL infusion, 20 mcg/kg/min, Intravenous, Continuous, Meda Coffee, Jamse Belfast, MD, Last Rate:  96.4 mL/hr at 12/24/15 0840, 20 mcg/kg/min at 12/24/15 0840  Past Medical History: Past Medical History:  Diagnosis Date  . AAA (abdominal aortic aneurysm) [facility])    cardiology aware   . Arthritis    OA lt knee- cortizone inj. q 4 months AND OA ALSO IN BACK. Had knee replaced-issue resolved  . CAD (coronary artery disease) CARDIOLOGIST- [facility]--  VISIT 01-02-10 IN [facility]   1993-- PTCA. Pt describes a near total blockage of apparently the LAD and a 65% stenosis elsewhere.  . Cancer ([place])   . History of bladder cancer followed by dr Karsten Ro   hx  TCC of bladder ,  Ta G3-  first occurence 02-20-2012--  s/p BCG  tx's  and  mitomycin C  . Hyperlipidemia 1993  . Hypertension 1993  . Nocturia   . Osteopenia   . Sigmoid diverticulosis     Tobacco Use: Social History   Tobacco Use  Smoking Status Former Smoker  . Packs/day: 0.50  . Years: 5.00  . Pack years: 2.50  . Types: Cigarettes  . Quit date: 03/09/1976  . Years since quitting: 43.6  Smokeless Tobacco Never Used    Labs: Recent Chemical engineer    Labs for ITP Cardiac and Pulmonary Rehab Latest Ref Rng & Units 04/25/2019 04/25/2019 04/25/2019 04/28/2019 08/12/2019   Cholestrol 100 - 199 mg/dL - - - - 144   LDLCALC 0 - 99 mg/dL - - - - 62   LDLDIRECT 0 - 99 mg/dL - - - - -   HDL >39 mg/dL - - - - 46   Trlycerides 0 - 149 mg/dL - - - - 224(H)   Hemoglobin A1c 4.8 - 5.6 % - - - - -   PHART 7.35 - 7.45 7.303(L) 7.344(L) 7.349(L) - -   PCO2ART 32 - 48 mmHg 41.8 35.2 40.8 - -   HCO3 20.0 - 28.0 mmol/L 20.9 19.3(L) 22.6 24.0 -   TCO2 22 - 32 mmol/L 22 20(L) 24 25 -   ACIDBASEDEF 0.0 - 2.0 mmol/L 5.0(H) 6.0(H) 3.0(H) 1.0 -   O2SAT % 96.0 99.0 98.0 59.0 -      Capillary Blood Glucose: Lab Results  Component Value Date   GLUCAP 108 (H) 05/02/2019   GLUCAP 96 05/01/2019   GLUCAP 122 (H) 05/01/2019   GLUCAP 102 (H) 05/01/2019   GLUCAP 93 05/01/2019     Exercise Target Goals: Exercise Program Goal: Individual exercise prescription set using results from initial 6 min walk test and THRR while considering  patient's activity barriers and safety.   Exercise Prescription Goal: Starting with aerobic activity 30 plus minutes a day, 3 days per week for initial exercise prescription. Provide home exercise prescription and guidelines that participant acknowledges understanding prior to discharge.  Activity Barriers & Risk Stratification:  Activity Barriers & Cardiac Risk Stratification - 08/29/19 1207      Activity Barriers & Cardiac Risk Stratification   Activity Barriers Deconditioning    Cardiac Risk Stratification High            6 Minute Walk:  6 Minute Walk    Row Name 08/29/19 1158         6 Minute Walk   Phase Initial     Distance 1200 feet     Walk Time 6 minutes     # of Rest Breaks 0     MPH 2.27     METS 2.24     RPE 13  VO2 Peak 7.83     Symptoms No     Resting HR 62 bpm     Resting BP 170/64     Resting Oxygen Saturation  95 %     Exercise Oxygen Saturation  during 6 min walk 2.39 %     Max Ex. HR 96 bpm     Max Ex. BP 182/76     2 Minute Post BP 158/64            Oxygen Initial Assessment:   Oxygen Re-Evaluation:   Oxygen Discharge (Final Oxygen Re-Evaluation):   Initial Exercise Prescription:  Initial Exercise Prescription - 08/29/19 1200      Date of Initial Exercise RX and Referring Provider   Date 08/29/19    Referring Provider Dr. Percival Spanish    Expected Discharge Date 11/29/19      Treadmill   MPH 1.5    Grade 0    Minutes 17    METs 2.14      T5 Nustep   Level 1    SPM 80    Minutes 22      Prescription Details   Frequency (times per week) 3    Duration Progress to 30 minutes of continuous aerobic without signs/symptoms of physical distress      Intensity   THRR 40-80% of Max Heartrate 56-112    Ratings of Perceived Exertion 11-13      Progression   Progression Continue progressive overload as per policy without signs/symptoms or physical distress.      Resistance Training   Training Prescription Yes    Weight 1    Reps 10-15           Perform Capillary Blood Glucose checks as needed.  Exercise Prescription Changes:  Exercise Prescription Changes    Row Name 09/05/19 1100 09/19/19 1400 10/03/19 1300         Response to Exercise   Blood Pressure (Admit) 144/78 170/60 170/74     Blood Pressure (Exercise) 150/66 156/74 168/64     Blood Pressure (Exit) 144/74 132/62 148/80     Heart Rate (Admit) 70 bpm 66 bpm 64 bpm     Heart Rate (Exercise) 87 bpm 85 bpm 82 bpm     Heart Rate (Exit) 78 bpm 75 bpm 73 bpm     Rating of Perceived  Exertion (Exercise) 13 13 13      Duration Continue with 30 min of aerobic exercise without signs/symptoms of physical distress. Continue with 30 min of aerobic exercise without signs/symptoms of physical distress. Continue with 30 min of aerobic exercise without signs/symptoms of physical distress.     Intensity THRR unchanged THRR unchanged THRR unchanged       Progression   Progression Continue to progress workloads to maintain intensity without signs/symptoms of physical distress. Continue to progress workloads to maintain intensity without signs/symptoms of physical distress. Continue to progress workloads to maintain intensity without signs/symptoms of physical distress.       Resistance Training   Training Prescription Yes Yes Yes     Weight 1 1 1      Reps 10-15 10-15 10-15       Treadmill   MPH 1.5 1.5 1.6     Grade 0 0 0     Minutes 17 17 17      METs 2.15 2.15 2.3       T5 Nustep   Level 1 1 1      SPM 85 93 93  Minutes 22 22 22      METs 1.3 2.1 2.2            Exercise Comments:   Exercise Goals and Review:  Exercise Goals    Row Name 08/29/19 1205 10/10/19 1425           Exercise Goals   Increase Physical Activity Yes Yes      Intervention Develop an individualized exercise prescription for aerobic and resistive training based on initial evaluation findings, risk stratification, comorbidities and participant's personal goals.;Provide advice, education, support and counseling about physical activity/exercise needs. Provide advice, education, support and counseling about physical activity/exercise needs.;Develop an individualized exercise prescription for aerobic and resistive training based on initial evaluation findings, risk stratification, comorbidities and participant's personal goals.      Expected Outcomes Short Term: Attend rehab on a regular basis to increase amount of physical activity.;Long Term: Add in home exercise to make exercise part of routine and to  increase amount of physical activity.;Long Term: Exercising regularly at least 3-5 days a week. Short Term: Attend rehab on a regular basis to increase amount of physical activity.;Long Term: Add in home exercise to make exercise part of routine and to increase amount of physical activity.;Long Term: Exercising regularly at least 3-5 days a week.      Increase Strength and Stamina Yes Yes      Intervention Provide advice, education, support and counseling about physical activity/exercise needs.;Develop an individualized exercise prescription for aerobic and resistive training based on initial evaluation findings, risk stratification, comorbidities and participant's personal goals. Provide advice, education, support and counseling about physical activity/exercise needs.;Develop an individualized exercise prescription for aerobic and resistive training based on initial evaluation findings, risk stratification, comorbidities and participant's personal goals.      Expected Outcomes Short Term: Increase workloads from initial exercise prescription for resistance, speed, and METs.;Short Term: Perform resistance training exercises routinely during rehab and add in resistance training at home;Long Term: Improve cardiorespiratory fitness, muscular endurance and strength as measured by increased METs and functional capacity (6MWT) Short Term: Increase workloads from initial exercise prescription for resistance, speed, and METs.;Short Term: Perform resistance training exercises routinely during rehab and add in resistance training at home;Long Term: Improve cardiorespiratory fitness, muscular endurance and strength as measured by increased METs and functional capacity (6MWT)      Able to understand and use rate of perceived exertion (RPE) scale Yes Yes      Intervention Provide education and explanation on how to use RPE scale Provide education and explanation on how to use RPE scale      Expected Outcomes Short Term:  Able to use RPE daily in rehab to express subjective intensity level;Long Term:  Able to use RPE to guide intensity level when exercising independently Short Term: Able to use RPE daily in rehab to express subjective intensity level;Long Term:  Able to use RPE to guide intensity level when exercising independently      Knowledge and understanding of Target Heart Rate Range (THRR) Yes Yes      Intervention Provide education and explanation of THRR including how the numbers were predicted and where they are located for reference Provide education and explanation of THRR including how the numbers were predicted and where they are located for reference      Expected Outcomes Short Term: Able to state/look up THRR;Long Term: Able to use THRR to govern intensity when exercising independently;Short Term: Able to use daily as guideline for intensity in rehab Short Term:  Able to state/look up THRR;Long Term: Able to use THRR to govern intensity when exercising independently;Short Term: Able to use daily as guideline for intensity in rehab      Able to check pulse independently Yes Yes      Intervention Provide education and demonstration on how to check pulse in carotid and radial arteries.;Review the importance of being able to check your own pulse for safety during independent exercise Provide education and demonstration on how to check pulse in carotid and radial arteries.;Review the importance of being able to check your own pulse for safety during independent exercise      Expected Outcomes Short Term: Able to explain why pulse checking is important during independent exercise;Long Term: Able to check pulse independently and accurately Short Term: Able to explain why pulse checking is important during independent exercise;Long Term: Able to check pulse independently and accurately      Understanding of Exercise Prescription Yes Yes      Intervention Provide education, explanation, and written materials on  patient's individual exercise prescription Provide education, explanation, and written materials on patient's individual exercise prescription      Expected Outcomes Short Term: Able to explain program exercise prescription;Long Term: Able to explain home exercise prescription to exercise independently Short Term: Able to explain program exercise prescription;Long Term: Able to explain home exercise prescription to exercise independently             Exercise Goals Re-Evaluation :  Exercise Goals Re-Evaluation    Row Name 09/09/19 1414 10/10/19 1426           Exercise Goal Re-Evaluation   Exercise Goals Review Increase Physical Activity;Increase Strength and Stamina Increase Physical Activity;Increase Strength and Stamina;Able to understand and use rate of perceived exertion (RPE) scale;Knowledge and understanding of Target Heart Rate Range (THRR);Able to check pulse independently;Understanding of Exercise Prescription      Comments She has only done six session so far in the program. She said that she can tell that she has more energy. She is motivated to meet her goals and has a positve attitude. Her Systolic BP tend to run above 180 before sessions. Her doctor was notified about this upon entry to program. We will continue to monitor high BP and progress as tolerated. Patient said that she is able to play more with her grandchildren. 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 She has recently trying to cut out caffeine and is focusing on lowering her blood pressure.      Expected Outcomes To reach expected outcomes To reach expected outcomes              Discharge Exercise Prescription (Final Exercise Prescription Changes):  Exercise Prescription Changes - 10/03/19 1300      Response to Exercise   Blood Pressure (Admit) 170/74    Blood Pressure (Exercise) 168/64    Blood Pressure (Exit) 148/80    Heart Rate (Admit) 64 bpm    Heart Rate  (Exercise) 82 bpm    Heart Rate (Exit) 73 bpm    Rating of Perceived Exertion (Exercise) 13    Duration Continue with 30 min of aerobic exercise without signs/symptoms of physical distress.    Intensity THRR unchanged      Progression   Progression Continue to progress workloads to maintain intensity without signs/symptoms of physical distress.      Resistance Training   Training Prescription Yes    Weight 1  Reps 10-15      Treadmill   MPH 1.6    Grade 0    Minutes 17    METs 2.3      T5 Nustep   Level 1    SPM 93    Minutes 22    METs 2.2           Nutrition:  Target Goals: Understanding of nutrition guidelines, daily intake of sodium 1500mg , cholesterol 200mg , calories 30% from fat and 7% or less from saturated fats, daily to have 5 or more servings of fruits and vegetables.  Biometrics:  Pre Biometrics - 08/29/19 1206      Pre Biometrics   Height 5\' 3"  (1.6 m)    Weight 80 kg    Waist Circumference 41 inches    Hip Circumference 47 inches    Waist to Hip Ratio 0.87 %    BMI (Calculated) 31.25    Triceps Skinfold 26 mm    % Body Fat 43.7 %    Grip Strength 26 kg    Flexibility 0 in    Single Leg Stand 3.16 seconds            Nutrition Therapy Plan and Nutrition Goals:  Nutrition Therapy & Goals - 10/10/19 1442      Personal Nutrition Goals   Comments Patient says she still follows a low sodium diet and has recently started reducing her caffeine intake. She feels she is meeting her nutritional goals. Will continue to monitor.      Intervention Plan   Intervention Nutrition handout(s) given to patient.           Nutrition Assessments:  Nutrition Assessments - 08/29/19 1251      MEDFICTS Scores   Pre Score 21           Nutrition Goals Re-Evaluation:   Nutrition Goals Discharge (Final Nutrition Goals Re-Evaluation):   Psychosocial: Target Goals: Acknowledge presence or absence of significant depression and/or stress, maximize  coping skills, provide positive support system. Participant is able to verbalize types and ability to use techniques and skills needed for reducing stress and depression.  Initial Review & Psychosocial Screening:  Initial Psych Review & Screening - 08/29/19 1246      Initial Review   Current issues with None Identified      Family Dynamics   Good Support System? Yes      Barriers   Psychosocial barriers to participate in program There are no identifiable barriers or psychosocial needs.      Screening Interventions   Interventions Encouraged to exercise    Expected Outcomes Short Term goal: Identification and review with participant of any Quality of Life or Depression concerns found by scoring the questionnaire.;Long Term goal: The participant improves quality of Life and PHQ9 Scores as seen by post scores and/or verbalization of changes           Quality of Life Scores:  Quality of Life - 08/29/19 1119      Quality of Life   Select Quality of Life      Quality of Life Scores   Health/Function Pre 27.21 %    Socioeconomic Post 29 %    Psych/Spiritual Pre 29.14 %    Family Pre 28.8 %    GLOBAL Pre 28.48 %          Scores of 19 and below usually indicate a poorer quality of life in these areas.  A difference of  2-3 points is a  clinically meaningful difference.  A difference of 2-3 points in the total score of the Quality of Life Index has been associated with significant improvement in overall quality of life, self-image, physical symptoms, and general health in studies assessing change in quality of life.  PHQ-9: Recent Review Flowsheet Data    Depression screen [facility] 2/9 08/29/2019 08/12/2019 07/06/2019 02/17/2019 12/02/2018   Decreased Interest 0 0 0 0 0   Down, Depressed, Hopeless 0 0 0 0 0   PHQ - 2 Score 0 0 0 0 0   Altered sleeping 1 0 1 - -   Tired, decreased energy 0 0 0 - -   Change in appetite 0 0 1 - -   Feeling bad or failure about yourself  0 0 0 - -   Trouble  concentrating 0 0 1 - -   Moving slowly or fidgety/restless 0 0 1 - -   Suicidal thoughts 0 0 0 - -   PHQ-9 Score 1 0 4 - -   Difficult doing work/chores Not difficult at all - Somewhat difficult - -     Interpretation of Total Score  Total Score Depression Severity:  1-4 = Minimal depression, 5-9 = Mild depression, 10-14 = Moderate depression, 15-19 = Moderately severe depression, 20-27 = Severe depression   Psychosocial Evaluation and Intervention:  Psychosocial Evaluation - 08/29/19 1247      Psychosocial Evaluation & Interventions   Interventions Encouraged to exercise with the program and follow exercise prescription;Stress management education;Relaxation education    Comments Patient's initial QOL score was 28.48 an her PHQ-9 score was 1 with no psychosocial issues identified. 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 She rates her stress level as low. She says she is able to care for him without difficulty and plans to bring him with her to the sessions. Will continue to monitor.    Expected Outcomes Patient will have no psychosocial issues identified at discharge.    Continue Psychosocial Services  No Follow up required           Psychosocial Re-Evaluation:  Psychosocial Re-Evaluation    Amesti Name 09/07/19 1618 10/10/19 1444           Psychosocial Re-Evaluation   Current issues with None Identified None Identified      Comments Patient's initial QOL score was 28.48 and her PHQ-9 score was 1 with no psychosocial issues identified. She continues to be the primary caregiver for her husband with advanced Parkinson's disease but continues to say this is not stressful to her. Will continue to monitor. Patient continues to have no psychosocial issues identified. She continues to be her husbands caregiver denying and stress from this role. Will continue to monitor.      Expected Outcomes Patient will have no psychosocial issues identified at  discharge. Patient will have no psychosocial issues identified at discharge.      Interventions Encouraged to attend Cardiac Rehabilitation for the exercise;Stress management education;Relaxation education Encouraged to attend Cardiac Rehabilitation for the exercise;Stress management education;Relaxation education      Continue Psychosocial Services  No Follow up required --             Psychosocial Discharge (Final Psychosocial Re-Evaluation):  Psychosocial Re-Evaluation - 10/10/19 1444      Psychosocial Re-Evaluation   Current issues with None Identified    Comments Patient continues to have no psychosocial issues identified. She continues to be her husbands caregiver denying and stress from  this role. Will continue to monitor.    Expected Outcomes Patient will have no psychosocial issues identified at discharge.    Interventions Encouraged to attend Cardiac Rehabilitation for the exercise;Stress management education;Relaxation education           Vocational Rehabilitation: Provide vocational rehab assistance to qualifying candidates.   Vocational Rehab Evaluation & Intervention:  Vocational Rehab - 08/29/19 1252      Initial Vocational Rehab Evaluation & Intervention   Assessment shows need for Vocational Rehabilitation No      Vocational Rehab Re-Evaulation   Comments Patient is retired an is not interested in returning to [employer].           Education: Education Goals: Education classes will be provided on a weekly basis, covering required topics. Participant will state understanding/return demonstration of topics presented.  Learning Barriers/Preferences:  Learning Barriers/Preferences - 08/29/19 1251      Learning Barriers/Preferences   Learning Barriers None    Learning Preferences Written Material;Skilled Demonstration           Education Topics: Hypertension, Hypertension Reduction -Define heart disease and high blood pressure. Discus how high blood  pressure affects the body and ways to reduce high blood pressure.   CARDIAC REHAB PHASE II EXERCISE from 10/05/2019 in [place]  Date 09/14/19  Educator DF  Instruction Review Code 2- Demonstrated Understanding      Exercise and Your Heart -Discuss why it is important to exercise, the FITT principles of exercise, normal and abnormal responses to exercise, and how to exercise safely.   Angina -Discuss definition of angina, causes of angina, treatment of angina, and how to decrease risk of having angina.   Cardiac Medications -Review what the following cardiac medications are used for, how they affect the body, and side effects that may occur when taking the medications.  Medications include Aspirin, Beta blockers, calcium channel blockers, ACE Inhibitors, angiotensin receptor blockers, diuretics, digoxin, and antihyperlipidemics.   CARDIAC REHAB PHASE II EXERCISE from 10/05/2019 in [place]  Date 10/05/19  Educator DF  Instruction Review Code 1- Verbalizes Understanding      Congestive Heart Failure -Discuss the definition of CHF, how to live with CHF, the signs and symptoms of CHF, and how keep track of weight and sodium intake.   Heart Disease and Intimacy -Discus the effect sexual activity has on the heart, how changes occur during intimacy as we age, and safety during sexual activity.   Smoking Cessation / COPD -Discuss different methods to quit smoking, the health benefits of quitting smoking, and the definition of COPD.   Nutrition I: Fats -Discuss the types of cholesterol, what cholesterol does to the heart, and how cholesterol levels can be controlled.   Nutrition II: Labels -Discuss the different components of food labels and how to read food label   Heart Parts/Heart Disease and PAD -Discuss the anatomy of the heart, the pathway of blood circulation through the heart, and these are affected by heart  disease.   Stress I: Signs and Symptoms -Discuss the causes of stress, how stress may lead to anxiety and depression, and ways to limit stress.   Stress II: Relaxation -Discuss different types of relaxation techniques to limit stress.   Warning Signs of Stroke / TIA -Discuss definition of a stroke, what the signs and symptoms are of a stroke, and how to identify when someone is having stroke.   CARDIAC REHAB PHASE II EXERCISE from 10/05/2019 in [place]  Date 09/07/19  Educator Williamson  Instruction Review Code 1- Verbalizes Understanding      Knowledge Questionnaire Score:  Knowledge Questionnaire Score - 08/29/19 1251      Knowledge Questionnaire Score   Pre Score 22/24           Core Components/Risk Factors/Patient Goals at Admission:  Personal Goals and Risk Factors at Admission - 08/29/19 1252      Core Components/Risk Factors/Patient Goals on Admission    Weight Management Weight Maintenance    Hypertension Yes    Intervention Provide education on lifestyle modifcations including regular physical activity/exercise, weight management, moderate sodium restriction and increased consumption of fresh fruit, vegetables, and low fat dairy, alcohol moderation, and smoking cessation.;Monitor prescription use compliance.    Expected Outcomes Short Term: Continued assessment and intervention until BP is < 140/60mm HG in hypertensive participants. < 130/25mm HG in hypertensive participants with diabetes, heart failure or chronic kidney disease.;Long Term: Maintenance of blood pressure at goal levels.    Personal Goal Other Yes    Personal Goal Stay healthy; be able to enjoy her grandchildren. Live a healthy life.    Intervention Patient will attend CR 3 days/week and supplement with exercise 2 days/week.    Expected Outcomes Patient will complete the program meeting both program and personal goals.           Core Components/Risk Factors/Patient Goals Review:    Goals and Risk Factor Review    Row Name 09/07/19 1620 10/10/19 1445           Core Components/Risk Factors/Patient Goals Review   Personal Goals Review Weight Management/Obesity  Stay healthy; be able to enjoy her grandchildren; live a healthy life. Weight Management/Obesity  Stay healthy; be able to enjoy her grandchildren/live a healthy life.      Review Patient is new to the program completing 5 sessions maintaining her weight since her initial visit. She says she does feels stronger and better with only 5 session. Will continue to monitor. Patient has completed 13 sessions losing 1 lb since her last 30 day review. She is doing well in the program with progression and consistent attendance. She says she feels like the program helps her relax and she enjoys coming. She says she feels better both physically and mentally. Will continue to monitor.      Expected Outcomes Patient will continue to attend sessions and complete the program meeting both program and personal goals. Patient will continue to attend sessions and complete the program meeting both program and personal goals.             Core Components/Risk Factors/Patient Goals at Discharge (Final Review):   Goals and Risk Factor Review - 10/10/19 1445      Core Components/Risk Factors/Patient Goals Review   Personal Goals Review Weight Management/Obesity   Stay healthy; be able to enjoy her grandchildren/live a healthy life.   Review Patient has completed 13 sessions losing 1 lb since her last 30 day review. She is doing well in the program with progression and consistent attendance. She says she feels like the program helps her relax and she enjoys coming. She says she feels better both physically and mentally. Will continue to monitor.    Expected Outcomes Patient will continue to attend sessions and complete the program meeting both program and personal goals.           ITP Comments:   Comments: ITP REVIEW Pt is making  expected progress toward Cardiac Rehab goals  after completing 13 sessions. Recommend continued exercise, life style modification, education, and increased stamina and strength.

## 2019-10-13 DIAGNOSIS — B078 Other viral warts: Secondary | ICD-10-CM | POA: Diagnosis not present

## 2019-10-13 DIAGNOSIS — Z85828 Personal history of other malignant neoplasm of skin: Secondary | ICD-10-CM | POA: Diagnosis not present

## 2019-10-13 DIAGNOSIS — X32XXXD Exposure to sunlight, subsequent encounter: Secondary | ICD-10-CM | POA: Diagnosis not present

## 2019-10-13 DIAGNOSIS — L570 Actinic keratosis: Secondary | ICD-10-CM | POA: Diagnosis not present

## 2019-10-13 DIAGNOSIS — Z08 Encounter for follow-up examination after completed treatment for malignant neoplasm: Secondary | ICD-10-CM | POA: Diagnosis not present

## 2019-10-13 DIAGNOSIS — I872 Venous insufficiency (chronic) (peripheral): Secondary | ICD-10-CM | POA: Diagnosis not present

## 2019-10-14 ENCOUNTER — Encounter (HOSPITAL_COMMUNITY)
Admission: RE | Admit: 2019-10-14 | Discharge: 2019-10-14 | Disposition: A | Discharge status: Home / Self Care | Payer: Medicare Other | Source: Ambulatory Visit | Attending: Cardiology | Admitting: Cardiology

## 2019-10-14 ENCOUNTER — Other Ambulatory Visit: Payer: Self-pay

## 2019-10-14 DIAGNOSIS — Z951 Presence of aortocoronary bypass graft: Secondary | ICD-10-CM

## 2019-10-14 NOTE — Progress Notes (Signed)
I have reviewed a Home Exercise Prescription with [name] . Nalani is currently exercising at home.  The patient was advised to walk 2-3 days a week for 30-45 minutes.  Letta Median and I discussed how to progress their exercise prescription.  The patient stated that their goals were live a healthy life and be able to play with grandchildren.  The patient stated that they understand the exercise prescription.  We reviewed exercise guidelines, target heart rate during exercise, RPE Scale, weather conditions, NTG use, endpoints for exercise, warmup and cool down.  Patient is encouraged to come to me with any questions. I will continue to follow up with the patient to assist them with progression and safety.

## 2019-10-17 ENCOUNTER — Encounter (HOSPITAL_COMMUNITY)
Admission: RE | Admit: 2019-10-17 | Discharge: 2019-10-17 | Disposition: A | Discharge status: Home / Self Care | Payer: Medicare Other | Source: Ambulatory Visit | Attending: Cardiology | Admitting: Cardiology

## 2019-10-17 ENCOUNTER — Other Ambulatory Visit: Payer: Self-pay

## 2019-10-17 VITALS — Wt 173.7 lb

## 2019-10-17 DIAGNOSIS — Z951 Presence of aortocoronary bypass graft: Secondary | ICD-10-CM | POA: Diagnosis not present

## 2019-10-17 NOTE — Progress Notes (Signed)
Daily Session Note  Patient Details  Name: Gabriela Smith MRN: [id number] Date of Birth: 04/18/1940 Referring Provider:     CARDIAC REHAB PHASE II ORIENTATION from 08/29/2019 in [place]  Referring Provider Dr. Percival Spanish      Encounter Date: 10/17/2019  Check In:  Session Check In - 10/17/19 1100      Check-In   Supervising physician immediately available to respond to emergencies See telemetry face sheet for immediately available MD    Location [facility]    Staff Present Algis Downs, Exercise Physiologist;Dalton Kris Mouton, MS, ACSM-CEP, Exercise Physiologist    Virtual Visit No    Medication changes reported     No    Fall or balance concerns reported    No    Tobacco Cessation No Change    Warm-up and Cool-down Performed as group-led instruction    Resistance Training Performed Yes    VAD Patient? No    PAD/SET Patient? No      Pain Assessment   Currently in Pain? No/denies    Pain Score 0-No pain    Multiple Pain Sites No           Capillary Blood Glucose: No results found for this or any previous visit (from the past 24 hour(s)).    Social History   Tobacco Use  Smoking Status Former Smoker  . Packs/day: 0.50  . Years: 5.00  . Pack years: 2.50  . Types: Cigarettes  . Quit date: 03/09/1976  . Years since quitting: 43.6  Smokeless Tobacco Never Used    Goals Met:  Independence with exercise equipment Exercise tolerated well No report of cardiac concerns or symptoms Strength training completed today  Goals Unmet:  Not Applicable  Comments: Check out: 1200   Dr. Kathie Dike is Medical Director for [facility].

## 2019-10-19 ENCOUNTER — Encounter (HOSPITAL_COMMUNITY)
Admission: RE | Admit: 2019-10-19 | Discharge: 2019-10-19 | Disposition: A | Discharge status: Home / Self Care | Payer: Medicare Other | Source: Ambulatory Visit | Attending: Cardiology | Admitting: Cardiology

## 2019-10-19 ENCOUNTER — Other Ambulatory Visit: Payer: Self-pay

## 2019-10-19 DIAGNOSIS — Z951 Presence of aortocoronary bypass graft: Secondary | ICD-10-CM | POA: Diagnosis not present

## 2019-10-19 NOTE — Progress Notes (Signed)
Daily Session Note  Patient Details  Name: Gabriela Smith MRN: [id number] Date of Birth: 09/11/1940 Referring Provider:     CARDIAC REHAB PHASE II ORIENTATION from 08/29/2019 in [place]  Referring Provider Dr. Percival Spanish      Encounter Date: 10/19/2019  Check In:  Session Check In - 10/19/19 1103      Check-In   Supervising physician immediately available to respond to emergencies See telemetry face sheet for immediately available MD    Location [facility]    Staff Present Algis Downs, Exercise Physiologist;Kareen Jefferys Kris Mouton, MS, ACSM-CEP, Exercise Physiologist;Debra Wynetta Emery, RN, BSN    Virtual Visit No    Medication changes reported     No    Fall or balance concerns reported    No    Tobacco Cessation No Change    Warm-up and Cool-down Performed as group-led instruction    Resistance Training Performed Yes    VAD Patient? No    PAD/SET Patient? No      Pain Assessment   Currently in Pain? No/denies    Pain Score 0-No pain    Multiple Pain Sites No           Capillary Blood Glucose: No results found for this or any previous visit (from the past 24 hour(s)).    Social History   Tobacco Use  Smoking Status Former Smoker  . Packs/day: 0.50  . Years: 5.00  . Pack years: 2.50  . Types: Cigarettes  . Quit date: 03/09/1976  . Years since quitting: 43.6  Smokeless Tobacco Never Used    Goals Met:  Independence with exercise equipment Exercise tolerated well No report of cardiac concerns or symptoms Strength training completed today  Goals Unmet:  Not Applicable  Comments: checkout time is 1200   Dr. Kathie Dike is Medical Director for [facility].

## 2019-10-21 ENCOUNTER — Other Ambulatory Visit: Payer: Self-pay

## 2019-10-21 ENCOUNTER — Encounter (HOSPITAL_COMMUNITY)
Admission: RE | Admit: 2019-10-21 | Discharge: 2019-10-21 | Disposition: A | Discharge status: Home / Self Care | Payer: Medicare Other | Source: Ambulatory Visit | Attending: Cardiology | Admitting: Cardiology

## 2019-10-21 VITALS — Ht 63.0 in | Wt 173.7 lb

## 2019-10-21 DIAGNOSIS — Z951 Presence of aortocoronary bypass graft: Secondary | ICD-10-CM | POA: Diagnosis not present

## 2019-10-21 NOTE — Progress Notes (Signed)
Daily Session Note  Patient Details  Name: Gabriela Smith MRN: [id number] Date of Birth: September 29, 1940 Referring Provider:     CARDIAC REHAB PHASE II ORIENTATION from 08/29/2019 in [place]  Referring Provider Dr. Percival Spanish      Encounter Date: 10/21/2019  Check In:  Session Check In - 10/21/19 1057      Check-In   Supervising physician immediately available to respond to emergencies See telemetry face sheet for immediately available MD    Location [facility]    Staff Present Algis Downs, Exercise Physiologist;Verlin Uher Kris Mouton, MS, ACSM-CEP, Exercise Physiologist;Debra Wynetta Emery, RN, BSN    Virtual Visit No    Medication changes reported     No    Fall or balance concerns reported    No    Tobacco Cessation No Change    Warm-up and Cool-down Performed as group-led instruction    Resistance Training Performed Yes    VAD Patient? No    PAD/SET Patient? No      Pain Assessment   Currently in Pain? No/denies    Pain Score 0-No pain    Multiple Pain Sites No           Capillary Blood Glucose: No results found for this or any previous visit (from the past 24 hour(s)).    Social History   Tobacco Use  Smoking Status Former Smoker  . Packs/day: 0.50  . Years: 5.00  . Pack years: 2.50  . Types: Cigarettes  . Quit date: 03/09/1976  . Years since quitting: 43.6  Smokeless Tobacco Never Used    Goals Met:  Independence with exercise equipment Exercise tolerated well No report of cardiac concerns or symptoms Strength training completed today  Goals Unmet:  Not Applicable  Comments: checkout time is 1200   Dr. Kathie Dike is Medical Director for [facility].

## 2019-10-24 ENCOUNTER — Encounter (HOSPITAL_COMMUNITY): Payer: Medicare Other

## 2019-10-26 ENCOUNTER — Encounter (HOSPITAL_COMMUNITY): Payer: Medicare Other

## 2019-10-27 NOTE — Progress Notes (Signed)
Discharge Progress Report  Patient Details  Name: Gabriela Smith MRN: [id number] Date of Birth: 24-Dec-1940 Referring Provider:     CARDIAC REHAB PHASE II ORIENTATION from 08/29/2019 in [place]  Referring Provider Dr. Percival Spanish       Number of Visits: 17  Reason for Discharge:  Patient reached a stable level of exercise. Patient independent in their exercise. Patient has met program and personal goals.  Smoking History:  Social History   Tobacco Use  Smoking Status Former Smoker  . Packs/day: 0.50  . Years: 5.00  . Pack years: 2.50  . Types: Cigarettes  . Quit date: 03/09/1976  . Years since quitting: 43.6  Smokeless Tobacco Never Used    Diagnosis:  S/P CABG x 4  ADL UCSD:   Initial Exercise Prescription:  Initial Exercise Prescription - 08/29/19 1200      Date of Initial Exercise RX and Referring Provider   Date 08/29/19    Referring Provider Dr. Percival Spanish    Expected Discharge Date 11/29/19      Treadmill   MPH 1.5    Grade 0    Minutes 17    METs 2.14      T5 Nustep   Level 1    SPM 80    Minutes 22      Prescription Details   Frequency (times per week) 3    Duration Progress to 30 minutes of continuous aerobic without signs/symptoms of physical distress      Intensity   THRR 40-80% of Max Heartrate 56-112    Ratings of Perceived Exertion 11-13      Progression   Progression Continue progressive overload as per policy without signs/symptoms or physical distress.      Resistance Training   Training Prescription Yes    Weight 1    Reps 10-15           Discharge Exercise Prescription (Final Exercise Prescription Changes):  Exercise Prescription Changes - 10/17/19 1200      Response to Exercise   Blood Pressure (Admit) 138/84    Blood Pressure (Exercise) 154/76    Blood Pressure (Exit) 142/70    Heart Rate (Admit) 66 bpm    Heart Rate (Exercise) 83 bpm    Heart Rate (Exit) 75 bpm    Rating of Perceived  Exertion (Exercise) 13    Duration Continue with 30 min of aerobic exercise without signs/symptoms of physical distress.    Intensity THRR unchanged      Progression   Progression Continue to progress workloads to maintain intensity without signs/symptoms of physical distress.      Resistance Training   Training Prescription Yes    Weight 1    Reps 10-15      Treadmill   MPH 1.7    Grade 0    Minutes 17    METs 2.3      T5 Nustep   Level 1    SPM 92    Minutes 22    METs 2.1           Functional Capacity:  6 Minute Walk    Row Name 08/29/19 1158 10/21/19 1440       6 Minute Walk   Phase Initial Discharge    Distance 1200 feet 1100 feet    Distance % Change -- -8.33 %    Distance Feet Change -- -100 ft    Walk Time 6 minutes 6 minutes    # of Rest Breaks  0 0    MPH 2.27 2.08    METS 2.24 1.89    RPE 13 13    VO2 Peak 7.83 6.61    Symptoms No No    Resting HR 62 bpm 73 bpm    Resting BP 170/64 170/68    Resting Oxygen Saturation  95 % 96 %    Exercise Oxygen Saturation  during 6 min walk 2.39 % 95 %    Max Ex. HR 96 bpm 87 bpm    Max Ex. BP 182/76 170/60    2 Minute Post BP 158/64 160/64           Psychological, QOL, Others - Outcomes: PHQ 2/9: Depression screen [facility] 2/9 10/27/2019 08/29/2019 08/12/2019 07/06/2019 02/17/2019  Decreased Interest 0 0 0 0 0  Down, Depressed, Hopeless 0 0 0 0 0  PHQ - 2 Score 0 0 0 0 0  Altered sleeping 0 1 0 1 -  Tired, decreased energy 0 0 0 0 -  Change in appetite 0 0 0 1 -  Feeling bad or failure about yourself  0 0 0 0 -  Trouble concentrating 0 0 0 1 -  Moving slowly or fidgety/restless 0 0 0 1 -  Suicidal thoughts 0 0 0 0 -  PHQ-9 Score 0 1 0 4 -  Difficult doing work/chores - Not difficult at all - Somewhat difficult -  Some recent data might be hidden    Quality of Life:  Quality of Life - 10/21/19 1445      Quality of Life   Select Quality of Life      Quality of Life Scores   Health/Function Pre 27.21 %     Health/Function Post 29.6 %    Health/Function % Change 8.78 %    Socioeconomic Pre 29 %    Socioeconomic Post 30 %    Socioeconomic % Change  3.45 %    Psych/Spiritual Pre 29.14 %    Psych/Spiritual Post 30 %    Psych/Spiritual % Change 2.95 %    Family Pre 28.8 %    Family Post 28.8 %    Family % Change 0 %    GLOBAL Pre 28.48 %    GLOBAL Post 29.64 %    GLOBAL % Change 4.07 %           Personal Goals: Goals established at orientation with interventions provided to work toward goal.  Personal Goals and Risk Factors at Admission - 08/29/19 1252      Core Components/Risk Factors/Patient Goals on Admission    Weight Management Weight Maintenance    Hypertension Yes    Intervention Provide education on lifestyle modifcations including regular physical activity/exercise, weight management, moderate sodium restriction and increased consumption of fresh fruit, vegetables, and low fat dairy, alcohol moderation, and smoking cessation.;Monitor prescription use compliance.    Expected Outcomes Short Term: Continued assessment and intervention until BP is < 140/67m HG in hypertensive participants. < 130/850mHG in hypertensive participants with diabetes, heart failure or chronic kidney disease.;Long Term: Maintenance of blood pressure at goal levels.    Personal Goal Other Yes    Personal Goal Stay healthy; be able to enjoy her grandchildren. Live a healthy life.    Intervention Patient will attend CR 3 days/week and supplement with exercise 2 days/week.    Expected Outcomes Patient will complete the program meeting both program and personal goals.            Personal Goals  Discharge:  Goals and Risk Factor Review    Row Name 09/07/19 1620 10/10/19 1445 10/27/19 0745         Core Components/Risk Factors/Patient Goals Review   Personal Goals Review Weight Management/Obesity  Stay healthy; be able to enjoy her grandchildren; live a healthy life. Weight Management/Obesity  Stay  healthy; be able to enjoy her grandchildren/live a healthy life. Weight Management/Obesity  Be able to enjoy her grandchildren; live a healthy life.     Review Patient is new to the program completing 5 sessions maintaining her weight since her initial visit. She says she does feels stronger and better with only 5 session. Will continue to monitor. Patient has completed 13 sessions losing 1 lb since her last 30 day review. She is doing well in the program with progression and consistent attendance. She says she feels like the program helps her relax and she enjoys coming. She says she feels better both physically and mentally. Will continue to monitor. Patient requested to graduate early due to having to stay with her husband who has Parkinson's disease. She completed 17 sessions and did all exit measurements and documentation. Her exit measurements improved in grip strength. Her exit walk test did not improve. Her diet medficts score improved by 200% so she is eating a lot healthier. She lost 1 lb bue she did lose 6 inches in her hips and 8 in her waist. She says she does feels stronger and has more energy and feels better overall and learned a lot about eating healthy and heart disease. She hopes to contine exercising by walking at home. She feels she met her personal goals and she met program goals. CR will f/u for oner year.     Expected Outcomes Patient will continue to attend sessions and complete the program meeting both program and personal goals. Patient will continue to attend sessions and complete the program meeting both program and personal goals. Patient will continue exercising by walking at home and continue to meet her personal goals.            Exercise Goals and Review:  Exercise Goals    Row Name 08/29/19 1205 10/10/19 1425           Exercise Goals   Increase Physical Activity Yes Yes      Intervention Develop an individualized exercise prescription for aerobic and resistive  training based on initial evaluation findings, risk stratification, comorbidities and participant's personal goals.;Provide advice, education, support and counseling about physical activity/exercise needs. Provide advice, education, support and counseling about physical activity/exercise needs.;Develop an individualized exercise prescription for aerobic and resistive training based on initial evaluation findings, risk stratification, comorbidities and participant's personal goals.      Expected Outcomes Short Term: Attend rehab on a regular basis to increase amount of physical activity.;Long Term: Add in home exercise to make exercise part of routine and to increase amount of physical activity.;Long Term: Exercising regularly at least 3-5 days a week. Short Term: Attend rehab on a regular basis to increase amount of physical activity.;Long Term: Add in home exercise to make exercise part of routine and to increase amount of physical activity.;Long Term: Exercising regularly at least 3-5 days a week.      Increase Strength and Stamina Yes Yes      Intervention Provide advice, education, support and counseling about physical activity/exercise needs.;Develop an individualized exercise prescription for aerobic and resistive training based on initial evaluation findings, risk stratification, comorbidities and participant's personal goals. Provide  advice, education, support and counseling about physical activity/exercise needs.;Develop an individualized exercise prescription for aerobic and resistive training based on initial evaluation findings, risk stratification, comorbidities and participant's personal goals.      Expected Outcomes Short Term: Increase workloads from initial exercise prescription for resistance, speed, and METs.;Short Term: Perform resistance training exercises routinely during rehab and add in resistance training at home;Long Term: Improve cardiorespiratory fitness, muscular endurance and  strength as measured by increased METs and functional capacity (6MWT) Short Term: Increase workloads from initial exercise prescription for resistance, speed, and METs.;Short Term: Perform resistance training exercises routinely during rehab and add in resistance training at home;Long Term: Improve cardiorespiratory fitness, muscular endurance and strength as measured by increased METs and functional capacity (6MWT)      Able to understand and use rate of perceived exertion (RPE) scale Yes Yes      Intervention Provide education and explanation on how to use RPE scale Provide education and explanation on how to use RPE scale      Expected Outcomes Short Term: Able to use RPE daily in rehab to express subjective intensity level;Long Term:  Able to use RPE to guide intensity level when exercising independently Short Term: Able to use RPE daily in rehab to express subjective intensity level;Long Term:  Able to use RPE to guide intensity level when exercising independently      Knowledge and understanding of Target Heart Rate Range (THRR) Yes Yes      Intervention Provide education and explanation of THRR including how the numbers were predicted and where they are located for reference Provide education and explanation of THRR including how the numbers were predicted and where they are located for reference      Expected Outcomes Short Term: Able to state/look up THRR;Long Term: Able to use THRR to govern intensity when exercising independently;Short Term: Able to use daily as guideline for intensity in rehab Short Term: Able to state/look up THRR;Long Term: Able to use THRR to govern intensity when exercising independently;Short Term: Able to use daily as guideline for intensity in rehab      Able to check pulse independently Yes Yes      Intervention Provide education and demonstration on how to check pulse in carotid and radial arteries.;Review the importance of being able to check your own pulse for safety  during independent exercise Provide education and demonstration on how to check pulse in carotid and radial arteries.;Review the importance of being able to check your own pulse for safety during independent exercise      Expected Outcomes Short Term: Able to explain why pulse checking is important during independent exercise;Long Term: Able to check pulse independently and accurately Short Term: Able to explain why pulse checking is important during independent exercise;Long Term: Able to check pulse independently and accurately      Understanding of Exercise Prescription Yes Yes      Intervention Provide education, explanation, and written materials on patient's individual exercise prescription Provide education, explanation, and written materials on patient's individual exercise prescription      Expected Outcomes Short Term: Able to explain program exercise prescription;Long Term: Able to explain home exercise prescription to exercise independently Short Term: Able to explain program exercise prescription;Long Term: Able to explain home exercise prescription to exercise independently             Exercise Goals Re-Evaluation:  Exercise Goals Re-Evaluation    Row Name 09/09/19 1414 10/10/19 1426  Exercise Goal Re-Evaluation   Exercise Goals Review Increase Physical Activity;Increase Strength and Stamina Increase Physical Activity;Increase Strength and Stamina;Able to understand and use rate of perceived exertion (RPE) scale;Knowledge and understanding of Target Heart Rate Range (THRR);Able to check pulse independently;Understanding of Exercise Prescription      Comments She has only done six session so far in the program. She said that she can tell that she has more energy. She is motivated to meet her goals and has a positve attitude. Her Systolic BP tend to run above 180 before sessions. Her doctor was notified about this upon entry to program. We will continue to monitor high BP and  progress as tolerated. Patient said that she is able to play more with her grandchildren. 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 She has recently trying to cut out caffeine and is focusing on lowering her blood pressure.      Expected Outcomes To reach expected outcomes To reach expected outcomes             Nutrition & Weight - Outcomes:  Pre Biometrics - 08/29/19 1206      Pre Biometrics   Height _0  (1.6 m)    Weight 80 kg    Waist Circumference 41 inches    Hip Circumference 47 inches    Waist to Hip Ratio 0.87 %    BMI (Calculated) 31.25    Triceps Skinfold 26 mm    % Body Fat 43.7 %    Grip Strength 26 kg    Flexibility 0 in    Single Leg Stand 3.16 seconds           Post Biometrics - 10/21/19 1443       Post  Biometrics   Height _1  (1.6 m)    Weight 78.8 kg    Waist Circumference 37 inches    Hip Circumference 41 inches    Waist to Hip Ratio 0.9 %    BMI (Calculated) 30.78    Triceps Skinfold 25 mm    % Body Fat 43.5 %    Grip Strength 26.7 kg    Flexibility 0 in    Single Leg Stand 4.32 seconds           Nutrition:  Nutrition Therapy & Goals - 10/10/19 1442      Personal Nutrition Goals   Comments Patient says she still follows a low sodium diet and has recently started reducing her caffeine intake. She feels she is meeting her nutritional goals. Will continue to monitor.      Intervention Plan   Intervention Nutrition handout(s) given to patient.           Nutrition Discharge:  Nutrition Assessments - 10/27/19 0739      MEDFICTS Scores   Pre Score 21    Post Score 7    Score Difference -14           Education Questionnaire Score:  Knowledge Questionnaire Score - 10/27/19 0739      Knowledge Questionnaire Score   Pre Score 22/24    Post Score 21/24         Patient graduated from Cardiac Rehabilitation today on 10/21/19 after completing 17 sessions. She achieved LTG of 30 minutes of  aerobic exercise at Max Met level of 2.2. All patients vitals are WNL.  Discharge instruction has been reviewed in detail and patient stated an understanding of material given. Patient plans  to exercise at home by walking. Cardiac Rehab staff will make f/u calls at 1 month, 6 months, and 1 year. Patient had no complaints of any abnormal S/S or pain on their exit visit.   Goals reviewed with patient; copy given to patient.

## 2019-10-28 ENCOUNTER — Encounter (HOSPITAL_COMMUNITY): Payer: Medicare Other

## 2019-10-31 ENCOUNTER — Encounter (HOSPITAL_COMMUNITY): Payer: Medicare Other

## 2019-11-02 ENCOUNTER — Encounter (HOSPITAL_COMMUNITY): Payer: Medicare Other

## 2019-11-04 ENCOUNTER — Encounter (HOSPITAL_COMMUNITY): Payer: Medicare Other

## 2019-11-07 ENCOUNTER — Encounter (HOSPITAL_COMMUNITY): Payer: Medicare Other

## 2019-11-09 ENCOUNTER — Ambulatory Visit: Payer: Medicare Other | Admitting: Licensed Clinical Social Worker

## 2019-11-09 DIAGNOSIS — M5136 Other intervertebral disc degeneration, lumbar region: Secondary | ICD-10-CM

## 2019-11-09 DIAGNOSIS — E785 Hyperlipidemia, unspecified: Secondary | ICD-10-CM

## 2019-11-09 DIAGNOSIS — Z951 Presence of aortocoronary bypass graft: Secondary | ICD-10-CM

## 2019-11-09 DIAGNOSIS — M51369 Other intervertebral disc degeneration, lumbar region without mention of lumbar back pain or lower extremity pain: Secondary | ICD-10-CM

## 2019-11-09 DIAGNOSIS — I10 Essential (primary) hypertension: Secondary | ICD-10-CM

## 2019-11-09 DIAGNOSIS — I714 Abdominal aortic aneurysm, without rupture, unspecified: Secondary | ICD-10-CM

## 2019-11-09 DIAGNOSIS — I5031 Acute diastolic (congestive) heart failure: Secondary | ICD-10-CM

## 2019-11-09 DIAGNOSIS — E559 Vitamin D deficiency, unspecified: Secondary | ICD-10-CM

## 2019-11-09 NOTE — Patient Instructions (Addendum)
Licensed Clinical Social Worker Visit Information  Goals we discussed today:   .  Client will talk with LCSW in next 30 days to discuss client completion of ADLs and dialy tasks (pt-stated)        CARE PLAN ENTRY   Current Barriers:   Mobility challenges in client with Chronic Diagnoses of CHF, HLD, HTN, OA, DDD, Vitamin D deficiency, AAA, s/p CABG X 4  Short of breath on occasion   Clinical Social Work Clinical Goal(s):   LCSW to call client in next 30 days to tidsucs client completion of ADLs and daily activities  Interventions:  Talked with client about CCM program support  Talked with client about her mobility challenges  Talked with client about pain issues  Taked with clinet about her breathing challenges (occasional short of breath)  Talked with client about her heart surgery (heart surgery in February of 2021)  Talked with client aobut her upcoming client appointments  Talked with Gabriela Smith about relaxation techniques of choice Gabriela Smith likes to [employer] , she likes to listen to music, and likes to read)  Talked with Gabriela Smith about her family history of heart issues  Talked with Gabriela Smith about energy level (she feels that her energy has improved lately)  Talked with Gabriela Smith about physical therapy sessions she recently completed   Talked with client about vision issues of client  Talked with Gabriela Smith about family support network  Talked with Gabriela Smith about transport needs  Talked with client about sleeping issues of client  Talked with client about social support network (daughter is supportive)  Encouraged client to call Dr. Lottie Smith, pharmacist at [facility] to discuss medication costs and medication questions with Dr. Blanca Smith   Patient Self Care Activities:  Takes medications as prescribed Attends scheduled medical appointments   Patient Self Care Deficits:   Mobility challenges  Shortness of breath occasionally   Initial goal documentation    Follow Up  Plan: LCSW to call client in next 4 weeks to talk with her about her completion of daily ADLs and daily activities  Materials Provided: No  The patient verbalized understanding of instructions provided today and declined a print copy of patient instruction materials.   Gabriela Smith.Gabriela Smith MSW, LCSW Licensed Clinical Social Worker [facility] [phone]

## 2019-11-09 NOTE — Chronic Care Management (AMB) (Signed)
Chronic Care Management    Clinical Social Work Follow Up Note  11/09/2019 Name: Gabriela Smith MRN: [id number] DOB: [birth year]  Gabriela Smith is a 79 y.o. year old female who is a primary care patient of [name], DO. The CCM team was consulted for assistance with [employer] .   Review of patient status, including review of consultants reports, other relevant assessments, and collaboration with appropriate care team members and the patient's provider was performed as part of comprehensive patient evaluation and provision of chronic care management services.    SDOH (Social Determinants of Health) assessments performed: No;risk for tobacco use; risk for depression; risk for stress; risk for physical inactivity    CARDIAC REHAB PHASE II EXERCISE from 10/21/2019 in [place]  PHQ-9 Total Score 0       GAD 7 : Generalized Anxiety Score 07/06/2019  Nervous, Anxious, on Edge 0  Control/stop worrying 0  Worry too much - different things 0  Trouble relaxing 0  Restless 1  Easily annoyed or irritable 0  Afraid - awful might happen 0  Total GAD 7 Score 1  Anxiety Difficulty Not difficult at all    Outpatient Encounter Medications as of 11/09/2019  Medication Sig  . acetaminophen (TYLENOL) 500 MG tablet Take 2 tablets (1,000 mg total) by mouth every 8 (eight) hours.  [name] amLODipine (NORVASC) 5 MG tablet Take 1 tablet (5 mg total) by mouth daily.  [name] apixaban (ELIQUIS) 5 MG TABS tablet Take 1 tablet (5 mg total) by mouth 2 (two) times daily.  [name] aspirin EC 81 MG EC tablet Take 1 tablet (81 mg total) by mouth daily.  . cholecalciferol (VITAMIN D) 1000 UNITS tablet Take 1,000 Units by mouth daily after breakfast.   . Co-Enzyme Q10 200 MG CAPS Take 200 mg by mouth daily after breakfast.   . digoxin (LANOXIN) 0.125 MG tablet Take 1 tablet (0.125 mg total) by mouth daily.  . Evolocumab (REPATHA SURECLICK) 035 MG/ML SOAJ Inject 1 Dose into the skin every 14  (fourteen) days.  . Flaxseed, Linseed, (FLAX SEED OIL) 1000 MG CAPS Take 1,000 mg by mouth daily after breakfast.   . gabapentin (NEURONTIN) 100 MG capsule Take 1 capsule (100 mg total) by mouth at bedtime.  [name] levothyroxine (SYNTHROID) 50 MCG tablet Take 1 tablet (50 mcg total) by mouth daily.  . Magnesium 300 MG CAPS Take 300 mg by mouth at bedtime.   . metoprolol tartrate (LOPRESSOR) 50 MG tablet TAKE (1) TABLET TWICE A DAY.  . nitroGLYCERIN (NITROSTAT) 0.4 MG SL tablet PLACE 1 TABLET UNDER THE TONGUE AT ONSET OF CHEST PAIN EVERY 5 MINTUES UP TO 3 TIMES AS NEEDED  . traMADol (ULTRAM) 50 MG tablet Take 50 mg by mouth every 4 (four) hours as needed for moderate pain.  . TURMERIC PO Take 1 capsule by mouth daily after breakfast.  . vitamin B-12 (CYANOCOBALAMIN) 500 MCG tablet Take 1 tablet (500 mcg total) by mouth daily.   Facility-Administered Encounter Medications as of 11/09/2019  Medication  . DOBUTamine (DOBUTREX) 1,000 mcg/mL in dextrose 5% 250 mL infusion     Goals    .  Client will talk with LCSW in next 30 days to discuss client completion of ADLs and dialy tasks (pt-stated)      CARE PLAN ENTRY   Current Barriers:  [name] Mobility challenges in client with Chronic Diagnoses of CHF, HLD, HTN, OA, DDD, Vitamin D deficiency, AAA, s/p CABG X 4 .  Short of breath on occasion   Clinical Social Work Clinical Goal(s):  [name] LCSW to call client in next 30 days to tidsucs client completion of ADLs and daily activities  Interventions:  Talked with client about CCM program support  Talked with client about her mobility challenges  Talked with client about pain issues  Taked with clinet about her breathing challenges (occasional short of breath)  Talked with client about her heart surgery (heart surgery in February of 2021)  Talked with client aobut her upcoming client appointments  Talked with Letta Median about relaxation techniques of choice [place] likes to [employer] , she likes to listen to music,  and likes to read)  Talked with Evone about her family history of heart issues  Talked with Letta Median about energy level (she feels that her energy has improved lately)  Talked with Letta Median about physical therapy sessions she recently completed   Talked with client about vision issues of client  Talked with Yalena about family support network  Talked with Letta Median about transport needs  Talked with client about sleeping issues of client  Talked with client about social support network (daughter is supportive)  Encouraged client to call Dr. Lottie Dawson, pharmacist at [facility] to discuss medication costs and medication questions with Dr. Blanca Friend   Patient Self Care Activities:  Takes medications as prescribed Attends scheduled medical appointments  . Patient Self Care Deficits:  [name] Mobility challenges . Shortness of breath occasionally   Initial goal documentation    Follow Up Plan: LCSW to call client in next 4 weeks to talk with her about her completion of daily ADLs and daily activities  [name].Javae Braaten MSW, LCSW Licensed Clinical Social Worker [facility] [phone]

## 2019-11-16 ENCOUNTER — Other Ambulatory Visit: Payer: Self-pay | Admitting: Pharmacist

## 2019-11-16 ENCOUNTER — Other Ambulatory Visit: Payer: Self-pay | Admitting: Physician Assistant

## 2019-11-16 DIAGNOSIS — I480 Paroxysmal atrial fibrillation: Secondary | ICD-10-CM

## 2019-11-16 MED ORDER — APIXABAN 5 MG PO TABS: 5.0000 mg | ORAL_TABLET | Freq: Two times a day (BID) | ORAL | 6 refills | Status: DC

## 2019-11-16 NOTE — Telephone Encounter (Signed)
Prescription refill request for Eliquis received. Indication: PAF Last office visit:10/12/19 Scr: 0.81 Age: 79 Weight: 78 kg

## 2019-11-23 ENCOUNTER — Telehealth: Payer: Medicare Other

## 2019-12-05 ENCOUNTER — Ambulatory Visit (INDEPENDENT_AMBULATORY_CARE_PROVIDER_SITE_OTHER): Payer: Medicare Other | Admitting: *Deleted

## 2019-12-05 DIAGNOSIS — Z0000 Encounter for general adult medical examination without abnormal findings: Secondary | ICD-10-CM | POA: Diagnosis not present

## 2019-12-05 NOTE — Progress Notes (Addendum)
MEDICARE ANNUAL WELLNESS VISIT  12/05/2019  Telephone Visit Disclaimer This Medicare AWV was conducted by telephone due to national recommendations for restrictions regarding the COVID-19 Pandemic (e.g. social distancing).  I verified, using two identifiers, that I am speaking with Gabriela Smith or their authorized healthcare agent. I discussed the limitations, risks, security, and privacy concerns of performing an evaluation and management service by telephone and the potential availability of an in-person appointment in the future. The patient expressed understanding and agreed to proceed.  Location of Patient: home  Location of Provider (nurse):office  Subjective:    Gabriela Smith is a 79 y.o. female patient of Gabriela Norlander, DO who had a Medicare Annual Wellness Visit today via telephone. Gabriela Smith is Retired and lives with their spouse. she has 3 children. she reports that she is socially active and does interact with friends/family regularly. she is minimally physically active and enjoys reading.  Patient Care Team: Gabriela Norlander, DO as PCP - General (Family Medicine) Minus Breeding, MD as PCP - Cardiology (Cardiology) Kathie Rhodes, MD as Consulting Physician (Urology) Katy Apo, MD as Consulting Physician (Ophthalmology) Shea Evans, [name], LCSW as [place] [employer] (Licensed Clinical Social Worker) Cori Razor, Delice Bison, RN as Registered Nurse Lajuana Matte, MD as Consulting Physician (Cardiothoracic Surgery)  Advanced Directives 12/05/2019 04/17/2019 04/16/2019 04/15/2019 12/02/2018 12/01/2017 12/25/2015  Does Patient Have a Medical Advance Directive? Yes - Yes Yes Yes Yes Yes  Type of Paramedic of [place];Living will Living will - - [place];Living will [place];Living will [place];Living will  Does patient want to make changes to medical advance directive?  No - Patient declined No - Patient declined No - Patient declined - No - Patient declined No - Patient declined No - Patient declined  Copy of [place] in Chart? No - copy requested - - - No - copy requested No - copy requested No - copy requested  Pre-existing out of facility DNR order (yellow form or pink MOST form) - - - - - - -    Hospital Utilization Over the Past 12 Months: # of hospitalizations or ER visits: 1 # of surgeries: 1  Review of Systems    Patient reports that her overall health is better compared to last year.  History obtained from chart review and patient  Patient Reported Readings (BP, Pulse, CBG, Weight, etc) none  Pain Assessment Pain : No/denies pain     Current Medications & Allergies (verified) Allergies as of 12/05/2019       Reactions   Fish Allergy Hives, Shortness Of Breath, Other (See Comments)   "Bottom-feeder fish"   Fish-derived Products Hives, Shortness Of Breath, Other (See Comments)   Bottom-feeder fish   Shellfish-derived Products Hives, Shortness Of Breath   Lasix [furosemide] Swelling, Other (See Comments)   Edema, elevated BP   Macrodantin [nitrofurantoin Macrocrystal] Other (See Comments)   Unknown reaction    Vicodin [hydrocodone-acetaminophen] Nausea And Vomiting, Other (See Comments)   SEVERE N/V   Zetia [ezetimibe] Other (See Comments)   Muscle aches   Codeine Rash   Latex Itching, Rash   Reports Elastic in underwear, under wire bras, and now surgical cap cause rash and itching.    Niacin Other (See Comments)   Muscle aches   Niaspan [niacin Er] Other (See Comments)   Muscle aches   Statins Other (See Comments)   Muscle aches/ cramps  Sulfa Antibiotics Rash, Other (See Comments)   Muscle aches/ cramps, also        Medication List        Accurate as of December 05, 2019 10:43 AM. If you have any questions, ask your nurse or doctor.          acetaminophen 500 MG tablet Commonly known as:  TYLENOL Take 2 tablets (1,000 mg total) by mouth every 8 (eight) hours.   amLODipine 5 MG tablet Commonly known as: NORVASC Take 1 tablet (5 mg total) by mouth daily.   apixaban 5 MG Tabs tablet Commonly known as: ELIQUIS Take 1 tablet (5 mg total) by mouth 2 (two) times daily.   aspirin 81 MG EC tablet Take 1 tablet (81 mg total) by mouth daily.   cholecalciferol 1000 units tablet Commonly known as: VITAMIN D Take 1,000 Units by mouth daily after breakfast.   Co-Enzyme Q10 200 MG Caps Take 200 mg by mouth daily after breakfast.   digoxin 0.125 MG tablet Commonly known as: LANOXIN Take 1 tablet (0.125 mg total) by mouth daily.   Flax Seed Oil 1000 MG Caps Take 1,000 mg by mouth daily after breakfast.   gabapentin 100 MG capsule Commonly known as: NEURONTIN Take 1 capsule (100 mg total) by mouth at bedtime.   levothyroxine 50 MCG tablet Commonly known as: SYNTHROID Take 1 tablet (50 mcg total) by mouth daily.   Magnesium 300 MG Caps Take 300 mg by mouth at bedtime.   metoprolol tartrate 50 MG tablet Commonly known as: LOPRESSOR TAKE (1) TABLET TWICE A DAY.   nitroGLYCERIN 0.4 MG SL tablet Commonly known as: NITROSTAT PLACE 1 TABLET UNDER THE TONGUE AT ONSET OF CHEST PAIN EVERY 5 MINTUES UP TO 3 TIMES AS NEEDED   Repatha SureClick 299 MG/ML Soaj Generic drug: Evolocumab Inject 1 Dose into the skin every 14 (fourteen) days.   traMADol 50 MG tablet Commonly known as: ULTRAM Take 50 mg by mouth every 4 (four) hours as needed for moderate pain.   TURMERIC PO Take 1 capsule by mouth daily after breakfast.   vitamin B-12 500 MCG tablet Commonly known as: CYANOCOBALAMIN Take 1 tablet (500 mcg total) by mouth daily.        History (reviewed): Past Medical History:  Diagnosis Date   AAA (abdominal aortic aneurysm) [facility])    cardiology aware    Arthritis    OA lt knee- cortizone inj. q 4 months AND OA ALSO IN BACK. Had knee replaced-issue resolved   CAD  (coronary artery disease) CARDIOLOGIST- [facility]--  VISIT 01-02-10 IN EPIC   1993-- PTCA. Pt describes a near total blockage of apparently the LAD and a 65% stenosis elsewhere.   Cancer [facility])    History of bladder cancer followed by dr Karsten Ro   hx  TCC of bladder ,  Ta G3-  first occurence 02-20-2012--  s/p BCG tx's  and  mitomycin C   Hyperlipidemia 1993   Hypertension 1993   Nocturia    Osteopenia    Sigmoid diverticulosis    Past Surgical History:  Procedure Laterality Date   CHOLECYSTECTOMY  2003 (approx)   CLIPPING OF ATRIAL APPENDAGE N/A 04/25/2019   Procedure: Clipping Of Atrial Appendage;  Surgeon: Lajuana Matte, MD;  Location: [place];  Service: Open Heart Surgery;  Laterality: N/A;   COLONOSCOPY  05-31-2003   CORONARY ANGIOPLASTY  1993   to LAD   CORONARY ARTERY BYPASS GRAFT N/A 04/25/2019   Procedure: CORONARY ARTERY  BYPASS GRAFTING (CABG) times four using left internal mammary artery and left greater saphenous vein harvested endoscipically.;  Surgeon: Lajuana Matte, MD;  Location: [place];  Service: Open Heart Surgery;  Laterality: N/A;   CYSTOSCOPY WITH BIOPSY  05/09/2011   Procedure: CYSTOSCOPY WITH BIOPSY;  Surgeon: Claybon Jabs, MD;  Location: [facility];  Service: Urology;  Laterality: N/A;  WITH BLADDER BIOPSY GYRUS   LEFT HEART CATH AND CORONARY ANGIOGRAPHY N/A 04/19/2019   Procedure: LEFT HEART CATH AND CORONARY ANGIOGRAPHY;  Surgeon: Leonie Man, MD;  Location: [place] [facility];  Service: Cardiovascular;  Laterality: N/A;   TOTAL HIP ARTHROPLASTY Right 12/25/2015   Procedure: RIGHT TOTAL HIP ARTHROPLASTY ANTERIOR APPROACH;  Surgeon: Paralee Cancel, MD;  Location: [facility];  Service: Orthopedics;  Laterality: Right;   TOTAL KNEE ARTHROPLASTY Left 11/22/2012   Procedure: LEFT TOTAL KNEE ARTHROPLASTY;  Surgeon: Mauri Pole, MD;  Location: [facility];  Service: Orthopedics;  Laterality: Left;   TRANSURETHRAL RESECTION OF BLADDER TUMOR   03/14/2011   Procedure: TRANSURETHRAL RESECTION OF BLADDER TUMOR (TURBT);  Surgeon: Claybon Jabs, MD;  Location: [facility];  Service: Urology;  Laterality: N/A;   TRANSURETHRAL RESECTION OF BLADDER TUMOR  05/09/2011   Procedure: TRANSURETHRAL RESECTION OF BLADDER TUMOR (TURBT);  Surgeon: Claybon Jabs, MD;  Location[facility];  Service: Urology;  Laterality: N/A;   TRANSURETHRAL RESECTION OF BLADDER TUMOR  03/08/2012   Procedure: TRANSURETHRAL RESECTION OF BLADDER TUMOR (TURBT);  Surgeon: Claybon Jabs, MD;  Location: [facility];  Service: Urology;  Laterality: N/A;   TRANSURETHRAL RESECTION OF BLADDER TUMOR WITH GYRUS (TURBT-GYRUS) N/A 03/24/2014   Procedure: TRANSURETHRAL RESECTION OF BLADDER TUMOR WITH GYRUS (TURBT-GYRUS);  Surgeon: Claybon Jabs, MD;  Location: [facility];  Service: Urology;  Laterality: N/A;   UMBILICAL HERNIA REPAIR  2001  (approx)   Family History  Problem Relation Age of Onset   Coronary artery disease Father 51   Hyperlipidemia Father    Heart attack Mother 61   Hyperlipidemia Mother    Osteoporosis Sister    Hyperlipidemia Sister    Heart attack Daughter 44       CABG x 5   Heart disease Son 54       Stents x 3   Social History   Socioeconomic History   Marital status: Married    Spouse name: Not on file   Number of children: 3   Years of education: 12   Highest education level: High school graduate  Occupational History   Occupation: retired  Tobacco Use   Smoking status: Former Smoker    Packs/day: 0.50    Years: 5.00    Pack years: 2.50    Types: Cigarettes    Quit date: 03/09/1976    Years since quitting: 43.7   Smokeless tobacco: Never Used  Vaping Use   Vaping Use: Never used  Substance and Sexual Activity   Alcohol use: Not Currently    Comment: red wine   Drug use: No   Sexual activity: Not Currently  Other Topics Concern   Not on file  Social History Narrative    Married with 3 children and 4 grandchildren.  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 with Parkinsons   Social Determinants of Health   Financial Resource Strain: Low Risk    Difficulty of Paying Living Expenses: Not hard at all  Food Insecurity: No Food Insecurity   Worried About  Running Out of Food in the Last Year: Never true   Ran Out of Food in the Last Year: Never true  Transportation Needs: No Transportation Needs   Lack of Transportation (Medical): No   Lack of Transportation (Non-Medical): No  Physical Activity: Insufficiently Active   Days of Exercise per Week: 3 days   Minutes of Exercise per Session: 30 min  Stress: No Stress Concern Present   Feeling of Stress : Not at all  Social Connections: Socially Integrated   Frequency of Communication with Friends and Family: More than three times a week   Frequency of Social Gatherings with Friends and Family: More than three times a week   Attends Religious Services: More than 4 times per year   Active Member of [employer] or Organizations: Yes   Attends Archivist Meetings: 1 to 4 times per year   Marital Status: Married    Activities of Daily Living In your present state of health, do you have any difficulty performing the following activities: 12/05/2019 04/16/2019  Hearing? - N  Vision? - N  Difficulty concentrating or making decisions? - N  Walking or climbing stairs? - N  Dressing or bathing? - N  Doing errands, shopping? - N  Conservation officer, nature and eating ? N -  Using the Toilet? N -  In the past six months, have you accidently leaked urine? N -  Do you have problems with loss of bowel control? N -  Managing your Medications? N -  Managing your Finances? N -  Housekeeping or managing your Housekeeping? N -  Some recent data might be hidden    Patient Education/ Literacy How often do you need to have someone help you when you read instructions, pamphlets, or other written materials from your doctor or pharmacy?: 1 - Never What is  the last grade level you completed in school?: 12  Exercise Current Exercise Habits: Home exercise routine, Type of exercise: walking, Time (Minutes): 30, Frequency (Times/Week): 3, Weekly Exercise (Minutes/Week): 90, Intensity: Mild, Exercise limited by: cardiac condition(s)  Diet Patient reports consuming 2 meals a day and 1 snack(s) a day Patient reports that her primary diet is: low fat Patient reports that she does have regular access to food.   Depression Screen PHQ 2/9 Scores 12/05/2019 10/27/2019 08/29/2019 08/12/2019 07/06/2019 02/17/2019 12/02/2018  PHQ - 2 Score 0 0 0 0 0 0 0  PHQ- 9 Score - 0 1 0 4 - -     Fall Risk Fall Risk  12/05/2019 08/29/2019 08/12/2019 02/17/2019 12/02/2018  Falls in the past year? 0 0 0 0 0  Number falls in past yr: - - - - 0  Injury with Fall? - - - - 0  Risk for fall due to : - No Fall Risks - - -  Follow up - Falls evaluation completed - - Falls prevention discussed  Comment - - - - No throw rugs in the house, adequate lighting in the walkways and grab bars in the bathroom.     Objective:  Gabriela Smith seemed alert and oriented and she participated appropriately during our telephone visit.  Blood Pressure Weight BMI  BP Readings from Last 3 Encounters:  10/12/19 (!) 182/76  09/29/19 (!) 146/63  08/29/19 (!) 170/64   Wt Readings from Last 3 Encounters:  10/21/19 173 lb 11.6 oz (78.8 kg)  10/17/19 173 lb 11.6 oz (78.8 kg)  10/12/19 173 lb (78.5 kg)   BMI Readings from Last 1 Encounters:  10/21/19 30.77 kg/m    *Unable to obtain current vital signs, weight, and BMI due to telephone visit type  Hearing/Vision  Letta Median did not seem to have difficulty with hearing/understanding during the telephone conversation Reports that she has not had a formal eye exam by an eye care professional within the past year Reports that she has not had a formal hearing evaluation within the past year *Unable to fully assess hearing and vision during telephone visit  type  Cognitive Function: 6CIT Screen 12/05/2019 12/02/2018  What Year? 0 points 0 points  What month? 0 points 0 points  What time? 0 points 0 points  Count back from 20 0 points 0 points  Months in reverse 0 points 0 points  Repeat phrase 0 points 2 points  Total Score 0 2   (Normal:0-7, Significant for Dysfunction: >8)  Normal Cognitive Function Screening: Yes   Immunization & Health Maintenance Record Immunization History  Administered Date(s) Administered   Fluad Quad(high Dose 65+) 11/17/2018   Influenza, High Dose Seasonal PF 12/07/2015, 12/16/2016, 12/21/2017   Influenza,inj,Quad PF,6+ Mos 12/21/2012, 01/04/2014, 12/25/2014   PFIZER SARS-COV-2 Vaccination 03/30/2019, 06/25/2019   Pneumococcal Conjugate-13 06/28/2014   Pneumococcal Polysaccharide-23 11/04/2012   Tdap 10/09/2010   Zoster 07/15/2006    Health Maintenance  Topic Date Due   DEXA SCAN  08/20/2019   INFLUENZA VACCINE  10/09/2019   TETANUS/TDAP  10/08/2020   COVID-19 Vaccine  Completed   PNA vac Low Risk Adult  Completed   MAMMOGRAM  Discontinued       Assessment  This is a routine wellness examination for Gabriela Smith.  Health Maintenance: Due or Overdue Health Maintenance Due  Topic Date Due   DEXA SCAN  08/20/2019   INFLUENZA VACCINE  10/09/2019    Gabriela Smith does not need a referral for Community Assistance: Care Management:   no Social Work:    no Prescription Assistance:  no Nutrition/Diabetes Education:  no   Plan:  Personalized Goals Goals Addressed             This Visit's Progress    DIET - INCREASE WATER INTAKE   On track    Try to drink 6-8 glasses of water daily.     DIET - REDUCE SUGAR INTAKE   On track    Patient states she would like to lose 10 lbs and plans to do this by decreasing her carbohydrate intake.        Personalized Health Maintenance & Screening Recommendations  Influenza vaccine Bone densitometry screening shingrix  Lung Cancer Screening  Recommended: no (Low Dose CT Chest recommended if Age 52-80 years, 30 pack-year currently smoking OR have quit w/in past 15 years) Hepatitis C Screening recommended: no HIV Screening recommended: no  Advanced Directives: Written information was not prepared per patient's request.  Referrals & Orders No orders of the defined types were placed in this encounter.   Follow-up Plan Follow-up with Gabriela Norlander, DO as planned on 12/09/19 Pt will get flu vaccine on 12/09/19 and will discuss shingles vaccine. Pt is due Dexa Pt has eye exam appt in Oct 2021 Pt has a hearing exam in Oct. 2021 Pt had open heart surgery in Feb 2021, she is now doing very well and completely independent.  She cares for her husband who has parkinson's. Pt does have advanced directives, copy requested for our records. AVS printed and saved for pt to pick up on 12/09/19.      I have personally  reviewed and noted the following in the patient's chart:   Medical and social history Use of alcohol, tobacco or illicit drugs  Current medications and supplements Functional ability and status Nutritional status Physical activity Advanced directives List of other physicians Hospitalizations, surgeries, and ER visits in previous 12 months Vitals Screenings to include cognitive, depression, and falls Referrals and appointments  In addition, I have reviewed and discussed with Gabriela Smith certain preventive protocols, quality metrics, and best practice recommendations. A written personalized care plan for preventive services as well as general preventive health recommendations is available and can be mailed to the patient at her request.      Rana Snare, LPN  3/89/3734   I have reviewed and agree with the above AWV documentation.   [name], FNP

## 2019-12-08 NOTE — Progress Notes (Signed)
Subjective: Gabriela Smith TSH PCP: [name], DO SHF:W[facility] Gabriela Smith is a 79 y.o. female presenting to clinic today for:  1. Elevated TSH Noted to have a TSH that was elevated over the summer.  She was started on Synthroid 50 mcg daily.  At first she was not sure if she was taking this but after speaking to her pharmacist it appears that this was filled and picked up.  She does not take it separately from her other medications.  She does not report any change in voice, change in bowel habits, change in weight, heart palpitations or tremor.  2.  Decreased hearing Patient reports decreased hearing on the right ear.  She notes that this has been going on for a while now and people frequently tell her that she needs to get her hearing checked.  She wonders if she has wax buildup.  Denies any pain or imbalance.   ROS: Per HPI  Allergies  Allergen Reactions  . Fish Allergy Hives, Shortness Of Breath and Other (See Comments)    "Bottom-feeder fish"  . Fish-Derived Products Hives, Shortness Of Breath and Other (See Comments)    Bottom-feeder fish  . Shellfish-Derived Products Hives and Shortness Of Breath  . Lasix [Furosemide] Swelling and Other (See Comments)    Edema, elevated BP  . Macrodantin [Nitrofurantoin Macrocrystal] Other (See Comments)    Unknown reaction   . Vicodin [Hydrocodone-Acetaminophen] Nausea And Vomiting and Other (See Comments)    SEVERE N/V  . Zetia [Ezetimibe] Other (See Comments)    Muscle aches  . Codeine Rash  . Latex Itching and Rash    Reports Elastic in underwear, under wire bras, and now surgical cap cause rash and itching.   . Niacin Other (See Comments)    Muscle aches  . Niaspan [Niacin Er] Other (See Comments)    Muscle aches  . Statins Other (See Comments)    Muscle aches/ cramps  . Sulfa Antibiotics Rash and Other (See Comments)    Muscle aches/ cramps, also   Past Medical History:  Diagnosis Date  . AAA (abdominal aortic aneurysm)  [facility])    cardiology aware   . Arthritis    OA lt knee- cortizone inj. q 4 months AND OA ALSO IN BACK. Had knee replaced-issue resolved  . CAD (coronary artery disease) CARDIOLOGIST- DR [facility]--  VISIT 01-02-10 IN EPIC   1993-- PTCA. Pt describes a near total blockage of apparently the LAD and a 65% stenosis elsewhere.  . Cancer ([place])   . History of bladder cancer followed by dr Karsten Ro   hx  TCC of bladder ,  Ta G3-  first occurence 02-20-2012--  s/p BCG tx's  and  mitomycin C  . Hyperlipidemia 1993  . Hypertension 1993  . Nocturia   . Osteopenia   . Sigmoid diverticulosis     Current Outpatient Medications:  .  acetaminophen (TYLENOL) 500 MG tablet, Take 2 tablets (1,000 mg total) by mouth every 8 (eight) hours., Disp: 30 tablet, Rfl: 0 .  amLODipine (NORVASC) 5 MG tablet, Take 1 tablet (5 mg total) by mouth daily., Disp: 90 tablet, Rfl: 3 .  apixaban (ELIQUIS) 5 MG TABS tablet, Take 1 tablet (5 mg total) by mouth 2 (two) times daily., Disp: 60 tablet, Rfl: 6 .  aspirin EC 81 MG EC tablet, Take 1 tablet (81 mg total) by mouth daily., Disp:  , Rfl:  .  cholecalciferol (VITAMIN D) 1000 UNITS tablet, Take 1,000 Units by mouth daily after  breakfast. , Disp: , Rfl:  .  Co-Enzyme Q10 200 MG CAPS, Take 200 mg by mouth daily after breakfast. , Disp: , Rfl:  .  digoxin (LANOXIN) 0.125 MG tablet, Take 1 tablet (0.125 mg total) by mouth daily., Disp: 30 tablet, Rfl: 3 .  Evolocumab (REPATHA SURECLICK) 160 MG/ML SOAJ, Inject 1 Dose into the skin every 14 (fourteen) days., Disp: 2 pen, Rfl: 11 .  Flaxseed, Linseed, (FLAX SEED OIL) 1000 MG CAPS, Take 1,000 mg by mouth daily after breakfast. , Disp: , Rfl:  .  gabapentin (NEURONTIN) 100 MG capsule, Take 1 capsule (100 mg total) by mouth at bedtime., Disp: 90 capsule, Rfl: 3 .  levothyroxine (SYNTHROID) 50 MCG tablet, Take 1 tablet (50 mcg total) by mouth daily., Disp: 90 tablet, Rfl: 0 .  Magnesium 300 MG CAPS, Take 300 mg by mouth at bedtime. ,  Disp: , Rfl:  .  metoprolol tartrate (LOPRESSOR) 50 MG tablet, TAKE (1) TABLET TWICE A DAY., Disp: 180 tablet, Rfl: 3 .  nitroGLYCERIN (NITROSTAT) 0.4 MG SL tablet, PLACE 1 TABLET UNDER THE TONGUE AT ONSET OF CHEST PAIN EVERY 5 MINTUES UP TO 3 TIMES AS NEEDED, Disp: 25 tablet, Rfl: 0 .  traMADol (ULTRAM) 50 MG tablet, Take 50 mg by mouth every 4 (four) hours as needed for moderate pain. (Patient not taking: Reported on 12/05/2019), Disp: , Rfl:  .  TURMERIC PO, Take 1 capsule by mouth daily after breakfast., Disp: , Rfl:  .  vitamin B-12 (CYANOCOBALAMIN) 500 MCG tablet, Take 1 tablet (500 mcg total) by mouth daily., Disp: 30 tablet, Rfl: 3 No current facility-administered medications for this visit.  Facility-Administered Medications Ordered in Other Visits:  .  DOBUTamine (DOBUTREX) 1,000 mcg/mL in dextrose 5% 250 mL infusion, 20 mcg/kg/min, Intravenous, Continuous, Dorothy Spark, MD, Last Rate: 96.4 mL/hr at 12/24/15 0840, 20 mcg/kg/min at 12/24/15 0840 Social History   Socioeconomic History  . Marital status: Married    Spouse name: Not on file  . Number of children: 3  . Years of education: 21  . Highest education level: High school graduate  Occupational History  . Occupation: retired  Tobacco Use  . Smoking status: Former Smoker    Packs/day: 0.50    Years: 5.00    Pack years: 2.50    Types: Cigarettes    Quit date: 03/09/1976    Years since quitting: 43.7  . Smokeless tobacco: Never Used  Vaping Use  . Vaping Use: Never used  Substance and Sexual Activity  . Alcohol use: Not Currently    Comment: red wine  . Drug use: No  . Sexual activity: Not Currently  Other Topics Concern  . Not on file  Social History Narrative   Married with 3 children and 4 grandchildren.  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 with Parkinsons   Social Determinants of Health   Financial Resource Strain: Low Risk   . Difficulty of Paying Living Expenses: Not hard at all  Food Insecurity: No Food  Insecurity  . Worried About Charity fundraiser in the Last Year: Never true  . Ran Out of Food in the Last Year: Never true  Transportation Needs: No Transportation Needs  . Lack of Transportation (Medical): No  . Lack of Transportation (Non-Medical): No  Physical Activity: Insufficiently Active  . Days of Exercise per Week: 3 days  . Minutes of Exercise per Session: 30 min  Stress: No Stress Concern Present  . Feeling of Stress : Not at all  Social Connections: Socially Integrated  . Frequency of Communication with Friends and Family: More than three times a week  . Frequency of Social Gatherings with Friends and Family: More than three times a week  . Attends Religious Services: More than 4 times per year  . Active Member of Clubs or Organizations: Yes  . Attends Archivist Meetings: 1 to 4 times per year  . Marital Status: Married  Human resources officer Violence: Not At Risk  . Fear of Current or Ex-Partner: No  . Emotionally Abused: No  . Physically Abused: No  . Sexually Abused: No   Family History  Problem Relation Age of Onset  . Coronary artery disease Father 58  . Hyperlipidemia Father   . Heart attack Mother 21  . Hyperlipidemia Mother   . Osteoporosis Sister   . Hyperlipidemia Sister   . Heart attack Daughter 17       CABG x 5  . Heart disease Son 45       Stents x 3    Objective: Office vital signs reviewed. BP (!) 143/61   Pulse 64   Temp 98.2 F (36.8 C)   Ht 5\' 3"  (1.6 m)   Wt 175 lb 3.2 oz (79.5 kg)   SpO2 95%   BMI 31.04 kg/m   Physical Examination:  General: Awake, alert, well nourished, No acute distress HEENT: Normal, sclera white.  TMs intact bilaterally.  No obstruction.  No erythema or bulging.  No exophthalmos Neck: No goiter. Cardio: regular rate and rhythm, S1S2 heard, 2/4 blowing systolic murmur appreciated bilaterally. Pulm: clear to auscultation bilaterally, no wheezes, rhonchi or rales; normal work of breathing on room  air Chest:  Extremities: warm, well perfused, No edema, cyanosis or clubbing; +2 pulses bilaterally MSK: normal gait and station Skin: dry; intact; no rashes or lesions; normal temperature Neuro: no tremor  Assessment/ Plan: 79 y.o. female   1. Elevated TSH Likely acquired hypothyroidism.  Asymptomatic. Check thyroid panel.  Counseled on appropriate medication regimen with synthroid.  - Thyroid Panel With TSH  2. Need for immunization against influenza administered - Flu Vaccine QUAD High Dose(Fluad)  3. Decreased hearing of right ear Discussed may need ENT referral if true unilateral hearing loss.  Ear exam unremarkable today. - Ambulatory referral to Audiology  No orders of the defined types were placed in this encounter.  No orders of the defined types were placed in this encounter.    [name], DO [place] [phone]

## 2019-12-09 ENCOUNTER — Encounter: Payer: Self-pay | Admitting: Family Medicine

## 2019-12-09 ENCOUNTER — Other Ambulatory Visit: Payer: Self-pay

## 2019-12-09 ENCOUNTER — Ambulatory Visit (INDEPENDENT_AMBULATORY_CARE_PROVIDER_SITE_OTHER): Payer: Medicare Other | Admitting: Family Medicine

## 2019-12-09 VITALS — BP 143/61 | HR 64 | Temp 98.2°F | Ht 63.0 in | Wt 175.2 lb

## 2019-12-09 DIAGNOSIS — H9191 Unspecified hearing loss, right ear: Secondary | ICD-10-CM | POA: Diagnosis not present

## 2019-12-09 DIAGNOSIS — Z23 Encounter for immunization: Secondary | ICD-10-CM | POA: Diagnosis not present

## 2019-12-09 DIAGNOSIS — R7989 Other specified abnormal findings of blood chemistry: Secondary | ICD-10-CM | POA: Diagnosis not present

## 2019-12-09 NOTE — Patient Instructions (Signed)
Check your pill bottles for the levothyroxine 60mcg (thyroid medicine)  This medication should be taken on an EMPTY stomach separated by other food/ medications by at least 30 minutes.  You had labs performed today.  You will be contacted with the results of the labs once they are available, usually in the next 3 business days for routine lab work.  If you have an active my chart account, they will be released to your MyChart.  If you prefer to have these labs released to you via telephone, please let us know.  If you had a pap smear or biopsy performed, expect to be contacted in about 7-10 days.

## 2019-12-12 ENCOUNTER — Other Ambulatory Visit: Payer: Self-pay

## 2019-12-12 ENCOUNTER — Other Ambulatory Visit: Payer: Medicare Other

## 2019-12-12 DIAGNOSIS — R7989 Other specified abnormal findings of blood chemistry: Secondary | ICD-10-CM | POA: Diagnosis not present

## 2019-12-13 ENCOUNTER — Other Ambulatory Visit: Payer: Self-pay | Admitting: Family Medicine

## 2019-12-13 ENCOUNTER — Telehealth: Payer: Self-pay | Admitting: Cardiology

## 2019-12-13 LAB — THYROID PANEL WITH TSH
Free Thyroxine Index: 2.1 (ref 1.2–4.9)
T3 Uptake Ratio: 29 % (ref 24–39)
T4, Total: 7.4 ug/dL (ref 4.5–12.0)
TSH: 4.33 u[IU]/mL (ref 0.450–4.500)

## 2019-12-13 MED ORDER — LEVOTHYROXINE SODIUM 50 MCG PO TABS: 50.0000 ug | ORAL_TABLET | Freq: Every day | ORAL | 3 refills | Status: DC

## 2019-12-13 NOTE — Telephone Encounter (Signed)
Pt c/o medication issue:  1. Name of Medication: apixaban (ELIQUIS) 5 MG TABS tablet  2. How are you currently taking this medication (dosage and times per day)? 1 tablet twice a day  3. Are you having a reaction (difficulty breathing--STAT)? no  4. What is your medication issue? Patient's husband states a form for assistance with the eliquis is being faxed to [employer]. He would like a call back to update when it is faxed back.

## 2019-12-13 NOTE — Telephone Encounter (Signed)
Follow Up:    Husband is calling to see if the forms were received?

## 2019-12-14 ENCOUNTER — Telehealth: Payer: Medicare Other

## 2019-12-14 ENCOUNTER — Ambulatory Visit (INDEPENDENT_AMBULATORY_CARE_PROVIDER_SITE_OTHER): Payer: Medicare Other | Admitting: Licensed Clinical Social Worker

## 2019-12-14 ENCOUNTER — Other Ambulatory Visit: Payer: Self-pay | Admitting: Cardiology

## 2019-12-14 DIAGNOSIS — E785 Hyperlipidemia, unspecified: Secondary | ICD-10-CM | POA: Diagnosis not present

## 2019-12-14 DIAGNOSIS — M5136 Other intervertebral disc degeneration, lumbar region: Secondary | ICD-10-CM

## 2019-12-14 DIAGNOSIS — I5031 Acute diastolic (congestive) heart failure: Secondary | ICD-10-CM

## 2019-12-14 DIAGNOSIS — I10 Essential (primary) hypertension: Secondary | ICD-10-CM

## 2019-12-14 DIAGNOSIS — Z951 Presence of aortocoronary bypass graft: Secondary | ICD-10-CM

## 2019-12-14 DIAGNOSIS — I714 Abdominal aortic aneurysm, without rupture, unspecified: Secondary | ICD-10-CM

## 2019-12-14 DIAGNOSIS — E559 Vitamin D deficiency, unspecified: Secondary | ICD-10-CM

## 2019-12-14 NOTE — Patient Instructions (Addendum)
Licensed Clinical Social Worker Visit Information  Goals we discussed today:   Client will talk with LCSW in next 30 days to discuss client completion of ADLs and dialy tasks (pt-stated)         [place] (see longitudinal plan of care for additional care plan information)  Current Barriers:   Mobility challenges in client with Chronic Diagnoses of CHF, HLD, HTN, OA, DDD, Vitamin D deficiency, AAA, s/p CABG X 4  Short of breath on occasion   Clinical Social Work Clinical Goal(s):   LCSW to call client in next 30 days to tidsucs client completion of ADLs and daily activities  Interventions:  Talked with client about CCM program support  Talked with client about her mobility challenges  Talked with client about pain issues  Taked with clinet about her breathing challenges (occasional short of breath)  Talked with client about her heart surgery (heart surgery in February of 2021)  Talked with client aobut her upcoming client appointments  Talked with Yarnell about relaxation techniques of choice Mclees likes to [employer] , she likes to listen to music, and likes to read)  Talked with [place] about her family history of heart issues  Talked with Letta Median about her recent medical appointment with Dr. Lajuana Ripple  Talked with Richardine about family support  Talked with Letta Median about her next appointment with cardiologist  Talked with client about transport needs of client  Talked with client about medication procurement  Talked with client about hearing of client (client said she has an upcoming hearing evaluation appointment)  Talked with client about vision of client   Encouraged Jalayiah to call RNCM as needed for nursing support  Patient Self Care Activities:  Takes medications as prescribed Attends scheduled medical appointments   Patient Self Care Deficits:   Mobility challenges  Shortness of breath occasionally   Initial goal documentation     Follow Up  Plan:LCSW to call client in next 4 weeks to talk with her about her completion of daily ADLs and daily activities  Materials Provided: No  The patient verbalized understanding of instructions provided today and declined a print copy of patient instruction materials.   [name].Ko Bardon MSW, LCSW Licensed Clinical Social Worker [facility] [phone]

## 2019-12-14 NOTE — Telephone Encounter (Signed)
**Note De-Identified  Obfuscation** No answer so I left a message on the pts husband's VM asking him to call Jeani Hawking back at [facility] at [phone].

## 2019-12-14 NOTE — Chronic Care Management (AMB) (Signed)
Chronic Care Management    Clinical Social Work Follow Up Note  12/14/2019 Name: Gabriela Smith MRN: [id number] DOB: Jan 09, 1941  Gabriela Smith is a 79 y.o. year old female who is a primary care patient of [name], DO. The CCM team was consulted for assistance with [employer] .   Review of patient status, including review of consultants reports, other relevant assessments, and collaboration with appropriate care team members and the patient's provider was performed as part of comprehensive patient evaluation and provision of chronic care management services.    SDOH (Social Determinants of Health) assessments performed: No;risk for depression; risk of tobacco use; risk of financial strain; risk of stress; risk of physical inactivity    CARDIAC REHAB PHASE II EXERCISE from 10/21/2019 in [facility]  PHQ-9 Total Score 0     GAD 7 : Generalized Anxiety Score 07/06/2019  Nervous, Anxious, on Edge 0  Control/stop worrying 0  Worry too much - different things 0  Trouble relaxing 0  Restless 1  Easily annoyed or irritable 0  Afraid - awful might happen 0  Total GAD 7 Score 1  Anxiety Difficulty Not difficult at all    Outpatient Encounter Medications as of 12/14/2019  Medication Sig  . acetaminophen (TYLENOL) 500 MG tablet Take 2 tablets (1,000 mg total) by mouth every 8 (eight) hours.  [name] amLODipine (NORVASC) 5 MG tablet Take 1 tablet (5 mg total) by mouth daily.  [name] apixaban (ELIQUIS) 5 MG TABS tablet Take 1 tablet (5 mg total) by mouth 2 (two) times daily.  [name] aspirin EC 81 MG EC tablet Take 1 tablet (81 mg total) by mouth daily.  . cholecalciferol (VITAMIN D) 1000 UNITS tablet Take 1,000 Units by mouth daily after breakfast.   . Co-Enzyme Q10 200 MG CAPS Take 200 mg by mouth daily after breakfast.   . digoxin (LANOXIN) 0.125 MG tablet Take 1 tablet (0.125 mg total) by mouth daily.  . Evolocumab (REPATHA SURECLICK) 767 MG/ML SOAJ Inject 1 Dose  into the skin every 14 (fourteen) days.  . Flaxseed, Linseed, (FLAX SEED OIL) 1000 MG CAPS Take 1,000 mg by mouth daily after breakfast.   . gabapentin (NEURONTIN) 100 MG capsule Take 1 capsule (100 mg total) by mouth at bedtime.  [name] levothyroxine (SYNTHROID) 50 MCG tablet Take 1 tablet (50 mcg total) by mouth daily.  . Magnesium 300 MG CAPS Take 300 mg by mouth at bedtime.   . metoprolol tartrate (LOPRESSOR) 50 MG tablet TAKE (1) TABLET TWICE A DAY.  . nitroGLYCERIN (NITROSTAT) 0.4 MG SL tablet PLACE 1 TABLET UNDER THE TONGUE AT ONSET OF CHEST PAIN EVERY 5 MINTUES UP TO 3 TIMES AS NEEDED  . traMADol (ULTRAM) 50 MG tablet Take 50 mg by mouth every 4 (four) hours as needed for moderate pain.   . TURMERIC PO Take 1 capsule by mouth daily after breakfast.  . vitamin B-12 (CYANOCOBALAMIN) 500 MCG tablet Take 1 tablet (500 mcg total) by mouth daily.   Facility-Administered Encounter Medications as of 12/14/2019  Medication  . DOBUTamine (DOBUTREX) 1,000 mcg/mL in dextrose 5% 250 mL infusion    Goals    .  Client will talk with LCSW in next 30 days to discuss client completion of ADLs and dialy tasks (pt-stated)      CARE PLAN ENTRY (see longitudinal plan of care for additional care plan information)  Current Barriers:  [name] Mobility challenges in client with Chronic Diagnoses of CHF, HLD, HTN,  OA, DDD, Vitamin D deficiency, AAA, s/p CABG X 4 . Short of breath on occasion   Clinical Social Work Clinical Goal(s):  [name] LCSW to call client in next 30 days to tidsucs client completion of ADLs and daily activities  Interventions: . Talked with client about CCM program support . Talked with client about her mobility challenges . Talked with client about pain issues . Taked with clinet about her breathing challenges (occasional short of breath) . Talked with client about her heart surgery (heart surgery in February of 2021) . Talked with client aobut her upcoming client appointments . Talked with Gabriela Smith  about relaxation techniques of choice Gabriela Smith likes to cook , she likes to listen to music, and likes to read) . Talked with Gabriela Smith about her family history of heart issues . Talked with Gabriela Smith about her recent medical appointment with Dr. Lajuana Ripple . Talked with Gabriela Smith about family support . Talked with Gabriela Smith about her next appointment with cardiologist . Talked with client about transport needs of client . Talked with client about medication procurement . Talked with client about hearing of client (client said she has an upcoming hearing evaluation appointment) . Talked with client about vision of client  . Encouraged Gabriela Smith to call [facility] as needed for nursing support  Patient Self Care Activities:  Takes medications as prescribed Attends scheduled medical appointments  . Patient Self Care Deficits:  [name] Mobility challenges . Shortness of breath occasionally   Initial goal documentation     Follow Up Plan: LCSW to call client in next 4 weeks to talk with her about her completion of daily ADLs and daily activities  [name].Jaelen Soth MSW, LCSW Licensed Clinical Social Worker [facility] [phone]

## 2019-12-15 NOTE — Telephone Encounter (Signed)
**Note De-Identified  Obfuscation** The pts husband and DPR Gabriela Smith states that the MD part of his [place] pt asst application was completed at our [place] office by Dr Cherlyn Cushing nurse and that she had Dr Percival Spanish sign it and gave it back to him so he could complete the pts part of the application and mail/fax to [facility].   He states that he did fax his wifes completed application in in Sept and wants to know if I have heard from [place] concerning her application. I advised him that I have not and advised him to call them to check the progress of her application and to let me know if they need anything from [place].  He thanked me for the update and states he will call [facility] soon.

## 2019-12-15 NOTE — Telephone Encounter (Signed)
Patient's husband returning call. 

## 2019-12-20 ENCOUNTER — Ambulatory Visit: Payer: Medicare Other | Admitting: *Deleted

## 2019-12-20 ENCOUNTER — Telehealth: Payer: Self-pay | Admitting: *Deleted

## 2019-12-20 DIAGNOSIS — I5031 Acute diastolic (congestive) heart failure: Secondary | ICD-10-CM | POA: Diagnosis not present

## 2019-12-20 DIAGNOSIS — I739 Peripheral vascular disease, unspecified: Secondary | ICD-10-CM

## 2019-12-20 DIAGNOSIS — I10 Essential (primary) hypertension: Secondary | ICD-10-CM | POA: Diagnosis not present

## 2019-12-20 DIAGNOSIS — E785 Hyperlipidemia, unspecified: Secondary | ICD-10-CM | POA: Diagnosis not present

## 2019-12-20 NOTE — Telephone Encounter (Signed)
Yes, ok to increase to 200mg  at bedtime x1 week. If still having significant symptoms, may increase again to 300mg . Please make sure she has a f/u with me if this is an ongoing problem.

## 2019-12-20 NOTE — Patient Instructions (Signed)
Visit Information  Goals Addressed              This Visit's Progress     Patient Stated   .  "I want the fluid in my legs to get better" (pt-stated)   On track     [place] (see longitudinal plan of care for additional care plan information)  Current Barriers:  . Care Coordination needs related to lower extremity edema in a patient with CHF, HTN, and peripheral vascular insuficiency (disease states)  Nurse Case Manager Clinical Goal(s):  [name] Over the next 90 days, patient will keep all scheduled medical appointments . Over the next 60 days, patient will talk with RN Care Manager regarding lower extremity edema . Over the next 60 days, patient will monitor morning weight and call cardiologist with any weight gain of >3 lbs in 24 hours of >5 lbs in one week  Interventions:  . Inter-disciplinary care team collaboration (see longitudinal plan of care) . Chart reviewed including recent office notes and lab results . Discussed lower extremity edema o Reports that it has improved. Still has some in left leg and ankle . Recommended that she weight and record her weight daily o Weight fluctuates about a 1 lb  . Advised to call cardiologist with any weight increase of 3 lbs in one day or 5 lbs in one week . Recommend to limit sodium intake . Reviewed upcoming appointments: Dr Percival Spanish on 01/25/20 . Encouraged patient to reach out to [facility] Manager as needed  Patient Self Care Activities:  . Performs ADL's independently . Performs IADL's independently  Please see past updates related to this goal by clicking on the "Past Updates" button in the selected goal      .  "I want the pain in my feet to get better" (pt-stated)        [place] (see longitudinal plan of care for additional care plan information)  Current Barriers:  . Care Coordination needs related to neuropathy in both feet in a patient with HTN, CAD, CHF, and peripheral vascular insufficiency  Nurse Case  Manager Clinical Goal(s):  [name] Over the next 7 days, patient will talk with PCP/clinical staff regarding worsening neuropathy in both feet . Over the next 30 days, patient will talk with RN Care Manager regarding neuropathy  Interventions:  . Inter-disciplinary care team collaboration (see longitudinal plan of care) . Chart reviewed including recent office notes and lab results . Reviewed and discussed medications: gabapentin 100mg  qhs o Discussed usage and side effects . Discussed current symptoms o Pain in both feet that is affecting ambulation . Recommend fall precautions and safety measures . Collaborated with PCP regarding gabapentin and questioned if dose increase is appropriate . Advised patient that she should get a call from PCP office regarding gabapentin . Encouraged patient to reach out to PCP with new or worsening symptoms . Encouraged patient to reach out to [facility] as needed . Appt scheduled with RN Care Manager to f/u on 01/17/20  Patient Self Care Activities:  . Performs ADL's independently . Performs IADL's independently  Initial goal documentation     .  "I want to keep my blood pressure under control" (pt-stated)   On track     [place] (see longitudinal plan of care for additional care plan information)  Current Barriers:  . Chronic Disease Management support and education needs related to hypertension  Nurse Case Manager Clinical Goal(s):  [name] Over the  next 60 days, patient will work with Consulting civil engineer regarding hypertension . Over the next 90 days, patient will keep all scheduled medical appointments . Over the next 90 days, patient will take all medications as prescribed . Over the next 90 days, patient will report any new or worsening symptoms to PCP or cardiologist  Interventions:  . Inter-disciplinary care team collaboration (see longitudinal plan of care) . Chart reviewed including recent notes: OV, urgent care, telephone, and cardiac  rehab . Reviewed medications  . Discussed home monitoring . Encouraged to continue measuring and recording BP daily and to call PCP or cardiologist with any readings outside of provider recommended range . Recommend to limit sodium intake . Provided with RNCM contact information and encouraged to reach out as needed  Patient Self Care Activities:  . Performs ADL's independently . Performs IADL's independently  Please see past updates related to this goal by clicking on the "Past Updates" button in the selected goal        Other   .  "I want to manage my thyroid"   On track     [place] (see longitudinal plan of care for additional care plan information)  Current Barriers:  [name] Knowledge Deficits related to TSH elevation . Care coordination needs related to elevated TSH needed in a patient with Afib, CHF, and HTN.  Nurse Case Manager Clinical Goal(s):  [name] Over the next 90 days, patient will take levothyroxine as prescribed . Over the next 90 days, patient will reach out to PCP with any new or worsening symptoms  Interventions:  . Inter-disciplinary care team collaboration (see longitudinal plan of care) . Chart reviewed including recent office notes and lab results . Reviewed recent TSH results o Within normal range since starting levothyroxine  . Reviewed and discussed medications o Taking levothyroxine late night/early morning. She ok'd that with PCP.  o No side effects . Reviewed upcoming appointments . Encouraged patient to reach out to PCP with any new or worsening symptoms . Encouraged patient to reach out to [facility] Manager as needed  Patient Self Care Activities:  . Performs ADL's independently . Performs IADL's independently  Please see past updates related to this goal by clicking on the "Past Updates" button in the selected goal         Patient verbalizes understanding of instructions provided today.   Follow-up Plan Telephone follow up appointment  with care management team member scheduled for: 01/17/20 with RN Care Manager Follow up with provider re: neuropathy Appt with cardiologist on 01/25/20  Chong Sicilian, BSN, RN-BC [place] / [place] Management Direct Dial: [phone]

## 2019-12-20 NOTE — Telephone Encounter (Signed)
I'll let her know. Thank you!  Cyril Mourning

## 2019-12-20 NOTE — Chronic Care Management (AMB) (Signed)
Update to Care Plan:  Goals Addressed      Patient Stated   .  "I want the pain in my feet to get better" (pt-stated)        CARE PLAN ENTRY (see longitudinal plan of care for additional care plan information)  Current Barriers:  . Care Coordination needs related to neuropathy in both feet in a patient with HTN, CAD, CHF, and peripheral vascular insufficiency  Nurse Case Manager Clinical Goal(s):  [name] Over the next 7 days, patient will talk with PCP/clinical staff regarding worsening neuropathy in both feet . Over the next 30 days, patient will talk with RN Care Manager regarding neuropathy  Interventions:  . Inter-disciplinary care team collaboration (see longitudinal plan of care) . Chart reviewed including recent office notes and lab results . Reviewed and discussed medications: gabapentin 100mg  qhs o Discussed usage and side effects . Discussed current symptoms o Pain in both feet that is affecting ambulation . Recommend fall precautions and safety measures . Collaborated with PCP regarding gabapentin and questioned if dose increase is appropriate o Per Dr Lajuana Ripple: Yes, ok to increase to 200mg  at bedtime x1 week. If still having significant symptoms, may increase again to 300mg . Please make sure she has a f/u with me if this is an ongoing problem. . Patient advised of PCP recommendation. Questions answered . Patient will f/u in 2 weeks to discuss if neuropathy has improved. If not, a PCP visit will be needed. If so, a new script with new directions will need to be written.  . Encouraged patient to reach out to PCP with new or worsening symptoms . Encouraged patient to reach out to [facility] as needed . Appt scheduled with RN Care Manager to f/u on 01/17/20  Patient Self Care Activities:  . Performs ADL's independently . Performs IADL's independently  Initial goal documentation

## 2019-12-20 NOTE — Chronic Care Management (AMB) (Signed)
Chronic Care Management   Follow Up Note   12/20/2019 Name: Gabriela Smith MRN: [id number] DOB: 1940/07/03  Referred by: [name], DO Reason for referral : Chronic Care Management (RN follow up)   Gabriela Smith is a 79 y.o. year old female who is a primary care patient of [name], DO. The CCM team was consulted for assistance with chronic disease management and care coordination needs.    Review of patient status, including review of consultants reports, relevant laboratory and other test results, and collaboration with appropriate care team members and the patient's provider was performed as part of comprehensive patient evaluation and provision of chronic care management services.    SDOH (Social Determinants of Health) assessments performed: No See Care Plan activities for detailed interventions related to [facility])     Outpatient Encounter Medications as of 12/20/2019  Medication Sig  . acetaminophen (TYLENOL) 500 MG tablet Take 2 tablets (1,000 mg total) by mouth every 8 (eight) hours.  [name] amLODipine (NORVASC) 5 MG tablet Take 1 tablet (5 mg total) by mouth daily.  [name] apixaban (ELIQUIS) 5 MG TABS tablet Take 1 tablet (5 mg total) by mouth 2 (two) times daily.  [name] aspirin EC 81 MG EC tablet Take 1 tablet (81 mg total) by mouth daily.  . cholecalciferol (VITAMIN D) 1000 UNITS tablet Take 1,000 Units by mouth daily after breakfast.   . Co-Enzyme Q10 200 MG CAPS Take 200 mg by mouth daily after breakfast.   . digoxin (LANOXIN) 0.125 MG tablet TAKE 1 TABLET DAILY  . Evolocumab (REPATHA SURECLICK) 008 MG/ML SOAJ Inject 1 Dose into the skin every 14 (fourteen) days.  . Flaxseed, Linseed, (FLAX SEED OIL) 1000 MG CAPS Take 1,000 mg by mouth daily after breakfast.   . gabapentin (NEURONTIN) 100 MG capsule Take 1 capsule (100 mg total) by mouth at bedtime.  [name] GNP VITAMIN B-12 500 MCG tablet TAKE 1 TABLET DAILY  . levothyroxine (SYNTHROID) 50 MCG tablet Take 1 tablet (50  mcg total) by mouth daily.  . Magnesium 300 MG CAPS Take 300 mg by mouth at bedtime.   . metoprolol tartrate (LOPRESSOR) 50 MG tablet TAKE (1) TABLET TWICE A DAY.  . nitroGLYCERIN (NITROSTAT) 0.4 MG SL tablet PLACE 1 TABLET UNDER THE TONGUE AT ONSET OF CHEST PAIN EVERY 5 MINTUES UP TO 3 TIMES AS NEEDED  . traMADol (ULTRAM) 50 MG tablet Take 50 mg by mouth every 4 (four) hours as needed for moderate pain.   . TURMERIC PO Take 1 capsule by mouth daily after breakfast.   Facility-Administered Encounter Medications as of 12/20/2019  Medication  . DOBUTamine (DOBUTREX) 1,000 mcg/mL in dextrose 5% 250 mL infusion      Goals Addressed              This Visit's Progress     Patient Stated   .  "I want the fluid in my legs to get better" (pt-stated)   On track     [place] (see longitudinal plan of care for additional care plan information)  Current Barriers:  . Care Coordination needs related to lower extremity edema in a patient with CHF, HTN, and peripheral vascular insuficiency (disease states)  Nurse Case Manager Clinical Goal(s):  [name] Over the next 90 days, patient will keep all scheduled medical appointments . Over the next 60 days, patient will talk with RN Care Manager regarding lower extremity edema . Over the next 60 days, patient will monitor morning  weight and call cardiologist with any weight gain of >3 lbs in 24 hours of >5 lbs in one week  Interventions:  . Inter-disciplinary care team collaboration (see longitudinal plan of care) . Chart reviewed including recent office notes and lab results . Discussed lower extremity edema o Reports that it has improved. Still has some in left leg and ankle . Recommended that she weight and record her weight daily o Weight fluctuates about a 1 lb  . Advised to call cardiologist with any weight increase of 3 lbs in one day or 5 lbs in one week . Recommend to limit sodium intake . Reviewed upcoming appointments: Dr Percival Spanish on  01/25/20 . Encouraged patient to reach out to [facility] Manager as needed  Patient Self Care Activities:  . Performs ADL's independently . Performs IADL's independently  Please see past updates related to this goal by clicking on the "Past Updates" button in the selected goal      .  "I want the pain in my feet to get better" (pt-stated)        [place] (see longitudinal plan of care for additional care plan information)  Current Barriers:  . Care Coordination needs related to neuropathy in both feet in a patient with HTN, CAD, CHF, and peripheral vascular insufficiency  Nurse Case Manager Clinical Goal(s):  [name] Over the next 7 days, patient will talk with PCP/clinical staff regarding worsening neuropathy in both feet . Over the next 30 days, patient will talk with RN Care Manager regarding neuropathy  Interventions:  . Inter-disciplinary care team collaboration (see longitudinal plan of care) . Chart reviewed including recent office notes and lab results . Reviewed and discussed medications: gabapentin 100mg  qhs o Discussed usage and side effects . Discussed current symptoms o Pain in both feet that is affecting ambulation . Recommend fall precautions and safety measures . Collaborated with PCP regarding gabapentin and questioned if dose increase is appropriate . Advised patient that she should get a call from PCP office regarding gabapentin . Encouraged patient to reach out to PCP with new or worsening symptoms . Encouraged patient to reach out to [facility] as needed . Appt scheduled with RN Care Manager to f/u on 01/17/20  Patient Self Care Activities:  . Performs ADL's independently . Performs IADL's independently  Initial goal documentation     .  "I want to keep my blood pressure under control" (pt-stated)   On track     [place] (see longitudinal plan of care for additional care plan information)  Current Barriers:  . Chronic Disease Management  support and education needs related to hypertension  Nurse Case Manager Clinical Goal(s):  [name] Over the next 60 days, patient will work with Consulting civil engineer regarding hypertension . Over the next 90 days, patient will keep all scheduled medical appointments . Over the next 90 days, patient will take all medications as prescribed . Over the next 90 days, patient will report any new or worsening symptoms to PCP or cardiologist  Interventions:  . Inter-disciplinary care team collaboration (see longitudinal plan of care) . Chart reviewed including recent notes: OV, urgent care, telephone, and cardiac rehab . Reviewed medications  . Discussed home monitoring . Encouraged to continue measuring and recording BP daily and to call PCP or cardiologist with any readings outside of provider recommended range . Recommend to limit sodium intake . Provided with RNCM contact information and encouraged to reach out as needed  Patient  Self Care Activities:  . Performs ADL's independently . Performs IADL's independently  Please see past updates related to this goal by clicking on the "Past Updates" button in the selected goal        Other   .  "I want to manage my thyroid"   On track     [place] (see longitudinal plan of care for additional care plan information)  Current Barriers:  [name] Knowledge Deficits related to TSH elevation . Care coordination needs related to elevated TSH needed in a patient with Afib, CHF, and HTN.  Nurse Case Manager Clinical Goal(s):  [name] Over the next 90 days, patient will take levothyroxine as prescribed . Over the next 90 days, patient will reach out to PCP with any new or worsening symptoms  Interventions:  . Inter-disciplinary care team collaboration (see longitudinal plan of care) . Chart reviewed including recent office notes and lab results . Reviewed recent TSH results o Within normal range since starting levothyroxine  . Reviewed and discussed  medications o Taking levothyroxine late night/early morning. She ok'd that with PCP.  o No side effects . Reviewed upcoming appointments . Encouraged patient to reach out to PCP with any new or worsening symptoms . Encouraged patient to reach out to [facility] Manager as needed  Patient Self Care Activities:  . Performs ADL's independently . Performs IADL's independently  Please see past updates related to this goal by clicking on the "Past Updates" button in the selected goal          Plan:   Telephone follow up appointment with care management team member scheduled for: 01/17/20 with RN Care Manager Follow up with provider re: neuropathy Appt with cardiologist on 01/25/20  Chong Sicilian, BSN, RN-BC [place] / [place] Management Direct Dial: [phone]

## 2019-12-20 NOTE — Telephone Encounter (Signed)
12/20/2019  I spoke with Ms Tessmer by telephone today regarding CCM. She is taking gabapentin 100mg  at bedtime but complains of neuropathy in both of her feet that has gotten worse over time. It is quite uncomfortable and affects her ambulation. Discussed gabapentin benefits and side effects with patient.   Forwarding to PCP to review to see if increase in gabapentin dose is appropriate.   Chong Sicilian, BSN, RN-BC Embedded Chronic Care Manager [facility] / [place] Management Direct Dial: [phone]

## 2019-12-21 ENCOUNTER — Telehealth: Payer: Self-pay | Admitting: Cardiology

## 2019-12-21 NOTE — Telephone Encounter (Signed)
New message:      Patient  Husband returning a call back. Please call back

## 2019-12-21 NOTE — Telephone Encounter (Signed)
I spoke with this pt's husband regarding pt assistance paperwork.  Advised [employer] app will need to be re-done.  Asked him to please only fill out the pt's portion for assistance and leave it at the [place] office.  The applications emailed to me had white-out on the forms and different information written in.  Dr Percival Spanish had signed it but they had a different providers information written it.  [employer]MS will not accept the form this way.  Husband states understanding and will leave the forms in the [place] office.

## 2019-12-21 NOTE — Telephone Encounter (Addendum)
**Note De-Identified  Obfuscation** I s/w the pts husband on 10/7 as follows: December 15, 2019 Me  10:53 AM Note The pts husband and DPR Laverna Peace states that the MD part of his [place] pt asst application was completed at our [place] office by Dr Cherlyn Cushing nurse and that she had Dr Percival Spanish sign it and gave it back to him so he could complete the pts part of the application and mail/fax to [employer].   He states that he did fax his wifes completed application in in Sept and wants to know if I have heard from [place] concerning her application. I advised him that I have not and advised him to call them to check the progress of her application and to let me know if they need anything from [place].  He thanked me for the update and states he will call [employer] soon.     We received a letter from [place] yesterday stating that the application must be signed by provider. They did attach the MD page of the application that they received with my name written in as the office contact person it but I am unaware of this application and am unsure who handled it at the [facility] office.  After reviewing the application Junie Panning Barret's information was written in as the provider but it looks like the application was signed by Dr Percival Spanish so they will not accept it.  Will forward this phone note to Dr Cherlyn Cushing nurse to call the pts husband to discuss as I am unsure.

## 2019-12-26 ENCOUNTER — Telehealth: Payer: Self-pay | Admitting: *Deleted

## 2019-12-26 NOTE — Telephone Encounter (Signed)
Received fax from [facility] Patient Assistance-patient has been approved [id number]   Application Case # [id number]

## 2019-12-26 NOTE — Telephone Encounter (Addendum)
**Note De-Identified  Obfuscation** New [employer] pt asst application not needed as BMS accepted the corrected application that we faxed to them last week.  I have notified the pt of this approval and she expressed much gratitude as she states that they just had to purchase a refill of her Eliquis recently that cost more than $300 for a 30 day supply.  She is aware that when her Eliquis goes up in cost next year to call [employer] and ask that they mail her an application in the mail and that when she receives it to complete her part of the application and to take it to Dr [employer] office to drop off and that we will take care of Dr Hochrein's part and we will fax it to [place].  She verbalized understanding and thanked me for calling them to let them know she has been approved.

## 2020-01-17 ENCOUNTER — Ambulatory Visit: Payer: Medicare Other | Admitting: *Deleted

## 2020-01-17 DIAGNOSIS — I10 Essential (primary) hypertension: Secondary | ICD-10-CM

## 2020-01-17 DIAGNOSIS — I5031 Acute diastolic (congestive) heart failure: Secondary | ICD-10-CM

## 2020-01-17 NOTE — Patient Instructions (Signed)
Follow Up Plan: The care management team will reach out to the patient again over the next 30 days.  Asked patient's husband to leave her a message requesting that she return my call  Chong Sicilian, BSN, RN-BC [place] / [place] Management Direct Dial: [phone]

## 2020-01-17 NOTE — Chronic Care Management (AMB) (Signed)
  Chronic Care Management   Outreach Note  01/17/2020 Name: Gabriela Smith MRN: [id number] DOB: December 30, 1940  Referred by: [name], DO Reason for referral : Chronic Care Management (RN follow up)   An unsuccessful telephone follow-up was attempted today. The patient was referred to the case management team for assistance with care management and care coordination. I was unable to call at our appointment time and per her husband, she was not at home when I did reach out  Follow Up Plan: The care management team will reach out to the patient again over the next 30 days.  Asked patient's husband to leave her a message requesting that she return my call  Chong Sicilian, BSN, RN-BC [place] / [place] Management Direct Dial: [phone]

## 2020-01-20 ENCOUNTER — Ambulatory Visit: Payer: Medicare Other | Admitting: Licensed Clinical Social Worker

## 2020-01-20 DIAGNOSIS — E559 Vitamin D deficiency, unspecified: Secondary | ICD-10-CM

## 2020-01-20 DIAGNOSIS — Z951 Presence of aortocoronary bypass graft: Secondary | ICD-10-CM

## 2020-01-20 DIAGNOSIS — I10 Essential (primary) hypertension: Secondary | ICD-10-CM

## 2020-01-20 DIAGNOSIS — I714 Abdominal aortic aneurysm, without rupture, unspecified: Secondary | ICD-10-CM

## 2020-01-20 DIAGNOSIS — I5031 Acute diastolic (congestive) heart failure: Secondary | ICD-10-CM

## 2020-01-20 DIAGNOSIS — M5136 Other intervertebral disc degeneration, lumbar region: Secondary | ICD-10-CM

## 2020-01-20 DIAGNOSIS — E785 Hyperlipidemia, unspecified: Secondary | ICD-10-CM

## 2020-01-20 DIAGNOSIS — M51369 Other intervertebral disc degeneration, lumbar region without mention of lumbar back pain or lower extremity pain: Secondary | ICD-10-CM

## 2020-01-20 NOTE — Patient Instructions (Addendum)
Licensed Clinical Social Worker Visit Information  Goals we discussed today:   .  Client will talk with LCSW in next 30 days to discuss client completion of ADLs and dialy tasks (pt-stated)       CARE PLAN ENTRY (see longitudinal plan of care for additional care plan information)  Current Barriers:   Mobility challenges in client with Chronic Diagnoses of CHF, HLD, HTN, OA, DDD, Vitamin D deficiency, AAA, s/p CABG X 4  Short of breath on occasion   Clinical Social Work Clinical Goal(s):   LCSW to call client in next 30 days to discuss client completion of ADLs and daily activities  Interventions:  Talked with client about CCM program support  Talked with client about her mobility challenges (uses a cane to help her walk)  Talked with client about pain issues  Taked with clinet about her breathing challenges (occasional short of breath)  Talked with client about her heart surgery (heart surgery in February of 2021)  Talked with client aobut her upcoming client appointments  Talked with Letta Median about relaxation techniques of choice Reza likes to [employer] , she likes to listen to music, and likes to read)  Talked with Laverna Peace about client support from cardiologist (she has appointment with Dr. Jenkins Rouge next week   Talked with Oline about her family history of heart issues  Talked with Darliss Ridgel about client's family support  Talked with Laverna Peace about transport help for client  Talked with client about her ankle sprain   Talked with client about her hearing (she recently obtained hearing aids)  Talked with client about sleeping issues of client  Talked with client about appetite of client  Talked with client about swelling in her left leg (she said she has talked with Dr. Lajuana Ripple about this issue)  Collaborated with RNCM to discuss nursing needs of client  Talked with Laverna Peace about medication costs for client  Collaborated with [facility] Triage nurse about client  needs   Patient Self Care Activities:  Takes medications as prescribed Attends scheduled medical appointments   Patient Self Care Deficits:   Mobility challenges  Shortness of breath occasionally   Initial goal documentation     Follow Up Plan: LCSW to call client in next 4 weeks to talk with her about her completion of daily ADLs and daily activities  Materials Provided: No  The patient verbalized understanding of instructions provided today and declined a print copy of patient instruction materials.   [name].Ansleigh Safer MSW, LCSW Licensed Clinical Social Worker [facility] [phone]

## 2020-01-20 NOTE — Chronic Care Management (AMB) (Signed)
Chronic Care Management    Clinical Social Work Follow Up Note  01/20/2020 Name: Gabriela Smith MRN: [id number] DOB: 02/02/1941  Gabriela Smith is a 79 y.o. year old female who is a primary care patient of [name], DO. The CCM team was consulted for assistance with [employer] .   Review of patient status, including review of consultants reports, other relevant assessments, and collaboration with appropriate care team members and the patient's provider was performed as part of comprehensive patient evaluation and provision of chronic care management services.    SDOH (Social Determinants of Health) assessments performed: No; risk for tobacco use; risk for depression; risk for stress; risk for physical inactivity    CARDIAC REHAB PHASE II EXERCISE from 10/21/2019 in [facility]  PHQ-9 Total Score 0       GAD 7 : Generalized Anxiety Score 07/06/2019  Nervous, Anxious, on Edge 0  Control/stop worrying 0  Worry too much - different things 0  Trouble relaxing 0  Restless 1  Easily annoyed or irritable 0  Afraid - awful might happen 0  Total GAD 7 Score 1  Anxiety Difficulty Not difficult at all    Outpatient Encounter Medications as of 01/20/2020  Medication Sig  . acetaminophen (TYLENOL) 500 MG tablet Take 2 tablets (1,000 mg total) by mouth every 8 (eight) hours.  [name] amLODipine (NORVASC) 5 MG tablet Take 1 tablet (5 mg total) by mouth daily.  [name] apixaban (ELIQUIS) 5 MG TABS tablet Take 1 tablet (5 mg total) by mouth 2 (two) times daily.  [name] aspirin EC 81 MG EC tablet Take 1 tablet (81 mg total) by mouth daily.  . cholecalciferol (VITAMIN D) 1000 UNITS tablet Take 1,000 Units by mouth daily after breakfast.   . Co-Enzyme Q10 200 MG CAPS Take 200 mg by mouth daily after breakfast.   . digoxin (LANOXIN) 0.125 MG tablet TAKE 1 TABLET DAILY  . Evolocumab (REPATHA SURECLICK) 510 MG/ML SOAJ Inject 1 Dose into the skin every 14 (fourteen) days.  .  Flaxseed, Linseed, (FLAX SEED OIL) 1000 MG CAPS Take 1,000 mg by mouth daily after breakfast.   . gabapentin (NEURONTIN) 100 MG capsule Take 1 capsule (100 mg total) by mouth at bedtime.  [name] GNP VITAMIN B-12 500 MCG tablet TAKE 1 TABLET DAILY  . levothyroxine (SYNTHROID) 50 MCG tablet Take 1 tablet (50 mcg total) by mouth daily.  . Magnesium 300 MG CAPS Take 300 mg by mouth at bedtime.   . metoprolol tartrate (LOPRESSOR) 50 MG tablet TAKE (1) TABLET TWICE A DAY.  . nitroGLYCERIN (NITROSTAT) 0.4 MG SL tablet PLACE 1 TABLET UNDER THE TONGUE AT ONSET OF CHEST PAIN EVERY 5 MINTUES UP TO 3 TIMES AS NEEDED  . traMADol (ULTRAM) 50 MG tablet Take 50 mg by mouth every 4 (four) hours as needed for moderate pain.   . TURMERIC PO Take 1 capsule by mouth daily after breakfast.   Facility-Administered Encounter Medications as of 01/20/2020  Medication  . DOBUTamine (DOBUTREX) 1,000 mcg/mL in dextrose 5% 250 mL infusion     Goals         .  Client will talk with LCSW in next 30 days to discuss client completion of ADLs and dialy tasks (pt-stated)      CARE PLAN ENTRY (see longitudinal plan of care for additional care plan information)  Current Barriers:  [name] Mobility challenges in client with Chronic Diagnoses of CHF, HLD, HTN, OA, DDD, Vitamin D deficiency,  AAA, s/p CABG X 4 . Short of breath on occasion   Clinical Social Work Clinical Goal(s):  [name] LCSW to call client in next 30 days to discuss client completion of ADLs and daily activities  Interventions: . Talked with client about CCM program support . Talked with client about her mobility challenges (uses a cane to help her walk) . Talked with client about pain issues . Taked with clinet about her breathing challenges (occasional short of breath) . Talked with client about her heart surgery (heart surgery in February of 2021) . Talked with client aobut her upcoming client appointments . Talked with Letta Median about relaxation techniques of choice  Sindt likes to cook , she likes to listen to music, and likes to read) . Talked with Laverna Peace about client support from cardiologist (she has appointment with Dr. Jenkins Rouge next week  . Talked with Veronique about her family history of heart issues . Talked with Darliss Ridgel about client's family support . Talked with Laverna Peace about transport help for client . Talked with client about her ankle sprain  . Talked with client about her hearing (she recently obtained hearing aids) . Talked with client about sleeping issues of client . Talked with client about appetite of client . Talked with client about swelling in her left leg (she said she has talked with Dr. Lajuana Ripple about this issue) . Collaborated with RNCM to discuss nursing needs of client . Talked with Laverna Peace about medication costs for client . Collaborated with [facility] Triage nurse about client needs   Patient Self Care Activities:  Takes medications as prescribed Attends scheduled medical appointments  . Patient Self Care Deficits:  [name] Mobility challenges . Shortness of breath occasionally   Initial goal documentation     Follow Up Plan: LCSW to call client in next 4 weeks to talk with her about her completion of daily ADLs and daily activities  [name].Kandee Escalante MSW, LCSW Licensed Clinical Social Worker [facility] [phone]

## 2020-01-24 ENCOUNTER — Telehealth: Payer: Self-pay | Admitting: Cardiology

## 2020-01-24 NOTE — Progress Notes (Signed)
Virtual Visit via Telephone Note   This visit type was conducted due to national recommendations for restrictions regarding the COVID-19 Pandemic (e.g. social distancing) in an effort to limit this patient's exposure and mitigate transmission in our community.  Due to her co-morbid illnesses, this patient is at least at moderate risk for complications without adequate follow up.  This format is felt to be most appropriate for this patient at this time.  The patient did not have access to video technology/had technical difficulties with video requiring transitioning to audio format only (telephone).  All issues noted in this document were discussed and addressed.  No physical exam could be performed with this format.  Please refer to the patient's chart for her  consent to telehealth for [facility].    Date:  01/25/2020   ID:  Gabriela Smith, DOB 1940/04/11, MRN [id number] The patient was identified using 2 identifiers.  Patient Location: Home Provider Location: Office/Clinic  PCP:  [name], DO  Cardiologist:  Minus Breeding, MD  Electrophysiologist:  None   Evaluation Performed:  Follow-Up Visit  Chief Complaint:  Leg swelling  History of Present Illness:    Gabriela Smith is a 79 y.o. female with who presents for follow up after CABG.    She had a cardiac cath on 04/19/2019 noting severe multivessel disease, with mild osteal and severe distal left main disease involving the LCx with severe proximal and mid LAD diease, as well as ostial and mid RCA diffuse disease. As a result, she underwent CABG X 4 with LIMA to LAD, SVG to Mid LAD, SVG to OM, and SVG to PDA.   Since I last saw her she has done well from a cardiac standpoint.  Unfortunately she fell and hurt her ankle.  She also has a cold.  She denies any cardiac symptoms however.  She thinks she is recovered very nicely from the surgery.  She denies any chest pressure, neck or arm discomfort.  She does not have any  shortness of breath, PND or orthopnea.  She has no palpitations, presyncope or syncope.  She still has mild bilateral lower extremity swelling.  The patient does not have symptoms concerning for COVID-19 infection (fever, chills, cough, or new shortness of breath).    Past Medical History:  Diagnosis Date  . AAA (abdominal aortic aneurysm) [facility])    cardiology aware   . Arthritis    OA lt knee- cortizone inj. q 4 months AND OA ALSO IN BACK. Had knee replaced-issue resolved  . CAD (coronary artery disease) CARDIOLOGIST- [facility]--  VISIT 01-02-10 IN EPIC   1993-- PTCA. Pt describes a near total blockage of apparently the LAD and a 65% stenosis elsewhere.  . Cancer ([facility])   . History of bladder cancer followed by dr Karsten Ro   hx  TCC of bladder ,  Ta G3-  first occurence 02-20-2012--  s/p BCG tx's  and  mitomycin C  . Hyperlipidemia 1993  . Hypertension 1993  . Nocturia   . Osteopenia   . Sigmoid diverticulosis    Past Surgical History:  Procedure Laterality Date  . CHOLECYSTECTOMY  2003 (approx)  . CLIPPING OF ATRIAL APPENDAGE N/A 04/25/2019   Procedure: Clipping Of Atrial Appendage;  Surgeon: Lajuana Matte, MD;  Location: [place];  Service: Open Heart Surgery;  Laterality: N/A;  . COLONOSCOPY  05-31-2003  . CORONARY ANGIOPLASTY  1993   to LAD  . CORONARY ARTERY BYPASS GRAFT N/A 04/25/2019  Procedure: CORONARY ARTERY BYPASS GRAFTING (CABG) times four using left internal mammary artery and left greater saphenous vein harvested endoscipically.;  Surgeon: Lajuana Matte, MD;  Location: [place];  Service: Open Heart Surgery;  Laterality: N/A;  . CYSTOSCOPY WITH BIOPSY  05/09/2011   Procedure: CYSTOSCOPY WITH BIOPSY;  Surgeon: Claybon Jabs, MD;  Location: [facility];  Service: Urology;  Laterality: N/A;  WITH BLADDER BIOPSY GYRUS  . LEFT HEART CATH AND CORONARY ANGIOGRAPHY N/A 04/19/2019   Procedure: LEFT HEART CATH AND CORONARY ANGIOGRAPHY;  Surgeon: Leonie Man, MD;  Location: [facility];  Service: Cardiovascular;  Laterality: N/A;  . TOTAL HIP ARTHROPLASTY Right 12/25/2015   Procedure: RIGHT TOTAL HIP ARTHROPLASTY ANTERIOR APPROACH;  Surgeon: Paralee Cancel, MD;  Location: [facility];  Service: Orthopedics;  Laterality: Right;  . TOTAL KNEE ARTHROPLASTY Left 11/22/2012   Procedure: LEFT TOTAL KNEE ARTHROPLASTY;  Surgeon: Mauri Pole, MD;  Location: [facility];  Service: Orthopedics;  Laterality: Left;  . TRANSURETHRAL RESECTION OF BLADDER TUMOR  03/14/2011   Procedure: TRANSURETHRAL RESECTION OF BLADDER TUMOR (TURBT);  Surgeon: Claybon Jabs, MD;  Location: [facility];  Service: Urology;  Laterality: N/A;  . TRANSURETHRAL RESECTION OF BLADDER TUMOR  05/09/2011   Procedure: TRANSURETHRAL RESECTION OF BLADDER TUMOR (TURBT);  Surgeon: Claybon Jabs, MD;  Location: [facility];  Service: Urology;  Laterality: N/A;  . TRANSURETHRAL RESECTION OF BLADDER TUMOR  03/08/2012   Procedure: TRANSURETHRAL RESECTION OF BLADDER TUMOR (TURBT);  Surgeon: Claybon Jabs, MD;  Location: [facility];  Service: Urology;  Laterality: N/A;  . TRANSURETHRAL RESECTION OF BLADDER TUMOR WITH GYRUS (TURBT-GYRUS) N/A 03/24/2014   Procedure: TRANSURETHRAL RESECTION OF BLADDER TUMOR WITH GYRUS (TURBT-GYRUS);  Surgeon: Claybon Jabs, MD;  Location[facility];  Service: Urology;  Laterality: N/A;  . UMBILICAL HERNIA REPAIR  2001  (approx)     Current Meds  Medication Sig  . acetaminophen (TYLENOL) 500 MG tablet Take 2 tablets (1,000 mg total) by mouth every 8 (eight) hours.  [name] amLODipine (NORVASC) 5 MG tablet Take 1 tablet (5 mg total) by mouth daily.  [name] apixaban (ELIQUIS) 5 MG TABS tablet Take 1 tablet (5 mg total) by mouth 2 (two) times daily.  [name] aspirin EC 81 MG EC tablet Take 1 tablet (81 mg total) by mouth daily.  . cholecalciferol (VITAMIN D) 1000 UNITS tablet Take 1,000 Units by mouth daily after breakfast.    . Co-Enzyme Q10 200 MG CAPS Take 200 mg by mouth daily after breakfast.   . digoxin (LANOXIN) 0.125 MG tablet TAKE 1 TABLET DAILY  . Evolocumab (REPATHA SURECLICK) 245 MG/ML SOAJ Inject 1 Dose into the skin every 14 (fourteen) days.  . Flaxseed, Linseed, (FLAX SEED OIL) 1000 MG CAPS Take 1,000 mg by mouth daily after breakfast.   . gabapentin (NEURONTIN) 100 MG capsule Take 1 capsule (100 mg total) by mouth at bedtime.  [name] GNP VITAMIN B-12 500 MCG tablet TAKE 1 TABLET DAILY  . levothyroxine (SYNTHROID) 50 MCG tablet Take 1 tablet (50 mcg total) by mouth daily.  . Magnesium 300 MG CAPS Take 300 mg by mouth at bedtime.   . metoprolol tartrate (LOPRESSOR) 50 MG tablet TAKE (1) TABLET TWICE A DAY.  . nitroGLYCERIN (NITROSTAT) 0.4 MG SL tablet PLACE 1 TABLET UNDER THE TONGUE AT ONSET OF CHEST PAIN EVERY 5 MINTUES UP TO 3 TIMES AS NEEDED  . traMADol (ULTRAM) 50  MG tablet Take 50 mg by mouth every 4 (four) hours as needed for moderate pain.   . TURMERIC PO Take 1 capsule by mouth daily after breakfast.     Allergies:   Fish allergy, Fish-derived products, Shellfish-derived products, Lasix [furosemide], Macrodantin [nitrofurantoin macrocrystal], Vicodin [hydrocodone-acetaminophen], Zetia [ezetimibe], Codeine, Latex, Niacin, Niaspan [niacin er], Statins, and Sulfa antibiotics   Social History   Tobacco Use  . Smoking status: Former Smoker    Packs/day: 0.50    Years: 5.00    Pack years: 2.50    Types: Cigarettes    Quit date: 03/09/1976    Years since quitting: 43.9  . Smokeless tobacco: Never Used  Vaping Use  . Vaping Use: Never used  Substance Use Topics  . Alcohol use: Not Currently    Comment: red wine  . Drug use: No     Family Hx: The patient's family history includes Coronary artery disease (age of onset: 15) in her father; Heart attack (age of onset: 45) in her daughter; Heart attack (age of onset: 45) in her mother; Heart disease (age of onset: 59) in her son; Hyperlipidemia in  her father, mother, and sister; Osteoporosis in her sister.  ROS:   Please see the history of present illness.     All other systems reviewed and are negative.   Prior CV studies:   The following studies were reviewed today:  None  Labs/Other Tests and Data Reviewed:    EKG:  No ECG reviewed.  Recent Labs: 04/27/2019: Magnesium 2.2 05/09/2019: BNP 393.1 08/12/2019: ALT 25; BUN 13; Creatinine, Ser 0.81; Hemoglobin 11.8; Platelets 248; Potassium 4.7; Sodium 139 12/12/2019: TSH 4.330   Recent Lipid Panel Lab Results  Component Value Date/Time   CHOL 144 08/12/2019 10:43 AM   TRIG 224 (H) 08/12/2019 10:43 AM   TRIG 414 (H) 07/31/2016 09:42 AM   HDL 46 08/12/2019 10:43 AM   HDL 49 07/31/2016 09:42 AM   CHOLHDL 3.1 08/12/2019 10:43 AM   CHOLHDL 3.8 04/16/2019 05:08 AM   LDLCALC 62 08/12/2019 10:43 AM   LDLCALC 205 (H) 11/28/2013 12:15 PM   LDLDIRECT 193 (H) 10/21/2016 09:02 AM    Wt Readings from Last 3 Encounters:  01/25/20 170 lb (77.1 kg)  12/09/19 175 lb 3.2 oz (79.5 kg)  10/21/19 173 lb 11.6 oz (78.8 kg)     Risk Assessment/Calculations:      Objective:    Vital Signs:  BP (!) 129/57   Pulse 70   Ht 5\' 3"  (1.6 m)   Wt 170 lb (77.1 kg)   BMI 30.11 kg/m    VITAL SIGNS:  reviewed  ASSESSMENT & PLAN:    Lower extremity edema:     She reports that this is a little bit more on the left since she sprained her ankle but otherwise unchanged.  We will continue with conservative therapy.   Coronary artery disease :    She was exercising before her injury and having no complaints.  No change in therapy.   Atrial fibrillation:     She is not having any paroxysms of fibrillation since stopping the amiodarone.  I like to see her back in the office before I stop her anticoagulation.  She is having no contraindication at this point.  She will continue on low-dose aspirin plus Eliquis.   Hyperlipidemia:     She had an excellent result with Repatha.  No change in therapy.    Essential hypertension:  Her blood pressure is at target.  No change in therapy.  er blood pressure is not at target and I am going to increase her amlodipine to 5 mg daily.   COVID-19 Education: The signs and symptoms of COVID-19 were discussed with the patient and how to seek care for testing (follow up with PCP or arrange E-visit).  The importance of social distancing was discussed today.  Time:   Today, I have spent 16 minutes with the patient with telehealth technology discussing the above problems.     Medication Adjustments/Labs and Tests Ordered: Current medicines are reviewed at length with the patient today.  Concerns regarding medicines are outlined above.   Tests Ordered: No orders of the defined types were placed in this encounter.   Medication Changes: No orders of the defined types were placed in this encounter.   Follow Up:  In Person In 2 months  Signed, Minus Breeding, MD  01/25/2020 12:43 PM    [place]

## 2020-01-24 NOTE — Telephone Encounter (Signed)
Spoke with pt, aware appointment changed to virtual telephone visit at her request.

## 2020-01-24 NOTE — Telephone Encounter (Signed)
Patient would like to know if she can change her appointment to virtual tomorrow, because she has a cold and sprained her ankle.

## 2020-01-25 ENCOUNTER — Telehealth (INDEPENDENT_AMBULATORY_CARE_PROVIDER_SITE_OTHER): Payer: Medicare Other | Admitting: Cardiology

## 2020-01-25 ENCOUNTER — Other Ambulatory Visit: Payer: Self-pay | Admitting: *Deleted

## 2020-01-25 ENCOUNTER — Encounter: Payer: Self-pay | Admitting: Cardiology

## 2020-01-25 VITALS — BP 129/57 | HR 70 | Ht 63.0 in | Wt 170.0 lb

## 2020-01-25 DIAGNOSIS — I2510 Atherosclerotic heart disease of native coronary artery without angina pectoris: Secondary | ICD-10-CM | POA: Diagnosis not present

## 2020-01-25 DIAGNOSIS — G6289 Other specified polyneuropathies: Secondary | ICD-10-CM

## 2020-01-25 DIAGNOSIS — E785 Hyperlipidemia, unspecified: Secondary | ICD-10-CM

## 2020-01-25 DIAGNOSIS — I10 Essential (primary) hypertension: Secondary | ICD-10-CM | POA: Diagnosis not present

## 2020-01-25 DIAGNOSIS — I480 Paroxysmal atrial fibrillation: Secondary | ICD-10-CM | POA: Diagnosis not present

## 2020-01-25 MED ORDER — GABAPENTIN 100 MG PO CAPS: 100.0000 mg | ORAL_CAPSULE | Freq: Every day | ORAL | 0 refills | Status: DC

## 2020-01-25 NOTE — Patient Instructions (Signed)
Medication Instructions:  °The current medical regimen is effective;  continue present plan and medications. ° °*If you need a refill on your cardiac medications before your next appointment, please call your pharmacy* ° °Follow-Up: °At [facility], you and your health needs are our priority.  As part of our continuing mission to provide you with exceptional heart care, we have created designated Provider Care Teams.  These Care Teams include your primary Cardiologist (physician) and Advanced Practice Providers (APPs -  Physician Assistants and Nurse Practitioners) who all work together to provide you with the care you need, when you need it. ° °We recommend signing up for the patient portal called "MyChart".  Sign up information is provided on this After Visit Summary.  MyChart is used to connect with patients for Virtual Visits (Telemedicine).  Patients are able to view lab/test results, encounter notes, upcoming appointments, etc.  Non-urgent messages can be sent to your provider as well.   °To learn more about what you can do with MyChart, go to [email or web address].   ° °Your next appointment:   °2 month(s) ° °The format for your next appointment:   °In Person ° °Provider:   °James Hochrein, MD{ ° ° °Thank you for choosing [facility]!! ° ° ° °

## 2020-01-27 ENCOUNTER — Encounter: Payer: Self-pay | Admitting: Family Medicine

## 2020-01-27 ENCOUNTER — Ambulatory Visit (INDEPENDENT_AMBULATORY_CARE_PROVIDER_SITE_OTHER): Payer: Medicare Other

## 2020-01-27 ENCOUNTER — Other Ambulatory Visit: Payer: Self-pay

## 2020-01-27 ENCOUNTER — Ambulatory Visit (INDEPENDENT_AMBULATORY_CARE_PROVIDER_SITE_OTHER): Payer: Medicare Other | Admitting: Family Medicine

## 2020-01-27 VITALS — BP 152/67 | HR 65 | Temp 98.1°F | Ht 63.0 in | Wt 181.0 lb

## 2020-01-27 DIAGNOSIS — S99922A Unspecified injury of left foot, initial encounter: Secondary | ICD-10-CM | POA: Diagnosis not present

## 2020-01-27 DIAGNOSIS — M79672 Pain in left foot: Secondary | ICD-10-CM

## 2020-01-27 DIAGNOSIS — Z780 Asymptomatic menopausal state: Secondary | ICD-10-CM

## 2020-01-27 DIAGNOSIS — S99912A Unspecified injury of left ankle, initial encounter: Secondary | ICD-10-CM | POA: Diagnosis not present

## 2020-01-27 DIAGNOSIS — M25572 Pain in left ankle and joints of left foot: Secondary | ICD-10-CM

## 2020-01-27 DIAGNOSIS — M8589 Other specified disorders of bone density and structure, multiple sites: Secondary | ICD-10-CM | POA: Diagnosis not present

## 2020-01-27 DIAGNOSIS — M85831 Other specified disorders of bone density and structure, right forearm: Secondary | ICD-10-CM | POA: Diagnosis not present

## 2020-01-27 DIAGNOSIS — M7989 Other specified soft tissue disorders: Secondary | ICD-10-CM | POA: Diagnosis not present

## 2020-01-27 MED ORDER — METHYLPREDNISOLONE ACETATE 80 MG/ML IJ SUSP
80.0000 mg | Freq: Once | INTRAMUSCULAR | Status: AC
  Administered 2020-01-27: 80 mg via INTRAMUSCULAR

## 2020-01-27 NOTE — Progress Notes (Signed)
Subjective: CC: ankle pain PCP: [name], DO  SEG:BTDV Gabriela Smith is a 79 y.o. female presenting to clinic today for:  1. Ankle pain Karman reports left ankle pain after an injury a month ago. She was walking on cobblestone with short heels on when she rolled her ankle. She did not fall. She reports the pain started about a week later. She has a history of peripheral edema so didn'Gabriela notice the swelling at first. The swelling has slowly gotten worse. She has been using a walker to ambulate due to the pain. She was not using a walker prior to the injury. The pain is below the ankle of the inside of her foot. The pain is an ache that occurs with weight baring or walking. Improves with rest. The pain is moderate. She takes tylenol at night for the pain. She wears compression stockings at times. She has a ankle brace that she wears sometimes. The swelling improves with elevation. She takes care of her husband is not able to rest often. She takes tylenol at night. Denies numbness or tingling in her foot. Denies changes in skin color. Denies fever, erythema, or warmth.   Relevant past medical, surgical, family, and social history reviewed and updated as indicated.  Allergies and medications reviewed and updated.  Allergies  Allergen Reactions  . Fish Allergy Hives, Shortness Of Breath and Other (See Comments)    "Bottom-feeder fish"  . Fish-Derived Products Hives, Shortness Of Breath and Other (See Comments)    Bottom-feeder fish  . Shellfish-Derived Products Hives and Shortness Of Breath  . Lasix [Furosemide] Swelling and Other (See Comments)    Edema, elevated BP  . Macrodantin [Nitrofurantoin Macrocrystal] Other (See Comments)    Unknown reaction   . Vicodin [Hydrocodone-Acetaminophen] Nausea And Vomiting and Other (See Comments)    SEVERE N/V  . Zetia [Ezetimibe] Other (See Comments)    Muscle aches  . Codeine Rash  . Latex Itching and Rash    Reports Elastic in underwear, under  wire bras, and now surgical cap cause rash and itching.   . Niacin Other (See Comments)    Muscle aches  . Niaspan [Niacin Er] Other (See Comments)    Muscle aches  . Statins Other (See Comments)    Muscle aches/ cramps  . Sulfa Antibiotics Rash and Other (See Comments)    Muscle aches/ cramps, also   Past Medical History:  Diagnosis Date  . AAA (abdominal aortic aneurysm) [facility])    cardiology aware   . Arthritis    OA lt knee- cortizone inj. q 4 months AND OA ALSO IN BACK. Had knee replaced-issue resolved  . CAD (coronary artery disease) CARDIOLOGIST- [facility]--  VISIT 01-02-10 IN EPIC   1993-- PTCA. Pt describes a near total blockage of apparently the LAD and a 65% stenosis elsewhere.  . Cancer ([place])   . History of bladder cancer followed by dr Karsten Ro   hx  TCC of bladder ,  Ta G3-  first occurence 02-20-2012--  s/p BCG tx's  and  mitomycin C  . Hyperlipidemia 1993  . Hypertension 1993  . Nocturia   . Osteopenia   . Sigmoid diverticulosis     Current Outpatient Medications:  .  acetaminophen (TYLENOL) 500 MG tablet, Take 2 tablets (1,000 mg total) by mouth every 8 (eight) hours., Disp: 30 tablet, Rfl: 0 .  amLODipine (NORVASC) 5 MG tablet, Take 1 tablet (5 mg total) by mouth daily., Disp: 90 tablet, Rfl: 3 .  apixaban (ELIQUIS) 5 MG TABS tablet, Take 1 tablet (5 mg total) by mouth 2 (two) times daily., Disp: 60 tablet, Rfl: 6 .  aspirin EC 81 MG EC tablet, Take 1 tablet (81 mg total) by mouth daily., Disp:  , Rfl:  .  cholecalciferol (VITAMIN D) 1000 UNITS tablet, Take 1,000 Units by mouth daily after breakfast. , Disp: , Rfl:  .  Co-Enzyme Q10 200 MG CAPS, Take 200 mg by mouth daily after breakfast. , Disp: , Rfl:  .  digoxin (LANOXIN) 0.125 MG tablet, TAKE 1 TABLET DAILY, Disp: 90 tablet, Rfl: 1 .  Evolocumab (REPATHA SURECLICK) 580 MG/ML SOAJ, Inject 1 Dose into the skin every 14 (fourteen) days., Disp: 2 pen, Rfl: 11 .  Flaxseed, Linseed, (FLAX SEED OIL) 1000 MG CAPS,  Take 1,000 mg by mouth daily after breakfast. , Disp: , Rfl:  .  gabapentin (NEURONTIN) 100 MG capsule, Take 1 capsule (100 mg total) by mouth at bedtime., Disp: 90 capsule, Rfl: 0 .  GNP VITAMIN B-12 500 MCG tablet, TAKE 1 TABLET DAILY, Disp: 90 tablet, Rfl: 1 .  levothyroxine (SYNTHROID) 50 MCG tablet, Take 1 tablet (50 mcg total) by mouth daily., Disp: 90 tablet, Rfl: 3 .  Magnesium 300 MG CAPS, Take 300 mg by mouth at bedtime. , Disp: , Rfl:  .  metoprolol tartrate (LOPRESSOR) 50 MG tablet, TAKE (1) TABLET TWICE A DAY., Disp: 180 tablet, Rfl: 3 .  nitroGLYCERIN (NITROSTAT) 0.4 MG SL tablet, PLACE 1 TABLET UNDER THE TONGUE AT ONSET OF CHEST PAIN EVERY 5 MINTUES UP TO 3 TIMES AS NEEDED, Disp: 25 tablet, Rfl: 0 .  traMADol (ULTRAM) 50 MG tablet, Take 50 mg by mouth every 4 (four) hours as needed for moderate pain. , Disp: , Rfl:  .  TURMERIC PO, Take 1 capsule by mouth daily after breakfast., Disp: , Rfl:  No current facility-administered medications for this visit.  Facility-Administered Medications Ordered in Other Visits:  .  DOBUTamine (DOBUTREX) 1,000 mcg/mL in dextrose 5% 250 mL infusion, 20 mcg/kg/min, Intravenous, Continuous, Dorothy Spark, MD, Last Rate: 96.4 mL/hr at 12/24/15 0840, 20 mcg/kg/min at 12/24/15 0840 Social History   Socioeconomic History  . Marital status: Married    Spouse name: Not on file  . Number of children: 3  . Years of education: 89  . Highest education level: High school graduate  Occupational History  . Occupation: retired  Tobacco Use  . Smoking status: Former Smoker    Packs/day: 0.50    Years: 5.00    Pack years: 2.50    Types: Cigarettes    Quit date: 03/09/1976    Years since quitting: 43.9  . Smokeless tobacco: Never Used  Vaping Use  . Vaping Use: Never used  Substance and Sexual Activity  . Alcohol use: Not Currently    Comment: red wine  . Drug use: No  . Sexual activity: Not Currently  Other Topics Concern  . Not on file   Social History Narrative   Married with 3 children and 4 grandchildren.  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 with Parkinsons   Social Determinants of Health   Financial Resource Strain: Low Risk   . Difficulty of Paying Living Expenses: Not hard at all  Food Insecurity: No Food Insecurity  . Worried About Charity fundraiser in the Last Year: Never true  . Ran Out of Food in the Last Year: Never true  Transportation Needs: No Transportation Needs  . Lack of Transportation (Medical): No  .  Lack of Transportation (Non-Medical): No  Physical Activity: Insufficiently Active  . Days of Exercise per Week: 3 days  . Minutes of Exercise per Session: 30 min  Stress: No Stress Concern Present  . Feeling of Stress : Not at all  Social Connections: Socially Integrated  . Frequency of Communication with Friends and Family: More than three times a week  . Frequency of Social Gatherings with Friends and Family: More than three times a week  . Attends Religious Services: More than 4 times per year  . Active Member of Clubs or Organizations: Yes  . Attends Archivist Meetings: 1 to 4 times per year  . Marital Status: Married  Human resources officer Violence: Not At Risk  . Fear of Current or Ex-Partner: No  . Emotionally Abused: No  . Physically Abused: No  . Sexually Abused: No   Family History  Problem Relation Age of Onset  . Coronary artery disease Father 37  . Hyperlipidemia Father   . Heart attack Mother 37  . Hyperlipidemia Mother   . Osteoporosis Sister   . Hyperlipidemia Sister   . Heart attack Daughter 23       CABG x 5  . Heart disease Son 78       Stents x 3    Review of Systems  Per HPI.  Objective: Office vital signs reviewed. BP (!) 152/67   Pulse 65   Temp 98.1 F (36.7 C) (Temporal)   Ht 5\' 3"  (1.6 m)   Wt 181 lb (82.1 kg)   BMI 32.06 kg/m   Physical Examination:  Physical Exam Vitals and nursing note reviewed.  Constitutional:      General: She is not in acute  distress.    Appearance: She is not ill-appearing, toxic-appearing or diaphoretic.  Pulmonary:     Effort: Pulmonary effort is normal. No respiratory distress.  Musculoskeletal:     Right lower leg: No tenderness. 2+ Edema present.     Left lower leg: No tenderness. 2+ Edema present.     Left ankle: Swelling present. No deformity or ecchymosis. No tenderness. Normal range of motion.     Left foot: Normal range of motion and normal capillary refill. Swelling present. Normal pulse.       Feet:  Neurological:     General: No focal deficit present.     Mental Status: She is alert and oriented to person, place, and time.     Motor: No weakness.     Gait: Gait abnormal (using walker).  Psychiatric:        Mood and Affect: Mood normal.        Behavior: Behavior normal.      Results for orders placed or performed in visit on 12/09/19  Thyroid Panel With TSH  Result Value Ref Range   TSH 4.330 0.450 - 4.500 uIU/mL   T4, Total 7.4 4.5 - 12.0 ug/dL   T3 Uptake Ratio 29 24 - 39 %   Free Thyroxine Index 2.1 1.2 - 4.9     Assessment/ Plan: Gabriela Smith was seen today for ankle pain.  Diagnoses and all orders for this visit:  Acute left ankle pain/Acute pain of left foot Radiology reports pending, will notify patient of results. Tylenol during the day as need since that is when her pain is worse. Rest, elevation as able. Heat, ice as needed. Wear ankle brace with ambulation. Steroid IM injection today in the office. Discussed ortho referral if fracture is found of Xray-she would  prefer an [place] location. Discussed PT referral if no fracture. She is agreeable. Return to office for new or worsening symptoms, or if symptoms persist.  -     DG Ankle Complete Left -     methylPREDNISolone acetate (DEPO-MEDROL) injection 80 mg -     DG Foot Complete Left  Follow up as needed.   The above assessment and management plan was discussed with the patient. The patient verbalized understanding of and has  agreed to the management plan. Patient is aware to call the clinic if symptoms persist or worsen. Patient is aware when to return to the clinic for a follow-up visit. Patient educated on when it is appropriate to go to the emergency department.   Marjorie Smolder, FNP-C [facility] [address] [place], [place] [address] [phone]

## 2020-01-27 NOTE — Patient Instructions (Signed)
Ankle Sprain  An ankle sprain is a stretch or tear in a ligament in the ankle. Ligaments are tissues that connect bones to each other. The two most common types of ankle sprains are:  Inversion sprain. This happens when the foot turns inward and the ankle rolls outward. It affects the ligament on the outside of the foot (lateral ligament).  Eversion sprain. This happens when the foot turns outward and the ankle rolls inward. It affects the ligament on the inner side of the foot (medial ligament). What are the causes? This condition is often caused by accidentally rolling or twisting the ankle. What increases the risk? You are more likely to develop this condition if you play sports. What are the signs or symptoms? Symptoms of this condition include:  Pain in your ankle.  Swelling.  Bruising. This may develop right after you sprain your ankle or 1-2 days later.  Trouble standing or walking, especially when you turn or change directions. How is this diagnosed? This condition is diagnosed with:  A physical exam. During the exam, your health care provider will press on certain parts of your foot and ankle and try to move them in certain ways.  X-ray imaging. These may be taken to see how severe the sprain is and to check for broken bones. How is this treated? This condition may be treated with:  A brace or splint. This is used to keep the ankle from moving until it heals.  An elastic bandage. This is used to support the ankle.  Crutches.  Pain medicine.  Surgery. This may be needed if the sprain is severe.  Physical therapy. This may help to improve the range of motion in the ankle. Follow these instructions at home: If you have a brace or a splint:  Wear the brace or splint as told by your health care provider. Remove it only as told by your health care provider.  Loosen the brace or splint if your toes tingle, become numb, or turn cold and blue.  Keep the brace or  splint clean.  If the brace or splint is not waterproof: ? Do not let it get wet. ? Cover it with a watertight covering when you take a bath or a shower. If you have an elastic bandage (dressing):  Remove it to shower or bathe.  Try not to move your ankle much, but wiggle your toes from time to time. This helps to prevent swelling.  Adjust the dressing to make it more comfortable if it feels too tight.  Loosen the dressing if you have numbness or tingling in your foot, or if your foot becomes cold and blue. Managing pain, stiffness, and swelling   Take over-the-counter and prescription medicines only as told by your health care provider.  For 2-3 days, keep your ankle raised (elevated) above the level of your heart as much as possible.  If directed, put ice on the injured area: ? If you have a removable brace or splint, remove it as told by your health care provider. ? Put ice in a plastic bag. ? Place a towel between your skin and the bag. ? Leave the ice on for 20 minutes, 2-3 times a day. General instructions  Rest your ankle.  Do not use the injured limb to support your body weight until your health care provider says that you can. Use crutches as told by your health care provider.  Do not use any products that contain nicotine or tobacco, such as  cigarettes, e-cigarettes, and chewing tobacco. If you need help quitting, ask your health care provider.  Keep all follow-up visits as told by your health care provider. This is important. Contact a health care provider if:  You have rapidly increasing bruising or swelling.  Your pain is not relieved with medicine. Get help right away if:  Your foot or toes become numb or blue.  You have severe pain that gets worse. Summary  An ankle sprain is a stretch or tear in a ligament in the ankle. Ligaments are tissues that connect bones to each other.  This condition is often caused by accidentally rolling or twisting the  ankle.  Symptoms include pain, swelling, bruising, and trouble walking.  To relieve pain and swelling, put ice on the affected ankle, raise your ankle above the level of your heart, and use an elastic bandage.  Keep all follow-up visits as told by your health care provider. This is important. This information is not intended to replace advice given to you by your health care provider. Make sure you discuss any questions you have with your health care provider. Document Revised: 11/16/2017 Document Reviewed: 07/21/2017 Elsevier Patient Education  [place].

## 2020-01-30 ENCOUNTER — Other Ambulatory Visit: Payer: Self-pay | Admitting: Family Medicine

## 2020-01-30 DIAGNOSIS — M79672 Pain in left foot: Secondary | ICD-10-CM

## 2020-01-30 DIAGNOSIS — M25572 Pain in left ankle and joints of left foot: Secondary | ICD-10-CM

## 2020-02-07 ENCOUNTER — Ambulatory Visit: Payer: Medicare Other | Attending: Family Medicine | Admitting: Physical Therapy

## 2020-02-07 ENCOUNTER — Encounter: Payer: Self-pay | Admitting: Physical Therapy

## 2020-02-07 ENCOUNTER — Other Ambulatory Visit: Payer: Self-pay

## 2020-02-07 DIAGNOSIS — M25672 Stiffness of left ankle, not elsewhere classified: Secondary | ICD-10-CM

## 2020-02-07 DIAGNOSIS — R600 Localized edema: Secondary | ICD-10-CM | POA: Diagnosis not present

## 2020-02-07 DIAGNOSIS — M25572 Pain in left ankle and joints of left foot: Secondary | ICD-10-CM

## 2020-02-07 DIAGNOSIS — M6281 Muscle weakness (generalized): Secondary | ICD-10-CM | POA: Diagnosis not present

## 2020-02-07 NOTE — Patient Instructions (Signed)
Access Code: [email or web address] URL: [email or web address] Date: 02/07/2020 Prepared by: Gabriela Eves  Exercises Towel Scrunches - 1 x daily - 7 x weekly - 2 sets - 10 reps Ankle Inversion Eversion Towel Slide - 1 x daily - 7 x weekly - 2 sets - 10 reps Ankle Inversion Eversion Towel Slide - 1 x daily - 7 x weekly - 3 sets - 10 reps Long Sitting Ankle Pumps - 1 x daily - 7 x weekly - 3 sets - 10 reps

## 2020-02-07 NOTE — Therapy (Signed)
[facility] [place], [place], [address] Phone: [phone]   Fax:  [phone]  Physical Therapy Evaluation  Patient Details  Name: Gabriela Smith MRN: [id number] Date of Birth: 10-30-1940 Referring Provider (PT): Marjorie Smolder, FNP   Encounter Date: 02/07/2020   PT End of Session - 02/07/20 1250    Visit Number 1    Number of Visits 6    Date for PT Re-Evaluation 03/27/20    Authorization Type [employer] Medicare ([place] modifier); Progress note every 10th visit    PT Start Time 1118    PT Stop Time 1205    PT Time Calculation (min) 47 min    Equipment Utilized During Treatment --   SPC   Activity Tolerance Patient tolerated treatment well    Behavior During Therapy WFL for tasks assessed/performed           Past Medical History:  Diagnosis Date  . AAA (abdominal aortic aneurysm) [facility])    cardiology aware   . Arthritis    OA lt knee- cortizone inj. q 4 months AND OA ALSO IN BACK. Had knee replaced-issue resolved  . CAD (coronary artery disease) CARDIOLOGIST- [facility]--  VISIT 01-02-10 IN EPIC   1993-- PTCA. Pt describes a near total blockage of apparently the LAD and a 65% stenosis elsewhere.  . Cancer ([place])   . History of bladder cancer followed by dr Karsten Ro   hx  TCC of bladder ,  Ta G3-  first occurence 02-20-2012--  s/p BCG tx's  and  mitomycin C  . Hyperlipidemia 1993  . Hypertension 1993  . Nocturia   . Osteopenia   . Sigmoid diverticulosis     Past Surgical History:  Procedure Laterality Date  . CHOLECYSTECTOMY  2003 (approx)  . CLIPPING OF ATRIAL APPENDAGE N/A 04/25/2019   Procedure: Clipping Of Atrial Appendage;  Surgeon: Lajuana Matte, MD;  Location: [place];  Service: Open Heart Surgery;  Laterality: N/A;  . COLONOSCOPY  05-31-2003  . CORONARY ANGIOPLASTY  1993   to LAD  . CORONARY ARTERY BYPASS GRAFT N/A 04/25/2019   Procedure: CORONARY ARTERY BYPASS GRAFTING (CABG) times four  using left internal mammary artery and left greater saphenous vein harvested endoscipically.;  Surgeon: Lajuana Matte, MD;  Location: [place];  Service: Open Heart Surgery;  Laterality: N/A;  . CYSTOSCOPY WITH BIOPSY  05/09/2011   Procedure: CYSTOSCOPY WITH BIOPSY;  Surgeon: Claybon Jabs, MD;  Location: [facility];  Service: Urology;  Laterality: N/A;  WITH BLADDER BIOPSY GYRUS  . LEFT HEART CATH AND CORONARY ANGIOGRAPHY N/A 04/19/2019   Procedure: LEFT HEART CATH AND CORONARY ANGIOGRAPHY;  Surgeon: Leonie Man, MD;  Location: [place] [facility];  Service: Cardiovascular;  Laterality: N/A;  . TOTAL HIP ARTHROPLASTY Right 12/25/2015   Procedure: RIGHT TOTAL HIP ARTHROPLASTY ANTERIOR APPROACH;  Surgeon: Paralee Cancel, MD;  Location: [facility];  Service: Orthopedics;  Laterality: Right;  . TOTAL KNEE ARTHROPLASTY Left 11/22/2012   Procedure: LEFT TOTAL KNEE ARTHROPLASTY;  Surgeon: Mauri Pole, MD;  Location: [facility];  Service: Orthopedics;  Laterality: Left;  . TRANSURETHRAL RESECTION OF BLADDER TUMOR  03/14/2011   Procedure: TRANSURETHRAL RESECTION OF BLADDER TUMOR (TURBT);  Surgeon: Claybon Jabs, MD;  Location: [facility];  Service: Urology;  Laterality: N/A;  . TRANSURETHRAL RESECTION OF BLADDER TUMOR  05/09/2011   Procedure: TRANSURETHRAL RESECTION OF BLADDER TUMOR (TURBT);  Surgeon: Claybon Jabs, MD;  Location: [place];  Service: Urology;  Laterality: N/A;  . TRANSURETHRAL RESECTION OF BLADDER TUMOR  03/08/2012   Procedure: TRANSURETHRAL RESECTION OF BLADDER TUMOR (TURBT);  Surgeon: Claybon Jabs, MD;  Location: [facility];  Service: Urology;  Laterality: N/A;  . TRANSURETHRAL RESECTION OF BLADDER TUMOR WITH GYRUS (TURBT-GYRUS) N/A 03/24/2014   Procedure: TRANSURETHRAL RESECTION OF BLADDER TUMOR WITH GYRUS (TURBT-GYRUS);  Surgeon: Claybon Jabs, MD;  Location: [facility];  Service: Urology;  Laterality: N/A;   . UMBILICAL HERNIA REPAIR  2001  (approx)    There were no vitals filed for this visit.    Subjective Assessment - 02/07/20 1242    Subjective COVID-19 screening performed upon arrival. Patient arrives to physical therapy with reports of left ankle pain secondary to left eversion ankle sprain on 12/31/2019. Patient reported stepping off of the patio onto a stepping stone when her foot turned outward. Patient denied a fall and denied pain at the time but with pain one week after. Patient able to perform all ADLs and home activities independently but with increased time. Patient ambulates in the community with a SPC but at home ambulates without an AD and holds onto furniture. Patient's reports pain at worst as 10/10 and pain at best as 0/10 with pain medication, rest, and tylenol. Patient's goals are to decrease pain, improve movement, improve walking, and return to perform ADLs and caregiving activities for her husband.    Pertinent History Latex allergy, sulfa drug allergy, history of CABG, osteopenia, R THA, L TKA    Limitations Standing;Walking;House hold activities    Diagnostic tests X-Ray: (-) fracture    Patient Stated Goals get back to moving and doing everything before injury    Currently in Pain? Yes   moderate   Pain Location Ankle    Pain Orientation Left;Medial    Pain Descriptors / Indicators Discomfort    Pain Type Acute pain    Pain Onset 1 to 4 weeks ago    Pain Frequency Intermittent    Aggravating Factors  walking, standing on it    Pain Relieving Factors tylenol, rest and elevation    Effect of Pain on Daily Activities able to perform but with increased time              [facility] PT Assessment - 02/07/20 0001      Assessment   Medical Diagnosis Acute left ankle pain, Acute pain of left foot    Referring Provider (PT) Marjorie Smolder, FNP    Onset Date/Surgical Date 12/31/19    Next MD Visit unsure    Prior Therapy no      Precautions   Precautions None       Restrictions   Weight Bearing Restrictions No      Balance Screen   Has the patient fallen in the past 6 months No    Has the patient had a decrease in activity level because of a fear of falling?  Yes    Is the patient reluctant to leave their home because of a fear of falling?  No      Home Environment   Living Environment Private residence    Living Arrangements Spouse/significant other    Home Access Level entry    [place] Two level    Additional Comments chair lift to second floor      Prior Function   Level of Independence Independent      Observation/Other Assessments-Edema    Edema  Figure 8      Figure 8 Edema   Figure 8 - Right  48.0 cm    Figure 8 - Left  49.4cm      ROM / Strength   AROM / PROM / Strength AROM;PROM;Strength      AROM   AROM Assessment Site Ankle    Right/Left Ankle Left    Left Ankle Dorsiflexion 4   12 knee flexed   Left Ankle Plantar Flexion 46   (+) pain   Left Ankle Inversion 14    Left Ankle Eversion 10      PROM   PROM Assessment Site Ankle    Right/Left Ankle Left    Left Ankle Dorsiflexion 12   16   Left Ankle Plantar Flexion 50    Left Ankle Inversion 28    Left Ankle Eversion 16      Strength   Strength Assessment Site Ankle    Right/Left Ankle Left    Left Ankle Dorsiflexion 4-/5    Left Ankle Plantar Flexion 4/5    Left Ankle Inversion 3-/5    Left Ankle Eversion 3-/5      Palpation   Palpation comment tenderness to left deltoid ligaments      Ambulation/Gait   Assistive device Straight cane    Gait Pattern Step-through pattern;Decreased stance time - right;Decreased stride length;Decreased dorsiflexion - right;Decreased weight shift to right;Antalgic    Ambulation Surface Level;Indoor      Balance   Balance Assessed Yes      Static Standing Balance   Static Standing Balance -  Activities  Single Leg Stance - Right Leg;Tandam Stance - Right Leg;Tandam Stance - Left Leg;Romberg - Eyes Opened    Static Standing  - Comment/# of Minutes 30 seconds for NBOS and tandem stance; less than 10 seconds  for R SLS                      Objective measurements completed on examination: See above findings.       [facility] Adult PT Treatment/Exercise - 02/07/20 0001      Modalities   Modalities Electrical Stimulation      Electrical Stimulation   Electrical Stimulation Location left medial ankle     Electrical Stimulation Action pre-mod    Electrical Stimulation Parameters 80-150 hz x10 mins    Electrical Stimulation Goals Pain;Edema                  PT Education - 02/07/20 1249    Education Details seated ankle pumps, inversion slides, eversion slides, toe curls    Person(s) Educated Patient    Methods Explanation;Demonstration;Handout    Comprehension Verbalized understanding;Returned demonstration               PT Long Term Goals - 02/07/20 1256      PT LONG TERM GOAL #1   Title Patient will be independent with HEP and its progression.    Time 6    Period Weeks    Status New      PT LONG TERM GOAL #2   Title Patient will report ability to perform ADLs with left ankle pain less than or equal to 3/10.    Time 6    Period Weeks    Status New      PT LONG TERM GOAL #3   Title Patient will demonstrate 4/55  left ankle MMT to improve stability during functional tasks.    Time 6  Period Weeks    Status New      PT LONG TERM GOAL #4   Title Patient will ambulate with minimal gait deviations and LRAD or no AD with left ankle pain less than or equal to 3/10 to safely access community.    Time 6    Period Weeks    Status New                  Plan - 02/07/20 1251    Clinical Impression Statement Patient is an 79 year old female who presents to physical therapy with decreased left ankle ROM, decreased left MMT, and increased edema secondary a left ankle eversion sprain. Patient very tender to palpation to left deltoid ligaments. Patient noted with decreased  left weight bearing in static stance. Patient ambulates with SPC with decreased L stance time, decreased R step length, decreased stride length and decreased left weight shifting. Patient and PT discussed plan of care and HEP to which patient reported understanding. Patient to attend PT 1x/week secondary being her husband's caretaker. Patient educated on emphasis on HEP to maximize PT benefit. Patient reported understanding. Patient would benefit from skilled physical therapy to address deficits and address patient's goals.    Personal Factors and Comorbidities Age;Time since onset of injury/illness/exacerbation;Comorbidity 2    Comorbidities Latex allergy, sulfa drug allergy, history of CABG, osteopenia, R THA, L TKA    Examination-Activity Limitations Locomotion Level;Stand;Caring for Others    Stability/Clinical Decision Making Stable/Uncomplicated    Clinical Decision Making Low    Rehab Potential Good    PT Frequency 1x / week    PT Duration 6 weeks    PT Treatment/Interventions ADLs/Self Care Home Management;Cryotherapy;Electrical Stimulation;Moist Heat;Ultrasound;Gait training;Stair training;Functional mobility training;Therapeutic activities;Therapeutic exercise;Balance training;Neuromuscular re-education;Manual techniques;Passive range of motion;Patient/family education;Vasopneumatic Device;Taping    PT Next Visit Plan nustep, left ankle ROM and strengthening, incorporate balance activities, modalities PRN for pain relief and edema control    PT Home Exercise Plan see patient education section    Consulted and Agree with Plan of Care Patient           Patient will benefit from skilled therapeutic intervention in order to improve the following deficits and impairments:  Abnormal gait, Decreased range of motion, Difficulty walking, Decreased activity tolerance, Decreased balance, Decreased strength, Increased edema, Pain  Visit Diagnosis: Pain in left ankle and joints of left foot - Plan:  PT plan of care cert/re-cert  Stiffness of left ankle, not elsewhere classified - Plan: PT plan of care cert/re-cert  Localized edema - Plan: PT plan of care cert/re-cert  Muscle weakness (generalized) - Plan: PT plan of care cert/re-cert     Problem List Patient Active Problem List   Diagnosis Date Noted  . Paroxysmal A-fib ([place]) 08/12/2019  . S/P CABG x 4 04/26/2019  . Acute CHF (congestive heart failure) ([place]) 04/15/2019  . Disease related peripheral neuropathy 03/07/2019  . BMI 33.0-33.9,adult 02/17/2019  . Intermittent claudication ([place]) 02/17/2019  . Peripheral vascular insufficiency ([place]) 02/17/2019  . Aortic atherosclerosis ([place]) 02/11/2017  . Atrophic vaginitis 09/18/2016  . S/P right THA, AA 12/25/2015  . S/P hip replacement 12/25/2015  . AAA (abdominal aortic aneurysm) without rupture ([place]) 08/24/2015  . Statin intolerance 05/23/2013  . Osteopenia 04/20/2013  . Degenerative disc disease, lumbar 02/08/2013  . Vitamin D deficiency 02/08/2013  . Overweight (BMI 25.0-29.9) 11/24/2012  . S/P left TKA 11/22/2012  . Malignant neoplasm of urinary bladder ([place]) 11/04/2012  . Osteoarthritis of left knee  11/04/2012  . Hyperlipidemia LDL goal <70 11/28/2008  . Hypertension 11/28/2008  . CORONARY ATHEROSCLEROSIS NATIVE CORONARY ARTERY 11/28/2008    Gabriela Eves, PT, DPT 02/07/2020, 1:07 PM  [facility] [place], [place], [address] Phone: [phone]   Fax:  [phone]  Name: KESSA FAIRBAIRN MRN: [id number] Date of Birth: 03-Jun-1940

## 2020-02-13 ENCOUNTER — Other Ambulatory Visit: Payer: Self-pay | Admitting: Cardiology

## 2020-02-15 ENCOUNTER — Ambulatory Visit: Payer: Medicare Other | Admitting: Physical Therapy

## 2020-02-16 ENCOUNTER — Telehealth: Payer: Self-pay | Admitting: *Deleted

## 2020-02-16 ENCOUNTER — Ambulatory Visit: Payer: Medicare Other | Admitting: Physical Therapy

## 2020-02-16 NOTE — Telephone Encounter (Signed)
Paperwork has been faxed to [facility]

## 2020-02-16 NOTE — Telephone Encounter (Signed)
Received [employer] forms that were dropped off at the [place] office.  Pt informed staff there she was instructed to bring the forms, drop them off and have them sent to [place].  I did have Dr Percival Spanish go ahead and fill out and sign/date the providers portion of the paperwork.  Of note - pt did not leave any financial, insurance information,proof of income, household size or out-of-pocket prescription expenses with the forms.  Will scan what was left in the [place] office to [place] as requested.

## 2020-02-17 ENCOUNTER — Telehealth: Payer: Medicare Other

## 2020-02-21 ENCOUNTER — Telehealth: Payer: Self-pay | Admitting: Internal Medicine

## 2020-02-21 NOTE — Telephone Encounter (Signed)
PA for repatha submitted via CMM (Key: [id number])

## 2020-02-24 ENCOUNTER — Telehealth: Payer: Medicare Other

## 2020-02-27 NOTE — Telephone Encounter (Signed)
From fax 02/21/2021 - repatha is approved thru 03/09/2021

## 2020-03-06 ENCOUNTER — Telehealth: Payer: Medicare Other | Admitting: *Deleted

## 2020-03-13 ENCOUNTER — Telehealth: Payer: Self-pay | Admitting: *Deleted

## 2020-03-13 ENCOUNTER — Other Ambulatory Visit: Payer: Self-pay | Admitting: Family Medicine

## 2020-03-13 DIAGNOSIS — G6289 Other specified polyneuropathies: Secondary | ICD-10-CM

## 2020-03-13 MED ORDER — GABAPENTIN 100 MG PO CAPS: 200.0000 mg | ORAL_CAPSULE | Freq: Every day | ORAL | 0 refills | Status: DC

## 2020-03-13 NOTE — Telephone Encounter (Signed)
Fax from [facility] pharmacy RE: Gabapentin 100 mg 1 QHS Note from pharmacy: pt says dose was increased to 2 daily Please advise and send new Rx if appropriate

## 2020-03-28 ENCOUNTER — Ambulatory Visit: Payer: Medicare Other | Admitting: Cardiology

## 2020-04-02 ENCOUNTER — Other Ambulatory Visit: Payer: Self-pay | Admitting: Cardiology

## 2020-04-02 ENCOUNTER — Ambulatory Visit: Payer: Medicare Other | Admitting: Licensed Clinical Social Worker

## 2020-04-02 ENCOUNTER — Other Ambulatory Visit: Payer: Self-pay

## 2020-04-02 DIAGNOSIS — I714 Abdominal aortic aneurysm, without rupture, unspecified: Secondary | ICD-10-CM

## 2020-04-02 DIAGNOSIS — E785 Hyperlipidemia, unspecified: Secondary | ICD-10-CM

## 2020-04-02 DIAGNOSIS — E559 Vitamin D deficiency, unspecified: Secondary | ICD-10-CM

## 2020-04-02 DIAGNOSIS — I10 Essential (primary) hypertension: Secondary | ICD-10-CM

## 2020-04-02 DIAGNOSIS — I5031 Acute diastolic (congestive) heart failure: Secondary | ICD-10-CM

## 2020-04-02 DIAGNOSIS — Z951 Presence of aortocoronary bypass graft: Secondary | ICD-10-CM

## 2020-04-02 DIAGNOSIS — M5136 Other intervertebral disc degeneration, lumbar region: Secondary | ICD-10-CM

## 2020-04-02 MED ORDER — DIGOXIN 125 MCG PO TABS
125.0000 ug | ORAL_TABLET | Freq: Every day | ORAL | 1 refills | Status: DC
Start: 2020-04-02 — End: 2020-09-27

## 2020-04-02 NOTE — Chronic Care Management (AMB) (Signed)
Chronic Care Management    Clinical Social Work Follow Up Note  04/02/2020 Name: Gabriela Smith MRN: [id number] DOB: May 06, 1940  Gabriela Smith is a 80 y.o. year old female who is a primary care patient of [name], DO. The CCM team was consulted for assistance with [employer] .   Review of patient status, including review of consultants reports, other relevant assessments, and collaboration with appropriate care team members and the patient's provider was performed as part of comprehensive patient evaluation and provision of chronic care management services.    SDOH (Social Determinants of Health) assessments performed: No; risk for tobacco use; risk for depression; risk for stress; risk for physical inactivity  [place] from 10/21/2019 in [place]  PHQ-9 Total Score 0     GAD 7 : Generalized Anxiety Score 07/06/2019  Nervous, Anxious, on Edge 0  Control/stop worrying 0  Worry too much - different things 0  Trouble relaxing 0  Restless 1  Easily annoyed or irritable 0  Afraid - awful might happen 0  Total GAD 7 Score 1  Anxiety Difficulty Not difficult at all    Outpatient Encounter Medications as of 04/02/2020  Medication Sig  . acetaminophen (TYLENOL) 500 MG tablet Take 2 tablets (1,000 mg total) by mouth every 8 (eight) hours.  [name] amLODipine (NORVASC) 5 MG tablet Take 1 tablet (5 mg total) by mouth daily.  [name] apixaban (ELIQUIS) 5 MG TABS tablet Take 1 tablet (5 mg total) by mouth 2 (two) times daily.  [name] aspirin EC 81 MG EC tablet Take 1 tablet (81 mg total) by mouth daily.  . cholecalciferol (VITAMIN D) 1000 UNITS tablet Take 1,000 Units by mouth daily after breakfast.   . Co-Enzyme Q10 200 MG CAPS Take 200 mg by mouth daily after breakfast.   . digoxin (LANOXIN) 0.125 MG tablet TAKE 1 TABLET DAILY  . Evolocumab (REPATHA SURECLICK) 130 MG/ML SOAJ Inject 1 Dose into the skin every 14 (fourteen) days.   . Flaxseed, Linseed, (FLAX SEED OIL) 1000 MG CAPS Take 1,000 mg by mouth daily after breakfast.   . gabapentin (NEURONTIN) 100 MG capsule Take 2 capsules (200 mg total) by mouth at bedtime.  [name] GNP VITAMIN B-12 500 MCG tablet TAKE 1 TABLET DAILY  . levothyroxine (SYNTHROID) 50 MCG tablet Take 1 tablet (50 mcg total) by mouth daily.  . Magnesium 300 MG CAPS Take 300 mg by mouth at bedtime.   . metoprolol tartrate (LOPRESSOR) 50 MG tablet TAKE (1) TABLET TWICE A DAY.  . nitroGLYCERIN (NITROSTAT) 0.4 MG SL tablet PLACE 1 TABLET UNDER THE TONGUE AT ONSET OF CHEST PAIN EVERY 5 MINTUES UP TO 3 TIMES AS NEEDED  . traMADol (ULTRAM) 50 MG tablet Take 50 mg by mouth every 4 (four) hours as needed for moderate pain.   . TURMERIC PO Take 1 capsule by mouth daily after breakfast.   Facility-Administered Encounter Medications as of 04/02/2020  Medication  . DOBUTamine (DOBUTREX) 1,000 mcg/mL in dextrose 5% 250 mL infusion    Goals    .  Client will talk with LCSW in next 30 days to discuss client completion of ADLs and dialy tasks (pt-stated)      CARE PLAN ENTRY (see longitudinal plan of care for additional care plan information)  Current Barriers:  [name] Mobility challenges in client with Chronic Diagnoses of CHF, HLD, HTN, OA, DDD, Vitamin D deficiency, AAA, s/p CABG X 4 . Short of  breath on occasion   Clinical Social Work Clinical Goal(s):  [name] LCSW to call client in next 30 days to tidsucs client completion of ADLs and daily activities  Interventions: . Talked with client about CCM program support . Talked with client about her mobility challenges . Talked with client about pain issues (pain in her feet) . Taked with clinet about her breathing challenges (occasional short of breath) . Talked with client about her heart surgery (heart surgery in February of 2021) . Talked with client aobut her upcoming client appointments . Talked with Letta Median about relaxation techniques of choice Alen likes to cook  , she likes to listen to music, and likes to read) . Provided counseling support for client  . Talked with client about transport needs of client . Talked with client about health needs of her spouse . Talked with client about meal provision of client . Talked with client about family support . Talked with client about vision of client . Talked with client about upcoming cardiology appointment in February of 2022. . Talked with client about mood of client  Patient Self Care Activities:  Takes medications as prescribed Attends scheduled medical appointments  . Patient Self Care Deficits:  [name] Mobility challenges . Shortness of breath occasionally   Initial goal documentation     Follow Up Plan: LCSW to call client in next 4 weeks to talk with her about her completion of daily ADLs and daily activities  [name].Danniella Robben MSW, LCSW Licensed Clinical Social Worker [facility] Care Management [phone]

## 2020-04-02 NOTE — Patient Instructions (Addendum)
Licensed Clinical Social Worker Visit Information  Goals we discussed today:  .  Client will talk with LCSW in next 30 days to discuss client completion of ADLs and dialy tasks (pt-stated)        CARE PLAN ENTRY (see longitudinal plan of care for additional care plan information)  Current Barriers:   Mobility challenges in client with Chronic Diagnoses of CHF, HLD, HTN, OA, DDD, Vitamin D deficiency, AAA, s/p CABG X 4  Short of breath on occasion   Clinical Social Work Clinical Goal(s):   LCSW to call client in next 30 days to tidsucs client completion of ADLs and daily activities  Interventions:  Talked with client about CCM program support  Talked with client about her mobility challenges  Talked with client about pain issues (pain in her feet)  Taked with clinet about her breathing challenges (occasional short of breath)  Talked with client about her heart surgery (heart surgery in February of 2021)  Talked with client aobut her upcoming client appointments  Talked with Aden about relaxation techniques of choice Gladd likes to [employer] , she likes to listen to music, and likes to read)  Provided counseling support for client   Talked with client about transport needs of client  Talked with client about health needs of her spouse  Talked with client about meal provision of client  Talked with client about family support  Talked with client about vision of client  Talked with client about upcoming cardiology appointment in February of 2022.  Talked with client about mood of client  Patient Self Care Activities:  Takes medications as prescribed Attends scheduled medical appointments   Patient Self Care Deficits:   Mobility challenges  Shortness of breath occasionally   Initial goal documentation     Follow Up Plan: LCSW to call client in next 4 weeks to talk with her about her completion of daily ADLs and daily activities  Materials  Provided: No  The patient verbalized understanding of instructions provided today and declined a print copy of patient instruction materials.   [name].Caron Ode MSW, LCSW Licensed Clinical Social Worker [facility] Care Management [phone]

## 2020-04-24 ENCOUNTER — Other Ambulatory Visit: Payer: Self-pay | Admitting: Family Medicine

## 2020-04-24 DIAGNOSIS — G6289 Other specified polyneuropathies: Secondary | ICD-10-CM

## 2020-04-25 ENCOUNTER — Other Ambulatory Visit: Payer: Self-pay | Admitting: Cardiology

## 2020-04-30 ENCOUNTER — Telehealth: Payer: Self-pay | Admitting: Cardiology

## 2020-04-30 DIAGNOSIS — I480 Paroxysmal atrial fibrillation: Secondary | ICD-10-CM

## 2020-04-30 MED ORDER — APIXABAN 5 MG PO TABS: 5.0000 mg | ORAL_TABLET | Freq: Two times a day (BID) | ORAL | 1 refills | Status: DC

## 2020-04-30 NOTE — Telephone Encounter (Signed)
*  STAT* If patient is at the pharmacy, call can be transferred to refill team.   1. Which medications need to be refilled? (please list name of each medication and dose if known) Eliquis  2. Which pharmacy/location (including street and city if local pharmacy) is medication to be sent to? [address], [place]   3. Do they need a 30 day or 90 day supply? 60 and refills

## 2020-05-02 ENCOUNTER — Encounter: Payer: Self-pay | Admitting: Cardiology

## 2020-05-02 NOTE — Telephone Encounter (Signed)
**Note De-Identified  Obfuscation** I s/w Shata @ [employer] who advised me that they do have the pts application but that it has not gone through the processing Dept yet as they are behind.  I asked that they make this urgent as we faxed them the pt application on 39/0/30.  She states that we should receive a fax and the pt a letter of their determination within a week. She also could not verify that the pts application is complete or not but that if it is not they will contact the pt and [place] if anything is missing.  I called the pts husband Laverna Peace and updated him. He states that he called [employer] before calling us and was advised that they did not receive hthe pts application.  He also states that he purchased a 30 day supply of the pts Eliquis so this needs to be handled within that time frame.  He is aware to call them daily until we get a determination and that they will let him know each time he calls where they are in the processing of the pts application and if anything is needed.

## 2020-05-02 NOTE — Telephone Encounter (Signed)
Patient's husband calling to follow up on pt assistance for eliquis.

## 2020-05-02 NOTE — Telephone Encounter (Addendum)
**Note De-Identified  Obfuscation** I was not involved with the pts application. I will call [employer] to check the progress of the pts application.

## 2020-05-02 NOTE — Telephone Encounter (Signed)
error 

## 2020-05-04 ENCOUNTER — Ambulatory Visit (INDEPENDENT_AMBULATORY_CARE_PROVIDER_SITE_OTHER): Payer: Medicare Other | Admitting: Licensed Clinical Social Worker

## 2020-05-04 DIAGNOSIS — I5031 Acute diastolic (congestive) heart failure: Secondary | ICD-10-CM

## 2020-05-04 DIAGNOSIS — Z951 Presence of aortocoronary bypass graft: Secondary | ICD-10-CM

## 2020-05-04 DIAGNOSIS — I714 Abdominal aortic aneurysm, without rupture, unspecified: Secondary | ICD-10-CM

## 2020-05-04 DIAGNOSIS — E559 Vitamin D deficiency, unspecified: Secondary | ICD-10-CM

## 2020-05-04 DIAGNOSIS — M5136 Other intervertebral disc degeneration, lumbar region: Secondary | ICD-10-CM

## 2020-05-04 DIAGNOSIS — E785 Hyperlipidemia, unspecified: Secondary | ICD-10-CM

## 2020-05-04 DIAGNOSIS — I10 Essential (primary) hypertension: Secondary | ICD-10-CM

## 2020-05-04 NOTE — Patient Instructions (Addendum)
Licensed Clinical Social Worker Visit Information  Goals we discussed today:  .  Client will talk with LCSW in next 30 days to discuss client completion of ADLs and dialy tasks (pt-stated)        CARE PLAN ENTRY (see longitudinal plan of care for additional care plan information)  Current Barriers:   Mobility challenges in client with Chronic Diagnoses of CHF, HLD, HTN, OA, DDD, Vitamin D deficiency, AAA, s/p CABG X 4  Short of breath on occasion   Clinical Social Work Clinical Goal(s):   LCSW to call client in next 30 days to tidsucs client completion of ADLs and daily activities  Interventions:  Talked with Darliss Ridgel, spouse of client, about client needs Talked with Laverna Peace, spouse of client, about client medication procurement Talked with Laverna Peace about sleeping issues of client Talked with Laverna Peace about appetite of client Talked with Laverna Peace about ambulation of client  Talked with Laverna Peace about pain of client Talked with Laverna Peace about vision of client Talked with Laverna Peace about transport needs of client Talked with Laverna Peace about client's upcoming medical appointments Talked with Laverna Peace about client appointment next week with cardiologist, Dr. Percival Spanish Talked with Laverna Peace about relaxation techniques of client Peri likes to cook , she likes to listen to music, and likes to read) Talked with Laverna Peace about family support for client Encouraged Laverna Peace or client to call RNCM as needed for nursing support   Patient Self Care Activities:  Takes medications as prescribed Attends scheduled medical appointments   Patient Self Care Deficits:   Mobility challenges  Shortness of breath occasionally   Initial goal documentation     Follow Up Plan: LCSW to call client/spouse of client,  in next 4 weeks to talk with  client/spouse about client completion of daily ADLs and daily activities  Materials Provided: No  The patient Gabriela Smith, spouse of client, verbalized  understanding of instructions provided today and declined a print copy of patient instruction materials.   [name].Stavroula Rohde MSW, LCSW Licensed Clinical Social Worker [facility] Care Management [phone]

## 2020-05-04 NOTE — Chronic Care Management (AMB) (Signed)
Chronic Care Management    Clinical Social Work Follow Up Note  05/04/2020 Name: Gabriela Smith MRN: [id number] DOB: 1940/03/20  Gabriela Smith is a 80 y.o. year old female who is a primary care patient of [name], DO. The CCM team was consulted for assistance with [employer] .   Review of patient status, including review of consultants reports, other relevant assessments, and collaboration with appropriate care team members and the patient's provider was performed as part of comprehensive patient evaluation and provision of chronic care management services.    SDOH (Social Determinants of Health) assessments performed: No; risk for depression; risk for tobacco use; risk for physical inactivity; risk for stress  Flowsheet Row CARDIAC REHAB PHASE II EXERCISE from 10/21/2019 in [facility]  PHQ-9 Total Score 0     GAD 7 : Generalized Anxiety Score 07/06/2019  Nervous, Anxious, on Edge 0  Control/stop worrying 0  Worry too much - different things 0  Trouble relaxing 0  Restless 1  Easily annoyed or irritable 0  Afraid - awful might happen 0  Total GAD 7 Score 1  Anxiety Difficulty Not difficult at all    Outpatient Encounter Medications as of 05/04/2020  Medication Sig  . acetaminophen (TYLENOL) 500 MG tablet Take 2 tablets (1,000 mg total) by mouth every 8 (eight) hours.  [name] amLODipine (NORVASC) 5 MG tablet Take 1 tablet (5 mg total) by mouth daily.  [name] apixaban (ELIQUIS) 5 MG TABS tablet Take 1 tablet (5 mg total) by mouth 2 (two) times daily.  [name] aspirin EC 81 MG EC tablet Take 1 tablet (81 mg total) by mouth daily.  . cholecalciferol (VITAMIN D) 1000 UNITS tablet Take 1,000 Units by mouth daily after breakfast.   . Co-Enzyme Q10 200 MG CAPS Take 200 mg by mouth daily after breakfast.   . digoxin (LANOXIN) 0.125 MG tablet TAKE 1 TABLET DAILY  . digoxin (LANOXIN) 0.125 MG tablet Take 1 tablet (125 mcg total) by mouth daily.  . Evolocumab  (REPATHA SURECLICK) 774 MG/ML SOAJ Inject 1 Dose into the skin every 14 (fourteen) days.  . Flaxseed, Linseed, (FLAX SEED OIL) 1000 MG CAPS Take 1,000 mg by mouth daily after breakfast.   . gabapentin (NEURONTIN) 100 MG capsule Take 2 capsules (200 mg total) by mouth at bedtime.  [name] GNP VITAMIN B-12 500 MCG tablet TAKE 1 TABLET DAILY  . levothyroxine (SYNTHROID) 50 MCG tablet Take 1 tablet (50 mcg total) by mouth daily.  . Magnesium 300 MG CAPS Take 300 mg by mouth at bedtime.   . metoprolol tartrate (LOPRESSOR) 50 MG tablet TAKE (1) TABLET TWICE A DAY.  . nitroGLYCERIN (NITROSTAT) 0.4 MG SL tablet PLACE 1 TABLET UNDER THE TONGUE AT ONSET OF CHEST PAIN EVERY 5 MINTUES UP TO 3 TIMES AS NEEDED  . traMADol (ULTRAM) 50 MG tablet Take 50 mg by mouth every 4 (four) hours as needed for moderate pain.   . TURMERIC PO Take 1 capsule by mouth daily after breakfast.   Facility-Administered Encounter Medications as of 05/04/2020  Medication  . DOBUTamine (DOBUTREX) 1,000 mcg/mL in dextrose 5% 250 mL infusion   Goals    .  Client will talk with LCSW in next 30 days to discuss client completion of ADLs and dialy tasks (pt-stated)      CARE PLAN ENTRY (see longitudinal plan of care for additional care plan information)  Current Barriers:  [name] Mobility challenges in client with Chronic Diagnoses of CHF,  HLD, HTN, OA, DDD, Vitamin D deficiency, AAA, s/p CABG X 4 . Short of breath on occasion   Clinical Social Work Clinical Goal(s):  [name] LCSW to call client in next 30 days to tidsucs client completion of ADLs and daily activities  Interventions:  Talked with Darliss Ridgel, spouse of client, about client needs Talked with Laverna Peace, spouse of client, about client medication procurement Talked with Laverna Peace about sleeping issues of client Talked with Laverna Peace about appetite of client Talked with Laverna Peace about ambulation of client  Talked with Laverna Peace about pain of client Talked with Laverna Peace about vision of client Talked  with Laverna Peace about transport needs of client Talked with Laverna Peace about client's upcoming medical appointments Talked with Laverna Peace about client appointment next week with cardiologist, Dr. Percival Spanish Talked with Laverna Peace about relaxation techniques of client [place] likes to cook , she likes to listen to music, and likes to read) Talked with Laverna Peace about family support for client Encouraged Laverna Peace or client to call [facility] as needed for nursing support   Patient Self Care Activities:  Takes medications as prescribed Attends scheduled medical appointments  . Patient Self Care Deficits:  [name] Mobility challenges . Shortness of breath occasionally   Initial goal documentation     Follow Up Plan: LCSW to call client in next 4 weeks to talk with her about her completion of daily ADLs and daily activities  [name].Faven Watterson MSW, LCSW Licensed Clinical Social Worker [facility] Care Management [phone]

## 2020-05-07 NOTE — Progress Notes (Signed)
Cardiology Office Note   Date:  05/09/2020   ID:  Gabriela Smith, Gabriela Smith 15-Jun-1940, MRN [id number]  PCP:  [name], DO  Cardiologist:   Minus Breeding, MD   Chief Complaint  Patient presents with  . Atrial Fibrillation      History of Present Illness: Gabriela Smith is a 80 y.o. female who presents for follow up after CABG.    She had a cardiac cath on 04/19/2019 noting severe multivessel disease, with mild osteal and severe distal left main disease involving the LCx with severe proximal and mid LAD diease, as well as ostial and mid RCA diffuse disease. As a result, she underwent CABG X 4 with LIMA to LAD, SVG to Mid LAD, SVG to OM, and SVG to PDA.   Since I last saw her she did well up until this morning and she noticed that her heart was racing and she was a little weak.  She feels like her heart is out of rhythm so she is not surprised that she is in fibrillation.  She has not really noticed this otherwise recently.  She otherwise was feeling well and had good energy.  She said her husband had a hard night with his Parkinson's and so she did not sleep at all last night.  She is not having any presyncope or syncope.  She denied having any chest pressure, neck or arm discomfort.  She has had no weight gain.  She does have some mild lower extremity swelling left greater than right.  Past Medical History:  Diagnosis Date  . AAA (abdominal aortic aneurysm) [facility])    cardiology aware   . Arthritis    OA lt knee- cortizone inj. q 4 months AND OA ALSO IN BACK. Had knee replaced-issue resolved  . CAD (coronary artery disease) CARDIOLOGIST- DR [facility]--  VISIT 01-02-10 IN [facility]IC   1993-- PTCA. Pt describes a near total blockage of apparently the LAD and a 65% stenosis elsewhere.  . Cancer ([place])   . History of bladder cancer followed by dr Karsten Ro   hx  TCC of bladder ,  Ta G3-  first occurence 02-20-2012--  s/p BCG tx's  and  mitomycin C  . Hyperlipidemia 1993  . Hypertension 1993   . Nocturia   . Osteopenia   . Sigmoid diverticulosis     Past Surgical History:  Procedure Laterality Date  . CHOLECYSTECTOMY  2003 (approx)  . CLIPPING OF ATRIAL APPENDAGE N/A 04/25/2019   Procedure: Clipping Of Atrial Appendage;  Surgeon: Lajuana Matte, MD;  Location: [place];  Service: Open Heart Surgery;  Laterality: N/A;  . COLONOSCOPY  05-31-2003  . CORONARY ANGIOPLASTY  1993   to LAD  . CORONARY ARTERY BYPASS GRAFT N/A 04/25/2019   Procedure: CORONARY ARTERY BYPASS GRAFTING (CABG) times four using left internal mammary artery and left greater saphenous vein harvested endoscipically.;  Surgeon: Lajuana Matte, MD;  Location: [place];  Service: Open Heart Surgery;  Laterality: N/A;  . CYSTOSCOPY WITH BIOPSY  05/09/2011   Procedure: CYSTOSCOPY WITH BIOPSY;  Surgeon: Claybon Jabs, MD;  Location: [facility];  Service: Urology;  Laterality: N/A;  WITH BLADDER BIOPSY GYRUS  . LEFT HEART CATH AND CORONARY ANGIOGRAPHY N/A 04/19/2019   Procedure: LEFT HEART CATH AND CORONARY ANGIOGRAPHY;  Surgeon: Leonie Man, MD;  Location: [place] [facility];  Service: Cardiovascular;  Laterality: N/A;  . TOTAL HIP ARTHROPLASTY Right 12/25/2015   Procedure: RIGHT TOTAL HIP  ARTHROPLASTY ANTERIOR APPROACH;  Surgeon: Paralee Cancel, MD;  Location: [facility];  Service: Orthopedics;  Laterality: Right;  . TOTAL KNEE ARTHROPLASTY Left 11/22/2012   Procedure: LEFT TOTAL KNEE ARTHROPLASTY;  Surgeon: Mauri Pole, MD;  Location: [facility];  Service: Orthopedics;  Laterality: Left;  . TRANSURETHRAL RESECTION OF BLADDER TUMOR  03/14/2011   Procedure: TRANSURETHRAL RESECTION OF BLADDER TUMOR (TURBT);  Surgeon: Claybon Jabs, MD;  Location: [facility];  Service: Urology;  Laterality: N/A;  . TRANSURETHRAL RESECTION OF BLADDER TUMOR  05/09/2011   Procedure: TRANSURETHRAL RESECTION OF BLADDER TUMOR (TURBT);  Surgeon: Claybon Jabs, MD;  Location: [facility];  Service:  Urology;  Laterality: N/A;  . TRANSURETHRAL RESECTION OF BLADDER TUMOR  03/08/2012   Procedure: TRANSURETHRAL RESECTION OF BLADDER TUMOR (TURBT);  Surgeon: Claybon Jabs, MD;  Location[facility];  Service: Urology;  Laterality: N/A;  . TRANSURETHRAL RESECTION OF BLADDER TUMOR WITH GYRUS (TURBT-GYRUS) N/A 03/24/2014   Procedure: TRANSURETHRAL RESECTION OF BLADDER TUMOR WITH GYRUS (TURBT-GYRUS);  Surgeon: Claybon Jabs, MD;  Location: [facility];  Service: Urology;  Laterality: N/A;  . UMBILICAL HERNIA REPAIR  2001  (approx)     Current Outpatient Medications  Medication Sig Dispense Refill  . acetaminophen (TYLENOL) 500 MG tablet Take 2 tablets (1,000 mg total) by mouth every 8 (eight) hours. 30 tablet 0  . amLODipine (NORVASC) 5 MG tablet Take 1 tablet (5 mg total) by mouth daily. 90 tablet 3  . apixaban (ELIQUIS) 5 MG TABS tablet Take 1 tablet (5 mg total) by mouth 2 (two) times daily. 180 tablet 1  . aspirin EC 81 MG EC tablet Take 1 tablet (81 mg total) by mouth daily.    . cholecalciferol (VITAMIN D) 1000 UNITS tablet Take 1,000 Units by mouth daily after breakfast.     . Co-Enzyme Q10 200 MG CAPS Take 200 mg by mouth daily after breakfast.     . digoxin (LANOXIN) 0.125 MG tablet Take 1 tablet (125 mcg total) by mouth daily. 90 tablet 1  . Evolocumab (REPATHA SURECLICK) 734 MG/ML SOAJ Inject 1 Dose into the skin every 14 (fourteen) days. 2 pen 11  . Flaxseed, Linseed, (FLAX SEED OIL) 1000 MG CAPS Take 1,000 mg by mouth daily after breakfast.     . gabapentin (NEURONTIN) 100 MG capsule Take 2 capsules (200 mg total) by mouth at bedtime. 180 capsule 0  . GNP VITAMIN B-12 500 MCG tablet TAKE 1 TABLET DAILY 90 tablet 0  . Magnesium 300 MG CAPS Take 300 mg by mouth at bedtime.     . metoprolol tartrate (LOPRESSOR) 50 MG tablet Take 1.5 tablets (75 mg total) by mouth 2 (two) times daily. 270 tablet 3  . nitroGLYCERIN (NITROSTAT) 0.4 MG SL tablet PLACE 1 TABLET  UNDER THE TONGUE AT ONSET OF CHEST PAIN EVERY 5 MINTUES UP TO 3 TIMES AS NEEDED 25 tablet 0  . levothyroxine (SYNTHROID) 50 MCG tablet Take 1 tablet (50 mcg total) by mouth daily. 90 tablet 3  . TURMERIC PO Take 1 capsule by mouth daily after breakfast.     No current facility-administered medications for this visit.   Facility-Administered Medications Ordered in Other Visits  Medication Dose Route Frequency Provider Last Rate Last Admin  . DOBUTamine (DOBUTREX) 1,000 mcg/mL in dextrose 5% 250 mL infusion  20 mcg/kg/min Intravenous Continuous Dorothy Spark, MD 96.4 mL/hr at 12/24/15 0840 20 mcg/kg/min at 12/24/15 0840  Allergies:   Fish allergy, Fish-derived products, Shellfish-derived products, Lasix [furosemide], Macrodantin [nitrofurantoin macrocrystal], Vicodin [hydrocodone-acetaminophen], Zetia [ezetimibe], Codeine, Latex, Niacin, Niaspan [niacin er], Statins, and Sulfa antibiotics    ROS:  Please see the history of present illness.   Otherwise, review of systems are positive for none.   All other systems are reviewed and negative.    PHYSICAL EXAM: VS:  BP 138/82   Pulse (!) 113   Ht 5\' 3"  (1.6 m)   Wt 184 lb (83.5 kg)   BMI 32.59 kg/m  , BMI Body mass index is 32.59 kg/m. GENERAL:  Well appearing NECK:  No jugular venous distention, waveform within normal limits, carotid upstroke brisk and symmetric, no bruits, no thyromegaly LUNGS:  Clear to auscultation bilaterally CHEST:  Unremarkable HEART:  PMI not displaced or sustained,S1 and S2 within normal limits, no S3, no clicks, no rubs, no murmurs, irregular  ABD:  Flat, positive bowel sounds normal in frequency in pitch, no bruits, no rebound, no guarding, no midline pulsatile mass, no hepatomegaly, no splenomegaly EXT:  2 plus pulses throughout, left leg edema, no cyanosis no clubbing    EKG:  EKG is ordered today. The ekg ordered today demonstrates lesion, rate 113, axis within normal limits, intervals within normal  limits, nonspecific inferolateral T wave changes   Recent Labs: 08/12/2019: ALT 25; BUN 13; Creatinine, Ser 0.81; Hemoglobin 11.8; Platelets 248; Potassium 4.7; Sodium 139 12/12/2019: TSH 4.330    Lipid Panel    Component Value Date/Time   CHOL 144 08/12/2019 1043   TRIG 224 (H) 08/12/2019 1043   TRIG 414 (H) 07/31/2016 0942   HDL 46 08/12/2019 1043   HDL 49 07/31/2016 0942   CHOLHDL 3.1 08/12/2019 1043   CHOLHDL 3.8 04/16/2019 0508   VLDL 26 04/16/2019 0508   LDLCALC 62 08/12/2019 1043   LDLCALC 205 (H) 11/28/2013 1215   LDLDIRECT 193 (H) 10/21/2016 0902      Wt Readings from Last 3 Encounters:  05/09/20 184 lb (83.5 kg)  01/27/20 181 lb (82.1 kg)  01/25/20 170 lb (77.1 kg)      Other studies Reviewed: Additional studies/ records that were reviewed today include: Labs. Review of the above records demonstrates:  Please see elsewhere in the note.     ASSESSMENT AND PLAN:  Lower extremity edema:This seems to be chronic and unchanged.  No change in therapy.  Coronary artery disease:She is not having angina.  No change in therapy.  Atrial fibrillation:  She had this postop.  I did take her off of amiodarone sometime ago.  She is probably had some short paroxysms she thinks.  She says that most of the time she knows she is in sinus rhythm.  She is on anticoagulation.  I am going to increase her metoprolol to 75 mg twice a day.  I am going to send a 3-day monitor.  Further management will be based on these results.   Hyperlipidemia:  LDL was 62 with an HDL of 46.  No change in therapy.   Essential hypertension:Blood pressure is at target.  No change in therapy.     Current medicines are reviewed at length with the patient today.  The patient does not have concerns regarding medicines.  The following changes have been made: None  Labs/ tests ordered today include:   Orders Placed This Encounter  Procedures  . LONG TERM MONITOR (3-14 DAYS)  . EKG  12-Lead     Disposition:   FU with me in  one month.     Signed, Minus Breeding, MD  05/09/2020 11:19 AM    [place]

## 2020-05-09 ENCOUNTER — Telehealth: Payer: Self-pay | Admitting: *Deleted

## 2020-05-09 ENCOUNTER — Ambulatory Visit (INDEPENDENT_AMBULATORY_CARE_PROVIDER_SITE_OTHER): Payer: Medicare Other

## 2020-05-09 ENCOUNTER — Other Ambulatory Visit: Payer: Self-pay

## 2020-05-09 ENCOUNTER — Encounter: Payer: Self-pay | Admitting: Radiology

## 2020-05-09 ENCOUNTER — Encounter: Payer: Self-pay | Admitting: Cardiology

## 2020-05-09 ENCOUNTER — Ambulatory Visit: Payer: Medicare Other | Admitting: Cardiology

## 2020-05-09 VITALS — BP 138/82 | HR 113 | Ht 63.0 in | Wt 184.0 lb

## 2020-05-09 DIAGNOSIS — I2510 Atherosclerotic heart disease of native coronary artery without angina pectoris: Secondary | ICD-10-CM

## 2020-05-09 DIAGNOSIS — E785 Hyperlipidemia, unspecified: Secondary | ICD-10-CM | POA: Diagnosis not present

## 2020-05-09 DIAGNOSIS — R600 Localized edema: Secondary | ICD-10-CM

## 2020-05-09 DIAGNOSIS — I480 Paroxysmal atrial fibrillation: Secondary | ICD-10-CM

## 2020-05-09 DIAGNOSIS — I10 Essential (primary) hypertension: Secondary | ICD-10-CM

## 2020-05-09 MED ORDER — METOPROLOL TARTRATE 50 MG PO TABS: 75.0000 mg | ORAL_TABLET | Freq: Two times a day (BID) | ORAL | 3 refills | Status: DC

## 2020-05-09 NOTE — Patient Instructions (Signed)
Medication Instructions:  Please increase your Metoprolol to 75 mg twice a day. (1 and 1/2 tablets twice a day) Continue all other medications as listed.  *If you need a refill on your cardiac medications before your next appointment, please call your pharmacy*  Testing/Procedures: [place] Monitor Instructions   Your physician has requested you wear your ZIO patch monitor 3 days.   This is a single patch monitor.  [employer] supplies one patch monitor per enrollment.  Additional stickers are not available.   Please do not apply patch if you will be having a Nuclear Stress Test, Echocardiogram, Cardiac CT, MRI, or Chest Xray during the time frame you would be wearing the monitor. The patch cannot be worn during these tests.  You cannot remove and re-apply the ZIO XT patch monitor.   Your ZIO patch monitor will be sent USPS Priority mail from [facility] directly to your home address. The monitor may also be mailed to a PO BOX if home delivery is not available.   It may take 3-5 days to receive your monitor after you have been enrolled.   Once you have received you monitor, please review enclosed instructions.  Your monitor has already been registered assigning a specific monitor serial # to you.   Applying the monitor   Shave hair from upper left chest.   Hold abrader disc by orange tab.  Rub abrader in 40 strokes over left upper chest as indicated in your monitor instructions.   Clean area with 4 enclosed alcohol pads .  Use all pads to assure are is cleaned thoroughly.  Let dry.   Apply patch as indicated in monitor instructions.  Patch will be place under collarbone on left side of chest with arrow pointing upward.   Rub patch adhesive wings for 2 minutes.Remove white label marked "1".  Remove white label marked "2".  Rub patch adhesive wings for 2 additional minutes.   While looking in a mirror, press and release button in center of patch.  A small green light will  flash 3-4 times .  This will be your only indicator the monitor has been turned on.     Do not shower for the first 24 hours.  You may shower after the first 24 hours.   Press button if you feel a symptom. You will hear a small click.  Record Date, Time and Symptom in the Patient Log Book.   When you are ready to remove patch, follow instructions on last 2 pages of Patient Log Book.  Stick patch monitor onto last page of Patient Log Book.   Place Patient Log Book in [place] box.  Use locking tab on box and tape box closed securely.  The [employer] and [employer] has [employer] on it.  Please place in mailbox as soon as possible.  Your physician should have your test results approximately 7 days after the monitor has been mailed back to [facility].   Call [place] at [phone] if you have questions regarding your ZIO XT patch monitor.  Call them immediately if you see an orange light blinking on your monitor.   If your monitor falls off in less than 4 days contact our Monitor department at [phone].  If your monitor becomes loose or falls off after 4 days call [employer] at [phone] for suggestions on securing your monitor.   Follow-Up: At [facility], you and your health needs are our priority.  As part of our  continuing mission to provide you with exceptional heart care, we have created designated Provider Care Teams.  These Care Teams include your primary Cardiologist (physician) and Advanced Practice Providers (APPs -  Physician Assistants and Nurse Practitioners) who all work together to provide you with the care you need, when you need it.  We recommend signing up for the patient portal called "MyChart".  Sign up information is provided on this After Visit Summary.  MyChart is used to connect with patients for Virtual Visits (Telemedicine).  Patients are able to view lab/test results, encounter notes, upcoming appointments, etc.  Non-urgent messages can be  sent to your provider as well.   To learn more about what you can do with MyChart, go to [email or web address].    Your next appointment:   4 month(s)  The format for your next appointment:   In Person  Provider:   Minus Breeding, MD  Thank you for choosing [facility]!!

## 2020-05-09 NOTE — Telephone Encounter (Signed)
Pt here in office today to see Dr Percival Spanish.  She has sent in application for assistance for Eliquis.  She requested I call to determine what the hold up is since it has been a while since it was sent in.  Called and spoke with Lewis.  Elmyra Ricks informed me they need the patient to complete the pt information, insurance info, household size & income information.  She faxed the form to me which was given to the pt.  Pt completed the form and brought it back to the office.  PW was faxed back to  [phone] today with confirmation at 1 pm.

## 2020-05-09 NOTE — Progress Notes (Signed)
Enrolled patient for a 3 day Zio XT Monitor to be mailed to patients home.  °

## 2020-05-12 DIAGNOSIS — I480 Paroxysmal atrial fibrillation: Secondary | ICD-10-CM | POA: Diagnosis not present

## 2020-05-14 NOTE — Telephone Encounter (Signed)
Pt husband called in to fu on the status of the the patient assistance for Eliquis    Best number  [phone]

## 2020-05-14 NOTE — Telephone Encounter (Signed)
Returned the call to the patient. She and her husband have been made aware that the assistance was faxed.

## 2020-05-16 ENCOUNTER — Telehealth: Payer: Self-pay | Admitting: Internal Medicine

## 2020-05-16 NOTE — Telephone Encounter (Signed)
Will route to Pharmacy to assist with [place] assistance.

## 2020-05-16 NOTE — Telephone Encounter (Signed)
Spoke with patient/spouse about apply for grant from [employer]. Patient has a grant already, expiring in May 2022. Explained would send website for re-enrollment application to MyChart portal.

## 2020-05-16 NOTE — Telephone Encounter (Signed)
New Message:     Pt's husband called and said that the [place] is too expensive and she need help with it please.

## 2020-05-16 NOTE — Telephone Encounter (Signed)
Will route to [employer] as this is a hilty lipid clinic pt

## 2020-05-18 DIAGNOSIS — I480 Paroxysmal atrial fibrillation: Secondary | ICD-10-CM | POA: Diagnosis not present

## 2020-06-05 ENCOUNTER — Telehealth: Payer: Medicare Other

## 2020-06-05 ENCOUNTER — Telehealth: Payer: Self-pay | Admitting: Family Medicine

## 2020-06-05 DIAGNOSIS — G6289 Other specified polyneuropathies: Secondary | ICD-10-CM

## 2020-06-05 MED ORDER — GABAPENTIN 100 MG PO CAPS: 200.0000 mg | ORAL_CAPSULE | Freq: Every day | ORAL | 0 refills | Status: DC

## 2020-06-05 NOTE — Telephone Encounter (Signed)
  Prescription Request  06/05/2020  What is the name of the medication or equipment? Gabapentin  Have you contacted your pharmacy to request a refill? (if applicable) yes  Which pharmacy would you like this sent to? [facility]   Patient notified that their request is being sent to the clinical staff for review and that they should receive a response within 2 business days.

## 2020-06-05 NOTE — Telephone Encounter (Signed)
Pt aware refill to pharmacy

## 2020-06-06 ENCOUNTER — Other Ambulatory Visit: Payer: Self-pay | Admitting: Internal Medicine

## 2020-06-06 NOTE — Telephone Encounter (Signed)
Rx(s) sent to pharmacy electronically.  

## 2020-06-27 ENCOUNTER — Telehealth: Payer: Medicare Other

## 2020-07-03 DIAGNOSIS — R609 Edema, unspecified: Secondary | ICD-10-CM

## 2020-07-03 DIAGNOSIS — I4819 Other persistent atrial fibrillation: Secondary | ICD-10-CM | POA: Insufficient documentation

## 2020-07-03 HISTORY — DX: Edema, unspecified: R60.9

## 2020-07-03 NOTE — Progress Notes (Signed)
Cardiology Office Note   Date:  07/04/2020   ID:  Gabriela Smith, Gabriela Smith Oct 16, 1940, MRN [id number]  PCP:  [name], DO  Cardiologist:   Minus Breeding, MD   Chief Complaint  Patient presents with  . Atrial Fibrillation      History of Present Illness: Gabriela Smith is a 80 y.o. female who presents for follow up after CABG.    She had a cardiac cath on 04/19/2019 noting severe multivessel disease, with mild osteal and severe distal left main disease involving the LCx with severe proximal and mid LAD diease, as well as ostial and mid RCA diffuse disease. As a result, she underwent CABG X 4 with LIMA to LAD, SVG to Mid LAD, SVG to OM, and SVG to PDA.   At the last visit she was having palpitations and a monitor demonstrated persistent atrial fib with good rate control.  I had increased her beta blocker prior to a 3 day monitor that she wore.  She does not feel her fibrillation.  She stays very active.  She does a lot of household chores and does some walking. The patient denies any new symptoms such as chest discomfort, neck or arm discomfort. There has been no new shortness of breath, PND or orthopnea. There have been no reported palpitations, presyncope or syncope.   Past Medical History:  Diagnosis Date  . AAA (abdominal aortic aneurysm) [facility])    cardiology aware   . Arthritis    OA lt knee- cortizone inj. q 4 months AND OA ALSO IN BACK. Had knee replaced-issue resolved  . CAD (coronary artery disease) CARDIOLOGIST- DR [facility]--  VISIT 01-02-10 IN EPIC   1993-- PTCA. Pt describes a near total blockage of apparently the LAD and a 65% stenosis elsewhere.  . Cancer ([place])   . History of bladder cancer followed by dr Karsten Ro   hx  TCC of bladder ,  Ta G3-  first occurence 02-20-2012--  s/p BCG tx's  and  mitomycin C  . Hyperlipidemia 1993  . Hypertension 1993  . Nocturia   . Osteopenia   . Sigmoid diverticulosis     Past Surgical History:  Procedure Laterality Date   . CHOLECYSTECTOMY  2003 (approx)  . CLIPPING OF ATRIAL APPENDAGE N/A 04/25/2019   Procedure: Clipping Of Atrial Appendage;  Surgeon: Lajuana Matte, MD;  Location: [place];  Service: Open Heart Surgery;  Laterality: N/A;  . COLONOSCOPY  05-31-2003  . CORONARY ANGIOPLASTY  1993   to LAD  . CORONARY ARTERY BYPASS GRAFT N/A 04/25/2019   Procedure: CORONARY ARTERY BYPASS GRAFTING (CABG) times four using left internal mammary artery and left greater saphenous vein harvested endoscipically.;  Surgeon: Lajuana Matte, MD;  Location: [place];  Service: Open Heart Surgery;  Laterality: N/A;  . CYSTOSCOPY WITH BIOPSY  05/09/2011   Procedure: CYSTOSCOPY WITH BIOPSY;  Surgeon: Claybon Jabs, MD;  Location: [facility];  Service: Urology;  Laterality: N/A;  WITH BLADDER BIOPSY GYRUS  . LEFT HEART CATH AND CORONARY ANGIOGRAPHY N/A 04/19/2019   Procedure: LEFT HEART CATH AND CORONARY ANGIOGRAPHY;  Surgeon: Leonie Man, MD;  Location: [place] [facility];  Service: Cardiovascular;  Laterality: N/A;  . TOTAL HIP ARTHROPLASTY Right 12/25/2015   Procedure: RIGHT TOTAL HIP ARTHROPLASTY ANTERIOR APPROACH;  Surgeon: Paralee Cancel, MD;  Location: [facility];  Service: Orthopedics;  Laterality: Right;  . TOTAL KNEE ARTHROPLASTY Left 11/22/2012   Procedure: LEFT TOTAL KNEE ARTHROPLASTY;  Surgeon: Mauri Pole, MD;  Location: [facility];  Service: Orthopedics;  Laterality: Left;  . TRANSURETHRAL RESECTION OF BLADDER TUMOR  03/14/2011   Procedure: TRANSURETHRAL RESECTION OF BLADDER TUMOR (TURBT);  Surgeon: Claybon Jabs, MD;  Location[facility];  Service: Urology;  Laterality: N/A;  . TRANSURETHRAL RESECTION OF BLADDER TUMOR  05/09/2011   Procedure: TRANSURETHRAL RESECTION OF BLADDER TUMOR (TURBT);  Surgeon: Claybon Jabs, MD;  Location: [facility];  Service: Urology;  Laterality: N/A;  . TRANSURETHRAL RESECTION OF BLADDER TUMOR  03/08/2012   Procedure: TRANSURETHRAL  RESECTION OF BLADDER TUMOR (TURBT);  Surgeon: Claybon Jabs, MD;  Location: [facility];  Service: Urology;  Laterality: N/A;  . TRANSURETHRAL RESECTION OF BLADDER TUMOR WITH GYRUS (TURBT-GYRUS) N/A 03/24/2014   Procedure: TRANSURETHRAL RESECTION OF BLADDER TUMOR WITH GYRUS (TURBT-GYRUS);  Surgeon: Claybon Jabs, MD;  Location: [facility];  Service: Urology;  Laterality: N/A;  . UMBILICAL HERNIA REPAIR  2001  (approx)     Current Outpatient Medications  Medication Sig Dispense Refill  . acetaminophen (TYLENOL) 500 MG tablet Take 2 tablets (1,000 mg total) by mouth every 8 (eight) hours. 30 tablet 0  . amLODipine (NORVASC) 5 MG tablet Take 1 tablet (5 mg total) by mouth daily. 90 tablet 3  . apixaban (ELIQUIS) 5 MG TABS tablet Take 1 tablet (5 mg total) by mouth 2 (two) times daily. 180 tablet 1  . aspirin EC 81 MG EC tablet Take 1 tablet (81 mg total) by mouth daily.    . cholecalciferol (VITAMIN D) 1000 UNITS tablet Take 1,000 Units by mouth daily after breakfast.     . Co-Enzyme Q10 200 MG CAPS Take 200 mg by mouth daily after breakfast.     . digoxin (LANOXIN) 0.125 MG tablet Take 1 tablet (125 mcg total) by mouth daily. 90 tablet 1  . Evolocumab (REPATHA SURECLICK) 892 MG/ML SOAJ Inject 1 Dose into the skin every 14 (fourteen) days. Fasting lab work and appointment with Dr. Debara Pickett needed for refills. Thanks 2 mL 1  . Flaxseed, Linseed, (FLAX SEED OIL) 1000 MG CAPS Take 1,000 mg by mouth daily after breakfast.     . gabapentin (NEURONTIN) 100 MG capsule Take 2 capsules (200 mg total) by mouth at bedtime. 180 capsule 0  . GNP VITAMIN B-12 500 MCG tablet TAKE 1 TABLET DAILY 90 tablet 0  . levothyroxine (SYNTHROID) 50 MCG tablet Take 1 tablet (50 mcg total) by mouth daily. 90 tablet 3  . Magnesium 300 MG CAPS Take 300 mg by mouth at bedtime.     . metoprolol tartrate (LOPRESSOR) 100 MG tablet Take 1 tablet (100 mg total) by mouth 2 (two) times daily. 180 tablet 3   . nitroGLYCERIN (NITROSTAT) 0.4 MG SL tablet PLACE 1 TABLET UNDER THE TONGUE AT ONSET OF CHEST PAIN EVERY 5 MINTUES UP TO 3 TIMES AS NEEDED 25 tablet 0  . TURMERIC PO Take 1 capsule by mouth daily after breakfast.     No current facility-administered medications for this visit.   Facility-Administered Medications Ordered in Other Visits  Medication Dose Route Frequency Provider Last Rate Last Admin  . DOBUTamine (DOBUTREX) 1,000 mcg/mL in dextrose 5% 250 mL infusion  20 mcg/kg/min Intravenous Continuous Dorothy Spark, MD 96.4 mL/hr at 12/24/15 0840 20 mcg/kg/min at 12/24/15 0840    Allergies:   Fish allergy, Fish-derived products, Shellfish-derived products, Lasix [furosemide], Macrodantin [nitrofurantoin macrocrystal], Vicodin [hydrocodone-acetaminophen], Zetia [ezetimibe], Codeine, Latex, Niacin,  Niaspan [niacin er], Statins, and Sulfa antibiotics    ROS:  Please see the history of present illness.   Otherwise, review of systems are positive for mild neuropathy.   All other systems are reviewed and negative.    PHYSICAL EXAM: VS:  BP (!) 144/84   Pulse (!) 112   Ht 5\' 3"  (1.6 m)   Wt 182 lb (82.6 kg)   BMI 32.24 kg/m  , BMI Body mass index is 32.24 kg/m. GENERAL:  Well appearing NECK:  No jugular venous distention, waveform within normal limits, carotid upstroke brisk and symmetric, no bruits, no thyromegaly LUNGS:  Clear to auscultation bilaterally CHEST:  Unremarkable HEART:  PMI not displaced or sustained,S1 and S2 within normal limits, no S3, no clicks, no rubs, no murmurs, irregular  ABD:  Flat, positive bowel sounds normal in frequency in pitch, no bruits, no rebound, no guarding, no midline pulsatile mass, no hepatomegaly, no splenomegaly EXT:  2 plus pulses throughout, mild non pitting right nonpitting edema, no cyanosis no clubbing, varicosities and chronic venous stasis changes   EKG:  EKG is  ordered today. The ekg ordered today demonstrates lesion, rate 112,  axis within normal limits, intervals within normal limits, nonspecific inferolateral T wave changes   Recent Labs: 08/12/2019: ALT 25; BUN 13; Creatinine, Ser 0.81; Hemoglobin 11.8; Platelets 248; Potassium 4.7; Sodium 139 12/12/2019: TSH 4.330    Lipid Panel    Component Value Date/Time   CHOL 144 08/12/2019 1043   TRIG 224 (H) 08/12/2019 1043   TRIG 414 (H) 07/31/2016 0942   HDL 46 08/12/2019 1043   HDL 49 07/31/2016 0942   CHOLHDL 3.1 08/12/2019 1043   CHOLHDL 3.8 04/16/2019 0508   VLDL 26 04/16/2019 0508   LDLCALC 62 08/12/2019 1043   LDLCALC 205 (H) 11/28/2013 1215   LDLDIRECT 193 (H) 10/21/2016 0902      Wt Readings from Last 3 Encounters:  07/04/20 182 lb (82.6 kg)  05/09/20 184 lb (83.5 kg)  01/27/20 181 lb (82.1 kg)      Other studies Reviewed: Additional studies/ records that were reviewed today include: Zio patch. Review of the above records demonstrates:  Please see elsewhere in the note.     ASSESSMENT AND PLAN:  Lower extremity edema:  This is chronic and unchanged.  No change in therapy.   Coronary artery disease:She is not having angina.  No further testing.  We are continuing with risk reduction.    Atrial fibrillation:  Gabriela Smith has a CHA2DS2 - VASc score of 5.  She tolerates anticoagulation.  We had a long conversation about rhythm control versus rate control.  She really does not feel this.  She is totally asymptomatic.  I think we can pursue rate control.  I am going to increase her metoprolol to 100 mg twice daily.  She will continue with anticoagulation.  Essential hypertension:Blood pressure is at target.  No change in therapy.   Dyslipidemia:  She has been unable to afford Repatha.  However, she is currently on the assistance program.  We will continue this med.  I will check a fasting lipid profile.  I will also get a CBC and basic metabolic profile.    Current medicines are reviewed at length with the patient today.   The patient does not have concerns regarding medicines.  The following changes have been made: As above  Labs/ tests ordered today include:   Orders Placed This Encounter  Procedures  . Lipid panel  .  CBC  . Basic metabolic panel  . EKG 12-Lead     Disposition:   FU with me in 6 months.     Signed, Minus Breeding, MD  07/04/2020 11:43 AM    [facility]

## 2020-07-04 ENCOUNTER — Ambulatory Visit: Payer: Medicare Other | Admitting: Cardiology

## 2020-07-04 ENCOUNTER — Other Ambulatory Visit: Payer: Self-pay

## 2020-07-04 ENCOUNTER — Encounter: Payer: Self-pay | Admitting: Cardiology

## 2020-07-04 VITALS — BP 144/84 | HR 112 | Ht 63.0 in | Wt 182.0 lb

## 2020-07-04 DIAGNOSIS — R609 Edema, unspecified: Secondary | ICD-10-CM | POA: Diagnosis not present

## 2020-07-04 DIAGNOSIS — I10 Essential (primary) hypertension: Secondary | ICD-10-CM

## 2020-07-04 DIAGNOSIS — I4819 Other persistent atrial fibrillation: Secondary | ICD-10-CM | POA: Diagnosis not present

## 2020-07-04 DIAGNOSIS — Z79899 Other long term (current) drug therapy: Secondary | ICD-10-CM | POA: Diagnosis not present

## 2020-07-04 DIAGNOSIS — E785 Hyperlipidemia, unspecified: Secondary | ICD-10-CM

## 2020-07-04 DIAGNOSIS — I2510 Atherosclerotic heart disease of native coronary artery without angina pectoris: Secondary | ICD-10-CM | POA: Diagnosis not present

## 2020-07-04 MED ORDER — METOPROLOL TARTRATE 100 MG PO TABS: 100.0000 mg | ORAL_TABLET | Freq: Two times a day (BID) | ORAL | 3 refills | Status: DC

## 2020-07-04 NOTE — Patient Instructions (Signed)
Medication Instructions:  Please increase your Metoprolol Tartrate to 100 mg one tablet twice a day. Continue all other medications as listed.  *If you need a refill on your cardiac medications before your next appointment, please call your pharmacy*  Lab Work: Please have fasting blood work at [facility] (Lipid, CBC, BMP)  If you have labs (blood work) drawn today and your tests are completely normal, you will receive your results only by: [name] MyChart Message (if you have MyChart) OR . A paper copy in the mail If you have any lab test that is abnormal or we need to change your treatment, we will call you to review the results.  Follow-Up: At [facility], you and your health needs are our priority.  As part of our continuing mission to provide you with exceptional heart care, we have created designated Provider Care Teams.  These Care Teams include your primary Cardiologist (physician) and Advanced Practice Providers (APPs -  Physician Assistants and Nurse Practitioners) who all work together to provide you with the care you need, when you need it.  We recommend signing up for the patient portal called "MyChart".  Sign up information is provided on this After Visit Summary.  MyChart is used to connect with patients for Virtual Visits (Telemedicine).  Patients are able to view lab/test results, encounter notes, upcoming appointments, etc.  Non-urgent messages can be sent to your provider as well.   To learn more about what you can do with MyChart, go to [email or web address].    Your next appointment:   6 month(s)  The format for your next appointment:   In Person  Provider:   Minus Breeding, MD   Thank you for choosing [facility]!!

## 2020-07-05 ENCOUNTER — Ambulatory Visit: Payer: Medicare Other | Admitting: Family Medicine

## 2020-07-06 ENCOUNTER — Telehealth: Payer: Self-pay | Admitting: Internal Medicine

## 2020-07-06 NOTE — Telephone Encounter (Signed)
-----   Message from Shellia Cleverly, RN sent at 07/04/2020 12:02 PM EDT ----- Regarding: Repatha assistance Gabriela Smith,  This patient is asking if you have heard anything back regarding her pt assistance through the [facility] for [place].  I didn't see any documentation so I told her I would ask you.  Thanks so much  Pam

## 2020-07-06 NOTE — Telephone Encounter (Signed)
Left message for patient that on 06/05/20 MyChart message was sent with info on how to re-apply for healthwell [employer] grant. Asked her to call back if she needs additional assistance.

## 2020-07-09 ENCOUNTER — Telehealth: Payer: Self-pay | Admitting: Cardiology

## 2020-07-09 NOTE — Telephone Encounter (Signed)
Pt c/o medication issue:  1. Name of Medication:  metoprolol tartrate (LOPRESSOR) 100 MG tablet  2. How are you currently taking this medication (dosage and times per day)?  1 tablet (100 mg total) by mouth 2 (two) times daily  3. Are you having a reaction (difficulty breathing--STAT)? Yes   4. What is your medication issue?  Patient states ever since her Metoprolol was increased from 50 MG to 100 MG 2 times daily, she has been extremely weak, having headaches, and SOB   Pt c/o Shortness Of Breath: STAT if SOB developed within the last 24 hours or pt is noticeably SOB on the phone  1. Are you currently SOB (can you hear that pt is SOB on the phone)?  No   2. How long have you been experiencing SOB? About 1 week (since Metoprolol increase)  3. Are you SOB when sitting or when up moving around? Both   4. Are you currently experiencing any other symptoms? Headaches, lack of energy

## 2020-07-09 NOTE — Telephone Encounter (Signed)
Called and spoke with pt who is c/o feeling very sluggish, a dull headache, head fullness (worse with bending over) and feeling a little more SOB than normal.  She reports she believes it is from the increase in her Metoprolol tartrate to 100 mg BID when seen last week in [place].  Advised pt the increase in Metoprolol dose could very well cause her to feel more sluggish however generally as her body adjusts to the medication change this will improve.  She states understanding.  BP today was 130/70 and HR 90 per her report.   She reports having allergy like symptoms with some nasal congestion and feeling of drainage in her throat.  It is not sore.  Also c/o feeling a little more SOB than normal.  Advised she can use saline nasal washes to help clear the congestion.  She has not taken any OTC allergy medications.  She stated "This pollen is crazy".  Advised she can f/u with her PCP regarding this.  Pt states understanding.  She will continue to monitor her BP/HR and let us know if no improvement in feeling sluggish.

## 2020-07-09 NOTE — Telephone Encounter (Signed)
FYI--Patient's daughter is returned call. I informed her, per previous message, the instructions on how to re-apply got healthwell foundation grant may be found in 03/29 MyChart message. She states she will look for it when she gets home and call if she has any additional issues/questions.

## 2020-07-12 ENCOUNTER — Telehealth: Payer: Self-pay | Admitting: Internal Medicine

## 2020-07-12 NOTE — Telephone Encounter (Signed)
Pt c/o medication issue:  1. Name of Medication: Metoprolol  2. How are you currently taking this medication (dosage and times per day)? 2 times a day  3. Are you having a reaction (difficulty breathing--STAT)? no  4. What is your medication issue? Head feels like it is going to explode, terrible headach and  no energy

## 2020-07-12 NOTE — Telephone Encounter (Signed)
OK to reduce to 50 mg bid but she needs to keep track of her heart rate.

## 2020-07-12 NOTE — Telephone Encounter (Signed)
Spoke with Gabriela Smith, she has had a bad headache ever since she increased the metoprolol to 100 mg twice daily. She is having to take tylenol every 6 hours. She feels like she can not tolerate the increased dose. She has already taken her medications this morning and she was instructed to take 1/2 tablet or 50 mg for the evening dose today and I will forward this message to dr hochrein for advise. Gabriela Smith agreed with this plan.

## 2020-07-12 NOTE — Telephone Encounter (Signed)
Patient approved for $2500 co-pay assistance grant from [place] 08/08/2020 - 08/07/2021  Healthwell ID: [id number]  Pharmacy card ID: [id number] PC Group: [id number] PC BIN: [id number] PC PCN: [id number] PC Processor: PDMI

## 2020-07-13 ENCOUNTER — Telehealth: Payer: Self-pay | Admitting: Cardiology

## 2020-07-13 ENCOUNTER — Other Ambulatory Visit: Payer: Self-pay

## 2020-07-13 ENCOUNTER — Ambulatory Visit (INDEPENDENT_AMBULATORY_CARE_PROVIDER_SITE_OTHER): Payer: Medicare Other | Admitting: Family Medicine

## 2020-07-13 ENCOUNTER — Encounter: Payer: Self-pay | Admitting: Family Medicine

## 2020-07-13 VITALS — BP 152/88 | HR 87 | Temp 98.1°F | Ht 63.0 in | Wt 181.6 lb

## 2020-07-13 DIAGNOSIS — G4441 Drug-induced headache, not elsewhere classified, intractable: Secondary | ICD-10-CM | POA: Diagnosis not present

## 2020-07-13 DIAGNOSIS — Z0001 Encounter for general adult medical examination with abnormal findings: Secondary | ICD-10-CM | POA: Diagnosis not present

## 2020-07-13 DIAGNOSIS — I480 Paroxysmal atrial fibrillation: Secondary | ICD-10-CM

## 2020-07-13 DIAGNOSIS — I10 Essential (primary) hypertension: Secondary | ICD-10-CM | POA: Diagnosis not present

## 2020-07-13 DIAGNOSIS — I2510 Atherosclerotic heart disease of native coronary artery without angina pectoris: Secondary | ICD-10-CM

## 2020-07-13 DIAGNOSIS — Z0000 Encounter for general adult medical examination without abnormal findings: Secondary | ICD-10-CM

## 2020-07-13 DIAGNOSIS — C672 Malignant neoplasm of lateral wall of bladder: Secondary | ICD-10-CM | POA: Diagnosis not present

## 2020-07-13 DIAGNOSIS — I700 Atherosclerosis of aorta: Secondary | ICD-10-CM

## 2020-07-13 DIAGNOSIS — M85831 Other specified disorders of bone density and structure, right forearm: Secondary | ICD-10-CM | POA: Diagnosis not present

## 2020-07-13 MED ORDER — NURTEC 75 MG PO TBDP: ORAL_TABLET | ORAL | 0 refills | Status: DC

## 2020-07-13 MED ORDER — METOPROLOL TARTRATE 100 MG PO TABS: 50.0000 mg | ORAL_TABLET | Freq: Two times a day (BID) | ORAL | 3 refills | Status: DC

## 2020-07-13 MED ORDER — METHYLPREDNISOLONE ACETATE 40 MG/ML IJ SUSP
40.0000 mg | Freq: Once | INTRAMUSCULAR | Status: AC
  Administered 2020-07-13: 40 mg via INTRAMUSCULAR

## 2020-07-13 NOTE — Patient Instructions (Signed)
Monitor Blood pressure at home.  If >150/90, then proceed with 1.5 tablets of Amlodipine daily and see our nurse 2 weeks following for blood pressure recheck. If less than 150/90, ok to continue current medication regimen with no changes.  You were given a depomedrol shot today to help with headache  You were given a sample of Nurtec.  May dissolve 1 tablet in mouth once daily IF needed for headache.  DO NOT TAKE MORE THAN 2 grams TOTAL of tylenol per day.  You an poison yourself otherwise.  You had labs performed today.  You will be contacted with the results of the labs once they are available, usually in the next 3 business days for routine lab work.  If you have an active my chart account, they will be released to your MyChart.  If you prefer to have these labs released to you via telephone, please let us know.  If you had a pap smear or biopsy performed, expect to be contacted in about 7-10 days.  Preventive Care 82 Years and Older, Female Preventive care refers to lifestyle choices and visits with your health care provider that can promote health and wellness. This includes:  A yearly physical exam. This is also called an annual wellness visit.  Regular dental and eye exams.  Immunizations.  Screening for certain conditions.  Healthy lifestyle choices, such as: ? Eating a healthy diet. ? Getting regular exercise. ? Not using drugs or products that contain nicotine and tobacco. ? Limiting alcohol use. What can I expect for my preventive care visit? Physical exam Your health care provider will check your:  Height and weight. These may be used to calculate your BMI (body mass index). BMI is a measurement that tells if you are at a healthy weight.  Heart rate and blood pressure.  Body temperature.  Skin for abnormal spots. Counseling Your health care provider may ask you questions about your:  Past medical problems.  Family's medical history.  Alcohol, tobacco, and  drug use.  Emotional well-being.  Home life and relationship well-being.  Sexual activity.  Diet, exercise, and sleep habits.  History of falls.  Memory and ability to understand (cognition).  Work and work Statistician.  Pregnancy and menstrual history.  Access to firearms. What immunizations do I need? Vaccines are usually given at various ages, according to a schedule. Your health care provider will recommend vaccines for you based on your age, medical history, and lifestyle or other factors, such as travel or where you work.   What tests do I need? Blood tests  Lipid and cholesterol levels. These may be checked every 5 years, or more often depending on your overall health.  Hepatitis C test.  Hepatitis B test. Screening  Lung cancer screening. You may have this screening every year starting at age 24 if you have a 30-pack-year history of smoking and currently smoke or have quit within the past 15 years.  Colorectal cancer screening. ? All adults should have this screening starting at age 63 and continuing until age 28. ? Your health care provider may recommend screening at age 74 if you are at increased risk. ? You will have tests every 1-10 years, depending on your results and the type of screening test.  Diabetes screening. ? This is done by checking your blood sugar (glucose) after you have not eaten for a while (fasting). ? You may have this done every 1-3 years.  Mammogram. ? This may be done every 1-2 years. ?  Talk with your health care provider about how often you should have regular mammograms.  Abdominal aortic aneurysm (AAA) screening. You may need this if you are a current or former smoker.  BRCA-related cancer screening. This may be done if you have a family history of breast, ovarian, tubal, or peritoneal cancers. Other tests  STD (sexually transmitted disease) testing, if you are at risk.  Bone density scan. This is done to screen for  osteoporosis. You may have this done starting at age 35. Talk with your health care provider about your test results, treatment options, and if necessary, the need for more tests. Follow these instructions at home: Eating and drinking  Eat a diet that includes fresh fruits and vegetables, whole grains, lean protein, and low-fat dairy products. Limit your intake of foods with high amounts of sugar, saturated fats, and salt.  Take vitamin and mineral supplements as recommended by your health care provider.  Do not drink alcohol if your health care provider tells you not to drink.  If you drink alcohol: ? Limit how much you have to 0-1 drink a day. ? Be aware of how much alcohol is in your drink. In the [place] one drink equals one 12 oz bottle of beer (355 mL), one 5 oz glass of wine (148 mL), or one 1 oz glass of hard liquor (44 mL).   Lifestyle  Take daily care of your teeth and gums. Brush your teeth every morning and night with fluoride toothpaste. Floss one time each day.  Stay active. Exercise for at least 30 minutes 5 or more days each week.  Do not use any products that contain nicotine or tobacco, such as cigarettes, e-cigarettes, and chewing tobacco. If you need help quitting, ask your health care provider.  Do not use drugs.  If you are sexually active, practice safe sex. Use a condom or other form of protection in order to prevent STIs (sexually transmitted infections).  Talk with your health care provider about taking a low-dose aspirin or statin.  Find healthy ways to cope with stress, such as: ? Meditation, yoga, or listening to music. ? Journaling. ? Talking to a trusted person. ? Spending time with friends and family. Safety  Always wear your seat belt while driving or riding in a vehicle.  Do not drive: ? If you have been drinking alcohol. Do not ride with someone who has been drinking. ? When you are tired or distracted. ? While texting.  Wear a helmet and  other protective equipment during sports activities.  If you have firearms in your house, make sure you follow all gun safety procedures. What's next?  Visit your health care provider once a year for an annual wellness visit.  Ask your health care provider how often you should have your eyes and teeth checked.  Stay up to date on all vaccines. This information is not intended to replace advice given to you by your health care provider. Make sure you discuss any questions you have with your health care provider. Document Revised: 02/15/2020 Document Reviewed: 02/18/2018 Elsevier Patient Education  2021 [employer].

## 2020-07-13 NOTE — Progress Notes (Signed)
 Gabriela Smith is a 79 y.o. female presents to office today for annual physical exam examination.    Concerns today include: 1. Headache Patient reports onset of headache about 1 week ago after her Metoprolol was increased to 100mg.  She notes that it has since been reduced back to 50mg and headache is improving but not totally better.  No visual disturbance, dizziness, falls, numbness.  Headache refractory to Tylenol every 4 hours. Cannot take NSAIDs due to chronic anticoagulation.  Occupation: retired  Immunizations needed:  Immunization History  Administered Date(s) Administered  . Fluad Quad(high Dose 65+) 11/17/2018, 12/09/2019  . Influenza, High Dose Seasonal PF 12/07/2015, 12/16/2016, 12/21/2017  . Influenza,inj,Quad PF,6+ Mos 12/21/2012, 01/04/2014, 12/25/2014  . PFIZER(Purple Top)SARS-COV-2 Vaccination 03/30/2019, 06/25/2019, 11/29/2019  . Pneumococcal Conjugate-13 06/28/2014  . Pneumococcal Polysaccharide-23 11/04/2012  . Tdap 10/09/2010  . Zoster 07/15/2006     Past Medical History:  Diagnosis Date  . AAA (abdominal aortic aneurysm) (HCC)    cardiology aware   . Arthritis    OA lt knee- cortizone inj. q 4 months AND OA ALSO IN BACK. Had knee replaced-issue resolved  . CAD (coronary artery disease) CARDIOLOGIST- DR HOCHREIN--  VISIT 01-02-10 IN [facility]   1993-- PTCA. Pt describes a near total blockage of apparently the LAD and a 65% stenosis elsewhere.  . Cancer (HCC)   . History of bladder cancer followed by dr ottelin   hx  TCC of bladder ,  Ta G3-  first occurence 02-20-2012--  s/p BCG tx's  and  mitomycin C  . Hyperlipidemia 1993  . Hypertension 1993  . Nocturia   . Osteopenia   . Sigmoid diverticulosis    Social History   Socioeconomic History  . Marital status: Married    Spouse name: Not on file  . Number of children: 3  . Years of education: 12  . Highest education level: High school graduate  Occupational History  . Occupation: retired  Tobacco Use   . Smoking status: Former Smoker    Packs/day: 0.50    Years: 5.00    Pack years: 2.50    Types: Cigarettes    Quit date: 03/09/1976    Years since quitting: 44.3  . Smokeless tobacco: Never Used  Vaping Use  . Vaping Use: Never used  Substance and Sexual Activity  . Alcohol use: Not Currently    Comment: red wine  . Drug use: No  . Sexual activity: Not Currently  Other Topics Concern  . Not on file  Social History Narrative   Married with 3 children and 4 grandchildren.  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 with Parkinsons   Social Determinants of Health   Financial Resource Strain: Low Risk   . Difficulty of Paying Living Expenses: Not hard at all  Food Insecurity: No Food Insecurity  . Worried About Running Out of Food in the Last Year: Never true  . Ran Out of Food in the Last Year: Never true  Transportation Needs: No Transportation Needs  . Lack of Transportation (Medical): No  . Lack of Transportation (Non-Medical): No  Physical Activity: Insufficiently Active  . Days of Exercise per Week: 3 days  . Minutes of Exercise per Session: 30 min  Stress: No Stress Concern Present  . Feeling of Stress : Not at all  Social Connections: Socially Integrated  . Frequency of Communication with Friends and Family: More than three times a week  . Frequency of Social Gatherings with Friends and Family: More than three   times a week  . Attends Religious Services: More than 4 times per year  . Active Member of Clubs or Organizations: Yes  . Attends Club or Organization Meetings: 1 to 4 times per year  . Marital Status: Married  Intimate Partner Violence: Not At Risk  . Fear of Current or Ex-Partner: No  . Emotionally Abused: No  . Physically Abused: No  . Sexually Abused: No   Past Surgical History:  Procedure Laterality Date  . CHOLECYSTECTOMY  2003 (approx)  . CLIPPING OF ATRIAL APPENDAGE N/A 04/25/2019   Procedure: Clipping Of Atrial Appendage;  Surgeon: Lightfoot, Golab O, MD;   Location: [facility];  Service: Open Heart Surgery;  Laterality: N/A;  . COLONOSCOPY  05-31-2003  . CORONARY ANGIOPLASTY  1993   to LAD  . CORONARY ARTERY BYPASS GRAFT N/A 04/25/2019   Procedure: CORONARY ARTERY BYPASS GRAFTING (CABG) times four using left internal mammary artery and left greater saphenous vein harvested endoscipically.;  Surgeon: Lightfoot, Hildenbrand O, MD;  Location: [facility];  Service: Open Heart Surgery;  Laterality: N/A;  . CYSTOSCOPY WITH BIOPSY  05/09/2011   Procedure: CYSTOSCOPY WITH BIOPSY;  Surgeon: Mark C Ottelin, MD;  Location: [facility];  Service: Urology;  Laterality: N/A;  WITH BLADDER BIOPSY GYRUS  . LEFT HEART CATH AND CORONARY ANGIOGRAPHY N/A 04/19/2019   Procedure: LEFT HEART CATH AND CORONARY ANGIOGRAPHY;  Surgeon: Harding, David W, MD;  Location: [facility];  Service: Cardiovascular;  Laterality: N/A;  . TOTAL HIP ARTHROPLASTY Right 12/25/2015   Procedure: RIGHT TOTAL HIP ARTHROPLASTY ANTERIOR APPROACH;  Surgeon: Matthew Olin, MD;  Location: [facility];  Service: Orthopedics;  Laterality: Right;  . TOTAL KNEE ARTHROPLASTY Left 11/22/2012   Procedure: LEFT TOTAL KNEE ARTHROPLASTY;  Surgeon: Matthew D Olin, MD;  Location: [facility];  Service: Orthopedics;  Laterality: Left;  . TRANSURETHRAL RESECTION OF BLADDER TUMOR  03/14/2011   Procedure: TRANSURETHRAL RESECTION OF BLADDER TUMOR (TURBT);  Surgeon: Mark C Ottelin, MD;  Location: [facility];  Service: Urology;  Laterality: N/A;  . TRANSURETHRAL RESECTION OF BLADDER TUMOR  05/09/2011   Procedure: TRANSURETHRAL RESECTION OF BLADDER TUMOR (TURBT);  Surgeon: Mark C Ottelin, MD;  Location: [facility];  Service: Urology;  Laterality: N/A;  . TRANSURETHRAL RESECTION OF BLADDER TUMOR  03/08/2012   Procedure: TRANSURETHRAL RESECTION OF BLADDER TUMOR (TURBT);  Surgeon: Mark C Ottelin, MD;  Location: [facility];  Service: Urology;  Laterality: N/A;  . TRANSURETHRAL RESECTION  OF BLADDER TUMOR WITH GYRUS (TURBT-GYRUS) N/A 03/24/2014   Procedure: TRANSURETHRAL RESECTION OF BLADDER TUMOR WITH GYRUS (TURBT-GYRUS);  Surgeon: Mark C Ottelin, MD;  Location: [facility];  Service: Urology;  Laterality: N/A;  . UMBILICAL HERNIA REPAIR  2001  (approx)   Family History  Problem Relation Age of Onset  . Coronary artery disease Father 70  . Hyperlipidemia Father   . Heart attack Mother 79  . Hyperlipidemia Mother   . Osteoporosis Sister   . Hyperlipidemia Sister   . Heart attack Daughter 45       CABG x 5  . Heart disease Son 45       Stents x 3    Current Outpatient Medications:  .  acetaminophen (TYLENOL) 500 MG tablet, Take 2 tablets (1,000 mg total) by mouth every 8 (eight) hours., Disp: 30 tablet, Rfl: 0 .  amLODipine (NORVASC) 5 MG tablet, Take 1 tablet (5 mg total) by mouth daily., Disp: 90 tablet, Rfl:   3 .  apixaban (ELIQUIS) 5 MG TABS tablet, Take 1 tablet (5 mg total) by mouth 2 (two) times daily., Disp: 180 tablet, Rfl: 1 .  aspirin EC 81 MG EC tablet, Take 1 tablet (81 mg total) by mouth daily., Disp:  , Rfl:  .  cholecalciferol (VITAMIN D) 1000 UNITS tablet, Take 1,000 Units by mouth daily after breakfast. , Disp: , Rfl:  .  Co-Enzyme Q10 200 MG CAPS, Take 200 mg by mouth daily after breakfast. , Disp: , Rfl:  .  digoxin (LANOXIN) 0.125 MG tablet, Take 1 tablet (125 mcg total) by mouth daily., Disp: 90 tablet, Rfl: 1 .  Evolocumab (REPATHA SURECLICK) 751 MG/ML SOAJ, Inject 1 Dose into the skin every 14 (fourteen) days. Fasting lab work and appointment with Dr. Debara Pickett needed for refills. Thanks, Disp: 2 mL, Rfl: 1 .  Flaxseed, Linseed, (FLAX SEED OIL) 1000 MG CAPS, Take 1,000 mg by mouth daily after breakfast. , Disp: , Rfl:  .  gabapentin (NEURONTIN) 100 MG capsule, Take 2 capsules (200 mg total) by mouth at bedtime., Disp: 180 capsule, Rfl: 0 .  GNP VITAMIN B-12 500 MCG tablet, TAKE 1 TABLET DAILY, Disp: 90 tablet, Rfl: 0 .  levothyroxine  (SYNTHROID) 50 MCG tablet, Take 1 tablet (50 mcg total) by mouth daily., Disp: 90 tablet, Rfl: 3 .  Magnesium 300 MG CAPS, Take 300 mg by mouth at bedtime. , Disp: , Rfl:  .  metoprolol tartrate (LOPRESSOR) 100 MG tablet, Take 1 tablet (100 mg total) by mouth 2 (two) times daily., Disp: 180 tablet, Rfl: 3 .  nitroGLYCERIN (NITROSTAT) 0.4 MG SL tablet, PLACE 1 TABLET UNDER THE TONGUE AT ONSET OF CHEST PAIN EVERY 5 MINTUES UP TO 3 TIMES AS NEEDED, Disp: 25 tablet, Rfl: 0 .  TURMERIC PO, Take 1 capsule by mouth daily after breakfast., Disp: , Rfl:  No current facility-administered medications for this visit.  Facility-Administered Medications Ordered in Other Visits:  .  DOBUTamine (DOBUTREX) 1,000 mcg/mL in dextrose 5% 250 mL infusion, 20 mcg/kg/min, Intravenous, Continuous, Dorothy Spark, MD, Last Rate: 96.4 mL/hr at 12/24/15 0840, 20 mcg/kg/min at 12/24/15 0840  Allergies  Allergen Reactions  . Fish Allergy Hives, Shortness Of Breath and Other (See Comments)    "Bottom-feeder fish"  . Fish-Derived Products Hives, Shortness Of Breath and Other (See Comments)    Bottom-feeder fish  . Shellfish-Derived Products Hives and Shortness Of Breath  . Lasix [Furosemide] Swelling and Other (See Comments)    Edema, elevated BP  . Macrodantin [Nitrofurantoin Macrocrystal] Other (See Comments)    Unknown reaction   . Vicodin [Hydrocodone-Acetaminophen] Nausea And Vomiting and Other (See Comments)    SEVERE N/V  . Zetia [Ezetimibe] Other (See Comments)    Muscle aches  . Codeine Rash  . Latex Itching and Rash    Reports Elastic in underwear, under wire bras, and now surgical cap cause rash and itching.   . Niacin Other (See Comments)    Muscle aches  . Niaspan [Niacin Er] Other (See Comments)    Muscle aches  . Statins Other (See Comments)    Muscle aches/ cramps  . Sulfa Antibiotics Rash and Other (See Comments)    Muscle aches/ cramps, also     ROS: Review of Systems Pertinent items  noted in HPI and remainder of comprehensive ROS otherwise negative.    Physical exam BP (!) 152/88   Pulse 87   Temp 98.1 F (36.7 C)   Ht 5' 3" (1.6  m)   Wt 181 lb 9.6 oz (82.4 kg)   SpO2 97%   BMI 32.17 kg/m  General appearance: alert, cooperative, appears stated age and no distress Head: Normocephalic, without obvious abnormality, atraumatic Eyes: negative findings: lids and lashes normal, conjunctivae and sclerae normal, corneas clear and pupils equal, round, reactive to light and accomodation Ears: normal TM's and external ear canals both ears Nose: Nares normal. Septum midline. Mucosa normal. No drainage or sinus tenderness. Throat: lips, mucosa, and tongue normal; teeth and gums normal Neck: no adenopathy, no carotid bruit, supple, symmetrical, trachea midline and thyroid not enlarged, symmetric, no tenderness/mass/nodules Back: symmetric, no curvature. ROM normal. No CVA tenderness. Lungs: clear to auscultation bilaterally Heart: irregularly irregular Abdomen: soft, non-tender; bowel sounds normal; no masses,  no organomegaly Extremities: extremities normal, atraumatic, no cyanosis or edema Pulses: 2+ and symmetric Skin: Skin color, texture, turgor normal. No rashes or lesions Lymph nodes: Cervical, supraclavicular, and axillary nodes normal. Neurologic: Alert and oriented X 3, normal strength and tone. Normal symmetric reflexes. Normal coordination and gait    Assessment/ Plan: Gabriela Smith here for annual physical exam.   Annual physical exam  Atherosclerosis of native coronary artery of native heart without angina pectoris - Plan: CMP14+EGFR, Lipid Panel, CANCELED: Lipid Panel  Aortic atherosclerosis (HCC) - Plan: CMP14+EGFR, Lipid Panel, CANCELED: Lipid Panel  Paroxysmal A-fib (HCC) - Plan: CBC, Digoxin level, TSH, CANCELED: TSH, CANCELED: Digoxin level  Hypertension - Plan: CMP14+EGFR  Malignant neoplasm of lateral wall of urinary bladder (HCC) - Plan: CBC,  VITAMIN D 25 Hydroxy (Vit-D Deficiency, Fractures)  Osteopenia of right forearm - Plan: VITAMIN D 25 Hydroxy (Vit-D Deficiency, Fractures)  Intractable drug-induced headache, not elsewhere classified - Plan: methylPREDNISolone acetate (DEPO-MEDROL) injection 40 mg, Rimegepant Sulfate (NURTEC) 75 MG TBDP  Check nonfasting labs.  Will cc to Dr Hochrein  Rate controlled today. Continue current regimen  BP not at goal but unsure if headache causing or vice versa.  Depomedrol 40IM and nurtec given.  Monitor BP at home.  Goal <150/90.  If persistently elevated, advised to increase norvasc 7.5mg daily and f/u in office in 2 weeks for BP check  Counseled on healthy lifestyle choices, including diet (rich in fruits, vegetables and lean meats and low in salt and simple carbohydrates) and exercise (at least 30 minutes of moderate physical activity daily).  Patient to follow up in 1 year for annual exam or sooner if needed.   M. , DO      

## 2020-07-13 NOTE — Telephone Encounter (Signed)
Daughter called in on behalf of patient reporting persistent headache starting 2 days prior.  They associated increasing her metoprolol 100 mg twice daily with symptoms.  Daughter states patient was seen in PCP office today, and given IM prednisone and Nurtec.  In regards to her BP medications she had reduced her dose back to 50 mg twice daily after calling the office yesterday per Dr. Rosezella [place] advice.  Her amlodipine was increased from 5 mg to 7.5 mg today at PCP office.  She has had no focal symptoms of weakness, altered mental status, speech deficits.  Daughter reports current blood pressure 128/70.  I advised that she continue on her medications as it seems unlikely metoprolol is etiology of headache given she has been on this medication previously without issue.  Advised she could attempt to treat with caffeine and decrease stimulation i.e. dim lights, rest.  If no improvement in symptoms would suggest reevaluation over the weekend.  Daughter was agreeable and thanked me for follow-up.

## 2020-07-13 NOTE — Telephone Encounter (Signed)
Patient has been made aware. She will keep a log of her heart rates and call back if anything is needed. She did not need a refill yet.   PCP had decreased 50 mg bid today.

## 2020-07-18 ENCOUNTER — Telehealth: Payer: Self-pay

## 2020-07-18 NOTE — Telephone Encounter (Signed)
Patient aware that she may get COVID vaccine from the local [facility], [place] pharmacy

## 2020-07-28 ENCOUNTER — Other Ambulatory Visit: Payer: Self-pay | Admitting: Cardiology

## 2020-07-30 NOTE — Telephone Encounter (Signed)
Rx(s) sent to pharmacy electronically.  

## 2020-08-01 ENCOUNTER — Other Ambulatory Visit: Payer: Self-pay | Admitting: Internal Medicine

## 2020-08-02 ENCOUNTER — Ambulatory Visit (INDEPENDENT_AMBULATORY_CARE_PROVIDER_SITE_OTHER): Payer: Medicare Other | Admitting: Licensed Clinical Social Worker

## 2020-08-02 DIAGNOSIS — R7989 Other specified abnormal findings of blood chemistry: Secondary | ICD-10-CM

## 2020-08-02 DIAGNOSIS — I4819 Other persistent atrial fibrillation: Secondary | ICD-10-CM | POA: Diagnosis not present

## 2020-08-02 DIAGNOSIS — E785 Hyperlipidemia, unspecified: Secondary | ICD-10-CM

## 2020-08-02 DIAGNOSIS — I5031 Acute diastolic (congestive) heart failure: Secondary | ICD-10-CM | POA: Diagnosis not present

## 2020-08-02 DIAGNOSIS — I739 Peripheral vascular disease, unspecified: Secondary | ICD-10-CM

## 2020-08-02 DIAGNOSIS — M5136 Other intervertebral disc degeneration, lumbar region: Secondary | ICD-10-CM

## 2020-08-02 DIAGNOSIS — I714 Abdominal aortic aneurysm, without rupture, unspecified: Secondary | ICD-10-CM

## 2020-08-02 DIAGNOSIS — Z951 Presence of aortocoronary bypass graft: Secondary | ICD-10-CM

## 2020-08-02 DIAGNOSIS — I10 Essential (primary) hypertension: Secondary | ICD-10-CM

## 2020-08-02 NOTE — Chronic Care Management (AMB) (Signed)
Chronic Care Management    Clinical Social Work Note  08/02/2020 Name: Gabriela Smith MRN: [id number] DOB: [birth year]  Gabriela Smith is a 80 y.o. year old female who is a primary care patient of [name], DO. The CCM team was consulted to assist the patient with chronic disease management and/or care coordination needs related to: [employer] .   Engaged with patient by telephone for follow up visit in response to provider referral for social work chronic care management and care coordination services.   Consent to Services:  The patient was given information about Chronic Care Management services, agreed to services, and gave verbal consent prior to initiation of services.  Please see initial visit note for detailed documentation.   Patient agreed to services and consent obtained.   Assessment: Review of patient past medical history, allergies, medications, and health status, including review of relevant consultants reports was performed today as part of a comprehensive evaluation and provision of chronic care management and care coordination services.     SDOH (Social Determinants of Health) assessments and interventions performed:  SDOH Interventions   Flowsheet Row Most Recent Value  SDOH Interventions   Depression Interventions/Treatment  --  [informed client of LCSW support and of RNCM support]       Advanced Directives Status: See Vynca application for related entries.  CCM Care Plan  Allergies  Allergen Reactions  . Fish Allergy Hives, Shortness Of Breath and Other (See Comments)    "Bottom-feeder fish"  . Fish-Derived Products Hives, Shortness Of Breath and Other (See Comments)    Bottom-feeder fish  . Shellfish-Derived Products Hives and Shortness Of Breath  . Lasix [Furosemide] Swelling and Other (See Comments)    Edema, elevated BP  . Macrodantin [Nitrofurantoin Macrocrystal] Other (See Comments)    Unknown reaction   . Vicodin  [Hydrocodone-Acetaminophen] Nausea And Vomiting and Other (See Comments)    SEVERE N/V  . Zetia [Ezetimibe] Other (See Comments)    Muscle aches  . Codeine Rash  . Latex Itching and Rash    Reports Elastic in underwear, under wire bras, and now surgical cap cause rash and itching.   . Niacin Other (See Comments)    Muscle aches  . Niaspan [Niacin Er] Other (See Comments)    Muscle aches  . Statins Other (See Comments)    Muscle aches/ cramps  . Sulfa Antibiotics Rash and Other (See Comments)    Muscle aches/ cramps, also    Outpatient Encounter Medications as of 08/02/2020  Medication Sig  . acetaminophen (TYLENOL) 500 MG tablet Take 2 tablets (1,000 mg total) by mouth every 8 (eight) hours.  [name] amLODipine (NORVASC) 5 MG tablet Take 1 tablet (5 mg total) by mouth daily.  [name] apixaban (ELIQUIS) 5 MG TABS tablet Take 1 tablet (5 mg total) by mouth 2 (two) times daily.  [name] aspirin EC 81 MG EC tablet Take 1 tablet (81 mg total) by mouth daily.  . cholecalciferol (VITAMIN D) 1000 UNITS tablet Take 1,000 Units by mouth daily after breakfast.   . Co-Enzyme Q10 200 MG CAPS Take 200 mg by mouth daily after breakfast.   . digoxin (LANOXIN) 0.125 MG tablet Take 1 tablet (125 mcg total) by mouth daily.  . Flaxseed, Linseed, (FLAX SEED OIL) 1000 MG CAPS Take 1,000 mg by mouth daily after breakfast.   . gabapentin (NEURONTIN) 100 MG capsule Take 2 capsules (200 mg total) by mouth at bedtime.  [name] GNP VITAMIN B-12 500 MCG tablet  TAKE 1 TABLET DAILY  . levothyroxine (SYNTHROID) 50 MCG tablet Take 1 tablet (50 mcg total) by mouth daily.  . Magnesium 300 MG CAPS Take 300 mg by mouth at bedtime.   . metoprolol tartrate (LOPRESSOR) 100 MG tablet Take 0.5 tablets (50 mg total) by mouth 2 (two) times daily.  . nitroGLYCERIN (NITROSTAT) 0.4 MG SL tablet PLACE 1 TABLET UNDER THE TONGUE AT ONSET OF CHEST PAIN EVERY 5 MINTUES UP TO 3 TIMES AS NEEDED  . REPATHA SURECLICK 357 MG/ML SOAJ INJECT 140MG  (1ML) EVERY 2  WEEKS  . Rimegepant Sulfate (NURTEC) 75 MG TBDP Dissolve 1 tablet in mouth ONCE per day if needed for headache  . TURMERIC PO Take 1 capsule by mouth daily after breakfast.   Facility-Administered Encounter Medications as of 08/02/2020  Medication  . DOBUTamine (DOBUTREX) 1,000 mcg/mL in dextrose 5% 250 mL infusion    Patient Active Problem List   Diagnosis Date Noted  . Persistent atrial fibrillation ([place]) 07/03/2020  . Edema 07/03/2020  . Paroxysmal A-fib ([place]) 08/12/2019  . S/P CABG x 4 04/26/2019  . Acute CHF (congestive heart failure) ([place]) 04/15/2019  . Disease related peripheral neuropathy 03/07/2019  . BMI 33.0-33.9,adult 02/17/2019  . Intermittent claudication ([place]) 02/17/2019  . Peripheral vascular insufficiency ([place]) 02/17/2019  . Aortic atherosclerosis ([place]) 02/11/2017  . Atrophic vaginitis 09/18/2016  . S/P right THA, AA 12/25/2015  . S/P hip replacement 12/25/2015  . AAA (abdominal aortic aneurysm) without rupture ([place]) 08/24/2015  . Statin intolerance 05/23/2013  . Osteopenia of right forearm 04/20/2013  . Degenerative disc disease, lumbar 02/08/2013  . Vitamin D deficiency 02/08/2013  . Overweight (BMI 25.0-29.9) 11/24/2012  . S/P left TKA 11/22/2012  . Malignant neoplasm of urinary bladder ([place]) 11/04/2012  . Osteoarthritis of left knee 11/04/2012  . Hyperlipidemia LDL goal <70 11/28/2008  . Hypertension 11/28/2008  . CORONARY ATHEROSCLEROSIS NATIVE CORONARY ARTERY 11/28/2008    Conditions to be addressed/monitored:Monitor client management of medical needs   Care Plan : LCSW care plan  Updates made by Katha Cabal, LCSW since 08/02/2020 12:00 AM      Problem: Coping Skills (General Plan of Care)     Goal: Coping Skills Enhanced;Manage Medical Needs   Start Date: 08/02/2020  Expected End Date: 11/02/2020  This Visit's Progress: On track  Priority: Medium  Note:   Current barriers:   . Patient in need of assistance with connecting to community  resources for possible help in managing medical needs . Patient is unable to independently navigate community resource options without care coordination support . Mobility issues . Some pain issues  Clinical Goals:  patient will work with SW in next 30 days to address concerns related to client management of ongoing medical needs Client to call RNCM or LCSW as needed in next 30 days for CCM support   Clinical Interventions:  . Collaboration with [name], DO regarding development and update of comprehensive plan of care as evidenced by provider attestation and co-signature . Talked with client about needs of client . Talked with client about appetite of client . Talked with client about sleeping issues of client . Talked with client about mobility of client . Talked with client about pain issues of client . Talked with client about upcoming client medical appointments . Talked with client about expense of Eliquis procurement . Talked with client about her next appointment with cardiologist . Talked with client about vision of client . Talked with client about mood of client .  Encouraged client to call RNCM as needed for nursing support  Patient Strengths/Coping Skills: Attends scheduled medical appointments Takes medications as prescribed Has support from daughter, Neita Goodnight  Patient Defictis Mobility issues Some difficulties in completing ADLs Some Pain issues  Patient Goals:  Patient to attend scheduled medical appointments in next 30 days Patient to talk regularly with her daughter, Amy, in next 30 days about needs of client Client to call RNCM or LCSW as needed in next 30 days for CCM support  Follow Up Plan: LCSW to call client on 09/17/20     [name].Silvano Garofano MSW, LCSW Licensed Clinical Social Worker [facility] [phone]

## 2020-08-02 NOTE — Patient Instructions (Signed)
Visit Information  PATIENT GOALS: Goals Addressed            This Visit's Progress   . Protect My Health;Manage Medical Needs       Timeframe:  Short-Term Goal Priority:  Medium Progress: On Track Start Date:             08/02/20                Expected End Date:           11/02/20            Follow Up Date 09/17/20   Protect My Health (Patient)  Manage Medical needs    Why is this important?    Screening tests can find diseases early when they are easier to treat.   Your doctor or nurse will talk with you about which tests are important for you.   Getting shots for common diseases like the flu and shingles will help prevent them.      Patient Strengths/Coping Skills: Attends scheduled medical appointments Takes medications as prescribed Has support from daughter, Neita Goodnight  Patient Defictis Mobility issues Some difficulties in completing ADLs Some Pain issues  Patient Goals:  Patient to attend scheduled medical appointments in next 30 days Patient to talk regularly with her daughter, Amy, in next 30 days about needs of client Client to call RNCM or LCSW as needed in next 30 days for CCM support  Follow Up Plan: LCSW to call client on 09/17/20       [name].Nevyn Bossman MSW, LCSW Licensed Clinical Social Worker [facility] [phone]

## 2020-08-27 ENCOUNTER — Encounter: Payer: Self-pay | Admitting: Nurse Practitioner

## 2020-08-27 ENCOUNTER — Ambulatory Visit (INDEPENDENT_AMBULATORY_CARE_PROVIDER_SITE_OTHER): Payer: Medicare Other | Admitting: Nurse Practitioner

## 2020-08-27 ENCOUNTER — Other Ambulatory Visit: Payer: Self-pay

## 2020-08-27 VITALS — BP 137/87 | HR 125 | Temp 96.0°F | Resp 20 | Ht 63.0 in | Wt 179.0 lb

## 2020-08-27 DIAGNOSIS — N3000 Acute cystitis without hematuria: Secondary | ICD-10-CM

## 2020-08-27 DIAGNOSIS — R300 Dysuria: Secondary | ICD-10-CM | POA: Diagnosis not present

## 2020-08-27 LAB — URINALYSIS, COMPLETE
Bilirubin, UA: NEGATIVE
Glucose, UA: NEGATIVE
Ketones, UA: NEGATIVE
Nitrite, UA: POSITIVE — AB
Protein,UA: NEGATIVE
RBC, UA: NEGATIVE
Specific Gravity, UA: 1.01 (ref 1.005–1.030)
Urobilinogen, Ur: 0.2 mg/dL (ref 0.2–1.0)
pH, UA: 7 (ref 5.0–7.5)

## 2020-08-27 LAB — MICROSCOPIC EXAMINATION: RBC, Urine: NONE SEEN /hpf (ref 0–2)

## 2020-08-27 MED ORDER — CEPHALEXIN 500 MG PO CAPS: 500.0000 mg | ORAL_CAPSULE | Freq: Two times a day (BID) | ORAL | 0 refills | Status: DC

## 2020-08-27 NOTE — Progress Notes (Signed)
   Subjective:    Patient ID: Gabriela Smith, female    DOB: 1940-04-22, 80 y.o.   MRN: [id number]   Chief Complaint: Urinary Frequency and Back Pain   HPI Patient comes in today c/o dysuria that started 2 days ago. Has frequency and urgency. Said she had to get up every 2 hours to void during the night.    Review of Systems  Genitourinary:  Positive for dysuria, frequency and urgency. Negative for difficulty urinating, dyspareunia, flank pain, hematuria and pelvic pain.  All other systems reviewed and are negative.     Objective:   Physical Exam Vitals and nursing note reviewed.  Constitutional:      Appearance: Normal appearance.  Cardiovascular:     Rate and Rhythm: Normal rate. Rhythm irregular.     Heart sounds: Normal heart sounds.  Pulmonary:     Breath sounds: Normal breath sounds.  Abdominal:     Tenderness: There is no right CVA tenderness or left CVA tenderness.  Skin:    General: Skin is warm.  Neurological:     General: No focal deficit present.     Mental Status: She is alert and oriented to person, place, and time.  Psychiatric:        Mood and Affect: Mood normal.        Behavior: Behavior normal.    BP 137/87   Pulse (!) 125   Temp (!) 96 F (35.6 C) (Temporal)   Resp 20   Ht 5\' 3"  (1.6 m)   Wt 179 lb (81.2 kg)   SpO2 98%   BMI 31.71 kg/m   Urine + nitrites      Assessment & Plan:  Gabriela Smith in today with chief complaint of Urinary Frequency and Back Pain   1. Dysuria - Urinalysis, Complete  2. Acute cystitis without hematuria Take medication as prescribe Cotton underwear Take shower not bath Cranberry juice, yogurt Force fluids AZO over the counter X2 days Culture pending RTO prn  - cephALEXin (KEFLEX) 500 MG capsule; Take 1 capsule (500 mg total) by mouth 2 (two) times daily.  Dispense: 14 capsule; Refill: 0    The above assessment and management plan was discussed with the patient. The patient verbalized understanding  of and has agreed to the management plan. Patient is aware to call the clinic if symptoms persist or worsen. Patient is aware when to return to the clinic for a follow-up visit. Patient educated on when it is appropriate to go to the emergency department.   Mary-Margaret Hassell Done, FNP

## 2020-08-27 NOTE — Patient Instructions (Signed)

## 2020-08-29 ENCOUNTER — Encounter: Payer: Self-pay | Admitting: Family Medicine

## 2020-08-29 ENCOUNTER — Other Ambulatory Visit: Payer: Self-pay

## 2020-08-29 ENCOUNTER — Ambulatory Visit (INDEPENDENT_AMBULATORY_CARE_PROVIDER_SITE_OTHER): Payer: Medicare Other | Admitting: Family Medicine

## 2020-08-29 ENCOUNTER — Telehealth: Payer: Self-pay | Admitting: Family Medicine

## 2020-08-29 VITALS — BP 132/89 | HR 97 | Temp 98.8°F | Ht 63.0 in | Wt 181.6 lb

## 2020-08-29 DIAGNOSIS — N898 Other specified noninflammatory disorders of vagina: Secondary | ICD-10-CM | POA: Diagnosis not present

## 2020-08-29 DIAGNOSIS — N3000 Acute cystitis without hematuria: Secondary | ICD-10-CM | POA: Diagnosis not present

## 2020-08-29 LAB — WET PREP FOR TRICH, YEAST, CLUE
Clue Cell Exam: NEGATIVE
Trichomonas Exam: NEGATIVE
Yeast Exam: NEGATIVE

## 2020-08-29 NOTE — Patient Instructions (Signed)
Please go pick up your prescription for cephalexin (Keflex) and start taking for your urinary tract infection.

## 2020-08-29 NOTE — Progress Notes (Signed)
Assessment & Plan:  1. Acute cystitis without hematuria Education provided on UTIs. Encouraged adequate hydration.  Advised to pick up cephalexin and start taking as prescribed.  2. Vagina itching Wet prep negative.  Advised patient she does not need to be using clindamycin vaginal cream as there is no indication for it. - WET PREP FOR [place], YEAST, CLUE   Follow up plan: Return if symptoms worsen or fail to improve.  Hendricks Limes, MSN, APRN, FNP-C [facility]  Subjective:   Patient ID: Gabriela Smith, female    DOB: June 13, 1940, 80 y.o.   MRN: [id number]  HPI: Gabriela Smith is a 80 y.o. female presenting on 08/29/2020 for Vaginal Itching (Patient was seen 6/20 and states she is still having some itching and dysuria/)  Patient's appointment was made today after she called requesting a medication to be filled.  She is currently having symptoms of itching, dysuria, and low back pain on the right side.  She was seen 2 days ago and diagnosed with a UTI, at which time she was prescribed Keflex 500 mg twice daily.  She has not yet picked up and started the medication.  She has been using clindamycin vaginal cream at bedtime x3 weeks that was prescribed by a provider outside of this office.  She is not sure the diagnosis for which it was prescribed.   ROS: Negative unless specifically indicated above in HPI.   Relevant past medical history reviewed and updated as indicated.   Allergies and medications reviewed and updated.   Current Outpatient Medications:    acetaminophen (TYLENOL) 500 MG tablet, Take 2 tablets (1,000 mg total) by mouth every 8 (eight) hours., Disp: 30 tablet, Rfl: 0   amLODipine (NORVASC) 5 MG tablet, Take 1 tablet (5 mg total) by mouth daily., Disp: 90 tablet, Rfl: 3   apixaban (ELIQUIS) 5 MG TABS tablet, Take 1 tablet (5 mg total) by mouth 2 (two) times daily., Disp: 180 tablet, Rfl: 1   aspirin EC 81 MG EC tablet, Take 1 tablet (81 mg total)  by mouth daily., Disp:  , Rfl:    cephALEXin (KEFLEX) 500 MG capsule, Take 1 capsule (500 mg total) by mouth 2 (two) times daily., Disp: 14 capsule, Rfl: 0   cholecalciferol (VITAMIN D) 1000 UNITS tablet, Take 1,000 Units by mouth daily after breakfast. , Disp: , Rfl:    Co-Enzyme Q10 200 MG CAPS, Take 200 mg by mouth daily after breakfast. , Disp: , Rfl:    digoxin (LANOXIN) 0.125 MG tablet, Take 1 tablet (125 mcg total) by mouth daily., Disp: 90 tablet, Rfl: 1   Flaxseed, Linseed, (FLAX SEED OIL) 1000 MG CAPS, Take 1,000 mg by mouth daily after breakfast. , Disp: , Rfl:    gabapentin (NEURONTIN) 100 MG capsule, Take 2 capsules (200 mg total) by mouth at bedtime., Disp: 180 capsule, Rfl: 0   GNP VITAMIN B-12 500 MCG tablet, TAKE 1 TABLET DAILY, Disp: 90 tablet, Rfl: 0   levothyroxine (SYNTHROID) 50 MCG tablet, Take 1 tablet (50 mcg total) by mouth daily., Disp: 90 tablet, Rfl: 3   Magnesium 300 MG CAPS, Take 300 mg by mouth at bedtime. , Disp: , Rfl:    metoprolol tartrate (LOPRESSOR) 100 MG tablet, Take 0.5 tablets (50 mg total) by mouth 2 (two) times daily., Disp: 90 tablet, Rfl: 3   nitroGLYCERIN (NITROSTAT) 0.4 MG SL tablet, PLACE 1 TABLET UNDER THE TONGUE AT ONSET OF CHEST PAIN EVERY 5 MINTUES UP TO  3 TIMES AS NEEDED, Disp: 25 tablet, Rfl: 0   REPATHA SURECLICK 956 MG/ML SOAJ, INJECT 140MG  (1ML) EVERY 2 WEEKS, Disp: 2 mL, Rfl: 11   Rimegepant Sulfate (NURTEC) 75 MG TBDP, Dissolve 1 tablet in mouth ONCE per day if needed for headache, Disp: 2 tablet, Rfl: 0   TURMERIC PO, Take 1 capsule by mouth daily after breakfast., Disp: , Rfl:  No current facility-administered medications for this visit.  Facility-Administered Medications Ordered in Other Visits:    DOBUTamine (DOBUTREX) 1,000 mcg/mL in dextrose 5% 250 mL infusion, 20 mcg/kg/min, Intravenous, Continuous, Dorothy Spark, MD, Last Rate: 96.4 mL/hr at 12/24/15 0840, 20 mcg/kg/min at 12/24/15 0840  Allergies  Allergen Reactions   Fish  Allergy Hives, Shortness Of Breath and Other (See Comments)    "Bottom-feeder fish"   Fish-Derived Products Hives, Shortness Of Breath and Other (See Comments)    Bottom-feeder fish   Shellfish-Derived Products Hives and Shortness Of Breath   Lasix [Furosemide] Swelling and Other (See Comments)    Edema, elevated BP   Macrodantin [Nitrofurantoin Macrocrystal] Other (See Comments)    Unknown reaction    Vicodin [Hydrocodone-Acetaminophen] Nausea And Vomiting and Other (See Comments)    SEVERE N/V   Zetia [Ezetimibe] Other (See Comments)    Muscle aches   Codeine Rash   Latex Itching and Rash    Reports Elastic in underwear, under wire bras, and now surgical cap cause rash and itching.    Niacin Other (See Comments)    Muscle aches   Niaspan [Niacin Er] Other (See Comments)    Muscle aches   Statins Other (See Comments)    Muscle aches/ cramps   Sulfa Antibiotics Rash and Other (See Comments)    Muscle aches/ cramps, also    Objective:   There were no vitals taken for this visit.   Physical Exam Vitals reviewed.  Constitutional:      General: She is not in acute distress.    Appearance: Normal appearance. She is not ill-appearing, toxic-appearing or diaphoretic.  HENT:     Head: Normocephalic and atraumatic.  Eyes:     General: No scleral icterus.       Right eye: No discharge.        Left eye: No discharge.     Conjunctiva/sclera: Conjunctivae normal.  Cardiovascular:     Rate and Rhythm: Normal rate.  Pulmonary:     Effort: Pulmonary effort is normal. No respiratory distress.  Musculoskeletal:        General: Normal range of motion.     Cervical back: Normal range of motion.  Skin:    General: Skin is warm and dry.     Capillary Refill: Capillary refill takes less than 2 seconds.  Neurological:     General: No focal deficit present.     Mental Status: She is alert and oriented to person, place, and time. Mental status is at baseline.  Psychiatric:        Mood  and Affect: Mood normal.        Behavior: Behavior normal.        Thought Content: Thought content normal.        Judgment: Judgment normal.

## 2020-08-29 NOTE — Telephone Encounter (Signed)
Pt was talking about medication for a yeast infection. Informed her that we do not refill medication for this without a visit. Appt made for today

## 2020-08-30 LAB — URINE CULTURE

## 2020-09-07 ENCOUNTER — Other Ambulatory Visit: Payer: Self-pay | Admitting: Family Medicine

## 2020-09-07 NOTE — Telephone Encounter (Signed)
Pharmacy called pt need the Betamethasone Val cream ,1 percent - can you order 45 gram tube (that is in stock)

## 2020-09-09 NOTE — Telephone Encounter (Signed)
This is not a medication on current list. What is she needing this for?

## 2020-09-11 MED ORDER — BETAMETHASONE VALERATE 0.1 % EX CREA: 1.0000 "application " | TOPICAL_CREAM | Freq: Every day | CUTANEOUS | 1 refills | Status: DC

## 2020-09-11 NOTE — Telephone Encounter (Signed)
Cream is for vaginal irritation - was in med HX from 03/2019. I have pended.

## 2020-09-17 ENCOUNTER — Telehealth: Payer: Medicare Other

## 2020-09-27 ENCOUNTER — Other Ambulatory Visit: Payer: Self-pay | Admitting: Cardiology

## 2020-09-27 ENCOUNTER — Telehealth: Payer: Self-pay | Admitting: Family Medicine

## 2020-09-27 DIAGNOSIS — L281 Prurigo nodularis: Secondary | ICD-10-CM | POA: Diagnosis not present

## 2020-09-27 DIAGNOSIS — I872 Venous insufficiency (chronic) (peripheral): Secondary | ICD-10-CM | POA: Diagnosis not present

## 2020-09-27 DIAGNOSIS — L570 Actinic keratosis: Secondary | ICD-10-CM | POA: Diagnosis not present

## 2020-09-27 DIAGNOSIS — X32XXXD Exposure to sunlight, subsequent encounter: Secondary | ICD-10-CM | POA: Diagnosis not present

## 2020-09-27 MED ORDER — CLINDAMYCIN PHOSPHATE 2 % VA CREA
1.0000 | TOPICAL_CREAM | Freq: Every day | VAGINAL | 2 refills | Status: DC
Start: 2020-09-27 — End: 2021-09-04

## 2020-09-27 NOTE — Telephone Encounter (Signed)
Pt aware - refill sent ( keeps on hand for flare ups)

## 2020-09-27 NOTE — Telephone Encounter (Signed)
  Prescription Request  09/27/2020  What is the name of the medication or equipment? clindamycin  Have you contacted your pharmacy to request a refill? (if applicable) no  Which pharmacy would you like this sent to? [facility]   Patient notified that their request is being sent to the clinical staff for review and that they should receive a response within 2 business days.

## 2020-10-18 DIAGNOSIS — L820 Inflamed seborrheic keratosis: Secondary | ICD-10-CM | POA: Diagnosis not present

## 2020-10-18 DIAGNOSIS — L570 Actinic keratosis: Secondary | ICD-10-CM | POA: Diagnosis not present

## 2020-10-18 DIAGNOSIS — L980 Pyogenic granuloma: Secondary | ICD-10-CM | POA: Diagnosis not present

## 2020-10-18 DIAGNOSIS — X32XXXD Exposure to sunlight, subsequent encounter: Secondary | ICD-10-CM | POA: Diagnosis not present

## 2020-10-26 ENCOUNTER — Ambulatory Visit (INDEPENDENT_AMBULATORY_CARE_PROVIDER_SITE_OTHER): Payer: Medicare Other | Admitting: Licensed Clinical Social Worker

## 2020-10-26 ENCOUNTER — Other Ambulatory Visit: Payer: Self-pay | Admitting: Cardiology

## 2020-10-26 DIAGNOSIS — I714 Abdominal aortic aneurysm, without rupture, unspecified: Secondary | ICD-10-CM

## 2020-10-26 DIAGNOSIS — I5031 Acute diastolic (congestive) heart failure: Secondary | ICD-10-CM | POA: Diagnosis not present

## 2020-10-26 DIAGNOSIS — E785 Hyperlipidemia, unspecified: Secondary | ICD-10-CM | POA: Diagnosis not present

## 2020-10-26 DIAGNOSIS — I739 Peripheral vascular disease, unspecified: Secondary | ICD-10-CM

## 2020-10-26 DIAGNOSIS — R7989 Other specified abnormal findings of blood chemistry: Secondary | ICD-10-CM

## 2020-10-26 DIAGNOSIS — Z951 Presence of aortocoronary bypass graft: Secondary | ICD-10-CM

## 2020-10-26 DIAGNOSIS — I10 Essential (primary) hypertension: Secondary | ICD-10-CM

## 2020-10-26 DIAGNOSIS — I4819 Other persistent atrial fibrillation: Secondary | ICD-10-CM | POA: Diagnosis not present

## 2020-10-26 DIAGNOSIS — M5136 Other intervertebral disc degeneration, lumbar region: Secondary | ICD-10-CM

## 2020-10-26 NOTE — Chronic Care Management (AMB) (Signed)
Chronic Care Management    Clinical Social Work Note  10/26/2020 Name: Gabriela Smith MRN: [id number] DOB: May 19, 1940  Gabriela Smith is a 80 y.o. year old female who is a primary care patient of [name], DO. The CCM team was consulted to assist the patient with chronic disease management and/or care coordination needs related to: [employer] .   Engaged with patient by telephone for follow up visit in response to provider referral for social work chronic care management and care coordination services.   Consent to Services:  The patient was given information about Chronic Care Management services, agreed to services, and gave verbal consent prior to initiation of services.  Please see initial visit note for detailed documentation.   Patient agreed to services and consent obtained.   Assessment: Review of patient past medical history, allergies, medications, and health status, including review of relevant consultants reports was performed today as part of a comprehensive evaluation and provision of chronic care management and care coordination services.     SDOH (Social Determinants of Health) assessments and interventions performed: client has some stress related to managing medical needs of her spouse SDOH Interventions    Flowsheet Row Most Recent Value  SDOH Interventions   Physical Activity Interventions Other (Comments)  [client is active and tries to walk occasionally for exercise]        Advanced Directives Status: See Vynca application for related entries.  CCM Care Plan  Allergies  Allergen Reactions   Fish Allergy Hives, Shortness Of Breath and Other (See Comments)    "Bottom-feeder fish"   Fish-Derived Products Hives, Shortness Of Breath and Other (See Comments)    Bottom-feeder fish   Shellfish-Derived Products Hives and Shortness Of Breath   Lasix [Furosemide] Swelling and Other (See Comments)    Edema, elevated BP   Macrodantin  [Nitrofurantoin Macrocrystal] Other (See Comments)    Unknown reaction    Vicodin [Hydrocodone-Acetaminophen] Nausea And Vomiting and Other (See Comments)    SEVERE N/V   Zetia [Ezetimibe] Other (See Comments)    Muscle aches   Codeine Rash   Latex Itching and Rash    Reports Elastic in underwear, under wire bras, and now surgical cap cause rash and itching.    Niacin Other (See Comments)    Muscle aches   Niaspan [Niacin Er] Other (See Comments)    Muscle aches   Statins Other (See Comments)    Muscle aches/ cramps   Sulfa Antibiotics Rash and Other (See Comments)    Muscle aches/ cramps, also    Outpatient Encounter Medications as of 10/26/2020  Medication Sig   acetaminophen (TYLENOL) 500 MG tablet Take 2 tablets (1,000 mg total) by mouth every 8 (eight) hours.   amLODipine (NORVASC) 5 MG tablet Take 1 tablet (5 mg total) by mouth daily.   apixaban (ELIQUIS) 5 MG TABS tablet Take 1 tablet (5 mg total) by mouth 2 (two) times daily.   aspirin EC 81 MG EC tablet Take 1 tablet (81 mg total) by mouth daily.   betamethasone valerate (VALISONE) 0.1 % cream Apply 1 application topically at bedtime. Apply externally to vaginal area   cephALEXin (KEFLEX) 500 MG capsule Take 1 capsule (500 mg total) by mouth 2 (two) times daily.   cholecalciferol (VITAMIN D) 1000 UNITS tablet Take 1,000 Units by mouth daily after breakfast.    clindamycin (CLEOCIN) 2 % vaginal cream Place 1 Applicatorful vaginally at bedtime.   Co-Enzyme Q10 200 MG CAPS Take 200  mg by mouth daily after breakfast.    digoxin (LANOXIN) 0.125 MG tablet TAKE 1 TABLET DAILY   Flaxseed, Linseed, (FLAX SEED OIL) 1000 MG CAPS Take 1,000 mg by mouth daily after breakfast.    gabapentin (NEURONTIN) 100 MG capsule Take 2 capsules (200 mg total) by mouth at bedtime.   GNP VITAMIN B-12 500 MCG tablet TAKE 1 TABLET DAILY   levothyroxine (SYNTHROID) 50 MCG tablet Take 1 tablet (50 mcg total) by mouth daily.   Magnesium 300 MG CAPS Take  300 mg by mouth at bedtime.    metoprolol tartrate (LOPRESSOR) 100 MG tablet Take 0.5 tablets (50 mg total) by mouth 2 (two) times daily.   nitroGLYCERIN (NITROSTAT) 0.4 MG SL tablet PLACE 1 TABLET UNDER THE TONGUE AT ONSET OF CHEST PAIN EVERY 5 MINTUES UP TO 3 TIMES AS NEEDED   REPATHA SURECLICK XX123456 MG/ML SOAJ INJECT '140MG'$  (1ML) EVERY 2 WEEKS   Rimegepant Sulfate (NURTEC) 75 MG TBDP Dissolve 1 tablet in mouth ONCE per day if needed for headache   TURMERIC PO Take 1 capsule by mouth daily after breakfast.   Facility-Administered Encounter Medications as of 10/26/2020  Medication   DOBUTamine (DOBUTREX) 1,000 mcg/mL in dextrose 5% 250 mL infusion    Patient Active Problem List   Diagnosis Date Noted   Persistent atrial fibrillation ([place]) 07/03/2020   Edema 07/03/2020   Paroxysmal A-fib ([place]) 08/12/2019   S/P CABG x 4 04/26/2019   Acute CHF (congestive heart failure) ([place]) 04/15/2019   Disease related peripheral neuropathy 03/07/2019   BMI 33.0-33.9,adult 02/17/2019   Intermittent claudication ([place]) 02/17/2019   Peripheral vascular insufficiency ([facility]) 02/17/2019   Aortic atherosclerosis ([place]) 02/11/2017   Atrophic vaginitis 09/18/2016   S/P right THA, AA 12/25/2015   S/P hip replacement 12/25/2015   AAA (abdominal aortic aneurysm) without rupture ([place]) 08/24/2015   Statin intolerance 05/23/2013   Osteopenia of right forearm 04/20/2013   Degenerative disc disease, lumbar 02/08/2013   Vitamin D deficiency 02/08/2013   Overweight (BMI 25.0-29.9) 11/24/2012   S/P left TKA 11/22/2012   Malignant neoplasm of urinary bladder ([place]) 11/04/2012   Osteoarthritis of left knee 11/04/2012   Hyperlipidemia LDL goal <70 11/28/2008   Hypertension 11/28/2008   CORONARY ATHEROSCLEROSIS NATIVE CORONARY ARTERY 11/28/2008    Conditions to be addressed/monitored: monitor client management of medical conditions of client   Care Plan : LCSW Care plan  Updates made by Katha Cabal, LCSW since  10/26/2020 12:00 AM     Problem: Coping Skills (General Plan of Care)      Goal: Coping Skills Enhanced;Manage Medical Needs   Start Date: 10/26/2020  Expected End Date: 01/24/2021  This Visit's Progress: On track  Recent Progress: On track  Priority: Medium  Note:   Current barriers:   Patient in need of assistance with connecting to community resources for possible help in managing medical needs Patient is unable to independently navigate community resource options without care coordination support Mobility issues Some pain issues  Clinical Goals:  patient will work with SW in next 30 days to address concerns related to client management of ongoing medical needs Client to call RNCM or LCSW as needed in next 30 days for CCM support   Clinical Interventions:  Collaboration with [name], DO regarding development and update of comprehensive plan of care as evidenced by provider attestation and co-signature Talked with client about needs of client Talked with client about appetite of client Talked with client about sleeping issues of  client Talked with client about mobility of client Talked with client about pain issues of client (she spoke of constant pain in her feet) Talked with client about upcoming client medical appointments Talked with client about expense of Eliquis procurement Talked with client about vision of client Talked with client about mood of client. She said she is in a positive mood. She said she did not have any mood issues at present.  She is concerned about managing ongoing health needs of her spouse, Laverna Peace. Her spouse is diagnosed with Parkinson's disease Talked with client about apartment where client and her spouse reside. She said they have garage apartment next to home of her daughter. She said apartment is nice and has 13 steps to reach apartment. She said they have a stair lift seat that they use to sit in to go up and down these 13  steps. Encouraged client to call RNCM as needed for nursing support Talked with client about socialization of client Collaborated with RNCM Chong Sicilian today about foot pain issues of client Collaborated with Dr. Lottie Dawson, [facility] Pharmacist about foot pain issues of client  Patient Strengths/Coping Skills: Attends scheduled medical appointments Takes medications as prescribed Has support from daughter, Neita Goodnight  Patient Defictis Mobility issues Some difficulties in completing ADLs Some Pain issues  Patient Goals:  Patient to attend scheduled medical appointments in next 30 days Patient to talk regularly with her daughter, Amy, in next 30 days about needs of client Client to call RNCM or LCSW as needed in next 30 days for CCM support  Follow Up Plan: LCSW to call client on 12/06/20 to assess needs of client      [name].Claris Pech MSW, LCSW Licensed Clinical Social Worker [facility] Care Management [phone]

## 2020-10-26 NOTE — Patient Instructions (Signed)
Visit Information  PATIENT GOALS:  Goals Addressed             This Visit's Progress    Manage My Emotions       Protect My Health;Manage Medical Needs       Timeframe:  Short-Term Goal Priority:  Medium Progress: On Track Start Date:             10/26/20              Expected End Date:           01/24/21            Follow Up Date 12/06/20   Protect My Health (Patient)  Manage Medical needs    Why is this important?   Screening tests can find diseases early when they are easier to treat.  Your doctor or nurse will talk with you about which tests are important for you.  Getting shots for common diseases like the flu and shingles will help prevent them.      Patient Strengths/Coping Skills: Attends scheduled medical appointments Takes medications as prescribed Has support from daughter, Neita Goodnight  Patient Defictis Mobility issues Some difficulties in completing ADLs Some Pain issues  Patient Goals:  Patient to attend scheduled medical appointments in next 30 days Patient to talk regularly with her daughter, Amy, in next 30 days about needs of client Client to call RNCM or LCSW as needed in next 30 days for CCM support  Follow Up Plan: LCSW to call client on 12/06/20 to assess client needs     [name].Sylver Vantassell MSW, LCSW Licensed Clinical Social Worker [facility] Care Management [phone]

## 2020-11-28 ENCOUNTER — Other Ambulatory Visit: Payer: Self-pay | Admitting: Cardiology

## 2020-11-28 DIAGNOSIS — I480 Paroxysmal atrial fibrillation: Secondary | ICD-10-CM

## 2020-11-28 NOTE — Telephone Encounter (Signed)
Routing to pharmd pool to order labs  Prescription refill request for Eliquis received. Last office visit:HOCHREIN 07/04/20 Scr:0.810 mg/ 08/12/2019 overdue Age: 4F Weight:82.4

## 2020-11-28 NOTE — Telephone Encounter (Signed)
Please order and advise pt of need for BMET and CBC for annual DOAC monitoring

## 2020-11-28 NOTE — Telephone Encounter (Signed)
CALLED AND LMOMED PT THAT LABS NEEDED FOR FURTHER REFILLS

## 2020-11-29 ENCOUNTER — Other Ambulatory Visit: Payer: Medicare Other

## 2020-11-29 ENCOUNTER — Other Ambulatory Visit: Payer: Self-pay

## 2020-11-29 DIAGNOSIS — E785 Hyperlipidemia, unspecified: Secondary | ICD-10-CM | POA: Diagnosis not present

## 2020-11-29 DIAGNOSIS — I2510 Atherosclerotic heart disease of native coronary artery without angina pectoris: Secondary | ICD-10-CM

## 2020-11-29 DIAGNOSIS — I4819 Other persistent atrial fibrillation: Secondary | ICD-10-CM | POA: Diagnosis not present

## 2020-11-29 DIAGNOSIS — I10 Essential (primary) hypertension: Secondary | ICD-10-CM

## 2020-11-29 DIAGNOSIS — Z79899 Other long term (current) drug therapy: Secondary | ICD-10-CM

## 2020-11-29 LAB — CBC
Hematocrit: 40.5 % (ref 34.0–46.6)
Hemoglobin: 13.6 g/dL (ref 11.1–15.9)
MCH: 32.1 pg (ref 26.6–33.0)
MCHC: 33.6 g/dL (ref 31.5–35.7)
MCV: 96 fL (ref 79–97)
Platelets: 246 10*3/uL (ref 150–450)
RBC: 4.24 x10E6/uL (ref 3.77–5.28)
RDW: 12.6 % (ref 11.7–15.4)
WBC: 7.5 10*3/uL (ref 3.4–10.8)

## 2020-11-29 LAB — BASIC METABOLIC PANEL
BUN/Creatinine Ratio: 15 (ref 12–28)
BUN: 13 mg/dL (ref 8–27)
CO2: 22 mmol/L (ref 20–29)
Calcium: 9.4 mg/dL (ref 8.7–10.3)
Chloride: 103 mmol/L (ref 96–106)
Creatinine, Ser: 0.84 mg/dL (ref 0.57–1.00)
Glucose: 114 mg/dL — ABNORMAL HIGH (ref 65–99)
Potassium: 4.8 mmol/L (ref 3.5–5.2)
Sodium: 142 mmol/L (ref 134–144)
eGFR: 69 mL/min/{1.73_m2} (ref >59)

## 2020-11-30 LAB — LIPID PANEL
Chol/HDL Ratio: 2.7 ratio (ref 0.0–4.4)
Cholesterol, Total: 107 mg/dL (ref 100–199)
HDL: 40 mg/dL (ref >39)
LDL Chol Calc (NIH): 37 mg/dL (ref 0–99)
Triglycerides: 181 mg/dL — ABNORMAL HIGH (ref 0–149)
VLDL Cholesterol Cal: 30 mg/dL (ref 5–40)

## 2020-12-05 ENCOUNTER — Ambulatory Visit (INDEPENDENT_AMBULATORY_CARE_PROVIDER_SITE_OTHER): Payer: Medicare Other

## 2020-12-05 VITALS — BP 131/72 | HR 76 | Ht 63.0 in | Wt 175.0 lb

## 2020-12-05 DIAGNOSIS — Z0000 Encounter for general adult medical examination without abnormal findings: Secondary | ICD-10-CM | POA: Diagnosis not present

## 2020-12-05 NOTE — Patient Instructions (Signed)
Gabriela Smith , Thank you for taking time to come for your Medicare Wellness Visit. I appreciate your ongoing commitment to your health goals. Please review the following plan we discussed and let me know if I can assist you in the future.   Screening recommendations/referrals: Colonoscopy: No longer required Mammogram: Done 05/20/2017 - still recommended every 1-2 years, but not required Bone Density: Done 01/27/2020 - Repeat every 2 years Recommended yearly ophthalmology/optometry visit for glaucoma screening and checkup Recommended yearly dental visit for hygiene and checkup  Vaccinations: Influenza vaccine: Done 12/09/2019 - Repeat annually Pneumococcal vaccine: Done 11/04/2012 & 06/28/2014 Tdap vaccine: Done 10/09/2010 - Repeat in 10 years *due Shingles vaccine: Zostavax done 2008 - Shingrix discussed. Please contact your pharmacy for coverage information.     Covid-19: Done 03/30/2019, 06/25/2019, 11/29/2019, & 08/02/2020  Advanced directives: in chart from 2014 - update about every 10 years or so  Conditions/risks identified: Aim for 30 minutes of exercise or brisk walking each day, drink 6-8 glasses of water and eat lots of fruits and vegetables.  Next appointment: Follow up in one year for your annual wellness visit    Preventive Care 82 Years and Older, Female Preventive care refers to lifestyle choices and visits with your health care provider that can promote health and wellness. What does preventive care include? A yearly physical exam. This is also called an annual well check. Dental exams once or twice a year. Routine eye exams. Ask your health care provider how often you should have your eyes checked. Personal lifestyle choices, including: Smith care of your teeth and gums. Regular physical activity. Eating a healthy diet. Avoiding tobacco and drug use. Limiting alcohol use. Practicing safe sex. Taking low-dose aspirin every day. Taking vitamin and mineral supplements as  recommended by your health care provider. What happens during an annual well check? The services and screenings done by your health care provider during your annual well check will depend on your age, overall health, lifestyle risk factors, and family history of disease. Counseling  Your health care provider may ask you questions about your: Alcohol use. Tobacco use. Drug use. Emotional well-being. Home and relationship well-being. Sexual activity. Eating habits. History of falls. Memory and ability to understand (cognition). Work and work Statistician. Reproductive health. Screening  You may have the following tests or measurements: Height, weight, and BMI. Blood pressure. Lipid and cholesterol levels. These may be checked every 5 years, or more frequently if you are over 35 years old. Skin check. Lung cancer screening. You may have this screening every year starting at age 82 if you have a 30-pack-year history of smoking and currently smoke or have quit within the past 15 years. Fecal occult blood test (FOBT) of the stool. You may have this test every year starting at age 82. Flexible sigmoidoscopy or colonoscopy. You may have a sigmoidoscopy every 5 years or a colonoscopy every 10 years starting at age 82. Hepatitis C blood test. Hepatitis B blood test. Sexually transmitted disease (STD) testing. Diabetes screening. This is done by checking your blood sugar (glucose) after you have not eaten for a while (fasting). You may have this done every 1-3 years. Bone density scan. This is done to screen for osteoporosis. You may have this done starting at age 82. Mammogram. This may be done every 1-2 years. Talk to your health care provider about how often you should have regular mammograms. Talk with your health care provider about your test results, treatment options, and if necessary,  the need for more tests. Vaccines  Your health care provider may recommend certain vaccines, such  as: Influenza vaccine. This is recommended every year. Tetanus, diphtheria, and acellular pertussis (Tdap, Td) vaccine. You may need a Td booster every 10 years. Zoster vaccine. You may need this after age 82. Pneumococcal 13-valent conjugate (PCV13) vaccine. One dose is recommended after age 82. Pneumococcal polysaccharide (PPSV23) vaccine. One dose is recommended after age 82. Talk to your health care provider about which screenings and vaccines you need and how often you need them. This information is not intended to replace advice given to you by your health care provider. Make sure you discuss any questions you have with your health care provider. Document Released: 03/23/2015 Document Revised: 11/14/2015 Document Reviewed: 12/26/2014 Elsevier Interactive Patient Education  2017 [place] Prevention in the Home Falls can cause injuries. They can happen to people of all ages. There are many things you can do to make your home safe and to help prevent falls. What can I do on the outside of my home? Regularly fix the edges of walkways and driveways and fix any cracks. Remove anything that might make you trip as you walk through a door, such as a raised step or threshold. Trim any bushes or trees on the path to your home. Use bright outdoor lighting. Clear any walking paths of anything that might make someone trip, such as rocks or tools. Regularly check to see if handrails are loose or broken. Make sure that both sides of any steps have handrails. Any raised decks and porches should have guardrails on the edges. Have any leaves, snow, or ice cleared regularly. Use sand or salt on walking paths during winter. Clean up any spills in your garage right away. This includes oil or grease spills. What can I do in the bathroom? Use night lights. Install grab bars by the toilet and in the tub and shower. Do not use towel bars as grab bars. Use non-skid mats or decals in the tub or  shower. If you need to sit down in the shower, use a plastic, non-slip stool. Keep the floor dry. Clean up any water that spills on the floor as soon as it happens. Remove soap buildup in the tub or shower regularly. Attach bath mats securely with double-sided non-slip rug tape. Do not have throw rugs and other things on the floor that can make you trip. What can I do in the bedroom? Use night lights. Make sure that you have a light by your bed that is easy to reach. Do not use any sheets or blankets that are too big for your bed. They should not hang down onto the floor. Have a firm chair that has side arms. You can use this for support while you get dressed. Do not have throw rugs and other things on the floor that can make you trip. What can I do in the kitchen? Clean up any spills right away. Avoid walking on wet floors. Keep items that you use a lot in easy-to-reach places. If you need to reach something above you, use a strong step stool that has a grab bar. Keep electrical cords out of the way. Do not use floor polish or wax that makes floors slippery. If you must use wax, use non-skid floor wax. Do not have throw rugs and other things on the floor that can make you trip. What can I do with my stairs? Do not leave any items on  the stairs. Make sure that there are handrails on both sides of the stairs and use them. Fix handrails that are broken or loose. Make sure that handrails are as long as the stairways. Check any carpeting to make sure that it is firmly attached to the stairs. Fix any carpet that is loose or worn. Avoid having throw rugs at the top or bottom of the stairs. If you do have throw rugs, attach them to the floor with carpet tape. Make sure that you have a light switch at the top of the stairs and the bottom of the stairs. If you do not have them, ask someone to add them for you. What else can I do to help prevent falls? Wear shoes that: Do not have high heels. Have  rubber bottoms. Are comfortable and fit you well. Are closed at the toe. Do not wear sandals. If you use a stepladder: Make sure that it is fully opened. Do not climb a closed stepladder. Make sure that both sides of the stepladder are locked into place. Ask someone to hold it for you, if possible. Clearly mark and make sure that you can see: Any grab bars or handrails. First and last steps. Where the edge of each step is. Use tools that help you move around (mobility aids) if they are needed. These include: Canes. Walkers. Scooters. Crutches. Turn on the lights when you go into a dark area. Replace any light bulbs as soon as they burn out. Set up your furniture so you have a clear path. Avoid moving your furniture around. If any of your floors are uneven, fix them. If there are any pets around you, be aware of where they are. Review your medicines with your doctor. Some medicines can make you feel dizzy. This can increase your chance of falling. Ask your doctor what other things that you can do to help prevent falls. This information is not intended to replace advice given to you by your health care provider. Make sure you discuss any questions you have with your health care provider. Document Released: 12/21/2008 Document Revised: 08/02/2015 Document Reviewed: 03/31/2014 Elsevier Interactive Patient Education  2017 [employer].

## 2020-12-05 NOTE — Progress Notes (Signed)
Subjective:   Gabriela Smith is a 80 y.o. female who presents for Medicare Annual (Subsequent) preventive examination.  Virtual Visit via Telephone Note  I connected with  [name] on 12/05/20 at  9:00 AM EDT by telephone and verified that I am speaking with the correct person using two identifiers.  Location: Patient: Home Provider: [facility] Persons participating in the virtual visit: patient/Nurse Health Advisor   I discussed the limitations, risks, security and privacy concerns of performing an evaluation and management service by telephone and the availability of in person appointments. The patient expressed understanding and agreed to proceed.  Interactive audio and video telecommunications were attempted between this nurse and patient, however failed, due to patient having technical difficulties OR patient did not have access to video capability.  We continued and completed visit with audio only.  Some vital signs may be absent or patient reported.   Taiz Bickle E Aydin Cavalieri, LPN   Review of Systems     Cardiac Risk Factors include: advanced age (>20men, >34 women);obesity (BMI >30kg/m2);sedentary lifestyle;Other (see comment);hypertension;dyslipidemia, Risk factor comments: CAD, A.Fib, atherosclerosis, hx heart failure     Objective:    Today's Vitals   12/05/20 0908  BP: 131/72  Pulse: 76  Weight: 175 lb (79.4 kg)  Height: 5\' 3"  (1.6 m)   Body mass index is 31 kg/m.  Advanced Directives 12/05/2020 02/07/2020 12/05/2019 04/17/2019 04/16/2019 04/15/2019 12/02/2018  Does Patient Have a Medical Advance Directive? Yes Yes Yes - Yes Yes Yes  Type of Advance Directive [place];Living will - [place];Living will Living will - - [place];Living will  Does patient want to make changes to medical advance directive? - - No - Patient declined No - Patient declined No - Patient declined - No - Patient declined  Copy of [place] in Chart? Yes - validated most recent copy scanned in chart (See row information) - No - copy requested - - - No - copy requested  Pre-existing out of facility DNR order (yellow form or pink MOST form) - - - - - - -    Current Medications (verified) Outpatient Encounter Medications as of 12/05/2020  Medication Sig   acetaminophen (TYLENOL) 500 MG tablet Take 2 tablets (1,000 mg total) by mouth every 8 (eight) hours.   amLODipine (NORVASC) 5 MG tablet TAKE 1 TABLET DAILY   apixaban (ELIQUIS) 5 MG TABS tablet Take 1 tablet (5 mg total) by mouth 2 (two) times daily. LABS NEEDED FOR FUTURE REFILLS.   aspirin EC 81 MG EC tablet Take 1 tablet (81 mg total) by mouth daily.   betamethasone valerate (VALISONE) 0.1 % cream Apply 1 application topically at bedtime. Apply externally to vaginal area   cephALEXin (KEFLEX) 500 MG capsule Take 1 capsule (500 mg total) by mouth 2 (two) times daily.   cholecalciferol (VITAMIN D) 1000 UNITS tablet Take 1,000 Units by mouth daily after breakfast.    clindamycin (CLEOCIN) 2 % vaginal cream Place 1 Applicatorful vaginally at bedtime.   Co-Enzyme Q10 200 MG CAPS Take 200 mg by mouth daily after breakfast.    digoxin (LANOXIN) 0.125 MG tablet TAKE 1 TABLET DAILY   Flaxseed, Linseed, (FLAX SEED OIL) 1000 MG CAPS Take 1,000 mg by mouth daily after breakfast.    GNP VITAMIN B-12 500 MCG tablet TAKE 1 TABLET DAILY   levothyroxine (SYNTHROID) 50 MCG tablet Take 1 tablet (50 mcg total) by mouth daily.   Magnesium  300 MG CAPS Take 300 mg by mouth at bedtime.    metoprolol tartrate (LOPRESSOR) 100 MG tablet Take 0.5 tablets (50 mg total) by mouth 2 (two) times daily.   nitroGLYCERIN (NITROSTAT) 0.4 MG SL tablet PLACE 1 TABLET UNDER THE TONGUE AT ONSET OF CHEST PAIN EVERY 5 MINTUES UP TO 3 TIMES AS NEEDED   REPATHA SURECLICK 480 MG/ML SOAJ INJECT 140MG  (1ML) EVERY 2 WEEKS   Rimegepant Sulfate (NURTEC) 75 MG TBDP Dissolve 1 tablet in mouth ONCE per day if needed  for headache   TURMERIC PO Take 1 capsule by mouth daily after breakfast.   gabapentin (NEURONTIN) 100 MG capsule Take 2 capsules (200 mg total) by mouth at bedtime. (Patient not taking: Reported on 12/05/2020)   Facility-Administered Encounter Medications as of 12/05/2020  Medication   DOBUTamine (DOBUTREX) 1,000 mcg/mL in dextrose 5% 250 mL infusion    Allergies (verified) Fish allergy, Fish-derived products, Shellfish-derived products, Lasix [furosemide], Macrodantin [nitrofurantoin macrocrystal], Vicodin [hydrocodone-acetaminophen], Zetia [ezetimibe], Codeine, Latex, Niacin, Niaspan [niacin er], Statins, and Sulfa antibiotics   History: Past Medical History:  Diagnosis Date   AAA (abdominal aortic aneurysm) [facility])    cardiology aware    Arthritis    OA lt knee- cortizone inj. q 4 months AND OA ALSO IN BACK. Had knee replaced-issue resolved   CAD (coronary artery disease) CARDIOLOGIST- DR [facility]--  VISIT 01-02-10 IN EPIC   1993-- PTCA. Pt describes a near total blockage of apparently the LAD and a 65% stenosis elsewhere.   Cancer [facility])    History of bladder cancer followed by dr Karsten Ro   hx  TCC of bladder ,  Ta G3-  first occurence 02-20-2012--  s/p BCG tx's  and  mitomycin C   Hyperlipidemia 1993   Hypertension 1993   Nocturia    Osteopenia    Sigmoid diverticulosis    Past Surgical History:  Procedure Laterality Date   CHOLECYSTECTOMY  2003 (approx)   CLIPPING OF ATRIAL APPENDAGE N/A 04/25/2019   Procedure: Clipping Of Atrial Appendage;  Surgeon: Lajuana Matte, MD;  Location: [place];  Service: Open Heart Surgery;  Laterality: N/A;   COLONOSCOPY  05-31-2003   CORONARY ANGIOPLASTY  1993   to LAD   CORONARY ARTERY BYPASS GRAFT N/A 04/25/2019   Procedure: CORONARY ARTERY BYPASS GRAFTING (CABG) times four using left internal mammary artery and left greater saphenous vein harvested endoscipically.;  Surgeon: Lajuana Matte, MD;  Location: [place];  Service: Open Heart  Surgery;  Laterality: N/A;   CYSTOSCOPY WITH BIOPSY  05/09/2011   Procedure: CYSTOSCOPY WITH BIOPSY;  Surgeon: Claybon Jabs, MD;  Location: [facility];  Service: Urology;  Laterality: N/A;  WITH BLADDER BIOPSY GYRUS   LEFT HEART CATH AND CORONARY ANGIOGRAPHY N/A 04/19/2019   Procedure: LEFT HEART CATH AND CORONARY ANGIOGRAPHY;  Surgeon: Leonie Man, MD;  Location: [place] [facility];  Service: Cardiovascular;  Laterality: N/A;   TOTAL HIP ARTHROPLASTY Right 12/25/2015   Procedure: RIGHT TOTAL HIP ARTHROPLASTY ANTERIOR APPROACH;  Surgeon: Paralee Cancel, MD;  Location: [facility];  Service: Orthopedics;  Laterality: Right;   TOTAL KNEE ARTHROPLASTY Left 11/22/2012   Procedure: LEFT TOTAL KNEE ARTHROPLASTY;  Surgeon: Mauri Pole, MD;  Location: [facility];  Service: Orthopedics;  Laterality: Left;   TRANSURETHRAL RESECTION OF BLADDER TUMOR  03/14/2011   Procedure: TRANSURETHRAL RESECTION OF BLADDER TUMOR (TURBT);  Surgeon: Claybon Jabs, MD;  Location[facility];  Service: Urology;  Laterality: N/A;  TRANSURETHRAL RESECTION OF BLADDER TUMOR  05/09/2011   Procedure: TRANSURETHRAL RESECTION OF BLADDER TUMOR (TURBT);  Surgeon: Claybon Jabs, MD;  Location[facility];  Service: Urology;  Laterality: N/A;   TRANSURETHRAL RESECTION OF BLADDER TUMOR  03/08/2012   Procedure: TRANSURETHRAL RESECTION OF BLADDER TUMOR (TURBT);  Surgeon: Claybon Jabs, MD;  Location: [facility];  Service: Urology;  Laterality: N/A;   TRANSURETHRAL RESECTION OF BLADDER TUMOR WITH GYRUS (TURBT-GYRUS) N/A 03/24/2014   Procedure: TRANSURETHRAL RESECTION OF BLADDER TUMOR WITH GYRUS (TURBT-GYRUS);  Surgeon: Claybon Jabs, MD;  Location: [facility];  Service: Urology;  Laterality: N/A;   UMBILICAL HERNIA REPAIR  2001  (approx)   Family History  Problem Relation Age of Onset   Coronary artery disease Father 36   Hyperlipidemia Father    Heart attack  Mother 40   Hyperlipidemia Mother    Osteoporosis Sister    Hyperlipidemia Sister    Heart attack Daughter 41       CABG x 5   Heart disease Son 62       Stents x 3   Social History   Socioeconomic History   Marital status: Married    Spouse name: Jeneen Rinks   Number of children: 3   Years of education: 12   Highest education level: High school graduate  Occupational History   Occupation: retired  Tobacco Use   Smoking status: Former    Packs/day: 0.50    Years: 5.00    Pack years: 2.50    Types: Cigarettes    Quit date: 03/09/1976    Years since quitting: 44.7   Smokeless tobacco: Never  Vaping Use   Vaping Use: Never used  Substance and Sexual Activity   Alcohol use: Yes    Alcohol/week: 4.0 standard drinks    Types: 4 Glasses of wine per week    Comment: red wine   Drug use: No   Sexual activity: Not Currently  Other Topics Concern   Not on file  Social History Narrative   Married with 3 children and 4 grandchildren.  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 with Parkinsons   Lives in garage apartment at her daughters home.   Social Determinants of Health   Financial Resource Strain: Low Risk    Difficulty of Paying Living Expenses: Not hard at all  Food Insecurity: No Food Insecurity   Worried About Charity fundraiser in the Last Year: Never true   [place] in the Last Year: Never true  Transportation Needs: No Transportation Needs   Lack of Transportation (Medical): No   Lack of Transportation (Non-Medical): No  Physical Activity: Insufficiently Active   Days of Exercise per Week: 7 days   Minutes of Exercise per Session: 10 min  Stress: Stress Concern Present   Feeling of Stress : To some extent  Social Connections: Engineer, building services of Communication with Friends and Family: More than three times a week   Frequency of Social Gatherings with Friends and Family: More than three times a week   Attends Religious Services: 1 to 4 times per year   Active  Member of [employer] or Organizations: Yes   Attends Music therapist: More than 4 times per year   Marital Status: Married    Tobacco Counseling Counseling given: Not Answered   Clinical Intake:  Pre-visit preparation completed: Yes  Pain : No/denies pain     BMI - recorded: 31 Nutritional  Status: BMI > 30  Obese Nutritional Risks: None Diabetes: No  How often do you need to have someone help you when you read instructions, pamphlets, or other written materials from your doctor or pharmacy?: 1 - Never  Diabetic? no  Interpreter Needed?: No  Information entered by :: Kayvion Arneson, LPN   Activities of Daily Living In your present state of health, do you have any difficulty performing the following activities: 12/05/2020  Hearing? Y  Comment wears hearing aids  Vision? N  Difficulty concentrating or making decisions? N  Walking or climbing stairs? N  Dressing or bathing? N  Doing errands, shopping? N  Preparing Food and eating ? N  Using the Toilet? N  In the past six months, have you accidently leaked urine? Y  Comment mild  Do you have problems with loss of bowel control? N  Managing your Medications? N  Managing your Finances? N  Housekeeping or managing your Housekeeping? N  Some recent data might be hidden    Patient Care Team: [name], DO as PCP - General (Family Medicine) Minus Breeding, MD as PCP - Cardiology (Cardiology) Kathie Rhodes, MD (Inactive) as Consulting Physician (Urology) Katy Apo, MD as Consulting Physician (Ophthalmology) Shea Evans [name], LCSW as [place] Management (Licensed Clinical Social Worker) Ilean China, RN as Case Manager Lajuana Matte, MD as Consulting Physician (Cardiothoracic Surgery)  Indicate any recent [place] you may have received from other than [facility] providers in the past year (date may be approximate).     Assessment:   This is a routine wellness  examination for [place].  Hearing/Vision screen Hearing Screening - Comments:: Wears hearing aids - from [facility] [place] - Comments:: Wears reading glasses prn only - up to date with annual eye exams with Dr Prudencio Burly  Dietary issues and exercise activities discussed: Current Exercise Habits: Home exercise routine, Type of exercise: walking;stretching, Time (Minutes): 15, Frequency (Times/Week): 7, Weekly Exercise (Minutes/Week): 105, Intensity: Mild, Exercise limited by: cardiac condition(s)   Goals Addressed               This Visit's Progress     "I want to keep my blood pressure under control" (pt-stated)   On track     [place] (see longitudinal plan of care for additional care plan information)  Current Barriers:  Chronic Disease Management support and education needs related to hypertension  Nurse Case Manager Clinical Goal(s):  Over the next 60 days, patient will work with Consulting civil engineer regarding hypertension Over the next 90 days, patient will keep all scheduled medical appointments Over the next 90 days, patient will take all medications as prescribed Over the next 90 days, patient will report any new or worsening symptoms to PCP or cardiologist  Interventions:  Inter-disciplinary care team collaboration (see longitudinal plan of care) Chart reviewed including recent notes: [facility], urgent care, telephone, and cardiac rehab Reviewed medications  Discussed home monitoring Encouraged to continue measuring and recording BP daily and to call PCP or cardiologist with any readings outside of provider recommended range Recommend to limit sodium intake Provided with RNCM contact information and encouraged to reach out as needed  Patient Self Care Activities:  Performs ADL's independently Performs IADL's independently  Please see past updates related to this goal by clicking on the "Past Updates" button in the selected goal        DIET - REDUCE  SUGAR INTAKE   On track  Patient states she would like to lose 10 lbs and plans to do this by decreasing her carbohydrate intake.        Depression Screen PHQ 2/9 Scores 12/05/2020 10/26/2020 08/27/2020 08/02/2020 07/13/2020 01/27/2020 12/09/2019  PHQ - 2 Score 0 0 0 0 0 0 0  PHQ- 9 Score 0 0 0 2 - - -    Fall Risk Fall Risk  12/05/2020 08/27/2020 07/13/2020 01/27/2020 12/09/2019  Falls in the past year? 0 0 0 0 0  Number falls in past yr: 0 - - - -  Injury with Fall? 0 - - - -  Risk for fall due to : No Fall Risks - - - -  Follow up Falls prevention discussed - - - -  Comment - - - - -    FALL RISK PREVENTION PERTAINING TO THE HOME:  Any stairs in or around the home? Yes  If so, are there any without handrails? No  Home free of loose throw rugs in walkways, pet beds, electrical cords, etc? Yes  Adequate lighting in your home to reduce risk of falls? Yes   ASSISTIVE DEVICES UTILIZED TO PREVENT FALLS:  Life alert? No  Use of a cane, walker or w/c? No  Grab bars in the bathroom? Yes  Shower chair or bench in shower? Yes  Elevated toilet seat or a handicapped toilet? Yes   TIMED UP AND GO:  Was the test performed? No . Telephonic visit  Cognitive Function: Normal cognitive status assessed by direct observation by this Nurse Health Advisor. No abnormalities found.    MMSE - Mini Mental State Exam 12/01/2017  Orientation to time 5  Orientation to Place 5  Registration 3  Attention/ Calculation 5  Recall 3  Language- name 2 objects 2  Language- repeat 1  Language- follow 3 step command 3  Language- read & follow direction 1  Write a sentence 1  Copy design 1  Total score 30     6CIT Screen 12/05/2019 12/02/2018  What Year? 0 points 0 points  What month? 0 points 0 points  What time? 0 points 0 points  Count back from 20 0 points 0 points  Months in reverse 0 points 0 points  Repeat phrase 0 points 2 points  Total Score 0 2    Immunizations Immunization History   Administered Date(s) Administered   Fluad Quad(high Dose 65+) 11/17/2018, 12/09/2019   Influenza, High Dose Seasonal PF 12/07/2015, 12/16/2016, 12/21/2017   Influenza,inj,Quad PF,6+ Mos 12/21/2012, 01/04/2014, 12/25/2014   PFIZER(Purple Top)SARS-COV-2 Vaccination 03/30/2019, 06/25/2019, 11/29/2019, 08/02/2020   Pneumococcal Conjugate-13 06/28/2014   Pneumococcal Polysaccharide-23 11/04/2012   Tdap 10/09/2010   Zoster, Live 07/15/2006    TDAP status: Due, Education has been provided regarding the importance of this vaccine. Advised may receive this vaccine at local pharmacy or Health Dept. Aware to provide a copy of the vaccination record if obtained from local pharmacy or Health Dept. Verbalized acceptance and understanding.  Flu Vaccine status: Due, Education has been provided regarding the importance of this vaccine. Advised may receive this vaccine at local pharmacy or Health Dept. Aware to provide a copy of the vaccination record if obtained from local pharmacy or Health Dept. Verbalized acceptance and understanding.  Pneumococcal vaccine status: Up to date  Covid-19 vaccine status: Completed vaccines  Qualifies for Shingles Vaccine? Yes   Zostavax completed Yes   Shingrix Completed?: No.    Education has been provided regarding the importance of this vaccine. Patient has been advised  to call insurance company to determine out of pocket expense if they have not yet received this vaccine. Advised may also receive vaccine at local pharmacy or Health Dept. Verbalized acceptance and understanding.  Screening Tests Health Maintenance  Topic Date Due   Zoster Vaccines- Shingrix (1 of 2) Never done   INFLUENZA VACCINE  10/08/2020   TETANUS/TDAP  10/08/2020   COVID-19 Vaccine (5 - Booster for Pfizer series) 12/03/2020   DEXA SCAN  01/26/2022   HPV VACCINES  Aged Out   MAMMOGRAM  Discontinued    Health Maintenance  Health Maintenance Due  Topic Date Due   Zoster Vaccines- Shingrix  (1 of 2) Never done   INFLUENZA VACCINE  10/08/2020   TETANUS/TDAP  10/08/2020   COVID-19 Vaccine (5 - Booster for Pfizer series) 12/03/2020    Colorectal cancer screening: No longer required.   Mammogram status: No longer required due to age.  Bone Density status: Completed 01/27/2020. Results reflect: Bone density results: OSTEOPENIA. Repeat every 2 years.  Lung Cancer Screening: (Low Dose CT Chest recommended if Age 35-80 years, 30 pack-year currently smoking OR have quit w/in 15years.) does not qualify.  Additional Screening:  Hepatitis C Screening: does not qualify  Vision Screening: Recommended annual ophthalmology exams for early detection of glaucoma and other disorders of the eye. Is the patient up to date with their annual eye exam?  Yes  Who is the provider or what is the name of the office in which the patient attends annual eye exams? [facility] If pt is not established with a provider, would they like to be referred to a provider to establish care? No .   Dental Screening: Recommended annual dental exams for proper oral hygiene  Community Resource Referral / Chronic Care Management: CRR required this visit?  No   CCM required this visit?  No      Plan:     I have personally reviewed and noted the following in the patient's chart:   Medical and social history Use of alcohol, tobacco or illicit drugs  Current medications and supplements including opioid prescriptions.  Functional ability and status Nutritional status Physical activity Advanced directives List of other physicians Hospitalizations, surgeries, and ER visits in previous 12 months Vitals Screenings to include cognitive, depression, and falls Referrals and appointments  In addition, I have reviewed and discussed with patient certain preventive protocols, quality metrics, and best practice recommendations. A written personalized care plan for preventive services as well as general preventive health  recommendations were provided to patient.     Sandrea Hammond, LPN   [id number]   Nurse Notes: None

## 2020-12-06 ENCOUNTER — Telehealth: Payer: Medicare Other

## 2020-12-11 IMAGING — CR DG CHEST 2V
2 series · 2 of 2 positions shown · non-contrast
Comparison: 04/15/2019

CLINICAL DATA: Shortness of breath

EXAM:
CHEST - 2 VIEW

[chest lat]
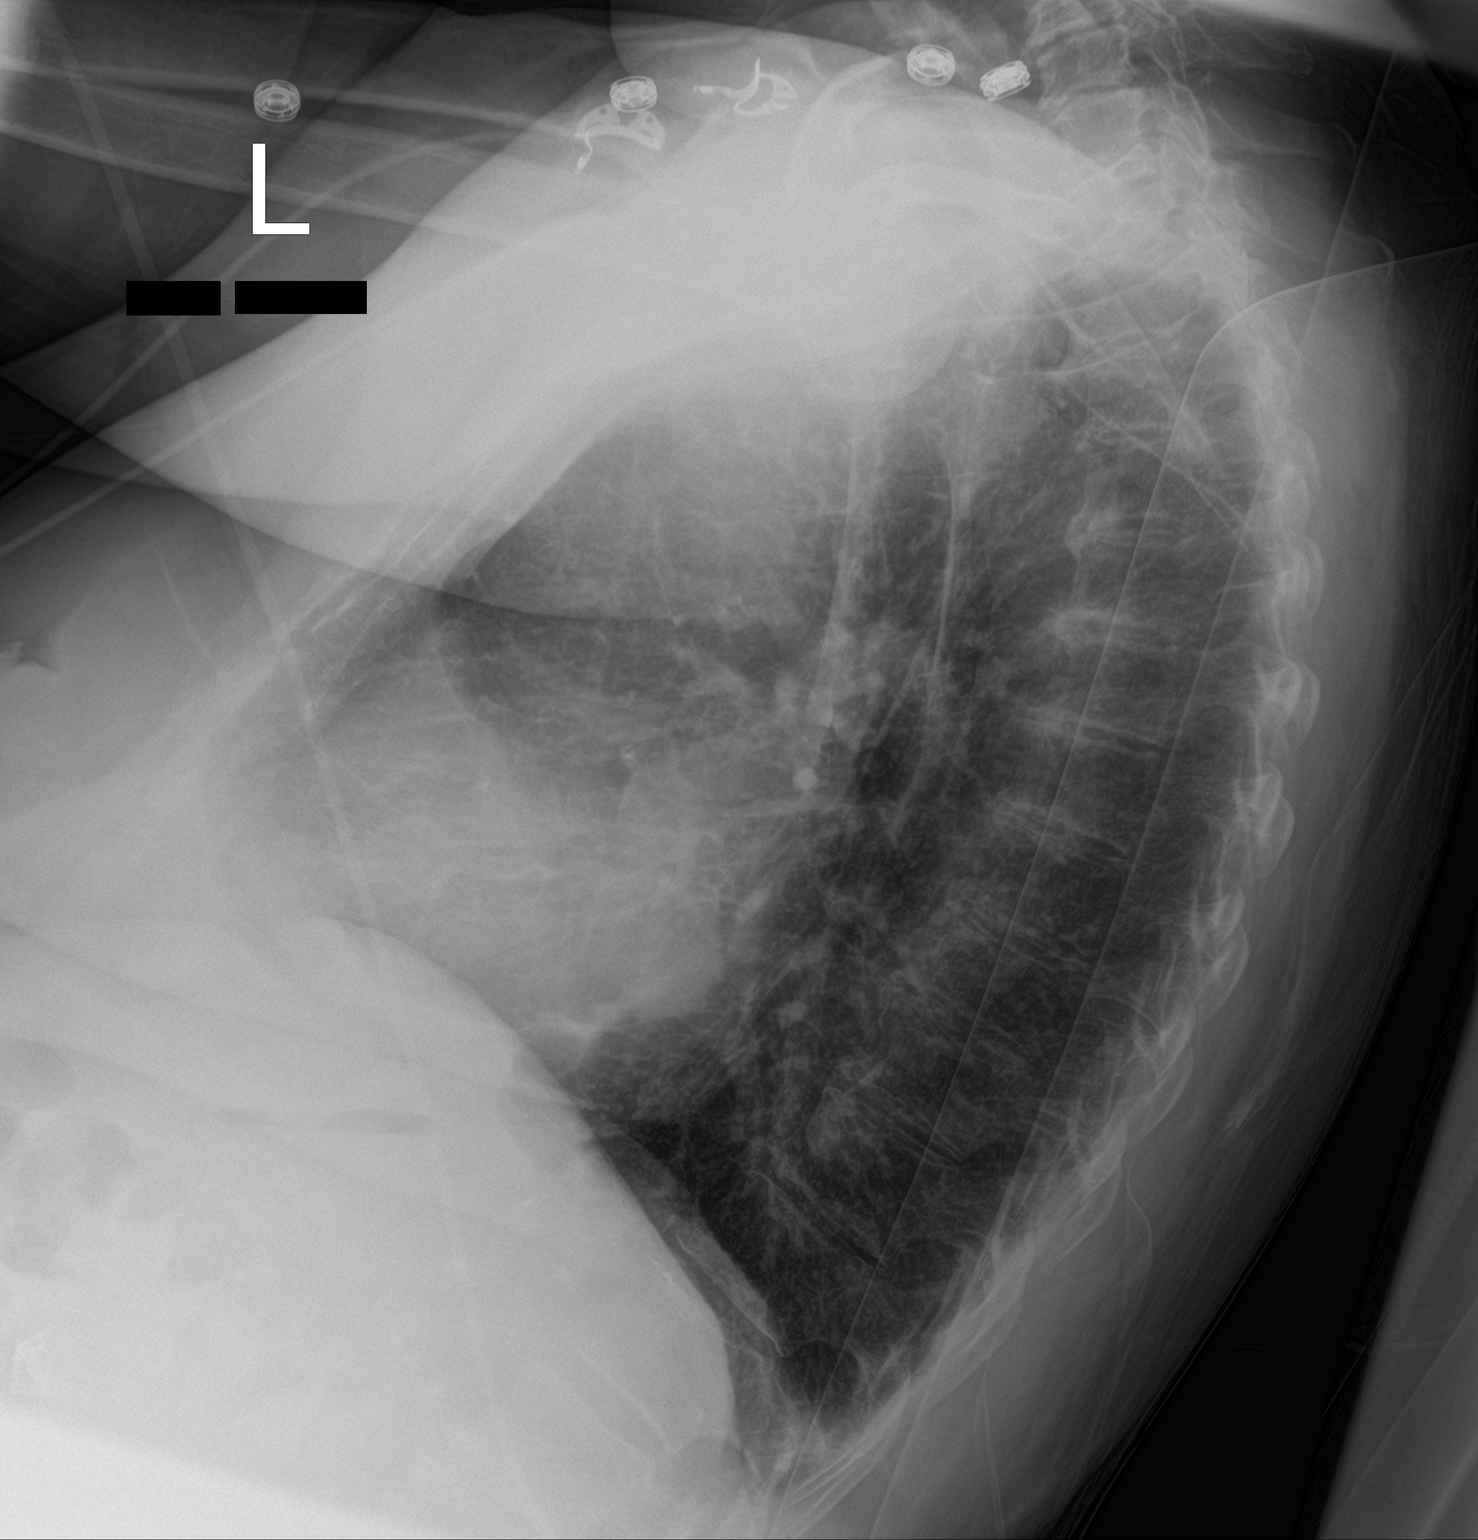

[chest ap]
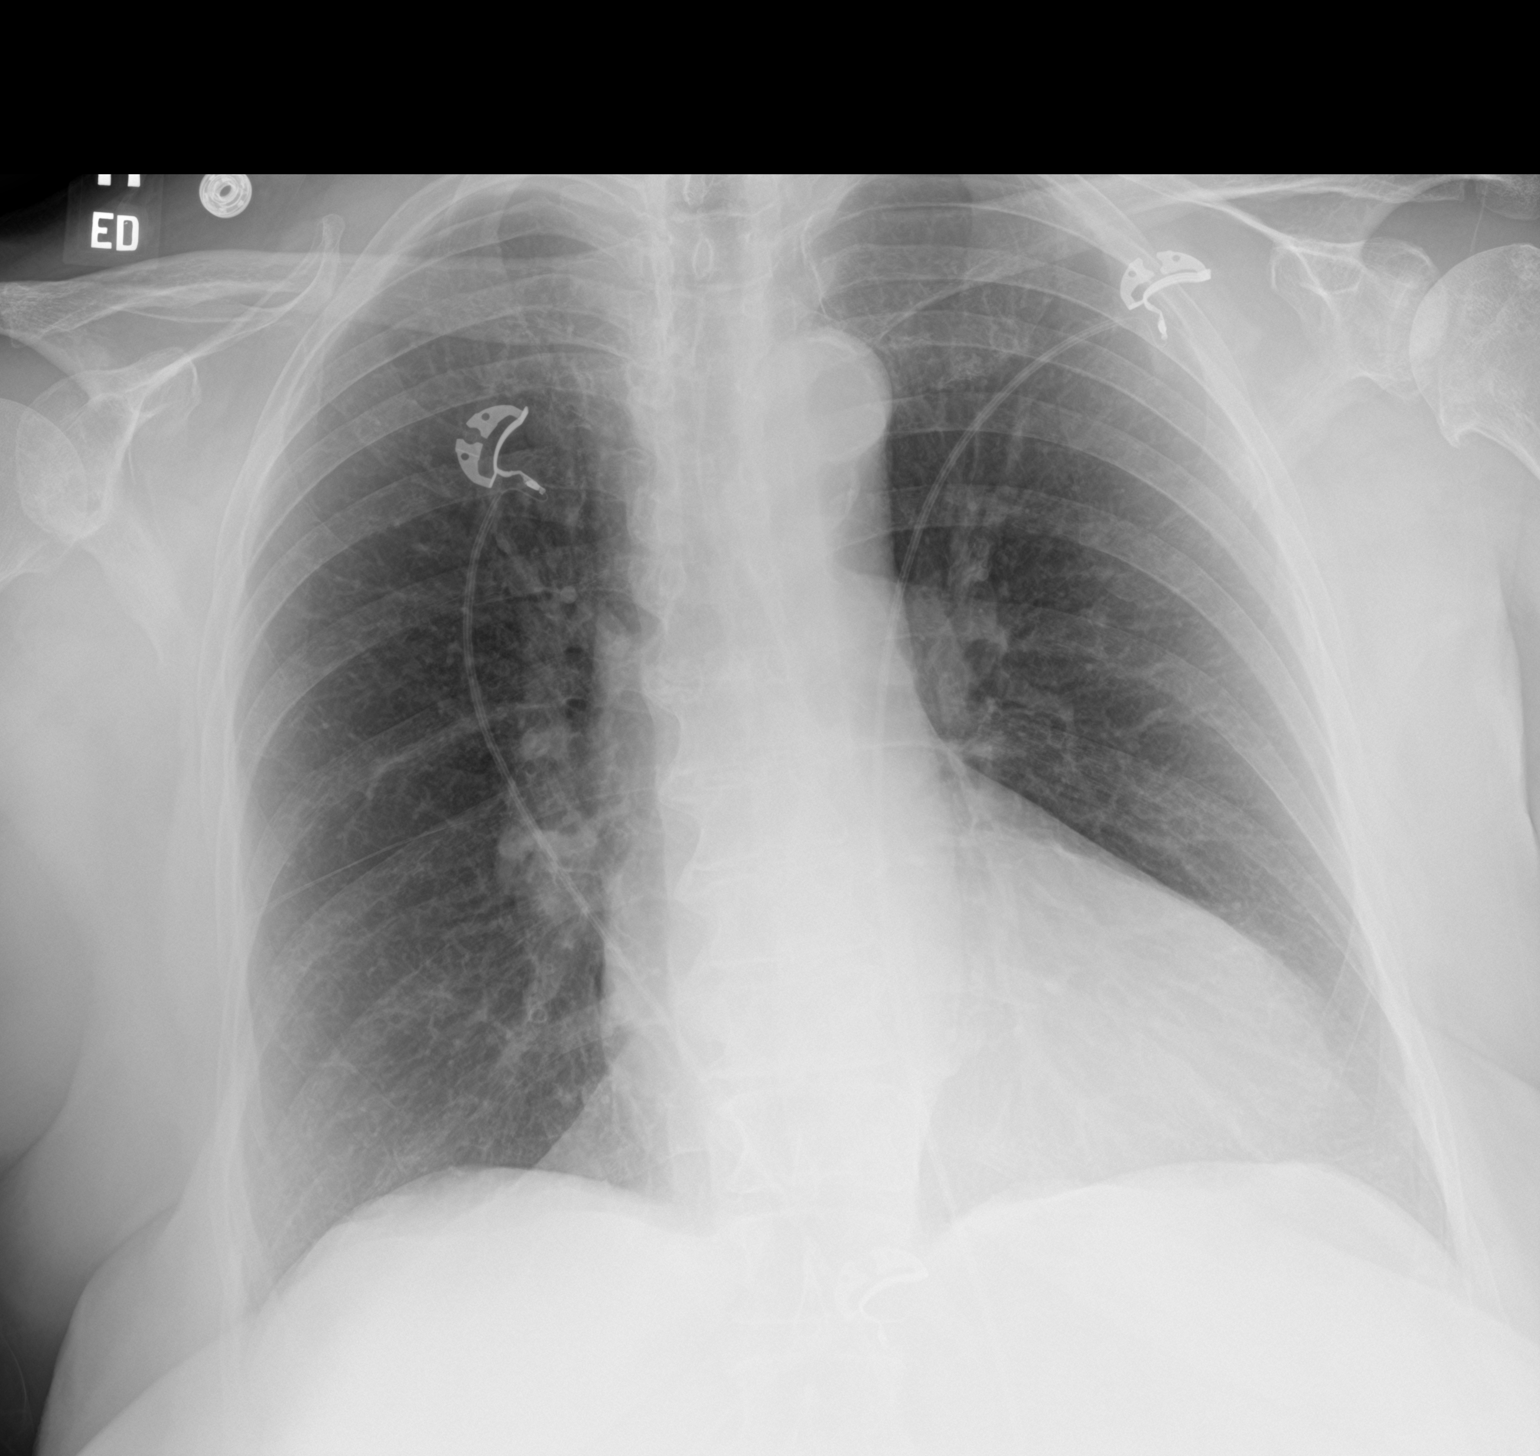

[2 of 2 positions shown; findings below may reference images not displayed]

FINDINGS: Cardiac shadow is at the upper limits of normal in size. Aortic
calcifications are seen. The lungs are well aerated bilaterally.
Degenerative changes of the thoracic spine are seen.
IMPRESSION: No acute abnormality noted.

## 2020-12-13 ENCOUNTER — Other Ambulatory Visit: Payer: Self-pay | Admitting: Family Medicine

## 2020-12-17 IMAGING — DX DG CHEST 1V PORT
1 series · 1 of 1 positions shown · non-contrast
Comparison: 04/28/2019 chest radiograph.

CLINICAL DATA: Status post CABG

EXAM:
PORTABLE CHEST 1 VIEW

[chest ap]
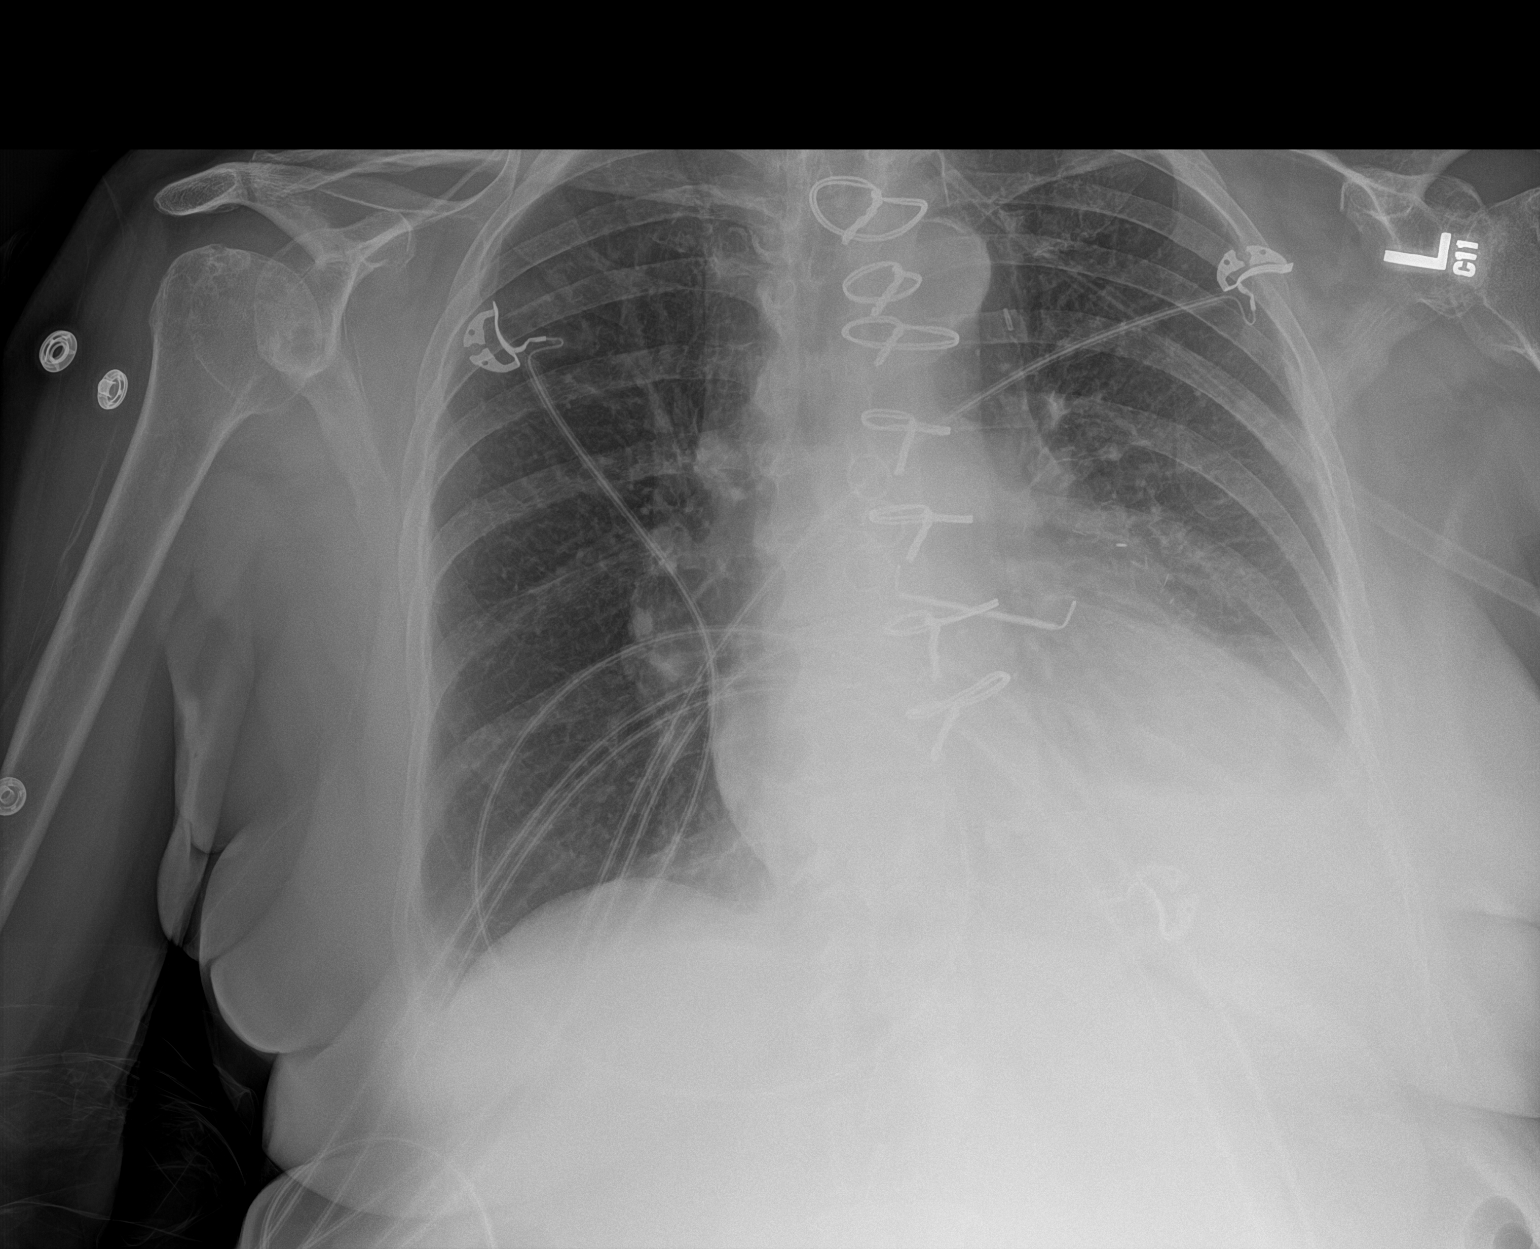

[1 of 1 positions shown; findings below may reference images not displayed]

FINDINGS: Intact sternotomy wires. Stable cardiomediastinal silhouette with
mild cardiomegaly. No pneumothorax. Small left pleural effusion,
slightly decreased. No significant right pleural effusion. No overt
pulmonary edema. Improved aeration at left lung base with residual
mild atelectasis.
IMPRESSION: 1. Small left pleural effusion, slightly decreased.
2. Improved aeration at the left lung base with residual mild
atelectasis.
3. Stable mild cardiomegaly without overt pulmonary edema.

## 2020-12-25 ENCOUNTER — Other Ambulatory Visit: Payer: Self-pay | Admitting: Cardiology

## 2020-12-25 DIAGNOSIS — I480 Paroxysmal atrial fibrillation: Secondary | ICD-10-CM

## 2020-12-25 NOTE — Telephone Encounter (Signed)
Prescription refill request for Eliquis received. Indication:afib Last office visit:hochrein 07/04/20 Scr:0.84 11/29/20 Age: 4f Weight:82.4kg

## 2021-01-09 ENCOUNTER — Ambulatory Visit (INDEPENDENT_AMBULATORY_CARE_PROVIDER_SITE_OTHER): Payer: Medicare Other | Admitting: Family Medicine

## 2021-01-09 ENCOUNTER — Other Ambulatory Visit: Payer: Self-pay

## 2021-01-09 ENCOUNTER — Encounter: Payer: Self-pay | Admitting: Family Medicine

## 2021-01-09 VITALS — BP 142/88 | HR 119 | Temp 98.3°F | Ht 63.0 in | Wt 179.4 lb

## 2021-01-09 DIAGNOSIS — E039 Hypothyroidism, unspecified: Secondary | ICD-10-CM | POA: Diagnosis not present

## 2021-01-09 NOTE — Patient Instructions (Signed)

## 2021-01-09 NOTE — Progress Notes (Signed)
Subjective: CC: Hypothyroidism PCP: [name], DO QIO:NGEX T Moro is a 80 y.o. female presenting to clinic today for:  1.  Hypothyroidism Patient is compliant with thyroid medication.  No difficulty swallowing, tremor, heart palpitations, change in bowel habits.  She has follow-up with her cardiologist in about 3 months.   ROS: Per HPI  Allergies  Allergen Reactions   Fish Allergy Hives, Shortness Of Breath and Other (See Comments)    "Bottom-feeder fish"   Fish-Derived Products Hives, Shortness Of Breath and Other (See Comments)    Bottom-feeder fish   Shellfish-Derived Products Hives and Shortness Of Breath   Lasix [Furosemide] Swelling and Other (See Comments)    Edema, elevated BP   Macrodantin [Nitrofurantoin Macrocrystal] Other (See Comments)    Unknown reaction    Vicodin [Hydrocodone-Acetaminophen] Nausea And Vomiting and Other (See Comments)    SEVERE N/V   Zetia [Ezetimibe] Other (See Comments)    Muscle aches   Codeine Rash   Latex Itching and Rash    Reports Elastic in underwear, under wire bras, and now surgical cap cause rash and itching.    Niacin Other (See Comments)    Muscle aches   Niaspan [Niacin Er] Other (See Comments)    Muscle aches   Statins Other (See Comments)    Muscle aches/ cramps   Sulfa Antibiotics Rash and Other (See Comments)    Muscle aches/ cramps, also   Past Medical History:  Diagnosis Date   AAA (abdominal aortic aneurysm) [facility])    cardiology aware    Arthritis    OA lt knee- cortizone inj. q 4 months AND OA ALSO IN BACK. Had knee replaced-issue resolved   CAD (coronary artery disease) CARDIOLOGIST- DR [facility]--  VISIT 01-02-10 IN EPIC   1993-- PTCA. Pt describes a near total blockage of apparently the LAD and a 65% stenosis elsewhere.   Cancer [facility])    History of bladder cancer followed by dr Karsten Ro   hx  TCC of bladder ,  Ta G3-  first occurence 02-20-2012--  s/p BCG tx's  and  mitomycin C   Hyperlipidemia  1993   Hypertension 1993   Nocturia    Osteopenia    Sigmoid diverticulosis     Current Outpatient Medications:    acetaminophen (TYLENOL) 500 MG tablet, Take 2 tablets (1,000 mg total) by mouth every 8 (eight) hours., Disp: 30 tablet, Rfl: 0   amLODipine (NORVASC) 5 MG tablet, TAKE 1 TABLET DAILY, Disp: 90 tablet, Rfl: 3   apixaban (ELIQUIS) 5 MG TABS tablet, TAKE 1 TABLET 2 TIMES A DAY. LABS NEEDED FOR REFILLS, Disp: 180 tablet, Rfl: 1   aspirin EC 81 MG EC tablet, Take 1 tablet (81 mg total) by mouth daily., Disp:  , Rfl:    betamethasone valerate (VALISONE) 0.1 % cream, Apply 1 application topically at bedtime. Apply externally to vaginal area, Disp: 45 g, Rfl: 1   cephALEXin (KEFLEX) 500 MG capsule, Take 1 capsule (500 mg total) by mouth 2 (two) times daily., Disp: 14 capsule, Rfl: 0   cholecalciferol (VITAMIN D) 1000 UNITS tablet, Take 1,000 Units by mouth daily after breakfast. , Disp: , Rfl:    clindamycin (CLEOCIN) 2 % vaginal cream, Place 1 Applicatorful vaginally at bedtime., Disp: 40 g, Rfl: 2   Co-Enzyme Q10 200 MG CAPS, Take 200 mg by mouth daily after breakfast. , Disp: , Rfl:    digoxin (LANOXIN) 0.125 MG tablet, TAKE 1 TABLET DAILY, Disp: 90 tablet, Rfl: 1  Flaxseed, Linseed, (FLAX SEED OIL) 1000 MG CAPS, Take 1,000 mg by mouth daily after breakfast. , Disp: , Rfl:    gabapentin (NEURONTIN) 100 MG capsule, Take 2 capsules (200 mg total) by mouth at bedtime. (Patient not taking: Reported on 12/05/2020), Disp: 180 capsule, Rfl: 0   GNP VITAMIN B-12 500 MCG tablet, TAKE 1 TABLET DAILY, Disp: 90 tablet, Rfl: 0   levothyroxine (SYNTHROID) 50 MCG tablet, TAKE 1 TABLET DAILY, Disp: 90 tablet, Rfl: 0   Magnesium 300 MG CAPS, Take 300 mg by mouth at bedtime. , Disp: , Rfl:    metoprolol tartrate (LOPRESSOR) 100 MG tablet, Take 0.5 tablets (50 mg total) by mouth 2 (two) times daily., Disp: 90 tablet, Rfl: 3   nitroGLYCERIN (NITROSTAT) 0.4 MG SL tablet, PLACE 1 TABLET UNDER THE TONGUE  AT ONSET OF CHEST PAIN EVERY 5 MINTUES UP TO 3 TIMES AS NEEDED, Disp: 25 tablet, Rfl: 0   REPATHA SURECLICK 297 MG/ML SOAJ, INJECT 140MG  (1ML) EVERY 2 WEEKS, Disp: 2 mL, Rfl: 11   Rimegepant Sulfate (NURTEC) 75 MG TBDP, Dissolve 1 tablet in mouth ONCE per day if needed for headache, Disp: 2 tablet, Rfl: 0   TURMERIC PO, Take 1 capsule by mouth daily after breakfast., Disp: , Rfl:  No current facility-administered medications for this visit.  Facility-Administered Medications Ordered in Other Visits:    DOBUTamine (DOBUTREX) 1,000 mcg/mL in dextrose 5% 250 mL infusion, 20 mcg/kg/min, Intravenous, Continuous, Dorothy Spark, MD, Last Rate: 96.4 mL/hr at 12/24/15 0840, 20 mcg/kg/min at 12/24/15 0840 Social History   Socioeconomic History   Marital status: Married    Spouse name: Jeneen Rinks   Number of children: 3   Years of education: 12   Highest education level: High school graduate  Occupational History   Occupation: retired  Tobacco Use   Smoking status: Former    Packs/day: 0.50    Years: 5.00    Pack years: 2.50    Types: Cigarettes    Quit date: 03/09/1976    Years since quitting: 44.8   Smokeless tobacco: Never  Vaping Use   Vaping Use: Never used  Substance and Sexual Activity   Alcohol use: Yes    Alcohol/week: 4.0 standard drinks    Types: 4 Glasses of wine per week    Comment: red wine   Drug use: No   Sexual activity: Not Currently  Other Topics Concern   Not on file  Social History Narrative   Married with 3 children and 4 grandchildren.  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 with Parkinsons   Lives in garage apartment at her daughters home.   Social Determinants of Health   Financial Resource Strain: Low Risk    Difficulty of Paying Living Expenses: Not hard at all  Food Insecurity: No Food Insecurity   Worried About Charity fundraiser in the Last Year: Never true   [place] in the Last Year: Never true  Transportation Needs: No Transportation Needs   Lack of  Transportation (Medical): No   Lack of Transportation (Non-Medical): No  Physical Activity: Insufficiently Active   Days of Exercise per Week: 7 days   Minutes of Exercise per Session: 10 min  Stress: Stress Concern Present   Feeling of Stress : To some extent  Social Connections: Socially Integrated   Frequency of Communication with Friends and Family: More than three times a week   Frequency of Social Gatherings with Friends and Family: More than three times a week  Attends Religious Services: 1 to 4 times per year   Active Member of Clubs or Organizations: Yes   Attends Music therapist: More than 4 times per year   Marital Status: Married  Human resources officer Violence: Not At Risk   Fear of Current or Ex-Partner: No   Emotionally Abused: No   Physically Abused: No   Sexually Abused: No   Family History  Problem Relation Age of Onset   Coronary artery disease Father 61   Hyperlipidemia Father    Heart attack Mother 82   Hyperlipidemia Mother    Osteoporosis Sister    Hyperlipidemia Sister    Heart attack Daughter 38       CABG x 5   Heart disease Son 13       Stents x 3    Objective: Office vital signs reviewed. BP (!) 142/88   Pulse (!) 119   Temp 98.3 F (36.8 C)   Ht 5\' 3"  (1.6 m)   Wt 179 lb 6.4 oz (81.4 kg)   SpO2 95%   BMI 31.78 kg/m   Physical Examination:  General: Awake, alert, well nourished, No acute distress HEENT: Normal; no exophthalmos or goiter Cardio: Irregularly irregular with rate control, S1S2 heard, no murmurs appreciated Pulm: clear to auscultation bilaterally, no wheezes, rhonchi or rales; normal work of breathing on room air Skin: dry; intact; no rashes or lesions; normal temperature Neuro: No tremor  Assessment/ Plan: 80 y.o. female   Acquired hypothyroidism - Plan: TSH, T4, Free  Asymptomatic from a thyroid standpoint.  Check thyroid levels.  May follow-up in 6 months for full physical exam and fasting labs, sooner if  concerns arise  No orders of the defined types were placed in this encounter.  No orders of the defined types were placed in this encounter.    [name], DO [place] [phone]

## 2021-01-10 LAB — T4, FREE: Free T4: 1.28 ng/dL (ref 0.82–1.77)

## 2021-01-10 LAB — TSH: TSH: 2.02 u[IU]/mL (ref 0.450–4.500)

## 2021-01-22 ENCOUNTER — Ambulatory Visit (INDEPENDENT_AMBULATORY_CARE_PROVIDER_SITE_OTHER): Payer: Medicare Other | Admitting: Licensed Clinical Social Worker

## 2021-01-22 DIAGNOSIS — I739 Peripheral vascular disease, unspecified: Secondary | ICD-10-CM

## 2021-01-22 DIAGNOSIS — E785 Hyperlipidemia, unspecified: Secondary | ICD-10-CM

## 2021-01-22 DIAGNOSIS — I10 Essential (primary) hypertension: Secondary | ICD-10-CM

## 2021-01-22 DIAGNOSIS — I4819 Other persistent atrial fibrillation: Secondary | ICD-10-CM

## 2021-01-22 DIAGNOSIS — M5136 Other intervertebral disc degeneration, lumbar region: Secondary | ICD-10-CM

## 2021-01-22 DIAGNOSIS — Z951 Presence of aortocoronary bypass graft: Secondary | ICD-10-CM

## 2021-01-22 DIAGNOSIS — R7989 Other specified abnormal findings of blood chemistry: Secondary | ICD-10-CM

## 2021-01-22 DIAGNOSIS — I5031 Acute diastolic (congestive) heart failure: Secondary | ICD-10-CM

## 2021-01-22 NOTE — Chronic Care Management (AMB) (Signed)
Chronic Care Management    Clinical Social Work Note  01/22/2021 Name: Gabriela Smith MRN: [id number] DOB: 02/03/1941  Gabriela Smith is a 80 y.o. year old female who is a primary care patient of [name], DO. The CCM team was consulted to assist the patient with chronic disease management and/or care coordination needs related to: [employer] .   Engaged with patient by telephone for follow up visit in response to provider referral for social work chronic care management and care coordination services.   Consent to Services:  The patient was given information about Chronic Care Management services, agreed to services, and gave verbal consent prior to initiation of services.  Please see initial visit note for detailed documentation.   Patient agreed to services and consent obtained.   Assessment: Review of patient past medical history, allergies, medications, and health status, including review of relevant consultants reports was performed today as part of a comprehensive evaluation and provision of chronic care management and care coordination services.     SDOH (Social Determinants of Health) assessments and interventions performed:  SDOH Interventions    Flowsheet Row Most Recent Value  SDOH Interventions   Physical Activity Interventions Other (Comments)  [client has occasional swelling in her ankle. she has burning in her feet occasionally.  does not use assistive device to walk.]  Stress Interventions Provide Counseling  [client has stress related to managing health needs of her spouse. client has stress related to managing her own health needs]        Advanced Directives Status: See Vynca application for related entries.  CCM Care Plan  Allergies  Allergen Reactions   Fish Allergy Hives, Shortness Of Breath and Other (See Comments)    "Bottom-feeder fish"   Fish-Derived Products Hives, Shortness Of Breath and Other (See Comments)    Bottom-feeder fish    Shellfish-Derived Products Hives and Shortness Of Breath   Lasix [Furosemide] Swelling and Other (See Comments)    Edema, elevated BP   Macrodantin [Nitrofurantoin Macrocrystal] Other (See Comments)    Unknown reaction    Vicodin [Hydrocodone-Acetaminophen] Nausea And Vomiting and Other (See Comments)    SEVERE N/V   Zetia [Ezetimibe] Other (See Comments)    Muscle aches   Codeine Rash   Latex Itching and Rash    Reports Elastic in underwear, under wire bras, and now surgical cap cause rash and itching.    Niacin Other (See Comments)    Muscle aches   Niaspan [Niacin Er] Other (See Comments)    Muscle aches   Statins Other (See Comments)    Muscle aches/ cramps   Sulfa Antibiotics Rash and Other (See Comments)    Muscle aches/ cramps, also    Outpatient Encounter Medications as of 01/22/2021  Medication Sig   acetaminophen (TYLENOL) 500 MG tablet Take 2 tablets (1,000 mg total) by mouth every 8 (eight) hours.   amLODipine (NORVASC) 5 MG tablet TAKE 1 TABLET DAILY   apixaban (ELIQUIS) 5 MG TABS tablet TAKE 1 TABLET 2 TIMES A DAY. LABS NEEDED FOR REFILLS   aspirin EC 81 MG EC tablet Take 1 tablet (81 mg total) by mouth daily.   betamethasone valerate (VALISONE) 0.1 % cream Apply 1 application topically at bedtime. Apply externally to vaginal area   cholecalciferol (VITAMIN D) 1000 UNITS tablet Take 1,000 Units by mouth daily after breakfast.    clindamycin (CLEOCIN) 2 % vaginal cream Place 1 Applicatorful vaginally at bedtime.   Co-Enzyme Q10 200 MG  CAPS Take 200 mg by mouth daily after breakfast.    digoxin (LANOXIN) 0.125 MG tablet TAKE 1 TABLET DAILY   Flaxseed, Linseed, (FLAX SEED OIL) 1000 MG CAPS Take 1,000 mg by mouth daily after breakfast.    GNP VITAMIN B-12 500 MCG tablet TAKE 1 TABLET DAILY   levothyroxine (SYNTHROID) 50 MCG tablet TAKE 1 TABLET DAILY   Magnesium 300 MG CAPS Take 300 mg by mouth at bedtime.    metoprolol tartrate (LOPRESSOR) 100 MG tablet Take 0.5  tablets (50 mg total) by mouth 2 (two) times daily.   nitroGLYCERIN (NITROSTAT) 0.4 MG SL tablet PLACE 1 TABLET UNDER THE TONGUE AT ONSET OF CHEST PAIN EVERY 5 MINTUES UP TO 3 TIMES AS NEEDED   REPATHA SURECLICK 401 MG/ML SOAJ INJECT 140MG  (1ML) EVERY 2 WEEKS   Rimegepant Sulfate (NURTEC) 75 MG TBDP Dissolve 1 tablet in mouth ONCE per day if needed for headache   TURMERIC PO Take 1 capsule by mouth daily after breakfast.   Facility-Administered Encounter Medications as of 01/22/2021  Medication   DOBUTamine (DOBUTREX) 1,000 mcg/mL in dextrose 5% 250 mL infusion    Patient Active Problem List   Diagnosis Date Noted   Persistent atrial fibrillation (Gabriela Smith) 07/03/2020   Edema 07/03/2020   Paroxysmal A-fib (Gabriela Smith) 08/12/2019   S/P CABG x 4 04/26/2019   Acute CHF (congestive heart failure) (Gabriela Smith) 04/15/2019   Disease related peripheral neuropathy 03/07/2019   BMI 33.0-33.9,adult 02/17/2019   Intermittent claudication (Gabriela Smith) 02/17/2019   Peripheral vascular insufficiency (Gabriela Smith) 02/17/2019   Aortic atherosclerosis (Gabriela Smith) 02/11/2017   Atrophic vaginitis 09/18/2016   S/P right THA, AA 12/25/2015   S/P hip replacement 12/25/2015   AAA (abdominal aortic aneurysm) without rupture 08/24/2015   Statin intolerance 05/23/2013   Osteopenia of right forearm 04/20/2013   Degenerative disc disease, lumbar 02/08/2013   Vitamin D deficiency 02/08/2013   Overweight (BMI 25.0-29.9) 11/24/2012   S/P left TKA 11/22/2012   Malignant neoplasm of urinary bladder (Gabriela Smith) 11/04/2012   Osteoarthritis of left knee 11/04/2012   Hyperlipidemia LDL goal <70 11/28/2008   Hypertension 11/28/2008   CORONARY ATHEROSCLEROSIS NATIVE CORONARY ARTERY 11/28/2008    Conditions to be addressed/monitored: monitor client management of her medical needs. Monitor mobility of client. Monitor pain issues of client  Care Plan : LCSW Care plan  Updates made by Katha Cabal, LCSW since 01/22/2021 12:00 AM     Problem: Coping  Skills (General Plan of Care)      Goal: Coping Skills Enhanced;Manage Medical Needs   Start Date: 01/22/2021  Expected End Date: 04/22/2021  This Visit's Progress: On track  Recent Progress: On track  Priority: Medium  Note:   Current barriers:   Patient in need of assistance with connecting to community resources for possible help in managing medical needs Patient is unable to independently navigate community resource options without care coordination support Mobility issues Some pain issues in her feet  Clinical Goals:  patient will work with SW in next 30 days to address concerns related to client management of ongoing medical needs Client to call RNCM or LCSW as needed in next 30 days for CCM support Patient will communicate with SW in next 30 days to discuss mobility of client and pain issues of client   Clinical Interventions:  Collaboration with [name], DO regarding development and update of comprehensive plan of care as evidenced by provider attestation and co-signature Discussed client needs related to hearing. She said she wears hearing aids  and functions well with use of hearing aids Discussed with client the mood status of client. She said she is doing well with her mood. She said she is usually in a positive mood. She and her spouse live in apartment beside home of her daughter.  Thus, her daughter is nearby if she or her spouse have any particular needs. Discussed socialization of client. She said she is a social person. She said she and her spouse have invited friends to come to their home this week to share a meal together.  She talks with family and friends via phone. She likes to interact with others socially. Reviewed health needs of her spouse. She said her spouse has diagnosis of Parkinson's Disease. She said that she and her spouse are managing his health needs well at present, with help of their daughter.  Reviewed DME equipment of client. She has needed  DME items in the home. Discussed Eliquis costs for client. She said her insurance is paying for her Eliquis prescription currently.  She said in a few months, perhaps in May of next year, her Eliquis costs may go up. Reviewed her support from cardiologist.   Encouraged client to call RNCM as needed for CCM nursing support Encouraged client to call LCSW as needed for SW support Discussed mobility of client. Discussed pain issues of client (she spoke of chronic pain in her feet)   Patient Strengths/Coping Skills: Attends scheduled medical appointments Takes medications as prescribed Has support from daughter, Neita Goodnight  Patient Defictis Mobility issues Some difficulties in completing ADLs Some Pain issues  Patient Goals:  Patient to attend scheduled medical appointments in next 30 days Patient to talk regularly with her daughter, Amy, in next 30 days about needs of client Client to call RNCM or LCSW as needed in next 30 days for CCM support  Follow Up Plan: LCSW to call client on 03/22/21 at 10:00 AM to assess needs of client      [name].Yuritza Paulhus MSW, LCSW Licensed Clinical Social Worker Gabriela Smith Care Management [phone]

## 2021-01-22 NOTE — Patient Instructions (Addendum)
Visit Information  Patient Goals:  Protect My Health (Patient).  Manage Medical needs  Timeframe:  Short-Term Goal Priority:  Medium Progress: On Track Start Date:             01/22/21              Expected End Date:           04/22/21          Follow Up Date 03/22/21 at 10:00 AM   Protect My Health (Patient)  Manage Medical needs    Why is this important?   Screening tests can find diseases early when they are easier to treat.  Your doctor or nurse will talk with you about which tests are important for you.  Getting shots for common diseases like the flu and shingles will help prevent them.     Patient Strengths/Coping Skills: Attends scheduled medical appointments Takes medications as prescribed Has support from daughter, Neita Goodnight  Patient Defictis Mobility issues Some difficulties in completing ADLs Some Pain issues  Patient Goals:  Patient to attend scheduled medical appointments in next 30 days Patient to talk regularly with her daughter, Amy, in next 30 days about needs of client Client to call RNCM or LCSW as needed in next 30 days for CCM support  Follow Up Plan: LCSW to call client on 03/22/21 at 10:00 AM to assess client needs   [name].Brandt Chaney MSW, LCSW Licensed Clinical Social Worker [facility] Care Management [phone]

## 2021-03-05 ENCOUNTER — Telehealth: Payer: Self-pay

## 2021-03-05 NOTE — Telephone Encounter (Signed)
Patient approved for Repatha  [id number] through 03/09/2022

## 2021-03-05 NOTE — Telephone Encounter (Signed)
PA for Repatha submitted via CMM Key: [id number]

## 2021-03-14 ENCOUNTER — Other Ambulatory Visit: Payer: Self-pay | Admitting: Family Medicine

## 2021-03-15 ENCOUNTER — Telehealth: Payer: Self-pay | Admitting: Family Medicine

## 2021-03-22 ENCOUNTER — Telehealth: Payer: Medicare Other

## 2021-03-27 ENCOUNTER — Ambulatory Visit: Payer: Medicare Other

## 2021-03-28 ENCOUNTER — Other Ambulatory Visit: Payer: Self-pay | Admitting: Cardiology

## 2021-03-29 ENCOUNTER — Other Ambulatory Visit: Payer: Self-pay | Admitting: Family Medicine

## 2021-03-29 DIAGNOSIS — Z23 Encounter for immunization: Secondary | ICD-10-CM

## 2021-04-01 ENCOUNTER — Encounter: Payer: Self-pay | Admitting: Nurse Practitioner

## 2021-04-01 ENCOUNTER — Telehealth (INDEPENDENT_AMBULATORY_CARE_PROVIDER_SITE_OTHER): Payer: Medicare Other | Admitting: Nurse Practitioner

## 2021-04-01 DIAGNOSIS — H00012 Hordeolum externum right lower eyelid: Secondary | ICD-10-CM | POA: Diagnosis not present

## 2021-04-01 MED ORDER — ERYTHROMYCIN 5 MG/GM OP OINT: 1.0000 "application " | TOPICAL_OINTMENT | Freq: Every day | OPHTHALMIC | 0 refills | Status: DC

## 2021-04-01 NOTE — Progress Notes (Signed)
° °  Virtual Visit  Note Due to COVID-19 pandemic this visit was conducted virtually. This visit type was conducted due to national recommendations for restrictions regarding the COVID-19 Pandemic (e.g. social distancing, sheltering in place) in an effort to limit this patient's exposure and mitigate transmission in our community. All issues noted in this document were discussed and addressed.  A physical exam was not performed with this format.  I connected with Gabriela Smith on 04/01/21 at 1:08 by telephone and verified that I am speaking with the correct person using two identifiers. Gabriela Smith is currently located at home and no one is currently with her during visit. The provider, Mary-Margaret Hassell Done, FNP is located in their office at time of visit.  I discussed the limitations, risks, security and privacy concerns of performing an evaluation and management service by telephone and the availability of in person appointments. I also discussed with the patient that there may be a patient responsible charge related to this service. The patient expressed understanding and agreed to proceed.   History and Present Illness:  Patient states that she has a stye on right lower lid towards the inside of her nose. She has been using warm compresses. Some better today. Itches.      Review of Systems  Eyes:  Positive for discharge and redness (at times). Negative for photophobia and pain.  All other systems reviewed and are negative.   Observations/Objective: Alert and oriented- answers all questions appropriately No distress   Assessment and Plan: Gabriela Smith in today with chief complaint of Stye   1. Hordeolum externum of right lower eyelid Avoid rubbing eye Continue warm compresses Meds ordered this encounter  Medications   erythromycin ophthalmic ointment    Sig: Place 1 application into the right eye at bedtime.    Dispense:  3.5 g    Refill:  0    Order Specific Question:    Supervising Provider    Answer:   Caryl Pina A [[id number]]      Follow Up Instructions: prn    I discussed the assessment and treatment plan with the patient. The patient was provided an opportunity to ask questions and all were answered. The patient agreed with the plan and demonstrated an understanding of the instructions.   The patient was advised to call back or seek an in-person evaluation if the symptoms worsen or if the condition fails to improve as anticipated.  The above assessment and management plan was discussed with the patient. The patient verbalized understanding of and has agreed to the management plan. Patient is aware to call the clinic if symptoms persist or worsen. Patient is aware when to return to the clinic for a follow-up visit. Patient educated on when it is appropriate to go to the emergency department.   Time call ended:  1:20  I provided 12 minutes of  non face-to-face time during this encounter.    Mary-Margaret Hassell Done, FNP

## 2021-04-01 NOTE — Patient Instructions (Signed)
Stye A stye, also known as a hordeolum, is a bump that forms on an eyelid. It may look like a pimple next to the eyelash. A stye can form inside the eyelid (internal stye) or outside the eyelid (external stye). A stye can cause redness, swelling, and pain on the eyelid. Styes are very common. Anyone can get them at any age. They usually occur in just one eye at a time, but you may have more than one in either eye. What are the causes? A stye is caused by an infection. The infection is almost always caused by bacteria called Staphylococcus aureus. This is a common type of bacteria that lives on the skin. An internal stye may result from an infected oil-producing gland inside the eyelid. An external stye may be caused by an infection at the base of the eyelash (hair follicle). What increases the risk? You are more likely to develop a stye if: You have had a stye before. You have any of these conditions: Red, itchy, inflamed eyelids (blepharitis). A skin condition such as seborrheic dermatitis or rosacea. High fat levels in your blood (lipids). Dry eyes. What are the signs or symptoms? The most common symptom of a stye is eyelid pain. Internal styes are more painful than external styes. Other symptoms may include: Painful swelling of your eyelid. A scratchy feeling in your eye. Tearing and redness of your eye. A pimple-like bump on the edge of the eyelid. Pus draining from the stye. How is this diagnosed? Your health care provider may be able to diagnose a stye just by examining your eye. The health care provider may also check to make sure: You do not have a fever or other signs of a more serious infection. The infection has not spread to other parts of your eye or areas around your eye. How is this treated? Most styes will clear up in a few days without treatment or with warm compresses applied to the area. You may need to use antibiotic drops or ointment to treat an infection. Sometimes,  steroid drops or ointment are used in addition to antibiotics. In some cases, your health care provider may give you a small steroid injection in the eyelid. If your stye does not heal with routine treatment, your health care provider may drain pus from the stye using a thin blade or needle. This may be done if the stye is large, causing a lot of pain, or affecting your vision. Follow these instructions at home: Take over-the-counter and prescription medicines only as told by your health care provider. This includes eye drops or ointments. If you were prescribed an antibiotic medicine, steroid medicine, or both, apply or use them as told by your health care provider. Do not stop using the medicine even if your condition improves. Apply a warm, wet cloth (warm compress) to your eye for 5-10 minutes, 4 to 6 times a day. Clean the affected eyelid as directed by your health care provider. Do not wear contact lenses or eye makeup until your stye has healed and your health care provider says that it is safe. Do not try to pop or drain the stye. Do not rub your eye. Contact a health care provider if: You have chills or a fever. Your stye does not go away after several days. Your stye affects your vision. Your eyeball becomes swollen, red, or painful. Get help right away if: You have pain when moving your eye around. Summary A stye is a bump that forms  on an eyelid. It may look like a pimple next to the eyelash. A stye can form inside the eyelid (internal stye) or outside the eyelid (external stye). A stye can cause redness, swelling, and pain on the eyelid. Your health care provider may be able to diagnose a stye just by examining your eye. Apply a warm, wet cloth (warm compress) to your eye for 5-10 minutes, 4 to 6 times a day. This information is not intended to replace advice given to you by your health care provider. Make sure you discuss any questions you have with your health care  provider. Document Revised: 05/02/2020 Document Reviewed: 05/02/2020 Elsevier Patient Education  [place].

## 2021-05-13 ENCOUNTER — Ambulatory Visit (INDEPENDENT_AMBULATORY_CARE_PROVIDER_SITE_OTHER): Payer: Medicare Other | Admitting: Licensed Clinical Social Worker

## 2021-05-13 DIAGNOSIS — I4819 Other persistent atrial fibrillation: Secondary | ICD-10-CM

## 2021-05-13 DIAGNOSIS — R7989 Other specified abnormal findings of blood chemistry: Secondary | ICD-10-CM

## 2021-05-13 DIAGNOSIS — M5136 Other intervertebral disc degeneration, lumbar region: Secondary | ICD-10-CM

## 2021-05-13 DIAGNOSIS — I5031 Acute diastolic (congestive) heart failure: Secondary | ICD-10-CM

## 2021-05-13 DIAGNOSIS — E785 Hyperlipidemia, unspecified: Secondary | ICD-10-CM

## 2021-05-13 DIAGNOSIS — Z951 Presence of aortocoronary bypass graft: Secondary | ICD-10-CM

## 2021-05-13 DIAGNOSIS — I739 Peripheral vascular disease, unspecified: Secondary | ICD-10-CM

## 2021-05-13 DIAGNOSIS — I10 Essential (primary) hypertension: Secondary | ICD-10-CM

## 2021-05-13 NOTE — Chronic Care Management (AMB) (Signed)
Chronic Care Management    Clinical Social Work Note  05/13/2021 Name: Gabriela Smith MRN: [id number] DOB: 10/25/1940  Gabriela Smith is a 81 y.o. year old female who is a primary care patient of [name], DO. The CCM team was consulted to assist the patient with chronic disease management and/or care coordination needs related to: [employer] .   Engaged with patient by telephone for follow up visit in response to provider referral for social work chronic care management and care coordination services.   Consent to Services:  The patient was given information about Chronic Care Management services, agreed to services, and gave verbal consent prior to initiation of services.  Please see initial visit note for detailed documentation.   Patient agreed to services and consent obtained.   Assessment: Review of patient past medical history, allergies, medications, and health status, including review of relevant consultants reports was performed today as part of a comprehensive evaluation and provision of chronic care management and care coordination services.     SDOH (Social Determinants of Health) assessments and interventions performed:  SDOH Interventions    Flowsheet Row Most Recent Value  SDOH Interventions   Physical Activity Interventions Other (Comments)  [walking challenges related to neuropathy issues.]  Stress Interventions Provide Counseling  [client has stress related to managing medical needs. client has stress related to caring for the needs of her spouse.]        Advanced Directives Status: See Vynca application for related entries.  CCM Care Plan  Allergies  Allergen Reactions   Fish Allergy Hives, Shortness Of Breath and Other (See Comments)    "Bottom-feeder fish"   Fish-Derived Products Hives, Shortness Of Breath and Other (See Comments)    Bottom-feeder fish   Shellfish-Derived Products Hives and Shortness Of Breath   Lasix [Furosemide]  Swelling and Other (See Comments)    Edema, elevated BP   Macrodantin [Nitrofurantoin Macrocrystal] Other (See Comments)    Unknown reaction    Vicodin [Hydrocodone-Acetaminophen] Nausea And Vomiting and Other (See Comments)    SEVERE N/V   Zetia [Ezetimibe] Other (See Comments)    Muscle aches   Codeine Rash   Latex Itching and Rash    Reports Elastic in underwear, under wire bras, and now surgical cap cause rash and itching.    Niacin Other (See Comments)    Muscle aches   Niaspan [Niacin Er] Other (See Comments)    Muscle aches   Statins Other (See Comments)    Muscle aches/ cramps   Sulfa Antibiotics Rash and Other (See Comments)    Muscle aches/ cramps, also    Outpatient Encounter Medications as of 05/13/2021  Medication Sig   acetaminophen (TYLENOL) 500 MG tablet Take 2 tablets (1,000 mg total) by mouth every 8 (eight) hours.   amLODipine (NORVASC) 5 MG tablet TAKE 1 TABLET DAILY   apixaban (ELIQUIS) 5 MG TABS tablet TAKE 1 TABLET 2 TIMES A DAY. LABS NEEDED FOR REFILLS   aspirin EC 81 MG EC tablet Take 1 tablet (81 mg total) by mouth daily.   betamethasone valerate (VALISONE) 0.1 % cream Apply 1 application topically at bedtime. Apply externally to vaginal area   cholecalciferol (VITAMIN D) 1000 UNITS tablet Take 1,000 Units by mouth daily after breakfast.    clindamycin (CLEOCIN) 2 % vaginal cream Place 1 Applicatorful vaginally at bedtime.   Co-Enzyme Q10 200 MG CAPS Take 200 mg by mouth daily after breakfast.    digoxin (LANOXIN) 0.125 MG tablet  TAKE 1 TABLET DAILY   erythromycin ophthalmic ointment Place 1 application into the right eye at bedtime.   Flaxseed, Linseed, (FLAX SEED OIL) 1000 MG CAPS Take 1,000 mg by mouth daily after breakfast.    GNP VITAMIN B-12 500 MCG tablet TAKE 1 TABLET DAILY   levothyroxine (SYNTHROID) 50 MCG tablet TAKE 1 TABLET DAILY   Magnesium 300 MG CAPS Take 300 mg by mouth at bedtime.    metoprolol tartrate (LOPRESSOR) 100 MG tablet Take 0.5  tablets (50 mg total) by mouth 2 (two) times daily.   nitroGLYCERIN (NITROSTAT) 0.4 MG SL tablet PLACE 1 TABLET UNDER THE TONGUE AT ONSET OF CHEST PAIN EVERY 5 MINTUES UP TO 3 TIMES AS NEEDED   REPATHA SURECLICK 119 MG/ML SOAJ INJECT '140MG'$  (1ML) EVERY 2 WEEKS   Rimegepant Sulfate (NURTEC) 75 MG TBDP Dissolve 1 tablet in mouth ONCE per day if needed for headache   TURMERIC PO Take 1 capsule by mouth daily after breakfast.   Facility-Administered Encounter Medications as of 05/13/2021  Medication   DOBUTamine (DOBUTREX) 1,000 mcg/mL in dextrose 5% 250 mL infusion    Patient Active Problem List   Diagnosis Date Noted   Persistent atrial fibrillation ([place]) 07/03/2020   Edema 07/03/2020   Paroxysmal A-fib ([place]) 08/12/2019   S/P CABG x 4 04/26/2019   Acute CHF (congestive heart failure) ([place]) 04/15/2019   Disease related peripheral neuropathy 03/07/2019   BMI 33.0-33.9,adult 02/17/2019   Intermittent claudication ([place]) 02/17/2019   Peripheral vascular insufficiency ([place]) 02/17/2019   Aortic atherosclerosis ([place]) 02/11/2017   Atrophic vaginitis 09/18/2016   S/P right THA, AA 12/25/2015   S/P hip replacement 12/25/2015   AAA (abdominal aortic aneurysm) without rupture 08/24/2015   Statin intolerance 05/23/2013   Osteopenia of right forearm 04/20/2013   Degenerative disc disease, lumbar 02/08/2013   Vitamin D deficiency 02/08/2013   Overweight (BMI 25.0-29.9) 11/24/2012   S/P left TKA 11/22/2012   Malignant neoplasm of urinary bladder ([place]) 11/04/2012   Osteoarthritis of left knee 11/04/2012   Hyperlipidemia LDL goal <70 11/28/2008   Hypertension 11/28/2008   CORONARY ATHEROSCLEROSIS NATIVE CORONARY ARTERY 11/28/2008    Conditions to be addressed/monitored: monitor client management of medical needs  Care Plan : LCSW Care plan  Updates made by Katha Cabal, LCSW since 05/13/2021 12:00 AM     Problem: Coping Skills (General Plan of Care)      Goal: Coping Skills  Enhanced;Manage Medical Needs   Start Date: 05/13/2021  Expected End Date: 08/12/2021  This Visit's Progress: On track  Recent Progress: On track  Priority: Medium  Note:   Current barriers:   Patient in need of assistance with connecting to community resources for possible help in managing medical needs Patient is unable to independently navigate community resource options without care coordination support Mobility issues Some pain issues in her feet  Clinical Goals:  patient will work with SW in next 30 days to address concerns related to client management of ongoing medical needs Client to call RNCM or LCSW as needed in next 30 days for CCM support Patient will communicate with SW in next 30 days to discuss mobility of client and pain issues of client   Clinical Interventions:  Collaboration with [name], DO regarding development and update of comprehensive plan of care as evidenced by provider attestation and co-signature Discussed client needs related to hearing. She said she wears hearing aids and functions well with use of hearing aids Discussed with client the mood  status of client. She said she is doing well with her mood. She said she is usually in a positive mood. She and her spouse live in apartment beside home of her daughter.  Thus, her daughter is nearby if she or her spouse have any particular needs. Client feels that her mood is stable at this time Reviewed health needs of her spouse. She said her spouse has diagnosis of Parkinson's Disease. She said that she and her spouse are managing his health needs well at present, with help of their daughter.  Discussed family support. Client has support from her daughter who lives next door to client Reviewed her support from cardiologist.  Client plans to call nurse with cardiologist office and determine how soon client needs to have appointment with cardiologist Encouraged client to call RNCM as needed for CCM nursing  support Encouraged client to call LCSW as needed for SW support Discussed mobility of client. Discussed pain issues of client (she spoke of chronic pain in her feet) .  She spoke of challenges in dealing with neuropathy  Patient Strengths/Coping Skills: Attends scheduled medical appointments Takes medications as prescribed Has support from daughter, Neita Goodnight  Patient Defictis Mobility issues Some difficulties in completing ADLs Some Pain issues  Patient Goals:  Patient to attend scheduled medical appointments in next 30 days Patient to talk regularly with her daughter, Amy, in next 30 days about needs of client Client to call RNCM or LCSW as needed in next 30 days for CCM support  Follow Up Plan: LCSW to call client on 07/04/21 at 2:00 PM to assess needs of client      [name].Ellawyn Wogan MSW, [place] [employer] representative [facility] Care Management [phone]

## 2021-05-13 NOTE — Patient Instructions (Addendum)
Visit Information ? ?Patient goals:  Protect My Health (Patient). Manage Medical needs ? ?Timeframe:  Short-Term Goal ?Priority:  Medium ?Progress: On Track ?Start Date:             05/13/21              ?Expected End Date:           08/12/21         ? ?Follow Up Date 07/04/21 at 2:00 PM ?  ?Protect My Health (Patient)  Manage Medical needs  ?  ?Why is this important?   ?Screening tests can find diseases early when they are easier to treat.  ?Your doctor or nurse will talk with you about which tests are important for you.  ?Getting shots for common diseases like the flu and shingles will help prevent them.    ? ?Patient Strengths/Coping Skills: ?Attends scheduled medical appointments ?Takes medications as prescribed ?Has support from daughter, Neita Goodnight ? ?Patient Defictis ?Mobility issues ?Some difficulties in completing ADLs ?Some Pain issues ? ?Patient Goals: ? ?Patient to attend scheduled medical appointments in next 30 days ?Patient to talk regularly with her daughter, Amy, in next 30 days about needs of client ?Client to call RNCM or LCSW as needed in next 30 days for CCM support ? ?Follow Up Plan: LCSW to call client on 07/04/21 at 2:00 PM to assess client needs  ? ?[name].Kevyn Wengert MSW, LCSW ?Licensed Clinical Social Worker ?[place] [facility] ?[phone] ?

## 2021-06-06 ENCOUNTER — Other Ambulatory Visit: Payer: Self-pay | Admitting: Cardiology

## 2021-07-04 ENCOUNTER — Telehealth: Payer: Medicare Other

## 2021-07-05 ENCOUNTER — Telehealth: Payer: Self-pay | Admitting: Family Medicine

## 2021-07-05 NOTE — Telephone Encounter (Signed)
?  Prescription Request ? ?07/05/2021 ? ?Is this a "Controlled Substance" medicine? yes ? ?Have you seen your PCP in the last 2 weeks? No, made appt for 5/18 ? ?If YES, route message to pool  -  If NO, patient needs to be scheduled for appointment. ? ?What is the name of the medication or equipment? Tramadol ? ?Have you contacted your pharmacy to request a refill? yes  ? ?Which pharmacy would you like this sent to? [facility]  ? ? ?Patient notified that their request is being sent to the clinical staff for review and that they should receive a response within 2 business days.  ?  ?

## 2021-07-05 NOTE — Telephone Encounter (Signed)
Patient finished her antibiotic for her yeast infection and it is not getting better. She would like to know if something else can be called in for this. Please send to [facility].  ?

## 2021-07-05 NOTE — Telephone Encounter (Signed)
LMTCB, will address in other telephone encounter. Closing this encounter. ?

## 2021-07-05 NOTE — Telephone Encounter (Signed)
LMTCB ?Pt called in with 2 questions, copied information from other call: ?Patient finished her antibiotic for her yeast infection and it is not getting better. She would like to know if something else can be called in for this. Please send to [facility].  ? ?Pt has not been seen since January, in researching chart, our practice has not prescribed any medications recently and Tramadol is not on her current med list. In looking at the [facility] she has not had any filled since 05/02/19 for a 5 day supply by a Erin Barrett, PA-C. ? ?I also called [facility] who also confirmed that pt has not had any medications filled in the last month, I was thinking that she may have gotten something prescribed by another provider. ?

## 2021-07-08 ENCOUNTER — Other Ambulatory Visit: Payer: Self-pay | Admitting: Cardiology

## 2021-07-08 DIAGNOSIS — I480 Paroxysmal atrial fibrillation: Secondary | ICD-10-CM

## 2021-07-08 NOTE — Telephone Encounter (Signed)
Prescription refill request for Eliquis received. ?Indication:Afib ?Last office visit:Needs appointment ?Scr:0.8 ?Age: 81 ?Weight:81.4 kg ? ?Prescription refilled ? ?

## 2021-07-12 NOTE — Telephone Encounter (Signed)
?*  STAT* If patient is at the pharmacy, call can be transferred to refill team. ? ? ?1. Which medications need to be refilled? (please list name of each medication and dose if known) metoprolol tartrate (LOPRESSOR) 50 MG tablet ? ?2. Which pharmacy/location (including street and city if local pharmacy) is medication to be sent to? [place], [place] ? ?3. Do they need a 30 day or 90 day supply? 90 day ? ?  ?

## 2021-07-19 ENCOUNTER — Encounter: Payer: Self-pay | Admitting: Nurse Practitioner

## 2021-07-19 ENCOUNTER — Ambulatory Visit (INDEPENDENT_AMBULATORY_CARE_PROVIDER_SITE_OTHER): Payer: Medicare Other | Admitting: Nurse Practitioner

## 2021-07-19 VITALS — BP 151/94 | HR 77 | Temp 98.7°F | Ht 64.0 in | Wt 175.0 lb

## 2021-07-19 DIAGNOSIS — W57XXXA Bitten or stung by nonvenomous insect and other nonvenomous arthropods, initial encounter: Secondary | ICD-10-CM | POA: Diagnosis not present

## 2021-07-19 DIAGNOSIS — S20462A Insect bite (nonvenomous) of left back wall of thorax, initial encounter: Secondary | ICD-10-CM | POA: Diagnosis not present

## 2021-07-19 DIAGNOSIS — L089 Local infection of the skin and subcutaneous tissue, unspecified: Secondary | ICD-10-CM | POA: Diagnosis not present

## 2021-07-19 MED ORDER — HYDROCORTISONE 1 % EX LOTN: 1.0000 "application " | TOPICAL_LOTION | Freq: Two times a day (BID) | CUTANEOUS | 0 refills | Status: AC

## 2021-07-19 MED ORDER — CEPHALEXIN 500 MG PO CAPS: 500.0000 mg | ORAL_CAPSULE | Freq: Two times a day (BID) | ORAL | 0 refills | Status: DC

## 2021-07-19 NOTE — Progress Notes (Signed)
? ?Acute Office Visit ? ?Subjective:  ? ?  ?Patient ID: Gabriela Smith, female    DOB: April 09, 1940, 81 y.o.   MRN: [id number] ? ?Chief Complaint  ?Patient presents with  ? skin check  ?  Pt states this has been on going for about a month now   ? ? ?HPI ?Patient is in today for insect bite.  Symptoms present in the last 24 hours and worsening with increased erythema, tenderness and swelling.  Patient not aware of what type of insect but reports they have several spiders around the home.  No fever, nausea headache or flulike symptoms associated with current complaint. ? ?Review of Systems  ?Constitutional: Negative.  Negative for chills and fever.  ?HENT: Negative.    ?Eyes: Negative.   ?Respiratory: Negative.    ?Cardiovascular: Negative.   ?Skin:  Positive for itching and rash.  ?Neurological: Negative.   ?Endo/Heme/Allergies: Negative.   ?All other systems reviewed and are negative. ? ? ?   ?Objective:  ?  ?BP (!) 151/94   Pulse 77   Temp 98.7 ?F (37.1 ?C)   Ht '5\' 4"'$  (1.626 m)   Wt 175 lb (79.4 kg)   SpO2 93%   BMI 30.04 kg/m?  ?BP Readings from Last 3 Encounters:  ?07/19/21 (!) 151/94  ?01/09/21 (!) 142/88  ?12/05/20 131/72  ? ?Wt Readings from Last 3 Encounters:  ?07/19/21 175 lb (79.4 kg)  ?01/09/21 179 lb 6.4 oz (81.4 kg)  ?12/05/20 175 lb (79.4 kg)  ? ?  ? ?Physical Exam ?Vitals and nursing note reviewed.  ?Constitutional:   ?   Appearance: Normal appearance.  ?HENT:  ?   Head: Normocephalic.  ?   Right Ear: External ear normal.  ?   Left Ear: External ear normal.  ?   Mouth/Throat:  ?   Mouth: Mucous membranes are moist.  ?   Pharynx: Oropharynx is clear.  ?Eyes:  ?   Conjunctiva/sclera: Conjunctivae normal.  ?Cardiovascular:  ?   Rate and Rhythm: Normal rate and regular rhythm.  ?   Pulses: Normal pulses.  ?   Heart sounds: Normal heart sounds.  ?Pulmonary:  ?   Effort: Pulmonary effort is normal.  ?   Breath sounds: Normal breath sounds.  ?Abdominal:  ?   General: Bowel sounds are normal.   ?Musculoskeletal:     ?   General: Normal range of motion.  ?Skin: ?   Findings: Erythema and rash present.  ?Neurological:  ?   General: No focal deficit present.  ?   Mental Status: She is alert and oriented to person, place, and time.  ? ? ?No results found for any visits on 07/19/21. ? ? ?   ?Assessment & Plan:  ?Insect bite not well controlled, topical hydrocortisone 1% cream, Keflex 500 mg tablet twice daily.  Avoid scratching skin, cool compress, Tylenol/ibuprofen for pain and fever as needed.  Follow-up with worsening hours of symptoms. ?Problem List Items Addressed This Visit   ?None ?Visit Diagnoses   ? ? Insect bite of left side of back with infection    -  Primary  ? Relevant Medications  ? hydrocortisone 1 % lotion  ? cephALEXin (KEFLEX) 500 MG capsule  ? ?  ? ? ?Meds ordered this encounter  ?Medications  ? hydrocortisone 1 % lotion  ?  Sig: Apply 1 application. topically 2 (two) times daily.  ?  Dispense:  118 mL  ?  Refill:  0  ?  Order Specific  Question:   Supervising Provider  ?  AnswerClaretta Fraise [[id number]]  ? cephALEXin (KEFLEX) 500 MG capsule  ?  Sig: Take 1 capsule (500 mg total) by mouth 2 (two) times daily.  ?  Dispense:  14 capsule  ?  Refill:  0  ?  Order Specific Question:   Supervising Provider  ?  AnswerClaretta Fraise [[id number]]  ? ? ?Return if symptoms worsen or fail to improve. ? ?Ivy Lynn, NP ? ? ?

## 2021-07-19 NOTE — Patient Instructions (Signed)
Insect Bite, Adult An insect bite can make your skin red, itchy, and swollen. Some insects can spread disease to people with a bite. However, most insect bites do not lead to disease, and most are not serious. What are the causes? Insects may bite for many reasons, including: Hunger. To defend themselves. Insects that bite include: Spiders. Mosquitoes. Ticks. Fleas. Ants. Flies. Kissing bugs. Chiggers. What are the signs or symptoms? Symptoms of this condition include: Itching or pain in the bite area. Redness and swelling in the bite area. An open wound (skin ulcer). Symptoms often last for 2-4 days. In rare cases, a person may have a very bad allergic reaction (anaphylactic reaction) to a bite. Symptoms of an anaphylactic reaction may include: Feeling warm in the face (flushed). Your face may turn red. Itchy, red, swollen areas of skin (hives). Swelling of the: Eyes. Lips. Face. Mouth. Tongue. Throat. Trouble with any of these: Breathing. Talking. Swallowing. Loud breathing (wheezing). Feeling dizzy or light-headed. Passing out (fainting). Pain or cramps in your belly. Throwing up (vomiting). Watery poop (diarrhea). How is this treated? Treatment is usually not needed. Symptoms often go away on their own. When treatment is needed, it may involve: Putting a cream or lotion on the bite area. This helps with itching. Taking an antibiotic medicine. This treatment is needed if the bite area gets infected. Getting a tetanus shot, if you are not up to date on this vaccine. Putting ice on the affected area. Using medicines called antihistamines. This treatment may be needed if you have itching or an allergic reaction to the insect bite. Giving yourself a shot of medicine (epinephrine) using an auto-injector "pen" if you have an anaphylactic reaction to a bite. Your doctor will teach you how to use this pen. Follow these instructions at home: Bite area care  Do not  scratch the bite area. Keep the bite area clean and dry. Wash the bite area every day with soap and water as told by your doctor. Check the bite area every day for signs of infection. Check for: Redness, swelling, or pain. Fluid or blood. Warmth. Pus or a bad smell. Managing pain, itching, and swelling  You may put any of these on the bite area as told by your doctor: A paste made of baking soda and water. Cortisone cream. Calamine lotion. If told, put ice on the bite area. Put ice in a plastic bag. Place a towel between your skin and the bag. Leave the ice on for 20 minutes, 2-3 times a day. General instructions Apply or take over-the-counter and prescription medicines only as told by your doctor. If you were prescribed an antibiotic medicine, take or apply it as told by your doctor. Do not stop using the antibiotic even if your condition improves. Keep all follow-up visits as told by your doctor. This is important. How is this prevented? To help you have a lower risk of insect bites: When you are outside, wear clothing that covers your arms and legs. Use insect repellent. The best insect repellents contain one of these: DEET. Picaridin. Oil of lemon eucalyptus (OLE). IR3535. Consider spraying your clothing with a pesticide called permethrin. Permethrin helps prevent insect bites. It works for several weeks and for up to 5-6 clothing washes. Do not apply permethrin directly to the skin. If your home windows do not have screens, think about putting some in. If you will be sleeping in an area where there are mosquitoes, consider covering your sleeping area with a mosquito   net. Contact a doctor if: You have redness, swelling, or pain in the bite area. You have fluid or blood coming from the bite area. The bite area feels warm to the touch. You have pus or a bad smell coming from the bite area. You have a fever. Get help right away if: You have joint pain. You have a rash. You  feel more tired or sleepy than you normally do. You have neck pain. You have a headache. You feel weaker than you normally do. You have signs of an anaphylactic reaction. Signs may include: Feeling warm in the face. Itchy, red, swollen areas of skin. Swelling of your: Eyes. Lips. Face. Mouth. Tongue. Throat. Trouble with any of these: Breathing. Talking. Swallowing. Loud breathing. Feeling dizzy or light-headed. Passing out. Pain or cramps in your belly. Throwing up. Watery poop. These symptoms may be an emergency. Do not wait to see if the symptoms will go away. Do this right away: Use your auto-injector pen as you have been told. Get medical help. Call your local emergency services (911 in the [place] Do not drive yourself to the hospital. Summary An insect bite can make your skin red, itchy, and swollen. Treatment is usually not needed. Symptoms often go away on their own. Do not scratch the bite area. Keep it clean and dry. Ice can help with pain and itching from the bite. This information is not intended to replace advice given to you by your health care provider. Make sure you discuss any questions you have with your health care provider. Document Revised: 11/26/2020 Document Reviewed: 11/26/2020 Elsevier Patient Education  2023 Elsevier Inc.  

## 2021-07-22 ENCOUNTER — Other Ambulatory Visit: Payer: Self-pay

## 2021-07-22 ENCOUNTER — Telehealth: Payer: Self-pay | Admitting: Cardiology

## 2021-07-22 MED ORDER — METOPROLOL TARTRATE 100 MG PO TABS: 50.0000 mg | ORAL_TABLET | Freq: Two times a day (BID) | ORAL | 0 refills | Status: DC

## 2021-07-22 NOTE — Telephone Encounter (Signed)
Medication sent to desired pharmacy. Called pt and explained to her that I sent 30 day supply and she can get her 90 day supply once she attends her upcoming appointment. Patient understood.  ?

## 2021-07-22 NOTE — Telephone Encounter (Signed)
?*  STAT* If patient is at the pharmacy, call can be transferred to refill team. ? ? ?1. Which medications need to be refilled? (please list name of each medication and dose if known) new prescription for Metoprolol ? ?2. Which pharmacy/location (including street and city if local pharmacy) is medication to be sent to?[address], [place] ? ?3. Do they need a 30 day or 90 day supply? 90 days and refills- ? ?

## 2021-07-25 ENCOUNTER — Telehealth: Payer: Self-pay | Admitting: Cardiology

## 2021-07-25 ENCOUNTER — Other Ambulatory Visit: Payer: Self-pay | Admitting: Cardiology

## 2021-07-25 ENCOUNTER — Ambulatory Visit (INDEPENDENT_AMBULATORY_CARE_PROVIDER_SITE_OTHER): Payer: Medicare Other | Admitting: Family Medicine

## 2021-07-25 ENCOUNTER — Encounter: Payer: Self-pay | Admitting: Family Medicine

## 2021-07-25 VITALS — BP 114/66 | HR 119 | Temp 98.2°F | Ht 64.0 in | Wt 179.2 lb

## 2021-07-25 DIAGNOSIS — E039 Hypothyroidism, unspecified: Secondary | ICD-10-CM

## 2021-07-25 DIAGNOSIS — I700 Atherosclerosis of aorta: Secondary | ICD-10-CM

## 2021-07-25 DIAGNOSIS — I480 Paroxysmal atrial fibrillation: Secondary | ICD-10-CM

## 2021-07-25 MED ORDER — AMLODIPINE BESYLATE 5 MG PO TABS: 5.0000 mg | ORAL_TABLET | Freq: Every day | ORAL | 3 refills | Status: DC

## 2021-07-25 MED ORDER — APIXABAN 5 MG PO TABS: 5.0000 mg | ORAL_TABLET | Freq: Two times a day (BID) | ORAL | 3 refills | Status: DC

## 2021-07-25 MED ORDER — METOPROLOL TARTRATE 100 MG PO TABS: 50.0000 mg | ORAL_TABLET | Freq: Two times a day (BID) | ORAL | 3 refills | Status: DC

## 2021-07-25 NOTE — Patient Instructions (Signed)
Make appointment with Dr Percival Spanish

## 2021-07-25 NOTE — Telephone Encounter (Signed)
*  STAT* If patient is at the pharmacy, call can be transferred to refill team.   1. Which medications need to be refilled? (please list name of each medication and dose if known)  metoprolol tartrate (LOPRESSOR) 100 MG tablet  2. Which pharmacy/location (including street and city if local pharmacy) is medication to be sent to? [place], [place]  3. Do they need a 30 day or 90 day supply?   30 day supply  Per [facility] with [facility], quantity was incorrect on previous Rx. Please correct.

## 2021-07-25 NOTE — Progress Notes (Signed)
Subjective: [id number] follow up PCP: Gabriela Norlander, DO PYP:PJKD T [name] is a 81 y.o. female presenting to clinic today for:  1.  AFib w/ HTN and Ao Atherosclerosis Patient is overdue for an appoint with her cardiologist.  Apparently she has not been able to get a couple of her medications and she is here today to see if she might get some refills.  She thought that the refill appointment was supposed to be for here.  2. Hypothyroidism Compliant with thyroid medications.  She did have a little run of atrial fibrillation yesterday that was symptomatic.  Denies any chest pain or shortness of breath however.  No bleeding with the Eliquis.  She reports she relatively feels well and tries to watch her diet to maintain good health   ROS: Per HPI  Allergies  Allergen Reactions   Fish Allergy Hives, Shortness Of Breath and Other (See Comments)    "Bottom-feeder fish"   Fish-Derived Products Hives, Shortness Of Breath and Other (See Comments)    Bottom-feeder fish   Shellfish-Derived Products Hives and Shortness Of Breath   Lasix [Furosemide] Swelling and Other (See Comments)    Edema, elevated BP   Macrodantin [Nitrofurantoin Macrocrystal] Other (See Comments)    Unknown reaction    Vicodin [Hydrocodone-Acetaminophen] Nausea And Vomiting and Other (See Comments)    SEVERE N/V   Zetia [Ezetimibe] Other (See Comments)    Muscle aches   Codeine Rash   Latex Itching and Rash    Reports Elastic in underwear, under wire bras, and now surgical cap cause rash and itching.    Niacin Other (See Comments)    Muscle aches   Niaspan [Niacin Er] Other (See Comments)    Muscle aches   Statins Other (See Comments)    Muscle aches/ cramps   Sulfa Antibiotics Rash and Other (See Comments)    Muscle aches/ cramps, also   Past Medical History:  Diagnosis Date   AAA (abdominal aortic aneurysm) [facility])    cardiology aware    Arthritis    OA lt knee- cortizone inj. q 4 months AND OA ALSO IN  BACK. Had knee replaced-issue resolved   CAD (coronary artery disease) CARDIOLOGIST- [facility]--  VISIT 01-02-10 IN EPIC   1993-- PTCA. Pt describes a near total blockage of apparently the LAD and a 65% stenosis elsewhere.   Cancer [facility])    History of bladder cancer followed by dr Karsten Ro   hx  TCC of bladder ,  Ta G3-  first occurence 02-20-2012--  s/p BCG tx's  and  mitomycin C   Hyperlipidemia 1993   Hypertension 1993   Nocturia    Osteopenia    Sigmoid diverticulosis     Current Outpatient Medications:    acetaminophen (TYLENOL) 500 MG tablet, Take 2 tablets (1,000 mg total) by mouth every 8 (eight) hours., Disp: 30 tablet, Rfl: 0   amLODipine (NORVASC) 5 MG tablet, TAKE 1 TABLET DAILY, Disp: 90 tablet, Rfl: 3   apixaban (ELIQUIS) 5 MG TABS tablet, Take 1 tablet (5 mg total) by mouth 2 (two) times daily. NEEDS CARDIOLOGY APPOINTMENT, CALL OFFICE, Disp: 60 tablet, Rfl: 0   aspirin EC 81 MG EC tablet, Take 1 tablet (81 mg total) by mouth daily., Disp:  , Rfl:    betamethasone valerate (VALISONE) 0.1 % cream, Apply 1 application topically at bedtime. Apply externally to vaginal area, Disp: 45 g, Rfl: 1   cephALEXin (KEFLEX) 500 MG capsule, Take 1 capsule (500 mg total)  by mouth 2 (two) times daily., Disp: 14 capsule, Rfl: 0   cholecalciferol (VITAMIN D) 1000 UNITS tablet, Take 1,000 Units by mouth daily after breakfast. , Disp: , Rfl:    clindamycin (CLEOCIN) 2 % vaginal cream, Place 1 Applicatorful vaginally at bedtime., Disp: 40 g, Rfl: 2   Co-Enzyme Q10 200 MG CAPS, Take 200 mg by mouth daily after breakfast. , Disp: , Rfl:    digoxin (LANOXIN) 0.125 MG tablet, TAKE 1 TABLET DAILY, Disp: 90 tablet, Rfl: 1   Flaxseed, Linseed, (FLAX SEED OIL) 1000 MG CAPS, Take 1,000 mg by mouth daily after breakfast. , Disp: , Rfl:    GNP VITAMIN B-12 500 MCG tablet, TAKE 1 TABLET DAILY, Disp: 90 tablet, Rfl: 0   hydrocortisone 1 % lotion, Apply 1 application. topically 2 (two) times daily., Disp: 118  mL, Rfl: 0   levothyroxine (SYNTHROID) 50 MCG tablet, TAKE 1 TABLET DAILY, Disp: 90 tablet, Rfl: 2   Magnesium 300 MG CAPS, Take 300 mg by mouth at bedtime. , Disp: , Rfl:    metoprolol tartrate (LOPRESSOR) 100 MG tablet, Take 0.5 tablets (50 mg total) by mouth 2 (two) times daily. PATIENT MUST SCHEDULE APPOINTMENT FOR FUTURE REFILLS. FIRST ATTEMPT, Disp: 30 tablet, Rfl: 0   nitroGLYCERIN (NITROSTAT) 0.4 MG SL tablet, PLACE 1 TABLET UNDER THE TONGUE AT ONSET OF CHEST PAIN EVERY 5 MINTUES UP TO 3 TIMES AS NEEDED, Disp: 25 tablet, Rfl: 0   REPATHA SURECLICK 888 MG/ML SOAJ, INJECT 140MG (1ML) EVERY 2 WEEKS, Disp: 2 mL, Rfl: 11   Rimegepant Sulfate (NURTEC) 75 MG TBDP, Dissolve 1 tablet in mouth ONCE per day if needed for headache, Disp: 2 tablet, Rfl: 0   TURMERIC PO, Take 1 capsule by mouth daily after breakfast., Disp: , Rfl:  No current facility-administered medications for this visit.  Facility-Administered Medications Ordered in Other Visits:    DOBUTamine (DOBUTREX) 1,000 mcg/mL in dextrose 5% 250 mL infusion, 20 mcg/kg/min, Intravenous, Continuous, Dorothy Spark, MD, Last Rate: 96.4 mL/hr at 12/24/15 0840, 20 mcg/kg/min at 12/24/15 0840 Social History   Socioeconomic History   Marital status: Married    Spouse name: Jeneen Rinks   Number of children: 3   Years of education: 12   Highest education level: High school graduate  Occupational History   Occupation: retired  Tobacco Use   Smoking status: Former    Packs/day: 0.50    Years: 5.00    Pack years: 2.50    Types: Cigarettes    Quit date: 03/09/1976    Years since quitting: 45.4   Smokeless tobacco: Never  Vaping Use   Vaping Use: Never used  Substance and Sexual Activity   Alcohol use: Yes    Alcohol/week: 4.0 standard drinks    Types: 4 Glasses of wine per week    Comment: red wine   Drug use: No   Sexual activity: Not Currently  Other Topics Concern   Not on file  Social History Narrative   Married with 3 children  and 4 grandchildren.  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 with Parkinsons   Lives in garage apartment at her daughters home.   Social Determinants of Health   Financial Resource Strain: Low Risk    Difficulty of Paying Living Expenses: Not hard at all  Food Insecurity: No Food Insecurity   Worried About Charity fundraiser in the Last Year: Never true   Ran Out of Food in the Last Year: Never true  Transportation Needs: No Transportation  Needs   Lack of Transportation (Medical): No   Lack of Transportation (Non-Medical): No  Physical Activity: Inactive   Days of Exercise per Week: 0 days   Minutes of Exercise per Session: 0 min  Stress: Stress Concern Present   Feeling of Stress : To some extent  Social Connections: Engineer, building services of Communication with Friends and Family: More than three times a week   Frequency of Social Gatherings with Friends and Family: More than three times a week   Attends Religious Services: 1 to 4 times per year   Active Member of [employer] or Organizations: Yes   Attends Music therapist: More than 4 times per year   Marital Status: Married  Human resources officer Violence: Not At Risk   Fear of Current or Ex-Partner: No   Emotionally Abused: No   Physically Abused: No   Sexually Abused: No   Family History  Problem Relation Age of Onset   Coronary artery disease Father 27   Hyperlipidemia Father    Heart attack Mother 15   Hyperlipidemia Mother    Osteoporosis Sister    Hyperlipidemia Sister    Heart attack Daughter 68       CABG x 5   Heart disease Son 66       Stents x 3    Objective: Office vital signs reviewed. BP 114/66   Pulse (!) 119   Temp 98.2 F (36.8 C)   Ht '5\' 4"'  (1.626 m)   Wt 179 lb 3.2 oz (81.3 kg)   SpO2 94%   BMI 30.76 kg/m   Physical Examination:  General: Awake, alert, well nourished, No acute distress HEENT: Sclera white.  No exophthalmos.  No goiter.  Moist mucous membranes. Cardio: Irregularly irregular  with slightly increased rate, S1S2 heard, no murmurs appreciated Pulm: clear to auscultation bilaterally, no wheezes, rhonchi or rales; normal work of breathing on room air  Assessment/ Plan: 81 y.o. female   Acquired hypothyroidism - Plan: TSH, T4, Free, CMP14+EGFR  Aortic atherosclerosis (HCC) - Plan: CBC, CMP14+EGFR  Paroxysmal atrial fibrillation (HCC) - Plan: CBC, Magnesium, Digoxin level, CMP14+EGFR, amLODipine (NORVASC) 5 MG tablet, apixaban (ELIQUIS) 5 MG TABS tablet, metoprolol tartrate (LOPRESSOR) 100 MG tablet  Check thyroid levels, CMP, CBC, mag and dig levels.  She is slightly tachycardic here in office.  I am going to CC Dr. Percival Spanish as Juluis Rainier.  Her appointment with him is not until the end of June.  I renewed her medications in the interim.  Thinks she may have been out of her beta-blocker and this may be causing some of her symptoms  No orders of the defined types were placed in this encounter.  No orders of the defined types were placed in this encounter.    Gabriela Norlander, DO [place] [phone]

## 2021-07-26 LAB — CBC
Hematocrit: 39.1 % (ref 34.0–46.6)
Hemoglobin: 12.9 g/dL (ref 11.1–15.9)
MCH: 31.7 pg (ref 26.6–33.0)
MCHC: 33 g/dL (ref 31.5–35.7)
MCV: 96 fL (ref 79–97)
Platelets: 274 10*3/uL (ref 150–450)
RBC: 4.07 x10E6/uL (ref 3.77–5.28)
RDW: 13 % (ref 11.7–15.4)
WBC: 7.4 10*3/uL (ref 3.4–10.8)

## 2021-07-26 LAB — CMP14+EGFR
ALT: 17 IU/L (ref 0–32)
AST: 23 IU/L (ref 0–40)
Albumin/Globulin Ratio: 1.7 (ref 1.2–2.2)
Albumin: 4.5 g/dL (ref 3.6–4.6)
Alkaline Phosphatase: 86 IU/L (ref 44–121)
BUN/Creatinine Ratio: 14 (ref 12–28)
BUN: 14 mg/dL (ref 8–27)
Bilirubin Total: 0.3 mg/dL (ref 0.0–1.2)
CO2: 22 mmol/L (ref 20–29)
Calcium: 9.5 mg/dL (ref 8.7–10.3)
Chloride: 105 mmol/L (ref 96–106)
Creatinine, Ser: 0.98 mg/dL (ref 0.57–1.00)
Globulin, Total: 2.7 g/dL (ref 1.5–4.5)
Glucose: 125 mg/dL — ABNORMAL HIGH (ref 70–99)
Potassium: 4.3 mmol/L (ref 3.5–5.2)
Sodium: 142 mmol/L (ref 134–144)
Total Protein: 7.2 g/dL (ref 6.0–8.5)
eGFR: 58 mL/min/{1.73_m2} — ABNORMAL LOW (ref >59)

## 2021-07-26 LAB — DIGOXIN LEVEL: Digoxin, Serum: 0.7 ng/mL (ref 0.5–0.9)

## 2021-07-26 LAB — T4, FREE: Free T4: 1.36 ng/dL (ref 0.82–1.77)

## 2021-07-26 LAB — TSH: TSH: 2.04 u[IU]/mL (ref 0.450–4.500)

## 2021-07-26 LAB — MAGNESIUM: Magnesium: 2.5 mg/dL — ABNORMAL HIGH (ref 1.6–2.3)

## 2021-07-26 MED ORDER — METOPROLOL TARTRATE 50 MG PO TABS: ORAL_TABLET | ORAL | 0 refills | Status: DC

## 2021-07-26 NOTE — Telephone Encounter (Signed)
I spoke with [place]. Patient had only been given a 10 day supply of Lopressor. She needs enough to get her to her follow up appointment on 09/04/21 with Dr.Hochrein.I verified with pharmacist her dose is 75 mg bid. PCP had written a script for 50 mg bid but was not sent to pharmacy.

## 2021-07-29 ENCOUNTER — Telehealth: Payer: Self-pay | Admitting: Cardiology

## 2021-07-29 NOTE — Telephone Encounter (Signed)
Daughter stated patient has not been taking medication as directed for the past few days. She couldn't find it, so daughter shared her lopressor 75 mg bid. Refill for 90 tablets was sent on 5/19 (awaiting for June appointment for more refills). BP has been 163/105 to 176/108. Please advise.

## 2021-07-29 NOTE — Telephone Encounter (Signed)
Pt c/o BP issue: STAT if pt c/o blurred vision, one-sided weakness or slurred speech  1. What are your last 5 BP readings?  163/105 HR over 100  2. Are you having any other symptoms (ex. Dizziness, headache, blurred vision, passed out)? Seems to have shakiness    3. What is your BP issue? Pt's daughter is req call back in regards to her mothers BP. States it has been high the last 3 to 4 daughter and says it'll come down, but this is how it is in the morning.

## 2021-07-29 NOTE — Telephone Encounter (Signed)
*  STAT* If patient is at the pharmacy, call can be transferred to refill team.   1. Which medications need to be refilled? (please list name of each medication and dose if known) metoprolol tartrate (LOPRESSOR) 50 MG tablet  2. Which pharmacy/location (including street and city if local pharmacy) is medication to be sent to? [place], [place]  3. Do they need a 30 day or 90 day supply?  Pt's daughter says that pt does not have enough to last until her next appt and is requesting enough to be sent until then.

## 2021-07-31 MED ORDER — METOPROLOL TARTRATE 50 MG PO TABS: ORAL_TABLET | ORAL | 2 refills | Status: DC

## 2021-07-31 NOTE — Telephone Encounter (Signed)
LMTCB

## 2021-07-31 NOTE — Telephone Encounter (Signed)
Daughter informed to have patient continue to take blood pressure medications and have patient check her blood pressure twice a day. Daughter will call with blood pressure results this week.

## 2021-08-01 ENCOUNTER — Other Ambulatory Visit: Payer: Self-pay | Admitting: Internal Medicine

## 2021-08-02 ENCOUNTER — Ambulatory Visit: Payer: Medicare Other | Admitting: Cardiology

## 2021-08-07 DIAGNOSIS — H5203 Hypermetropia, bilateral: Secondary | ICD-10-CM | POA: Diagnosis not present

## 2021-08-07 DIAGNOSIS — H25013 Cortical age-related cataract, bilateral: Secondary | ICD-10-CM | POA: Diagnosis not present

## 2021-08-07 DIAGNOSIS — H2513 Age-related nuclear cataract, bilateral: Secondary | ICD-10-CM | POA: Diagnosis not present

## 2021-08-29 ENCOUNTER — Encounter: Payer: Self-pay | Admitting: Family Medicine

## 2021-08-29 ENCOUNTER — Telehealth: Payer: Self-pay | Admitting: Family Medicine

## 2021-08-29 ENCOUNTER — Ambulatory Visit (INDEPENDENT_AMBULATORY_CARE_PROVIDER_SITE_OTHER): Payer: Medicare Other | Admitting: Family Medicine

## 2021-08-29 DIAGNOSIS — N898 Other specified noninflammatory disorders of vagina: Secondary | ICD-10-CM

## 2021-08-29 MED ORDER — FLUCONAZOLE 150 MG PO TABS: 150.0000 mg | ORAL_TABLET | Freq: Once | ORAL | 0 refills | Status: AC

## 2021-09-03 NOTE — Progress Notes (Signed)
Cardiology Office Note   Date:  09/04/2021   ID:  Gabriela Smith, Mix [birth year], MRN [id number]  PCP:  [name], DO  Cardiologist:   Minus Breeding, MD   Chief Complaint  Patient presents with   Atrial Fibrillation      History of Present Illness: Gabriela Smith is a 81 y.o. female who presents for follow up after CABG.    She had a cardiac cath on 04/19/2019 noting severe multivessel disease, with mild osteal and severe distal left main disease involving the LCx with severe proximal and mid LAD diease, as well as ostial and mid RCA diffuse disease. As a result, she underwent CABG X 4 with LIMA to LAD, SVG to Mid LAD, SVG to OM, and SVG to PDA.    Since I last saw her she has had no new cardiac complaints.  She was started on Eliquis because of her atrial fibrillation and she developed a rash.  She just been putting up with this.  She says it does not hurt but it made her skin red.  She is also having a little burning in her toes which she thinks is neuropathy.  She is not describing calf discomfort or joint discomfort.  She is not having any other problems with the Eliquis.  She denies any chest pressure, neck or arm discomfort.  She is having no new shortness of breath, PND or orthopnea.  She had no weight gain or edema.   Past Medical History:  Diagnosis Date   AAA (abdominal aortic aneurysm) [facility])    cardiology aware    Arthritis    OA lt knee- cortizone inj. q 4 months AND OA ALSO IN BACK. Had knee replaced-issue resolved   CAD (coronary artery disease) CARDIOLOGIST- DR [facility]--  VISIT 01-02-10 IN EPIC   1993-- PTCA. Pt describes a near total blockage of apparently the LAD and a 65% stenosis elsewhere.   Cancer [facility])    History of bladder cancer followed by dr Karsten Ro   hx  TCC of bladder ,  Ta G3-  first occurence 02-20-2012--  s/p BCG tx's  and  mitomycin C   Hyperlipidemia 1993   Hypertension 1993   Nocturia    Osteopenia    Sigmoid diverticulosis      Past Surgical History:  Procedure Laterality Date   CHOLECYSTECTOMY  2003 (approx)   CLIPPING OF ATRIAL APPENDAGE N/A 04/25/2019   Procedure: Clipping Of Atrial Appendage;  Surgeon: Lajuana Matte, MD;  Location: [place];  Service: Open Heart Surgery;  Laterality: N/A;   COLONOSCOPY  05-31-2003   CORONARY ANGIOPLASTY  1993   to LAD   CORONARY ARTERY BYPASS GRAFT N/A 04/25/2019   Procedure: CORONARY ARTERY BYPASS GRAFTING (CABG) times four using left internal mammary artery and left greater saphenous vein harvested endoscipically.;  Surgeon: Lajuana Matte, MD;  Location: [place];  Service: Open Heart Surgery;  Laterality: N/A;   CYSTOSCOPY WITH BIOPSY  05/09/2011   Procedure: CYSTOSCOPY WITH BIOPSY;  Surgeon: Claybon Jabs, MD;  Location: [facility];  Service: Urology;  Laterality: N/A;  WITH BLADDER BIOPSY GYRUS   LEFT HEART CATH AND CORONARY ANGIOGRAPHY N/A 04/19/2019   Procedure: LEFT HEART CATH AND CORONARY ANGIOGRAPHY;  Surgeon: Leonie Man, MD;  Location: [place] [facility];  Service: Cardiovascular;  Laterality: N/A;   TOTAL HIP ARTHROPLASTY Right 12/25/2015   Procedure: RIGHT TOTAL HIP ARTHROPLASTY ANTERIOR APPROACH;  Surgeon: Paralee Cancel, MD;  Location: [facility];  Service: Orthopedics;  Laterality: Right;   TOTAL KNEE ARTHROPLASTY Left 11/22/2012   Procedure: LEFT TOTAL KNEE ARTHROPLASTY;  Surgeon: Mauri Pole, MD;  Location: [facility];  Service: Orthopedics;  Laterality: Left;   TRANSURETHRAL RESECTION OF BLADDER TUMOR  03/14/2011   Procedure: TRANSURETHRAL RESECTION OF BLADDER TUMOR (TURBT);  Surgeon: Claybon Jabs, MD;  Location: [facility];  Service: Urology;  Laterality: N/A;   TRANSURETHRAL RESECTION OF BLADDER TUMOR  05/09/2011   Procedure: TRANSURETHRAL RESECTION OF BLADDER TUMOR (TURBT);  Surgeon: Claybon Jabs, MD;  Location: [facility];  Service: Urology;  Laterality: N/A;   TRANSURETHRAL RESECTION OF BLADDER TUMOR   03/08/2012   Procedure: TRANSURETHRAL RESECTION OF BLADDER TUMOR (TURBT);  Surgeon: Claybon Jabs, MD;  Location[facility];  Service: Urology;  Laterality: N/A;   TRANSURETHRAL RESECTION OF BLADDER TUMOR WITH GYRUS (TURBT-GYRUS) N/A 03/24/2014   Procedure: TRANSURETHRAL RESECTION OF BLADDER TUMOR WITH GYRUS (TURBT-GYRUS);  Surgeon: Claybon Jabs, MD;  Location: [facility];  Service: Urology;  Laterality: N/A;   UMBILICAL HERNIA REPAIR  2001  (approx)     Current Outpatient Medications  Medication Sig Dispense Refill   acetaminophen (TYLENOL) 500 MG tablet Take 2 tablets (1,000 mg total) by mouth every 8 (eight) hours. 30 tablet 0   amLODipine (NORVASC) 5 MG tablet Take 1 tablet (5 mg total) by mouth daily. 90 tablet 3   aspirin EC 81 MG EC tablet Take 1 tablet (81 mg total) by mouth daily.     cholecalciferol (VITAMIN D) 1000 UNITS tablet Take 1,000 Units by mouth daily after breakfast.      Co-Enzyme Q10 200 MG CAPS Take 200 mg by mouth daily after breakfast.      digoxin (LANOXIN) 0.125 MG tablet TAKE 1 TABLET DAILY 90 tablet 1   Flaxseed, Linseed, (FLAX SEED OIL) 1000 MG CAPS Take 1,000 mg by mouth daily after breakfast.      GNP VITAMIN B-12 500 MCG tablet TAKE 1 TABLET DAILY 90 tablet 0   hydrocortisone 1 % lotion Apply 1 application. topically 2 (two) times daily. 118 mL 0   levothyroxine (SYNTHROID) 50 MCG tablet TAKE 1 TABLET DAILY 90 tablet 2   Magnesium 300 MG CAPS Take 300 mg by mouth at bedtime.      metoprolol tartrate (LOPRESSOR) 50 MG tablet TAKE 1 & 1/2 TABLETS TWICE A DAY 120 tablet 2   nitroGLYCERIN (NITROSTAT) 0.4 MG SL tablet PLACE 1 TABLET UNDER THE TONGUE AT ONSET OF CHEST PAIN EVERY 5 MINTUES UP TO 3 TIMES AS NEEDED 25 tablet 0   REPATHA SURECLICK 048 MG/ML SOAJ INJECT '140MG'$  (1ML) EVERY 2 WEEKS 2 mL 11   rivaroxaban (XARELTO) 20 MG TABS tablet Take 1 tablet (20 mg total) by mouth daily with supper. 30 tablet 6   TURMERIC PO Take 1  capsule by mouth daily after breakfast.     No current facility-administered medications for this visit.   Facility-Administered Medications Ordered in Other Visits  Medication Dose Route Frequency Provider Last Rate Last Admin   DOBUTamine (DOBUTREX) 1,000 mcg/mL in dextrose 5% 250 mL infusion  20 mcg/kg/min Intravenous Continuous Dorothy Spark, MD 96.4 mL/hr at 12/24/15 0840 20 mcg/kg/min at 12/24/15 0840    Allergies:   Fish allergy, Fish-derived products, Shellfish-derived products, Lasix [furosemide], Codeine, Latex, Macrodantin [nitrofurantoin macrocrystal], Niacin, Niaspan [niacin er], Statins, Sulfa antibiotics, Vicodin [hydrocodone-acetaminophen], and Zetia [ezetimibe]    ROS:  Please see the history of present illness.   Otherwise, review of systems are positive for none .   All other systems are reviewed and negative.    PHYSICAL EXAM: VS:  BP (!) 143/100   Pulse (!) 118   Ht 5' 3.5" (1.613 m)   Wt 179 lb (81.2 kg)   SpO2 93%   BMI 31.21 kg/m  , BMI Body mass index is 31.21 kg/m. GENERAL:  Well appearing NECK:  No jugular venous distention, waveform within normal limits, carotid upstroke brisk and symmetric, no bruits, no thyromegaly LUNGS:  Clear to auscultation bilaterally CHEST:  Unremarkable HEART:  PMI not displaced or sustained,S1 and S2 within normal limits, no S3, no clicks, no rubs, no murmurs, irregular ABD:  Flat, positive bowel sounds normal in frequency in pitch, no bruits, no rebound, no guarding, no midline pulsatile mass, no hepatomegaly, no splenomegaly EXT:  2 plus pulses throughout, no edema, no cyanosis no clubbing  EKG:  EKG is ordered today. Atrial fibrillation, rate 110, axis within normal limits, intervals within normal limits, no acute ST-T wave changes.  There are nonspecific diffuse lateral and inferior T wave changes.    Recent Labs: 07/25/2021: ALT 17; BUN 14; Creatinine, Ser 0.98; Hemoglobin 12.9; Magnesium 2.5; Platelets 274;  Potassium 4.3; Sodium 142; TSH 2.040    Lipid Panel    Component Value Date/Time   CHOL 107 11/29/2020 0934   TRIG 181 (H) 11/29/2020 0934   TRIG 414 (H) 07/31/2016 0942   HDL 40 11/29/2020 0934   HDL 49 07/31/2016 0942   CHOLHDL 2.7 11/29/2020 0934   CHOLHDL 3.8 04/16/2019 0508   VLDL 26 04/16/2019 0508   LDLCALC 37 11/29/2020 0934   LDLCALC 205 (H) 11/28/2013 1215   LDLDIRECT 193 (H) 10/21/2016 0902      Wt Readings from Last 3 Encounters:  09/04/21 179 lb (81.2 kg)  07/25/21 179 lb 3.2 oz (81.3 kg)  07/19/21 175 lb (79.4 kg)      Other studies Reviewed: Additional studies/ records that were reviewed today include: Labs . Review of the above records demonstrates:  Please see elsewhere in the note.     ASSESSMENT AND PLAN:  Lower extremity edema:     This is chronic and unchanged.  No change in therapy.    Coronary artery disease :   He has had no new symptoms.  She will continue with risk reduction.  Atrial fibrillation:     Ms. RAZIYAH VANVLECK has a CHA2DS2 - VASc score of 5.   Her rate is elevated today but this is unusual.  She brings me diarrhea rates in the 70s and 80s.  She may be having a rash related to Eliquis so I am going to stop this and start Xarelto 20 mg daily.  She does not have a left atrial appendage so she would not be a watchman candidate.  Essential hypertension: Blood pressure is elevated but this is unusual and again I reviewed her diary.  No change in therapy.  Dyslipidemia:   LDL is 37, HDL 40.  No change in therapy.    Current medicines are reviewed at length with the patient today.  The patient does not have concerns regarding medicines.  The following changes have been made: As above  Labs/ tests ordered today include: None  Orders Placed This Encounter  Procedures   EKG 12-Lead     Disposition:   FU with me in 12 months.     Signed, Jeneen Rinks  Issabela Lesko, MD  09/04/2021 5:53 PM    [place]

## 2021-09-04 ENCOUNTER — Ambulatory Visit: Payer: Medicare Other | Admitting: Cardiology

## 2021-09-04 ENCOUNTER — Encounter: Payer: Self-pay | Admitting: Cardiology

## 2021-09-04 VITALS — BP 143/100 | HR 118 | Ht 63.5 in | Wt 179.0 lb

## 2021-09-04 DIAGNOSIS — I10 Essential (primary) hypertension: Secondary | ICD-10-CM | POA: Diagnosis not present

## 2021-09-04 DIAGNOSIS — I4819 Other persistent atrial fibrillation: Secondary | ICD-10-CM

## 2021-09-04 MED ORDER — RIVAROXABAN 20 MG PO TABS: 20.0000 mg | ORAL_TABLET | Freq: Every day | ORAL | 6 refills | Status: DC

## 2021-09-04 NOTE — Patient Instructions (Addendum)
Medication Instructions:  Please discontinue your Eliquis and start Xarelto 20 mg a day. Continue all other medications as listed.  *If you need a refill on your cardiac medications before your next appointment, please call your pharmacy*   (Benfotiamine)   Follow-Up: At [facility], you and your health needs are our priority.  As part of our continuing mission to provide you with exceptional heart care, we have created designated Provider Care Teams.  These Care Teams include your primary Cardiologist (physician) and Advanced Practice Providers (APPs -  Physician Assistants and Nurse Practitioners) who all work together to provide you with the care you need, when you need it.  We recommend signing up for the patient portal called "MyChart".  Sign up information is provided on this After Visit Summary.  MyChart is used to connect with patients for Virtual Visits (Telemedicine).  Patients are able to view lab/test results, encounter notes, upcoming appointments, etc.  Non-urgent messages can be sent to your provider as well.   To learn more about what you can do with MyChart, go to [email or web address].    Your next appointment:   1 year(s)  The format for your next appointment:   In Person  Provider:   Minus Breeding, MD{   Important Information About Sugar

## 2021-09-19 ENCOUNTER — Other Ambulatory Visit: Payer: Self-pay | Admitting: Cardiology

## 2021-09-30 ENCOUNTER — Ambulatory Visit: Payer: Self-pay | Admitting: *Deleted

## 2021-09-30 ENCOUNTER — Ambulatory Visit: Payer: Medicare Other | Admitting: Licensed Clinical Social Worker

## 2021-09-30 DIAGNOSIS — C44729 Squamous cell carcinoma of skin of left lower limb, including hip: Secondary | ICD-10-CM | POA: Diagnosis not present

## 2021-09-30 DIAGNOSIS — I480 Paroxysmal atrial fibrillation: Secondary | ICD-10-CM

## 2021-09-30 DIAGNOSIS — L281 Prurigo nodularis: Secondary | ICD-10-CM | POA: Diagnosis not present

## 2021-09-30 NOTE — Chronic Care Management (AMB) (Signed)
  Chronic Care Management   Note  09/30/2021 Name: Gabriela Smith MRN: [id number] DOB: Jun 23, 1940   Patient has either met RN Care Management goals, is stable from [place] Management perspective, or has not recently engaged with the [place]. I am removing RN Care Manager from Care Team and closing [place]. If patient is currently engaged with another CCM team member I will forward this encounter to inform them of my case closure. Patient may be eligible for re-engagement with RN Care Manager in the future if necessary and can discuss this with their PCP.  Chong Sicilian, BSN, RN-BC Embedded Chronic Care Manager [facility] [place] Management Direct Dial: [phone]

## 2021-09-30 NOTE — Chronic Care Management (AMB) (Signed)
Chronic Care Management    Clinical Social Work Note  09/30/2021 Name: Gabriela Smith MRN: [id number] DOB: [birth year]  Gabriela Smith is a 81 y.o. year old female who is a primary care patient of Gabriela Norlander, DO. The CCM team was consulted to assist the patient with chronic disease management and/or care coordination needs related to: [employer] .   Engaged with patient by telephone for follow up visit in response to provider referral for social work chronic care management and care coordination services.   Consent to Services:  The patient was given information about Chronic Care Management services, agreed to services, and gave verbal consent prior to initiation of services.  Please see initial visit note for detailed documentation.   Patient agreed to services and consent obtained.   Assessment: Review of patient past medical history, allergies, medications, and health status, including review of relevant consultants reports was performed today as part of a comprehensive evaluation and provision of chronic care management and care coordination services.     SDOH (Social Determinants of Health) assessments and interventions performed:  SDOH Interventions    Flowsheet Row Most Recent Value  SDOH Interventions   Physical Activity Interventions Other (Comments)  [client has some walking challenges]  Stress Interventions Provide Counseling  [client has stress related to managing care needs of her spouse]        Advanced Directives Status: See Vynca application for related entries.  CCM Care Plan  Allergies  Allergen Reactions   Fish Allergy Hives, Shortness Of Breath and Other (See Comments)    "Bottom-feeder fish"   Fish-Derived Products Hives, Shortness Of Breath and Other (See Comments)    Bottom-feeder fish   Shellfish-Derived Products Hives and Shortness Of Breath   Lasix [Furosemide] Swelling and Other (See Comments)    Edema, elevated BP   Codeine Rash    Latex Itching and Rash    Reports Elastic in underwear, under wire bras, and now surgical cap cause rash and itching.    Macrodantin [Nitrofurantoin Macrocrystal] Other (See Comments)    Unknown reaction    Niacin Other (See Comments)    Muscle aches   Niaspan [Niacin Er] Other (See Comments)    Muscle aches   Statins Other (See Comments)    Muscle aches/ cramps   Sulfa Antibiotics Rash and Other (See Comments)    Muscle aches/ cramps, also   Vicodin [Hydrocodone-Acetaminophen] Nausea And Vomiting and Other (See Comments)    SEVERE N/V   Zetia [Ezetimibe] Other (See Comments)    Muscle aches    Outpatient Encounter Medications as of 09/30/2021  Medication Sig   acetaminophen (TYLENOL) 500 MG tablet Take 2 tablets (1,000 mg total) by mouth every 8 (eight) hours.   amLODipine (NORVASC) 5 MG tablet Take 1 tablet (5 mg total) by mouth daily.   aspirin EC 81 MG EC tablet Take 1 tablet (81 mg total) by mouth daily.   cholecalciferol (VITAMIN D) 1000 UNITS tablet Take 1,000 Units by mouth daily after breakfast.    Co-Enzyme Q10 200 MG CAPS Take 200 mg by mouth daily after breakfast.    digoxin (LANOXIN) 0.125 MG tablet Take 1 tablet (125 mcg total) by mouth daily.   Flaxseed, Linseed, (FLAX SEED OIL) 1000 MG CAPS Take 1,000 mg by mouth daily after breakfast.    GNP VITAMIN B-12 500 MCG tablet TAKE 1 TABLET DAILY   hydrocortisone 1 % lotion Apply 1 application. topically 2 (two) times daily.   levothyroxine (  SYNTHROID) 50 MCG tablet TAKE 1 TABLET DAILY   Magnesium 300 MG CAPS Take 300 mg by mouth at bedtime.    metoprolol tartrate (LOPRESSOR) 50 MG tablet TAKE 1 & 1/2 TABLETS TWICE A DAY   nitroGLYCERIN (NITROSTAT) 0.4 MG SL tablet PLACE 1 TABLET UNDER THE TONGUE AT ONSET OF CHEST PAIN EVERY 5 MINTUES UP TO 3 TIMES AS NEEDED   REPATHA SURECLICK 725 MG/ML SOAJ INJECT '140MG'$  (1ML) EVERY 2 WEEKS   rivaroxaban (XARELTO) 20 MG TABS tablet Take 1 tablet (20 mg total) by mouth daily with supper.    TURMERIC PO Take 1 capsule by mouth daily after breakfast.   Facility-Administered Encounter Medications as of 09/30/2021  Medication   DOBUTamine (DOBUTREX) 1,000 mcg/mL in dextrose 5% 250 mL infusion    Patient Active Problem List   Diagnosis Date Noted   Persistent atrial fibrillation ([place]) 07/03/2020   Edema 07/03/2020   Paroxysmal A-fib ([place]) 08/12/2019   S/P CABG x 4 04/26/2019   Acute CHF (congestive heart failure) ([place]) 04/15/2019   Disease related peripheral neuropathy 03/07/2019   BMI 33.0-33.9,adult 02/17/2019   Intermittent claudication ([place]) 02/17/2019   Peripheral vascular insufficiency ([place]) 02/17/2019   Aortic atherosclerosis ([place]) 02/11/2017   Atrophic vaginitis 09/18/2016   S/P right THA, AA 12/25/2015   S/P hip replacement 12/25/2015   AAA (abdominal aortic aneurysm) without rupture ([place]) 08/24/2015   Statin intolerance 05/23/2013   Osteopenia of right forearm 04/20/2013   Degenerative disc disease, lumbar 02/08/2013   Vitamin D deficiency 02/08/2013   Overweight (BMI 25.0-29.9) 11/24/2012   S/P left TKA 11/22/2012   Malignant neoplasm of urinary bladder ([place]) 11/04/2012   Osteoarthritis of left knee 11/04/2012   Hyperlipidemia LDL goal <70 11/28/2008   Hypertension 11/28/2008   CORONARY ATHEROSCLEROSIS NATIVE CORONARY ARTERY 11/28/2008    Conditions to be addressed/monitored: monitor client completion of ADLs. Monitor client assistance in helping meet daily care needs of her spouse  Care Plan : Gabriela Smith Care plan  Updates made by Gabriela Cabal, Gabriela Smith since 09/30/2021 12:00 AM     Problem: Coping Skills (General Plan of Care)      Goal: Coping Skills Enhanced;Manage Medical Needs   Start Date: 05/13/2021  Expected End Date: 12/03/2021  This Visit's Progress: On track  Recent Progress: On track  Priority: Medium  Note:   Current barriers:   Patient in need of assistance with connecting to community resources for possible help in managing medical  needs Patient is unable to independently navigate community resource options without care coordination support Mobility issues Some pain issues in her feet  Clinical Goals:  patient will work with SW in next 30 days to address concerns related to client management of ongoing medical needs Patient will communicate with SW in next 30 days to discuss mobility of client and pain issues of client   Clinical Interventions:  Collaboration with Gabriela Norlander, DO regarding development and update of comprehensive plan of care as evidenced by provider attestation and co-signature Discussed client needs  with Gabriela Smith. Discussed with client the mood status of client. She said she is doing well with her mood. She said she is usually in a positive mood. She and her spouse live in apartment beside home of her daughter.  Thus, her daughter is nearby if she or her spouse have any particular needs. Client feels that her mood is stable at this time Reviewed health needs of her spouse. She said her spouse has diagnosis of Parkinson's  Disease. She said that she and her spouse are managing his health needs well at present, with help of their daughter.  Discussed family support. Client has support from her daughter who lives next door to client Reviewed her support from cardiologist. Gabriela Smith said she had appointment last week with Dr. Starleen Blue, cardiologist. Discussed SW needs of [place]. She said she thought she was stable related to SW needs. Gabriela Smith and Gabriela Smith discussed that since she had no  current SW needs, that Gabriela Smith would discharge her today from [facility] . [place] agreed to this plan Gabriela Smith thanked Gabriela Smith for participating with RN and Gabriela Smith in CCM program support. Of note, [place] did say she had a skin cancer removed from her leg recently. She said she thought there were sutures in place and that her leg was currently wrapped. Dermotologist in [place], [place] removed skin cancer per client  Patient  Strengths/Coping Skills: Attends scheduled medical appointments Takes medications as prescribed Has support from daughter, Gabriela Smith  Patient Defictis Mobility issues Some difficulties in completing ADLs Some Pain issues  Patient Goals:  Patient to attend scheduled medical appointments in next 30 days Patient to talk regularly with her daughter, Gabriela Smith, in next 30 days about needs of client  Follow Up Plan: Gabriela Smith  is discharging client today from [facility]. Client agreed to this plan.      [name].Linford Quintela MSW, [place] representative [facility] Care Management [phone]

## 2021-09-30 NOTE — Patient Instructions (Signed)
Gabriela Smith  I have previously worked with you through the Chronic Care Management Program at [facility]. Due to program changes I am removing myself from your care team because you've either met our goals, your conditions are stable and no longer require care management, or we haven't engaged within the past 6 months. If you are currently active with another CCM Team Member, you will remain active with them unless they reach out to you with additional information. If you feel that you need RN Care Management services in the future, please talk with your primary care provider to discuss re-engagement with the RN Care Manager that will be assigned to [facility]. This does not affect your status as a patient at [name].   Thank you for allowing me to participate in your your healthcare journey.  Chong Sicilian, BSN, RN-BC Embedded Chronic Care Manager [facility] [place] Management Direct Dial: [phone]

## 2021-09-30 NOTE — Patient Instructions (Addendum)
Visit Information  Patient Goals:  Protect My Health (Patient).  Manage Medical Needs  Timeframe:  Short-Term Goal Priority:  Medium Progress: On Track Start Date:             05/13/21              Expected End Date:           11/30/21            Follow Up Date  LCSW is discharging client today from [facility].    Protect My Health (Patient)  Manage Medical needs    Why is this important?   Screening tests can find diseases early when they are easier to treat.  Your doctor or nurse will talk with you about which tests are important for you.  Getting shots for common diseases like the flu and shingles will help prevent them.     Patient Strengths/Coping Skills: Attends scheduled medical appointments Takes medications as prescribed Has support from daughter, Neita Goodnight  Patient Defictis Mobility issues Some difficulties in completing ADLs Some Pain issues  Patient Goals:  Patient to attend scheduled medical appointments in next 30 days Patient to talk regularly with her daughter, Amy, in next 30 days about needs of client  Follow Up Plan: LCSW  is discharging client today from [facility].Elva Breaker MSW, [place] representative [facility] Care Management [phone]

## 2021-11-18 DIAGNOSIS — Z85828 Personal history of other malignant neoplasm of skin: Secondary | ICD-10-CM | POA: Diagnosis not present

## 2021-11-18 DIAGNOSIS — Z08 Encounter for follow-up examination after completed treatment for malignant neoplasm: Secondary | ICD-10-CM | POA: Diagnosis not present

## 2021-12-09 ENCOUNTER — Other Ambulatory Visit: Payer: Self-pay | Admitting: *Deleted

## 2021-12-09 ENCOUNTER — Ambulatory Visit (INDEPENDENT_AMBULATORY_CARE_PROVIDER_SITE_OTHER): Payer: Medicare Other

## 2021-12-09 DIAGNOSIS — Z23 Encounter for immunization: Secondary | ICD-10-CM | POA: Diagnosis not present

## 2021-12-09 MED ORDER — LEVOTHYROXINE SODIUM 50 MCG PO TABS: 50.0000 ug | ORAL_TABLET | Freq: Every day | ORAL | 2 refills | Status: DC

## 2021-12-20 ENCOUNTER — Other Ambulatory Visit: Payer: Self-pay | Admitting: Cardiology

## 2021-12-23 DIAGNOSIS — Z85828 Personal history of other malignant neoplasm of skin: Secondary | ICD-10-CM | POA: Diagnosis not present

## 2021-12-23 DIAGNOSIS — Z08 Encounter for follow-up examination after completed treatment for malignant neoplasm: Secondary | ICD-10-CM | POA: Diagnosis not present

## 2021-12-23 DIAGNOSIS — L928 Other granulomatous disorders of the skin and subcutaneous tissue: Secondary | ICD-10-CM | POA: Diagnosis not present

## 2022-01-02 ENCOUNTER — Ambulatory Visit: Payer: Medicare Other | Admitting: Nurse Practitioner

## 2022-01-03 ENCOUNTER — Encounter: Payer: Self-pay | Admitting: Family

## 2022-01-03 ENCOUNTER — Ambulatory Visit (INDEPENDENT_AMBULATORY_CARE_PROVIDER_SITE_OTHER): Payer: Medicare Other | Admitting: Family

## 2022-01-03 VITALS — BP 129/78 | HR 66 | Temp 97.8°F | Ht 63.5 in | Wt 179.4 lb

## 2022-01-03 DIAGNOSIS — B379 Candidiasis, unspecified: Secondary | ICD-10-CM | POA: Diagnosis not present

## 2022-01-03 DIAGNOSIS — B3731 Acute candidiasis of vulva and vagina: Secondary | ICD-10-CM

## 2022-01-03 LAB — URINALYSIS, COMPLETE
Bilirubin, UA: NEGATIVE
Glucose, UA: NEGATIVE
Ketones, UA: NEGATIVE
Nitrite, UA: NEGATIVE
Specific Gravity, UA: 1.02 (ref 1.005–1.030)
Urobilinogen, Ur: 0.2 mg/dL (ref 0.2–1.0)
pH, UA: 6.5 (ref 5.0–7.5)

## 2022-01-03 LAB — MICROSCOPIC EXAMINATION: Renal Epithel, UA: NONE SEEN /hpf

## 2022-01-03 LAB — WET PREP FOR TRICH, YEAST, CLUE
Clue Cell Exam: NEGATIVE
Trichomonas Exam: NEGATIVE
Yeast Exam: NEGATIVE

## 2022-01-03 MED ORDER — FLUCONAZOLE 150 MG PO TABS: 150.0000 mg | ORAL_TABLET | ORAL | 0 refills | Status: DC | PRN

## 2022-01-03 NOTE — Progress Notes (Signed)
   Subjective:    Patient ID: Gabriela Smith, female    DOB: 12-21-1940, 81 y.o.   MRN: [id number]  Chief Complaint  Patient presents with   Vaginitis    Little over a week    Pt presents to the office today for vaginal itching. She has been using OTC vaginal cream with moderate relief.  Vaginal Itching The patient's primary symptoms include genital itching. The patient's pertinent negatives include no genital lesions, genital odor, vaginal bleeding or vaginal discharge. This is a new problem. The current episode started 1 to 4 weeks ago. The problem occurs intermittently. The problem has been gradually improving. The patient is experiencing no pain. Associated symptoms include frequency. Pertinent negatives include no diarrhea, discolored urine, dysuria, fever, flank pain, joint swelling, nausea or painful intercourse.      Review of Systems  Constitutional:  Negative for fever.  Gastrointestinal:  Negative for diarrhea and nausea.  Genitourinary:  Positive for frequency. Negative for dysuria, flank pain and vaginal discharge.  All other systems reviewed and are negative.      Objective:   Physical Exam Vitals reviewed.  Constitutional:      General: She is not in acute distress.    Appearance: She is well-developed. She is obese.  HENT:     Head: Normocephalic and atraumatic.  Eyes:     Pupils: Pupils are equal, round, and reactive to light.  Neck:     Thyroid: No thyromegaly.  Cardiovascular:     Rate and Rhythm: Normal rate and regular rhythm.     Heart sounds: Normal heart sounds. No murmur heard. Pulmonary:     Effort: Pulmonary effort is normal. No respiratory distress.     Breath sounds: Normal breath sounds. No wheezing.  Abdominal:     General: Bowel sounds are normal. There is no distension.     Palpations: Abdomen is soft.     Tenderness: There is no abdominal tenderness.  Musculoskeletal:        General: No tenderness. Normal range of motion.     Cervical  back: Normal range of motion and neck supple.  Skin:    General: Skin is warm and dry.  Neurological:     Mental Status: She is alert and oriented to person, place, and time.     Cranial Nerves: No cranial nerve deficit.     Deep Tendon Reflexes: Reflexes are normal and symmetric.  Psychiatric:        Behavior: Behavior normal.        Thought Content: Thought content normal.        Judgment: Judgment normal.       BP 129/78   Pulse 66   Temp 97.8 F (36.6 C) (Temporal)   Ht 5' 3.5" (1.613 m)   Wt 179 lb 6.4 oz (81.4 kg)   SpO2 98%   BMI 31.28 kg/m      Assessment & Plan:  Gabriela Smith comes in today with chief complaint of Vaginitis (Little over a week )   Diagnosis and orders addressed:  1. Yeast infection  - Urinalysis, Complete - WET PREP FOR TRICH, YEAST, CLUE  2. Vagina, candidiasis Keep clean and dry Diflucan as needed  - fluconazole (DIFLUCAN) 150 MG tablet; Take 1 tablet (150 mg total) by mouth every three (3) days as needed.  Dispense: 3 tablet; Refill: 0   [name], FNP

## 2022-01-03 NOTE — Patient Instructions (Signed)

## 2022-01-22 ENCOUNTER — Other Ambulatory Visit: Payer: Self-pay | Admitting: Cardiology

## 2022-01-28 ENCOUNTER — Telehealth: Payer: Self-pay | Admitting: Family Medicine

## 2022-01-28 DIAGNOSIS — I480 Paroxysmal atrial fibrillation: Secondary | ICD-10-CM

## 2022-01-28 NOTE — Telephone Encounter (Signed)
Pt aware- refused to schedule with triage due to so much going on with holidays. Patient is going to keep track of bp for the next week and will send [place] bp readings next wednesday

## 2022-01-28 NOTE — Telephone Encounter (Signed)
Have her come in for BP check with nurse next week.  Continue monitoring BP.  If remains <150/90, ok to stay off med.

## 2022-01-28 NOTE — Telephone Encounter (Signed)
Pt has misplaced her amlodipine and hasn't taken it in a couple days. Says her bp has been running good. Wants to know if she needs to continue taking

## 2022-02-04 ENCOUNTER — Ambulatory Visit (INDEPENDENT_AMBULATORY_CARE_PROVIDER_SITE_OTHER): Payer: Medicare Other

## 2022-02-04 ENCOUNTER — Telehealth: Payer: Self-pay | Admitting: Family Medicine

## 2022-02-04 VITALS — HR 91 | Ht 63.0 in | Wt 174.0 lb

## 2022-02-04 DIAGNOSIS — Z0000 Encounter for general adult medical examination without abnormal findings: Secondary | ICD-10-CM

## 2022-02-04 DIAGNOSIS — Z780 Asymptomatic menopausal state: Secondary | ICD-10-CM

## 2022-02-04 DIAGNOSIS — I480 Paroxysmal atrial fibrillation: Secondary | ICD-10-CM

## 2022-02-04 MED ORDER — AMLODIPINE BESYLATE 5 MG PO TABS: 5.0000 mg | ORAL_TABLET | Freq: Every day | ORAL | 3 refills | Status: DC

## 2022-02-04 NOTE — Patient Instructions (Signed)
Gabriela Smith , Thank you for taking time to come for your Medicare Wellness Visit. I appreciate your ongoing commitment to your health goals. Please review the following plan we discussed and let me know if I can assist you in the future.   These are the goals we discussed:  Goals      DIET - INCREASE WATER INTAKE     Try to drink 6-8 glasses of water daily.     DIET - REDUCE SUGAR INTAKE     Patient states she would like to lose 10 lbs and plans to do this by decreasing her carbohydrate intake.         This is a list of the screening recommended for you and due dates:  Health Maintenance  Topic Date Due   COVID-19 Vaccine (6 - 2023-24 season) 11/08/2021   DEXA scan (bone density measurement)  01/26/2022   Medicare Annual Wellness Visit  02/05/2023   Pneumonia Vaccine  Completed   Flu Shot  Completed   Zoster (Shingles) Vaccine  Completed   HPV Vaccine  Aged Out   Mammogram  Discontinued    Advanced directives: Please bring a copy of your health care power of attorney and living will to the office to be added to your chart at your convenience.   Conditions/risks identified: Aim for 30 minutes of exercise or brisk walking, 6-8 glasses of water, and 5 servings of fruits and vegetables each day.   Next appointment: Follow up in one year for your annual wellness visit    Preventive Care 65 Years and Older, Female Preventive care refers to lifestyle choices and visits with your health care provider that can promote health and wellness. What does preventive care include? A yearly physical exam. This is also called an annual well check. Dental exams once or twice a year. Routine eye exams. Ask your health care provider how often you should have your eyes checked. Personal lifestyle choices, including: Daily care of your teeth and gums. Regular physical activity. Eating a healthy diet. Avoiding tobacco and drug use. Limiting alcohol use. Practicing safe sex. Taking low-dose  aspirin every day. Taking vitamin and mineral supplements as recommended by your health care provider. What happens during an annual well check? The services and screenings done by your health care provider during your annual well check will depend on your age, overall health, lifestyle risk factors, and family history of disease. Counseling  Your health care provider may ask you questions about your: Alcohol use. Tobacco use. Drug use. Emotional well-being. Home and relationship well-being. Sexual activity. Eating habits. History of falls. Memory and ability to understand (cognition). Work and work Statistician. Reproductive health. Screening  You may have the following tests or measurements: Height, weight, and BMI. Blood pressure. Lipid and cholesterol levels. These may be checked every 5 years, or more frequently if you are over 1 years old. Skin check. Lung cancer screening. You may have this screening every year starting at age 59 if you have a 30-pack-year history of smoking and currently smoke or have quit within the past 15 years. Fecal occult blood test (FOBT) of the stool. You may have this test every year starting at age 30. Flexible sigmoidoscopy or colonoscopy. You may have a sigmoidoscopy every 5 years or a colonoscopy every 10 years starting at age 45. Hepatitis C blood test. Hepatitis B blood test. Sexually transmitted disease (STD) testing. Diabetes screening. This is done by checking your blood sugar (glucose) after you have not  eaten for a while (fasting). You may have this done every 1-3 years. Bone density scan. This is done to screen for osteoporosis. You may have this done starting at age 26. Mammogram. This may be done every 1-2 years. Talk to your health care provider about how often you should have regular mammograms. Talk with your health care provider about your test results, treatment options, and if necessary, the need for more tests. Vaccines  Your  health care provider may recommend certain vaccines, such as: Influenza vaccine. This is recommended every year. Tetanus, diphtheria, and acellular pertussis (Tdap, Td) vaccine. You may need a Td booster every 10 years. Zoster vaccine. You may need this after age 17. Pneumococcal 13-valent conjugate (PCV13) vaccine. One dose is recommended after age 64. Pneumococcal polysaccharide (PPSV23) vaccine. One dose is recommended after age 68. Talk to your health care provider about which screenings and vaccines you need and how often you need them. This information is not intended to replace advice given to you by your health care provider. Make sure you discuss any questions you have with your health care provider. Document Released: 03/23/2015 Document Revised: 11/14/2015 Document Reviewed: 12/26/2014 Elsevier Interactive Patient Education  2017 [place] Prevention in the Home Falls can cause injuries. They can happen to people of all ages. There are many things you can do to make your home safe and to help prevent falls. What can I do on the outside of my home? Regularly fix the edges of walkways and driveways and fix any cracks. Remove anything that might make you trip as you walk through a door, such as a raised step or threshold. Trim any bushes or trees on the path to your home. Use bright outdoor lighting. Clear any walking paths of anything that might make someone trip, such as rocks or tools. Regularly check to see if handrails are loose or broken. Make sure that both sides of any steps have handrails. Any raised decks and porches should have guardrails on the edges. Have any leaves, snow, or ice cleared regularly. Use sand or salt on walking paths during winter. Clean up any spills in your garage right away. This includes oil or grease spills. What can I do in the bathroom? Use night lights. Install grab bars by the toilet and in the tub and shower. Do not use towel bars as  grab bars. Use non-skid mats or decals in the tub or shower. If you need to sit down in the shower, use a plastic, non-slip stool. Keep the floor dry. Clean up any water that spills on the floor as soon as it happens. Remove soap buildup in the tub or shower regularly. Attach bath mats securely with double-sided non-slip rug tape. Do not have throw rugs and other things on the floor that can make you trip. What can I do in the bedroom? Use night lights. Make sure that you have a light by your bed that is easy to reach. Do not use any sheets or blankets that are too big for your bed. They should not hang down onto the floor. Have a firm chair that has side arms. You can use this for support while you get dressed. Do not have throw rugs and other things on the floor that can make you trip. What can I do in the kitchen? Clean up any spills right away. Avoid walking on wet floors. Keep items that you use a lot in easy-to-reach places. If you need to reach  something above you, use a strong step stool that has a grab bar. Keep electrical cords out of the way. Do not use floor polish or wax that makes floors slippery. If you must use wax, use non-skid floor wax. Do not have throw rugs and other things on the floor that can make you trip. What can I do with my stairs? Do not leave any items on the stairs. Make sure that there are handrails on both sides of the stairs and use them. Fix handrails that are broken or loose. Make sure that handrails are as long as the stairways. Check any carpeting to make sure that it is firmly attached to the stairs. Fix any carpet that is loose or worn. Avoid having throw rugs at the top or bottom of the stairs. If you do have throw rugs, attach them to the floor with carpet tape. Make sure that you have a light switch at the top of the stairs and the bottom of the stairs. If you do not have them, ask someone to add them for you. What else can I do to help prevent  falls? Wear shoes that: Do not have high heels. Have rubber bottoms. Are comfortable and fit you well. Are closed at the toe. Do not wear sandals. If you use a stepladder: Make sure that it is fully opened. Do not climb a closed stepladder. Make sure that both sides of the stepladder are locked into place. Ask someone to hold it for you, if possible. Clearly mark and make sure that you can see: Any grab bars or handrails. First and last steps. Where the edge of each step is. Use tools that help you move around (mobility aids) if they are needed. These include: Canes. Walkers. Scooters. Crutches. Turn on the lights when you go into a dark area. Replace any light bulbs as soon as they burn out. Set up your furniture so you have a clear path. Avoid moving your furniture around. If any of your floors are uneven, fix them. If there are any pets around you, be aware of where they are. Review your medicines with your doctor. Some medicines can make you feel dizzy. This can increase your chance of falling. Ask your doctor what other things that you can do to help prevent falls. This information is not intended to replace advice given to you by your health care provider. Make sure you discuss any questions you have with your health care provider. Document Released: 12/21/2008 Document Revised: 08/02/2015 Document Reviewed: 03/31/2014 Elsevier Interactive Patient Education  2017 [employer].

## 2022-02-04 NOTE — Progress Notes (Signed)
Subjective:   Gabriela Smith is a 81 y.o. female who presents for Medicare Annual (Subsequent) preventive examination. I connected with  Gabriela Smith on 02/04/22 by a audio enabled telemedicine application and verified that I am speaking with the correct person using two identifiers.  Patient Location: Home  Provider Location: Home Office  I discussed the limitations of evaluation and management by telemedicine. The patient expressed understanding and agreed to proceed.  Review of Systems     Cardiac Risk Factors include: advanced age (>73mn, >>58women);hypertension;dyslipidemia     Objective:    Today's Vitals   02/04/22 0859  Pulse: 91  Weight: 174 lb (78.9 kg)  Height: '5\' 3"'$  (1.6 m)   Body mass index is 30.82 kg/m.     12/05/2020   10:00 AM 02/07/2020   12:50 PM 12/05/2019   10:11 AM 04/17/2019    6:00 PM 04/16/2019    5:00 PM 04/15/2019   12:01 PM 12/02/2018   10:13 AM  Advanced Directives  Does Patient Have a Medical Advance Directive? Yes Yes Yes  Yes Yes Yes  Type of AParamedicof [place] will  [place] Living will   [place]  Does patient want to make changes to medical advance directive?   No - Patient declined No - Patient declined No - Patient declined  No - Patient declined  Copy of [place] Chart? Yes - validated most recent copy scanned in chart (See row information)  No - copy requested    No - copy requested    Current Medications (verified) Outpatient Encounter Medications as of 02/04/2022  Medication Sig   acetaminophen (TYLENOL) 500 MG tablet Take 2 tablets (1,000 mg total) by mouth every 8 (eight) hours.   amLODipine (NORVASC) 5 MG tablet Take 1 tablet (5 mg total) by mouth daily.   aspirin EC 81 MG EC tablet Take 1 tablet (81 mg total) by mouth daily.   cholecalciferol (VITAMIN D) 1000 UNITS tablet Take 1,000 Units by mouth daily  after breakfast.    Co-Enzyme Q10 200 MG CAPS Take 200 mg by mouth daily after breakfast.    digoxin (LANOXIN) 0.125 MG tablet Take 1 tablet (125 mcg total) by mouth daily.   Flaxseed, Linseed, (FLAX SEED OIL) 1000 MG CAPS Take 1,000 mg by mouth daily after breakfast.    fluconazole (DIFLUCAN) 150 MG tablet Take 1 tablet (150 mg total) by mouth every three (3) days as needed.   GNP VITAMIN B-12 500 MCG tablet TAKE 1 TABLET DAILY   hydrocortisone 1 % lotion Apply 1 application. topically 2 (two) times daily.   levothyroxine (SYNTHROID) 50 MCG tablet Take 1 tablet (50 mcg total) by mouth daily.   Magnesium 300 MG CAPS Take 300 mg by mouth at bedtime.    metoprolol tartrate (LOPRESSOR) 50 MG tablet TAKE 1 AND 1/2 TABLETS BY MOUTH TWICE DAILY   nitroGLYCERIN (NITROSTAT) 0.4 MG SL tablet PLACE 1 TABLET UNDER THE TONGUE AT ONSET OF CHEST PAIN EVERY 5 MINTUES UP TO 3 TIMES AS NEEDED   REPATHA SURECLICK 1588MG/ML SOAJ INJECT '140MG'$  (1ML) EVERY 2 WEEKS   rivaroxaban (XARELTO) 20 MG TABS tablet Take 1 tablet (20 mg total) by mouth daily with supper.   traMADol (ULTRAM) 50 MG tablet Take by mouth every 6 (six) hours as needed.   TURMERIC PO Take 1 capsule by mouth daily after breakfast.   Facility-Administered Encounter Medications  as of 02/04/2022  Medication   DOBUTamine (DOBUTREX) 1,000 mcg/mL in dextrose 5% 250 mL infusion    Allergies (verified) Fish allergy, Fish-derived products, Shellfish-derived products, Lasix [furosemide], Codeine, Latex, Macrodantin [nitrofurantoin macrocrystal], Niacin, Niaspan [niacin er], Statins, Sulfa antibiotics, Vicodin [hydrocodone-acetaminophen], and Zetia [ezetimibe]   History: Past Medical History:  Diagnosis Date   AAA (abdominal aortic aneurysm) [facility])    cardiology aware    Arthritis    OA lt knee- cortizone inj. q 4 months AND OA ALSO IN BACK. Had knee replaced-issue resolved   CAD (coronary artery disease) CARDIOLOGIST- [facility]--  VISIT 01-02-10 IN  EPIC   1993-- PTCA. Pt describes a near total blockage of apparently the LAD and a 65% stenosis elsewhere.   Cancer [facility])    History of bladder cancer followed by dr Karsten Ro   hx  TCC of bladder ,  Ta G3-  first occurence 02-20-2012--  s/p BCG tx's  and  mitomycin C   Hyperlipidemia 1993   Hypertension 1993   Nocturia    Osteopenia    Sigmoid diverticulosis    Past Surgical History:  Procedure Laterality Date   CHOLECYSTECTOMY  2003 (approx)   CLIPPING OF ATRIAL APPENDAGE N/A 04/25/2019   Procedure: Clipping Of Atrial Appendage;  Surgeon: Lajuana Matte, MD;  Location: [place];  Service: Open Heart Surgery;  Laterality: N/A;   COLONOSCOPY  05-31-2003   CORONARY ANGIOPLASTY  1993   to LAD   CORONARY ARTERY BYPASS GRAFT N/A 04/25/2019   Procedure: CORONARY ARTERY BYPASS GRAFTING (CABG) times four using left internal mammary artery and left greater saphenous vein harvested endoscipically.;  Surgeon: Lajuana Matte, MD;  Location: [place];  Service: Open Heart Surgery;  Laterality: N/A;   CYSTOSCOPY WITH BIOPSY  05/09/2011   Procedure: CYSTOSCOPY WITH BIOPSY;  Surgeon: Claybon Jabs, MD;  Location: [facility];  Service: Urology;  Laterality: N/A;  WITH BLADDER BIOPSY GYRUS   LEFT HEART CATH AND CORONARY ANGIOGRAPHY N/A 04/19/2019   Procedure: LEFT HEART CATH AND CORONARY ANGIOGRAPHY;  Surgeon: Leonie Man, MD;  Location: [facility];  Service: Cardiovascular;  Laterality: N/A;   TOTAL HIP ARTHROPLASTY Right 12/25/2015   Procedure: RIGHT TOTAL HIP ARTHROPLASTY ANTERIOR APPROACH;  Surgeon: Paralee Cancel, MD;  Location: [facility];  Service: Orthopedics;  Laterality: Right;   TOTAL KNEE ARTHROPLASTY Left 11/22/2012   Procedure: LEFT TOTAL KNEE ARTHROPLASTY;  Surgeon: Mauri Pole, MD;  Location: [facility];  Service: Orthopedics;  Laterality: Left;   TRANSURETHRAL RESECTION OF BLADDER TUMOR  03/14/2011   Procedure: TRANSURETHRAL RESECTION OF BLADDER TUMOR (TURBT);   Surgeon: Claybon Jabs, MD;  Location: [facility];  Service: Urology;  Laterality: N/A;   TRANSURETHRAL RESECTION OF BLADDER TUMOR  05/09/2011   Procedure: TRANSURETHRAL RESECTION OF BLADDER TUMOR (TURBT);  Surgeon: Claybon Jabs, MD;  Location: [facility];  Service: Urology;  Laterality: N/A;   TRANSURETHRAL RESECTION OF BLADDER TUMOR  03/08/2012   Procedure: TRANSURETHRAL RESECTION OF BLADDER TUMOR (TURBT);  Surgeon: Claybon Jabs, MD;  Location: [facility];  Service: Urology;  Laterality: N/A;   TRANSURETHRAL RESECTION OF BLADDER TUMOR WITH GYRUS (TURBT-GYRUS) N/A 03/24/2014   Procedure: TRANSURETHRAL RESECTION OF BLADDER TUMOR WITH GYRUS (TURBT-GYRUS);  Surgeon: Claybon Jabs, MD;  Location[facility];  Service: Urology;  Laterality: N/A;   UMBILICAL HERNIA REPAIR  2001  (approx)   Family History  Problem Relation Age of Onset  Coronary artery disease Father 46   Hyperlipidemia Father    Heart attack Mother 61   Hyperlipidemia Mother    Osteoporosis Sister    Hyperlipidemia Sister    Heart attack Daughter 40       CABG x 5   Heart disease Son 65       Stents x 3   Social History   Socioeconomic History   Marital status: Married    Spouse name: Jeneen Rinks   Number of children: 3   Years of education: 12   Highest education level: High school graduate  Occupational History   Occupation: retired  Tobacco Use   Smoking status: Former    Packs/day: 0.50    Years: 5.00    Total pack years: 2.50    Types: Cigarettes    Quit date: 03/09/1976    Years since quitting: 45.9   Smokeless tobacco: Never  Vaping Use   Vaping Use: Never used  Substance and Sexual Activity   Alcohol use: Yes    Alcohol/week: 4.0 standard drinks of alcohol    Types: 4 Glasses of wine per week    Comment: red wine   Drug use: No   Sexual activity: Not Currently  Other Topics Concern   Not on file  Social History Narrative   Married  with 3 children and 4 grandchildren.  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 with Parkinsons   Lives in garage apartment at her daughters home.   Social Determinants of Health   Financial Resource Strain: Low Risk  (02/04/2022)   Overall Financial Resource Strain (CARDIA)    Difficulty of Paying Living Expenses: Not hard at all  Food Insecurity: No Food Insecurity (02/04/2022)   Hunger Vital Sign    Worried About Running Out of Food in the Last Year: Never true    Ran Out of Food in the Last Year: Never true  Transportation Needs: No Transportation Needs (02/04/2022)   PRAPARE - Hydrologist (Medical): No    Lack of Transportation (Non-Medical): No  Physical Activity: Insufficiently Active (02/04/2022)   Exercise Vital Sign    Days of Exercise per Week: 3 days    Minutes of Exercise per Session: 30 min  Stress: No Stress Concern Present (02/04/2022)   [place]    Feeling of Stress : Not at all  Social Connections: Moderately Integrated (02/04/2022)   Social Connection and Isolation Panel [NHANES]    Frequency of Communication with Friends and Family: More than three times a week    Frequency of Social Gatherings with Friends and Family: More than three times a week    Attends Religious Services: 1 to 4 times per year    Active Member of [employer] or Organizations: No    Attends Music therapist: Never    Marital Status: Married    Tobacco Counseling Counseling given: Not Answered   Clinical Intake:  Pre-visit preparation completed: Yes  Pain : No/denies pain     Nutritional Risks: None Diabetes: No  How often do you need to have someone help you when you read instructions, pamphlets, or other written materials from your doctor or pharmacy?: 1 - Never  Diabetic?no   Interpreter Needed?: No  Information entered by :: Jadene Pierini, LPN   Activities of Daily Living     02/04/2022    9:04 AM  In your present state of health, do you have any difficulty performing the  following activities:  Hearing? 0  Vision? 0  Difficulty concentrating or making decisions? 0  Walking or climbing stairs? 0  Dressing or bathing? 0  Doing errands, shopping? 0  Preparing Food and eating ? N  Using the Toilet? N  In the past six months, have you accidently leaked urine? N  Do you have problems with loss of bowel control? N  Managing your Medications? N  Managing your Finances? N  Housekeeping or managing your Housekeeping? N    Patient Care Team: [name], DO as PCP - General (Family Medicine) Minus Breeding, MD as PCP - Cardiology (Cardiology) Kathie Rhodes, MD (Inactive) as Consulting Physician (Urology) Katy Apo, MD as Consulting Physician (Ophthalmology) Lajuana Matte, MD as Consulting Physician (Cardiothoracic Surgery)  Indicate any recent Medical Services you may have received from other than [facility] providers in the past year (date may be approximate).     Assessment:   This is a routine wellness examination for Sussan.  Hearing/Vision screen Vision Screening - Comments:: Wears rx glasses - up to date with routine eye exams with  Dr.Lyles  Dietary issues and exercise activities discussed: Current Exercise Habits: Home exercise routine, Type of exercise: walking, Time (Minutes): 30, Frequency (Times/Week): 3, Weekly Exercise (Minutes/Week): 90, Intensity: Mild, Exercise limited by: None identified   Goals Addressed             This Visit's Progress    DIET - INCREASE WATER INTAKE   On track    Try to drink 6-8 glasses of water daily.     DIET - REDUCE SUGAR INTAKE   On track    Patient states she would like to lose 10 lbs and plans to do this by decreasing her carbohydrate intake.        Depression Screen    01/03/2022    8:21 AM 07/25/2021    1:44 PM 07/19/2021   11:17 AM 01/22/2021    9:51 AM 01/09/2021   11:17 AM  12/05/2020    9:15 AM 10/26/2020   11:10 AM  PHQ 2/9 Scores  PHQ - 2 Score 0 0 0 0 0 0 0  PHQ- 9 Score 0   0 0 0 0  Exception Documentation   Patient refusal        Fall Risk    02/04/2022    9:00 AM 01/03/2022    8:21 AM 07/25/2021    1:44 PM 01/09/2021   11:17 AM 12/05/2020    9:59 AM  Fall Risk   Falls in the past year? 0 0 0 0 0  Number falls in past yr: 0    0  Injury with Fall? 0    0  Risk for fall due to : No Fall Risks    No Fall Risks  Follow up Falls prevention discussed    Falls prevention discussed    FALL RISK PREVENTION PERTAINING TO THE HOME:  Any stairs in or around the home? Yes  If so, are there any without handrails? No  Home free of loose throw rugs in walkways, pet beds, electrical cords, etc? Yes  Adequate lighting in your home to reduce risk of falls? Yes   ASSISTIVE DEVICES UTILIZED TO PREVENT FALLS:  Life alert? No  Use of a cane, walker or w/c? No  Grab bars in the bathroom? Yes  Shower chair or bench in shower? Yes  Elevated toilet seat or a handicapped toilet? Yes       12/01/2017  8:[place] Exam  Orientation to time 5  Orientation to Place 5  Registration 3  Attention/ Calculation 5  Recall 3  Language- name 2 objects 2  Language- repeat 1  Language- follow 3 step command 3  Language- read & follow direction 1  Write a sentence 1  Copy design 1  Total score 30        02/04/2022    9:04 AM 12/05/2019    9:59 AM 12/02/2018   10:14 AM  6CIT Screen  What Year? 0 points 0 points 0 points  What month? 0 points 0 points 0 points  What time? 0 points 0 points 0 points  Count back from 20 0 points 0 points 0 points  Months in reverse 0 points 0 points 0 points  Repeat phrase 0 points 0 points 2 points  Total Score 0 points 0 points 2 points    Immunizations Immunization History  Administered Date(s) Administered   Fluad Quad(high Dose 65+) 11/17/2018, 12/09/2019, 12/11/2020, 12/09/2021   Influenza,  High Dose Seasonal PF 12/07/2015, 12/16/2016, 12/21/2017   Influenza,inj,Quad PF,6+ Mos 12/21/2012, 01/04/2014, 12/25/2014   Moderna Covid-19 Vaccine Bivalent Booster 43yr & up 12/11/2020   PFIZER(Purple Top)SARS-COV-2 Vaccination 03/30/2019, 06/25/2019, 11/29/2019, 08/02/2020   Pneumococcal Conjugate-13 06/28/2014   Pneumococcal Polysaccharide-23 11/04/2012   Tdap 10/09/2010   Zoster Recombinat (Shingrix) 03/28/2021, 05/27/2021   Zoster, Live 07/15/2006    TDAP status: Due, Education has been provided regarding the importance of this vaccine. Advised may receive this vaccine at local pharmacy or Health Dept. Aware to provide a copy of the vaccination record if obtained from local pharmacy or Health Dept. Verbalized acceptance and understanding.  Flu Vaccine status: Up to date  Pneumococcal vaccine status: Up to date  Covid-19 vaccine status: Completed vaccines  Qualifies for Shingles Vaccine? Yes   Zostavax completed Yes   Shingrix Completed?: Yes  Screening Tests Health Maintenance  Topic Date Due   COVID-19 Vaccine (6 - 2023-24 season) 11/08/2021   DEXA SCAN  01/26/2022   Medicare Annual Wellness (AWV)  02/05/2023   Pneumonia Vaccine 81 Years old  Completed   INFLUENZA VACCINE  Completed   Zoster Vaccines- Shingrix  Completed   HPV VACCINES  Aged Out   MAMMOGRAM  Discontinued    Health Maintenance  Health Maintenance Due  Topic Date Due   COVID-19 Vaccine (6 - 2023-24 season) 11/08/2021   DEXA SCAN  01/26/2022    Colorectal cancer screening: No longer required.   Mammogram status: No longer required due to age.  Bone Density status: Ordered 02/04/2022. Pt provided with contact info and advised to call to schedule appt.  Lung Cancer Screening: (Low Dose CT Chest recommended if Age 83-80years, 30 pack-year currently smoking OR have quit w/in 15years.) does not qualify.   Lung Cancer Screening Referral: n/a  Additional Screening:  Hepatitis C Screening: does  not qualify;  Vision Screening: Recommended annual ophthalmology exams for early detection of glaucoma and other disorders of the eye. Is the patient up to date with their annual eye exam?  Yes  Who is the provider or what is the name of the office in which the patient attends annual eye exams? Dr.Lyles  If pt is not established with a provider, would they like to be referred to a provider to establish care? No .   Dental Screening: Recommended annual dental exams for proper oral hygiene  Community Resource Referral / Chronic Care Management: CRR required  this visit?  No   CCM required this visit?  No      Plan:     I have personally reviewed and noted the following in the patient's chart:   Medical and social history Use of alcohol, tobacco or illicit drugs  Current medications and supplements including opioid prescriptions. Patient is not currently taking opioid prescriptions. Functional ability and status Nutritional status Physical activity Advanced directives List of other physicians Hospitalizations, surgeries, and ER visits in previous 12 months Vitals Screenings to include cognitive, depression, and falls Referrals and appointments  In addition, I have reviewed and discussed with patient certain preventive protocols, quality metrics, and best practice recommendations. A written personalized care plan for preventive services as well as general preventive health recommendations were provided to patient.     Daphane Shepherd, LPN   [id number]   Nurse Notes: Due Dexa Referral completed

## 2022-02-04 NOTE — Telephone Encounter (Signed)
Meds sent

## 2022-03-12 ENCOUNTER — Telehealth: Payer: Self-pay | Admitting: Internal Medicine

## 2022-03-12 NOTE — Telephone Encounter (Signed)
Received request to do Repatha PA in CMM Attempted to submit Message received:  (Key: [id number])  This medication or product was previously approved on A-24G10_1 from 2022-03-10 to 2023-03-10.

## 2022-03-22 ENCOUNTER — Other Ambulatory Visit: Payer: Self-pay | Admitting: Cardiology

## 2022-03-31 ENCOUNTER — Other Ambulatory Visit: Payer: Self-pay | Admitting: Cardiology

## 2022-03-31 NOTE — Telephone Encounter (Signed)
Prescription refill request for Xarelto received.  Indication: AF Last office visit: 09/04/21 Weight: 81.2kg Age: 82 Scr: 0.98 on 07/25/21 CrCl: 55.76  Based on above findings Xarelto '20mg'$  daily is the appropriate dose.  Refill approved.

## 2022-04-22 ENCOUNTER — Encounter: Payer: Medicare Other | Admitting: Family Medicine

## 2022-04-22 ENCOUNTER — Other Ambulatory Visit: Payer: Medicare Other

## 2022-04-25 ENCOUNTER — Encounter: Payer: Self-pay | Admitting: Cardiology

## 2022-04-25 ENCOUNTER — Telehealth: Payer: Self-pay | Admitting: Cardiology

## 2022-04-25 NOTE — Telephone Encounter (Signed)
Spoke with patient. She will have her daughter take pics when she gets home and send via [place]. Advised her that after 5pm and on the weekend, these messages will likely not be reviewed. Advised will loop in her PCP on this also.

## 2022-04-25 NOTE — Telephone Encounter (Signed)
Pt c/o medication issue:  1. Name of Medication: rivaroxaban (XARELTO) 20 MG TABS tablet   2. How are you currently taking this medication (dosage and times per day)? TAKE ONE TABLET BY MOUTH DAILY WITH SUPPER   3. Are you having a reaction (difficulty breathing--STAT)? no  4. What is your medication issue? Pt thinks she may be having a reaction to medication due to her having spots on her chest, arms and back. Pt would like a call back regarding this matter.

## 2022-04-25 NOTE — Telephone Encounter (Signed)
Spoke with patient of Dr. Percival Spanish who called in with concerns of possible reaction to xarelto. She reports she has sores on her neck and legs, a few on arms. She said the areas are not itchy. She reports this has been present for about 1 week. She was started Xarelto June 2023 after a reported rash on Eliquis, per MD note. Advised it may be unlikely this issue is due to the medication since it has been >6 months since initiation. She reports no contact with environmental allergens. Advised will send to MD/PharmD team to review.

## 2022-04-25 NOTE — Telephone Encounter (Signed)
See if we can get her in on DOD for eval.

## 2022-04-25 NOTE — Telephone Encounter (Signed)
Is she able to send pictures?  Regardless, should schedule with PCP to rule out other causes

## 2022-04-25 NOTE — Telephone Encounter (Signed)
error 

## 2022-04-28 DIAGNOSIS — L308 Other specified dermatitis: Secondary | ICD-10-CM | POA: Diagnosis not present

## 2022-04-28 DIAGNOSIS — I872 Venous insufficiency (chronic) (peripheral): Secondary | ICD-10-CM | POA: Diagnosis not present

## 2022-04-28 DIAGNOSIS — L281 Prurigo nodularis: Secondary | ICD-10-CM | POA: Diagnosis not present

## 2022-04-28 NOTE — Telephone Encounter (Signed)
FYI. Apparently from soap.

## 2022-04-28 NOTE — Telephone Encounter (Signed)
Patient saw dermatologist today who thinks it may be an allergic reaction to the soap she is using.  He has given her some prescriptions and she will be stopping the soap.

## 2022-05-08 ENCOUNTER — Telehealth: Payer: Self-pay | Admitting: Cardiology

## 2022-05-08 NOTE — Telephone Encounter (Signed)
Daughter called in to say that patient is breakout in to sores because of the medication. Daughter would like to see what other medication there is. Please advise

## 2022-05-12 DIAGNOSIS — L570 Actinic keratosis: Secondary | ICD-10-CM | POA: Diagnosis not present

## 2022-05-12 DIAGNOSIS — X32XXXD Exposure to sunlight, subsequent encounter: Secondary | ICD-10-CM | POA: Diagnosis not present

## 2022-05-12 DIAGNOSIS — B078 Other viral warts: Secondary | ICD-10-CM | POA: Diagnosis not present

## 2022-05-12 DIAGNOSIS — L308 Other specified dermatitis: Secondary | ICD-10-CM | POA: Diagnosis not present

## 2022-05-13 ENCOUNTER — Telehealth: Payer: Self-pay | Admitting: Family Medicine

## 2022-05-13 NOTE — Telephone Encounter (Signed)
Called and r/s to Wednesday 05/21/22

## 2022-05-13 NOTE — Telephone Encounter (Signed)
Needs to r/s her DEXA scan. Please call back

## 2022-05-14 ENCOUNTER — Other Ambulatory Visit: Payer: Medicare Other

## 2022-05-21 ENCOUNTER — Telehealth: Payer: Self-pay | Admitting: Family Medicine

## 2022-05-21 ENCOUNTER — Other Ambulatory Visit: Payer: Medicare Other

## 2022-05-21 NOTE — Telephone Encounter (Signed)
Needs to r/s DEXA

## 2022-05-26 ENCOUNTER — Telehealth: Payer: Self-pay | Admitting: Cardiology

## 2022-05-26 DIAGNOSIS — L308 Other specified dermatitis: Secondary | ICD-10-CM | POA: Diagnosis not present

## 2022-05-26 NOTE — Telephone Encounter (Signed)
Pt c/o medication issue:  1. Name of Medication: rivaroxaban (XARELTO) 20 MG TABS tablet   2. How are you currently taking this medication (dosage and times per day)? 1 tablet daily  3. Are you having a reaction (difficulty breathing--STAT)? no  4. What is your medication issue? Patient states the xarelto is not working. She says it makes her bleed. She says she bleeds every day and it is getting to be a problem. She says she was switched from repatha to xarelto, because xarelto is cheaper.

## 2022-05-26 NOTE — Telephone Encounter (Signed)
Appt made for 06/25/22 patient aware

## 2022-05-26 NOTE — Telephone Encounter (Signed)
Called patient, she states that she is having issues with the Xarelto- she has spots that have came up all over her skin, like blood spots. She states they are all over her arms, and legs- she has a place on her leg that just bleeds, she states she did not hit her leg or bump it against anything it just bleeds. She was originally on Eliquis and reports no problem, however previous message states she had a rash with it, but patient denies this states that she was not in a place to purchase this medication during that time. She would like to know if she can switch back to the Eliquis to see if these blood spots improve.   Patient states that she feels fine other than those spots on her arms, they do not itch or cause pain.   Will route to MD to review.  Thanks!

## 2022-05-29 ENCOUNTER — Telehealth: Payer: Self-pay | Admitting: Cardiology

## 2022-05-29 MED ORDER — APIXABAN 5 MG PO TABS: 5.0000 mg | ORAL_TABLET | Freq: Two times a day (BID) | ORAL | 3 refills | Status: DC

## 2022-05-29 NOTE — Telephone Encounter (Signed)
Spoke with pt, aware of the recommendations. New script sent to the pharmacy  

## 2022-05-29 NOTE — Telephone Encounter (Signed)
*  STAT* If patient is at the pharmacy, call can be transferred to refill team.   1. Which medications need to be refilled? (please list name of each medication and dose if known) nitroGLYCERIN (NITROSTAT) 0.4 MG SL tablet PLACE 1 TABLET UNDER THE TONGUE AT ONSET OF CHEST PAIN EVERY 5 MINTUES UP TO 3 TIMES AS NEEDED  2. Which pharmacy/location (including street and city if local pharmacy) is medication to be sent to?[place], [place] - [address]   3. Do they need a 30 day or 90 day supply? 25 Tablets

## 2022-05-30 MED ORDER — NITROGLYCERIN 0.4 MG SL SUBL: SUBLINGUAL_TABLET | SUBLINGUAL | 0 refills | Status: AC

## 2022-05-30 NOTE — Telephone Encounter (Signed)
Refill for Nitroglycerin has been sent to [facility].

## 2022-06-09 ENCOUNTER — Other Ambulatory Visit: Payer: Self-pay | Admitting: Cardiology

## 2022-06-09 ENCOUNTER — Other Ambulatory Visit: Payer: Self-pay | Admitting: Family Medicine

## 2022-06-21 ENCOUNTER — Other Ambulatory Visit: Payer: Self-pay | Admitting: Internal Medicine

## 2022-06-25 ENCOUNTER — Other Ambulatory Visit: Payer: Medicare Other

## 2022-07-11 ENCOUNTER — Telehealth: Payer: Self-pay | Admitting: Cardiology

## 2022-07-11 DIAGNOSIS — L309 Dermatitis, unspecified: Secondary | ICD-10-CM | POA: Diagnosis not present

## 2022-07-11 NOTE — Telephone Encounter (Signed)
Returned call to pt. She states that she had 2 "little scabs on her arm" and she must have picked them off and now "they won't stop bleeding. Pt denies any trauma. Pt states that she will hold it for awhile then go do some chores and has already bled thru a few bandages. Informed pt to sit down and apply vigorous pressure for 3-5 minutes and this should get it to stop bleeding. Then bandage the area up. If it does not stop bleeding go to an urgent care or [facility] to see what to do next. If needed call our after hours team. Verbalized understanding.

## 2022-07-11 NOTE — Telephone Encounter (Signed)
Pt c/o medication issue:  1. Name of Medication: apixaban (ELIQUIS) 5 MG TABS tablet   2. How are you currently taking this medication (dosage and times per day)? As prescribed  3. Are you having a reaction (difficulty breathing--STAT)? Yes  4. What is your medication issue? Patient has a place on her arm that will not stop bleeding that has been on going since this morning. She reports she has a bandage on it that she has to continue changing. Please advise.

## 2022-07-14 ENCOUNTER — Other Ambulatory Visit: Payer: Self-pay | Admitting: Cardiology

## 2022-07-18 ENCOUNTER — Ambulatory Visit: Payer: Medicare Other | Admitting: Family Medicine

## 2022-08-25 ENCOUNTER — Ambulatory Visit (INDEPENDENT_AMBULATORY_CARE_PROVIDER_SITE_OTHER): Payer: Medicare Other

## 2022-08-25 ENCOUNTER — Encounter: Payer: Self-pay | Admitting: Family Medicine

## 2022-08-25 ENCOUNTER — Ambulatory Visit (INDEPENDENT_AMBULATORY_CARE_PROVIDER_SITE_OTHER): Payer: Medicare Other | Admitting: Family Medicine

## 2022-08-25 VITALS — BP 136/88 | HR 127 | Temp 98.8°F | Ht 63.0 in | Wt 177.0 lb

## 2022-08-25 DIAGNOSIS — M8589 Other specified disorders of bone density and structure, multiple sites: Secondary | ICD-10-CM | POA: Diagnosis not present

## 2022-08-25 DIAGNOSIS — E039 Hypothyroidism, unspecified: Secondary | ICD-10-CM

## 2022-08-25 DIAGNOSIS — I480 Paroxysmal atrial fibrillation: Secondary | ICD-10-CM

## 2022-08-25 DIAGNOSIS — R6889 Other general symptoms and signs: Secondary | ICD-10-CM | POA: Diagnosis not present

## 2022-08-25 DIAGNOSIS — G720 Drug-induced myopathy: Secondary | ICD-10-CM

## 2022-08-25 DIAGNOSIS — I700 Atherosclerosis of aorta: Secondary | ICD-10-CM

## 2022-08-25 DIAGNOSIS — Z0000 Encounter for general adult medical examination without abnormal findings: Secondary | ICD-10-CM

## 2022-08-25 DIAGNOSIS — Z780 Asymptomatic menopausal state: Secondary | ICD-10-CM

## 2022-08-25 DIAGNOSIS — I739 Peripheral vascular disease, unspecified: Secondary | ICD-10-CM

## 2022-08-25 DIAGNOSIS — Z0001 Encounter for general adult medical examination with abnormal findings: Secondary | ICD-10-CM | POA: Diagnosis not present

## 2022-08-25 DIAGNOSIS — I2510 Atherosclerotic heart disease of native coronary artery without angina pectoris: Secondary | ICD-10-CM

## 2022-08-25 DIAGNOSIS — I10 Essential (primary) hypertension: Secondary | ICD-10-CM | POA: Diagnosis not present

## 2022-08-25 DIAGNOSIS — E785 Hyperlipidemia, unspecified: Secondary | ICD-10-CM | POA: Diagnosis not present

## 2022-08-25 DIAGNOSIS — R413 Other amnesia: Secondary | ICD-10-CM | POA: Diagnosis not present

## 2022-08-25 DIAGNOSIS — F4321 Adjustment disorder with depressed mood: Secondary | ICD-10-CM

## 2022-08-25 DIAGNOSIS — R739 Hyperglycemia, unspecified: Secondary | ICD-10-CM | POA: Diagnosis not present

## 2022-08-25 DIAGNOSIS — Z23 Encounter for immunization: Secondary | ICD-10-CM

## 2022-08-25 MED ORDER — AMLODIPINE BESYLATE 5 MG PO TABS: 5.0000 mg | ORAL_TABLET | Freq: Every day | ORAL | 3 refills | Status: DC

## 2022-08-25 MED ORDER — LEVOTHYROXINE SODIUM 50 MCG PO TABS: 50.0000 ug | ORAL_TABLET | Freq: Every day | ORAL | 3 refills | Status: DC

## 2022-08-25 MED ORDER — METOPROLOL TARTRATE 100 MG PO TABS: 100.0000 mg | ORAL_TABLET | Freq: Two times a day (BID) | ORAL | 0 refills | Status: DC

## 2022-08-25 NOTE — Progress Notes (Signed)
BOBBIE ROBECK is a 82 y.o. female presents to office today for annual physical exam examination.    Concerns today include: 1. Memory changes Patient is accompanied today's visit by her granddaughter.  She notes that over the last several months they have noticed some difficulty with short-term memory.  There is a strong family history of Alzheimer's dementia and they want her evaluated for this today.  Patient admits that sometimes she is forgetful but this seems to coincide with the passing of her spouse.  He passed away from complications of Parkinson's disease.  She resides with her granddaughter in an apartment that is attached to their home.  She has a chairlift but ambulates up and down stairs without difficulty as long she does not have anything in her hands.  2.  Rash Patient reports a rash that has affected her legs, arms and chest.  She was seen by dermatology and told that this was a stress rash and prescribed cream.  She is not sure exactly what cream she is using however.  She does not report any pain.  She denies picking at it but her granddaughter disagrees and feels like she does pick at it.  3.  Atrial fibrillation Patient is compliant with all medications.  She admits that her heart has been running a little faster as of late.  She has not yet reached out to her cardiologist.  She reports no bleeding.  No chest pain.  No shortness of breath or swelling of the legs outside of baseline.  No change in exercise tolerance.  Occupation: retired, Marital status: widowed, Substance use: none Diet: typical [place], Exercise: no structured Immunizations needed: Immunization History  Administered Date(s) Administered   Fluad Quad(high Dose 65+) 11/17/2018, 12/09/2019, 12/11/2020, 12/09/2021   Influenza, High Dose Seasonal PF 12/07/2015, 12/16/2016, 12/21/2017   Influenza,inj,Quad PF,6+ Mos 12/21/2012, 01/04/2014, 12/25/2014   Moderna Covid-19 Vaccine Bivalent Booster 49yrs & up  12/11/2020   PFIZER(Purple Top)SARS-COV-2 Vaccination 03/30/2019, 06/25/2019, 11/29/2019, 08/02/2020   Pneumococcal Conjugate-13 06/28/2014   Pneumococcal Polysaccharide-23 11/04/2012   Tdap 10/09/2010   Zoster Recombinat (Shingrix) 03/28/2021, 05/27/2021   Zoster, Live 07/15/2006     Past Medical History:  Diagnosis Date   AAA (abdominal aortic aneurysm) [facility])    cardiology aware    Arthritis    OA lt knee- cortizone inj. q 4 months AND OA ALSO IN BACK. Had knee replaced-issue resolved   CAD (coronary artery disease) CARDIOLOGIST- DR [facility]--  VISIT 01-02-10 IN EPIC   1993-- PTCA. Pt describes a near total blockage of apparently the LAD and a 65% stenosis elsewhere.   Cancer [facility])    History of bladder cancer followed by dr Vernie Ammons   hx  TCC of bladder ,  Ta G3-  first occurence 02-20-2012--  s/p BCG tx's  and  mitomycin C   Hyperlipidemia 1993   Hypertension 1993   Nocturia    Osteopenia    Sigmoid diverticulosis    Social History   Socioeconomic History   Marital status: Married    Spouse name: Fayrene Fearing   Number of children: 3   Years of education: 12   Highest education level: High school graduate  Occupational History   Occupation: retired  Tobacco Use   Smoking status: Former    Packs/day: 0.50    Years: 5.00    Additional pack years: 0.00    Total pack years: 2.50    Types: Cigarettes    Quit date: 03/09/1976    Years  since quitting: 46.4   Smokeless tobacco: Never  Vaping Use   Vaping Use: Never used  Substance and Sexual Activity   Alcohol use: Yes    Alcohol/week: 4.0 standard drinks of alcohol    Types: 4 Glasses of wine per week    Comment: red wine   Drug use: No   Sexual activity: Not Currently  Other Topics Concern   Not on file  Social History Narrative   Married with 3 children and 4 grandchildren.  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 with Parkinsons   Lives in garage apartment at her daughters home.   Social Determinants of Health   Financial  Resource Strain: Low Risk  (02/04/2022)   Overall Financial Resource Strain (CARDIA)    Difficulty of Paying Living Expenses: Not hard at all  Food Insecurity: No Food Insecurity (02/04/2022)   Hunger Vital Sign    Worried About Running Out of Food in the Last Year: Never true    Ran Out of Food in the Last Year: Never true  Transportation Needs: No Transportation Needs (02/04/2022)   PRAPARE - Administrator, Civil Service (Medical): No    Lack of Transportation (Non-Medical): No  Physical Activity: Insufficiently Active (02/04/2022)   Exercise Vital Sign    Days of Exercise per Week: 3 days    Minutes of Exercise per Session: 30 min  Stress: No Stress Concern Present (02/04/2022)   [employer] of Occupational Health - Occupational Stress Questionnaire    Feeling of Stress : Not at all  Social Connections: Moderately Integrated (02/04/2022)   Social Connection and Isolation Panel [NHANES]    Frequency of Communication with Friends and Family: More than three times a week    Frequency of Social Gatherings with Friends and Family: More than three times a week    Attends Religious Services: 1 to 4 times per year    Active Member of [employer] or Organizations: No    Attends Banker Meetings: Never    Marital Status: Married  Catering manager Violence: Not At Risk (02/04/2022)   Humiliation, Afraid, Rape, and Kick questionnaire    Fear of Current or Ex-Partner: No    Emotionally Abused: No    Physically Abused: No    Sexually Abused: No   Past Surgical History:  Procedure Laterality Date   CHOLECYSTECTOMY  2003 (approx)   CLIPPING OF ATRIAL APPENDAGE N/A 04/25/2019   Procedure: Clipping Of Atrial Appendage;  Surgeon: Corliss Skains, MD;  Location: [facility] OR;  Service: Open Heart Surgery;  Laterality: N/A;   COLONOSCOPY  05-31-2003   CORONARY ANGIOPLASTY  1993   to LAD   CORONARY ARTERY BYPASS GRAFT N/A 04/25/2019   Procedure: CORONARY ARTERY BYPASS  GRAFTING (CABG) times four using left internal mammary artery and left greater saphenous vein harvested endoscipically.;  Surgeon: Corliss Skains, MD;  Location: [facility] OR;  Service: Open Heart Surgery;  Laterality: N/A;   CYSTOSCOPY WITH BIOPSY  05/09/2011   Procedure: CYSTOSCOPY WITH BIOPSY;  Surgeon: Garnett Farm, MD;  Location: [facility];  Service: Urology;  Laterality: N/A;  WITH BLADDER BIOPSY GYRUS   LEFT HEART CATH AND CORONARY ANGIOGRAPHY N/A 04/19/2019   Procedure: LEFT HEART CATH AND CORONARY ANGIOGRAPHY;  Surgeon: Marykay Lex, MD;  Location: [facility] INVASIVE CV LAB;  Service: Cardiovascular;  Laterality: N/A;   TOTAL HIP ARTHROPLASTY Right 12/25/2015   Procedure: RIGHT TOTAL HIP ARTHROPLASTY ANTERIOR APPROACH;  Surgeon: Durene Romans, MD;  Location: [facility];  Service: Orthopedics;  Laterality: Right;   TOTAL KNEE ARTHROPLASTY Left 11/22/2012   Procedure: LEFT TOTAL KNEE ARTHROPLASTY;  Surgeon: Shelda Pal, MD;  Location: [facility] ORS;  Service: Orthopedics;  Laterality: Left;   TRANSURETHRAL RESECTION OF BLADDER TUMOR  03/14/2011   Procedure: TRANSURETHRAL RESECTION OF BLADDER TUMOR (TURBT);  Surgeon: Garnett Farm, MD;  Location: [facility];  Service: Urology;  Laterality: N/A;   TRANSURETHRAL RESECTION OF BLADDER TUMOR  05/09/2011   Procedure: TRANSURETHRAL RESECTION OF BLADDER TUMOR (TURBT);  Surgeon: Garnett Farm, MD;  Location: [facility];  Service: Urology;  Laterality: N/A;   TRANSURETHRAL RESECTION OF BLADDER TUMOR  03/08/2012   Procedure: TRANSURETHRAL RESECTION OF BLADDER TUMOR (TURBT);  Surgeon: Garnett Farm, MD;  Location: [facility];  Service: Urology;  Laterality: N/A;   TRANSURETHRAL RESECTION OF BLADDER TUMOR WITH GYRUS (TURBT-GYRUS) N/A 03/24/2014   Procedure: TRANSURETHRAL RESECTION OF BLADDER TUMOR WITH GYRUS (TURBT-GYRUS);  Surgeon: Garnett Farm, MD;  Location: [facility];  Service: Urology;   Laterality: N/A;   UMBILICAL HERNIA REPAIR  2001  (approx)   Family History  Problem Relation Age of Onset   Coronary artery disease Father 28   Hyperlipidemia Father    Heart attack Mother 69   Hyperlipidemia Mother    Osteoporosis Sister    Hyperlipidemia Sister    Heart attack Daughter 61       CABG x 5   Heart disease Son 16       Stents x 3    Current Outpatient Medications:    Evolocumab (REPATHA SURECLICK) 140 MG/ML SOAJ, INJECT 140mg  (1ml) EVERY TWO WEEKS AS DIRECTED, Disp: 2 mL, Rfl: 11   acetaminophen (TYLENOL) 500 MG tablet, Take 2 tablets (1,000 mg total) by mouth every 8 (eight) hours., Disp: 30 tablet, Rfl: 0   amLODipine (NORVASC) 5 MG tablet, Take 1 tablet (5 mg total) by mouth daily., Disp: 90 tablet, Rfl: 3   apixaban (ELIQUIS) 5 MG TABS tablet, Take 1 tablet (5 mg total) by mouth 2 (two) times daily., Disp: 180 tablet, Rfl: 3   aspirin EC 81 MG EC tablet, Take 1 tablet (81 mg total) by mouth daily., Disp:  , Rfl:    cholecalciferol (VITAMIN D) 1000 UNITS tablet, Take 1,000 Units by mouth daily after breakfast. , Disp: , Rfl:    Co-Enzyme Q10 200 MG CAPS, Take 200 mg by mouth daily after breakfast. , Disp: , Rfl:    digoxin (LANOXIN) 0.125 MG tablet, TAKE ONE TABLET BY MOUTH DAILY, Disp: 90 tablet, Rfl: 2   Flaxseed, Linseed, (FLAX SEED OIL) 1000 MG CAPS, Take 1,000 mg by mouth daily after breakfast. , Disp: , Rfl:    fluconazole (DIFLUCAN) 150 MG tablet, Take 1 tablet (150 mg total) by mouth every three (3) days as needed., Disp: 3 tablet, Rfl: 0   GNP VITAMIN B-12 500 MCG tablet, TAKE 1 TABLET DAILY, Disp: 90 tablet, Rfl: 0   hydrocortisone 1 % lotion, Apply 1 application. topically 2 (two) times daily., Disp: 118 mL, Rfl: 0   levothyroxine (SYNTHROID) 50 MCG tablet, Take 1 tablet (50 mcg total) by mouth daily. (NEEDS TO BE SEEN BEFORE NEXT REFILL), Disp: 90 tablet, Rfl: 0   Magnesium 300 MG CAPS, Take 300 mg by mouth at bedtime. , Disp: , Rfl:    metoprolol  tartrate (LOPRESSOR) 50 MG tablet, Take 1.5 tablets (75 mg total) by mouth 2 (two) times daily.,  Disp: 270 tablet, Rfl: 0   nitroGLYCERIN (NITROSTAT) 0.4 MG SL tablet, PLACE 1 TABLET UNDER THE TONGUE AT ONSET OF CHEST PAIN EVERY 5 MINTUES UP TO 3 TIMES AS NEEDED, Disp: 25 tablet, Rfl: 0   traMADol (ULTRAM) 50 MG tablet, Take by mouth every 6 (six) hours as needed., Disp: , Rfl:    TURMERIC PO, Take 1 capsule by mouth daily after breakfast., Disp: , Rfl:  No current facility-administered medications for this visit.  Facility-Administered Medications Ordered in Other Visits:    DOBUTamine (DOBUTREX) 1,000 mcg/mL in dextrose 5% 250 mL infusion, 20 mcg/kg/min, Intravenous, Continuous, Lars Masson, MD, Last Rate: 96.4 mL/hr at 12/24/15 0840, 20 mcg/kg/min at 12/24/15 0840  Allergies  Allergen Reactions   Fish Allergy Hives, Shortness Of Breath and Other (See Comments)    "Bottom-feeder fish"   Fish-Derived Products Hives, Shortness Of Breath and Other (See Comments)    Bottom-feeder fish   Shellfish-Derived Products Hives and Shortness Of Breath   Lasix [Furosemide] Swelling and Other (See Comments)    Edema, elevated BP   Codeine Rash   Latex Itching and Rash    Reports Elastic in underwear, under wire bras, and now surgical cap cause rash and itching.    Macrodantin [Nitrofurantoin Macrocrystal] Other (See Comments)    Unknown reaction    Niacin Other (See Comments)    Muscle aches   Niaspan [Niacin Er] Other (See Comments)    Muscle aches   Statins Other (See Comments)    Muscle aches/ cramps   Sulfa Antibiotics Rash and Other (See Comments)    Muscle aches/ cramps, also   Vicodin [Hydrocodone-Acetaminophen] Nausea And Vomiting and Other (See Comments)    SEVERE N/V   Zetia [Ezetimibe] Other (See Comments)    Muscle aches     ROS: Review of Systems Pertinent items noted in HPI and remainder of comprehensive ROS otherwise negative.    Physical exam BP 136/88   Pulse (!)  127   Temp 98.8 F (37.1 C)   Ht 5\' 3"  (1.6 m)   Wt 177 lb (80.3 kg)   SpO2 93%   BMI 31.35 kg/m  General appearance: alert, cooperative, appears stated age, and no distress Head: Normocephalic, without obvious abnormality, atraumatic Eyes: conjunctivae/corneas clear. PERRL, EOM's intact.  Ears: normal TM's and external ear canals both ears Nose: Nares normal. Septum midline. Mucosa normal. No drainage or sinus tenderness. Throat: lips, mucosa, and tongue normal; teeth and gums normal Neck: no adenopathy, supple, symmetrical, trachea midline, and thyroid not enlarged, symmetric, no tenderness/mass/nodules Back: symmetric, no curvature. ROM normal. No CVA tenderness. Lungs: clear to auscultation bilaterally Heart:  irregularly irregular with tachycardic rate Abdomen: soft, non-tender; bowel sounds normal; no masses,  no organomegaly Extremities: extremities normal, atraumatic, no cyanosis or edema Pulses: 2+ and symmetric Skin:  Stasis dermatitis noted along bilateral lower extremities.  She has several areas of hyperpigmentation.  Several welts noted along the chest and arms Lymph nodes: Cervical, supraclavicular, and axillary nodes normal. Neurologic: Grossly normal     08/25/2022   11:52 AM 12/01/2017    8:17 AM  MMSE - Mini Mental State Exam  Orientation to time 4 5  Orientation to Place 5 5  Registration 2 3  Attention/ Calculation 5 5  Recall 3 3  Language- name 2 objects 2 2  Language- repeat 1 1  Language- follow 3 step command 3 3  Language- read & follow direction 1 1  Write a sentence 1 1  Copy design 1 1  Total score 28 30       08/25/2022   11:32 AM 01/03/2022    8:21 AM 07/25/2021    1:44 PM  Depression screen PHQ 2/9  Decreased Interest 0 0 0  Down, Depressed, Hopeless 0 0 0  PHQ - 2 Score 0 0 0  Altered sleeping 0 0   Tired, decreased energy 0 0   Change in appetite 0 0   Feeling bad or failure about yourself  0 0   Trouble concentrating 0 0    Moving slowly or fidgety/restless 0 0   Suicidal thoughts 0 0   PHQ-9 Score 0 0   Difficult doing work/chores Not difficult at all Not difficult at all        Assessment/ Plan: Caleb Popp here for annual physical exam.   Annual physical exam  Paroxysmal atrial fibrillation (HCC) - Plan: CMP14+EGFR, Magnesium, CBC, Digoxin level, metoprolol tartrate (LOPRESSOR) 100 MG tablet, amLODipine (NORVASC) 5 MG tablet  Hypertension - Plan: CMP14+EGFR  Hyperlipidemia LDL goal <70 - Plan: CMP14+EGFR, Lipid Panel, TSH  Aortic atherosclerosis (HCC) - Plan: CMP14+EGFR, Lipid Panel, TSH  Statin myopathy  Atherosclerosis of native coronary artery of native heart without angina pectoris - Plan: CMP14+EGFR, Lipid Panel, TSH  Peripheral vascular insufficiency (HCC)  Acquired hypothyroidism - Plan: TSH, T4, Free, levothyroxine (SYNTHROID) 50 MCG tablet  Short-term memory loss - Plan: Ambulatory referral to Neurology, Vitamin B12  Grief reaction  She had a higher heart rate than I felt comfortable with today.  I have subsequently advanced her Lopressor to 100 mg twice daily and reach out to Dr. Antoine Poche for further instruction.  I did advise her to go ahead and make an appoint with him to follow-up on this issue.  In the meantime, we will check magnesium level, thyroid levels, digoxin, electrolytes and CBC.  Blood pressure normal. No changes  Fasting lipid, liver function test collected today.  Continue current dietary regimen to prevent progression of aortic atherosclerosis as she has intolerance to statins, Zetia, niacin  We placed her in Unna boots today because she had some venous stasis dermatitis appreciated on exam.  We discussed okay to remove in the next 1 to 2 days, sooner if concerns arise.  Her granddaughter will message me with what cream she is being prescribed  Check thyroid levels given tachycardia  MMSE score did not demonstrate dementia today.  I do question if some of  this may be related to the passing of her spouse and perhaps the trauma response.  However they did request further evaluation given strong family history of Alzheimer's dementia and I think this is totally reasonable so referral to neurology has been placed.  Check B12 levels  Offered counseling services but patient declined this today  Counseled on healthy lifestyle choices, including diet (rich in fruits, vegetables and lean meats and low in salt and simple carbohydrates) and exercise (at least 30 minutes of moderate physical activity daily).  Patient to follow up in 24m  Cienna Dumais M. Nadine Counts, DO

## 2022-08-25 NOTE — Patient Instructions (Addendum)
I am increasing your metoprolol to 100mg  twice daily (the pill will look different) until you see Dr Antoine Poche.  Your Heart rate is just a little too fast today.  I've reached out to him to see if you might be seen soon for recheck with him. Referral to neurology placed.  Your memory test was normal for your age today but they may offer more extensive testing I will also add some specialty labs to your blood work today We wrapped your legs in "unna boots" to help the skin/ swelling We talked about walking regularly to improve blood flow to those legs If you decide you want to see a counselor at all, please do not hesitate to contact me.

## 2022-08-26 ENCOUNTER — Encounter: Payer: Self-pay | Admitting: Physician Assistant

## 2022-08-27 LAB — SPECIMEN STATUS REPORT

## 2022-08-28 LAB — VITAMIN D 25 HYDROXY (VIT D DEFICIENCY, FRACTURES): Vit D, 25-Hydroxy: 38.3 ng/mL (ref 30.0–100.0)

## 2022-08-28 LAB — HGB A1C W/O EAG: Hgb A1c MFr Bld: 6.3 % — ABNORMAL HIGH (ref 4.8–5.6)

## 2022-09-01 LAB — CMP14+EGFR
ALT: 19 IU/L (ref 0–32)
AST: 21 IU/L (ref 0–40)
Albumin: 4.7 g/dL (ref 3.7–4.7)
Alkaline Phosphatase: 85 IU/L (ref 44–121)
BUN/Creatinine Ratio: 16 (ref 12–28)
BUN: 14 mg/dL (ref 8–27)
Bilirubin Total: 0.4 mg/dL (ref 0.0–1.2)
CO2: 22 mmol/L (ref 20–29)
Calcium: 9.7 mg/dL (ref 8.7–10.3)
Chloride: 104 mmol/L (ref 96–106)
Creatinine, Ser: 0.87 mg/dL (ref 0.57–1.00)
Globulin, Total: 2.9 g/dL (ref 1.5–4.5)
Glucose: 124 mg/dL — ABNORMAL HIGH (ref 70–99)
Potassium: 4.5 mmol/L (ref 3.5–5.2)
Sodium: 142 mmol/L (ref 134–144)
Total Protein: 7.6 g/dL (ref 6.0–8.5)
eGFR: 66 mL/min/{1.73_m2} (ref >59)

## 2022-09-01 LAB — CBC
Hematocrit: 42.2 % (ref 34.0–46.6)
Hemoglobin: 14.1 g/dL (ref 11.1–15.9)
MCH: 32.1 pg (ref 26.6–33.0)
MCHC: 33.4 g/dL (ref 31.5–35.7)
MCV: 96 fL (ref 79–97)
Platelets: 243 10*3/uL (ref 150–450)
RBC: 4.39 x10E6/uL (ref 3.77–5.28)
RDW: 13.3 % (ref 11.7–15.4)
WBC: 9.9 10*3/uL (ref 3.4–10.8)

## 2022-09-01 LAB — DIGOXIN LEVEL: Digoxin, Serum: <0.4 ng/mL — ABNORMAL LOW (ref 0.5–0.9)

## 2022-09-01 LAB — T4, FREE: Free T4: 1.31 ng/dL (ref 0.82–1.77)

## 2022-09-01 LAB — LIPID PANEL
Chol/HDL Ratio: 2.3 ratio (ref 0.0–4.4)
Cholesterol, Total: 114 mg/dL (ref 100–199)
HDL: 49 mg/dL (ref >39)
LDL Chol Calc (NIH): 33 mg/dL (ref 0–99)
Triglycerides: 205 mg/dL — ABNORMAL HIGH (ref 0–149)
VLDL Cholesterol Cal: 32 mg/dL (ref 5–40)

## 2022-09-01 LAB — MAGNESIUM: Magnesium: 2.3 mg/dL (ref 1.6–2.3)

## 2022-09-01 LAB — TSH: TSH: 1.75 u[IU]/mL (ref 0.450–4.500)

## 2022-09-01 LAB — VITAMIN B12: Vitamin B-12: >2000 pg/mL — ABNORMAL HIGH (ref 232–1245)

## 2022-09-02 ENCOUNTER — Ambulatory Visit (INDEPENDENT_AMBULATORY_CARE_PROVIDER_SITE_OTHER): Payer: Medicare Other | Admitting: Family Medicine

## 2022-09-02 VITALS — BP 145/81 | HR 89 | Ht 63.0 in | Wt 176.0 lb

## 2022-09-02 DIAGNOSIS — M8580 Other specified disorders of bone density and structure, unspecified site: Secondary | ICD-10-CM

## 2022-09-02 MED ORDER — RISEDRONATE SODIUM 150 MG PO TABS
150.0000 mg | ORAL_TABLET | ORAL | 4 refills | Status: DC
Start: 2022-09-02 — End: 2022-12-12

## 2022-09-02 NOTE — Progress Notes (Signed)
Subjective: CC: Review DEXA scan PCP: Raliegh Ip, DO ZDG:UYQI T Dack is a 82 y.o. female presenting to clinic today for:  1.  Osteopenia with high risk of fracture Patient had bone density scan performed which demonstrated T-score of -2.0 in the left hip.  She had a high risk of fracture by FRAX score and is here today to discuss potential treatment plan.  She currently resides with her daughter, in an over garage apartment.  She is widowed.  She feels like mobility is fairly decent and she tries to maintain independence.  She does take vitamin D supplement.  No reports of peptic ulcers.  Currently treated with Eliquis, amlodipine, Lopressor for atrial fibrillation and CAD.   ROS: Per HPI  Allergies  Allergen Reactions   Fish Allergy Hives, Shortness Of Breath and Other (See Comments)    "Bottom-feeder fish"   Fish-Derived Products Hives, Shortness Of Breath and Other (See Comments)    Bottom-feeder fish   Shellfish-Derived Products Hives and Shortness Of Breath   Lasix [Furosemide] Swelling and Other (See Comments)    Edema, elevated BP   Codeine Rash   Latex Itching and Rash    Reports Elastic in underwear, under wire bras, and now surgical cap cause rash and itching.    Macrodantin [Nitrofurantoin Macrocrystal] Other (See Comments)    Unknown reaction    Niacin Other (See Comments)    Muscle aches   Niaspan [Niacin Er] Other (See Comments)    Muscle aches   Statins Other (See Comments)    Muscle aches/ cramps   Sulfa Antibiotics Rash and Other (See Comments)    Muscle aches/ cramps, also   Vicodin [Hydrocodone-Acetaminophen] Nausea And Vomiting and Other (See Comments)    SEVERE N/V   Zetia [Ezetimibe] Other (See Comments)    Muscle aches   Past Medical History:  Diagnosis Date   AAA (abdominal aortic aneurysm) [facility])    cardiology aware    Arthritis    OA lt knee- cortizone inj. q 4 months AND OA ALSO IN BACK. Had knee replaced-issue resolved   CAD  (coronary artery disease) CARDIOLOGIST- [facility]--  VISIT 01-02-10 IN EPIC   1993-- PTCA. Pt describes a near total blockage of apparently the LAD and a 65% stenosis elsewhere.   [facility])    History of bladder cancer followed by dr Vernie Ammons   hx  TCC of bladder ,  Ta G3-  first occurence 02-20-2012--  s/p BCG tx's  and  mitomycin C   Hyperlipidemia 1993   Hypertension 1993   Nocturia    Osteopenia    Sigmoid diverticulosis     Current Outpatient Medications:    acetaminophen (TYLENOL) 500 MG tablet, Take 2 tablets (1,000 mg total) by mouth every 8 (eight) hours., Disp: 30 tablet, Rfl: 0   amLODipine (NORVASC) 5 MG tablet, Take 1 tablet (5 mg total) by mouth daily., Disp: 90 tablet, Rfl: 3   apixaban (ELIQUIS) 5 MG TABS tablet, Take 1 tablet (5 mg total) by mouth 2 (two) times daily., Disp: 180 tablet, Rfl: 3   aspirin EC 81 MG EC tablet, Take 1 tablet (81 mg total) by mouth daily., Disp:  , Rfl:    cholecalciferol (VITAMIN D) 1000 UNITS tablet, Take 1,000 Units by mouth daily after breakfast. , Disp: , Rfl:    Co-Enzyme Q10 200 MG CAPS, Take 200 mg by mouth daily after breakfast. , Disp: , Rfl:    digoxin (LANOXIN) 0.125 MG tablet, TAKE  ONE TABLET BY MOUTH DAILY, Disp: 90 tablet, Rfl: 2   Evolocumab (REPATHA SURECLICK) 140 MG/ML SOAJ, INJECT 140mg  (1ml) EVERY TWO WEEKS AS DIRECTED, Disp: 2 mL, Rfl: 11   Flaxseed, Linseed, (FLAX SEED OIL) 1000 MG CAPS, Take 1,000 mg by mouth daily after breakfast. , Disp: , Rfl:    GNP VITAMIN B-12 500 MCG tablet, TAKE 1 TABLET DAILY, Disp: 90 tablet, Rfl: 0   hydrocortisone 1 % lotion, Apply 1 application. topically 2 (two) times daily., Disp: 118 mL, Rfl: 0   levothyroxine (SYNTHROID) 50 MCG tablet, Take 1 tablet (50 mcg total) by mouth daily., Disp: 100 tablet, Rfl: 3   Magnesium 300 MG CAPS, Take 300 mg by mouth at bedtime. , Disp: , Rfl:    metoprolol tartrate (LOPRESSOR) 100 MG tablet, Take 1 tablet (100 mg total) by mouth 2 (two) times daily.  **Dose change, Disp: 180 tablet, Rfl: 0   nitroGLYCERIN (NITROSTAT) 0.4 MG SL tablet, PLACE 1 TABLET UNDER THE TONGUE AT ONSET OF CHEST PAIN EVERY 5 MINTUES UP TO 3 TIMES AS NEEDED, Disp: 25 tablet, Rfl: 0   traMADol (ULTRAM) 50 MG tablet, Take by mouth every 6 (six) hours as needed., Disp: , Rfl:    TURMERIC PO, Take 1 capsule by mouth daily after breakfast., Disp: , Rfl:  No current facility-administered medications for this visit.  Facility-Administered Medications Ordered in Other Visits:    DOBUTamine (DOBUTREX) 1,000 mcg/mL in dextrose 5% 250 mL infusion, 20 mcg/kg/min, Intravenous, Continuous, Lars Masson, MD, Last Rate: 96.4 mL/hr at 12/24/15 0840, 20 mcg/kg/min at 12/24/15 0840 Social History   Socioeconomic History   Marital status: Married    Spouse name: Fayrene Fearing   Number of children: 3   Years of education: 12   Highest education level: High school graduate  Occupational History   Occupation: retired  Tobacco Use   Smoking status: Former    Packs/day: 0.50    Years: 5.00    Additional pack years: 0.00    Total pack years: 2.50    Types: Cigarettes    Quit date: 03/09/1976    Years since quitting: 46.5   Smokeless tobacco: Never  Vaping Use   Vaping Use: Never used  Substance and Sexual Activity   Alcohol use: Yes    Alcohol/week: 4.0 standard drinks of alcohol    Types: 4 Glasses of wine per week    Comment: red wine   Drug use: No   Sexual activity: Not Currently  Other Topics Concern   Not on file  Social History Narrative   Married with 3 children and 4 grandchildren.  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 with Parkinsons   Lives in garage apartment at her daughters home.   Social Determinants of Health   Financial Resource Strain: Low Risk  (02/04/2022)   Overall Financial Resource Strain (CARDIA)    Difficulty of Paying Living Expenses: Not hard at all  Food Insecurity: No Food Insecurity (02/04/2022)   Hunger Vital Sign    Worried About Running Out of Food in  the Last Year: Never true    Ran Out of Food in the Last Year: Never true  Transportation Needs: No Transportation Needs (02/04/2022)   PRAPARE - Administrator, Civil Service (Medical): No    Lack of Transportation (Non-Medical): No  Physical Activity: Insufficiently Active (02/04/2022)   Exercise Vital Sign    Days of Exercise per Week: 3 days    Minutes of Exercise per Session: 30 min  Stress: No Stress Concern Present (02/04/2022)   [employer] of Occupational Health - Occupational Stress Questionnaire    Feeling of Stress : Not at all  Social Connections: Moderately Integrated (02/04/2022)   Social Connection and Isolation Panel [NHANES]    Frequency of Communication with Friends and Family: More than three times a week    Frequency of Social Gatherings with Friends and Family: More than three times a week    Attends Religious Services: 1 to 4 times per year    Active Member of [employer] or Organizations: No    Attends Banker Meetings: Never    Marital Status: Married  Catering manager Violence: Not At Risk (02/04/2022)   Humiliation, Afraid, Rape, and Kick questionnaire    Fear of Current or Ex-Partner: No    Emotionally Abused: No    Physically Abused: No    Sexually Abused: No   Family History  Problem Relation Age of Onset   Coronary artery disease Father 17   Hyperlipidemia Father    Heart attack Mother 58   Hyperlipidemia Mother    Osteoporosis Sister    Hyperlipidemia Sister    Heart attack Daughter 61       CABG x 5   Heart disease Son 26       Stents x 3    Objective: Office vital signs reviewed. BP (!) 145/81   Pulse 89   Ht 5\' 3"  (1.6 m)   Wt 176 lb (79.8 kg)   SpO2 95%   BMI 31.18 kg/m   Physical Examination:  General: Awake, alert, well nourished, No acute distress HEENT: sclera white, MMM MSK: Ambulating independently with normal gait and station  DEXA: Narrative & Impression  EXAM: DUAL X-RAY ABSORPTIOMETRY  (DXA) FOR BONE MINERAL DENSITY 08/25/2022 2:26 pm   CLINICAL DATA:  82 year old Female Postmenopausal. post menopause   TECHNIQUE: An axial (e.g., hips, spine) and/or appendicular (e.g., radius) exam was performed, as appropriate, using [employer] systems engineer at [facility]. Images are obtained for bone mineral density measurement and are not obtained for diagnostic purposes. [id number]   Exclusions: Right hip due to hip replacement.   COMPARISON:  01/27/2020   FINDINGS: Scan quality: Good.   LUMBAR SPINE (L1-L4):   BMD (in g/cm2): 1.058   T-score: -1.1   Z-score: 0.3   Rate of change from previous exam: -3.8 %   LEFT FEMORAL NECK:   BMD (in g/cm2): 0.759   T-score: -2.0   Z-score: 0.0   LEFT TOTAL HIP:   BMD (in g/cm2): 0.803   T-score: -1.6   Z-score: 0.2   Rate of change from previous exam: -6.2 %   FRAX 10-YEAR PROBABILITY OF FRACTURE:   10-year fracture risk is performed using the [employer] FRAX calculator based on patient-reported risk factors.   Major osteoporotic fracture: 15.4% Hip fracture: 4.7%   Other situations known to alter the reliability of the FRAX score should be considered when making treatment decisions, including chronic glucocorticoid use and past treatments. Further guidance on treatment can be found at the [facility] Osteoporosis Foundation's website [email or web address].patton.[email or web address]   IMPRESSION: Osteopenia based on BMD.   Fracture risk is increased. Increased risk is based on low BMD, and FRAX calculation.   Assessment/ Plan: 82 y.o. female   Osteopenia with high risk of fracture - Plan: risedronate (ACTONEL) 150 MG tablet  We reviewed risk versus benefits of medications and potential side effects.  Discussed use of bisphosphonate,  Prolia, strength bearing exercises and adequate vitamin D and calcium intake.  Handout was provided today.  Will proceed with once monthly Actonel.  Her  vitamin D and calcium levels were appropriate.  We discussed that likely treatment outcomes would not be evident for at least a couple of years, at which point we will repeat her DEXA scan.  She voiced good understanding will contact me if any concerns arise prior to her next visit  No orders of the defined types were placed in this encounter.  No orders of the defined types were placed in this encounter.    Raliegh Ip, DO [facility] [phone]

## 2022-09-17 ENCOUNTER — Ambulatory Visit (INDEPENDENT_AMBULATORY_CARE_PROVIDER_SITE_OTHER): Payer: Medicare Other | Admitting: Family Medicine

## 2022-09-17 ENCOUNTER — Encounter: Payer: Self-pay | Admitting: Family Medicine

## 2022-09-17 VITALS — BP 130/78 | HR 88 | Temp 98.0°F | Ht 63.0 in | Wt 177.0 lb

## 2022-09-17 DIAGNOSIS — I739 Peripheral vascular disease, unspecified: Secondary | ICD-10-CM | POA: Diagnosis not present

## 2022-09-17 DIAGNOSIS — R350 Frequency of micturition: Secondary | ICD-10-CM | POA: Diagnosis not present

## 2022-09-17 DIAGNOSIS — R300 Dysuria: Secondary | ICD-10-CM | POA: Diagnosis not present

## 2022-09-17 LAB — URINALYSIS, ROUTINE W REFLEX MICROSCOPIC
Bilirubin, UA: NEGATIVE
Glucose, UA: NEGATIVE
Ketones, UA: NEGATIVE
Nitrite, UA: NEGATIVE
Protein,UA: NEGATIVE
RBC, UA: NEGATIVE
Specific Gravity, UA: 1.015 (ref 1.005–1.030)
Urobilinogen, Ur: 0.2 mg/dL (ref 0.2–1.0)
pH, UA: 7.5 (ref 5.0–7.5)

## 2022-09-17 LAB — MICROSCOPIC EXAMINATION
RBC, Urine: NONE SEEN /hpf (ref 0–2)
Renal Epithel, UA: NONE SEEN /hpf

## 2022-09-17 NOTE — Progress Notes (Signed)
Acute Office Visit  Subjective:  Patient ID: Gabriela Smith, female    DOB: [birth year], 82 y.o.   MRN: [id number]  Chief Complaint  Patient presents with   Dysuria   Increased frequency of Urination  Patient is in today for concern of UTI. She states that she urinated every hour yesterday, but then was fine through the nigh and this morning. Denies burning, itching, pain with urination. States that she does not get UTIs frequently. Denies fever or flank pain.   Swelling in her legs She is concerned about the swelling in her legs that it has not improved since her heart surgery two years ago. She states she has compression stockings but they are hot to wear in the summer. States that she elevates her legs but on a stool, she does not elevated them to heart height.  She follows with Cardiology next week.   Review of Systems  Constitutional:  Negative for fever.  Genitourinary:  Positive for frequency and urgency. Negative for dysuria, flank pain and hematuria.   Objective:  BP 130/78   Pulse 88   Temp 98 F (36.7 C)   Ht 5\' 3"  (1.6 m)   Wt 177 lb (80.3 kg)   SpO2 94%   BMI 31.35 kg/m   Physical Exam Constitutional:      General: She is awake. She is not in acute distress.    Appearance: Normal appearance. She is well-developed, well-groomed and overweight. She is not ill-appearing, toxic-appearing or diaphoretic.  Cardiovascular:     Rate and Rhythm: Normal rate. Rhythm irregular.     Pulses: Normal pulses.          Radial pulses are 2+ on the right side and 2+ on the left side.       Posterior tibial pulses are 2+ on the right side and 2+ on the left side.     Heart sounds: Normal heart sounds. No murmur heard.    No gallop.     Comments: RLE greater than LLE edema. Non-pitting Pulmonary:     Effort: Pulmonary effort is normal. No respiratory distress.     Breath sounds: Normal breath sounds. No stridor. No wheezing, rhonchi or rales.  Musculoskeletal:     Cervical  back: Full passive range of motion without pain and neck supple.     Right lower leg: Edema present.     Left lower leg: Edema present.  Skin:    General: Skin is warm.     Capillary Refill: Capillary refill takes less than 2 seconds.     Findings: Abrasion, ecchymosis, erythema and petechiae present.     Comments: Bilateral lower extremities with erythema, petechiae, ecchymosis.   Neurological:     General: No focal deficit present.     Mental Status: She is alert, oriented to person, place, and time and easily aroused. Mental status is at baseline.     GCS: GCS eye subscore is 4. GCS verbal subscore is 5. GCS motor subscore is 6.     Motor: No weakness.  Psychiatric:        Attention and Perception: Attention and perception normal.        Mood and Affect: Mood and affect normal.        Speech: Speech normal.        Behavior: Behavior normal. Behavior is cooperative.        Thought Content: Thought content normal. Thought content does not include homicidal or suicidal ideation. Thought content does  not include homicidal or suicidal plan.        Cognition and Memory: Cognition and memory normal.        Judgment: Judgment normal.       09/17/2022   11:57 AM 09/02/2022    9:34 AM 08/25/2022   11:32 AM  Depression screen PHQ 2/9  Decreased Interest 0 0 0  Down, Depressed, Hopeless 0 0 0  PHQ - 2 Score 0 0 0  Altered sleeping 0 0 0  Tired, decreased energy 0 0 0  Change in appetite 0 0 0  Feeling bad or failure about yourself  0 0 0  Trouble concentrating 0 0 0  Moving slowly or fidgety/restless 0 0 0  Suicidal thoughts 0 0 0  PHQ-9 Score 0 0 0  Difficult doing work/chores Not difficult at all Not difficult at all Not difficult at all      09/17/2022   11:57 AM 09/02/2022    9:34 AM 08/25/2022   11:31 AM 01/03/2022    8:22 AM  GAD 7 : Generalized Anxiety Score  Nervous, Anxious, on Edge 0 2 2 0  Control/stop worrying 0 2 2 0  Worry too much - different things 0 1 1 0   Trouble relaxing 0 0 0 0  Restless 0 0 0 0  Easily annoyed or irritable 0 0 0 0  Afraid - awful might happen 0 0 0 0  Total GAD 7 Score 0 5 5 0  Anxiety Difficulty Not difficult at all Not difficult at all  Not difficult at all   Assessment & Plan:  1. Increased frequency of urination Labs as below. Will communicate results to patient once available.  - Urinalysis, Routine w reflex microscopic - Urine Culture  2. Peripheral vascular insufficiency (HCC) Discussed with patient treatment of vascular insufficiency at home such as compression stockings, walking, and elevation. Patient is not on diuretic. Instructed patient to follow up with PCP tomorrow as previously planned and cardiology next week as previously planned.   The above assessment and management plan was discussed with the patient. The patient verbalized understanding of and has agreed to the management plan using shared-decision making. Patient is aware to call the clinic if they develop any new symptoms or if symptoms fail to improve or worsen. Patient is aware when to return to the clinic for a follow-up visit. Patient educated on when it is appropriate to go to the emergency department.   Return for previously scheduled.  Neale Burly, DNP-FNP [facility] [address] [place], [place] [address] [phone]

## 2022-09-18 LAB — URINE CULTURE

## 2022-09-19 NOTE — Progress Notes (Signed)
Negative for UTI. Please follow up if symptoms continue.

## 2022-09-23 ENCOUNTER — Ambulatory Visit: Payer: Medicare Other | Admitting: Family Medicine

## 2022-09-24 ENCOUNTER — Ambulatory Visit: Payer: Medicare Other

## 2022-09-24 ENCOUNTER — Ambulatory Visit: Payer: Medicare Other | Admitting: Physician Assistant

## 2022-11-12 ENCOUNTER — Other Ambulatory Visit: Payer: Self-pay | Admitting: *Deleted

## 2022-11-12 DIAGNOSIS — I480 Paroxysmal atrial fibrillation: Secondary | ICD-10-CM

## 2022-11-12 MED ORDER — METOPROLOL TARTRATE 100 MG PO TABS
100.0000 mg | ORAL_TABLET | Freq: Two times a day (BID) | ORAL | 0 refills | Status: DC
Start: 2022-11-12 — End: 2023-02-17

## 2022-11-18 DIAGNOSIS — M2012 Hallux valgus (acquired), left foot: Secondary | ICD-10-CM | POA: Diagnosis not present

## 2022-11-18 DIAGNOSIS — M79673 Pain in unspecified foot: Secondary | ICD-10-CM | POA: Diagnosis not present

## 2022-11-18 DIAGNOSIS — M2011 Hallux valgus (acquired), right foot: Secondary | ICD-10-CM | POA: Diagnosis not present

## 2022-12-12 ENCOUNTER — Encounter (HOSPITAL_COMMUNITY): Payer: Self-pay | Admitting: *Deleted

## 2022-12-12 ENCOUNTER — Emergency Department (HOSPITAL_COMMUNITY): Payer: Medicare Other

## 2022-12-12 ENCOUNTER — Other Ambulatory Visit: Payer: Self-pay

## 2022-12-12 ENCOUNTER — Inpatient Hospital Stay (HOSPITAL_COMMUNITY)
Admission: EM | Admit: 2022-12-12 | Discharge: 2022-12-14 | DRG: 291 | Disposition: A | Discharge status: Home Health | Payer: Medicare Other | Attending: Internal Medicine | Admitting: Internal Medicine

## 2022-12-12 DIAGNOSIS — I350 Nonrheumatic aortic (valve) stenosis: Secondary | ICD-10-CM | POA: Diagnosis not present

## 2022-12-12 DIAGNOSIS — I482 Chronic atrial fibrillation, unspecified: Secondary | ICD-10-CM | POA: Diagnosis not present

## 2022-12-12 DIAGNOSIS — I5031 Acute diastolic (congestive) heart failure: Secondary | ICD-10-CM | POA: Insufficient documentation

## 2022-12-12 DIAGNOSIS — E782 Mixed hyperlipidemia: Secondary | ICD-10-CM | POA: Diagnosis not present

## 2022-12-12 DIAGNOSIS — Z79899 Other long term (current) drug therapy: Secondary | ICD-10-CM | POA: Diagnosis not present

## 2022-12-12 DIAGNOSIS — Z885 Allergy status to narcotic agent status: Secondary | ICD-10-CM | POA: Diagnosis not present

## 2022-12-12 DIAGNOSIS — J9601 Acute respiratory failure with hypoxia: Secondary | ICD-10-CM | POA: Diagnosis present

## 2022-12-12 DIAGNOSIS — I491 Atrial premature depolarization: Secondary | ICD-10-CM | POA: Diagnosis not present

## 2022-12-12 DIAGNOSIS — Z79890 Hormone replacement therapy (postmenopausal): Secondary | ICD-10-CM | POA: Diagnosis not present

## 2022-12-12 DIAGNOSIS — E039 Hypothyroidism, unspecified: Secondary | ICD-10-CM | POA: Diagnosis not present

## 2022-12-12 DIAGNOSIS — Z882 Allergy status to sulfonamides status: Secondary | ICD-10-CM | POA: Diagnosis not present

## 2022-12-12 DIAGNOSIS — Z96641 Presence of right artificial hip joint: Secondary | ICD-10-CM | POA: Diagnosis not present

## 2022-12-12 DIAGNOSIS — I10 Essential (primary) hypertension: Secondary | ICD-10-CM | POA: Insufficient documentation

## 2022-12-12 DIAGNOSIS — Z881 Allergy status to other antibiotic agents status: Secondary | ICD-10-CM | POA: Diagnosis not present

## 2022-12-12 DIAGNOSIS — Z888 Allergy status to other drugs, medicaments and biological substances status: Secondary | ICD-10-CM

## 2022-12-12 DIAGNOSIS — R918 Other nonspecific abnormal finding of lung field: Secondary | ICD-10-CM | POA: Diagnosis not present

## 2022-12-12 DIAGNOSIS — I110 Hypertensive heart disease with heart failure: Secondary | ICD-10-CM | POA: Diagnosis not present

## 2022-12-12 DIAGNOSIS — Z87891 Personal history of nicotine dependence: Secondary | ICD-10-CM

## 2022-12-12 DIAGNOSIS — E876 Hypokalemia: Secondary | ICD-10-CM | POA: Diagnosis not present

## 2022-12-12 DIAGNOSIS — Z743 Need for continuous supervision: Secondary | ICD-10-CM | POA: Diagnosis not present

## 2022-12-12 DIAGNOSIS — Z8551 Personal history of malignant neoplasm of bladder: Secondary | ICD-10-CM

## 2022-12-12 DIAGNOSIS — Z951 Presence of aortocoronary bypass graft: Secondary | ICD-10-CM

## 2022-12-12 DIAGNOSIS — Z96652 Presence of left artificial knee joint: Secondary | ICD-10-CM | POA: Diagnosis present

## 2022-12-12 DIAGNOSIS — Z9861 Coronary angioplasty status: Secondary | ICD-10-CM

## 2022-12-12 DIAGNOSIS — I2510 Atherosclerotic heart disease of native coronary artery without angina pectoris: Secondary | ICD-10-CM | POA: Diagnosis not present

## 2022-12-12 DIAGNOSIS — Z91013 Allergy to seafood: Secondary | ICD-10-CM

## 2022-12-12 DIAGNOSIS — Z7901 Long term (current) use of anticoagulants: Secondary | ICD-10-CM | POA: Diagnosis not present

## 2022-12-12 DIAGNOSIS — I509 Heart failure, unspecified: Secondary | ICD-10-CM | POA: Diagnosis not present

## 2022-12-12 DIAGNOSIS — I517 Cardiomegaly: Secondary | ICD-10-CM | POA: Diagnosis not present

## 2022-12-12 DIAGNOSIS — R0602 Shortness of breath: Secondary | ICD-10-CM | POA: Diagnosis not present

## 2022-12-12 DIAGNOSIS — R6889 Other general symptoms and signs: Secondary | ICD-10-CM | POA: Diagnosis not present

## 2022-12-12 DIAGNOSIS — I499 Cardiac arrhythmia, unspecified: Secondary | ICD-10-CM | POA: Diagnosis not present

## 2022-12-12 DIAGNOSIS — I4891 Unspecified atrial fibrillation: Secondary | ICD-10-CM | POA: Diagnosis not present

## 2022-12-12 DIAGNOSIS — Z8249 Family history of ischemic heart disease and other diseases of the circulatory system: Secondary | ICD-10-CM | POA: Diagnosis not present

## 2022-12-12 DIAGNOSIS — Z8262 Family history of osteoporosis: Secondary | ICD-10-CM

## 2022-12-12 DIAGNOSIS — Z7982 Long term (current) use of aspirin: Secondary | ICD-10-CM | POA: Diagnosis not present

## 2022-12-12 DIAGNOSIS — Z91040 Latex allergy status: Secondary | ICD-10-CM | POA: Diagnosis not present

## 2022-12-12 DIAGNOSIS — Z83438 Family history of other disorder of lipoprotein metabolism and other lipidemia: Secondary | ICD-10-CM

## 2022-12-12 HISTORY — DX: Hypokalemia: E87.6

## 2022-12-12 HISTORY — DX: Acute diastolic (congestive) heart failure: I50.31

## 2022-12-12 HISTORY — DX: Essential (primary) hypertension: I10

## 2022-12-12 HISTORY — DX: Acute respiratory failure with hypoxia: J96.01

## 2022-12-12 LAB — TROPONIN I (HIGH SENSITIVITY)
Troponin I (High Sensitivity): 5 ng/L (ref <18)
Troponin I (High Sensitivity): 4 ng/L (ref <18)

## 2022-12-12 LAB — BASIC METABOLIC PANEL
Anion gap: 9 (ref 5–15)
BUN: 13 mg/dL (ref 8–23)
CO2: 24 mmol/L (ref 22–32)
Calcium: 8.6 mg/dL — ABNORMAL LOW (ref 8.9–10.3)
Chloride: 103 mmol/L (ref 98–111)
Creatinine, Ser: 0.78 mg/dL (ref 0.44–1.00)
GFR, Estimated: >60 mL/min (ref >60)
Glucose, Bld: 127 mg/dL — ABNORMAL HIGH (ref 70–99)
Potassium: 3.4 mmol/L — ABNORMAL LOW (ref 3.5–5.1)
Sodium: 136 mmol/L (ref 135–145)

## 2022-12-12 LAB — MAGNESIUM: Magnesium: 2.3 mg/dL (ref 1.7–2.4)

## 2022-12-12 LAB — CBC WITH DIFFERENTIAL/PLATELET
Abs Immature Granulocytes: 0.03 10*3/uL (ref 0.00–0.07)
Basophils Absolute: 0.1 10*3/uL (ref 0.0–0.1)
Basophils Relative: 1 %
Eosinophils Absolute: 0.1 10*3/uL (ref 0.0–0.5)
Eosinophils Relative: 2 %
HCT: 39 % (ref 36.0–46.0)
Hemoglobin: 12.5 g/dL (ref 12.0–15.0)
Immature Granulocytes: 0 %
Lymphocytes Relative: 21 %
Lymphs Abs: 1.6 10*3/uL (ref 0.7–4.0)
MCH: 32.4 pg (ref 26.0–34.0)
MCHC: 32.1 g/dL (ref 30.0–36.0)
MCV: 101 fL — ABNORMAL HIGH (ref 80.0–100.0)
Monocytes Absolute: 0.6 10*3/uL (ref 0.1–1.0)
Monocytes Relative: 8 %
Neutro Abs: 5.2 10*3/uL (ref 1.7–7.7)
Neutrophils Relative %: 68 %
Platelets: 245 10*3/uL (ref 150–400)
RBC: 3.86 MIL/uL — ABNORMAL LOW (ref 3.87–5.11)
RDW: 14 % (ref 11.5–15.5)
WBC: 7.7 10*3/uL (ref 4.0–10.5)
nRBC: 0 % (ref 0.0–0.2)

## 2022-12-12 LAB — BRAIN NATRIURETIC PEPTIDE: B Natriuretic Peptide: 492 pg/mL — ABNORMAL HIGH (ref 0.0–100.0)

## 2022-12-12 LAB — URINALYSIS, ROUTINE W REFLEX MICROSCOPIC
Bacteria, UA: NONE SEEN
Bilirubin Urine: NEGATIVE
Glucose, UA: NEGATIVE mg/dL
Hgb urine dipstick: NEGATIVE
Ketones, ur: NEGATIVE mg/dL
Nitrite: NEGATIVE
Protein, ur: 30 mg/dL — AB
Specific Gravity, Urine: 1.01 (ref 1.005–1.030)
pH: 7 (ref 5.0–8.0)

## 2022-12-12 LAB — DIGOXIN LEVEL: Digoxin Level: 0.5 ng/mL — ABNORMAL LOW (ref 0.8–2.0)

## 2022-12-12 MED ORDER — SODIUM CHLORIDE 0.9 % IV SOLN: 250.0000 mL | INTRAVENOUS | Status: DC | PRN

## 2022-12-12 MED ORDER — APIXABAN 5 MG PO TABS
5.0000 mg | ORAL_TABLET | Freq: Two times a day (BID) | ORAL | Status: DC
  Administered 2022-12-12 – 2022-12-14 (×4): 5 mg via ORAL
  Filled 2022-12-12 (×4): qty 1

## 2022-12-12 MED ORDER — ACETAMINOPHEN 325 MG PO TABS: 650.0000 mg | ORAL_TABLET | ORAL | Status: DC | PRN

## 2022-12-12 MED ORDER — LEVOTHYROXINE SODIUM 50 MCG PO TABS
50.0000 ug | ORAL_TABLET | Freq: Every day | ORAL | Status: DC
  Administered 2022-12-13 – 2022-12-14 (×2): 50 ug via ORAL
  Filled 2022-12-12 (×2): qty 1

## 2022-12-12 MED ORDER — SODIUM CHLORIDE 0.9% FLUSH: 3.0000 mL | INTRAVENOUS | Status: DC | PRN

## 2022-12-12 MED ORDER — DIGOXIN 125 MCG PO TABS
125.0000 ug | ORAL_TABLET | Freq: Every day | ORAL | Status: DC
  Administered 2022-12-13 – 2022-12-14 (×2): 125 ug via ORAL
  Filled 2022-12-12 (×2): qty 1

## 2022-12-12 MED ORDER — FUROSEMIDE 10 MG/ML IJ SOLN
20.0000 mg | Freq: Once | INTRAMUSCULAR | Status: AC
  Administered 2022-12-12: 20 mg via INTRAVENOUS
  Filled 2022-12-12: qty 2

## 2022-12-12 MED ORDER — POTASSIUM CHLORIDE CRYS ER 20 MEQ PO TBCR
40.0000 meq | EXTENDED_RELEASE_TABLET | Freq: Once | ORAL | Status: AC
  Administered 2022-12-12: 40 meq via ORAL
  Filled 2022-12-12: qty 2

## 2022-12-12 MED ORDER — METOPROLOL TARTRATE 50 MG PO TABS
100.0000 mg | ORAL_TABLET | Freq: Two times a day (BID) | ORAL | Status: DC
  Administered 2022-12-12 – 2022-12-14 (×4): 100 mg via ORAL
  Filled 2022-12-12 (×4): qty 2

## 2022-12-12 MED ORDER — SODIUM CHLORIDE 0.9% FLUSH
3.0000 mL | Freq: Two times a day (BID) | INTRAVENOUS | Status: DC
  Administered 2022-12-12 – 2022-12-14 (×5): 3 mL via INTRAVENOUS

## 2022-12-12 MED ORDER — ASPIRIN 81 MG PO TBEC
81.0000 mg | DELAYED_RELEASE_TABLET | Freq: Every day | ORAL | Status: DC
  Administered 2022-12-13 – 2022-12-14 (×2): 81 mg via ORAL
  Filled 2022-12-12 (×2): qty 1

## 2022-12-12 MED ORDER — VITAMIN D 25 MCG (1000 UNIT) PO TABS
1000.0000 [IU] | ORAL_TABLET | Freq: Every day | ORAL | Status: DC
  Administered 2022-12-13 – 2022-12-14 (×2): 1000 [IU] via ORAL
  Filled 2022-12-12 (×2): qty 1

## 2022-12-12 MED ORDER — ALBUTEROL SULFATE HFA 108 (90 BASE) MCG/ACT IN AERS: 2.0000 | INHALATION_SPRAY | RESPIRATORY_TRACT | Status: DC | PRN

## 2022-12-12 MED ORDER — ONDANSETRON HCL 4 MG/2ML IJ SOLN: 4.0000 mg | Freq: Four times a day (QID) | INTRAMUSCULAR | Status: DC | PRN

## 2022-12-12 MED ORDER — ORAL CARE MOUTH RINSE: 15.0000 mL | OROMUCOSAL | Status: DC | PRN

## 2022-12-12 MED ORDER — FUROSEMIDE 10 MG/ML IJ SOLN
20.0000 mg | Freq: Two times a day (BID) | INTRAMUSCULAR | Status: DC
  Administered 2022-12-12 – 2022-12-14 (×4): 20 mg via INTRAVENOUS
  Filled 2022-12-12 (×4): qty 2

## 2022-12-12 MED ORDER — VITAMIN B-12 100 MCG PO TABS
500.0000 ug | ORAL_TABLET | Freq: Every day | ORAL | Status: DC
  Administered 2022-12-13 – 2022-12-14 (×2): 500 ug via ORAL
  Filled 2022-12-12 (×2): qty 5

## 2022-12-12 MED ORDER — CO-ENZYME Q10 200 MG PO CAPS: 200.0000 mg | ORAL_CAPSULE | Freq: Every day | ORAL | Status: DC

## 2022-12-12 NOTE — TOC Initial Note (Addendum)
Transition of Care [facility]) - Initial/Assessment Note    Patient Details  Name: Gabriela Smith MRN: [id number] Date of Birth: Mar 20, 1940  Transition of Care [facility]) CM/SW Contact:    Leitha Bleak, RN Phone Number: 12/12/2022, 1:57 PM  Clinical Narrative:    Patient admitted with Acute respiratory failure. Patient lives with her daughter. She is independent with ADL's, drives occasionally. Does not have any DME needs at this time. Daughter is interested in home health RN for CHF education.  TOC sending out referral for accepting agency. TOC following.               Expected Discharge Plan: Home w Home Health Services Barriers to Discharge: Continued Medical Work up   Patient Goals and CMS Choice Patient states their goals for this hospitalization and ongoing recovery are:: to go home CMS Medicare.gov Compare Post Acute Care list provided to:: Patient Represenative (must comment) Choice offered to / list presented to : Adult Children     Expected Discharge Plan and Services     Post Acute Care Choice: Home Health Living arrangements for the past 2 months: Single Family Home                     Prior Living Arrangements/Services Living arrangements for the past 2 months: Single Family Home Lives with:: Adult Children Patient language and need for interpreter reviewed:: Yes        Need for Family Participation in Patient Care: Yes (Comment) Care giver support system in place?: Yes (comment)   Criminal Activity/Legal Involvement Pertinent to Current Situation/Hospitalization: No - Comment as needed  Activities of Daily Living   ADL Screening (condition at time of admission) Independently performs ADLs?: Yes (appropriate for developmental age) Is the patient deaf or have difficulty hearing?: No Does the patient have difficulty seeing, even when wearing glasses/contacts?: No Does the patient have difficulty concentrating, remembering, or making decisions?: No  Permission  Sought/Granted       Emotional Assessment     Affect (typically observed): Accepting Orientation: : Oriented to Self, Oriented to Situation, Oriented to Place Alcohol / Substance Use: Not Applicable Psych Involvement: No (comment)  Admission diagnosis:  Acute respiratory failure with hypoxia (HCC) [J96.01] Acute congestive heart failure, unspecified heart failure type [facility]) [I50.9] Patient Active Problem List   Diagnosis Date Noted   Acute respiratory failure with hypoxia (HCC) 12/12/2022   Acute heart failure with preserved ejection fraction (HFpEF) (HCC) 12/12/2022   Essential hypertension 12/12/2022   Hypokalemia 12/12/2022   Persistent atrial fibrillation (HCC) 07/03/2020   Edema 07/03/2020   Paroxysmal A-fib (HCC) 08/12/2019   S/P CABG x 4 04/26/2019   Acute CHF (congestive heart failure) (HCC) 04/15/2019   Disease related peripheral neuropathy 03/07/2019   BMI 33.0-33.9,adult 02/17/2019   Intermittent claudication (HCC) 02/17/2019   Peripheral vascular insufficiency (HCC) 02/17/2019   Aortic atherosclerosis (HCC) 02/11/2017   Atrophic vaginitis 09/18/2016   S/P right THA, AA 12/25/2015   S/P hip replacement 12/25/2015   AAA (abdominal aortic aneurysm) without rupture (HCC) 08/24/2015   Statin intolerance 05/23/2013   Osteopenia with high risk of fracture 04/20/2013   Degenerative disc disease, lumbar 02/08/2013   Vitamin D deficiency 02/08/2013   Overweight (BMI 25.0-29.9) 11/24/2012   S/P left TKA 11/22/2012   Malignant neoplasm of urinary bladder (HCC) 11/04/2012   Osteoarthritis of left knee 11/04/2012   Hyperlipidemia LDL goal <70 11/28/2008   Hypertension 11/28/2008   CORONARY ATHEROSCLEROSIS NATIVE CORONARY ARTERY 11/28/2008  PCP:  Raliegh Ip, DO Pharmacy:   [facility] [place], [place] - [address] [place] [address] Phone: [phone] Fax: [phone]    Social Determinants of Health (SDOH) Social  History: SDOH Screenings   Food Insecurity: No Food Insecurity (12/12/2022)  Housing: Low Risk  (12/12/2022)  Transportation Needs: No Transportation Needs (12/12/2022)  Utilities: Not At Risk (12/12/2022)  Alcohol Screen: Low Risk  (02/04/2022)  Depression (PHQ2-9): Low Risk  (09/17/2022)  Financial Resource Strain: Low Risk  (02/04/2022)  Physical Activity: Insufficiently Active (02/04/2022)  Social Connections: Moderately Integrated (02/04/2022)  Stress: No Stress Concern Present (02/04/2022)  Tobacco Use: Medium Risk (12/12/2022)   SDOH Interventions:   Readmission Risk Interventions    12/12/2022    1:57 PM  Readmission Risk Prevention Plan  Post Dischage Appt Not Complete  Medication Screening Complete  Transportation Screening Complete

## 2022-12-12 NOTE — Hospital Course (Signed)
82 year old female with a history of coronary disease status post CABG, AAA, hypertension, chronic atrial fibrillation, hyperlipidemia presenting with 1 to 2-week history of shortness of breath and dyspnea on exertion that significantly worsened in the evening of 12/11/2022.  She has been complaining of gradual increase in lower extremity edema for the past month.  She has had orthopnea type symptoms.  She denies any fever, chills, headache, neck pain, chest pain, cough, hemoptysis, nausea, vomiting or direct abdominal pain. The patient was noted to have oxygen saturation of 89% room air.  She was placed on 2 L with saturation up to the mid 90s. In the ED, the patient was afebrile hemodynamically stable with oxygen saturation 98% on 2 L.  WBC 7.7, hemoglobin 12.5, platelets 245,000.  Sodium 136, potassium 3.4, bicarbonate 24, serum creatinine 0.78.  UA negative for pyuria. Chest x-ray showed bilateral increased interstitial markings.  EKG showed atrial fibrillation with nonspecific T wave changes.  Troponin 5.  BNP 492.  The patient was given furosemide 20 mg IV in the ED.  She was admitted for further evaluation and treatment for congestive heart failure.

## 2022-12-12 NOTE — H&P (Signed)
History and Physical    Patient: Gabriela Smith AOZ:[id number] DOB: Mar 09, 1941 DOA: 12/12/2022 DOS: the patient was seen and examined on 12/12/2022 PCP: Raliegh Ip, DO  Patient coming from: Home  Chief Complaint:  Chief Complaint  Patient presents with   Shortness of Breath   HPI: Gabriela Smith is a 82 year old female with a history of coronary disease status post CABG, AAA, hypertension, chronic atrial fibrillation, hyperlipidemia presenting with 1 to 2-week history of shortness of breath and dyspnea on exertion that significantly worsened in the evening of 12/11/2022.  She has been complaining of gradual increase in lower extremity edema for the past month.  She has had orthopnea type symptoms.  She denies any fever, chills, headache, neck pain, chest pain, cough, hemoptysis, nausea, vomiting or direct abdominal pain. The patient was noted to have oxygen saturation of 89% room air.  She was placed on 2 L with saturation up to the mid 90s. In the ED, the patient was afebrile hemodynamically stable with oxygen saturation 98% on 2 L.  WBC 7.7, hemoglobin 12.5, platelets 245,000.  Sodium 136, potassium 3.4, bicarbonate 24, serum creatinine 0.78.  UA negative for pyuria. Chest x-ray showed bilateral increased interstitial markings.  EKG showed atrial fibrillation with nonspecific T wave changes.  Troponin 5.  BNP 492.  The patient was given furosemide 20 mg IV in the ED.  She was admitted for further evaluation and treatment for congestive heart failure.  Review of Systems: As mentioned in the history of present illness. All other systems reviewed and are negative. Past Medical History:  Diagnosis Date   AAA (abdominal aortic aneurysm) [facility])    cardiology aware    Arthritis    OA lt knee- cortizone inj. q 4 months AND OA ALSO IN BACK. Had knee replaced-issue resolved   CAD (coronary artery disease) CARDIOLOGIST- DR [facility]--  VISIT 01-02-10 IN EPIC   1993-- PTCA. Pt describes a near  total blockage of apparently the LAD and a 65% stenosis elsewhere.   Cancer [facility])    History of bladder cancer followed by dr Vernie Ammons   hx  TCC of bladder ,  Ta G3-  first occurence 02-20-2012--  s/p BCG tx's  and  mitomycin C   Hyperlipidemia 1993   Hypertension 1993   Nocturia    Osteopenia    Sigmoid diverticulosis    Past Surgical History:  Procedure Laterality Date   CHOLECYSTECTOMY  2003 (approx)   CLIPPING OF ATRIAL APPENDAGE N/A 04/25/2019   Procedure: Clipping Of Atrial Appendage;  Surgeon: Corliss Skains, MD;  Location: [facility] OR;  Service: Open Heart Surgery;  Laterality: N/A;   COLONOSCOPY  05-31-2003   CORONARY ANGIOPLASTY  1993   to LAD   CORONARY ARTERY BYPASS GRAFT N/A 04/25/2019   Procedure: CORONARY ARTERY BYPASS GRAFTING (CABG) times four using left internal mammary artery and left greater saphenous vein harvested endoscipically.;  Surgeon: Corliss Skains, MD;  Location: [facility] OR;  Service: Open Heart Surgery;  Laterality: N/A;   CYSTOSCOPY WITH BIOPSY  05/09/2011   Procedure: CYSTOSCOPY WITH BIOPSY;  Surgeon: Garnett Farm, MD;  Location: [facility];  Service: Urology;  Laterality: N/A;  WITH BLADDER BIOPSY GYRUS   LEFT HEART CATH AND CORONARY ANGIOGRAPHY N/A 04/19/2019   Procedure: LEFT HEART CATH AND CORONARY ANGIOGRAPHY;  Surgeon: Marykay Lex, MD;  Location: [facility] INVASIVE CV LAB;  Service: Cardiovascular;  Laterality: N/A;   TOTAL HIP ARTHROPLASTY Right 12/25/2015   Procedure: RIGHT  TOTAL HIP ARTHROPLASTY ANTERIOR APPROACH;  Surgeon: Durene Romans, MD;  Location: [facility] ORS;  Service: Orthopedics;  Laterality: Right;   TOTAL KNEE ARTHROPLASTY Left 11/22/2012   Procedure: LEFT TOTAL KNEE ARTHROPLASTY;  Surgeon: Shelda Pal, MD;  Location: [facility] ORS;  Service: Orthopedics;  Laterality: Left;   TRANSURETHRAL RESECTION OF BLADDER TUMOR  03/14/2011   Procedure: TRANSURETHRAL RESECTION OF BLADDER TUMOR (TURBT);  Surgeon: Garnett Farm, MD;  Location: [facility];  Service: Urology;  Laterality: N/A;   TRANSURETHRAL RESECTION OF BLADDER TUMOR  05/09/2011   Procedure: TRANSURETHRAL RESECTION OF BLADDER TUMOR (TURBT);  Surgeon: Garnett Farm, MD;  Location: [facility];  Service: Urology;  Laterality: N/A;   TRANSURETHRAL RESECTION OF BLADDER TUMOR  03/08/2012   Procedure: TRANSURETHRAL RESECTION OF BLADDER TUMOR (TURBT);  Surgeon: Garnett Farm, MD;  Location: [facility];  Service: Urology;  Laterality: N/A;   TRANSURETHRAL RESECTION OF BLADDER TUMOR WITH GYRUS (TURBT-GYRUS) N/A 03/24/2014   Procedure: TRANSURETHRAL RESECTION OF BLADDER TUMOR WITH GYRUS (TURBT-GYRUS);  Surgeon: Garnett Farm, MD;  Location: [facility];  Service: Urology;  Laterality: N/A;   UMBILICAL HERNIA REPAIR  2001  (approx)   Social History:  reports that she quit smoking about 46 years ago. Her smoking use included cigarettes. She started smoking about 51 years ago. She has a 2.5 pack-year smoking history. She has never used smokeless tobacco. She reports current alcohol use of about 4.0 standard drinks of alcohol per week. She reports that she does not use drugs.  Allergies  Allergen Reactions   Fish Allergy Hives, Shortness Of Breath and Other (See Comments)    "Bottom-feeder fish"   Fish-Derived Products Hives, Shortness Of Breath and Other (See Comments)    Bottom-feeder fish   Shellfish-Derived Products Hives and Shortness Of Breath   Lasix [Furosemide] Swelling and Other (See Comments)    Edema, elevated BP   Codeine Rash   Latex Itching and Rash    Reports Elastic in underwear, under wire bras, and now surgical cap cause rash and itching.    Macrodantin [Nitrofurantoin Macrocrystal] Other (See Comments)    Unknown reaction    Niacin Other (See Comments)    Muscle aches   Niaspan [Niacin Er] Other (See Comments)    Muscle aches   Statins Other (See Comments)    Muscle aches/ cramps   Sulfa  Antibiotics Rash and Other (See Comments)    Muscle aches/ cramps, also   Vicodin [Hydrocodone-Acetaminophen] Nausea And Vomiting and Other (See Comments)    SEVERE N/V   Zetia [Ezetimibe] Other (See Comments)    Muscle aches    Family History  Problem Relation Age of Onset   Coronary artery disease Father 59   Hyperlipidemia Father    Heart attack Mother 57   Hyperlipidemia Mother    Osteoporosis Sister    Hyperlipidemia Sister    Heart attack Daughter 60       CABG x 5   Heart disease Son 32       Stents x 3    Prior to Admission medications   Medication Sig Start Date End Date Taking? Authorizing Provider  acetaminophen (TYLENOL) 500 MG tablet Take 2 tablets (1,000 mg total) by mouth every 8 (eight) hours. Patient taking differently: Take 1,000 mg by mouth every 6 (six) hours as needed for moderate pain. 12/26/15  Yes Babish, Molli Hazard, PA-C  amLODipine (NORVASC) 5 MG tablet Take 1 tablet (5  mg total) by mouth daily. 08/25/22  Yes Gottschalk, Kathie Rhodes M, DO  apixaban (ELIQUIS) 5 MG TABS tablet Take 1 tablet (5 mg total) by mouth 2 (two) times daily. 05/29/22  Yes Rollene Rotunda, MD  aspirin EC 81 MG EC tablet Take 1 tablet (81 mg total) by mouth daily. 05/02/19  Yes Barrett, Erin R, PA-C  cholecalciferol (VITAMIN D) 1000 UNITS tablet Take 1,000 Units by mouth daily after breakfast.    Yes [provider]  Co-Enzyme Q10 200 MG CAPS Take 200 mg by mouth daily after breakfast.    Yes [provider]  digoxin (LANOXIN) 0.125 MG tablet TAKE ONE TABLET BY MOUTH DAILY 06/09/22  Yes Hochrein, Fayrene Fearing, MD  Evolocumab (REPATHA SURECLICK) 140 MG/ML SOAJ INJECT 140mg  (1ml) EVERY TWO WEEKS AS DIRECTED 06/23/22  Yes Hilty, Lisette Abu, MD  Flaxseed, Linseed, (FLAX SEED OIL) 1000 MG CAPS Take 1,000 mg by mouth daily after breakfast.    Yes [provider]  GNP VITAMIN B-12 500 MCG tablet TAKE 1 TABLET DAILY 07/30/20  Yes Rollene Rotunda, MD  hydrocortisone 1 % lotion Apply 1  application. topically 2 (two) times daily. Patient taking differently: Apply 1 application  topically 2 (two) times daily as needed for itching (Dermatitis). 07/19/21  Yes Daryll Drown, NP  levothyroxine (SYNTHROID) 50 MCG tablet Take 1 tablet (50 mcg total) by mouth daily. 08/25/22  Yes Delynn Flavin M, DO  Magnesium 300 MG CAPS Take 300 mg by mouth at bedtime.    Yes [provider]  metoprolol tartrate (LOPRESSOR) 100 MG tablet Take 1 tablet (100 mg total) by mouth 2 (two) times daily. **NEEDS TO BE SEEN BEFORE NEXT REFILL** 11/12/22  Yes Gottschalk, Ashly M, DO  nitroGLYCERIN (NITROSTAT) 0.4 MG SL tablet PLACE 1 TABLET UNDER THE TONGUE AT ONSET OF CHEST PAIN EVERY 5 MINTUES UP TO 3 TIMES AS NEEDED 05/30/22  Yes Rollene Rotunda, MD  traMADol (ULTRAM) 50 MG tablet Take 50 mg by mouth every 6 (six) hours as needed for moderate pain.   Yes [provider]  TURMERIC PO Take 1 capsule by mouth daily after breakfast.   Yes [provider]    Physical Exam: Vitals:   12/12/22 0924 12/12/22 1100 12/12/22 1200 12/12/22 1300  BP:  (!) 140/78 135/78 (!) 150/95  Pulse:  83 91 71  Resp:  20 19 17   Temp:   98 F (36.7 C) 98.5 F (36.9 C)  TempSrc:      SpO2: 96% 98% 98% 98%   GENERAL:  A&O x 3, NAD, well developed, cooperative, follows commands HEENT: [place]/AT, No thrush, No icterus, No oral ulcers Neck:  No neck mass, No meningismus, soft, supple CV: RRR, no S3, no S4, no rub, + JVD Lungs:  bilateral crackles Abd: soft/NT +BS, nondistended Ext: 2 + LE edema, no lymphangitis, no cyanosis, no rashes Neuro:  CN II-XII intact, strength 4/5 in RUE, RLE, strength 4/5 LUE, LLE; sensation intact bilateral; no dysmetria; babinski equivocal  Data Reviewed: Data reviewed above in history  Assessment and Plan: Acute respiratory failure with hypoxia -Secondary to pulmonary edema -Stable on 2 liters nasal cannula -Wean oxygen as tolerated for saturation greater 92%  Acute  HFpEF -04/19/2019 echo EF 60 to 65%, no WMA, normal RVF -Update echo -Continue IV furosemide -Accurate I's and O's -Daily weights -ReDS vest reading  Coronary artery disease -No chest pain presently -s/p CABG X 4 with LIMA to LAD, SVG to Mid LAD, SVG to OM, and  SVG to PDA  04/2019  Chronic atrial fibrillation -Rate controlled -Continue apixaban -Continue metoprolol -Continue digoxin  Mixed hyperlipidemia -She is on Repatha  Essential hypertension -Continue amlodipine and metoprolol  Hypothyroidism -Continue Synthroid   Advance Care Planning: FULL  Consults: none  Family Communication: daughter 10/4  Severity of Illness: The appropriate patient status for this patient is INPATIENT. Inpatient status is judged to be reasonable and necessary in order to provide the required intensity of service to ensure the patient's safety. The patient's presenting symptoms, physical exam findings, and initial radiographic and laboratory data in the context of their chronic comorbidities is felt to place them at high risk for further clinical deterioration. Furthermore, it is not anticipated that the patient will be medically stable for discharge from the hospital within 2 midnights of admission.   * I certify that at the point of admission it is my clinical judgment that the patient will require inpatient hospital care spanning beyond 2 midnights from the point of admission due to high intensity of service, high risk for further deterioration and high frequency of surveillance required.*  Author: Catarina Hartshorn, MD 12/12/2022 1:16 PM  For on call review [email or web address].

## 2022-12-12 NOTE — ED Triage Notes (Signed)
Pt BIB [facility] for c/o sob that started this am; pt also has some swelling to bilateral feet

## 2022-12-12 NOTE — Plan of Care (Signed)

## 2022-12-12 NOTE — ED Provider Notes (Signed)
[place] EMERGENCY DEPARTMENT AT [facility] Provider Note   CSN: [id number] Arrival date & time: 12/12/22  2956     History  Chief Complaint  Patient presents with   Shortness of Breath    Gabriela Smith is a 82 y.o. female.   Shortness of Breath Associated symptoms: no abdominal pain, no chest pain, no cough, no fever, no headaches, no vomiting and no wheezing        Gabriela Smith is a 82 y.o. female with past medical history of coronary artery disease, abdominal aortic aneurysm, CHF, hypertension, atrial fibrillation, anticoagulated on Eliquis, who presents to the Emergency Department complaining of shortness of breath that began in the early morning hours.  She states that she got up to use the restroom several times during the night and noticed gradually increasing shortness of breath, dyspnea on exertion.  She endorses swelling to her bilateral lower extremities that she states is at her baseline secondary to her CABG procedure.  Does not take any diuretics, but does take digoxin daily.  Denies any cough, fever, chills, chest pain, or any missed doses of her medications.  Does not use supplemental oxygen at baseline.  Home Medications Prior to Admission medications   Medication Sig Start Date End Date Taking? Authorizing Provider  acetaminophen (TYLENOL) 500 MG tablet Take 2 tablets (1,000 mg total) by mouth every 8 (eight) hours. 12/26/15   Lanney Gins, PA-C  amLODipine (NORVASC) 5 MG tablet Take 1 tablet (5 mg total) by mouth daily. 08/25/22   Raliegh Ip, DO  apixaban (ELIQUIS) 5 MG TABS tablet Take 1 tablet (5 mg total) by mouth 2 (two) times daily. 05/29/22   Rollene Rotunda, MD  aspirin EC 81 MG EC tablet Take 1 tablet (81 mg total) by mouth daily. 05/02/19   Barrett, Erin R, PA-C  cholecalciferol (VITAMIN D) 1000 UNITS tablet Take 1,000 Units by mouth daily after breakfast.     [provider]  Co-Enzyme Q10 200 MG CAPS Take 200 mg by  mouth daily after breakfast.     [provider]  digoxin (LANOXIN) 0.125 MG tablet TAKE ONE TABLET BY MOUTH DAILY 06/09/22   Rollene Rotunda, MD  Evolocumab (REPATHA SURECLICK) 140 MG/ML SOAJ INJECT 140mg  (1ml) EVERY TWO WEEKS AS DIRECTED 06/23/22   Hilty, Lisette Abu, MD  Flaxseed, Linseed, (FLAX SEED OIL) 1000 MG CAPS Take 1,000 mg by mouth daily after breakfast.     [provider]  GNP VITAMIN B-12 500 MCG tablet TAKE 1 TABLET DAILY 07/30/20   Rollene Rotunda, MD  hydrocortisone 1 % lotion Apply 1 application. topically 2 (two) times daily. 07/19/21   Daryll Drown, NP  levothyroxine (SYNTHROID) 50 MCG tablet Take 1 tablet (50 mcg total) by mouth daily. 08/25/22   Raliegh Ip, DO  Magnesium 300 MG CAPS Take 300 mg by mouth at bedtime.     [provider]  metoprolol tartrate (LOPRESSOR) 100 MG tablet Take 1 tablet (100 mg total) by mouth 2 (two) times daily. **NEEDS TO BE SEEN BEFORE NEXT REFILL** 11/12/22   Delynn Flavin M, DO  nitroGLYCERIN (NITROSTAT) 0.4 MG SL tablet PLACE 1 TABLET UNDER THE TONGUE AT ONSET OF CHEST PAIN EVERY 5 MINTUES UP TO 3 TIMES AS NEEDED 05/30/22   Rollene Rotunda, MD  risedronate (ACTONEL) 150 MG tablet Take 1 tablet (150 mg total) by mouth every 30 (thirty) days. with water on empty stomach, nothing by mouth or lie down for next  30 minutes. 09/02/22   Raliegh Ip, DO  traMADol (ULTRAM) 50 MG tablet Take by mouth every 6 (six) hours as needed.    [provider]  TURMERIC PO Take 1 capsule by mouth daily after breakfast.    [provider]      Allergies    Fish allergy, Fish-derived products, Shellfish-derived products, Lasix [furosemide], Codeine, Latex, Macrodantin [nitrofurantoin macrocrystal], Niacin, Niaspan [niacin er], Statins, Sulfa antibiotics, Vicodin [hydrocodone-acetaminophen], and Zetia [ezetimibe]    Review of Systems   Review of Systems  Constitutional:  Negative for chills and fever.  HENT:   Negative for congestion.   Respiratory:  Positive for shortness of breath. Negative for cough, chest tightness and wheezing.   Cardiovascular:  Positive for leg swelling. Negative for chest pain.  Gastrointestinal:  Negative for abdominal pain, nausea and vomiting.  Musculoskeletal:  Negative for arthralgias and back pain.  Neurological:  Negative for dizziness, weakness, numbness and headaches.    Physical Exam Updated Vital Signs BP 136/88 (BP Location: Left Arm)   Pulse 75   Temp 97.8 F (36.6 C) (Oral)   Resp (!) 25   SpO2 96%  Physical Exam Vitals and nursing note reviewed.  Constitutional:      General: She is not in acute distress.    Appearance: She is well-developed. She is not toxic-appearing.  HENT:     Mouth/Throat:     Mouth: Mucous membranes are moist.  Eyes:     Conjunctiva/sclera: Conjunctivae normal.     Pupils: Pupils are equal, round, and reactive to light.  Cardiovascular:     Rate and Rhythm: Normal rate. Rhythm irregular.     Pulses: Normal pulses.  Pulmonary:     Breath sounds: No wheezing or rales.     Comments: Few scattered rhonchi, patient on 2 L of O2 by nasal cannula, breathing is labored. Abdominal:     Palpations: Abdomen is soft.     Tenderness: There is no abdominal tenderness. There is no guarding.  Musculoskeletal:     Right lower leg: Edema present.     Left lower leg: Edema present.  Skin:    General: Skin is warm.     Capillary Refill: Capillary refill takes less than 2 seconds.     Comments: Several small scattered excoriations to bilateral lower extremities, right greater than left.  No excessive warmth or lymphangitis.  Neurological:     General: No focal deficit present.     Mental Status: She is alert and oriented to person, place, and time.     Sensory: No sensory deficit.     Motor: No weakness.     ED Results / Procedures / Treatments   Labs (all labs ordered are listed, but only abnormal results are displayed) Labs  Reviewed  BASIC METABOLIC PANEL - Abnormal; Notable for the following components:      Result Value   Potassium 3.4 (*)    Glucose, Bld 127 (*)    Calcium 8.6 (*)    All other components within normal limits  BRAIN NATRIURETIC PEPTIDE - Abnormal; Notable for the following components:   B Natriuretic Peptide 492.0 (*)    All other components within normal limits  URINALYSIS, ROUTINE W REFLEX MICROSCOPIC - Abnormal; Notable for the following components:   APPearance HAZY (*)    Protein, ur 30 (*)    Leukocytes,Ua TRACE (*)    All other components within normal limits  CBC WITH DIFFERENTIAL/PLATELET - Abnormal; Notable for the  following components:   RBC 3.86 (*)    MCV 101.0 (*)    All other components within normal limits  DIGOXIN LEVEL - Abnormal; Notable for the following components:   Digoxin Level 0.5 (*)    All other components within normal limits  MAGNESIUM  TROPONIN I (HIGH SENSITIVITY)  TROPONIN I (HIGH SENSITIVITY)    EKG EKG Interpretation Date/Time:  Friday December 12 2022 09:23:51 EDT Ventricular Rate:  91 PR Interval:    QRS Duration:  84 QT Interval:  383 QTC Calculation: 472 R Axis:   77  Text Interpretation: Atrial fibrillation Nonspecific repol abnormality, lateral leads No acute changes No significant change since last tracing Confirmed by Derwood Kaplan [phone]) on 12/12/2022 11:03:13 AM  Radiology DG Chest Portable 1 View  Result Date: 12/12/2022 CLINICAL DATA:  Shortness of breath EXAM: PORTABLE CHEST 1 VIEW COMPARISON:  Chest radiograph dated 06/17/2019 FINDINGS: Normal lung volumes. Mild bilateral interstitial opacities. No pleural effusion or pneumothorax. Increased enlargement of the cardiomediastinal silhouette. Atrial appendage clip in-situ. Median sternotomy wires are nondisplaced. IMPRESSION: 1. Mild bilateral interstitial opacities, which may represent pulmonary edema. 2. Increased cardiomegaly. Electronically Signed   By: Agustin Cree M.D.   On:  12/12/2022 10:46    Procedures Procedures    Medications Ordered in ED Medications  albuterol (VENTOLIN HFA) 108 (90 Base) MCG/ACT inhaler 2 puff (has no administration in time range)  furosemide (LASIX) injection 20 mg (20 mg Intravenous Given 12/12/22 1120)    ED Course/ Medical Decision Making/ A&P                                 Medical Decision Making Patient comes from home with complaint of gradually worsening shortness of breath this morning.  Hypoxic on arrival was placed on O2 with improvement , she had dyspnea on exertion.  Endorses getting up several times to go to the restroom this morning having worsening shortness of breath each time.  Denies any chest pain cough fever or chills.  No recent medication changes.  History of CHF but does not take diuretic.   Does take digoxin and Eliquis.  Patient had echo intraoperative TEE 2021 shows EF of 60 to 65%  I suspect patient's dyspnea is secondary to CHF exacerbation.  Pneumonia, ACS, PE also considered.  Will check labs.  Patient has documented history of intolerance to Lasix, but on discussion is willing to try Lasix here.  No hypotension.  Prior hypoxia improved on 2 L nasal cannula  Amount and/or Complexity of Data Reviewed Labs: ordered.    Details: Labs show no evidence of leukocytosis, BMP elevated at 492.  Digoxin level 0.5.  Chemistries without significant derangement.  Troponin reassuring Radiology: ordered.    Details: Chest x-ray shows likely pulmonary edema ECG/medicine tests: ordered.    Details: EKG shows atrial fibrillation with nonspecific repull abnormality.  No significant change from last tracing Discussion of management or test interpretation with external provider(s): Patient appears to be diuretic nave, will start with 20 Lasix IV here.  Patient has questionable intolerance but after discussion, she is willing to try slow administration of Lasix here.  On recheck, patient appears to be diuresing well.  No  further hypoxia, is currently on 2 L nasal cannula.  Patient also seen by Dr. Rhunette Croft.  Patient will need hospital admission for further diuresis and management  Consulted [facility] hospitalist, Dr. Arbutus Leas who agrees to admit  Risk  Prescription drug management.           Final Clinical Impression(s) / ED Diagnoses Final diagnoses:  Acute congestive heart failure, unspecified heart failure type [facility])    Rx / DC Orders ED Discharge Orders     None         Pauline Aus, PA-C 12/12/22 1826    Derwood Kaplan, MD 12/13/22 [phone]

## 2022-12-12 NOTE — Consult Note (Signed)
PHARMACIST - PHYSICIAN ORDER COMMUNICATION  CONCERNING: P&T Medication Policy on Herbal Medications  DESCRIPTION:  This patient's order for:  CoQ-10 200 mg  has been noted.  This product(s) is classified as an "herbal" or natural product. Due to a lack of definitive safety studies or FDA approval, nonstandard manufacturing practices, plus the potential risk of unknown drug-drug interactions while on inpatient medications, the Pharmacy and Therapeutics Committee does not permit the use of "herbal" or natural products of this type within [employer].   ACTION TAKEN: The pharmacy department is unable to verify this order at this time and your patient has been informed of this safety policy. Please reevaluate patient's clinical condition at discharge and address if the herbal or natural product(s) should be resumed at that time.   

## 2022-12-13 ENCOUNTER — Inpatient Hospital Stay (HOSPITAL_COMMUNITY): Payer: Medicare Other

## 2022-12-13 DIAGNOSIS — J9601 Acute respiratory failure with hypoxia: Secondary | ICD-10-CM

## 2022-12-13 DIAGNOSIS — I10 Essential (primary) hypertension: Secondary | ICD-10-CM | POA: Diagnosis not present

## 2022-12-13 DIAGNOSIS — I5031 Acute diastolic (congestive) heart failure: Secondary | ICD-10-CM | POA: Diagnosis not present

## 2022-12-13 LAB — ECHOCARDIOGRAM COMPLETE
AR max vel: 1.01 cm2
AV Area VTI: 0.89 cm2
AV Area mean vel: 0.9 cm2
AV Mean grad: 15.8 mm[Hg]
AV Peak grad: 23.9 mm[Hg]
Ao pk vel: 2.44 m/s
Area-P 1/2: 3.6 cm2
S' Lateral: 3 cm
Weight: 2828.94 [oz_av]

## 2022-12-13 LAB — BASIC METABOLIC PANEL
Anion gap: 8 (ref 5–15)
BUN: 13 mg/dL (ref 8–23)
CO2: 27 mmol/L (ref 22–32)
Calcium: 8.9 mg/dL (ref 8.9–10.3)
Chloride: 104 mmol/L (ref 98–111)
Creatinine, Ser: 0.75 mg/dL (ref 0.44–1.00)
GFR, Estimated: >60 mL/min (ref >60)
Glucose, Bld: 114 mg/dL — ABNORMAL HIGH (ref 70–99)
Potassium: 3.5 mmol/L (ref 3.5–5.1)
Sodium: 139 mmol/L (ref 135–145)

## 2022-12-13 MED ORDER — POTASSIUM CHLORIDE CRYS ER 20 MEQ PO TBCR
40.0000 meq | EXTENDED_RELEASE_TABLET | Freq: Once | ORAL | Status: AC
  Administered 2022-12-13: 40 meq via ORAL
  Filled 2022-12-13: qty 2

## 2022-12-13 NOTE — Progress Notes (Signed)
Pt has small sores on BLE and BLE as well as her chest. She stated "they are painful, but do not itch." MD notified. No new orders at this time. Vitals stable. New PIV placed in L hand.

## 2022-12-13 NOTE — Progress Notes (Signed)
Pt has had no acute events this shift. No c/o pain or discomfort at this time. Will continue to monitor.

## 2022-12-13 NOTE — Progress Notes (Signed)
Reviewed: I have personally reviewed following labs and imaging studies Basic Metabolic Panel: Recent Labs  Lab 12/12/22 1025 12/13/22 0605  NA 136 139  K 3.4* 3.5  CL 103 104  CO2 24 27  GLUCOSE 127* 114*  BUN 13 13  CREATININE 0.78 0.75  CALCIUM 8.6* 8.9  MG 2.3  --    Liver Function Tests: No results for input(s): "AST", "ALT", "ALKPHOS", "BILITOT", "PROT", "ALBUMIN" in the last 168 hours. No results for input(s): "LIPASE", "AMYLASE" in the last 168 hours. No results  for input(s): "AMMONIA" in the last 168 hours. Coagulation Profile: No results for input(s): "INR", "PROTIME" in the last 168 hours. CBC: Recent Labs  Lab 12/12/22 1025  WBC 7.7  NEUTROABS 5.2  HGB 12.5  HCT 39.0  MCV 101.0*  PLT 245   Cardiac Enzymes: No results for input(s): "CKTOTAL", "CKMB", "CKMBINDEX", "TROPONINI" in the last 168 hours. BNP: Invalid input(s): "POCBNP" CBG: No results for input(s): "GLUCAP" in the last 168 hours. HbA1C: No results for input(s): "HGBA1C" in the last 72 hours. Urine analysis:    Component Value Date/Time   COLORURINE YELLOW 12/12/2022 0939   APPEARANCEUR HAZY (A) 12/12/2022 0939   APPEARANCEUR Clear 09/17/2022 1211   LABSPEC 1.010 12/12/2022 0939   PHURINE 7.0 12/12/2022 0939   GLUCOSEU NEGATIVE 12/12/2022 0939   HGBUR NEGATIVE 12/12/2022 0939   BILIRUBINUR NEGATIVE 12/12/2022 0939   BILIRUBINUR Negative 09/17/2022 1211   KETONESUR NEGATIVE 12/12/2022 0939   PROTEINUR 30 (A) 12/12/2022 0939   UROBILINOGEN 0.2 11/15/2012 1005   NITRITE NEGATIVE 12/12/2022 0939   LEUKOCYTESUR TRACE (A) 12/12/2022 0939   Sepsis Labs: @LABRCNTIP (procalcitonin:4,lacticidven:4) )No results found for this or any previous visit (from the past 240 hour(s)).   Scheduled Meds:  apixaban  5 mg Oral BID   aspirin EC  81 mg Oral Daily   cholecalciferol  1,000 Units Oral QPC breakfast   digoxin  125 mcg Oral Daily   furosemide  20 mg Intravenous BID   levothyroxine  50 mcg Oral Daily   metoprolol tartrate  100 mg Oral BID   sodium chloride flush  3 mL Intravenous Q12H   cyanocobalamin  500 mcg Oral Daily   Continuous Infusions:  sodium chloride      Procedures/Studies: ECHOCARDIOGRAM COMPLETE  Result Date: 12/13/2022    ECHOCARDIOGRAM REPORT   Patient Name:   ANNSLEE TERCERO Date of Exam: 12/13/2022 Medical Rec #:  [id number]      Height:       63.0 in Accession #:    [id number]     Weight:       176.8 lb Date of Birth:  Feb 17, 1941      BSA:           1.835 m Patient Age:    39 years       BP:           123/82 mmHg Patient Gender: F              HR:           71 bpm. Exam Location:  Inpatient Procedure: 2D Echo, Cardiac Doppler, Color Doppler and 3D Echo Indications:    CHF- Acute Diastolic I50.31  History:        Patient has prior history of Echocardiogram examinations, most                 recent 04/25/2019. AAA, CAD; Risk Factors:Dyslipidemia and  PROGRESS NOTE  ASHBY LEFLORE EXB:[id number] DOB: 09-12-1940 DOA: 12/12/2022 PCP: Raliegh Ip, DO  Brief History:  82 year old female with a history of coronary disease status post CABG, AAA, hypertension, chronic atrial fibrillation, hyperlipidemia presenting with 1 to 2-week history of shortness of breath and dyspnea on exertion that significantly worsened in the evening of 12/11/2022.  She has been complaining of gradual increase in lower extremity edema for the past month.  She has had orthopnea type symptoms.  She denies any fever, chills, headache, neck pain, chest pain, cough, hemoptysis, nausea, vomiting or direct abdominal pain. The patient was noted to have oxygen saturation of 89% room air.  She was placed on 2 L with saturation up to the mid 90s. In the ED, the patient was afebrile hemodynamically stable with oxygen saturation 98% on 2 L.  WBC 7.7, hemoglobin 12.5, platelets 245,000.  Sodium 136, potassium 3.4, bicarbonate 24, serum creatinine 0.78.  UA negative for pyuria. Chest x-ray showed bilateral increased interstitial markings.  EKG showed atrial fibrillation with nonspecific T wave changes.  Troponin 5.  BNP 492.  The patient was given furosemide 20 mg IV in the ED.  She was admitted for further evaluation and treatment for congestive heart failure.   Assessment/Plan: Acute respiratory failure with hypoxia -Secondary to pulmonary edema -Stable on 2 liters nasal cannula -Wean oxygen as tolerated for saturation greater 92% -now weaned to RA   Acute HFpEF -04/19/2019 echo EF 60 to 65%, no WMA, normal RVF -10/5  echo EF 60-65%, no WMA, normal RVF; mild-mod AS -Continue IV furosemide -Accurate I's and O's -Daily weights--down to 175.4 -ReDS vest reading--38   Coronary artery disease -No chest pain presently -s/p CABG X 4 with LIMA to LAD, SVG to Mid LAD, SVG to OM, and SVG to PDA  04/2019   Chronic atrial fibrillation -Rate controlled -Continue  apixaban -Continue metoprolol -Continue digoxin   Mixed hyperlipidemia -She is on Repatha   Essential hypertension -Continue metoprolol -holding amlodipine currently   Hypothyroidism -Continue Synthroid  Aortic Stenosis -will need to follow up with Dr. Antoine Poche         Family Communication:  daughter updated 10/5  Consultants:  none  Code Status:  FULL  DVT Prophylaxis: apixaban   Procedures: As Listed in Progress Note Above  Antibiotics: None       Subjective: Pt is breathing better.  Denies f/c, cp, sob, n/v/d  Objective: Vitals:   12/13/22 0153 12/13/22 0514 12/13/22 1300 12/13/22 1618  BP: (!) 142/86 123/82 126/80   Pulse: 93 61 60   Resp: 18     Temp: 98.2 F (36.8 C) (!) 97.5 F (36.4 C) 97.9 F (36.6 C)   TempSrc: Oral Axillary    SpO2: 94% 95% 94%   Weight:  80.2 kg  79.6 kg  Height:    5\' 3"  (1.6 m)    Intake/Output Summary (Last 24 hours) at 12/13/2022 1632 Last data filed at 12/13/2022 0900 Gross per 24 hour  Intake 480 ml  Output 2000 ml  Net -1520 ml   Weight change:  Exam:  General:  Pt is alert, follows commands appropriately, not in acute distress HEENT: No icterus, No thrush, No neck mass, [place]/AT Cardiovascular: RRR, S1/S2, no rubs, no gallops Respiratory: fine bibasilar crackles.  No wheeze Abdomen: Soft/+BS, non tender, non distended, no guarding Extremities: 1 + LE edema, No lymphangitis, No petechiae, No rashes, no synovitis   Data  Reviewed: I have personally reviewed following labs and imaging studies Basic Metabolic Panel: Recent Labs  Lab 12/12/22 1025 12/13/22 0605  NA 136 139  K 3.4* 3.5  CL 103 104  CO2 24 27  GLUCOSE 127* 114*  BUN 13 13  CREATININE 0.78 0.75  CALCIUM 8.6* 8.9  MG 2.3  --    Liver Function Tests: No results for input(s): "AST", "ALT", "ALKPHOS", "BILITOT", "PROT", "ALBUMIN" in the last 168 hours. No results for input(s): "LIPASE", "AMYLASE" in the last 168 hours. No results  for input(s): "AMMONIA" in the last 168 hours. Coagulation Profile: No results for input(s): "INR", "PROTIME" in the last 168 hours. CBC: Recent Labs  Lab 12/12/22 1025  WBC 7.7  NEUTROABS 5.2  HGB 12.5  HCT 39.0  MCV 101.0*  PLT 245   Cardiac Enzymes: No results for input(s): "CKTOTAL", "CKMB", "CKMBINDEX", "TROPONINI" in the last 168 hours. BNP: Invalid input(s): "POCBNP" CBG: No results for input(s): "GLUCAP" in the last 168 hours. HbA1C: No results for input(s): "HGBA1C" in the last 72 hours. Urine analysis:    Component Value Date/Time   COLORURINE YELLOW 12/12/2022 0939   APPEARANCEUR HAZY (A) 12/12/2022 0939   APPEARANCEUR Clear 09/17/2022 1211   LABSPEC 1.010 12/12/2022 0939   PHURINE 7.0 12/12/2022 0939   GLUCOSEU NEGATIVE 12/12/2022 0939   HGBUR NEGATIVE 12/12/2022 0939   BILIRUBINUR NEGATIVE 12/12/2022 0939   BILIRUBINUR Negative 09/17/2022 1211   KETONESUR NEGATIVE 12/12/2022 0939   PROTEINUR 30 (A) 12/12/2022 0939   UROBILINOGEN 0.2 11/15/2012 1005   NITRITE NEGATIVE 12/12/2022 0939   LEUKOCYTESUR TRACE (A) 12/12/2022 0939   Sepsis Labs: @LABRCNTIP (procalcitonin:4,lacticidven:4) )No results found for this or any previous visit (from the past 240 hour(s)).   Scheduled Meds:  apixaban  5 mg Oral BID   aspirin EC  81 mg Oral Daily   cholecalciferol  1,000 Units Oral QPC breakfast   digoxin  125 mcg Oral Daily   furosemide  20 mg Intravenous BID   levothyroxine  50 mcg Oral Daily   metoprolol tartrate  100 mg Oral BID   sodium chloride flush  3 mL Intravenous Q12H   cyanocobalamin  500 mcg Oral Daily   Continuous Infusions:  sodium chloride      Procedures/Studies: ECHOCARDIOGRAM COMPLETE  Result Date: 12/13/2022    ECHOCARDIOGRAM REPORT   Patient Name:   ANNSLEE TERCERO Date of Exam: 12/13/2022 Medical Rec #:  [id number]      Height:       63.0 in Accession #:    [id number]     Weight:       176.8 lb Date of Birth:  Feb 17, 1941      BSA:           1.835 m Patient Age:    39 years       BP:           123/82 mmHg Patient Gender: F              HR:           71 bpm. Exam Location:  Inpatient Procedure: 2D Echo, Cardiac Doppler, Color Doppler and 3D Echo Indications:    CHF- Acute Diastolic I50.31  History:        Patient has prior history of Echocardiogram examinations, most                 recent 04/25/2019. AAA, CAD; Risk Factors:Dyslipidemia and  PROGRESS NOTE  ASHBY LEFLORE EXB:[id number] DOB: 09-12-1940 DOA: 12/12/2022 PCP: Raliegh Ip, DO  Brief History:  82 year old female with a history of coronary disease status post CABG, AAA, hypertension, chronic atrial fibrillation, hyperlipidemia presenting with 1 to 2-week history of shortness of breath and dyspnea on exertion that significantly worsened in the evening of 12/11/2022.  She has been complaining of gradual increase in lower extremity edema for the past month.  She has had orthopnea type symptoms.  She denies any fever, chills, headache, neck pain, chest pain, cough, hemoptysis, nausea, vomiting or direct abdominal pain. The patient was noted to have oxygen saturation of 89% room air.  She was placed on 2 L with saturation up to the mid 90s. In the ED, the patient was afebrile hemodynamically stable with oxygen saturation 98% on 2 L.  WBC 7.7, hemoglobin 12.5, platelets 245,000.  Sodium 136, potassium 3.4, bicarbonate 24, serum creatinine 0.78.  UA negative for pyuria. Chest x-ray showed bilateral increased interstitial markings.  EKG showed atrial fibrillation with nonspecific T wave changes.  Troponin 5.  BNP 492.  The patient was given furosemide 20 mg IV in the ED.  She was admitted for further evaluation and treatment for congestive heart failure.   Assessment/Plan: Acute respiratory failure with hypoxia -Secondary to pulmonary edema -Stable on 2 liters nasal cannula -Wean oxygen as tolerated for saturation greater 92% -now weaned to RA   Acute HFpEF -04/19/2019 echo EF 60 to 65%, no WMA, normal RVF -10/5  echo EF 60-65%, no WMA, normal RVF; mild-mod AS -Continue IV furosemide -Accurate I's and O's -Daily weights--down to 175.4 -ReDS vest reading--38   Coronary artery disease -No chest pain presently -s/p CABG X 4 with LIMA to LAD, SVG to Mid LAD, SVG to OM, and SVG to PDA  04/2019   Chronic atrial fibrillation -Rate controlled -Continue  apixaban -Continue metoprolol -Continue digoxin   Mixed hyperlipidemia -She is on Repatha   Essential hypertension -Continue metoprolol -holding amlodipine currently   Hypothyroidism -Continue Synthroid  Aortic Stenosis -will need to follow up with Dr. Antoine Poche         Family Communication:  daughter updated 10/5  Consultants:  none  Code Status:  FULL  DVT Prophylaxis: apixaban   Procedures: As Listed in Progress Note Above  Antibiotics: None       Subjective: Pt is breathing better.  Denies f/c, cp, sob, n/v/d  Objective: Vitals:   12/13/22 0153 12/13/22 0514 12/13/22 1300 12/13/22 1618  BP: (!) 142/86 123/82 126/80   Pulse: 93 61 60   Resp: 18     Temp: 98.2 F (36.8 C) (!) 97.5 F (36.4 C) 97.9 F (36.6 C)   TempSrc: Oral Axillary    SpO2: 94% 95% 94%   Weight:  80.2 kg  79.6 kg  Height:    5\' 3"  (1.6 m)    Intake/Output Summary (Last 24 hours) at 12/13/2022 1632 Last data filed at 12/13/2022 0900 Gross per 24 hour  Intake 480 ml  Output 2000 ml  Net -1520 ml   Weight change:  Exam:  General:  Pt is alert, follows commands appropriately, not in acute distress HEENT: No icterus, No thrush, No neck mass, [place]/AT Cardiovascular: RRR, S1/S2, no rubs, no gallops Respiratory: fine bibasilar crackles.  No wheeze Abdomen: Soft/+BS, non tender, non distended, no guarding Extremities: 1 + LE edema, No lymphangitis, No petechiae, No rashes, no synovitis   Data

## 2022-12-13 NOTE — Progress Notes (Signed)
   12/13/22 0800  ReDS Vest / Clip  Station Marker B  Ruler Value 36  ReDS Value Range 36 - 40  ReDS Actual Value 38

## 2022-12-13 NOTE — Plan of Care (Signed)

## 2022-12-14 DIAGNOSIS — E876 Hypokalemia: Secondary | ICD-10-CM

## 2022-12-14 DIAGNOSIS — J9601 Acute respiratory failure with hypoxia: Secondary | ICD-10-CM | POA: Diagnosis not present

## 2022-12-14 DIAGNOSIS — I5031 Acute diastolic (congestive) heart failure: Secondary | ICD-10-CM | POA: Diagnosis not present

## 2022-12-14 DIAGNOSIS — I10 Essential (primary) hypertension: Secondary | ICD-10-CM | POA: Diagnosis not present

## 2022-12-14 LAB — BASIC METABOLIC PANEL
Anion gap: 11 (ref 5–15)
BUN: 19 mg/dL (ref 8–23)
CO2: 25 mmol/L (ref 22–32)
Calcium: 9.1 mg/dL (ref 8.9–10.3)
Chloride: 102 mmol/L (ref 98–111)
Creatinine, Ser: 0.8 mg/dL (ref 0.44–1.00)
GFR, Estimated: >60 mL/min (ref >60)
Glucose, Bld: 118 mg/dL — ABNORMAL HIGH (ref 70–99)
Potassium: 3.7 mmol/L (ref 3.5–5.1)
Sodium: 138 mmol/L (ref 135–145)

## 2022-12-14 LAB — MAGNESIUM: Magnesium: 2.1 mg/dL (ref 1.7–2.4)

## 2022-12-14 MED ORDER — FUROSEMIDE 40 MG PO TABS: 40.0000 mg | ORAL_TABLET | Freq: Every day | ORAL | Status: DC

## 2022-12-14 MED ORDER — FUROSEMIDE 40 MG PO TABS: 40.0000 mg | ORAL_TABLET | Freq: Every day | ORAL | 1 refills | Status: DC

## 2022-12-14 MED ORDER — POTASSIUM CHLORIDE CRYS ER 20 MEQ PO TBCR: 20.0000 meq | EXTENDED_RELEASE_TABLET | Freq: Every day | ORAL | 1 refills | Status: AC

## 2022-12-14 MED ORDER — POTASSIUM CHLORIDE CRYS ER 20 MEQ PO TBCR
20.0000 meq | EXTENDED_RELEASE_TABLET | Freq: Every day | ORAL | Status: DC
  Administered 2022-12-14: 20 meq via ORAL
  Filled 2022-12-14: qty 1

## 2022-12-14 NOTE — Plan of Care (Signed)

## 2022-12-14 NOTE — Progress Notes (Signed)
   12/14/22 0900  ReDS Vest / Clip  Station Marker B  Ruler Value 36  ReDS Value Range < 36  ReDS Actual Value 23

## 2022-12-14 NOTE — Discharge Summary (Signed)
Physician Discharge Summary   Patient: Gabriela Smith MRN: [id number] DOB: [birth year]  Admit date:     12/12/2022  Discharge date: 12/14/22  Discharge Physician: Onalee Hua Ciana Simmon   PCP: Raliegh Ip, DO   Recommendations at discharge:   Please follow up with primary care provider within 1-2 weeks  Please repeat BMP and CBC in one week      Hospital Course: 82 year old female with a history of coronary disease status post CABG, AAA, hypertension, chronic atrial fibrillation, hyperlipidemia presenting with 1 to 2-week history of shortness of breath and dyspnea on exertion that significantly worsened in the evening of 12/11/2022.  She has been complaining of gradual increase in lower extremity edema for the past month.  She has had orthopnea type symptoms.  She denies any fever, chills, headache, neck pain, chest pain, cough, hemoptysis, nausea, vomiting or direct abdominal pain. The patient was noted to have oxygen saturation of 89% room air.  She was placed on 2 L with saturation up to the mid 90s. In the ED, the patient was afebrile hemodynamically stable with oxygen saturation 98% on 2 L.  WBC 7.7, hemoglobin 12.5, platelets 245,000.  Sodium 136, potassium 3.4, bicarbonate 24, serum creatinine 0.78.  UA negative for pyuria. Chest x-ray showed bilateral increased interstitial markings.  EKG showed atrial fibrillation with nonspecific T wave changes.  Troponin 5.  BNP 492.  The patient was given furosemide 20 mg IV in the ED.  She was admitted for further evaluation and treatment for congestive heart failure.  Assessment and Plan: Acute respiratory failure with hypoxia -Secondary to pulmonary edema -Stable on 2 liters nasal cannula initially -Wean oxygen as tolerated for saturation greater 92% -now weaned to RA   Acute HFpEF -04/19/2019 echo EF 60 to 65%, no WMA, normal RVF -12/13/22  echo EF 60-65%, no WMA, normal RVF; mild-mod AS -Continued IV furosemide during hospitalization -Accurate  I's and O's--incomplete -Daily weights--down to 175.1 -ReDS vest reading--38>>23 -d/c home with lasix 40 mg po daily with KCl 20 mEQ daily -BMP in one week after d/c   Coronary artery disease -No chest pain presently -s/p CABG X 4 with LIMA to LAD, SVG to Mid LAD, SVG to OM, and SVG to PDA  04/2019   Chronic atrial fibrillation -Rate controlled -Continue apixaban -Continue metoprolol -Continue digoxin   Mixed hyperlipidemia -She is on Repatha   Essential hypertension -Continue metoprolol -holding amlodipine temporarily>>restart   Hypothyroidism -Continue Synthroid   Aortic Stenosis -will need to follow up with Dr. Antoine Poche -informed patient and daughter--they will call for appointment on 10/7          Consultants: none Procedures performed: none  Disposition: Home Diet recommendation:  Cardiac diet DISCHARGE MEDICATION: Allergies as of 12/14/2022       Reactions   Fish Allergy Hives, Shortness Of Breath, Other (See Comments)   "Bottom-feeder fish"   Fish-derived Products Hives, Shortness Of Breath, Other (See Comments)   Bottom-feeder fish   Shellfish-derived Products Hives, Shortness Of Breath   Lasix [furosemide] Swelling, Other (See Comments)   Edema, elevated BP   Codeine Rash   Latex Itching, Rash   Reports Elastic in underwear, under wire bras, and now surgical cap cause rash and itching.    Macrodantin [nitrofurantoin Macrocrystal] Other (See Comments)   Unknown reaction    Niacin Other (See Comments)   Muscle aches   Niaspan [niacin Er] Other (See Comments)   Muscle aches   Statins Other (See Comments)   Muscle  Weight:       176.8 lb Date of Birth:  January 21, 1941      BSA:          1.835 m Patient Age:    82 years       BP:           123/82 mmHg Patient Gender: F              HR:           71 bpm. Exam Location:  Inpatient Procedure: 2D Echo, Cardiac Doppler, Color Doppler and 3D Echo Indications:    CHF- Acute Diastolic I50.31  History:        Patient has prior history of Echocardiogram examinations, most                 recent 04/25/2019. AAA, CAD; Risk Factors:Dyslipidemia and                 Hypertension.  Sonographer:    Harriette Bouillon RDCS Referring Phys: [phone] Wymon Swaney IMPRESSIONS  1. Left ventricular ejection fraction, by estimation, is 60 to 65%. The left ventricle has normal function. The left ventricle has no regional wall motion abnormalities. There is mild left ventricular hypertrophy. Left ventricular diastolic parameters are indeterminate.  2. Right ventricular systolic function is normal. The right ventricular size is normal. There is mildly elevated pulmonary artery systolic pressure.  3. Left atrial size was severely dilated.  4. Right atrial size was moderately  dilated.  5. The mitral valve is degenerative. Mild mitral valve regurgitation. No evidence of mitral stenosis. Moderate mitral annular calcification.  6. Tricuspid valve regurgitation is moderate.  7. The aortic valve is normal in structure. There is moderate calcification of the aortic valve. There is moderate thickening of the aortic valve. Aortic valve regurgitation is not visualized. Moderate to severe aortic valve stenosis.  8. The inferior vena cava is normal in size with greater than 50% respiratory variability, suggesting right atrial pressure of 3 mmHg. FINDINGS  Left Ventricle: Left ventricular ejection fraction, by estimation, is 60 to 65%. The left ventricle has normal function. The left ventricle has no regional wall motion abnormalities. The left ventricular internal cavity size was normal in size. There is  mild left ventricular hypertrophy. Left ventricular diastolic parameters are indeterminate. Right Ventricle: The right ventricular size is normal. No increase in right ventricular wall thickness. Right ventricular systolic function is normal. There is mildly elevated pulmonary artery systolic pressure. The tricuspid regurgitant velocity is 2.96  m/s, and with an assumed right atrial pressure of 8 mmHg, the estimated right ventricular systolic pressure is 43.0 mmHg. Left Atrium: Left atrial size was severely dilated. Right Atrium: Right atrial size was moderately dilated. Pericardium: There is no evidence of pericardial effusion. Mitral Valve: The mitral valve is degenerative in appearance. There is moderate thickening of the mitral valve leaflet(s). There is moderate calcification of the mitral valve leaflet(s). Moderate mitral annular calcification. Mild mitral valve regurgitation. No evidence of mitral valve stenosis. Tricuspid Valve: The tricuspid valve is normal in structure. Tricuspid valve regurgitation is moderate . No evidence of tricuspid stenosis. Aortic Valve: The aortic valve is normal  in structure. There is moderate calcification of the aortic valve. There is moderate thickening of the aortic valve. Aortic valve regurgitation is not visualized. Moderate to severe aortic stenosis is present. Aortic valve mean gradient measures 15.8 mmHg. Aortic valve peak gradient measures 23.9 mmHg. Aortic valve area, by VTI measures 0.89 cm. Pulmonic Valve:  Physician Discharge Summary   Patient: Gabriela Smith MRN: [id number] DOB: [birth year]  Admit date:     12/12/2022  Discharge date: 12/14/22  Discharge Physician: Onalee Hua Ciana Simmon   PCP: Raliegh Ip, DO   Recommendations at discharge:   Please follow up with primary care provider within 1-2 weeks  Please repeat BMP and CBC in one week      Hospital Course: 82 year old female with a history of coronary disease status post CABG, AAA, hypertension, chronic atrial fibrillation, hyperlipidemia presenting with 1 to 2-week history of shortness of breath and dyspnea on exertion that significantly worsened in the evening of 12/11/2022.  She has been complaining of gradual increase in lower extremity edema for the past month.  She has had orthopnea type symptoms.  She denies any fever, chills, headache, neck pain, chest pain, cough, hemoptysis, nausea, vomiting or direct abdominal pain. The patient was noted to have oxygen saturation of 89% room air.  She was placed on 2 L with saturation up to the mid 90s. In the ED, the patient was afebrile hemodynamically stable with oxygen saturation 98% on 2 L.  WBC 7.7, hemoglobin 12.5, platelets 245,000.  Sodium 136, potassium 3.4, bicarbonate 24, serum creatinine 0.78.  UA negative for pyuria. Chest x-ray showed bilateral increased interstitial markings.  EKG showed atrial fibrillation with nonspecific T wave changes.  Troponin 5.  BNP 492.  The patient was given furosemide 20 mg IV in the ED.  She was admitted for further evaluation and treatment for congestive heart failure.  Assessment and Plan: Acute respiratory failure with hypoxia -Secondary to pulmonary edema -Stable on 2 liters nasal cannula initially -Wean oxygen as tolerated for saturation greater 92% -now weaned to RA   Acute HFpEF -04/19/2019 echo EF 60 to 65%, no WMA, normal RVF -12/13/22  echo EF 60-65%, no WMA, normal RVF; mild-mod AS -Continued IV furosemide during hospitalization -Accurate  I's and O's--incomplete -Daily weights--down to 175.1 -ReDS vest reading--38>>23 -d/c home with lasix 40 mg po daily with KCl 20 mEQ daily -BMP in one week after d/c   Coronary artery disease -No chest pain presently -s/p CABG X 4 with LIMA to LAD, SVG to Mid LAD, SVG to OM, and SVG to PDA  04/2019   Chronic atrial fibrillation -Rate controlled -Continue apixaban -Continue metoprolol -Continue digoxin   Mixed hyperlipidemia -She is on Repatha   Essential hypertension -Continue metoprolol -holding amlodipine temporarily>>restart   Hypothyroidism -Continue Synthroid   Aortic Stenosis -will need to follow up with Dr. Antoine Poche -informed patient and daughter--they will call for appointment on 10/7          Consultants: none Procedures performed: none  Disposition: Home Diet recommendation:  Cardiac diet DISCHARGE MEDICATION: Allergies as of 12/14/2022       Reactions   Fish Allergy Hives, Shortness Of Breath, Other (See Comments)   "Bottom-feeder fish"   Fish-derived Products Hives, Shortness Of Breath, Other (See Comments)   Bottom-feeder fish   Shellfish-derived Products Hives, Shortness Of Breath   Lasix [furosemide] Swelling, Other (See Comments)   Edema, elevated BP   Codeine Rash   Latex Itching, Rash   Reports Elastic in underwear, under wire bras, and now surgical cap cause rash and itching.    Macrodantin [nitrofurantoin Macrocrystal] Other (See Comments)   Unknown reaction    Niacin Other (See Comments)   Muscle aches   Niaspan [niacin Er] Other (See Comments)   Muscle aches   Statins Other (See Comments)   Muscle  Weight:       176.8 lb Date of Birth:  January 21, 1941      BSA:          1.835 m Patient Age:    82 years       BP:           123/82 mmHg Patient Gender: F              HR:           71 bpm. Exam Location:  Inpatient Procedure: 2D Echo, Cardiac Doppler, Color Doppler and 3D Echo Indications:    CHF- Acute Diastolic I50.31  History:        Patient has prior history of Echocardiogram examinations, most                 recent 04/25/2019. AAA, CAD; Risk Factors:Dyslipidemia and                 Hypertension.  Sonographer:    Harriette Bouillon RDCS Referring Phys: [phone] Wymon Swaney IMPRESSIONS  1. Left ventricular ejection fraction, by estimation, is 60 to 65%. The left ventricle has normal function. The left ventricle has no regional wall motion abnormalities. There is mild left ventricular hypertrophy. Left ventricular diastolic parameters are indeterminate.  2. Right ventricular systolic function is normal. The right ventricular size is normal. There is mildly elevated pulmonary artery systolic pressure.  3. Left atrial size was severely dilated.  4. Right atrial size was moderately  dilated.  5. The mitral valve is degenerative. Mild mitral valve regurgitation. No evidence of mitral stenosis. Moderate mitral annular calcification.  6. Tricuspid valve regurgitation is moderate.  7. The aortic valve is normal in structure. There is moderate calcification of the aortic valve. There is moderate thickening of the aortic valve. Aortic valve regurgitation is not visualized. Moderate to severe aortic valve stenosis.  8. The inferior vena cava is normal in size with greater than 50% respiratory variability, suggesting right atrial pressure of 3 mmHg. FINDINGS  Left Ventricle: Left ventricular ejection fraction, by estimation, is 60 to 65%. The left ventricle has normal function. The left ventricle has no regional wall motion abnormalities. The left ventricular internal cavity size was normal in size. There is  mild left ventricular hypertrophy. Left ventricular diastolic parameters are indeterminate. Right Ventricle: The right ventricular size is normal. No increase in right ventricular wall thickness. Right ventricular systolic function is normal. There is mildly elevated pulmonary artery systolic pressure. The tricuspid regurgitant velocity is 2.96  m/s, and with an assumed right atrial pressure of 8 mmHg, the estimated right ventricular systolic pressure is 43.0 mmHg. Left Atrium: Left atrial size was severely dilated. Right Atrium: Right atrial size was moderately dilated. Pericardium: There is no evidence of pericardial effusion. Mitral Valve: The mitral valve is degenerative in appearance. There is moderate thickening of the mitral valve leaflet(s). There is moderate calcification of the mitral valve leaflet(s). Moderate mitral annular calcification. Mild mitral valve regurgitation. No evidence of mitral valve stenosis. Tricuspid Valve: The tricuspid valve is normal in structure. Tricuspid valve regurgitation is moderate . No evidence of tricuspid stenosis. Aortic Valve: The aortic valve is normal  in structure. There is moderate calcification of the aortic valve. There is moderate thickening of the aortic valve. Aortic valve regurgitation is not visualized. Moderate to severe aortic stenosis is present. Aortic valve mean gradient measures 15.8 mmHg. Aortic valve peak gradient measures 23.9 mmHg. Aortic valve area, by VTI measures 0.89 cm. Pulmonic Valve:  Weight:       176.8 lb Date of Birth:  January 21, 1941      BSA:          1.835 m Patient Age:    82 years       BP:           123/82 mmHg Patient Gender: F              HR:           71 bpm. Exam Location:  Inpatient Procedure: 2D Echo, Cardiac Doppler, Color Doppler and 3D Echo Indications:    CHF- Acute Diastolic I50.31  History:        Patient has prior history of Echocardiogram examinations, most                 recent 04/25/2019. AAA, CAD; Risk Factors:Dyslipidemia and                 Hypertension.  Sonographer:    Harriette Bouillon RDCS Referring Phys: [phone] Wymon Swaney IMPRESSIONS  1. Left ventricular ejection fraction, by estimation, is 60 to 65%. The left ventricle has normal function. The left ventricle has no regional wall motion abnormalities. There is mild left ventricular hypertrophy. Left ventricular diastolic parameters are indeterminate.  2. Right ventricular systolic function is normal. The right ventricular size is normal. There is mildly elevated pulmonary artery systolic pressure.  3. Left atrial size was severely dilated.  4. Right atrial size was moderately  dilated.  5. The mitral valve is degenerative. Mild mitral valve regurgitation. No evidence of mitral stenosis. Moderate mitral annular calcification.  6. Tricuspid valve regurgitation is moderate.  7. The aortic valve is normal in structure. There is moderate calcification of the aortic valve. There is moderate thickening of the aortic valve. Aortic valve regurgitation is not visualized. Moderate to severe aortic valve stenosis.  8. The inferior vena cava is normal in size with greater than 50% respiratory variability, suggesting right atrial pressure of 3 mmHg. FINDINGS  Left Ventricle: Left ventricular ejection fraction, by estimation, is 60 to 65%. The left ventricle has normal function. The left ventricle has no regional wall motion abnormalities. The left ventricular internal cavity size was normal in size. There is  mild left ventricular hypertrophy. Left ventricular diastolic parameters are indeterminate. Right Ventricle: The right ventricular size is normal. No increase in right ventricular wall thickness. Right ventricular systolic function is normal. There is mildly elevated pulmonary artery systolic pressure. The tricuspid regurgitant velocity is 2.96  m/s, and with an assumed right atrial pressure of 8 mmHg, the estimated right ventricular systolic pressure is 43.0 mmHg. Left Atrium: Left atrial size was severely dilated. Right Atrium: Right atrial size was moderately dilated. Pericardium: There is no evidence of pericardial effusion. Mitral Valve: The mitral valve is degenerative in appearance. There is moderate thickening of the mitral valve leaflet(s). There is moderate calcification of the mitral valve leaflet(s). Moderate mitral annular calcification. Mild mitral valve regurgitation. No evidence of mitral valve stenosis. Tricuspid Valve: The tricuspid valve is normal in structure. Tricuspid valve regurgitation is moderate . No evidence of tricuspid stenosis. Aortic Valve: The aortic valve is normal  in structure. There is moderate calcification of the aortic valve. There is moderate thickening of the aortic valve. Aortic valve regurgitation is not visualized. Moderate to severe aortic stenosis is present. Aortic valve mean gradient measures 15.8 mmHg. Aortic valve peak gradient measures 23.9 mmHg. Aortic valve area, by VTI measures 0.89 cm. Pulmonic Valve:

## 2022-12-14 NOTE — Progress Notes (Signed)
Nsg Discharge Note  Admit Date:  12/12/2022 Discharge date: 12/14/2022   Caleb Popp to be D/C'd Home per MD order.  AVS completed.   Patient/caregiver able to verbalize understanding.  Discharge Medication: Allergies as of 12/14/2022       Reactions   Fish Allergy Hives, Shortness Of Breath, Other (See Comments)   "Bottom-feeder fish"   Fish-derived Products Hives, Shortness Of Breath, Other (See Comments)   Bottom-feeder fish   Shellfish-derived Products Hives, Shortness Of Breath   Lasix [furosemide] Swelling, Other (See Comments)   Edema, elevated BP   Codeine Rash   Latex Itching, Rash   Reports Elastic in underwear, under wire bras, and now surgical cap cause rash and itching.    Macrodantin [nitrofurantoin Macrocrystal] Other (See Comments)   Unknown reaction    Niacin Other (See Comments)   Muscle aches   Niaspan [niacin Er] Other (See Comments)   Muscle aches   Statins Other (See Comments)   Muscle aches/ cramps   Sulfa Antibiotics Rash, Other (See Comments)   Muscle aches/ cramps, also   Vicodin [hydrocodone-acetaminophen] Nausea And Vomiting, Other (See Comments)   SEVERE N/V   Zetia [ezetimibe] Other (See Comments)   Muscle aches        Medication List     TAKE these medications    acetaminophen 500 MG tablet Commonly known as: TYLENOL Take 2 tablets (1,000 mg total) by mouth every 8 (eight) hours. What changed:  when to take this reasons to take this   amLODipine 5 MG tablet Commonly known as: NORVASC Take 1 tablet (5 mg total) by mouth daily.   apixaban 5 MG Tabs tablet Commonly known as: ELIQUIS Take 1 tablet (5 mg total) by mouth 2 (two) times daily.   aspirin EC 81 MG tablet Take 1 tablet (81 mg total) by mouth daily.   cholecalciferol 1000 units tablet Commonly known as: VITAMIN D Take 1,000 Units by mouth daily after breakfast.   Co-Enzyme Q10 200 MG Caps Take 200 mg by mouth daily after breakfast.   digoxin 0.125 MG  tablet Commonly known as: LANOXIN TAKE ONE TABLET BY MOUTH DAILY   Flax Seed Oil 1000 MG Caps Take 1,000 mg by mouth daily after breakfast.   furosemide 40 MG tablet Commonly known as: LASIX Take 1 tablet (40 mg total) by mouth daily. Start taking on: December 15, 2022   [facility] Vitamin B-12 500 MCG tablet Generic drug: cyanocobalamin TAKE 1 TABLET DAILY   hydrocortisone 1 % lotion Apply 1 application. topically 2 (two) times daily. What changed:  when to take this reasons to take this   levothyroxine 50 MCG tablet Commonly known as: SYNTHROID Take 1 tablet (50 mcg total) by mouth daily.   Magnesium 300 MG Caps Take 300 mg by mouth at bedtime.   metoprolol tartrate 100 MG tablet Commonly known as: LOPRESSOR Take 1 tablet (100 mg total) by mouth 2 (two) times daily. **NEEDS TO BE SEEN BEFORE NEXT REFILL**   nitroGLYCERIN 0.4 MG SL tablet Commonly known as: NITROSTAT PLACE 1 TABLET UNDER THE TONGUE AT ONSET OF CHEST PAIN EVERY 5 MINTUES UP TO 3 TIMES AS NEEDED   potassium chloride SA 20 MEQ tablet Commonly known as: KLOR-CON M Take 1 tablet (20 mEq total) by mouth daily.   Repatha SureClick 140 MG/ML Soaj Generic drug: Evolocumab INJECT 140mg  (1ml) EVERY TWO WEEKS AS DIRECTED   traMADol 50 MG tablet Commonly known as: ULTRAM Take 50 mg by mouth every 6 (  six) hours as needed for moderate pain.   TURMERIC PO Take 1 capsule by mouth daily after breakfast.        Discharge Assessment: Vitals:   12/13/22 2035 12/14/22 0527  BP: 135/80 134/88  Pulse: 78 89  Resp: 18 20  Temp: 97.9 F (36.6 C) 97.7 F (36.5 C)  SpO2: 95% 94%   Skin clean, dry and intact without evidence of skin break down, no evidence of skin tears noted. IV catheter discontinued intact. Site without signs and symptoms of complications - no redness or edema noted at insertion site, patient denies c/o pain - only slight tenderness at site.  Dressing with slight pressure applied.  D/c  Instructions-Education: Discharge instructions given to patient/family with verbalized understanding. D/c education completed with patient/family including follow up instructions, medication list, d/c activities limitations if indicated, with other d/c instructions as indicated by MD - patient able to verbalize understanding, all questions fully answered. Patient instructed to return to ED, call 911, or call MD for any changes in condition.  Patient escorted via WC, and D/C home via private auto.  Laurena Spies, RN 12/14/2022 11:15 AM

## 2022-12-15 ENCOUNTER — Telehealth: Payer: Self-pay

## 2022-12-15 NOTE — Transitions of Care (Post Inpatient/ED Visit) (Signed)
12/15/2022  Name: Gabriela Smith MRN: [id number] DOB: 1940/10/09  Today's TOC FU Call Status: Today's TOC FU Call Status:: Successful TOC FU Call Completed TOC FU Call Complete Date: 12/15/22 Patient's Name and Date of Birth confirmed.  Transition Care Management Follow-up Telephone Call Date of Discharge: 12/14/22 [facility] Type of Discharge: Inpatient Admission Primary Inpatient Discharge Diagnosis:: Acute Congestive Heart Failure How have you been since you were released from the hospital?: Better (Per patient's daughter, Amy, patient is feeling much better) Any questions or concerns?: No  Items Reviewed: Did you receive and understand the discharge instructions provided?: Yes Medications obtained,verified, and reconciled?: Yes (Medications Reviewed) Any new allergies since your discharge?: No Dietary orders reviewed?: Yes Type of Diet Ordered:: Low sodium, heart healthy Do you have support at home?: Yes People in Home: child(ren), adult Name of Support/Comfort Primary Source: Amy  Medications Reviewed Today: Medications Reviewed Today     Reviewed by Jodelle Gross, RN (Case Manager) on 12/15/22 at 1604  Med List Status: <None>   Medication Order Taking? Sig Documenting Provider Last Dose Status Informant  acetaminophen (TYLENOL) 500 MG tablet [id number] Yes Take 2 tablets (1,000 mg total) by mouth every 8 (eight) hours.  Patient taking differently: Take 1,000 mg by mouth every 6 (six) hours as needed for moderate pain.   Lanney Gins, PA-C Taking Active Self, Child, Pharmacy Records  amLODipine (NORVASC) 5 MG tablet [id number] Yes Take 1 tablet (5 mg total) by mouth daily. Raliegh Ip, DO Taking Active Self, Child, Pharmacy Records  apixaban (ELIQUIS) 5 MG TABS tablet [id number] Yes Take 1 tablet (5 mg total) by mouth 2 (two) times daily. Rollene Rotunda, MD Taking Active Self, Child, Pharmacy Records  aspirin EC 81 MG EC tablet  [id number] Yes Take 1 tablet (81 mg total) by mouth daily. Barrett, Rae Roam, PA-C Taking Active Self, Child, Pharmacy Records  cholecalciferol (VITAMIN D) 1000 UNITS tablet [id number] Yes Take 1,000 Units by mouth daily after breakfast.  [provider] Taking Active Self, Child, Pharmacy Records           Med Note Antony Madura, Arn Medal   Fri Apr 15, 2019  4:41 PM)    Co-Enzyme Q10 200 MG CAPS [id number] Yes Take 200 mg by mouth daily after breakfast.  [provider] Taking Active Self, Child, Pharmacy Records           Med Note (PFOHL, CONCETTA   Wed May 13, 2017  1:38 PM)    digoxin (LANOXIN) 0.125 MG tablet [id number] Yes TAKE ONE TABLET BY MOUTH DAILY Rollene Rotunda, MD Taking Active Self, Child, Pharmacy Records  Evolocumab [facility] SURECLICK) 140 MG/ML Ivory Broad [id number] Yes INJECT 140mg  (1ml) EVERY TWO WEEKS AS DIRECTED Hilty, Lisette Abu, MD Taking Active Self, Child, Pharmacy Records  Flaxseed, Linseed, (FLAX SEED OIL) 1000 MG CAPS [id number] Yes Take 1,000 mg by mouth daily after breakfast.  [provider] Taking Active Self, Child, Pharmacy Records           Med Note (PFOHL, CONCETTA   Wed May 13, 2017  1:38 PM)    furosemide (LASIX) 40 MG tablet [id number] Yes Take 1 tablet (40 mg total) by mouth daily. Catarina Hartshorn, MD Taking Active   GNP VITAMIN B-12 500 MCG tablet [id number] Yes TAKE 1 TABLET DAILY Rollene Rotunda, MD Taking Active Self, Child, Pharmacy Records  hydrocortisone 1 % lotion [id number] Yes Apply 1 application. topically 2 (two) times daily.  Patient taking differently: Apply 1  application  topically 2 (two) times daily as needed for itching (Dermatitis).   Daryll Drown, NP Taking Active Self, Child, Pharmacy Records  levothyroxine (SYNTHROID) 50 MCG tablet [id number] Yes Take 1 tablet (50 mcg total) by mouth daily. Raliegh Ip, DO Taking Active Self, Child, Pharmacy Records  Magnesium 300 MG CAPS [id number] Yes Take 300 mg by mouth at bedtime.  [provider] Taking Active Self, Child, Pharmacy Records  metoprolol tartrate (LOPRESSOR) 100 MG tablet [id number] Yes Take 1 tablet (100 mg total) by mouth 2 (two) times daily. **NEEDS TO BE SEEN BEFORE NEXT REFILL** Raliegh Ip, DO Taking Active Self, Child, Pharmacy Records  nitroGLYCERIN (NITROSTAT) 0.4 MG SL tablet [id number] Yes PLACE 1 TABLET UNDER THE TONGUE AT ONSET OF CHEST PAIN EVERY 5 MINTUES UP TO 3 TIMES AS NEEDED Rollene Rotunda, MD Taking Active Self, Child, Pharmacy Records  potassium chloride SA (KLOR-CON M) 20 MEQ tablet [id number] Yes Take 1 tablet (20 mEq total) by mouth daily. Catarina Hartshorn, MD Taking Active   traMADol Janean Sark) 50 MG tablet [id number] Yes Take 50 mg by mouth every 6 (six) hours as needed for moderate pain. [provider] Taking Active Self, Child, Pharmacy Records  TURMERIC PO [id number] Yes Take 1 capsule by mouth daily after breakfast. [provider] Taking Active Self, Child, Pharmacy Records  Med List Note Dorene Sorrow 03/23/18 1516):              Home Care and Equipment/Supplies: Were Home Health Services Ordered?: No Any new equipment or medical supplies ordered?: No  Functional Questionnaire: Do you need assistance with bathing/showering or dressing?: No Do you need assistance with meal preparation?: Yes Do you need assistance with eating?: No Do you have difficulty maintaining continence: No Do you need assistance with getting out of bed/getting out of a chair/moving?: No Do you have difficulty managing or taking your medications?: Yes  Follow up appointments reviewed: PCP Follow-up appointment confirmed?: No MD Provider Line Number:[phone] Given:  (Patient's daughter prefers to make HFU appointment.  She is aware of need for her Mom to have lab work completed.) Specialist Hospital Follow-up appointment confirmed?: Yes Date of Specialist follow-up appointment?: 12/23/22 Follow-Up Specialty Provider:: Marlowe Kays, Neurology Do you need transportation to your follow-up appointment?: No Do you understand care options if your condition(s) worsen?: Yes-patient verbalized understanding  SDOH Interventions Today    Flowsheet Row Most Recent Value  SDOH Interventions   Food Insecurity Interventions Intervention Not Indicated  Housing Interventions Intervention Not Indicated  Transportation Interventions Intervention Not Indicated  Utilities Interventions Intervention Not Indicated     + TOC Interventions Today    Flowsheet Row Most Recent Value  TOC Interventions   TOC Interventions Discussed/Reviewed TOC Interventions Discussed, TOC Interventions Reviewed      Interventions Today    Flowsheet Row Most Recent Value  Chronic Disease   Chronic disease during today's visit Congestive Heart Failure (CHF)  [Discussed with patients daughter the importance of her Mom weighing herself every day and recording weight.  We also discussed her new order for Potassium.]  General Interventions   General Interventions Discussed/Reviewed General Interventions Discussed        Jodelle Gross RN, BSN, CCM RN Care Manager  Transitions of Care  [facility]  [phone]

## 2022-12-22 ENCOUNTER — Encounter: Payer: Self-pay | Admitting: Emergency Medicine

## 2022-12-22 ENCOUNTER — Ambulatory Visit: Payer: Medicare Other | Attending: General Practice | Admitting: Emergency Medicine

## 2022-12-22 VITALS — BP 122/84 | HR 97 | Ht 63.0 in | Wt 174.8 lb

## 2022-12-22 DIAGNOSIS — I4819 Other persistent atrial fibrillation: Secondary | ICD-10-CM

## 2022-12-22 DIAGNOSIS — I10 Essential (primary) hypertension: Secondary | ICD-10-CM | POA: Diagnosis not present

## 2022-12-22 DIAGNOSIS — I350 Nonrheumatic aortic (valve) stenosis: Secondary | ICD-10-CM

## 2022-12-22 DIAGNOSIS — I5031 Acute diastolic (congestive) heart failure: Secondary | ICD-10-CM

## 2022-12-22 NOTE — Progress Notes (Signed)
Assessment/Plan:     Gabriela Smith is a very pleasant 82 y.o. year old RH female with a history of hypertension, hyperlipidemia, hypothyroidism, CAD status post CABG, AAA, chronic atrial fibrillation on apixaban, r history of aortic stenosis, ecent acute respiratory failure with hypoxia secondary to pulmonary edema on October 2024, seen today for evaluation of memory loss. MoCA today is .  Workup is in progress.    Memory Impairment  MRI brain without contrast to assess for underlying structural abnormality and assess vascular load  Neurocognitive testing to further evaluate cognitive concerns and determine other underlying cause of memory changes, including potential contribution from sleep, anxiety, attention, or depression among others  Check B12, TSH Recommend good control of cardiovascular risk factors.  Continue apixaban, follow-up with cardiology. Continue to control mood as per PCP Folllow up in   Subjective:    The patient is accompanied by ***  who supplements the history.    How long did patient have memory difficulties?  For about.  Patient reports some difficulty remembering new information, recent conversations, names. repeats oneself?  Endorsed Disoriented when walking into a room?  Patient denies ***  Leaving objects in unusual places?  denies   Wandering behavior? denies   Any personality changes, or depression, anxiety? denies*** Hallucinations or paranoia? denies   Seizures? denies    Any sleep changes?  Sleeps well ***/does not sleep well. ***vivid dreams, REM behavior or sleepwalking   Sleep apnea? Denies.   Any hygiene concerns?  Denies.   Independent of bathing and dressing? Endorsed  Does the patient need help with medications?  is in charge *** Who is in charge of the finances?  is in charge   *** Any changes in appetite?   Denies. ***   Patient have trouble swallowing?  Denies.   Does the patient cook? No*** Any headaches?  Denies.   Chronic  pain? Denies.   Ambulates with difficulty? Denies ***  Needs a cane*** Needs a walker *** to ambulate for stability.   Recent falls or head injuries? Denies.     Vision changes?  Denies any new issues.  Has a history of*** Any strokelike symptoms? Denies.   Any tremors? Denies. *** Any anosmia? Denies.   Any incontinence of urine? Denies.   Any bowel dysfunction? Denies.      Patient lives with ***  History of heavy alcohol intake? Denies.   History of heavy tobacco use? Denies.   Family history of dementia?   *** with dementia  Does patient drive? No ***  Allergies  Allergen Reactions   Fish Allergy Hives, Shortness Of Breath and Other (See Comments)    "Bottom-feeder fish"   Fish-Derived Products Hives, Shortness Of Breath and Other (See Comments)    Bottom-feeder fish   Shellfish-Derived Products Hives and Shortness Of Breath   Lasix [Furosemide] Swelling and Other (See Comments)    Edema, elevated BP   Codeine Rash   Latex Itching and Rash    Reports Elastic in underwear, under wire bras, and now surgical cap cause rash and itching.    Macrodantin [Nitrofurantoin Macrocrystal] Other (See Comments)    Unknown reaction    Niacin Other (See Comments)    Muscle aches   Niaspan [Niacin Er] Other (See Comments)    Muscle aches   Statins Other (See Comments)    Muscle aches/ cramps   Sulfa Antibiotics Rash and Other (See Comments)    Muscle aches/ cramps, also   Vicodin [  Hydrocodone-Acetaminophen] Nausea And Vomiting and Other (See Comments)    SEVERE N/V   Zetia [Ezetimibe] Other (See Comments)    Muscle aches    Current Outpatient Medications  Medication Instructions   acetaminophen (TYLENOL) 1,000 mg, Oral, Every 8 hours   amLODipine (NORVASC) 5 mg, Oral, Daily   apixaban (ELIQUIS) 5 mg, Oral, 2 times daily   aspirin EC 81 mg, Oral, Daily   cholecalciferol (VITAMIN D) 1,000 Units, Oral, Daily after breakfast   Co-Enzyme Q10 200 mg, Oral, Daily after breakfast    digoxin (LANOXIN) 125 mcg, Oral, Daily   Evolocumab (REPATHA SURECLICK) 140 MG/ML SOAJ INJECT 140mg  (1ml) EVERY TWO WEEKS AS DIRECTED   Flax Seed Oil 1,000 mg, Oral, Daily after breakfast   furosemide (LASIX) 40 mg, Oral, Daily   GNP VITAMIN B-12 500 MCG tablet TAKE 1 TABLET DAILY   hydrocortisone 1 % lotion 1 application , Topical, 2 times daily   levothyroxine (SYNTHROID) 50 mcg, Oral, Daily   Magnesium 300 mg, Oral, Daily at bedtime   metoprolol tartrate (LOPRESSOR) 100 mg, Oral, 2 times daily, **NEEDS TO BE SEEN BEFORE NEXT REFILL**   nitroGLYCERIN (NITROSTAT) 0.4 MG SL tablet PLACE 1 TABLET UNDER THE TONGUE AT ONSET OF CHEST PAIN EVERY 5 MINTUES UP TO 3 TIMES AS NEEDED   potassium chloride SA (KLOR-CON M) 20 MEQ tablet 20 mEq, Oral, Daily   traMADol (ULTRAM) 50 mg, Oral, Every 6 hours PRN   TURMERIC PO 1 capsule, Oral, Daily after breakfast     VITALS:  There were no vitals filed for this visit.    PHYSICAL EXAM   HEENT:  Normocephalic, atraumatic.  The superficial temporal arteries are without ropiness or tenderness. Cardiovascular: Regular rate and rhythm. Lungs: Clear to auscultation bilaterally. Neck: There are no carotid bruits noted bilaterally.  NEUROLOGICAL:     No data to display             08/25/2022   11:52 AM 12/01/2017    8:17 AM  MMSE - Mini Mental State Exam  Orientation to time 4 5  Orientation to Place 5 5  Registration 2 3  Attention/ Calculation 5 5  Recall 3 3  Language- name 2 objects 2 2  Language- repeat 1 1  Language- follow 3 step command 3 3  Language- read & follow direction 1 1  Write a sentence 1 1  Copy design 1 1  Total score 28 30     Orientation:  Alert and oriented to person, place and not to time***. No aphasia or dysarthria. Fund of knowledge is appropriate. Recent and remote memory impaired.  Attention and concentration are reduced***.  Able to name objects and repeat phrases /5 ***. Delayed recall  / *** Cranial nerves:  There is good facial symmetry. Extraocular muscles are intact and visual fields are full to confrontational testing. Speech is fluent and clear. No tongue deviation. Hearing is intact to conversational tone.*** Tone: Tone is good throughout. Sensation: Sensation is intact to light touch.  Vibration is intact at the bilateral big toe.  Coordination: The patient has no difficulty with RAM's or FNF bilaterally. Normal finger to nose  Motor: Strength is 5/5 in the bilateral upper and lower extremities. There is no pronator drift. There are no fasciculations noted. DTR's: Deep tendon reflexes are 2/4 bilaterally. Gait and Station: The patient is able to ambulate without difficulty. Gait is cautious and narrow. Stride length is normal ***      Thank you for  allowing [place] the opportunity to participate in the care of this nice patient. Please do not hesitate to contact us for any questions or concerns.   Total time spent on today's visit was *** minutes dedicated to this patient today, preparing to see patient, examining the patient, ordering tests and/or medications and counseling the patient, documenting clinical information in the EHR or other health record, independently interpreting results and communicating results to the patient/family, discussing treatment and goals, answering patient's questions and coordinating care.  Cc:  Raliegh Ip, DO  Marlowe Kays 12/22/2022 5:52 PM

## 2022-12-22 NOTE — Progress Notes (Signed)
Cardiology Office Note:    Date:  12/22/2022  ID:  PAVANDEEP LAWLER, DOB 02/02/1941, MRN [id number] PCP: Raliegh Ip, DO  [facility] Providers Cardiologist:  Rollene Rotunda, MD       Patient Profile:      Hyperlipidemia Hypertension Coronary Artery Disease LHC 04/19/2019: Severe multivessel disease with mild ostial and severe distal LEFT MAIN disease involving ostial LCx with severe proximal and mid LAD disease as well as ostial and mid RCA diffuse disease.  CABG x4 04/2019: LIMA to LAD, SVG to Mid LAD, SVG to OM, SVG to PDA Abdominal Aortic Aneurysm HFpEF ECHO 12/13/2022: LVEF 60 to 65%. No RWMA. Mild LVH. Left atrial size severely dilated. Right atrial size moderately dilated. Moderate to severe aortic valve stenosis.  Echo 04/19/2019: LVEF 60 to 65%, no RW MA, normal RVF.  Mild aortic valve stenosis Persistent Atrial Fibrillation  Aortic Atherosclerosis peripheral vascular insufficiency       History of Present Illness:  Discussed the use of AI scribe software for clinical note transcription with the patient, who gave verbal consent to proceed.  Gabriela Smith is a 82 y.o. female who returns for  her 1 year follow up for CAD, CABG, HTN, and HLD.   She has a history of a cardiac cath on 04/19/2019 noting severe multivessel disease, with mild ostial and severe distal left main disease involving the ostial Lcx with severe proximal and mid LAD disease as well as ostial and mid RCA diffuse disease.  It was decided that due to the diffuse disease a CABG was needed.  CABG x 4 with LIMA to LAD, SVG to mid LAD, SVG to OM, and SVG to PDA.   Of note she was seen by her primary on 08/25/2018 for her heart rate was noted to be 127, her PCP increased her Lopressor to 100 mg twice daily.  During the visit family did have concerns for memory issues.  Patient was referred to neurology to rule out Alzheimer's dementia.  On 12/12/2022 she presented to the ED with 1 to 2-week history of  shortness of breath and DOE that significantly worsened in the evening of 12/14/2022.  She noted gradual increase in lower extremity edema over the past month.  In the ED she was noted to have oxygen saturation of 89% room air, she was placed on 2 L.  She was given furosemide 20 mg IV in the ED and admitted for acute respiratory failure with hypoxia secondary to pulmonary edema and congestive heart failure.  Echo during admission showed EF 60 to 65%, no W MAA, normal RVEF, moderate to severe aortic stenosis. She was discharged home with Lasix 40 mg p.o. daily and KCl 20 mEQ.  She was able to be weaned off of 2 L back to room air and maintained oxygen saturation at or greater than 92%.  Today patient comes in the office with daughter.  She notes she is doing overall well from a cardiac standpoint.  Patient notes that since her discharge home she has not been short of breath and has seen a decrease in her leg swelling.  She notes that she is tolerating Lasix with no issues.  Her current weight is 174, 3 pounds down since her admission into the hospital.  We spoke about her echocardiogram that she had in the hospital in regards to her normal ejection fraction and the aortic stenosis that can worsen from mild now to moderate to severe.  We spoke about repeating her  echocardiogram in 1 year unless concerning symptoms arise. The patient also suffers from neuropathy in her feet, particularly her toes, causing significant discomfort.   She denies chest pain, shortness of breath, fatigue, palpitations, melena, hematuria, diaphoresis, weakness, presyncope, syncope, orthopnea, and PND.        Review of Systems  Constitutional: Negative for weight gain.  Cardiovascular:  Positive for leg swelling. Negative for chest pain, claudication, cyanosis, dyspnea on exertion, irregular heartbeat, near-syncope, orthopnea, palpitations, paroxysmal nocturnal dyspnea and syncope.  Respiratory:  Negative for cough, shortness of  breath and wheezing.      See HPI     Studies Reviewed:       Echocardiogram 12/13/2022 1. Left ventricular ejection fraction, by estimation, is 60 to 65%. The  left ventricle has normal function. The left ventricle has no regional  wall motion abnormalities. There is mild left ventricular hypertrophy.  Left ventricular diastolic parameters  are indeterminate.   2. Right ventricular systolic function is normal. The right ventricular  size is normal. There is mildly elevated pulmonary artery systolic  pressure.   3. Left atrial size was severely dilated.   4. Right atrial size was moderately dilated.   5. The mitral valve is degenerative. Mild mitral valve regurgitation. No  evidence of mitral stenosis. Moderate mitral annular calcification.   6. Tricuspid valve regurgitation is moderate.   7. The aortic valve is normal in structure. There is moderate  calcification of the aortic valve. There is moderate thickening of the  aortic valve. Aortic valve regurgitation is not visualized. Moderate to  severe aortic valve stenosis.   8. The inferior vena cava is normal in size with greater than 50%  respiratory variability, suggesting right atrial pressure of 3 mmHg.    Risk Assessment/Calculations:    CHA2DS2-VASc Score = 6  This indicates a 9.7% annual risk of stroke. The patient's score is based upon: CHF History: 1 HTN History: 1 Diabetes History: 0 Stroke History: 0 Vascular Disease History: 1 Age Score: 2 Gender Score: 1            Physical Exam:   VS:  BP 122/84   Pulse 97   Ht 5\' 3"  (1.6 m)   Wt 174 lb 12.8 oz (79.3 kg)   SpO2 94%   BMI 30.96 kg/m    Wt Readings from Last 3 Encounters:  12/22/22 174 lb 12.8 oz (79.3 kg)  12/14/22 175 lb 1.6 oz (79.4 kg)  09/17/22 177 lb (80.3 kg)    Constitutional:      Appearance: Healthy appearance.  Neck:     Vascular: JVD normal.  Pulmonary:     Effort: Pulmonary effort is normal.     Breath sounds: Normal breath  sounds.  Chest:     Chest wall: Not tender to palpatation.  Cardiovascular:     PMI at left midclavicular line. Normal rate. Regular rhythm. Normal S1. Normal S2.      Murmurs: There is no murmur.     No gallop.  No click. No rub.  Pulses:    Intact distal pulses.  Edema:    Peripheral edema present.    Pretibial: bilateral trace edema of the pretibial area.    Ankle: bilateral trace edema of the ankle. Skin:    General: Skin is warm and dry.  Neurological:     General: No focal deficit present.     Mental Status: Alert.  Psychiatric:        Behavior: Behavior is cooperative.  Assessment and Plan:  1.  HFpEF -Admitted 12/12/2022, with acute exacerbation, pulmonary edema, respiratory failure with hypoxia. Discharged home with Lasix 40 mg p.o. daily -Echo 12/13/2022, EF 60 to 65%, no WMA, normal RVF -Currently she denies shortness of breath, leg swelling improved, no weight gain -Daily weights, elevate legs above heart.  Salty 6 info given -Encouraged compression stockings, information [place] emotional support stockings -Will consider SGLT2 if swelling worsens -Continue Lasix 40 mg daily -Will repeat BMP to reassess kidney function   2.  Mixed hyperlipidemia -LDL 33 on 08/25/2022, controlled -Continue Repatha 140 mg  3.  Persistent atrial fibrillation -Rate controlled at 94 bpm -Continue digoxin 0.125 mg daily, metoprolol 100 mg twice daily - CHA2DS2-VASc Score = 6, continue Eliquis 5 mg twice daily  4.  Coronary artery disease  -S/P CABG x 4 with LIMA to LAD, SVG to mid LAD, SVG to OM, and SVG to PDA 04/2019 -No chest pain, palpitations, shortness of breath -No need for ischemic evaluation at this time  5.  Aortic stenosis -Echo 12/13/2022 with moderate to severe aortic stenosis -No chest pain, dyspnea, syncope, lightheadedness -Plan to repeat echocardiogram in 1 year  6.  Essential hypertension -BP today 122/84, controlled -Continue amlodipine 5 mg,  metoprolol 100 mg 2 times daily            Dispo:  Return in about 6 months (around 06/22/2023) for Routine Folow Up.  Signed, Denyce Robert, AGNP-C

## 2022-12-22 NOTE — Patient Instructions (Signed)
Medication Instructions:  The current medical regimen is effective;  continue present plan and medications as directed. Please refer to the Current Medication list given to you today.  *If you need a refill on your cardiac medications before your next appointment, please call your pharmacy*  Lab Work: BMET TODAY If you have labs (blood work) drawn today and your tests are completely normal, you will receive your results only by:    MyChart Message (if you have MyChart) OR  A paper copy in the mail If you have any lab test that is abnormal or we need to change your treatment, we will call you to review the results.  Testing/Procedures: ECHO IN 1 YEAR 2025   Follow-Up: At [facility], you and your health needs are our priority.  As part of our continuing mission to provide you with exceptional heart care, we have created designated Provider Care Teams.  These Care Teams include your primary Cardiologist (physician) and Advanced Practice Providers (APPs -  Physician Assistants and Nurse Practitioners) who all work together to provide you with the care you need, when you need it.  Your next appointment:   6 month(s)  Provider:   Rollene Rotunda, MD  or Edd Fabian, FNP or Rise Paganini, NP         Other Instructions PLEASE READ AND FOLLOW ATTACHED  SALTY 6  RESUME PHYSICAL ACTIVITY PLEASE PURCHASE AND WEAR COMPRESSION STOCKINGS ELEVATE LOWER EXTREMITIES WHEN NOT ACTIVE        [employer]  Outlet Store  Training and development officer for [employer] Mailing Address:  [address];   [address]  [place], [place] [address]  Tel [phone] Fx [phone]     High Quality Legwear for Today's Active Lifestyles Maximum Compression at the ankle. Compression lessens gradually up the leg.   We manufacture a wide range of compression hosiery for men and women in  different styles, constructions and levels of support.  How Compression Hosiery  Works Regulatory affairs officer, [employer]. compression hosiery works by applying graduated pressure to the  muscles and veins in the legs.  When the calf muscle contracts such as during walking  the compression hosiery will "give" and then return to its original position. By doing so  the hosiery is assists your body's circulatory wellness.  The result is increased leg health and vitality.   Maximum Compression at the ankle Compression lessens gradually up the leg  We Offer: Sheer & Opaque Stockings       COLORS:  Nude, black, white and misc. prints Below Knee Thigh High Pantyhose  High Quality Legwear for Today's Active Lifestyles We manufacture a wide range of compression hosiery for men and women in different styles, construction sand levels of support.  Socks:                     Sheer & Opaque   Compression Levels Include:                                  Stockings Men's               Below Knee                8-15 mmHg   Women's         Thigh High  15-20 mmHg  Unisex             Pantyhose                 20-30 mmHg                                                                      30-40 mmHg  4 Simple Ways to Order   Email  [email or web address] Mail/Email orders are subject to processing and handling charges. Allow 7-10 days for receipt.  Phone [phone]  Please allow 24 hours for return call.   In Person  We recommend calling prior to your visit to confirm store hours as they may change due to holiday, weather, and maintenance.   By Mail When placing an order, please have the following information available. Our representatives are available to assist.     Measurements    THIGH      in.   CALF        in.   ANKLE     in.    Compression  8-15 mmHg >>15-20 mmHg**   20-30 mmHg 30-40 mmHg   WOMEN'S MEN'S  Shoe Size Sock Size Shoe Size Sock Size  4 - 5 Small 7.5 and Under Small  5.5 - 7.5 Medium 8 - 10 Medium  8 - 10 Large 10.5 - 12 Large  10.5 and Over  X-Large 12.5 and Over X-Large   Knee High Size Chart  Length from CALF MEASUREMENT  floor to bend   in knee. 11" 12" 13" 14" 15" 16" 17" 18" 19" 20" 21" 22"  14" S S S S M M L L L XL XL XL  15" S S S M M L L L XL XL XXL XXL  16" S S M M M L L L XL XXL XXL XXL  17" S M M M M L L XL XL Cherrie Gauze  18" M M M M L L L XL XL XXL XXL XXL  19" M M M M L L XL XL XL XXL XXL XXL   Thigh High Circumference Sizing Chart                 S M L XL XXL  ANKLE 6.5" - 8" 8" - 9.5" 9.5" - 11" 11" - 12.5" 12.5" - 14"  CALF 10.5" - 14.5" 11.5" - 15.5" 12.5" - 17" 13.5" - 17.5" 14.5" - 19.5"  THIGH 15.5" - 22" 17.5" - 24" 19.5" - 26" 22" - 28" 26" - 32"  HIP UP TO 40" UP TO 44" UP TO 48' UP TO 52" UP TO 56"   Pantyhose Size Chart  Height Petite Medium Tall X-Tall [place] Queen +   Weight Weight Weight Weight Weight Weight  4'11" 95-130 135      5'0 95-125 130-145   170-185   5'1" 90-120 125-155 160-165  170-195   5'2" 90-115 120-145 150-165  170-195   5'3" 90-110 115-140 145-165  170-200 200-225  5'4" 100-105 110-135 140-160 165 170-200 200-225  5'5" 100 105-130 135-160 165 170-200 200-225  5'6"  110-125 130-155 160-165 170-200 200-225  5'7"  110-120 125-150 155-165 170-200 195-225  5'8"   120-145 150-165  170-200 190-225  5'9"   125-140 145-170 175-190 185-220  5'10"   125-135 140-185  185-215  5'11"   130-135 140-185  190-210      

## 2022-12-23 ENCOUNTER — Ambulatory Visit: Payer: Medicare Other | Admitting: Physician Assistant

## 2022-12-23 ENCOUNTER — Encounter: Payer: Self-pay | Admitting: Physician Assistant

## 2022-12-23 ENCOUNTER — Ambulatory Visit: Payer: Medicare Other

## 2022-12-23 ENCOUNTER — Other Ambulatory Visit (INDEPENDENT_AMBULATORY_CARE_PROVIDER_SITE_OTHER): Payer: Medicare Other

## 2022-12-23 VITALS — BP 137/83 | HR 78 | Resp 18 | Ht 63.0 in | Wt 176.0 lb

## 2022-12-23 DIAGNOSIS — R413 Other amnesia: Secondary | ICD-10-CM | POA: Insufficient documentation

## 2022-12-23 LAB — BASIC METABOLIC PANEL
BUN/Creatinine Ratio: 16 (ref 12–28)
BUN: 14 mg/dL (ref 8–27)
CO2: 26 mmol/L (ref 20–29)
Calcium: 10 mg/dL (ref 8.7–10.3)
Chloride: 100 mmol/L (ref 96–106)
Creatinine, Ser: 0.86 mg/dL (ref 0.57–1.00)
Glucose: 101 mg/dL — ABNORMAL HIGH (ref 70–99)
Potassium: 4.6 mmol/L (ref 3.5–5.2)
Sodium: 142 mmol/L (ref 134–144)
eGFR: 67 mL/min/{1.73_m2} (ref >59)

## 2022-12-23 LAB — TSH: TSH: 1.89 u[IU]/mL (ref 0.35–5.50)

## 2022-12-23 LAB — VITAMIN B12: Vitamin B-12: 1184 pg/mL — ABNORMAL HIGH (ref 211–911)

## 2022-12-23 MED ORDER — DONEPEZIL HCL 5 MG PO TABS: 5.0000 mg | ORAL_TABLET | Freq: Every day | ORAL | 11 refills | Status: DC

## 2022-12-23 NOTE — Patient Instructions (Addendum)
It was a pleasure to see you today at our office.   Recommendations:  Neurocognitive evaluation at our office   MRI of the brain, the radiology office will call you to arrange you appointment   Check labs today   Follow up in Nov 21 at 9 am     For psychiatric meds, mood meds: Please have your primary care physician manage these medications.  If you have any severe symptoms of a stroke, or other severe issues such as confusion,severe chills or fever, etc call 911 or go to the ER as you may need to be evaluated further   For guidance regarding [facility] and if placement were needed at the facility, contact Social Worker tel: [phone]  For assessment of decision of mental capacity and competency:  Call Dr. Erick Blinks, geriatric psychiatrist at [phone]  Counseling regarding caregiver distress, including caregiver depression, anxiety and issues regarding community resources, adult day care programs, adult living facilities, or memory care questions:  please contact your  Primary Doctor's Social Worker   Whom to call: Memory  decline, memory medications: Call our office [phone]    [email or web address]   RECOMMENDATIONS FOR ALL PATIENTS WITH MEMORY PROBLEMS: 1. Continue to exercise (Recommend 30 minutes of walking everyday, or 3 hours every week) 2. Increase social interactions - continue going to [place] and enjoy social gatherings with friends and family 3. Eat healthy, avoid fried foods and eat more fruits and vegetables 4. Maintain adequate blood pressure, blood sugar, and blood cholesterol level. Reducing the risk of stroke and cardiovascular disease also helps promoting better memory. 5. Avoid stressful situations. Live a simple life and avoid aggravations. Organize your time and prepare for the next day in anticipation. 6. Sleep well, avoid any interruptions of sleep and avoid any distractions  in the bedroom that may interfere with adequate sleep quality 7. Avoid sugar, avoid sweets as there is a strong link between excessive sugar intake, diabetes, and cognitive impairment We discussed the Mediterranean diet, which has been shown to help patients reduce the risk of progressive memory disorders and reduces cardiovascular risk. This includes eating fish, eat fruits and green leafy vegetables, nuts like almonds and hazelnuts, walnuts, and also use olive oil. Avoid fast foods and fried foods as much as possible. Avoid sweets and sugar as sugar use has been linked to worsening of memory function.  There is always a concern of gradual progression of memory problems. If this is the case, then we may need to adjust level of care according to patient needs. Support, both to the patient and caregiver, should then be put into place.      You have been referred for a neuropsychological evaluation (i.e., evaluation of memory and thinking abilities). Please bring someone with you to this appointment if possible, as it is helpful for the doctor to hear from both you and another adult who knows you well. Please bring eyeglasses and hearing aids if you wear them.    The evaluation will take approximately 3 hours and has two parts:   The first part is a clinical interview with the neuropsychologist (Dr. Milbert Coulter or Dr. Roseanne Reno). During the interview, the neuropsychologist will speak with you and the individual you brought to the appointment.    The second part of the evaluation is testing with the doctor's technician Annabelle Harman or Selena Batten). During the testing, the technician will ask you to remember different types of material, solve problems, and answer some questionnaires. Your family member  will not be present for this portion of the evaluation.   Please note: We must reserve several hours of the neuropsychologist's time and the psychometrician's time for your evaluation appointment. As such, there is a No-Show fee  of $100. If you are unable to attend any of your appointments, please contact our office as soon as possible to reschedule.      DRIVING: Regarding driving, in patients with progressive memory problems, driving will be impaired. We advise to have someone else do the driving if trouble finding directions or if minor accidents are reported. Independent driving assessment is available to determine safety of driving.   If you are interested in the driving assessment, you can contact the following:  [facility] [employer] in [place] [phone]  [facility] [phone]  [facility] [phone]  [facility] [phone] or [phone]   FALL PRECAUTIONS: Be cautious when walking. Scan the area for obstacles that may increase the risk of trips and falls. When getting up in the mornings, sit up at the edge of the bed for a few minutes before getting out of bed. Consider elevating the bed at the head end to avoid drop of blood pressure when getting up. Walk always in a well-lit room (use night lights in the walls). Avoid area rugs or power cords from appliances in the middle of the walkways. Use a walker or a cane if necessary and consider physical therapy for balance exercise. Get your eyesight checked regularly.  FINANCIAL OVERSIGHT: Supervision, especially oversight when making financial decisions or transactions is also recommended.  HOME SAFETY: Consider the safety of the kitchen when operating appliances like stoves, microwave oven, and blender. Consider having supervision and share cooking responsibilities until no longer able to participate in those. Accidents with firearms and other hazards in the house should be identified and addressed as well.   ABILITY TO BE LEFT ALONE: If patient is unable to contact 911 operator, consider using LifeLine, or when the need is there, arrange for someone to stay with patients. Smoking is a fire hazard, consider  supervision or cessation. Risk of wandering should be assessed by caregiver and if detected at any point, supervision and safe proof recommendations should be instituted.  MEDICATION SUPERVISION: Inability to self-administer medication needs to be constantly addressed. Implement a mechanism to ensure safe administration of the medications.      Mediterranean Diet A Mediterranean diet refers to food and lifestyle choices that are based on the traditions of countries located on the [employer]. This way of eating has been shown to help prevent certain conditions and improve outcomes for people who have chronic diseases, like kidney disease and heart disease. What are tips for following this plan? Lifestyle  Cook and eat meals together with your family, when possible. Drink enough fluid to keep your urine clear or pale yellow. Be physically active every day. This includes: Aerobic exercise like running or swimming. Leisure activities like gardening, walking, or housework. Get 7-8 hours of sleep each night. If recommended by your health care provider, drink red wine in moderation. This means 1 glass a day for nonpregnant women and 2 glasses a day for men. A glass of wine equals 5 oz (150 mL). Reading food labels  Check the serving size of packaged foods. For foods such as rice and pasta, the serving size refers to the amount of cooked product, not dry. Check the total fat in packaged foods. Avoid foods that have saturated fat or trans fats.  Check the ingredients list for added sugars, such as corn syrup. Shopping  At the grocery store, buy most of your food from the areas near the walls of the store. This includes: Fresh fruits and vegetables (produce). Grains, beans, nuts, and seeds. Some of these may be available in unpackaged forms or large amounts (in bulk). Fresh seafood. Poultry and eggs. Low-fat dairy products. Buy whole ingredients instead of prepackaged foods. Buy fresh  fruits and vegetables in-season from local farmers markets. Buy frozen fruits and vegetables in resealable bags. If you do not have access to quality fresh seafood, buy precooked frozen shrimp or canned fish, such as tuna, salmon, or sardines. Buy small amounts of raw or cooked vegetables, salads, or olives from the deli or salad bar at your store. Stock your pantry so you always have certain foods on hand, such as olive oil, canned tuna, canned tomatoes, rice, pasta, and beans. Cooking  Cook foods with extra-virgin olive oil instead of using butter or other vegetable oils. Have meat as a side dish, and have vegetables or grains as your main dish. This means having meat in small portions or adding small amounts of meat to foods like pasta or stew. Use beans or vegetables instead of meat in common dishes like chili or lasagna. Experiment with different cooking methods. Try roasting or broiling vegetables instead of steaming or sauteing them. Add frozen vegetables to soups, stews, pasta, or rice. Add nuts or seeds for added healthy fat at each meal. You can add these to yogurt, salads, or vegetable dishes. Marinate fish or vegetables using olive oil, lemon juice, garlic, and fresh herbs. Meal planning  Plan to eat 1 vegetarian meal one day each week. Try to work up to 2 vegetarian meals, if possible. Eat seafood 2 or more times a week. Have healthy snacks readily available, such as: Vegetable sticks with hummus. Greek yogurt. Fruit and nut trail mix. Eat balanced meals throughout the week. This includes: Fruit: 2-3 servings a day Vegetables: 4-5 servings a day Low-fat dairy: 2 servings a day Fish, poultry, or lean meat: 1 serving a day Beans and legumes: 2 or more servings a week Nuts and seeds: 1-2 servings a day Whole grains: 6-8 servings a day Extra-virgin olive oil: 3-4 servings a day Limit red meat and sweets to only a few servings a month What are my food choices? Mediterranean  diet Recommended Grains: Whole-grain pasta. Brown rice. Bulgar wheat. Polenta. Couscous. Whole-wheat bread. Orpah Cobb. Vegetables: Artichokes. Beets. Broccoli. Cabbage. Carrots. Eggplant. Green beans. Chard. Kale. Spinach. Onions. Leeks. Peas. Squash. Tomatoes. Peppers. Radishes. Fruits: Apples. Apricots. Avocado. Berries. Bananas. Cherries. Dates. Figs. Grapes. Lemons. Melon. Oranges. Peaches. Plums. Pomegranate. Meats and other protein foods: Beans. Almonds. Sunflower seeds. Pine nuts. Peanuts. Cod. Salmon. Scallops. Shrimp. Tuna. Tilapia. Clams. Oysters. Eggs. Dairy: Low-fat milk. Cheese. Greek yogurt. Beverages: Water. Red wine. Herbal tea. Fats and oils: Extra virgin olive oil. Avocado oil. Grape seed oil. Sweets and desserts: [place] yogurt with honey. Baked apples. Poached pears. Trail mix. Seasoning and other foods: Basil. Cilantro. Coriander. Cumin. Mint. Parsley. Sage. Rosemary. Tarragon. Garlic. Oregano. Thyme. Pepper. Balsalmic vinegar. Tahini. Hummus. Tomato sauce. Olives. Mushrooms. Limit these Grains: Prepackaged pasta or rice dishes. Prepackaged cereal with added sugar. Vegetables: Deep fried potatoes (french fries). Fruits: Fruit canned in syrup. Meats and other protein foods: Beef. Pork. Lamb. Poultry with skin. Hot dogs. Tomasa Blase. Dairy: Ice cream. Sour cream. Whole milk. Beverages: Juice. Sugar-sweetened soft drinks. Beer. Liquor and spirits. Fats and oils:  Butter. Canola oil. Vegetable oil. Beef fat (tallow). Lard. Sweets and desserts: Cookies. Cakes. Pies. Candy. Seasoning and other foods: Mayonnaise. Premade sauces and marinades. The items listed may not be a complete list. Talk with your dietitian about what dietary choices are right for you. Summary The Mediterranean diet includes both food and lifestyle choices. Eat a variety of fresh fruits and vegetables, beans, nuts, seeds, and whole grains. Limit the amount of red meat and sweets that you eat. Talk with your  health care provider about whether it is safe for you to drink red wine in moderation. This means 1 glass a day for nonpregnant women and 2 glasses a day for men. A glass of wine equals 5 oz (150 mL). This information is not intended to replace advice given to you by your health care provider. Make sure you discuss any questions you have with your health care provider. Document Released: 10/18/2015 Document Revised: 11/20/2015 Document Reviewed: 10/18/2015 Elsevier Interactive Patient Education  2017 [employer].

## 2022-12-23 NOTE — Progress Notes (Signed)
B12 is normal, continue taking B12 but decrease to 500 mg daily. Thyroid is normal, follow with PCP

## 2022-12-25 ENCOUNTER — Other Ambulatory Visit: Payer: Self-pay | Admitting: *Deleted

## 2022-12-25 DIAGNOSIS — I739 Peripheral vascular disease, unspecified: Secondary | ICD-10-CM

## 2023-01-01 ENCOUNTER — Encounter: Payer: Medicare Other | Admitting: Vascular Surgery

## 2023-01-01 ENCOUNTER — Encounter (HOSPITAL_COMMUNITY): Payer: Medicare Other

## 2023-01-02 ENCOUNTER — Ambulatory Visit
Admission: RE | Admit: 2023-01-02 | Discharge: 2023-01-02 | Disposition: A | Discharge status: Home / Self Care | Payer: Medicare Other | Source: Ambulatory Visit | Attending: Physician Assistant | Admitting: Physician Assistant

## 2023-01-02 DIAGNOSIS — R413 Other amnesia: Secondary | ICD-10-CM | POA: Diagnosis not present

## 2023-01-05 NOTE — Progress Notes (Unsigned)
Patient name: Gabriela Smith MRN: [id number] DOB: 1940/04/05 Sex: female  REASON FOR CONSULT: Constant toe pain with concern for arterial and venous insufficiency  HPI: Gabriela Smith is a 82 y.o. female, with history of hypertension, hyperlipidemia, coronary artery disease, atrial fibrillation that presents for evaluation of constant toe pain with concern for arterial and venous insufficiency.  In discussing with the patient and her daughter her bigger concern is leg swelling.  This has been ongoing for the last 3 years since her CABG.  She gets swelling in both lower extremities.  She states her toes do not hurt but she has a tingling feeling in both great toes.  No pain in her legs when she walks.  No open wounds.  Pretty sedentary.  Past Medical History:  Diagnosis Date   AAA (abdominal aortic aneurysm) [facility])    cardiology aware    Arthritis    OA lt knee- cortizone inj. q 4 months AND OA ALSO IN BACK. Had knee replaced-issue resolved   CAD (coronary artery disease) CARDIOLOGIST- DR [facility]--  VISIT 01-02-10 IN EPIC   1993-- PTCA. Pt describes a near total blockage of apparently the LAD and a 65% stenosis elsewhere.   Cancer [facility])    History of bladder cancer followed by dr Vernie Ammons   hx  TCC of bladder ,  Ta G3-  first occurence 02-20-2012--  s/p BCG tx's  and  mitomycin C   Hyperlipidemia 1993   Hypertension 1993   Nocturia    Osteopenia    Sigmoid diverticulosis     Past Surgical History:  Procedure Laterality Date   CHOLECYSTECTOMY  2003 (approx)   CLIPPING OF ATRIAL APPENDAGE N/A 04/25/2019   Procedure: Clipping Of Atrial Appendage;  Surgeon: Corliss Skains, MD;  Location: [facility] OR;  Service: Open Heart Surgery;  Laterality: N/A;   COLONOSCOPY  05-31-2003   CORONARY ANGIOPLASTY  1993   to LAD   CORONARY ARTERY BYPASS GRAFT N/A 04/25/2019   Procedure: CORONARY ARTERY BYPASS GRAFTING (CABG) times four using left internal mammary artery and left greater saphenous vein  harvested endoscipically.;  Surgeon: Corliss Skains, MD;  Location: [facility] OR;  Service: Open Heart Surgery;  Laterality: N/A;   CYSTOSCOPY WITH BIOPSY  05/09/2011   Procedure: CYSTOSCOPY WITH BIOPSY;  Surgeon: Garnett Farm, MD;  Location: [facility];  Service: Urology;  Laterality: N/A;  WITH BLADDER BIOPSY GYRUS   LEFT HEART CATH AND CORONARY ANGIOGRAPHY N/A 04/19/2019   Procedure: LEFT HEART CATH AND CORONARY ANGIOGRAPHY;  Surgeon: Marykay Lex, MD;  Location: [facility] INVASIVE CV LAB;  Service: Cardiovascular;  Laterality: N/A;   TOTAL HIP ARTHROPLASTY Right 12/25/2015   Procedure: RIGHT TOTAL HIP ARTHROPLASTY ANTERIOR APPROACH;  Surgeon: Durene Romans, MD;  Location: [facility] ORS;  Service: Orthopedics;  Laterality: Right;   TOTAL KNEE ARTHROPLASTY Left 11/22/2012   Procedure: LEFT TOTAL KNEE ARTHROPLASTY;  Surgeon: Shelda Pal, MD;  Location: [facility] ORS;  Service: Orthopedics;  Laterality: Left;   TRANSURETHRAL RESECTION OF BLADDER TUMOR  03/14/2011   Procedure: TRANSURETHRAL RESECTION OF BLADDER TUMOR (TURBT);  Surgeon: Garnett Farm, MD;  Location: [facility];  Service: Urology;  Laterality: N/A;   TRANSURETHRAL RESECTION OF BLADDER TUMOR  05/09/2011   Procedure: TRANSURETHRAL RESECTION OF BLADDER TUMOR (TURBT);  Surgeon: Garnett Farm, MD;  Location: [facility];  Service: Urology;  Laterality: N/A;   TRANSURETHRAL RESECTION OF BLADDER TUMOR  03/08/2012   Procedure: TRANSURETHRAL  RESECTION OF BLADDER TUMOR (TURBT);  Surgeon: Garnett Farm, MD;  Location: [facility];  Service: Urology;  Laterality: N/A;   TRANSURETHRAL RESECTION OF BLADDER TUMOR WITH GYRUS (TURBT-GYRUS) N/A 03/24/2014   Procedure: TRANSURETHRAL RESECTION OF BLADDER TUMOR WITH GYRUS (TURBT-GYRUS);  Surgeon: Garnett Farm, MD;  Location: [facility];  Service: Urology;  Laterality: N/A;   UMBILICAL HERNIA REPAIR  2001  (approx)    Family History  Problem  Relation Age of Onset   Coronary artery disease Father 50   Hyperlipidemia Father    Heart attack Mother 52   Hyperlipidemia Mother    Osteoporosis Sister    Hyperlipidemia Sister    Heart attack Daughter 34       CABG x 5   Heart disease Son 68       Stents x 3    SOCIAL HISTORY: Social History   Socioeconomic History   Marital status: Married    Spouse name: Fayrene Fearing   Number of children: 3   Years of education: 12   Highest education level: High school graduate  Occupational History   Occupation: retired  Tobacco Use   Smoking status: Former    Current packs/day: 0.00    Average packs/day: 0.5 packs/day for 5.0 years (2.5 ttl pk-yrs)    Types: Cigarettes    Start date: 03/10/1971    Quit date: 03/09/1976    Years since quitting: 46.8   Smokeless tobacco: Never  Vaping Use   Vaping status: Never Used  Substance and Sexual Activity   Alcohol use: Yes    Alcohol/week: 4.0 standard drinks of alcohol    Types: 4 Glasses of wine per week    Comment: red wine   Drug use: No   Sexual activity: Not Currently  Other Topics Concern   Not on file  Social History Narrative   Married with 3 children and 4 grandchildren.  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 with Parkinsons   Lives in garage apartment at her daughters home.   Right handed   Drinks caffeine prn   retired   Chemical engineer Strain: Low Risk  (02/04/2022)   Overall Financial Resource Strain (CARDIA)    Difficulty of Paying Living Expenses: Not hard at all  Food Insecurity: No Food Insecurity (12/15/2022)   Hunger Vital Sign    Worried About Running Out of Food in the Last Year: Never true    Ran Out of Food in the Last Year: Never true  Transportation Needs: No Transportation Needs (12/15/2022)   PRAPARE - Administrator, Civil Service (Medical): No    Lack of Transportation (Non-Medical): No  Physical Activity: Insufficiently Active (02/04/2022)   Exercise Vital Sign     Days of Exercise per Week: 3 days    Minutes of Exercise per Session: 30 min  Stress: No Stress Concern Present (02/04/2022)   [employer] of Occupational Health - Occupational Stress Questionnaire    Feeling of Stress : Not at all  Social Connections: Moderately Integrated (02/04/2022)   Social Connection and Isolation Panel [NHANES]    Frequency of Communication with Friends and Family: More than three times a week    Frequency of Social Gatherings with Friends and Family: More than three times a week    Attends Religious Services: 1 to 4 times per year    Active Member of [employer] or Organizations: No    Attends Banker Meetings: Never  Marital Status: Married  Catering manager Violence: Not At Risk (12/12/2022)   Humiliation, Afraid, Rape, and Kick questionnaire    Fear of Current or Ex-Partner: No    Emotionally Abused: No    Physically Abused: No    Sexually Abused: No    Allergies  Allergen Reactions   Fish Allergy Hives, Shortness Of Breath and Other (See Comments)    "Bottom-feeder fish"   Fish-Derived Products Hives, Shortness Of Breath and Other (See Comments)    Bottom-feeder fish   Shellfish-Derived Products Hives and Shortness Of Breath   Lasix [Furosemide] Swelling and Other (See Comments)    Edema, elevated BP   Codeine Rash   Latex Itching and Rash    Reports Elastic in underwear, under wire bras, and now surgical cap cause rash and itching.    Macrodantin [Nitrofurantoin Macrocrystal] Other (See Comments)    Unknown reaction    Niacin Other (See Comments)    Muscle aches   Niaspan [Niacin Er (Antihyperlipidemic)] Other (See Comments)    Muscle aches   Statins Other (See Comments)    Muscle aches/ cramps   Sulfa Antibiotics Rash and Other (See Comments)    Muscle aches/ cramps, also   Vicodin [Hydrocodone-Acetaminophen] Nausea And Vomiting and Other (See Comments)    SEVERE N/V   Zetia [Ezetimibe] Other (See Comments)    Muscle aches     Current Outpatient Medications  Medication Sig Dispense Refill   acetaminophen (TYLENOL) 500 MG tablet Take 2 tablets (1,000 mg total) by mouth every 8 (eight) hours. (Patient taking differently: Take 1,000 mg by mouth every 6 (six) hours as needed for moderate pain (pain score 4-6).) 30 tablet 0   amLODipine (NORVASC) 5 MG tablet Take 1 tablet (5 mg total) by mouth daily. 90 tablet 3   apixaban (ELIQUIS) 5 MG TABS tablet Take 1 tablet (5 mg total) by mouth 2 (two) times daily. 180 tablet 3   aspirin EC 81 MG EC tablet Take 1 tablet (81 mg total) by mouth daily.     cholecalciferol (VITAMIN D) 1000 UNITS tablet Take 1,000 Units by mouth daily after breakfast.      Co-Enzyme Q10 200 MG CAPS Take 200 mg by mouth daily after breakfast.      digoxin (LANOXIN) 0.125 MG tablet TAKE ONE TABLET BY MOUTH DAILY 90 tablet 2   donepezil (ARICEPT) 5 MG tablet Take 1 tablet (5 mg total) by mouth daily. 30 tablet 11   Evolocumab (REPATHA SURECLICK) 140 MG/ML SOAJ INJECT 140mg  (1ml) EVERY TWO WEEKS AS DIRECTED 2 mL 11   Flaxseed, Linseed, (FLAX SEED OIL) 1000 MG CAPS Take 1,000 mg by mouth daily after breakfast.      furosemide (LASIX) 40 MG tablet Take 1 tablet (40 mg total) by mouth daily. 30 tablet 1   GNP VITAMIN B-12 500 MCG tablet TAKE 1 TABLET DAILY 90 tablet 0   hydrocortisone 1 % lotion Apply 1 application. topically 2 (two) times daily. (Patient taking differently: Apply 1 application  topically 2 (two) times daily as needed for itching (Dermatitis).) 118 mL 0   levothyroxine (SYNTHROID) 50 MCG tablet Take 1 tablet (50 mcg total) by mouth daily. 100 tablet 3   Magnesium 300 MG CAPS Take 300 mg by mouth at bedtime.      metoprolol tartrate (LOPRESSOR) 100 MG tablet Take 1 tablet (100 mg total) by mouth 2 (two) times daily. **NEEDS TO BE SEEN BEFORE NEXT REFILL** 60 tablet 0   nitroGLYCERIN (NITROSTAT) 0.4  MG SL tablet PLACE 1 TABLET UNDER THE TONGUE AT ONSET OF CHEST PAIN EVERY 5 MINTUES UP TO 3  TIMES AS NEEDED 25 tablet 0   potassium chloride SA (KLOR-CON M) 20 MEQ tablet Take 1 tablet (20 mEq total) by mouth daily. 30 tablet 1   traMADol (ULTRAM) 50 MG tablet Take 50 mg by mouth every 6 (six) hours as needed for moderate pain. (Patient not taking: Reported on 12/23/2022)     TURMERIC PO Take 1 capsule by mouth daily after breakfast.     No current facility-administered medications for this visit.   Facility-Administered Medications Ordered in Other Visits  Medication Dose Route Frequency Provider Last Rate Last Admin   DOBUTamine (DOBUTREX) 1,000 mcg/mL in dextrose 5% 250 mL infusion  20 mcg/kg/min Intravenous Continuous Lars Masson, MD 96.4 mL/hr at 12/24/15 0840 20 mcg/kg/min at 12/24/15 0840    REVIEW OF SYSTEMS:  [X]  denotes positive finding, [ ]  denotes negative finding Cardiac  Comments:  Chest pain or chest pressure:    Shortness of breath upon exertion:    Short of breath when lying flat:    Irregular heart rhythm:        Vascular    Pain in calf, thigh, or hip brought on by ambulation:    Pain in feet at night that wakes you up from your sleep:     Blood clot in your veins:    Leg swelling:  x       Pulmonary    Oxygen at home:    Productive cough:     Wheezing:         Neurologic    Sudden weakness in arms or legs:     Sudden numbness in arms or legs:     Sudden onset of difficulty speaking or slurred speech:    Temporary loss of vision in one eye:     Problems with dizziness:         Gastrointestinal    Blood in stool:     Vomited blood:         Genitourinary    Burning when urinating:     Blood in urine:        Psychiatric    Major depression:         Hematologic    Bleeding problems:    Problems with blood clotting too easily:        Skin    Rashes or ulcers:        Constitutional    Fever or chills:      PHYSICAL EXAM: There were no vitals filed for this visit.  GENERAL: The patient is a well-nourished female, in no acute  distress. The vital signs are documented above. CARDIAC: There is a regular rate and rhythm.  VASCULAR:  Palpable femoral pulses No palpable pedal pulses but she has brisk multiphasic DP and PT signals bilaterally No tissue loss Notable lower extremity edema bilaterally with multiple reticular and spider veins PULMONARY: There is good air exchange bilaterally without wheezing or rales. ABDOMEN: Soft and non-tender with normal pitched bowel sounds.  MUSCULOSKELETAL: There are no major deformities or cyanosis. NEUROLOGIC: No focal weakness or paresthesias are detected. SKIN: There are no ulcers or rashes noted. PSYCHIATRIC: The patient has a normal affect.  DATA:   ABIs today 0.80 right and 0.88 left  Assessment/Plan:  82 y.o. female, with history of hypertension, hyperlipidemia, coronary artery disease, atrial fibrillation that presents for evaluation of constant toe pain  with concern for arterial and venous insufficiency.  I discussed that her arterial studies are mildly abnormal suggesting she has some mild underlying PAD.  She does have very brisk Doppler signals.  She has no pain in her legs when she walks to suggest she has claudication and I do not see any wounds to suggest CLI.  I do not think her toe pain is related to significant arterial insufficiency as she describes more of a tingling feeling and I think this can be observed.  I think her bigger issue is chronic venous insufficiency.  She has venous stasis changes on exam.  I discussed leg elevation, exercise, elevation and we got her sized for medical grade compression stockings today.  I discussed conservative measures.  I will bring her back in 1 year for repeat ABIs just to monitor her mild PAD.   Cephus Shelling, MD [facility] Office: [phone]

## 2023-01-06 ENCOUNTER — Encounter: Payer: Self-pay | Admitting: Vascular Surgery

## 2023-01-06 ENCOUNTER — Ambulatory Visit (HOSPITAL_COMMUNITY)
Admission: RE | Admit: 2023-01-06 | Discharge: 2023-01-06 | Disposition: A | Discharge status: Home / Self Care | Payer: Medicare Other | Source: Ambulatory Visit | Attending: Vascular Surgery | Admitting: Vascular Surgery

## 2023-01-06 ENCOUNTER — Ambulatory Visit: Payer: Medicare Other | Admitting: Vascular Surgery

## 2023-01-06 VITALS — BP 170/80 | HR 92 | Temp 97.7°F | Wt 177.0 lb

## 2023-01-06 DIAGNOSIS — I739 Peripheral vascular disease, unspecified: Secondary | ICD-10-CM | POA: Insufficient documentation

## 2023-01-06 DIAGNOSIS — I872 Venous insufficiency (chronic) (peripheral): Secondary | ICD-10-CM | POA: Diagnosis not present

## 2023-01-06 DIAGNOSIS — M7989 Other specified soft tissue disorders: Secondary | ICD-10-CM

## 2023-01-06 HISTORY — DX: Venous insufficiency (chronic) (peripheral): I87.2

## 2023-01-06 HISTORY — DX: Peripheral vascular disease, unspecified: I73.9

## 2023-01-06 LAB — VAS US ABI WITH/WO TBI
Left ABI: 0.88
Right ABI: 0.8

## 2023-01-12 DIAGNOSIS — Z882 Allergy status to sulfonamides status: Secondary | ICD-10-CM | POA: Diagnosis not present

## 2023-01-12 DIAGNOSIS — Z87891 Personal history of nicotine dependence: Secondary | ICD-10-CM | POA: Diagnosis not present

## 2023-01-12 DIAGNOSIS — Z20822 Contact with and (suspected) exposure to covid-19: Secondary | ICD-10-CM | POA: Diagnosis not present

## 2023-01-12 DIAGNOSIS — R918 Other nonspecific abnormal finding of lung field: Secondary | ICD-10-CM | POA: Diagnosis not present

## 2023-01-12 DIAGNOSIS — R0602 Shortness of breath: Secondary | ICD-10-CM | POA: Diagnosis not present

## 2023-01-12 DIAGNOSIS — Z79899 Other long term (current) drug therapy: Secondary | ICD-10-CM | POA: Diagnosis not present

## 2023-01-12 DIAGNOSIS — Z7982 Long term (current) use of aspirin: Secondary | ICD-10-CM | POA: Diagnosis not present

## 2023-01-12 DIAGNOSIS — I110 Hypertensive heart disease with heart failure: Secondary | ICD-10-CM | POA: Diagnosis not present

## 2023-01-12 DIAGNOSIS — Z91040 Latex allergy status: Secondary | ICD-10-CM | POA: Diagnosis not present

## 2023-01-12 DIAGNOSIS — Z743 Need for continuous supervision: Secondary | ICD-10-CM | POA: Diagnosis not present

## 2023-01-12 DIAGNOSIS — Z91013 Allergy to seafood: Secondary | ICD-10-CM | POA: Diagnosis not present

## 2023-01-12 DIAGNOSIS — E079 Disorder of thyroid, unspecified: Secondary | ICD-10-CM | POA: Diagnosis not present

## 2023-01-12 DIAGNOSIS — R6889 Other general symptoms and signs: Secondary | ICD-10-CM | POA: Diagnosis not present

## 2023-01-12 DIAGNOSIS — Z885 Allergy status to narcotic agent status: Secondary | ICD-10-CM | POA: Diagnosis not present

## 2023-01-12 DIAGNOSIS — R000 Tachycardia, unspecified: Secondary | ICD-10-CM | POA: Diagnosis not present

## 2023-01-12 DIAGNOSIS — I509 Heart failure, unspecified: Secondary | ICD-10-CM | POA: Diagnosis not present

## 2023-01-12 DIAGNOSIS — I499 Cardiac arrhythmia, unspecified: Secondary | ICD-10-CM | POA: Diagnosis not present

## 2023-01-13 ENCOUNTER — Telehealth: Payer: Self-pay

## 2023-01-13 NOTE — Transitions of Care (Post Inpatient/ED Visit) (Signed)
   01/13/2023  Name: Gabriela Smith MRN: [id number] DOB: [birth year]  Today's TOC FU Call Status: Today's TOC FU Call Status:: Unsuccessful Call (1st Attempt) Unsuccessful Call (1st Attempt) Date: 01/13/23  Attempted to reach the patient regarding the most recent Inpatient/ED visit.  Follow Up Plan: Additional outreach attempts will be made to reach the patient to complete the Transitions of Care (Post Inpatient/ED visit) call.   Signature Karena Addison, LPN [facility] Nurse Health Advisor Direct Dial [phone]

## 2023-01-15 NOTE — Transitions of Care (Post Inpatient/ED Visit) (Signed)
   01/15/2023  Name: Gabriela Smith MRN: [id number] DOB: [birth year]  Today's TOC FU Call Status: Today's TOC FU Call Status:: Unsuccessful Call (2nd Attempt) Unsuccessful Call (1st Attempt) Date: 01/13/23 Unsuccessful Call (2nd Attempt) Date: 01/15/23  Attempted to reach the patient regarding the most recent Inpatient/ED visit.  Follow Up Plan: Additional outreach attempts will be made to reach the patient to complete the Transitions of Care (Post Inpatient/ED visit) call.   Signature Karena Addison, LPN [facility] Nurse Health Advisor Direct Dial [phone]

## 2023-01-20 NOTE — Transitions of Care (Post Inpatient/ED Visit) (Signed)
   01/20/2023  Name: Gabriela Smith MRN: [id number] DOB: 24-Oct-1940  Today's TOC FU Call Status: Today's TOC FU Call Status:: Unsuccessful Call (3rd Attempt) Unsuccessful Call (1st Attempt) Date: 01/13/23 Unsuccessful Call (2nd Attempt) Date: 01/15/23 Unsuccessful Call (3rd Attempt) Date: 01/20/23  Attempted to reach the patient regarding the most recent Inpatient/ED visit.  Follow Up Plan: No further outreach attempts will be made at this time. We have been unable to contact the patient.  Signature Karena Addison, LPN [facility] Nurse Health Advisor Direct Dial [phone]

## 2023-01-21 ENCOUNTER — Other Ambulatory Visit: Payer: Self-pay

## 2023-01-21 DIAGNOSIS — I739 Peripheral vascular disease, unspecified: Secondary | ICD-10-CM

## 2023-01-29 ENCOUNTER — Ambulatory Visit: Payer: Medicare Other | Admitting: Physician Assistant

## 2023-01-29 VITALS — BP 134/68 | HR 97 | Resp 18 | Ht 63.0 in | Wt 177.0 lb

## 2023-01-29 DIAGNOSIS — R413 Other amnesia: Secondary | ICD-10-CM | POA: Diagnosis not present

## 2023-01-29 MED ORDER — DONEPEZIL HCL 10 MG PO TABS: 10.0000 mg | ORAL_TABLET | Freq: Every day | ORAL | 11 refills | Status: DC

## 2023-01-29 NOTE — Patient Instructions (Addendum)
It was a pleasure to see you today at our office.   Recommendations:  Neurocognitive evaluation at our office    Increase the donepezil to 10 mg daily   6 months      For psychiatric meds, mood meds: Please have your primary care physician manage these medications.  If you have any severe symptoms of a stroke, or other severe issues such as confusion,severe chills or fever, etc call 911 or go to the ER as you may need to be evaluated further     For assessment of decision of mental capacity and competency:  Call Dr. Erick Blinks, geriatric psychiatrist at [phone]  Counseling regarding caregiver distress, including caregiver depression, anxiety and issues regarding community resources, adult day care programs, adult living facilities, or memory care questions:  please contact your  Primary Doctor's Social Worker   Whom to call: Memory  decline, memory medications: Call our office [phone]    [email or web address]   RECOMMENDATIONS FOR ALL PATIENTS WITH MEMORY PROBLEMS: 1. Continue to exercise (Recommend 30 minutes of walking everyday, or 3 hours every week) 2. Increase social interactions - continue going to [place] and enjoy social gatherings with friends and family 3. Eat healthy, avoid fried foods and eat more fruits and vegetables 4. Maintain adequate blood pressure, blood sugar, and blood cholesterol level. Reducing the risk of stroke and cardiovascular disease also helps promoting better memory. 5. Avoid stressful situations. Live a simple life and avoid aggravations. Organize your time and prepare for the next day in anticipation. 6. Sleep well, avoid any interruptions of sleep and avoid any distractions in the bedroom that may interfere with adequate sleep quality 7. Avoid sugar, avoid sweets as there is a strong link between excessive sugar intake, diabetes, and cognitive impairment We discussed the Mediterranean  diet, which has been shown to help patients reduce the risk of progressive memory disorders and reduces cardiovascular risk. This includes eating fish, eat fruits and green leafy vegetables, nuts like almonds and hazelnuts, walnuts, and also use olive oil. Avoid fast foods and fried foods as much as possible. Avoid sweets and sugar as sugar use has been linked to worsening of memory function.  There is always a concern of gradual progression of memory problems. If this is the case, then we may need to adjust level of care according to patient needs. Support, both to the patient and caregiver, should then be put into place.      You have been referred for a neuropsychological evaluation (i.e., evaluation of memory and thinking abilities). Please bring someone with you to this appointment if possible, as it is helpful for the doctor to hear from both you and another adult who knows you well. Please bring eyeglasses and hearing aids if you wear them.    The evaluation will take approximately 3 hours and has two parts:   The first part is a clinical interview with the neuropsychologist (Dr. Milbert Coulter or Dr. Roseanne Reno). During the interview, the neuropsychologist will speak with you and the individual you brought to the appointment.    The second part of the evaluation is testing with the doctor's technician Annabelle Harman or Selena Batten). During the testing, the technician will ask you to remember different types of material, solve problems, and answer some questionnaires. Your family member will not be present for this portion of the evaluation.   Please note: We must reserve several hours of the neuropsychologist's time and the psychometrician's time for your evaluation appointment. As such, there is  a No-Show fee of $100. If you are unable to attend any of your appointments, please contact our office as soon as possible to reschedule.      DRIVING: Regarding driving, in patients with progressive memory problems, driving  will be impaired. We advise to have someone else do the driving if trouble finding directions or if minor accidents are reported. Independent driving assessment is available to determine safety of driving.   If you are interested in the driving assessment, you can contact the following:  [facility] [employer] [facility] in [place] [phone]  [facility] [phone]  [facility] [phone]  [facility] [phone] or [phone]   FALL PRECAUTIONS: Be cautious when walking. Scan the area for obstacles that may increase the risk of trips and falls. When getting up in the mornings, sit up at the edge of the bed for a few minutes before getting out of bed. Consider elevating the bed at the head end to avoid drop of blood pressure when getting up. Walk always in a well-lit room (use night lights in the walls). Avoid area rugs or power cords from appliances in the middle of the walkways. Use a walker or a cane if necessary and consider physical therapy for balance exercise. Get your eyesight checked regularly.  FINANCIAL OVERSIGHT: Supervision, especially oversight when making financial decisions or transactions is also recommended.  HOME SAFETY: Consider the safety of the kitchen when operating appliances like stoves, microwave oven, and blender. Consider having supervision and share cooking responsibilities until no longer able to participate in those. Accidents with firearms and other hazards in the house should be identified and addressed as well.   ABILITY TO BE LEFT ALONE: If patient is unable to contact 911 operator, consider using LifeLine, or when the need is there, arrange for someone to stay with patients. Smoking is a fire hazard, consider supervision or cessation. Risk of wandering should be assessed by caregiver and if detected at any point, supervision and safe proof recommendations should be instituted.  MEDICATION SUPERVISION: Inability to  self-administer medication needs to be constantly addressed. Implement a mechanism to ensure safe administration of the medications.      Mediterranean Diet A Mediterranean diet refers to food and lifestyle choices that are based on the traditions of countries located on the [employer] [place]. This way of eating has been shown to help prevent certain conditions and improve outcomes for people who have chronic diseases, like kidney disease and heart disease. What are tips for following this plan? Lifestyle  Cook and eat meals together with your family, when possible. Drink enough fluid to keep your urine clear or pale yellow. Be physically active every day. This includes: Aerobic exercise like running or swimming. Leisure activities like gardening, walking, or housework. Get 7-8 hours of sleep each night. If recommended by your health care provider, drink red wine in moderation. This means 1 glass a day for nonpregnant women and 2 glasses a day for men. A glass of wine equals 5 oz (150 mL). Reading food labels  Check the serving size of packaged foods. For foods such as rice and pasta, the serving size refers to the amount of cooked product, not dry. Check the total fat in packaged foods. Avoid foods that have saturated fat or trans fats. Check the ingredients list for added sugars, such as corn syrup. Shopping  At the grocery store, buy most of your food from the areas near the walls of the store. This includes: Fresh fruits  and vegetables (produce). Grains, beans, nuts, and seeds. Some of these may be available in unpackaged forms or large amounts (in bulk). Fresh seafood. Poultry and eggs. Low-fat dairy products. Buy whole ingredients instead of prepackaged foods. Buy fresh fruits and vegetables in-season from local farmers markets. Buy frozen fruits and vegetables in resealable bags. If you do not have access to quality fresh seafood, buy precooked frozen shrimp or canned fish, such  as tuna, salmon, or sardines. Buy small amounts of raw or cooked vegetables, salads, or olives from the deli or salad bar at your store. Stock your pantry so you always have certain foods on hand, such as olive oil, canned tuna, canned tomatoes, rice, pasta, and beans. Cooking  Cook foods with extra-virgin olive oil instead of using butter or other vegetable oils. Have meat as a side dish, and have vegetables or grains as your main dish. This means having meat in small portions or adding small amounts of meat to foods like pasta or stew. Use beans or vegetables instead of meat in common dishes like chili or lasagna. Experiment with different cooking methods. Try roasting or broiling vegetables instead of steaming or sauteing them. Add frozen vegetables to soups, stews, pasta, or rice. Add nuts or seeds for added healthy fat at each meal. You can add these to yogurt, salads, or vegetable dishes. Marinate fish or vegetables using olive oil, lemon juice, garlic, and fresh herbs. Meal planning  Plan to eat 1 vegetarian meal one day each week. Try to work up to 2 vegetarian meals, if possible. Eat seafood 2 or more times a week. Have healthy snacks readily available, such as: Vegetable sticks with hummus. Greek yogurt. Fruit and nut trail mix. Eat balanced meals throughout the week. This includes: Fruit: 2-3 servings a day Vegetables: 4-5 servings a day Low-fat dairy: 2 servings a day Fish, poultry, or lean meat: 1 serving a day Beans and legumes: 2 or more servings a week Nuts and seeds: 1-2 servings a day Whole grains: 6-8 servings a day Extra-virgin olive oil: 3-4 servings a day Limit red meat and sweets to only a few servings a month What are my food choices? Mediterranean diet Recommended Grains: Whole-grain pasta. Brown rice. Bulgar wheat. Polenta. Couscous. Whole-wheat bread. Orpah Cobb. Vegetables: Artichokes. Beets. Broccoli. Cabbage. Carrots. Eggplant. Green beans. Chard.  Kale. Spinach. Onions. Leeks. Peas. Squash. Tomatoes. Peppers. Radishes. Fruits: Apples. Apricots. Avocado. Berries. Bananas. Cherries. Dates. Figs. Grapes. Lemons. Melon. Oranges. Peaches. Plums. Pomegranate. Meats and other protein foods: Beans. Almonds. Sunflower seeds. Pine nuts. Peanuts. Cod. Salmon. Scallops. Shrimp. Tuna. Tilapia. Clams. Oysters. Eggs. Dairy: Low-fat milk. Cheese. Greek yogurt. Beverages: Water. Red wine. Herbal tea. Fats and oils: Extra virgin olive oil. Avocado oil. Grape seed oil. Sweets and desserts: [place] yogurt with honey. Baked apples. Poached pears. Trail mix. Seasoning and other foods: Basil. Cilantro. Coriander. Cumin. Mint. Parsley. Sage. Rosemary. Tarragon. Garlic. Oregano. Thyme. Pepper. Balsalmic vinegar. Tahini. Hummus. Tomato sauce. Olives. Mushrooms. Limit these Grains: Prepackaged pasta or rice dishes. Prepackaged cereal with added sugar. Vegetables: Deep fried potatoes (french fries). Fruits: Fruit canned in syrup. Meats and other protein foods: Beef. Pork. Lamb. Poultry with skin. Hot dogs. Tomasa Blase. Dairy: Ice cream. Sour cream. Whole milk. Beverages: Juice. Sugar-sweetened soft drinks. Beer. Liquor and spirits. Fats and oils: Butter. Canola oil. Vegetable oil. Beef fat (tallow). Lard. Sweets and desserts: Cookies. Cakes. Pies. Candy. Seasoning and other foods: Mayonnaise. Premade sauces and marinades. The items listed may not be a complete list. Talk  with your dietitian about what dietary choices are right for you. Summary The Mediterranean diet includes both food and lifestyle choices. Eat a variety of fresh fruits and vegetables, beans, nuts, seeds, and whole grains. Limit the amount of red meat and sweets that you eat. Talk with your health care provider about whether it is safe for you to drink red wine in moderation. This means 1 glass a day for nonpregnant women and 2 glasses a day for men. A glass of wine equals 5 oz (150 mL). This information  is not intended to replace advice given to you by your health care provider. Make sure you discuss any questions you have with your health care provider. Document Released: 10/18/2015 Document Revised: 11/20/2015 Document Reviewed: 10/18/2015 Elsevier Interactive Patient Education  2017 [employer].

## 2023-01-29 NOTE — Progress Notes (Signed)
Assessment/Plan:   Memory impairment, concern for vascular etiology  Gabriela Smith is a very pleasant 82 y.o. RH female  with a history of hypertension, hyperlipidemia, hypothyroidism, CAD status post CABG, AAA, chronic atrial fibrillation on apixaban, history of aortic stenosis, recent acute respiratory failure with hypoxia secondary to pulmonary edema on October 2024, seen today in follow up to discuss the MRI of the brain results performed on 01/2023. These were personally reviewed, remarkable for moderate to severe chronic SVD within the cerebral white matter, no evidence of lobar predominant atrophy. An incidental 1 cm within the R cerebellum noted, of no apparent significance (asymptomatic). No acute findings.  Patient is currently on donepezil 5 mg daily, tolerating well. She was last seen on 12/23/22 with a MoCA of 20/30.  This patient is accompanied in the office by her daughter who supplements the history.  Previous records as well as any outside records available were reviewed prior to todays visit.      Follow up in 6  months after Neuropsych testing, scheduled for 07/2023 Increase donepezil to 10 mg daily for better coverage given the findings. Side effects discussed   Continue to control mood as per PCP Recommend good control of cardiovascular risk factors   Initial visit 12/23/22 How long did patient have memory difficulties?  For about 4 years, worse over the last 6 months since her husband's death.  Patient reports some difficulty remembering new information, recent conversations, names. Needs to write a note for reminder, and has to write appointments in calendar. Likes to do crossword puzzles and  word finding. repeats oneself? Endorsed "to get the answers straight in my head". Disoriented when walking into a room?  Patient denies    Leaving objects in unusual places?  Denies.   Wandering behavior? denies   Any personality changes, or depression, anxiety? She is dealing  with situational depression after the recent death of her husband of 65 years. "I am doing better, she says"   Hallucinations or paranoia? denies   Seizures? denies    Any sleep changes?  Sleeps well , denies dreams, REM behavior or sleepwalking   Sleep apnea? Denies.   Any hygiene concerns?  Denies.   Independent of bathing and dressing? Endorsed  Does the patient need help with medications? Daughter  is in charge   Who is in charge of the finances? Daughter  is in charge     Any changes in appetite?   Denies.     Patient have trouble swallowing?  Denies.   Does the patient cook?" I am used to cooking for more people, it is hard to just cook for 1 ".  Any headaches?  Denies.   Chronic pain? Denies.   Ambulates with difficulty? Denies.   Recent falls or head injuries? Denies.     Vision changes?  Denies any new issues.  Has a history of cataracts  Any strokelike symptoms? Denies.   Any tremors? Denies.   Any anosmia? Denies.   Any incontinence of urine? Urge "due to fluid pill" Any bowel dysfunction? Denies.      Patient lives with daughter in a garage apartment  History of heavy alcohol intake? Denies.   History of heavy tobacco use? Denies.   Family history of dementia?  Her sister and father had AD dementia  Does patient drive?Yes, only locally, denies getting lost   CURRENT MEDICATIONS:  Outpatient Encounter Medications as of 01/29/2023  Medication Sig   acetaminophen (TYLENOL) 500 MG tablet  Take 2 tablets (1,000 mg total) by mouth every 8 (eight) hours. (Patient taking differently: Take 1,000 mg by mouth every 6 (six) hours as needed for moderate pain (pain score 4-6).)   amLODipine (NORVASC) 5 MG tablet Take 1 tablet (5 mg total) by mouth daily.   apixaban (ELIQUIS) 5 MG TABS tablet Take 1 tablet (5 mg total) by mouth 2 (two) times daily.   aspirin EC 81 MG EC tablet Take 1 tablet (81 mg total) by mouth daily.   cholecalciferol (VITAMIN D) 1000 UNITS tablet Take 1,000 Units  by mouth daily after breakfast.    Co-Enzyme Q10 200 MG CAPS Take 200 mg by mouth daily after breakfast.    digoxin (LANOXIN) 0.125 MG tablet TAKE ONE TABLET BY MOUTH DAILY   donepezil (ARICEPT) 5 MG tablet Take 1 tablet (5 mg total) by mouth daily.   Evolocumab (REPATHA SURECLICK) 140 MG/ML SOAJ INJECT 140mg  (1ml) EVERY TWO WEEKS AS DIRECTED   Flaxseed, Linseed, (FLAX SEED OIL) 1000 MG CAPS Take 1,000 mg by mouth daily after breakfast.    furosemide (LASIX) 40 MG tablet Take 1 tablet (40 mg total) by mouth daily.   GNP VITAMIN B-12 500 MCG tablet TAKE 1 TABLET DAILY   hydrocortisone 1 % lotion Apply 1 application. topically 2 (two) times daily. (Patient taking differently: Apply 1 application  topically 2 (two) times daily as needed for itching (Dermatitis).)   levothyroxine (SYNTHROID) 50 MCG tablet Take 1 tablet (50 mcg total) by mouth daily.   Magnesium 300 MG CAPS Take 300 mg by mouth at bedtime.    metoprolol tartrate (LOPRESSOR) 100 MG tablet Take 1 tablet (100 mg total) by mouth 2 (two) times daily. **NEEDS TO BE SEEN BEFORE NEXT REFILL**   nitroGLYCERIN (NITROSTAT) 0.4 MG SL tablet PLACE 1 TABLET UNDER THE TONGUE AT ONSET OF CHEST PAIN EVERY 5 MINTUES UP TO 3 TIMES AS NEEDED   potassium chloride SA (KLOR-CON M) 20 MEQ tablet Take 1 tablet (20 mEq total) by mouth daily.   TURMERIC PO Take 1 capsule by mouth daily after breakfast.   Facility-Administered Encounter Medications as of 01/29/2023  Medication   DOBUTamine (DOBUTREX) 1,000 mcg/mL in dextrose 5% 250 mL infusion       08/25/2022   11:52 AM 12/01/2017    8:17 AM  MMSE - Mini Mental State Exam  Orientation to time 4 5  Orientation to Place 5 5  Registration 2 3  Attention/ Calculation 5 5  Recall 3 3  Language- name 2 objects 2 2  Language- repeat 1 1  Language- follow 3 step command 3 3  Language- read & follow direction 1 1  Write a sentence 1 1  Copy design 1 1  Total score 28 30      12/23/2022   12:00 PM   Montreal Cognitive Assessment   Visuospatial/ Executive (0/5) 2  Naming (0/3) 3  Attention: Read list of digits (0/2) 2  Attention: Read list of letters (0/1) 1  Attention: Serial 7 subtraction starting at 100 (0/3) 2  Language: Repeat phrase (0/2) 1  Language : Fluency (0/1) 1  Abstraction (0/2) 1  Delayed Recall (0/5) 0  Orientation (0/6) 6  Total 19  Adjusted Score (based on education) 20   Thank you for allowing [place] the opportunity to participate in the care of this nice patient. Please do not hesitate to contact us for any questions or concerns.   Total time spent on today's visit was 25 minutes  dedicated to this patient today, preparing to see patient, examining the patient, ordering tests and/or medications and counseling the patient, documenting clinical information in the EHR or other health record, independently interpreting results and communicating results to the patient/family, discussing treatment and goals, answering patient's questions and coordinating care.  Cc:  Raliegh Ip, DO  Marlowe Kays 01/29/2023 6:07 AM

## 2023-02-13 ENCOUNTER — Other Ambulatory Visit: Payer: Self-pay | Admitting: Cardiology

## 2023-02-13 ENCOUNTER — Other Ambulatory Visit: Payer: Self-pay | Admitting: Family Medicine

## 2023-02-13 DIAGNOSIS — I480 Paroxysmal atrial fibrillation: Secondary | ICD-10-CM

## 2023-03-05 ENCOUNTER — Ambulatory Visit: Payer: Medicare Other

## 2023-03-05 ENCOUNTER — Telehealth: Payer: Self-pay | Admitting: Family Medicine

## 2023-03-05 NOTE — Telephone Encounter (Signed)
Copied from CRM [id number]. Topic: General - Other >> Mar 05, 2023 12:03 PM Geneva B wrote: Reason for CRM: pt called in and said that she thinks that she may have a bladder infection but she wants to speak directly to the nurse she asked if thr nurse can give her a call back [phone] 

## 2023-03-05 NOTE — Telephone Encounter (Signed)
Pt scheduled tomorrow with DOD at 2:35 and pt's daughter is aware.

## 2023-03-06 ENCOUNTER — Ambulatory Visit (INDEPENDENT_AMBULATORY_CARE_PROVIDER_SITE_OTHER): Payer: Medicare Other | Admitting: Family Medicine

## 2023-03-06 ENCOUNTER — Encounter: Payer: Self-pay | Admitting: Family Medicine

## 2023-03-06 VITALS — BP 163/72 | HR 65 | Temp 98.5°F | Ht 63.0 in | Wt 178.0 lb

## 2023-03-06 DIAGNOSIS — M79671 Pain in right foot: Secondary | ICD-10-CM | POA: Diagnosis not present

## 2023-03-06 DIAGNOSIS — M79672 Pain in left foot: Secondary | ICD-10-CM | POA: Diagnosis not present

## 2023-03-06 DIAGNOSIS — R21 Rash and other nonspecific skin eruption: Secondary | ICD-10-CM | POA: Diagnosis not present

## 2023-03-06 DIAGNOSIS — R350 Frequency of micturition: Secondary | ICD-10-CM

## 2023-03-06 DIAGNOSIS — R300 Dysuria: Secondary | ICD-10-CM | POA: Diagnosis not present

## 2023-03-06 LAB — MICROSCOPIC EXAMINATION
Renal Epithel, UA: NONE SEEN /[HPF]
Yeast, UA: NONE SEEN

## 2023-03-06 LAB — URINALYSIS, ROUTINE W REFLEX MICROSCOPIC
Bilirubin, UA: NEGATIVE
Glucose, UA: NEGATIVE
Ketones, UA: NEGATIVE
Nitrite, UA: NEGATIVE
Protein,UA: NEGATIVE
RBC, UA: NEGATIVE
Specific Gravity, UA: 1.015 (ref 1.005–1.030)
Urobilinogen, Ur: 0.2 mg/dL (ref 0.2–1.0)
pH, UA: 7 (ref 5.0–7.5)

## 2023-03-06 MED ORDER — TRIAMCINOLONE ACETONIDE 0.025 % EX OINT
1.0000 | TOPICAL_OINTMENT | Freq: Two times a day (BID) | CUTANEOUS | 0 refills | Status: AC
Start: 2023-03-06 — End: ?

## 2023-03-06 NOTE — Progress Notes (Signed)
Subjective:  Patient ID: Gabriela Smith, female    DOB: [birth year], 82 y.o.   MRN: [id number]  Patient Care Team: Raliegh Ip, DO as PCP - General (Family Medicine) Rollene Rotunda, MD as PCP - Cardiology (Cardiology) Ihor Gully, MD (Inactive) as Consulting Physician (Urology) Antony Contras, MD as Consulting Physician (Ophthalmology) Corliss Skains, MD as Consulting Physician (Cardiothoracic Surgery)   Chief Complaint:  Dysuria (Neuropathy/rash)  HPI: Gabriela Smith is a 82 y.o. female presenting on 03/06/2023 for Dysuria (Neuropathy/rash)   HPI 1. Increased urinary frequency  States that she has increased frequency that started a few days ago. States that she has a history of bladder cancer. Denies any burning, change in color, odor, itching, back pain, fever, or discharge. Getting up only at night once to go. Going about 1 time per hour during the day and having constant feeling of having to go.   2. Rash  States that it started a couple of weeks ago. Believes that it is due to stress over losing her husband one year ago at Christmas. States that breaks out. Does not burn and is not painful. States that it is on her wrists and back. Has tried creams on her rash but it has not helped   3. Feet pain  Does not know if they are more swollen that normal. States that they are very painful. States that it started a few months ago. Typically wears compression stockings.    Relevant past medical, surgical, family, and social history reviewed and updated as indicated.  Allergies and medications reviewed and updated. Data reviewed: Chart in Epic.   Past Medical History:  Diagnosis Date   AAA (abdominal aortic aneurysm) [facility]    cardiology aware    Arthritis    OA lt knee- cortizone inj. q 4 months AND OA ALSO IN BACK. Had knee replaced-issue resolved   CAD (coronary artery disease) CARDIOLOGIST- DR [facility]--  VISIT 01-02-10 IN EPIC   1993-- PTCA. Pt describes a near  total blockage of apparently the LAD and a 65% stenosis elsewhere.   Cancer [facility])    History of bladder cancer followed by dr Vernie Ammons   hx  TCC of bladder ,  Ta G3-  first occurence 02-20-2012--  s/p BCG tx's  and  mitomycin C   Hyperlipidemia 1993   Hypertension 1993   Nocturia    Osteopenia    Sigmoid diverticulosis     Past Surgical History:  Procedure Laterality Date   CHOLECYSTECTOMY  2003 (approx)   CLIPPING OF ATRIAL APPENDAGE N/A 04/25/2019   Procedure: Clipping Of Atrial Appendage;  Surgeon: Corliss Skains, MD;  Location: [facility] OR;  Service: Open Heart Surgery;  Laterality: N/A;   COLONOSCOPY  05-31-2003   CORONARY ANGIOPLASTY  1993   to LAD   CORONARY ARTERY BYPASS GRAFT N/A 04/25/2019   Procedure: CORONARY ARTERY BYPASS GRAFTING (CABG) times four using left internal mammary artery and left greater saphenous vein harvested endoscipically.;  Surgeon: Corliss Skains, MD;  Location: [facility] OR;  Service: Open Heart Surgery;  Laterality: N/A;   CYSTOSCOPY WITH BIOPSY  05/09/2011   Procedure: CYSTOSCOPY WITH BIOPSY;  Surgeon: Garnett Farm, MD;  Location: [facility];  Service: Urology;  Laterality: N/A;  WITH BLADDER BIOPSY GYRUS   LEFT HEART CATH AND CORONARY ANGIOGRAPHY N/A 04/19/2019   Procedure: LEFT HEART CATH AND CORONARY ANGIOGRAPHY;  Surgeon: Marykay Lex, MD;  Location: [facility] INVASIVE CV LAB;  Service:  Cardiovascular;  Laterality: N/A;   TOTAL HIP ARTHROPLASTY Right 12/25/2015   Procedure: RIGHT TOTAL HIP ARTHROPLASTY ANTERIOR APPROACH;  Surgeon: Durene Romans, MD;  Location: [facility] ORS;  Service: Orthopedics;  Laterality: Right;   TOTAL KNEE ARTHROPLASTY Left 11/22/2012   Procedure: LEFT TOTAL KNEE ARTHROPLASTY;  Surgeon: Shelda Pal, MD;  Location: [facility] ORS;  Service: Orthopedics;  Laterality: Left;   TRANSURETHRAL RESECTION OF BLADDER TUMOR  03/14/2011   Procedure: TRANSURETHRAL RESECTION OF BLADDER TUMOR (TURBT);  Surgeon: Garnett Farm, MD;  Location:  [facility];  Service: Urology;  Laterality: N/A;   TRANSURETHRAL RESECTION OF BLADDER TUMOR  05/09/2011   Procedure: TRANSURETHRAL RESECTION OF BLADDER TUMOR (TURBT);  Surgeon: Garnett Farm, MD;  Location: [facility];  Service: Urology;  Laterality: N/A;   TRANSURETHRAL RESECTION OF BLADDER TUMOR  03/08/2012   Procedure: TRANSURETHRAL RESECTION OF BLADDER TUMOR (TURBT);  Surgeon: Garnett Farm, MD;  Location: [facility];  Service: Urology;  Laterality: N/A;   TRANSURETHRAL RESECTION OF BLADDER TUMOR WITH GYRUS (TURBT-GYRUS) N/A 03/24/2014   Procedure: TRANSURETHRAL RESECTION OF BLADDER TUMOR WITH GYRUS (TURBT-GYRUS);  Surgeon: Garnett Farm, MD;  Location: [facility];  Service: Urology;  Laterality: N/A;   UMBILICAL HERNIA REPAIR  2001  (approx)    Social History   Socioeconomic History   Marital status: Widowed    Spouse name: Fayrene Fearing   Number of children: 3   Years of education: 12   Highest education level: High school graduate  Occupational History   Occupation: retired  Tobacco Use   Smoking status: Former    Current packs/day: 0.00    Average packs/day: 0.5 packs/day for 5.0 years (2.5 ttl pk-yrs)    Types: Cigarettes    Start date: 03/10/1971    Quit date: 03/09/1976    Years since quitting: 47.0   Smokeless tobacco: Never  Vaping Use   Vaping status: Never Used  Substance and Sexual Activity   Alcohol use: Yes    Alcohol/week: 4.0 standard drinks of alcohol    Types: 4 Glasses of wine per week    Comment: red wine   Drug use: No   Sexual activity: Not Currently  Other Topics Concern   Not on file  Social History Narrative   Married with 3 children and 4 grandchildren.  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 with Parkinsons   Lives in garage apartment at her daughters home.   Right handed   Drinks caffeine prn   retired   Chief Executive Officer Drivers of [employer]: Low Risk  (02/04/2022)   Overall  Financial Resource Strain (CARDIA)    Difficulty of Paying Living Expenses: Not hard at all  Food Insecurity: No Food Insecurity (12/15/2022)   Hunger Vital Sign    Worried About Running Out of Food in the Last Year: Never true    Ran Out of Food in the Last Year: Never true  Transportation Needs: No Transportation Needs (12/15/2022)   PRAPARE - Administrator, Civil Service (Medical): No    Lack of Transportation (Non-Medical): No  Physical Activity: Insufficiently Active (02/04/2022)   Exercise Vital Sign    Days of Exercise per Week: 3 days    Minutes of Exercise per Session: 30 min  Stress: No Stress Concern Present (02/04/2022)   [employer] of Occupational Health - Occupational Stress Questionnaire    Feeling of Stress : Not at all  Social Connections: Moderately Integrated (  02/04/2022)   Social Connection and Isolation Panel [NHANES]    Frequency of Communication with Friends and Family: More than three times a week    Frequency of Social Gatherings with Friends and Family: More than three times a week    Attends Religious Services: 1 to 4 times per year    Active Member of [employer] or Organizations: No    Attends Banker Meetings: Never    Marital Status: Married  Catering manager Violence: Not At Risk (12/12/2022)   Humiliation, Afraid, Rape, and Kick questionnaire    Fear of Current or Ex-Partner: No    Emotionally Abused: No    Physically Abused: No    Sexually Abused: No    Outpatient Encounter Medications as of 03/06/2023  Medication Sig   acetaminophen (TYLENOL) 500 MG tablet Take 2 tablets (1,000 mg total) by mouth every 8 (eight) hours. (Patient taking differently: Take 1,000 mg by mouth every 6 (six) hours as needed for moderate pain (pain score 4-6).)   amLODipine (NORVASC) 5 MG tablet Take 1 tablet (5 mg total) by mouth daily.   apixaban (ELIQUIS) 5 MG TABS tablet Take 1 tablet (5 mg total) by mouth 2 (two) times daily.   aspirin EC 81  MG EC tablet Take 1 tablet (81 mg total) by mouth daily.   cholecalciferol (VITAMIN D) 1000 UNITS tablet Take 1,000 Units by mouth daily after breakfast.    Co-Enzyme Q10 200 MG CAPS Take 200 mg by mouth daily after breakfast.    digoxin (LANOXIN) 0.125 MG tablet TAKE ONE TABLET BY MOUTH DAILY   donepezil (ARICEPT) 10 MG tablet Take 1 tablet (10 mg total) by mouth daily.   Evolocumab (REPATHA SURECLICK) 140 MG/ML SOAJ INJECT 140mg  (1ml) EVERY TWO WEEKS AS DIRECTED   Flaxseed, Linseed, (FLAX SEED OIL) 1000 MG CAPS Take 1,000 mg by mouth daily after breakfast.    furosemide (LASIX) 40 MG tablet TAKE ONE TABLET BY MOUTH EVERY DAY   GNP VITAMIN B-12 500 MCG tablet TAKE 1 TABLET DAILY   hydrocortisone 1 % lotion Apply 1 application. topically 2 (two) times daily. (Patient taking differently: Apply 1 application  topically 2 (two) times daily as needed for itching (Dermatitis).)   levothyroxine (SYNTHROID) 50 MCG tablet Take 1 tablet (50 mcg total) by mouth daily.   Magnesium 300 MG CAPS Take 300 mg by mouth at bedtime.    metoprolol tartrate (LOPRESSOR) 100 MG tablet Take 1 tablet (100 mg total) by mouth 2 (two) times daily.   nitroGLYCERIN (NITROSTAT) 0.4 MG SL tablet PLACE 1 TABLET UNDER THE TONGUE AT ONSET OF CHEST PAIN EVERY 5 MINTUES UP TO 3 TIMES AS NEEDED   potassium chloride SA (KLOR-CON M) 20 MEQ tablet Take 1 tablet (20 mEq total) by mouth daily.   TURMERIC PO Take 1 capsule by mouth daily after breakfast.   Facility-Administered Encounter Medications as of 03/06/2023  Medication   DOBUTamine (DOBUTREX) 1,000 mcg/mL in dextrose 5% 250 mL infusion    Allergies  Allergen Reactions   Fish Allergy Hives, Shortness Of Breath and Other (See Comments)    "Bottom-feeder fish"   Fish-Derived Products Hives, Shortness Of Breath and Other (See Comments)    Bottom-feeder fish   Shellfish-Derived Products Hives and Shortness Of Breath   Lasix [Furosemide] Swelling and Other (See Comments)     Edema, elevated BP   Codeine Rash   Latex Itching and Rash    Reports Elastic in underwear, under wire bras, and now  surgical cap cause rash and itching.    Macrodantin [Nitrofurantoin Macrocrystal] Other (See Comments)    Unknown reaction    Niacin Other (See Comments)    Muscle aches   Niaspan [Niacin Er (Antihyperlipidemic)] Other (See Comments)    Muscle aches   Statins Other (See Comments)    Muscle aches/ cramps   Sulfa Antibiotics Rash and Other (See Comments)    Muscle aches/ cramps, also   Vicodin [Hydrocodone-Acetaminophen] Nausea And Vomiting and Other (See Comments)    SEVERE N/V   Zetia [Ezetimibe] Other (See Comments)    Muscle aches    Review of Systems As per HPI  Objective:  BP (!) 146/101   Pulse 65   Temp 98.5 F (36.9 C)   Ht 5\' 3"  (1.6 m)   Wt 178 lb (80.7 kg)   SpO2 95%   BMI 31.53 kg/m    Wt Readings from Last 3 Encounters:  03/06/23 178 lb (80.7 kg)  01/29/23 177 lb (80.3 kg)  01/06/23 177 lb (80.3 kg)    Physical Exam Constitutional:      General: She is awake. She is not in acute distress.    Appearance: Normal appearance. She is well-developed and well-groomed. She is not ill-appearing, toxic-appearing or diaphoretic.  Cardiovascular:     Rate and Rhythm: Normal rate and regular rhythm.     Pulses: Normal pulses.          Radial pulses are 2+ on the right side and 2+ on the left side.       Posterior tibial pulses are 2+ on the right side and 2+ on the left side.     Heart sounds: Normal heart sounds. No murmur heard.    No gallop.  Pulmonary:     Effort: Pulmonary effort is normal. No respiratory distress.     Breath sounds: Normal breath sounds. No stridor. No wheezing, rhonchi or rales.  Abdominal:     General: Abdomen is flat. Bowel sounds are normal.     Palpations: Abdomen is soft.     Tenderness: There is no abdominal tenderness. There is no right CVA tenderness or left CVA tenderness.     Hernia: No hernia is present.   Musculoskeletal:     Cervical back: Full passive range of motion without pain and neck supple.     Right lower leg: No edema.     Left lower leg: No edema.  Skin:    General: Skin is warm.     Capillary Refill: Capillary refill takes less than 2 seconds.     Findings: Rash present. Rash is crusting, papular and scaling.     Comments: Erythematous, scaling, circular lesions along bilateral forearms  Neurological:     General: No focal deficit present.     Mental Status: She is alert, oriented to person, place, and time and easily aroused. Mental status is at baseline.     GCS: GCS eye subscore is 4. GCS verbal subscore is 5. GCS motor subscore is 6.     Motor: No weakness.  Psychiatric:        Attention and Perception: Attention and perception normal.        Mood and Affect: Mood and affect normal.        Speech: Speech normal.        Behavior: Behavior normal. Behavior is cooperative.        Thought Content: Thought content normal. Thought content does not include homicidal or suicidal ideation. Thought  content does not include homicidal or suicidal plan.        Cognition and Memory: Cognition and memory normal.        Judgment: Judgment normal.     Results for orders placed or performed during the hospital encounter of 01/06/23  VAS [place] ABI WITH/WO TBI   Collection Time: 01/06/23  8:32 AM  Result Value Ref Range   Right ABI 0.80    Left ABI 0.88        03/06/2023    3:07 PM 09/17/2022   11:57 AM 09/02/2022    9:34 AM 08/25/2022   11:32 AM 01/03/2022    8:21 AM  Depression screen PHQ 2/9  Decreased Interest  0 0 0 0  Down, Depressed, Hopeless 0 0 0 0 0  PHQ - 2 Score 0 0 0 0 0  Altered sleeping  0 0 0 0  Tired, decreased energy  0 0 0 0  Change in appetite  0 0 0 0  Feeling bad or failure about yourself   0 0 0 0  Trouble concentrating  0 0 0 0  Moving slowly or fidgety/restless  0 0 0 0  Suicidal thoughts  0 0 0 0  PHQ-9 Score  0 0 0 0  Difficult doing work/chores   Not difficult at all Not difficult at all Not difficult at all Not difficult at all       03/06/2023    3:07 PM 09/17/2022   11:57 AM 09/02/2022    9:34 AM 08/25/2022   11:31 AM  GAD 7 : Generalized Anxiety Score  Nervous, Anxious, on Edge 0 0 2 2  Control/stop worrying 0 0 2 2  Worry too much - different things 0 0 1 1  Trouble relaxing 0 0 0 0  Restless 0 0 0 0  Easily annoyed or irritable 0 0 0 0  Afraid - awful might happen 0 0 0 0  Total GAD 7 Score 0 0 5 5  Anxiety Difficulty Not difficult at all Not difficult at all Not difficult at all     Pertinent labs & imaging results that were available during my care of the patient were reviewed by me and considered in my medical decision making.  Assessment & Plan:  Lillyen was seen today for dysuria.  Diagnoses and all orders for this visit:  Urinary frequency Labs as below. Will communicate results to patient once available. Will await results to determine next steps.  Based on UA will wait for results of culture to determine next stepes.  -     Urinalysis, Routine w reflex microscopic -     Urine Culture  Rash Discussed mixing steroid cream and thick tub lotion (Equate Eczema, Cetaphil, Cerave, Aquaphor, or Eucerin) and then applying to affected areas. -     triamcinolone (KENALOG) 0.025 % ointment; Apply 1 Application topically 2 (two) times daily.  Bilateral foot pain Xray not available in office today. Discussed at home therapies with patient. Reviewed notes from vascular. Set up appt with PCP for patient to discuss further.    Continue all other maintenance medications.  Follow up plan: Return for appt w/ G in January .  Continue healthy lifestyle choices, including diet (rich in fruits, vegetables, and lean proteins, and low in salt and simple carbohydrates) and exercise (at least 30 minutes of moderate physical activity daily).  Written and verbal instructions provided   The above assessment and management plan was  discussed with the  patient. The patient verbalized understanding of and has agreed to the management plan. Patient is aware to call the clinic if they develop any new symptoms or if symptoms persist or worsen. Patient is aware when to return to the clinic for a follow-up visit. Patient educated on when it is appropriate to go to the emergency department.   Neale Burly, DNP-FNP [facility] [address] [place], [place] [address] [phone]

## 2023-03-06 NOTE — Patient Instructions (Signed)
Mix steroid cream and thick tub lotion (Equate Eczema, Cetaphil, Cerave, Aquaphor, or Eucerin) and then applying to affected area. Apply 2 times daily for 2 weeks. Then continue with lotion.   Take 5 mg of zyrtec at night

## 2023-03-08 LAB — URINE CULTURE

## 2023-03-31 ENCOUNTER — Ambulatory Visit: Payer: Medicare Other

## 2023-03-31 ENCOUNTER — Other Ambulatory Visit: Payer: Self-pay | Admitting: Cardiology

## 2023-04-09 DIAGNOSIS — L281 Prurigo nodularis: Secondary | ICD-10-CM | POA: Diagnosis not present

## 2023-04-09 DIAGNOSIS — C44622 Squamous cell carcinoma of skin of right upper limb, including shoulder: Secondary | ICD-10-CM | POA: Diagnosis not present

## 2023-04-09 DIAGNOSIS — L308 Other specified dermatitis: Secondary | ICD-10-CM | POA: Diagnosis not present

## 2023-05-22 ENCOUNTER — Other Ambulatory Visit: Payer: Self-pay | Admitting: Cardiology

## 2023-05-22 DIAGNOSIS — I480 Paroxysmal atrial fibrillation: Secondary | ICD-10-CM

## 2023-05-22 NOTE — Telephone Encounter (Signed)
 Eliquis 5mg  refill request received. Patient is 83 years old, weight-80.7kg, Crea-0.86 on 12/22/22, Diagnosis-Afib, and last seen by Rise Paganini on 12/22/22. Dose is appropriate based on dosing criteria. Will send in refill to requested pharmacy.

## 2023-05-25 DIAGNOSIS — Z08 Encounter for follow-up examination after completed treatment for malignant neoplasm: Secondary | ICD-10-CM | POA: Diagnosis not present

## 2023-05-25 DIAGNOSIS — L281 Prurigo nodularis: Secondary | ICD-10-CM | POA: Diagnosis not present

## 2023-05-25 DIAGNOSIS — Z85828 Personal history of other malignant neoplasm of skin: Secondary | ICD-10-CM | POA: Diagnosis not present

## 2023-06-15 ENCOUNTER — Other Ambulatory Visit: Payer: Self-pay | Admitting: Internal Medicine

## 2023-07-17 ENCOUNTER — Encounter: Payer: Self-pay | Admitting: Psychology

## 2023-07-17 NOTE — Progress Notes (Unsigned)
 NEUROPSYCHOLOGICAL EVALUATION [place]. [facility] Department of Neurology  Date of Evaluation: Jul 20, 2023  Reason for Referral:   Gabriela Smith is a 83 y.o. right-handed Caucasian female referred by Tex Filbert, PA-C, to characterize her current cognitive functioning and assist with diagnostic clarity and treatment planning in the context of subjective cognitive decline.   Assessment and Plan:   Clinical Impression(s): Gabriela Smith pattern of performance is suggestive of impairment surrounding delayed retrieval aspects of memory. Further performance variability was exhibited across recognition/consolidation aspects of memory. A relative weakness was also exhibited across semantic fluency. Performances were appropriate relative to age-matched peers across processing speed, attention/concentration, executive functioning, receptive language, phonemic fluency, confrontation naming, visuospatial abilities, and encoding (i.e., learning) aspects of memory. Functionally, Gabriela Smith denied difficulties completing instrumental activities of daily living (ADLs) independently. As such, given evidence for cognitive dysfunction described above, she meets criteria for a Mild Neurocognitive Disorder ("mild cognitive impairment") at the present time.  The cause for ongoing memory dysfunction remains uncertain. Despite being able to learn novel information quite well, retention rates after brief delays ranged from 0% to 17%. She also consistently performed poorly across verbal recognition trials. Taken together, this suggests evidence for rapid forgetting and an evolving and already fairly notable storage impairment. This pattern of memory performance can be characteristic of Alzheimer's disease and this illness being present in very early stages remains plausible. Her further weakness surrounding semantic fluency would follow typical disease progression.   Her most recent neuroimaging  suggested moderate to severe chronic microvascular disease. There remains the possibility of a vascular contribution to impairment given these findings. However, intact learning and amnestic memory performances do not align with cerebrovascular disease expectations particularly well. Continued medical monitoring will be very important moving forward. She does not display behavioral characteristics of Lewy body disease, another more rare parkinsonian presentation, or frontotemporal lobar degeneration.   Recommendations: A repeat neuropsychological evaluation in 12-18 months is recommended to assess the trajectory of future cognitive decline should it occur. This will also aid in future efforts towards improved diagnostic clarity.  Gabriela Smith has already been prescribed a medication aimed to address memory loss and concerns surrounding Alzheimer's disease (i.e., donepezil /Aricept ). She is encouraged to continue taking this medication as prescribed. It is important to highlight that this medication has been shown to slow functional decline in some individuals. There is no current treatment which can stop or reverse cognitive decline when caused by a neurodegenerative illness.   Performance across neurocognitive testing is not a strong predictor of an individual's safety operating a motor vehicle. Should her family wish to pursue a formalized driving evaluation, they could reach out to the following agencies: [employer] in [place]: [phone] [facility]: [phone] [facility]: [phone] [facility]: [phone] or [phone]  Should there be progression of current deficits over time, Gabriela Smith is unlikely to regain any independent living skills lost. Therefore, it is recommended that she remain as involved as possible in all aspects of household chores, finances, and medication management, with supervision to ensure adequate performance. She  will likely benefit from the establishment and maintenance of a routine in order to maximize her functional abilities over time.  It will be important for Gabriela Smith to have another person with her when in situations where she may need to process information, weigh the pros and cons of different options, and make decisions, in order to ensure that she fully understands and recalls  all information to be considered.  Gabriela Smith is encouraged to attend to lifestyle factors for brain health (e.g., regular physical exercise, good nutrition habits and consideration of the MIND-DASH diet, regular participation in cognitively-stimulating activities, and general stress management techniques), which are likely to have benefits for both emotional adjustment and cognition. Optimal control of vascular risk factors (including safe cardiovascular exercise and adherence to dietary recommendations) is encouraged. Continued participation in activities which provide mental stimulation and social interaction is also recommended.   Important information should be provided to Gabriela Smith in written format in all instances. This information should be placed in a highly frequented and easily visible location within her home to promote recall. External strategies such as written notes in a consistently used memory journal, visual and nonverbal auditory cues such as a calendar on the refrigerator or appointments with alarm, such as on a cell phone, can also help maximize recall.  Review of Records:   Past Medical History:  Diagnosis Date   AAA (abdominal aortic aneurysm) without rupture 08/24/2015   Acute CHF (congestive heart failure) 04/15/2019   Acute heart failure with preserved ejection fraction (HFpEF) 12/12/2022   Acute respiratory failure with hypoxia 12/12/2022   Aortic atherosclerosis 02/11/2017   Arthritis    OA lt knee- cortizone inj. q 4 months AND OA ALSO IN BACK. Had knee replaced-issue resolved   Atrophic  vaginitis 09/18/2016   CAD (coronary artery disease)    1993-- PTCA. Pt describes a near total blockage of apparently the LAD and a 65% stenosis elsewhere.   Chronic venous insufficiency 01/06/2023   Coronary atherosclerosis of native coronary artery 11/28/2008   Degenerative disc disease, lumbar 02/08/2013   Edema 07/03/2020   Essential hypertension 12/12/2022   History of bladder cancer    hx  TCC of bladder ,  Ta G3-  first occurence 02-20-2012--  s/p BCG tx's  and  mitomycin  C   Hyperlipidemia LDL goal <70 11/28/2008   Hypokalemia 12/12/2022   Intermittent claudication 02/17/2019   Malignant neoplasm of urinary bladder 11/04/2012   Nocturia    Osteoarthritis of left knee 11/04/2012   Osteopenia with high risk of fracture 04/20/2013   PAD (peripheral artery disease) 01/06/2023   Paroxysmal atrial fibrillation 08/12/2019   Peripheral neuropathy 03/07/2019   Peripheral vascular insufficiency 02/17/2019   S/P CABG x 4 04/26/2019   S/P left TKA 11/22/2012   Sigmoid diverticulosis    Status post hip replacement, right 12/25/2015   Vitamin D  deficiency 02/08/2013    Past Surgical History:  Procedure Laterality Date   CHOLECYSTECTOMY  2003 (approx)   CLIPPING OF ATRIAL APPENDAGE N/A 04/25/2019   Procedure: Clipping Of Atrial Appendage;  Surgeon: Hilarie Lovely, MD;  Location: [facility] OR;  Service: Open Heart Surgery;  Laterality: N/A;   COLONOSCOPY  05-31-2003   CORONARY ANGIOPLASTY  1993   to LAD   CORONARY ARTERY BYPASS GRAFT N/A 04/25/2019   Procedure: CORONARY ARTERY BYPASS GRAFTING (CABG) times four using left internal mammary artery and left greater saphenous vein harvested endoscipically.;  Surgeon: Hilarie Lovely, MD;  Location: [facility] OR;  Service: Open Heart Surgery;  Laterality: N/A;   CYSTOSCOPY WITH BIOPSY  05/09/2011   Procedure: CYSTOSCOPY WITH BIOPSY;  Surgeon: Alanson Alliance, MD;  Location: [facility];  Service: Urology;  Laterality: N/A;  WITH  BLADDER BIOPSY GYRUS   LEFT HEART CATH AND CORONARY ANGIOGRAPHY N/A 04/19/2019   Procedure: LEFT HEART CATH AND CORONARY ANGIOGRAPHY;  Surgeon: Randene Bustard  W, MD;  Location: [facility] INVASIVE [facility];  Service: Cardiovascular;  Laterality: N/A;   TOTAL HIP ARTHROPLASTY Right 12/25/2015   Procedure: RIGHT TOTAL HIP ARTHROPLASTY ANTERIOR APPROACH;  Surgeon: Claiborne Crew, MD;  Location: [facility];  Service: Orthopedics;  Laterality: Right;   TOTAL KNEE ARTHROPLASTY Left 11/22/2012   Procedure: LEFT TOTAL KNEE ARTHROPLASTY;  Surgeon: Bevin Bucks, MD;  Location: [facility];  Service: Orthopedics;  Laterality: Left;   TRANSURETHRAL RESECTION OF BLADDER TUMOR  03/14/2011   Procedure: TRANSURETHRAL RESECTION OF BLADDER TUMOR (TURBT);  Surgeon: Mark C Ottelin, MD;  Location: [facility];  Service: Urology;  Laterality: N/A;   TRANSURETHRAL RESECTION OF BLADDER TUMOR  05/09/2011   Procedure: TRANSURETHRAL RESECTION OF BLADDER TUMOR (TURBT);  Surgeon: Mark C Ottelin, MD;  Location: [facility];  Service: Urology;  Laterality: N/A;   TRANSURETHRAL RESECTION OF BLADDER TUMOR  03/08/2012   Procedure: TRANSURETHRAL RESECTION OF BLADDER TUMOR (TURBT);  Surgeon: Mark C Ottelin, MD;  Location: [facility];  Service: Urology;  Laterality: N/A;   TRANSURETHRAL RESECTION OF BLADDER TUMOR WITH GYRUS (TURBT-GYRUS) N/A 03/24/2014   Procedure: TRANSURETHRAL RESECTION OF BLADDER TUMOR WITH GYRUS (TURBT-GYRUS);  Surgeon: Mark C Ottelin, MD;  Location: [facility];  Service: Urology;  Laterality: N/A;   UMBILICAL HERNIA REPAIR  2001  (approx)    Current Outpatient Medications:    acetaminophen  (TYLENOL ) 500 MG tablet, Take 2 tablets (1,000 mg total) by mouth every 8 (eight) hours. (Patient taking differently: Take 1,000 mg by mouth every 6 (six) hours as needed for moderate pain (pain score 4-6).), Disp: 30 tablet, Rfl: 0   amLODipine  (NORVASC ) 5 MG tablet, Take 1 tablet (5 mg  total) by mouth daily., Disp: 90 tablet, Rfl: 3   apixaban  (ELIQUIS ) 5 MG TABS tablet, Take 1 tablet (5 mg total) by mouth 2 (two) times daily., Disp: 180 tablet, Rfl: 1   aspirin  EC 81 MG EC tablet, Take 1 tablet (81 mg total) by mouth daily., Disp:  , Rfl:    cholecalciferol  (VITAMIN D ) 1000 UNITS tablet, Take 1,000 Units by mouth daily after breakfast. , Disp: , Rfl:    Co-Enzyme Q10 200 MG CAPS, Take 200 mg by mouth daily after breakfast. , Disp: , Rfl:    digoxin  (LANOXIN ) 0.125 MG tablet, TAKE ONE TABLET BY MOUTH DAILY, Disp: 90 tablet, Rfl: 3   donepezil  (ARICEPT ) 10 MG tablet, Take 1 tablet (10 mg total) by mouth daily., Disp: 30 tablet, Rfl: 11   Flaxseed, Linseed, (FLAX SEED OIL) 1000 MG CAPS, Take 1,000 mg by mouth daily after breakfast. , Disp: , Rfl:    furosemide  (LASIX ) 40 MG tablet, TAKE ONE TABLET BY MOUTH EVERY DAY, Disp: 90 tablet, Rfl: 2   GNP VITAMIN B-12 500 MCG tablet, TAKE 1 TABLET DAILY, Disp: 90 tablet, Rfl: 0   hydrocortisone  1 % lotion, Apply 1 application. topically 2 (two) times daily. (Patient taking differently: Apply 1 application  topically 2 (two) times daily as needed for itching (Dermatitis).), Disp: 118 mL, Rfl: 0   levothyroxine  (SYNTHROID ) 50 MCG tablet, Take 1 tablet (50 mcg total) by mouth daily., Disp: 100 tablet, Rfl: 3   Magnesium  300 MG CAPS, Take 300 mg by mouth at bedtime. , Disp: , Rfl:    metoprolol  tartrate (LOPRESSOR ) 100 MG tablet, Take 1 tablet (100 mg total) by mouth 2 (two) times daily., Disp: 180 tablet, Rfl: 3   nitroGLYCERIN  (NITROSTAT ) 0.4 MG SL tablet,  PLACE 1 TABLET UNDER THE TONGUE AT ONSET OF CHEST PAIN EVERY 5 MINTUES UP TO 3 TIMES AS NEEDED, Disp: 25 tablet, Rfl: 0   potassium chloride  SA (KLOR-CON  M) 20 MEQ tablet, Take 1 tablet (20 mEq total) by mouth daily., Disp: 30 tablet, Rfl: 1   REPATHA  SURECLICK 140 MG/ML SOAJ, INJECT 140mg  (1ml) EVERY TWO WEEKS AS DIRECTED, Disp: 2 mL, Rfl: 11   triamcinolone  (KENALOG ) 0.025 % ointment,  Apply 1 Application topically 2 (two) times daily., Disp: 30 g, Rfl: 0   TURMERIC PO, Take 1 capsule by mouth daily after breakfast., Disp: , Rfl:  No current facility-administered medications for this visit.  Facility-Administered Medications Ordered in Other Visits:    DOBUTamine  (DOBUTREX ) 1,000 mcg/mL in dextrose  5% 250 mL infusion, 20 mcg/kg/min, Intravenous, Continuous, Liza Riggers, MD, Last Rate: 96.4 mL/hr at 12/24/15 0840, 20 mcg/kg/min at 12/24/15 0840     08/25/2022   11:52 AM 12/01/2017    8:17 AM  MMSE - Mini Mental State Exam  Orientation to time 4 5  Orientation to Place 5 5  Registration 2 3  Attention/ Calculation 5 5  Recall 3 3  Language- name 2 objects 2 2  Language- repeat 1 1  Language- follow 3 step command 3 3  Language- read & follow direction 1 1  Write a sentence 1 1  Copy design 1 1  Total score 28 30      12/23/2022   12:00 PM  [facility]real Cognitive Assessment   Visuospatial/ Executive (0/5) 2  Naming (0/3) 3  Attention: Read list of digits (0/2) 2  Attention: Read list of letters (0/1) 1  Attention: Serial 7 subtraction starting at 100 (0/3) 2  Language: Repeat phrase (0/2) 1  Language : Fluency (0/1) 1  Abstraction (0/2) 1  Delayed Recall (0/5) 0  Orientation (0/6) 6  Total 19  Adjusted Score (based on education) 20   Neuroimaging: Brain MRI on 01/02/2023 revealed moderate to severe chronic microvascular disease and no evidence of lobar predominant atrophy. 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  Clinical Interview:   The following information was obtained during a clinical interview with Ms. Westerman prior to cognitive testing.  Cognitive Symptoms: Decreased short-term memory: Denied. However, when meeting with her neurologist Abraham Hoffmann Beaver, PA-C) this past October, Ms. Stegemann reported difficulties recalling information such as names or details of recent  conversations. She noted needing to rely on written notes more heavily.  Decreased long-term memory: Denied. Decreased attention/concentration: Denied. Reduced processing speed: Denied. Difficulties with executive functions: Denied. Difficulties with emotion regulation: Denied. She also denied any prominent personality changes.  Difficulties with receptive language: Denied. Difficulties with word finding: Denied. Decreased visuoperceptual ability: Denied.  Trajectory of deficits: Ms. Brydon denied all cognitive concerns presently. When meeting with Ms. Wertman in October 2024, she reported memory decline over the previous four years. This had been exacerbated for the prior six months following the passing of her husband.   Difficulties completing ADLs: Denied. After the passing of her husband, she reported moving in with her daughter and son-in-law, residing in their garage apartment. She reported independence with medication management. She denied having any current bills given her living arrangement. She has not driven for an extended period of time, stating that she no longer has any need or occasion to drive. She denied any prior safety or navigational concerns. She was planning to gift her car to  a family member in the near future.   Additional Medical History: History of traumatic brain injury/concussion: Denied. History of stroke: Denied. History of seizure activity: Denied. History of known exposure to toxins: Denied. Symptoms of chronic pain: Endorsed. She reported ongoing neuropathy which can be distracting and functionally limiting from time to time.  Experience of frequent headaches/migraines: Denied. Frequent instances of dizziness/vertigo: Denied.  Sensory changes: She wears reading glasses and utilizes hearing aids with benefit. Other sensory changes/difficulties (e.g., taste and smell) were denied.  Balance/coordination difficulties: She largely denied balance concerns. She  did acknowledge that neuropathy can impact balance from time to time. She denied any recent falls.  Other motor difficulties: Denied.  Sleep History: Estimated hours obtained each night: 7-8 hours.  Difficulties falling asleep: Denied. Difficulties staying asleep: Denied outside of waking 1-2 times per night to use the restroom.  Feels rested and refreshed upon awakening: Endorsed.  History of snoring: Denied. History of waking up gasping for air: Denied. Witnessed breath cessation while asleep: Denied.  History of vivid dreaming: Denied. Excessive movement while asleep: Denied. Instances of acting out her dreams: Denied.  Psychiatric/Behavioral Health History: Depression: When asked her current mood, she highlighted that she was "trying to readjust" following her husband's passing in 2024. Historically, she denied prior mental health concerns or any formal diagnoses. Current or remote suicidal ideation, intent, or plan was denied.  Anxiety: Denied. Mania: Denied. Trauma History: Denied. Visual/auditory hallucinations: Denied. Delusional thoughts: Denied.  Tobacco: Denied. Alcohol: She reported consuming an occasional glass of wine and denied any previous concerns surrounding alcohol abuse or dependence.  Recreational drugs: Denied.  Family History: Problem Relation Age of Onset   Coronary artery disease Father 22   Hyperlipidemia Father    Heart attack Mother 3   Hyperlipidemia Mother    Osteoporosis Sister    Hyperlipidemia Sister    Heart attack Daughter 84       CABG x 5   Heart disease Son 35       Stents x 3   This information was confirmed by Ms. Swanton.  Academic/Vocational History: Highest level of educational attainment: 12 years. She graduated from high school and described herself as a good (A/B) student in academic settings. No relative weaknesses were identified.  History of developmental delay: Denied. History of grade repetition: Denied. Enrollment in  special education courses: Denied. History of LD/ADHD: Denied.  Employment: Retired. She previously worked in Brewing technologist.  Evaluation Results:   Behavioral Observations: Ms. Carannante was unaccompanied and was appropriately dressed and groomed. She appeared alert. Observed gait and station were within normal limits. Gross motor functioning appeared intact upon informal observation and no abnormal movements (e.g., tremors) were noted. Her affect was generally relaxed and positive, but did range appropriately given the subject being discussed during interview. Spontaneous speech was fluent and word finding difficulties were not observed during the clinical interview. Thought processes were coherent, organized, and normal in content. Insight into her cognitive difficulties appeared poor in that she denied all memory concerns despite testing suggesting significant impairments. I do have concern that she does not fully appreciate the extent of ongoing memory impairment.   During testing, sustained attention was appropriate. Task engagement was adequate and she persisted when challenged. Overall, Ms. Schwiesow was cooperative with the clinical interview and subsequent testing procedures.   Adequacy of Effort: The validity of neuropsychological testing is limited by the extent to which the individual being tested may be assumed to have exerted adequate  effort during testing. Ms. Konkel expressed her intention to perform to the best of her abilities and exhibited adequate task engagement and persistence. Scores across stand-alone and embedded performance validity measures were within expectation. As such, the results of the current evaluation are believed to be a valid representation of Ms. Gallaway's current cognitive functioning.  Test Results: Ms. Sankovich was mildly disoriented at the time of the current evaluation. The phone number she provided does not match what is in her chart. However, the  number may have represented a number prior to her moving in with her daughter. Outside of this, Ms. Kluger was four days off when stating the current date, 72 minutes off when estimating the current time, and was unable to provide the full name of the clinic [facility]").   Intellectual abilities based upon educational and vocational attainment were estimated to be in the average range. Premorbid abilities were estimated to be within the average range based upon a single-word reading test.   Processing speed was average. Basic attention was well above average. Executive functioning was above average.  While not directly assessed, receptive language abilities were believed to be intact. Ms. Bruegger did not exhibit any difficulties comprehending task instructions and answered all questions asked of her appropriately. Assessed expressive language was mildly variable. Phonemic fluency was average, semantic fluency was well below average to below average, and confrontation naming was average.    Assessed visuospatial/visuoconstructional abilities were average to above average. Points were lost on her drawing of a clock due to mild numerical spacing abnormalities and incorrect hand placement.    Learning (i.e., encoding) of novel verbal information was average. Spontaneous delayed recall (i.e., retrieval) of previously learned information was exceptionally low to below average. Retention rates were 17% (raw score of 1) across a list learning task, 11% (raw score of 1) across a story learning task, and 0% across a figure drawing task. Performance across recognition tasks was variable, ranging from the exceptionally low to average normative ranges, suggesting some limited evidence for information consolidation.   Results of emotional screening instruments suggested that recent symptoms of generalized anxiety were in the minimal range, while symptoms of depression were within normal limits. A screening  instrument assessing recent sleep quality suggested the presence of minimal sleep dysfunction.  Table of Scores:   Note: This summary of test scores accompanies the interpretive report and should not be considered in isolation without reference to the appropriate sections in the text. Descriptors are based on appropriate normative data and may be adjusted based on clinical judgment. Terms such as "Within Normal Limits" and "Outside Normal Limits" are used when a more specific description of the test score cannot be determined.       Percentile - Normative Descriptor > 98 - Exceptionally High 91-97 - Well Above Average 75-90 - Above Average 25-74 - Average 9-24 - Below Average 2-8 - Well Below Average < 2 - Exceptionally Low       Validity:   DESCRIPTOR       RBANS EI: --- --- Within Normal Limits       Orientation:      Raw Score Percentile   NAB Orientation, Form 1 22/29 --- ---       Cognitive Screening:      Raw Score Percentile   SLUMS: 15/30 --- ---       RBANS, Form A: Standard Score/ Scaled Score Percentile   Total Score 92 30 Average  Immediate Memory 97 42 Average  List Learning 9 37 Average    Story Memory 10 50 Average  Visuospatial/Constructional 112 79 Above Average    Figure Copy 11 63 Average    Line Orientation 18/20 >75 Above Average  Language 92 30 Average    Picture Naming 9/10 26-50 Average    Semantic Fluency 6 9 Below Average  Attention 118 88 Above Average    Digit Span 15 95 Well Above Average    Coding 11 63 Average  Delayed Memory 56 <1 Exceptionally Low    List Recall 1/10 10-16 Below Average    List Recognition 16/20 3-9 Well Below Average    Story Recall 3 1 Exceptionally Low    Story Recognition 5/12 1-4 Exceptionally Low to Well Below Average    Figure Recall 1 <1 Exceptionally Low    Figure Recognition 4/8 21-38 Below Average  to Average        Intellectual Functioning:      Standard Score Percentile   Test of Premorbid  Functioning: 96 39 Average       Attention/Executive Function:     Trail Making Test (TMT): Raw Score (T Score) Percentile     Part A 47 secs.,  1 error (45) 31 Average    Part B 75 secs.,  0 errors (60) 84 Above Average         Scaled Score Percentile   WAIS-5 Naming Speed Quantity: 11 63 Average        Scaled Score Percentile   WAIS-5 Matrix Reasoning: 13 84 Above Average       Language:     Verbal Fluency Test: Raw Score (Scaled Score) Percentile     Phonemic Fluency (CFL) 27 (9) 37 Average    Category Fluency 22 (5) 5 Well Below Average  *Based on [employer] Older Normative Studies (MOANS)          NAB Language Module, Form 1: T Score Percentile     Naming 28/31 (48) 42 Average       Visuospatial/Visuoconstruction:      Raw Score Percentile   Clock Drawing: 7/10 --- Within Normal Limits       Mood and Personality:      Raw Score Percentile   Geriatric Depression Scale: 2 --- Within Normal Limits  Geriatric Anxiety Scale: 2 --- Minimal    Somatic 0 --- Minimal    Cognitive 1 --- Minimal    Affective 1 --- Minimal       Additional Questionnaires:      Raw Score Percentile   PROMIS Sleep Disturbance Questionnaire: 18 --- None to Slight   Informed Consent and Coding/Compliance:   The current evaluation represents a clinical evaluation for the purposes previously outlined by the referral source and is in no way reflective of a forensic evaluation.   Ms. Jokinen was provided with a verbal description of the nature and purpose of the present neuropsychological evaluation. Also reviewed were the foreseeable risks and/or discomforts and benefits of the procedure, limits of confidentiality, and mandatory reporting requirements of this provider. The patient was given the opportunity to ask questions and receive answers about the evaluation. Oral consent to participate was provided by the patient.   This evaluation was conducted by Melville Stade, Ph.D., ABPP-CN, board certified  clinical neuropsychologist. Ms. Zittel completed a clinical interview with Dr. Kitty Perkins, billed as one unit [phone], and 90 minutes of cognitive testing and scoring, billed as one unit [phone] and two additional units 96137. As a separate and  discrete service, one unit [phone] and two units 96133 (160 minutes) were billed for Dr. Arsenio Larger time spent in interpretation and report writing.

## 2023-07-20 ENCOUNTER — Ambulatory Visit: Payer: Self-pay

## 2023-07-20 ENCOUNTER — Encounter: Payer: Self-pay | Admitting: Psychology

## 2023-07-20 ENCOUNTER — Ambulatory Visit: Payer: Medicare Other | Admitting: Psychology

## 2023-07-20 DIAGNOSIS — G3184 Mild cognitive impairment, so stated: Secondary | ICD-10-CM | POA: Diagnosis not present

## 2023-07-20 HISTORY — DX: Mild cognitive impairment of uncertain or unknown etiology: G31.84

## 2023-07-27 ENCOUNTER — Ambulatory Visit (INDEPENDENT_AMBULATORY_CARE_PROVIDER_SITE_OTHER): Payer: Medicare Other | Admitting: Psychology

## 2023-07-27 DIAGNOSIS — G3184 Mild cognitive impairment, so stated: Secondary | ICD-10-CM

## 2023-07-27 NOTE — Progress Notes (Signed)
   Neuropsychology Feedback Session [name]. [facility] [place] Department of Neurology  Reason for Referral:   Gabriela Smith is a 83 y.o. right-handed Caucasian female referred by Tex Filbert, PA-C, to characterize her current cognitive functioning and assist with diagnostic clarity and treatment planning in the context of subjective cognitive decline.   Feedback:   Gabriela Smith completed a comprehensive neuropsychological evaluation on 07/20/2023. Please refer to that encounter for the full report and recommendations. Briefly, results suggested impairment surrounding delayed retrieval aspects of memory. Further performance variability was exhibited across recognition/consolidation aspects of memory. A relative weakness was also exhibited across semantic fluency. The cause for ongoing memory dysfunction remains uncertain. Despite being able to learn novel information quite well, retention rates after brief delays ranged from 0% to 17%. She also consistently performed poorly across verbal recognition trials. Taken together, this suggests evidence for rapid forgetting and an evolving and already fairly notable storage impairment. This pattern of memory performance can be characteristic of Alzheimer's disease and this illness being present in very early stages remains plausible. Her further weakness surrounding semantic fluency would follow typical disease progression. Her most recent neuroimaging suggested moderate to severe chronic microvascular disease. There remains the possibility of a vascular contribution to impairment given these findings. However, intact learning and amnestic memory performances do not align with cerebrovascular disease expectations particularly well.   With Gabriela Smith's permission, the current telephone feedback call was completed with her daughter who serves as her POA. Her daughter was at an undisclosed location while I was within my office. I discussed the  limitations of evaluation and management by telemedicine and the availability of in person appointments. Gabriela Smith daughter expressed her understanding and agreed to proceed. Content of the current session focused on the results of Gabriela Smith's neuropsychological evaluation. Gabriela Smith daughter was given the opportunity to ask questions and her questions were answered. She was encouraged to reach out should additional questions arise. Gabriela Smith report is available to review on MyChart.      No billing was performed as Gabriela Smith was not physically present and the phone call lasted for less than 31 minutes.

## 2023-08-05 DIAGNOSIS — I4819 Other persistent atrial fibrillation: Secondary | ICD-10-CM | POA: Insufficient documentation

## 2023-08-05 DIAGNOSIS — I350 Nonrheumatic aortic (valve) stenosis: Secondary | ICD-10-CM | POA: Insufficient documentation

## 2023-08-05 DIAGNOSIS — M7989 Other specified soft tissue disorders: Secondary | ICD-10-CM | POA: Insufficient documentation

## 2023-08-05 DIAGNOSIS — I5032 Chronic diastolic (congestive) heart failure: Secondary | ICD-10-CM | POA: Insufficient documentation

## 2023-08-05 NOTE — Progress Notes (Unsigned)
  Cardiology Office Note:   Date:  08/07/2023  ID:  Gabriela, Smith Dec 20, 1940, MRN [id number] PCP: Eliodoro Guerin, DO  [facility] Providers Cardiologist:  Eilleen Grates, MD {  History of Present Illness:   Gabriela Smith is a 83 y.o. female who presents for follow up after CABG.    She had a cardiac cath on 04/19/2019 noting severe multivessel disease, with mild osteal and severe distal left main disease involving the LCx with severe proximal and mid LAD diease, as well as ostial and mid RCA diffuse disease. As a result, she underwent CABG X 4 with LIMA to LAD, SVG to Mid LAD, SVG to OM, and SVG to PDA.    Since I last saw her she was in the hospital in October.  She was admitted with acute on chronic diastolic HF.  Echo demonstrated moderate to severe AS.  I reviewed these records for this visit.  She has done well since I saw her.  The patient denies any new symptoms such as chest discomfort, neck or arm discomfort. There has been no new shortness of breath, PND or orthopnea. There have been no reported palpitations, presyncope or syncope.     ROS: As stated in the HPI and negative for all other systems.   Studies Reviewed:    EKG:   NA  Risk Assessment/Calculations:    CHA2DS2-VASc Score = 6   This indicates a 9.7% annual risk of stroke. The patient's score is based upon: CHF History: 1 HTN History: 1 Diabetes History: 0 Stroke History: 0 Vascular Disease History: 1 Age Score: 2 Gender Score: 1  Physical Exam:   VS:  BP (!) 174/102 (BP Location: Right Arm, Patient Position: Sitting, Cuff Size: Normal)   Pulse 75   Ht 5\' 3"  (1.6 m)   Wt 173 lb 12.8 oz (78.8 kg)   SpO2 96%   BMI 30.79 kg/m    Wt Readings from Last 3 Encounters:  08/07/23 173 lb 12.8 oz (78.8 kg)  03/06/23 178 lb (80.7 kg)  01/29/23 177 lb (80.3 kg)     GEN: Well nourished, well developed in no acute distress NECK: No JVD; No carotid bruits CARDIAC: Irregular RR, 3 of 6 apical systolic  murmur slightly radiating at the aortic outflow tract, no diastolic murmurs, rubs, gallops RESPIRATORY:  Clear to auscultation without rales, wheezing or rhonchi  ABDOMEN: Soft, non-tender, non-distended EXTREMITIES:  No edema; No deformity   ASSESSMENT AND PLAN:   Lower extremity edema:    This is mild.  No change in therapy.   Coronary artery disease : She has no new symptoms.  No change in therapy.   Atrial fibrillation:     Gabriela Smith has a CHA2DS2 - VASc score of 5.   She tolerates anticoagulation.  She has good rate control.  No change in therapy.  Essential hypertension:   Her blood pressure is elevated but she says it is controlled at home.  They are going to keep a blood pressure diary.  She probably will need adjustments to her amlodipine .  Dyslipidemia:   LDL is 33.  No change in therapy.  Aortic stenosis:  This was moderate to severe on echo in October 2024.  I will follow this clinically.  I will likely check an echocardiogram after the next appt.      Follow up with me in one year.   Signed, Eilleen Grates, MD

## 2023-08-07 ENCOUNTER — Encounter: Payer: Self-pay | Admitting: Cardiology

## 2023-08-07 ENCOUNTER — Ambulatory Visit: Attending: Cardiology | Admitting: Cardiology

## 2023-08-07 VITALS — BP 174/102 | HR 75 | Ht 63.0 in | Wt 173.8 lb

## 2023-08-07 DIAGNOSIS — I10 Essential (primary) hypertension: Secondary | ICD-10-CM

## 2023-08-07 DIAGNOSIS — I5032 Chronic diastolic (congestive) heart failure: Secondary | ICD-10-CM

## 2023-08-07 DIAGNOSIS — I4819 Other persistent atrial fibrillation: Secondary | ICD-10-CM | POA: Diagnosis not present

## 2023-08-07 DIAGNOSIS — E785 Hyperlipidemia, unspecified: Secondary | ICD-10-CM | POA: Diagnosis not present

## 2023-08-07 DIAGNOSIS — M7989 Other specified soft tissue disorders: Secondary | ICD-10-CM | POA: Diagnosis not present

## 2023-08-07 DIAGNOSIS — I350 Nonrheumatic aortic (valve) stenosis: Secondary | ICD-10-CM | POA: Diagnosis not present

## 2023-08-07 NOTE — Patient Instructions (Signed)
 Medication Instructions:  Your physician recommends that you continue on your current medications as directed. Please refer to the Current Medication list given to you today.  *If you need a refill on your cardiac medications before your next appointment, please call your pharmacy*  Lab Work: CBC today If you have labs (blood work) drawn today and your tests are completely normal, you will receive your results only by: MyChart Message (if you have MyChart) OR A paper copy in the mail If you have any lab test that is abnormal or we need to change your treatment, we will call you to review the results.  Testing/Procedures: NONE  Follow-Up: At [facility], you and your health needs are our priority.  As part of our continuing mission to provide you with exceptional heart care, our providers are all part of one team.  This team includes your primary Cardiologist (physician) and Advanced Practice Providers or APPs (Physician Assistants and Nurse Practitioners) who all work together to provide you with the care you need, when you need it.  Your next appointment:   1 year(s)  Provider:   Lavonne Prairie, MD  We recommend signing up for the patient portal called "MyChart".  Sign up information is provided on this After Visit Summary.  MyChart is used to connect with patients for Virtual Visits (Telemedicine).  Patients are able to view lab/test results, encounter notes, upcoming appointments, etc.  Non-urgent messages can be sent to your provider as well.   To learn more about what you can do with MyChart, go to [email or web address].   Other Instructions Check blood pressure and record on log

## 2023-08-08 LAB — CBC
Hematocrit: 38 % (ref 34.0–46.6)
Hemoglobin: 12.4 g/dL (ref 11.1–15.9)
MCH: 32.8 pg (ref 26.6–33.0)
MCHC: 32.6 g/dL (ref 31.5–35.7)
MCV: 101 fL — ABNORMAL HIGH (ref 79–97)
Platelets: 263 10*3/uL (ref 150–450)
RBC: 3.78 x10E6/uL (ref 3.77–5.28)
RDW: 14 % (ref 11.7–15.4)
WBC: 8.8 10*3/uL (ref 3.4–10.8)

## 2023-08-09 ENCOUNTER — Ambulatory Visit: Payer: Self-pay | Admitting: Cardiology

## 2023-09-18 ENCOUNTER — Other Ambulatory Visit: Payer: Self-pay | Admitting: Family Medicine

## 2023-09-18 DIAGNOSIS — E039 Hypothyroidism, unspecified: Secondary | ICD-10-CM

## 2023-09-24 ENCOUNTER — Other Ambulatory Visit: Payer: Self-pay | Admitting: Family Medicine

## 2023-09-24 DIAGNOSIS — I480 Paroxysmal atrial fibrillation: Secondary | ICD-10-CM

## 2023-09-24 NOTE — Telephone Encounter (Signed)
 [facility] last OV 08/25/22 NO RF sent to pharmacy last OV greater than a year

## 2023-09-25 MED ORDER — AMLODIPINE BESYLATE 5 MG PO TABS: 5.0000 mg | ORAL_TABLET | Freq: Every day | ORAL | 3 refills | Status: DC

## 2023-09-25 NOTE — Telephone Encounter (Signed)
 Appt scheduled for 10/06/2023

## 2023-09-25 NOTE — Telephone Encounter (Signed)
Please send refill to pharmacy

## 2023-09-25 NOTE — Addendum Note (Signed)
 Addended by: Channon Ambrosini D on: 09/25/2023 08:40 AM   Modules accepted: Orders

## 2023-10-06 ENCOUNTER — Ambulatory Visit (INDEPENDENT_AMBULATORY_CARE_PROVIDER_SITE_OTHER): Admitting: Family Medicine

## 2023-10-06 ENCOUNTER — Encounter: Payer: Self-pay | Admitting: Family Medicine

## 2023-10-06 VITALS — BP 137/67 | HR 100 | Temp 98.4°F | Ht 63.0 in | Wt 173.4 lb

## 2023-10-06 DIAGNOSIS — M8580 Other specified disorders of bone density and structure, unspecified site: Secondary | ICD-10-CM | POA: Diagnosis not present

## 2023-10-06 DIAGNOSIS — E559 Vitamin D deficiency, unspecified: Secondary | ICD-10-CM

## 2023-10-06 DIAGNOSIS — Z0001 Encounter for general adult medical examination with abnormal findings: Secondary | ICD-10-CM

## 2023-10-06 DIAGNOSIS — G3184 Mild cognitive impairment, so stated: Secondary | ICD-10-CM | POA: Diagnosis not present

## 2023-10-06 DIAGNOSIS — I739 Peripheral vascular disease, unspecified: Secondary | ICD-10-CM | POA: Diagnosis not present

## 2023-10-06 DIAGNOSIS — G609 Hereditary and idiopathic neuropathy, unspecified: Secondary | ICD-10-CM

## 2023-10-06 DIAGNOSIS — L989 Disorder of the skin and subcutaneous tissue, unspecified: Secondary | ICD-10-CM

## 2023-10-06 DIAGNOSIS — I2510 Atherosclerotic heart disease of native coronary artery without angina pectoris: Secondary | ICD-10-CM

## 2023-10-06 DIAGNOSIS — I714 Abdominal aortic aneurysm, without rupture, unspecified: Secondary | ICD-10-CM | POA: Diagnosis not present

## 2023-10-06 DIAGNOSIS — Z0000 Encounter for general adult medical examination without abnormal findings: Secondary | ICD-10-CM

## 2023-10-06 DIAGNOSIS — I83893 Varicose veins of bilateral lower extremities with other complications: Secondary | ICD-10-CM

## 2023-10-06 DIAGNOSIS — I10 Essential (primary) hypertension: Secondary | ICD-10-CM

## 2023-10-06 DIAGNOSIS — I4819 Other persistent atrial fibrillation: Secondary | ICD-10-CM | POA: Diagnosis not present

## 2023-10-06 MED ORDER — MUPIROCIN CALCIUM 2 % EX CREA: 1.0000 | TOPICAL_CREAM | Freq: Two times a day (BID) | CUTANEOUS | 0 refills | Status: AC

## 2023-10-06 NOTE — Patient Instructions (Signed)
Chronic Venous Insufficiency Chronic venous insufficiency is a condition that causes the veins in the legs to struggle to pump blood from the legs to the heart. It is also called venous stasis. This condition can happen when the vein walls are stretched, weakened, or damaged. It can also happen when the valves inside the vein are damaged. With the right treatment, you should be able to still lead an active life. What are the causes? Common causes of this condition include: Venous hypertension. This is high blood pressure inside the veins. Sitting or standing too long. This can cause increased blood pressure in the veins of the leg. Deep vein thrombosis (DVT). This is a blood clot that blocks blood flow in a vein. Phlebitis. This is inflammation of a vein. It can cause a blood clot to form. An abnormal growth of cells (tumor) in the area between your hip bones (pelvis). This can cause blood to back up. What increases the risk? Factors that may make you more likely to get this condition include: Having a family history of the condition. Being overweight. Being pregnant. Not getting enough exercise. Smoking. Having a job that requires you to sit or stand in one place for a long time. Being a certain age. Females in their 23s and 5s and males in their 20s are more likely to get this condition. What are the signs or symptoms? Symptoms of this condition include: Varicose veins. These are veins that are enlarged, bulging, or twisted. Skin breakdown or ulcers. Reddened skin or dark discoloration of the skin on the leg between the knee and ankle. Lipodermatosclerosis. This is brown, smooth, tight, and painful skin just above the ankle. It is often on the inside of the leg. Swelling of the legs. How is this diagnosed? This condition may be diagnosed based on your medical history and a physical exam. You may also need tests, such as: A duplex ultrasound. This shows how blood flows through a blood  vessel. Plethysmography. This tests blood flow. Venogram. This looks at the veins using an X-ray and dye. How is this treated? The goals of treatment are to help you return to an active life and to relieve pain. Treatment may include: Wearing compression stockings. These do not cure the condition but can help relieve symptoms. They can also help stop your condition from getting worse. Sclerotherapy. This involves injecting a solution to shrink damaged veins. Surgery. This may include: Vein stripping. This is when a diseased vein is taken out. Laser ablation surgery. This is when blood flow is cut off through the vein. Repairing or remaking a valve inside the affected vein. Follow these instructions at home: Lifestyle Do not use any products that contain nicotine or tobacco. These products include cigarettes, chewing tobacco, and vaping devices, such as e-cigarettes. If you need help quitting, ask your health care provider. Stay active. Exercise, walk, or do other activities. Ask your provider what activities are safe for you. General instructions Take over-the-counter and prescription medicines only as told by your provider. Drink enough fluid to keep your pee (urine) pale yellow. Wear compression stockings as told by your provider. These stockings help to prevent blood clots and reduce swelling in your legs. Keep all follow-up visits. Your provider will check your legs for any changes and adjust your treatment plan as needed. Contact a health care provider if: You have redness, swelling, or more pain in the affected area. You see a red streak or line that goes up or down from the  area. You have skin breakdown or skin loss. You get an injury in the affected area. You get a fever. Get help right away if: You have severe pain that does not get better with medicine. You get an injury and an open wound in the affected area. Your foot or ankle becomes numb or weak all of a sudden. You have  trouble moving your foot or ankle. Your symptoms do not go away or get worse. You have chest pain. You have shortness of breath. These symptoms may be an emergency. Get help right away. Call 911. Do not wait to see if the symptoms will go away. Do not drive yourself to the hospital. This information is not intended to replace advice given to you by your health care provider. Make sure you discuss any questions you have with your health care provider. Document Revised: 03/11/2022 Document Reviewed: 03/11/2022 Elsevier Patient Education  2024 [employer].

## 2023-10-06 NOTE — Progress Notes (Signed)
 Gabriela Smith is a 83 y.o. female presents to office today for annual physical exam examination.    Concerns today include: 1.  Leg edema Ongoing leg edema.  Has compression hose and she tries to try to put them on daily but notes that it causes a lot of itching in her legs so she is disinclined to keep them on all day.  She has history of mild PAD.  She does have a sore on the right leg and this is because she picks at her skin all the time.  She has several similar sores on her upper extremities per her daughter's report, who is present with her today.  She has been seen by dermatology for this and started on Zyrtec.  She continues to suffer from MCI and is followed up with neurology recently.  They have ongoing close follow-up to monitor for any progression of disease.  She is treated with Aricept  for this.  2.  Neuropathy Has longstanding history of bilateral neuropathy of the feet.  She is not a diabetic.  She uses a roll-on her feet and this does provide temporary relief along with Tylenol .  Not on any supplementation for this.  No known B12 deficiency  Occupation: Retired, Substance use: None Health Maintenance Due  Topic Date Due   COVID-19 Vaccine (7 - 2024-25 season) 11/09/2022   Medicare Annual Wellness (AWV)  02/05/2023   Refills needed today: none  Immunization History  Administered Date(s) Administered   Fluad Quad(high Dose 65+) 11/17/2018, 12/09/2019, 12/11/2020, 12/09/2021   Influenza, High Dose Seasonal PF 12/07/2015, 12/16/2016, 12/21/2017   Influenza, Seasonal, Injecte, Preservative Fre 01/15/2009, 12/13/2009, 12/18/2010, 01/07/2012   Influenza,inj,Quad PF,6+ Mos 12/21/2012, 01/04/2014, 12/25/2014   Influenza-Unspecified 01/06/1997   Moderna Covid-19 Vaccine Bivalent Booster 5yrs & up 12/11/2020   Novel Infuenza-h1n1-09 05/03/2008   PFIZER Comirnaty(Gray Top)Covid-19 Tri-Sucrose Vaccine 08/02/2020   PFIZER(Purple Top)SARS-COV-2 Vaccination 03/30/2019, 06/25/2019,  11/29/2019, 08/02/2020   Pneumococcal Conjugate,unspecified 06/28/2014   Pneumococcal Conjugate-13 06/28/2014   Pneumococcal Polysaccharide-23 11/04/2012   Tdap 07/01/2010, 10/09/2010, 11/06/2010, 08/25/2022   Zoster Recombinant(Shingrix) 03/28/2021, 05/27/2021   Zoster, Live 07/15/2006   Past Medical History:  Diagnosis Date   AAA (abdominal aortic aneurysm) without rupture 08/24/2015   Acute CHF (congestive heart failure) 04/15/2019   Acute heart failure with preserved ejection fraction (HFpEF) 12/12/2022   Acute respiratory failure with hypoxia 12/12/2022   Aortic atherosclerosis 02/11/2017   Arthritis    OA lt knee- cortizone inj. q 4 months AND OA ALSO IN BACK. Had knee replaced-issue resolved   Atrophic vaginitis 09/18/2016   CAD (coronary artery disease)    1993-- PTCA. Pt describes a near total blockage of apparently the LAD and a 65% stenosis elsewhere.   Chronic venous insufficiency 01/06/2023   Coronary atherosclerosis of native coronary artery 11/28/2008   Degenerative disc disease, lumbar 02/08/2013   Edema 07/03/2020   Essential hypertension 12/12/2022   History of bladder cancer    hx  TCC of bladder ,  Ta G3-  first occurence 02-20-2012--  s/p BCG tx's  and  mitomycin  C   Hyperlipidemia LDL goal <70 11/28/2008   Hypokalemia 12/12/2022   Intermittent claudication 02/17/2019   Malignant neoplasm of urinary bladder 11/04/2012   Mild cognitive impairment with memory loss 07/20/2023   Nocturia    Osteoarthritis of left knee 11/04/2012   Osteopenia with high risk of fracture 04/20/2013   PAD (peripheral artery disease) 01/06/2023   Paroxysmal atrial fibrillation 08/12/2019   Peripheral neuropathy 03/07/2019  Peripheral vascular insufficiency 02/17/2019   S/P CABG x 4 04/26/2019   S/P left TKA 11/22/2012   Sigmoid diverticulosis    Status post hip replacement, right 12/25/2015   Vitamin D  deficiency 02/08/2013   Social History   Socioeconomic History    Marital status: Widowed    Spouse name: Lynwood   Number of children: 3   Years of education: 12   Highest education level: High school graduate  Occupational History   Occupation: retired  Tobacco Use   Smoking status: Former    Current packs/day: 0.00    Average packs/day: 0.5 packs/day for 5.0 years (2.5 ttl pk-yrs)    Types: Cigarettes    Start date: 03/10/1971    Quit date: 03/09/1976    Years since quitting: 47.6   Smokeless tobacco: Never  Vaping Use   Vaping status: Never Used  Substance and Sexual Activity   Alcohol use: Not Currently    Comment: rare glass of wine   Drug use: No   Sexual activity: Not Currently  Other Topics Concern   Not on file  Social History Narrative   Married with 3 children and 4 grandchildren.  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 with Parkinsons   Lives in garage apartment at her daughters home.   Right handed   Drinks caffeine prn   retired   Chief Executive Officer Drivers of [employer]: Low Risk  (02/04/2022)   Overall Financial Resource Strain (CARDIA)    Difficulty of Paying Living Expenses: Not hard at all  Food Insecurity: No Food Insecurity (12/15/2022)   Hunger Vital Sign    Worried About Running Out of Food in the Last Year: Never true    Ran Out of Food in the Last Year: Never true  Transportation Needs: No Transportation Needs (12/15/2022)   PRAPARE - Administrator, Civil Service (Medical): No    Lack of Transportation (Non-Medical): No  Physical Activity: Insufficiently Active (02/04/2022)   Exercise Vital Sign    Days of Exercise per Week: 3 days    Minutes of Exercise per Session: 30 min  Stress: No Stress Concern Present (02/04/2022)   [employer] of Occupational Health - Occupational Stress Questionnaire    Feeling of Stress : Not at all  Social Connections: Moderately Integrated (02/04/2022)   Social Connection and Isolation Panel    Frequency of Communication with Friends and Family: More than three  times a week    Frequency of Social Gatherings with Friends and Family: More than three times a week    Attends Religious Services: 1 to 4 times per year    Active Member of [employer] or Organizations: No    Attends Banker Meetings: Never    Marital Status: Married  Catering manager Violence: Not At Risk (12/12/2022)   Humiliation, Afraid, Rape, and Kick questionnaire    Fear of Current or Ex-Partner: No    Emotionally Abused: No    Physically Abused: No    Sexually Abused: No   Past Surgical History:  Procedure Laterality Date   CHOLECYSTECTOMY  2003 (approx)   CLIPPING OF ATRIAL APPENDAGE N/A 04/25/2019   Procedure: Clipping Of Atrial Appendage;  Surgeon: Shyrl Linnie KIDD, MD;  Location: [facility];  Service: Open Heart Surgery;  Laterality: N/A;   COLONOSCOPY  05-31-2003   CORONARY ANGIOPLASTY  1993   to LAD   CORONARY ARTERY BYPASS GRAFT N/A 04/25/2019   Procedure: CORONARY ARTERY BYPASS GRAFTING (CABG) times four using left  internal mammary artery and left greater saphenous vein harvested endoscipically.;  Surgeon: Shyrl Linnie KIDD, MD;  Location: [facility] OR;  Service: Open Heart Surgery;  Laterality: N/A;   CYSTOSCOPY WITH BIOPSY  05/09/2011   Procedure: CYSTOSCOPY WITH BIOPSY;  Surgeon: Oneil JAYSON Rafter, MD;  Location: [facility];  Service: Urology;  Laterality: N/A;  WITH BLADDER BIOPSY GYRUS   LEFT HEART CATH AND CORONARY ANGIOGRAPHY N/A 04/19/2019   Procedure: LEFT HEART CATH AND CORONARY ANGIOGRAPHY;  Surgeon: Anner Alm ORN, MD;  Location: [facility] INVAS[facility] CV [facility];  Service: Cardiovascular;  Laterality: N/A;   TOTAL HIP ARTHROPLASTY Right 12/25/2015   Procedure: RIGHT TOTAL HIP ARTHROPLASTY ANTERIOR APPROACH;  Surgeon: Donnice Car, MD;  Location: [facility];  Service: Orthopedics;  Laterality: Right;   TOTAL KNEE ARTHROPLASTY Left 11/22/2012   Procedure: LEFT TOTAL KNEE ARTHROPLASTY;  Surgeon: Donnice JONETTA Car, MD;  Location: [facility];  Service: Orthopedics;  Laterality:  Left;   TRANSURETHRAL RESECTION OF BLADDER TUMOR  03/14/2011   Procedure: TRANSURETHRAL RESECTION OF BLADDER TUMOR (TURBT);  Surgeon: Mark C Ottelin, MD;  Location: [facility];  Service: Urology;  Laterality: N/A;   TRANSURETHRAL RESECTION OF BLADDER TUMOR  05/09/2011   Procedure: TRANSURETHRAL RESECTION OF BLADDER TUMOR (TURBT);  Surgeon: Mark C Ottelin, MD;  Location[facility];  Service: Urology;  Laterality: N/A;   TRANSURETHRAL RESECTION OF BLADDER TUMOR  03/08/2012   Procedure: TRANSURETHRAL RESECTION OF BLADDER TUMOR (TURBT);  Surgeon: Mark C Ottelin, MD;  Location: [facility];  Service: Urology;  Laterality: N/A;   TRANSURETHRAL RESECTION OF BLADDER TUMOR WITH GYRUS (TURBT-GYRUS) N/A 03/24/2014   Procedure: TRANSURETHRAL RESECTION OF BLADDER TUMOR WITH GYRUS (TURBT-GYRUS);  Surgeon: Mark C Ottelin, MD;  Location: [facility];  Service: Urology;  Laterality: N/A;   UMBILICAL HERNIA REPAIR  2001  (approx)   Family History  Problem Relation Age of Onset   Coronary artery disease Father 68   Hyperlipidemia Father    Heart attack Mother 46   Hyperlipidemia Mother    Osteoporosis Sister    Hyperlipidemia Sister    Heart attack Daughter 65       CABG x 5   Heart disease Son 75       Stents x 3    Current Outpatient Medications:    acetaminophen  (TYLENOL ) 500 MG tablet, Take 2 tablets (1,000 mg total) by mouth every 8 (eight) hours. (Patient taking differently: Take 1,000 mg by mouth every 6 (six) hours as needed for moderate pain (pain score 4-6).), Disp: 30 tablet, Rfl: 0   amLODipine  (NORVASC ) 5 MG tablet, Take 1 tablet (5 mg total) by mouth daily., Disp: 90 tablet, Rfl: 3   apixaban  (ELIQUIS ) 5 MG TABS tablet, Take 1 tablet (5 mg total) by mouth 2 (two) times daily., Disp: 180 tablet, Rfl: 1   aspirin  EC 81 MG EC tablet, Take 1 tablet (81 mg total) by mouth daily., Disp:  , Rfl:    cholecalciferol  (VITAMIN D ) 1000 UNITS  tablet, Take 1,000 Units by mouth daily after breakfast. , Disp: , Rfl:    Co-Enzyme Q10 200 MG CAPS, Take 200 mg by mouth daily after breakfast.  (Patient not taking: Reported on 08/07/2023), Disp: , Rfl:    digoxin  (LANOXIN ) 0.125 MG tablet, TAKE ONE TABLET BY MOUTH DAILY, Disp: 90 tablet, Rfl: 3   donepezil  (ARICEPT ) 10 MG tablet, Take 1 tablet (10 mg total) by mouth daily., Disp: 30 tablet, Rfl: 11  Flaxseed, Linseed, (FLAX SEED OIL) 1000 MG CAPS, Take 1,000 mg by mouth daily after breakfast. , Disp: , Rfl:    furosemide  (LASIX ) 40 MG tablet, TAKE ONE TABLET BY MOUTH EVERY DAY, Disp: 90 tablet, Rfl: 2   GNP VITAMIN B-12 500 MCG tablet, TAKE 1 TABLET DAILY, Disp: 90 tablet, Rfl: 0   hydrocortisone  1 % lotion, Apply 1 application. topically 2 (two) times daily. (Patient taking differently: Apply 1 application  topically 2 (two) times daily as needed for itching (Dermatitis).), Disp: 118 mL, Rfl: 0   levothyroxine  (SYNTHROID ) 50 MCG tablet, Take 1 tablet (50 mcg total) by mouth daily., Disp: 100 tablet, Rfl: 0   Magnesium  300 MG CAPS, Take 300 mg by mouth at bedtime. , Disp: , Rfl:    metoprolol  tartrate (LOPRESSOR ) 100 MG tablet, Take 1 tablet (100 mg total) by mouth 2 (two) times daily., Disp: 180 tablet, Rfl: 3   nitroGLYCERIN  (NITROSTAT ) 0.4 MG SL tablet, PLACE 1 TABLET UNDER THE TONGUE AT ONSET OF CHEST PAIN EVERY 5 MINTUES UP TO 3 TIMES AS NEEDED, Disp: 25 tablet, Rfl: 0   potassium chloride  SA (KLOR-CON  M) 20 MEQ tablet, Take 1 tablet (20 mEq total) by mouth daily. (Patient not taking: Reported on 08/07/2023), Disp: 30 tablet, Rfl: 1   REPATHA  SURECLICK 140 MG/ML SOAJ, INJECT 140mg  (1ml) EVERY TWO WEEKS AS DIRECTED, Disp: 2 mL, Rfl: 11   triamcinolone  (KENALOG ) 0.025 % ointment, Apply 1 Application topically 2 (two) times daily., Disp: 30 g, Rfl: 0   TURMERIC PO, Take 1 capsule by mouth daily after breakfast., Disp: , Rfl:  No current facility-administered medications for this  visit.  Facility-Administered Medications Ordered in Other Visits:    DOBUTamine  (DOBUTREX ) 1,000 mcg/mL in dextrose  5% 250 mL infusion, 20 mcg/kg/min, Intravenous, Continuous, Maranda Leim DEL, MD, Last Rate: 96.4 mL/hr at 12/24/15 0840, 20 mcg/kg/min at 12/24/15 0840  Allergies  Allergen Reactions   Fish Allergy Hives, Shortness Of Breath and Other (See Comments)    Bottom-feeder fish   Fish-Derived Products Hives, Shortness Of Breath and Other (See Comments)    Bottom-feeder fish   Shellfish-Derived Products Hives and Shortness Of Breath   Lasix  [Furosemide ] Swelling and Other (See Comments)    Edema, elevated BP   Codeine Rash   Latex Itching and Rash    Reports Elastic in underwear, under wire bras, and now surgical cap cause rash and itching.    Macrodantin [Nitrofurantoin Macrocrystal] Other (See Comments)    Unknown reaction    Niacin Other (See Comments)    Muscle aches   Niaspan [Niacin Er (Antihyperlipidemic)] Other (See Comments)    Muscle aches   Statins Other (See Comments)    Muscle aches/ cramps   Sulfa Antibiotics Rash and Other (See Comments)    Muscle aches/ cramps, also   Vicodin [Hydrocodone -Acetaminophen ] Nausea And Vomiting and Other (See Comments)    SEVERE N/V   Zetia  [Ezetimibe ] Other (See Comments)    Muscle aches     ROS: Review of Systems A comprehensive review of systems was negative except for: Ears, nose, mouth, throat, and face: positive for hearing loss and wears hearing aids Cardiovascular: positive for lower extremity edema Integument/breast: positive for skin lesion(s) Neurological: positive for memory problems and neuropathy    Physical exam BP 137/67   Pulse 100   Temp 98.4 F (36.9 C)   Ht 5' 3 (1.6 m)   Wt 173 lb 6.4 oz (78.7 kg)   SpO2 95%  BMI 30.72 kg/m  General appearance: alert, cooperative, appears stated age, and no distress Head: Normocephalic, without obvious abnormality, atraumatic Eyes: negative findings:  lids and lashes normal, conjunctivae and sclerae normal, corneas clear, and pupils equal, round, reactive to light and accomodation Ears: Hearing aids present bilaterally Nose: Nares normal. Septum midline. Mucosa normal. No drainage or sinus tenderness. Throat: lips, mucosa, and tongue normal; teeth and gums normal Neck: no adenopathy, supple, symmetrical, trachea midline, and thyroid  not enlarged, symmetric, no tenderness/mass/nodules Back: symmetric, no curvature. ROM normal. No CVA tenderness. Lungs: clear to auscultation bilaterally Heart: Irregularly irregular with borderline tachycardia. Abdomen: soft, non-tender; bowel sounds normal; no masses,  no organomegaly Extremities: Nonpitting edema to mid shins bilaterally with associated varicose veins.  She has a lesion on the lateral aspect of the right lower leg.  See below Pulses: 2+ and symmetric Skin: Has a hyperkeratotic lesion on the right anterior lateral lower leg.  No active bleeding.  She has several picked at areas on her upper extremities and areas of postinflammatory hypopigmentation noted Lymph nodes: Cervical, supraclavicular, and axillary nodes normal. Neurologic: Very pleasant, interactive.  Does not appear to be responding to internal stimuli.  She is a little aloof but otherwise thought process and speech are appropriate       10/06/2023    2:14 PM 03/06/2023    3:07 PM 09/17/2022   11:57 AM  Depression screen PHQ 2/9  Decreased Interest 0  0  Down, Depressed, Hopeless 0 0 0  PHQ - 2 Score 0 0 0  Altered sleeping 0  0  Tired, decreased energy 0  0  Change in appetite 0  0  Feeling bad or failure about yourself  0  0  Trouble concentrating 0  0  Moving slowly or fidgety/restless 0  0  Suicidal thoughts 0  0  PHQ-9 Score 0  0  Difficult doing work/chores Not difficult at all  Not difficult at all      10/06/2023    2:14 PM 03/06/2023    3:07 PM 09/17/2022   11:57 AM 09/02/2022    9:34 AM  GAD 7 : Generalized  Anxiety Score  Nervous, Anxious, on Edge 0 0 0 2  Control/stop worrying 0 0 0 2  Worry too much - different things 0 0 0 1  Trouble relaxing 0 0 0 0  Restless 0 0 0 0  Easily annoyed or irritable 0 0 0 0  Afraid - awful might happen 0 0 0 0  Total GAD 7 Score 0 0 0 5  Anxiety Difficulty Not difficult at all Not difficult at all Not difficult at all Not difficult at all     Assessment/ Plan: Nichole ONEIDA [place] here for annual physical exam.   Annual physical exam  Persistent atrial fibrillation (HCC) - Plan: CMP14+EGFR, TSH, Magnesium , Digoxin  level  Varicose veins of leg with edema, bilateral - Plan: Compression stockings  Sore on leg - Plan: mupirocin  cream (BACTROBAN ) 2 %  PAD (peripheral artery disease) - Plan: CMP14+EGFR  Idiopathic peripheral neuropathy - Plan: Vitamin B12  Mild cognitive impairment with memory loss - Plan: CMP14+EGFR  Essential hypertension - Plan: CMP14+EGFR  Atherosclerosis of native coronary artery of native heart without angina pectoris - Plan: CMP14+EGFR, Lipid Panel  Abdominal aortic aneurysm (AAA) without rupture, unspecified part (HCC) - Plan: CMP14+EGFR  Osteopenia with high risk of fracture - Plan: CMP14+EGFR, VITAMIN D  25 Hydroxy (Vit-D Deficiency, Fractures)  Vitamin D  deficiency - Plan: CMP14+EGFR, VITAMIN D   25 Hydroxy (Vit-D Deficiency, Fractures)  Will check magnesium  level, dig, thyroid  and CMP.  She was borderline tachycardic but she was moving about a bit so I wonder if this is exertional.  She demonstrated some lower extremity edema and has known mild PAD her ABIs were obtained previously.  I did offer Unna boots today given the lesion that is on the right lower extremity and we did not wrap these as tight as normal given known PAD.  May keep these on for the next couple of days unless they start bothering her which case of course I would like her to discontinue use.  I have wrote a new prescription for compression hose that hopefully are  latex free and are more comfortable for her.  Will check B12 given some neuropathy that was noted on today's exam.  I encouraged her to consider alpha lipoic acid and her daughter ordered this while they were sitting in the office  Continue to follow-up with neurology for MCI  Blood pressure was controlled.  Continue current medications as prescribed by cardiology.  No further plans for monitoring of AAA given MCI.  She is clinically asymptomatic  Check vitamin D , calcium  level given osteopenia with high risk of fracture  Counseled on healthy lifestyle choices, including diet (rich in fruits, vegetables and lean meats and low in salt and simple carbohydrates) and exercise (at least 30 minutes of moderate physical activity daily).  Patient to follow up 31m  Idy Rawling M. Jolinda, DO

## 2023-10-07 ENCOUNTER — Ambulatory Visit: Payer: Self-pay | Admitting: Family Medicine

## 2023-10-07 LAB — LIPID PANEL
Chol/HDL Ratio: 2.7 ratio (ref 0.0–4.4)
Cholesterol, Total: 112 mg/dL (ref 100–199)
HDL: 41 mg/dL (ref >39)
LDL Chol Calc (NIH): 32 mg/dL (ref 0–99)
Triglycerides: 257 mg/dL — ABNORMAL HIGH (ref 0–149)
VLDL Cholesterol Cal: 39 mg/dL (ref 5–40)

## 2023-10-07 LAB — VITAMIN D 25 HYDROXY (VIT D DEFICIENCY, FRACTURES): Vit D, 25-Hydroxy: 47.3 ng/mL (ref 30.0–100.0)

## 2023-10-07 LAB — CMP14+EGFR
ALT: 15 IU/L (ref 0–32)
AST: 22 IU/L (ref 0–40)
Albumin: 4.6 g/dL (ref 3.7–4.7)
Alkaline Phosphatase: 98 IU/L (ref 44–121)
BUN/Creatinine Ratio: 10 — ABNORMAL LOW (ref 12–28)
BUN: 9 mg/dL (ref 8–27)
Bilirubin Total: 0.4 mg/dL (ref 0.0–1.2)
CO2: 26 mmol/L (ref 20–29)
Calcium: 9.9 mg/dL (ref 8.7–10.3)
Chloride: 98 mmol/L (ref 96–106)
Creatinine, Ser: 0.86 mg/dL (ref 0.57–1.00)
Globulin, Total: 2.7 g/dL (ref 1.5–4.5)
Glucose: 159 mg/dL — ABNORMAL HIGH (ref 70–99)
Potassium: 3.6 mmol/L (ref 3.5–5.2)
Sodium: 141 mmol/L (ref 134–144)
Total Protein: 7.3 g/dL (ref 6.0–8.5)
eGFR: 67 mL/min/1.73 (ref >59)

## 2023-10-07 LAB — DIGOXIN LEVEL: Digoxin, Serum: 0.7 ng/mL (ref 0.5–0.9)

## 2023-10-07 LAB — TSH: TSH: 2.79 u[IU]/mL (ref 0.450–4.500)

## 2023-10-07 LAB — VITAMIN B12: Vitamin B-12: 1197 pg/mL (ref 232–1245)

## 2023-10-07 LAB — MAGNESIUM: Magnesium: 2.3 mg/dL (ref 1.6–2.3)

## 2023-10-13 ENCOUNTER — Ambulatory Visit

## 2023-10-15 DIAGNOSIS — C44722 Squamous cell carcinoma of skin of right lower limb, including hip: Secondary | ICD-10-CM | POA: Diagnosis not present

## 2023-10-15 DIAGNOSIS — L281 Prurigo nodularis: Secondary | ICD-10-CM | POA: Diagnosis not present

## 2023-11-11 ENCOUNTER — Telehealth: Payer: Self-pay

## 2023-11-11 NOTE — Telephone Encounter (Signed)
 Spoke to Gabriela Smith and she states that patient is still having leg edema and neuropathy.  She has been doing all the stuff otc, wearing compression, and elevating her feet and states no better.  At last office visit daughter states that you mentioned gabapentin  but patient can not take that.  Would like to know what else you can recommend?

## 2023-11-11 NOTE — Telephone Encounter (Signed)
 Copied from CRM [id number]. Topic: General - Other >> Nov 11, 2023  3:28 PM Donna BRAVO wrote: Reason for CRM: patient daughter  Amy  returning call from [place]

## 2023-11-11 NOTE — Telephone Encounter (Signed)
 She has some known peripheral artery disease that may be contributing.  I think it's reasonable to talk to the vascular specialist about the ongoing swelling.  She may be a candidate for intervention if she is failing conservative treatments

## 2023-11-12 NOTE — Telephone Encounter (Signed)
Daughter aware and verbalizes understanding per dpr.

## 2023-12-01 ENCOUNTER — Other Ambulatory Visit: Payer: Self-pay | Admitting: Cardiology

## 2023-12-03 ENCOUNTER — Ambulatory Visit

## 2023-12-03 VITALS — BP 137/67 | HR 100 | Ht 63.0 in | Wt 173.0 lb

## 2023-12-03 DIAGNOSIS — Z0000 Encounter for general adult medical examination without abnormal findings: Secondary | ICD-10-CM | POA: Diagnosis not present

## 2023-12-03 NOTE — Patient Instructions (Signed)
 [name],  Thank you for taking the time for your Medicare Wellness Visit. I appreciate your continued commitment to your health goals. Please review the care plan we discussed, and feel free to reach out if I can assist you further.  Medicare recommends these wellness visits once per year to help you and your care team stay ahead of potential health issues. These visits are designed to focus on prevention, allowing your provider to concentrate on managing your acute and chronic conditions during your regular appointments.  Please note that Annual Wellness Visits do not include a physical exam. Some assessments may be limited, especially if the visit was conducted virtually. If needed, we may recommend a separate in-person follow-up with your provider.  Ongoing Care Seeing your primary care provider every 3 to 6 months helps us  monitor your health and provide consistent, personalized care.   Referrals If a referral was made during today's visit and you haven't received any updates within two weeks, please contact the referred provider directly to check on the status.  Recommended Screenings:  Health Maintenance  Topic Date Due   Medicare Annual Wellness Visit  02/05/2023   Flu Shot  10/09/2023   COVID-19 Vaccine (7 - 2025-26 season) 11/09/2023   DTaP/Tdap/Td vaccine (5 - Td or Tdap) 08/24/2032   Pneumococcal Vaccine for age over 26  Completed   DEXA scan (bone density measurement)  Completed   Zoster (Shingles) Vaccine  Completed   HPV Vaccine  Aged Out   Meningitis B Vaccine  Aged Out   Breast Cancer Screening  Discontinued       12/03/2023    9:56 AM  Advanced Directives  Does Patient Have a Medical Advance Directive? Yes  Type of Estate agent of [place];Living will  Copy of Healthcare Power of Attorney in Chart? Yes - validated most recent copy scanned in chart (See row information)   Advance Care Planning is important because it: Ensures you receive  medical care that aligns with your values, goals, and preferences. Provides guidance to your family and loved ones, reducing the emotional burden of decision-making during critical moments.  Vision: Annual vision screenings are recommended for early detection of glaucoma, cataracts, and diabetic retinopathy. These exams can also reveal signs of chronic conditions such as diabetes and high blood pressure.  Dental: Annual dental screenings help detect early signs of oral cancer, gum disease, and other conditions linked to overall health, including heart disease and diabetes.  Please see the attached documents for additional preventive care recommendations.

## 2023-12-03 NOTE — Progress Notes (Signed)
 Subjective:   Gabriela Smith is a 83 y.o. who presents for a Medicare Wellness preventive visit.  As a reminder, Annual Wellness Visits don't include a physical exam, and some assessments may be limited, especially if this visit is performed virtually. We may recommend an in-person follow-up visit with your provider if needed.  Visit Complete: Virtual I connected with  Nichole ONEIDA [place] on 12/03/23 by a audio enabled telemedicine application and verified that I am speaking with the correct person using two identifiers.  Patient Location: Home  Provider Location: Home Office  I discussed the limitations of evaluation and management by telemedicine. The patient expressed understanding and agreed to proceed.  Vital Signs: Because this visit was a virtual/telehealth visit, some criteria may be missing or patient reported. Any vitals not documented were not able to be obtained and vitals that have been documented are patient reported.  VideoDeclined- This patient declined Librarian, academic. Therefore the visit was completed with audio only.  Persons Participating in Visit: Patient.  AWV Questionnaire: No: Patient Medicare AWV questionnaire was not completed prior to this visit.  Cardiac Risk Factors include: advanced age (>83men, >8 women);obesity (BMI >30kg/m2);smoking/ tobacco exposure;hypertension     Objective:    Today's Vitals   12/03/23 1000  BP: 137/67  Pulse: 100  Weight: 173 lb (78.5 kg)  Height: 5' 3 (1.6 m)   Body mass index is 30.65 kg/m.     12/03/2023    9:56 AM 01/29/2023    9:09 AM 12/23/2022   11:24 AM 12/12/2022   12:29 PM 12/12/2022    9:32 AM 12/05/2020   10:00 AM 02/07/2020   12:50 PM  Advanced Directives  Does Patient Have a Medical Advance Directive? Yes Yes Yes Yes Yes Yes Yes  Type of Estate agent of [place];Living will Healthcare Power of [employer] Power of [employer] Power of  [place];Living will Healthcare Power of [place];Living will Healthcare Power of [place];Living will   Does patient want to make changes to medical advance directive?  No - Patient declined No - Patient declined No - Patient declined No - Patient declined    Copy of Healthcare Power of Attorney in Chart? Yes - validated most recent copy scanned in chart (See row information) No - copy requested No - copy requested No - copy requested No - copy requested Yes - validated most recent copy scanned in chart (See row information)     Current Medications (verified) Outpatient Encounter Medications as of 12/03/2023  Medication Sig   acetaminophen  (TYLENOL ) 500 MG tablet Take 2 tablets (1,000 mg total) by mouth every 8 (eight) hours. (Patient taking differently: Take 1,000 mg by mouth every 6 (six) hours as needed for moderate pain (pain score 4-6).)   amLODipine  (NORVASC ) 5 MG tablet Take 1 tablet (5 mg total) by mouth daily.   apixaban  (ELIQUIS ) 5 MG TABS tablet Take 1 tablet (5 mg total) by mouth 2 (two) times daily.   aspirin  EC 81 MG EC tablet Take 1 tablet (81 mg total) by mouth daily.   cholecalciferol  (VITAMIN D ) 1000 UNITS tablet Take 1,000 Units by mouth daily after breakfast.    digoxin  (LANOXIN ) 0.125 MG tablet TAKE ONE TABLET BY MOUTH DAILY   donepezil  (ARICEPT ) 10 MG tablet Take 1 tablet (10 mg total) by mouth daily.   Flaxseed, Linseed, (FLAX SEED OIL) 1000 MG CAPS Take 1,000 mg by mouth daily after breakfast.    furosemide  (LASIX ) 40 MG tablet  TAKE ONE TABLET BY MOUTH EVERY DAY   GNP VITAMIN B-12 500 MCG tablet TAKE 1 TABLET DAILY   hydrocortisone  1 % lotion Apply 1 application. topically 2 (two) times daily. (Patient taking differently: Apply 1 application  topically 2 (two) times daily as needed for itching (Dermatitis).)   levothyroxine  (SYNTHROID ) 50 MCG tablet Take 1 tablet (50 mcg total) by mouth daily.   Magnesium  300 MG CAPS Take 300 mg by mouth at bedtime.    metoprolol  tartrate  (LOPRESSOR ) 100 MG tablet Take 1 tablet (100 mg total) by mouth 2 (two) times daily.   nitroGLYCERIN  (NITROSTAT ) 0.4 MG SL tablet PLACE 1 TABLET UNDER THE TONGUE AT ONSET OF CHEST PAIN EVERY 5 MINTUES UP TO 3 TIMES AS NEEDED   REPATHA  SURECLICK 140 MG/ML SOAJ INJECT 140mg  (1ml) EVERY TWO WEEKS AS DIRECTED   triamcinolone  (KENALOG ) 0.025 % ointment Apply 1 Application topically 2 (two) times daily.   TURMERIC PO Take 1 capsule by mouth daily after breakfast.   Co-Enzyme Q10 200 MG CAPS Take 200 mg by mouth daily after breakfast.  (Patient not taking: Reported on 12/03/2023)   potassium chloride  SA (KLOR-CON  M) 20 MEQ tablet Take 1 tablet (20 mEq total) by mouth daily. (Patient not taking: Reported on 12/03/2023)   Facility-Administered Encounter Medications as of 12/03/2023  Medication   DOBUTamine  (DOBUTREX ) 1,000 mcg/mL in dextrose  5% 250 mL infusion    Allergies (verified) Fish allergy, Fish-derived products, Shellfish-derived products, Lasix  [furosemide ], Codeine, Latex, Macrodantin [nitrofurantoin macrocrystal], Niacin, Niaspan [niacin er (antihyperlipidemic)], Statins, Sulfa antibiotics, Vicodin [hydrocodone -acetaminophen ], and Zetia  [ezetimibe ]   History: Past Medical History:  Diagnosis Date   AAA (abdominal aortic aneurysm) without rupture 08/24/2015   Acute CHF (congestive heart failure) 04/15/2019   Acute heart failure with preserved ejection fraction (HFpEF) 12/12/2022   Acute respiratory failure with hypoxia 12/12/2022   Aortic atherosclerosis 02/11/2017   Arthritis    OA lt knee- cortizone inj. q 4 months AND OA ALSO IN BACK. Had knee replaced-issue resolved   Atrophic vaginitis 09/18/2016   CAD (coronary artery disease)    1993-- PTCA. Pt describes a near total blockage of apparently the LAD and a 65% stenosis elsewhere.   Chronic venous insufficiency 01/06/2023   Coronary atherosclerosis of native coronary artery 11/28/2008   Degenerative disc disease, lumbar 02/08/2013    Edema 07/03/2020   Essential hypertension 12/12/2022   History of bladder cancer    hx  TCC of bladder ,  Ta G3-  first occurence 02-20-2012--  s/p BCG tx's  and  mitomycin  C   Hyperlipidemia LDL goal <70 11/28/2008   Hypokalemia 12/12/2022   Intermittent claudication 02/17/2019   Malignant neoplasm of urinary bladder 11/04/2012   Mild cognitive impairment with memory loss 07/20/2023   Nocturia    Osteoarthritis of left knee 11/04/2012   Osteopenia with high risk of fracture 04/20/2013   PAD (peripheral artery disease) 01/06/2023   Paroxysmal atrial fibrillation 08/12/2019   Peripheral neuropathy 03/07/2019   Peripheral vascular insufficiency 02/17/2019   S/P CABG x 4 04/26/2019   S/P left TKA 11/22/2012   Sigmoid diverticulosis    Status post hip replacement, right 12/25/2015   Vitamin D  deficiency 02/08/2013   Past Surgical History:  Procedure Laterality Date   CHOLECYSTECTOMY  2003 (approx)   CLIPPING OF ATRIAL APPENDAGE N/A 04/25/2019   Procedure: Clipping Of Atrial Appendage;  Surgeon: Shyrl Linnie KIDD, MD;  Location: [facility] OR;  Service: Open Heart Surgery;  Laterality: N/A;   COLONOSCOPY  05-31-2003  CORONARY ANGIOPLASTY  1993   to LAD   CORONARY ARTERY BYPASS GRAFT N/A 04/25/2019   Procedure: CORONARY ARTERY BYPASS GRAFTING (CABG) times four using left internal mammary artery and left greater saphenous vein harvested endoscipically.;  Surgeon: Shyrl Linnie KIDD, MD;  Location: [facility] OR;  Service: Open Heart Surgery;  Laterality: N/A;   CYSTOSCOPY WITH BIOPSY  05/09/2011   Procedure: CYSTOSCOPY WITH BIOPSY;  Surgeon: Oneil JAYSON Rafter, MD;  Location[facility];  Service: Urology;  Laterality: N/A;  WITH BLADDER BIOPSY GYRUS   LEFT HEART CATH AND CORONARY ANGIOGRAPHY N/A 04/19/2019   Procedure: LEFT HEART CATH AND CORONARY ANGIOGRAPHY;  Surgeon: Anner Alm ORN, MD;  Location: [facility] INVASIVE CV LAB;  Service: Cardiovascular;  Laterality: N/A;   TOTAL HIP ARTHROPLASTY  Right 12/25/2015   Procedure: RIGHT TOTAL HIP ARTHROPLASTY ANTERIOR APPROACH;  Surgeon: Donnice Car, MD;  Location: [facility];  Service: Orthopedics;  Laterality: Right;   TOTAL KNEE ARTHROPLASTY Left 11/22/2012   Procedure: LEFT TOTAL KNEE ARTHROPLASTY;  Surgeon: Donnice JONETTA Car, MD;  Location: [facility];  Service: Orthopedics;  Laterality: Left;   TRANSURETHRAL RESECTION OF BLADDER TUMOR  03/14/2011   Procedure: TRANSURETHRAL RESECTION OF BLADDER TUMOR (TURBT);  Surgeon: Mark C Ottelin, MD;  Location[facility];  Service: Urology;  Laterality: N/A;   TRANSURETHRAL RESECTION OF BLADDER TUMOR  05/09/2011   Procedure: TRANSURETHRAL RESECTION OF BLADDER TUMOR (TURBT);  Surgeon: Mark C Ottelin, MD;  Location: [facility];  Service: Urology;  Laterality: N/A;   TRANSURETHRAL RESECTION OF BLADDER TUMOR  03/08/2012   Procedure: TRANSURETHRAL RESECTION OF BLADDER TUMOR (TURBT);  Surgeon: Mark C Ottelin, MD;  Location[facility];  Service: Urology;  Laterality: N/A;   TRANSURETHRAL RESECTION OF BLADDER TUMOR WITH GYRUS (TURBT-GYRUS) N/A 03/24/2014   Procedure: TRANSURETHRAL RESECTION OF BLADDER TUMOR WITH GYRUS (TURBT-GYRUS);  Surgeon: Mark C Ottelin, MD;  Location: [facility];  Service: Urology;  Laterality: N/A;   UMBILICAL HERNIA REPAIR  2001  (approx)   Family History  Problem Relation Age of Onset   Coronary artery disease Father 71   Hyperlipidemia Father    Heart attack Mother 73   Hyperlipidemia Mother    Osteoporosis Sister    Hyperlipidemia Sister    Heart attack Daughter 78       CABG x 5   Heart disease Son 33       Stents x 3   Social History   Socioeconomic History   Marital status: Widowed    Spouse name: Lynwood   Number of children: 3   Years of education: 12   Highest education level: High school graduate  Occupational History   Occupation: retired  Tobacco Use   Smoking status: Former    Current packs/day: 0.00     Average packs/day: 0.5 packs/day for 5.0 years (2.5 ttl pk-yrs)    Types: Cigarettes    Start date: 03/10/1971    Quit date: 03/09/1976    Years since quitting: 47.7   Smokeless tobacco: Never  Vaping Use   Vaping status: Never Used  Substance and Sexual Activity   Alcohol use: Not Currently    Comment: rare glass of wine   Drug use: No   Sexual activity: Not Currently  Other Topics Concern   Not on file  Social History Narrative   Married with 3 children and 4 grandchildren.  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 with Parkinsons   Lives in garage apartment at  her daughters home.   Right handed   Drinks caffeine prn   retired   Chief Executive Officer Drivers of [employer]: Low Risk  (12/03/2023)   Overall Financial Resource Strain (CARDIA)    Difficulty of Paying Living Expenses: Not hard at all  Food Insecurity: No Food Insecurity (12/03/2023)   Hunger Vital Sign    Worried About Running Out of Food in the Last Year: Never true    Ran Out of Food in the Last Year: Never true  Transportation Needs: No Transportation Needs (12/03/2023)   PRAPARE - Administrator, Civil Service (Medical): No    Lack of Transportation (Non-Medical): No  Physical Activity: Inactive (12/03/2023)   Exercise Vital Sign    Days of Exercise per Week: 0 days    Minutes of Exercise per Session: 0 min  Stress: No Stress Concern Present (12/03/2023)   [employer] of Occupational Health - Occupational Stress Questionnaire    Feeling of Stress: Not at all  Social Connections: Moderately Isolated (12/03/2023)   Social Connection and Isolation Panel    Frequency of Communication with Friends and Family: More than three times a week    Frequency of Social Gatherings with Friends and Family: More than three times a week    Attends Religious Services: 1 to 4 times per year    Active Member of [employer] or Organizations: No    Attends Banker Meetings: Never    Marital Status: Widowed     Tobacco Counseling Counseling given: Yes    Clinical Intake:  Pre-visit preparation completed: Yes  Pain : No/denies pain     BMI - recorded: 30.65 Nutritional Status: BMI > 30  Obese Nutritional Risks: None Diabetes: No  Lab Results  Component Value Date   HGBA1C 6.3 (H) 08/25/2022   HGBA1C 5.7 (H) 04/24/2019   HGBA1C 5.7 (H) 04/15/2019     How often do you need to have someone help you when you read instructions, pamphlets, or other written materials from your doctor or pharmacy?: 1 - Never  Interpreter Needed?: No  Comments: assisted w/pt's daughter-Amy Information entered by :: alia t/cma   Activities of Daily Living     12/03/2023    9:51 AM 12/12/2022   12:29 PM  In your present state of health, do you have any difficulty performing the following activities:  Hearing? 1 0  Vision? 0 0  Difficulty concentrating or making decisions? 1 0  Walking or climbing stairs? 0   Dressing or bathing? 0   Doing errands, shopping? 1 0  Comment pt's daughter, Amy   Preparing Food and eating ? N   Using the Toilet? N   In the past six months, have you accidently leaked urine? N   Do you have problems with loss of bowel control? N   Managing your Medications? Y   Comment pt's daughter, Amy   Managing your Finances? Y   Comment pt's daughter, Amy   Housekeeping or managing your Housekeeping? N     Patient Care Team: [name], DO as PCP - General (Family Medicine) Lavona Agent, MD as PCP - Cardiology (Cardiology) Ottelin, Mark, MD (Inactive) as Consulting Physician (Urology) Charmayne Molly, MD as Consulting Physician (Ophthalmology) Shyrl Linnie KIDD, MD as Consulting Physician (Cardiothoracic Surgery)  I have updated your Care Teams any recent Medical Services you may have received from other providers in the past year.     Assessment:   This is  a routine wellness examination for [place].  Hearing/Vision screen Hearing Screening - Comments::  Pt have hearing aids Vision Screening - Comments:: Pt wear readers/[facility] in [place]/last ov a yr ago   Goals Addressed   None    Depression Screen     12/03/2023    9:56 AM 10/06/2023    2:14 PM 03/06/2023    3:07 PM 09/17/2022   11:57 AM 09/02/2022    9:34 AM 08/25/2022   11:32 AM 01/03/2022    8:21 AM  PHQ 2/9 Scores  PHQ - 2 Score 0 0 0 0 0 0 0  PHQ- 9 Score 0 0  0 0 0 0    Fall Risk     12/03/2023    9:48 AM 10/06/2023    2:14 PM 03/06/2023    3:08 PM 01/29/2023    9:08 AM 12/23/2022    9:57 AM  Fall Risk   Falls in the past year? 0 0 0 0 0  Number falls in past yr: 0 0 0 0 0  Injury with Fall? 0 0 0 0 0  Risk for fall due to : No Fall Risks No Fall Risks No Fall Risks    Follow up Falls evaluation completed Falls evaluation completed Falls evaluation completed Falls evaluation completed Falls evaluation completed    MEDICARE RISK AT HOME:  Medicare Risk at Home Any stairs in or around the home?: Yes If so, are there any without handrails?: Yes Home free of loose throw rugs in walkways, pet beds, electrical cords, etc?: Yes Adequate lighting in your home to reduce risk of falls?: Yes Life alert?: No Use of a cane, walker or w/c?: No Grab bars in the bathroom?: Yes Shower chair or bench in shower?: Yes Elevated toilet seat or a handicapped toilet?: Yes  TIMED UP AND GO:  Was the test performed?  no  Cognitive Function: 6CIT was not performed per pt's daughter, pt would not be able to do test due have some memory loss.    08/25/2022   11:52 AM 12/01/2017    8:17 AM  MMSE - Mini Mental State Exam  Orientation to time 4 5  Orientation to Place 5 5  Registration 2 3  Attention/ Calculation 5 5  Recall 3 3  Language- name 2 objects 2 2  Language- repeat 1 1  Language- follow 3 step command 3 3  Language- read & follow direction 1 1  Write a sentence 1 1  Copy design 1 1  Total score 28 30      12/23/2022   12:00 PM  Montreal Cognitive Assessment    Visuospatial/ Executive (0/5) 2  Naming (0/3) 3  Attention: Read list of digits (0/2) 2  Attention: Read list of letters (0/1) 1  Attention: Serial 7 subtraction starting at 100 (0/3) 2  Language: Repeat phrase (0/2) 1  Language : Fluency (0/1) 1  Abstraction (0/2) 1  Delayed Recall (0/5) 0  Orientation (0/6) 6  Total 19  Adjusted Score (based on education) 20      02/04/2022    9:04 AM 12/05/2019    9:59 AM 12/02/2018   10:14 AM  6CIT Screen  What Year? 0 points 0 points 0 points  What month? 0 points 0 points 0 points  What time? 0 points 0 points 0 points  Count back from 20 0 points 0 points 0 points  Months in reverse 0 points 0 points 0 points  Repeat phrase 0 points 0  points 2 points  Total Score 0 points 0 points 2 points    Immunizations Immunization History  Administered Date(s) Administered   Fluad Quad(high Dose 65+) 11/17/2018, 12/09/2019, 12/11/2020, 12/09/2021   INFLUENZA, HIGH DOSE SEASONAL PF 12/07/2015, 12/16/2016, 12/21/2017   Influenza, Seasonal, Injecte, Preservative Fre 01/15/2009, 12/13/2009, 12/18/2010, 01/07/2012   Influenza,inj,Quad PF,6+ Mos 12/21/2012, 01/04/2014, 12/25/2014   Influenza-Unspecified 01/06/1997   Moderna Covid-19 Vaccine Bivalent Booster 63yrs & up 12/11/2020   Novel Infuenza-h1n1-09 05/03/2008   PFIZER Comirnaty(Gray Top)Covid-19 Tri-Sucrose Vaccine 08/02/2020   PFIZER(Purple Top)SARS-COV-2 Vaccination 03/30/2019, 06/25/2019, 11/29/2019, 08/02/2020   Pneumococcal Conjugate,unspecified 06/28/2014   Pneumococcal Conjugate-13 06/28/2014   Pneumococcal Polysaccharide-23 11/04/2012   Tdap 07/01/2010, 10/09/2010, 11/06/2010, 08/25/2022   Zoster Recombinant(Shingrix) 03/28/2021, 05/27/2021   Zoster, Live 07/15/2006    Screening Tests Health Maintenance  Topic Date Due   Influenza Vaccine  10/09/2023   COVID-19 Vaccine (7 - 2025-26 season) 11/09/2023   Medicare Annual Wellness (AWV)  12/02/2024   DTaP/Tdap/Td (5 - Td or Tdap)  08/24/2032   Pneumococcal Vaccine: 50+ Years  Completed   DEXA SCAN  Completed   Zoster Vaccines- Shingrix  Completed   HPV VACCINES  Aged Out   Meningococcal B Vaccine  Aged Out   Mammogram  Discontinued    Health Maintenance Items Addressed: See Nurse Notes at the end of this note  Additional Screening:  Vision Screening: Recommended annual ophthalmology exams for early detection of glaucoma and other disorders of the eye. Is the patient up to date with their annual eye exam?  Yes  Who is the provider or what is the name of the office in which the patient attends annual eye exams? [facility] in [place], [place]  Dental Screening: Recommended annual dental exams for proper oral hygiene  Community Resource Referral / Chronic Care Management: CRR required this visit?  No   CCM required this visit?  No   Plan:    I have personally reviewed and noted the following in the patient's chart:   Medical and social history Use of alcohol, tobacco or illicit drugs  Current medications and supplements including opioid prescriptions. Patient is not currently taking opioid prescriptions. Functional ability and status Nutritional status Physical activity Advanced directives List of other physicians Hospitalizations, surgeries, and ER visits in previous 12 months Vitals Screenings to include cognitive, depression, and falls Referrals and appointments  In addition, I have reviewed and discussed with patient certain preventive protocols, quality metrics, and best practice recommendations. A written personalized care plan for preventive services as well as general preventive health recommendations were provided to patient.   Ozie Ned, CMA   12/03/2023   After Visit Summary: (MyChart) Due to this being a telephonic visit, the after visit summary with patients personalized plan was offered to patient via MyChart   Notes: Nothing significant to report at this time.   I have reviewed and  agree with the above AWV documentation.   Mary-Margaret Gladis, FNP

## 2023-12-14 DIAGNOSIS — L281 Prurigo nodularis: Secondary | ICD-10-CM | POA: Diagnosis not present

## 2023-12-14 DIAGNOSIS — Z85828 Personal history of other malignant neoplasm of skin: Secondary | ICD-10-CM | POA: Diagnosis not present

## 2023-12-14 DIAGNOSIS — Z08 Encounter for follow-up examination after completed treatment for malignant neoplasm: Secondary | ICD-10-CM | POA: Diagnosis not present

## 2023-12-18 ENCOUNTER — Other Ambulatory Visit: Payer: Self-pay | Admitting: Family Medicine

## 2023-12-18 DIAGNOSIS — E039 Hypothyroidism, unspecified: Secondary | ICD-10-CM

## 2023-12-21 ENCOUNTER — Encounter: Payer: Self-pay | Admitting: Physician Assistant

## 2023-12-21 ENCOUNTER — Ambulatory Visit: Admitting: Physician Assistant

## 2023-12-21 VITALS — BP 158/93 | HR 84 | Resp 20 | Wt 173.0 lb

## 2023-12-21 DIAGNOSIS — G3184 Mild cognitive impairment, so stated: Secondary | ICD-10-CM | POA: Diagnosis not present

## 2023-12-21 NOTE — Progress Notes (Signed)
 Assessment/Plan:    Mild cognitive impairment with memory loss, concern for Alzheimer's disease and vascular  Gabriela Smith is a very pleasant 83 y.o. RH female with a history of  hypertension, hyperlipidemia, hypothyroidism, CAD status post CABG, AAA, chronic atrial fibrillation on apixaban , history of aortic stenosis, recent acute respiratory failure with hypoxia secondary to pulmonary edema on October 2024, and a diagnosis of MCI with memory loss as per neuropsych evaluation 07/2023, presenting today in follow-up for evaluation of memory loss. Patient is on donepezil  10 mg daily. Memory is stable, mood is controlled. Patient is able to participate on ADLs, no longer drives.     Recommendations:   Follow up in 6  months. Repeat neuropsych evaluation in 7-11 months for diagnostic clarity and disease trajectory  Continue donepezil  10 mg daily, side effects discussed Recommend good control of cardiovascular risk factors Continue to control mood as per PCP    Subjective:   This patient is accompanied in the office by her daughter  who supplements the history. Previous records as well as any outside records available were reviewed prior to todays visit.   Patient was last seen on 01/29/23 , MoCA 20/30 .    Any changes in memory since last visit? About the same. Patient has some difficulty remembering recent conversations and names of people.  Does not like to do crossword puzzles and word finding. Sees her friends, watches TV, reads frequently repeats oneself?  Endorsed, routinely, we know what the questions are going to be Disoriented when walking into a room?  Patient denies    Misplacing objects?  Patient denies   Wandering behavior?   Denies. Any personality changes since last visit? Denies.  Recently has been picking her R arm for a while.  Any worsening depression?: denies.   Hallucinations or paranoia?  Denies.   Seizures?   Denies.    Any sleep changes? Sleeps well.   Denies vivid dreams, REM behavior or sleepwalking   Sleep apnea?   denies    Any hygiene concerns?   Denies.   Independent of bathing and dressing?  Endorsed  Does the patient needs help with medications? Daughter is in charge   Who is in charge of the finances?  Daughter is in charge     Any changes in appetite?  Denies, does not drink enough water .      Patient have trouble swallowing?  Denies.   Does the patient cook?  Yes . Any kitchen accidents such as leaving the stove on? Denies.   Any headaches?    Denies.   Vision changes? Denies. Chronic pain?  Denies.   Ambulates with difficulty?   She has neuropathy, at times affects mobility.    Recent falls or head injuries?    Denies.      Unilateral weakness, numbness or tingling?  Denies.   Any tremors?  Denies.   Any anosmia?    Denies.   Any incontinence of urine? urgency Any bowel dysfunction?  Denies.      Patient lives with daughter in a garage apartment.  Does the patient drive? No longer drives     Initial visit 12/23/22 How long did patient have memory difficulties?  For about 4 years, worse over the last 6 months since her husband's death.  Patient reports some difficulty remembering new information, recent conversations, names. Needs to write a note for reminder, and has to write appointments in calendar. Likes to do crossword puzzles and  word finding. repeats  oneself? Endorsed to get the answers straight in my head. Disoriented when walking into a room?  Patient denies    Leaving objects in unusual places?  Denies.   Wandering behavior? denies   Any personality changes, or depression, anxiety? She is dealing with situational depression after the recent death of her husband of 65 years. I am doing better, she says   Hallucinations or paranoia? denies   Seizures? denies    Any sleep changes?  Sleeps well , denies dreams, REM behavior or sleepwalking   Sleep apnea? Denies.   Any hygiene concerns?  Denies.   Independent  of bathing and dressing? Endorsed  Does the patient need help with medications? Daughter  is in charge   Who is in charge of the finances? Daughter  is in charge     Any changes in appetite?   Denies.     Patient have trouble swallowing?  Denies.   Does the patient cook? I am used to cooking for more people, it is hard to just cook for 1 .  Any headaches?  Denies.   Chronic pain? Denies.   Ambulates with difficulty? Denies.   Recent falls or head injuries? Denies.     Vision changes?  Denies any new issues.  Has a history of cataracts  Any strokelike symptoms? Denies.   Any tremors? Denies.   Any anosmia? Denies.   Any incontinence of urine? Urge due to fluid pill Any bowel dysfunction? Denies.      Patient lives with daughter in a garage apartment  History of heavy alcohol intake? Denies.   History of heavy tobacco use? Denies.   Family history of dementia?  Her sister and father had AD dementia  Does patient drive?Yes, only locally, denies getting lost    Neurocognitive testing, 07/20/23 Dr. Richie Briefly, results suggested impairment surrounding delayed retrieval aspects of memory. Further performance variability was exhibited across recognition/consolidation aspects of memory. A relative weakness was also exhibited across semantic fluency. The cause for ongoing memory dysfunction remains uncertain. Despite being able to learn novel information quite well, retention rates after brief delays ranged from 0% to 17%. She also consistently performed poorly across verbal recognition trials. Taken together, this suggests evidence for rapid forgetting and an evolving and already fairly notable storage impairment. This pattern of memory performance can be characteristic of Alzheimer's disease and this illness being present in very early stages remains plausible. Her further weakness surrounding semantic fluency would follow typical disease progression. Her most recent neuroimaging suggested  moderate to severe chronic microvascular disease. There remains the possibility of a vascular contribution to impairment given these findings. However, intact learning and amnestic memory performances do not align with cerebrovascular disease expectations particularly well.     MRI of the brain results performed on 01/2023 were personally reviewed, remarkable for moderate to severe chronic SVD within the cerebral white matter, no evidence of lobar predominant atrophy. An incidental 1 cm within the R cerebellum noted, of no apparent significance (asymptomatic).   Past Medical History:  Diagnosis Date   AAA (abdominal aortic aneurysm) without rupture 08/24/2015   Acute CHF (congestive heart failure) 04/15/2019   Acute heart failure with preserved ejection fraction (HFpEF) 12/12/2022   Acute respiratory failure with hypoxia 12/12/2022   Aortic atherosclerosis 02/11/2017   Arthritis    OA lt knee- cortizone inj. q 4 months AND OA ALSO IN BACK. Had knee replaced-issue resolved   Atrophic vaginitis 09/18/2016   CAD (coronary artery disease)  1993-- PTCA. Pt describes a near total blockage of apparently the LAD and a 65% stenosis elsewhere.   Chronic venous insufficiency 01/06/2023   Coronary atherosclerosis of native coronary artery 11/28/2008   Degenerative disc disease, lumbar 02/08/2013   Edema 07/03/2020   Essential hypertension 12/12/2022   History of bladder cancer    hx  TCC of bladder ,  Ta G3-  first occurence 02-20-2012--  s/p BCG tx's  and  mitomycin  C   Hyperlipidemia LDL goal <70 11/28/2008   Hypokalemia 12/12/2022   Intermittent claudication 02/17/2019   Malignant neoplasm of urinary bladder 11/04/2012   Mild cognitive impairment with memory loss 07/20/2023   Nocturia    Osteoarthritis of left knee 11/04/2012   Osteopenia with high risk of fracture 04/20/2013   PAD (peripheral artery disease) 01/06/2023   Paroxysmal atrial fibrillation 08/12/2019   Peripheral neuropathy  03/07/2019   Peripheral vascular insufficiency 02/17/2019   S/P CABG x 4 04/26/2019   S/P left TKA 11/22/2012   Sigmoid diverticulosis    Status post hip replacement, right 12/25/2015   Vitamin D  deficiency 02/08/2013     Past Surgical History:  Procedure Laterality Date   CHOLECYSTECTOMY  2003 (approx)   CLIPPING OF ATRIAL APPENDAGE N/A 04/25/2019   Procedure: Clipping Of Atrial Appendage;  Surgeon: Shyrl Linnie KIDD, MD;  Location: [facility] OR;  Service: Open Heart Surgery;  Laterality: N/A;   COLONOSCOPY  05-31-2003   CORONARY ANGIOPLASTY  1993   to LAD   CORONARY ARTERY BYPASS GRAFT N/A 04/25/2019   Procedure: CORONARY ARTERY BYPASS GRAFTING (CABG) times four using left internal mammary artery and left greater saphenous vein harvested endoscipically.;  Surgeon: Shyrl Linnie KIDD, MD;  Location: [facility] OR;  Service: Open Heart Surgery;  Laterality: N/A;   CYSTOSCOPY WITH BIOPSY  05/09/2011   Procedure: CYSTOSCOPY WITH BIOPSY;  Surgeon: Oneil JAYSON Rafter, MD;  Location: [facility];  Service: Urology;  Laterality: N/A;  WITH BLADDER BIOPSY GYRUS   LEFT HEART CATH AND CORONARY ANGIOGRAPHY N/A 04/19/2019   Procedure: LEFT HEART CATH AND CORONARY ANGIOGRAPHY;  Surgeon: Anner Alm ORN, MD;  Location: [facility] INVASIVE CV LAB;  Service: Cardiovascular;  Laterality: N/A;   TOTAL HIP ARTHROPLASTY Right 12/25/2015   Procedure: RIGHT TOTAL HIP ARTHROPLASTY ANTERIOR APPROACH;  Surgeon: Donnice Car, MD;  Location: [facility];  Service: Orthopedics;  Laterality: Right;   TOTAL KNEE ARTHROPLASTY Left 11/22/2012   Procedure: LEFT TOTAL KNEE ARTHROPLASTY;  Surgeon: Donnice JONETTA Car, MD;  Location: [facility];  Service: Orthopedics;  Laterality: Left;   TRANSURETHRAL RESECTION OF BLADDER TUMOR  03/14/2011   Procedure: TRANSURETHRAL RESECTION OF BLADDER TUMOR (TURBT);  Surgeon: Mark C Ottelin, MD;  Location: [facility];  Service: Urology;  Laterality: N/A;   TRANSURETHRAL RESECTION OF BLADDER TUMOR   05/09/2011   Procedure: TRANSURETHRAL RESECTION OF BLADDER TUMOR (TURBT);  Surgeon: Mark C Ottelin, MD;  Location: [facility];  Service: Urology;  Laterality: N/A;   TRANSURETHRAL RESECTION OF BLADDER TUMOR  03/08/2012   Procedure: TRANSURETHRAL RESECTION OF BLADDER TUMOR (TURBT);  Surgeon: Mark C Ottelin, MD;  Location: [facility];  Service: Urology;  Laterality: N/A;   TRANSURETHRAL RESECTION OF BLADDER TUMOR WITH GYRUS (TURBT-GYRUS) N/A 03/24/2014   Procedure: TRANSURETHRAL RESECTION OF BLADDER TUMOR WITH GYRUS (TURBT-GYRUS);  Surgeon: Mark C Ottelin, MD;  Location: [facility];  Service: Urology;  Laterality: N/A;   UMBILICAL HERNIA REPAIR  2001  (approx)     PREVIOUS  MEDICATIONS:   CURRENT MEDICATIONS:  Outpatient Encounter Medications as of 12/21/2023  Medication Sig   acetaminophen  (TYLENOL ) 500 MG tablet Take 2 tablets (1,000 mg total) by mouth every 8 (eight) hours.   amLODipine  (NORVASC ) 5 MG tablet Take 1 tablet (5 mg total) by mouth daily.   apixaban  (ELIQUIS ) 5 MG TABS tablet Take 1 tablet (5 mg total) by mouth 2 (two) times daily.   aspirin  EC 81 MG EC tablet Take 1 tablet (81 mg total) by mouth daily.   cholecalciferol  (VITAMIN D ) 1000 UNITS tablet Take 1,000 Units by mouth daily after breakfast.    digoxin  (LANOXIN ) 0.125 MG tablet TAKE ONE TABLET BY MOUTH DAILY   donepezil  (ARICEPT ) 10 MG tablet Take 1 tablet (10 mg total) by mouth daily.   Flaxseed, Linseed, (FLAX SEED OIL) 1000 MG CAPS Take 1,000 mg by mouth daily after breakfast.    furosemide  (LASIX ) 40 MG tablet TAKE ONE TABLET BY MOUTH EVERY DAY   GNP VITAMIN B-12 500 MCG tablet TAKE 1 TABLET DAILY   hydrocortisone  1 % lotion Apply 1 application. topically 2 (two) times daily.   levothyroxine  (SYNTHROID ) 50 MCG tablet Take 1 tablet (50 mcg total) by mouth daily.   Magnesium  300 MG CAPS Take 300 mg by mouth at bedtime.    metoprolol  tartrate (LOPRESSOR ) 100 MG tablet Take 1  tablet (100 mg total) by mouth 2 (two) times daily.   nitroGLYCERIN  (NITROSTAT ) 0.4 MG SL tablet PLACE 1 TABLET UNDER THE TONGUE AT ONSET OF CHEST PAIN EVERY 5 MINTUES UP TO 3 TIMES AS NEEDED   REPATHA  SURECLICK 140 MG/ML SOAJ INJECT 140mg  (1ml) EVERY TWO WEEKS AS DIRECTED   triamcinolone  (KENALOG ) 0.025 % ointment Apply 1 Application topically 2 (two) times daily.   TURMERIC PO Take 1 capsule by mouth daily after breakfast.   Co-Enzyme Q10 200 MG CAPS Take 200 mg by mouth daily after breakfast.  (Patient not taking: Reported on 12/21/2023)   potassium chloride  SA (KLOR-CON  M) 20 MEQ tablet Take 1 tablet (20 mEq total) by mouth daily. (Patient not taking: Reported on 12/21/2023)   Facility-Administered Encounter Medications as of 12/21/2023  Medication   DOBUTamine  (DOBUTREX ) 1,000 mcg/mL in dextrose  5% 250 mL infusion     Objective:     PHYSICAL EXAMINATION:    VITALS:   Vitals:   12/21/23 1501  BP: (!) 158/93  Pulse: 84  Resp: 20  SpO2: 95%  Weight: 173 lb (78.5 kg)    GEN:  The patient appears stated age and is in NAD. HEENT:  Normocephalic, atraumatic.   Neurological examination:  General: NAD, well-groomed, appears stated age. Orientation: The patient is alert. Oriented to person, place and not to date.  Cranial nerves: There is good facial symmetry. The speech is fluent and clear. No aphasia or dysarthria. Fund of knowledge is appropriate. Recent and remote memory impaired. Attention and concentration are reduced  Able to name objects and repeat phrases.  Hearing is intact to conversational tone.   Sensation: Sensation is intact to light touch throughout Motor: Strength is at least antigravity x4. DTR's 2/4 in UE/LE      12/23/2022   12:00 PM  Montreal Cognitive Assessment   Visuospatial/ Executive (0/5) 2  Naming (0/3) 3  Attention: Read list of digits (0/2) 2  Attention: Read list of letters (0/1) 1  Attention: Serial 7 subtraction starting at 100 (0/3) 2   Language: Repeat phrase (0/2) 1  Language : Fluency (0/1) 1  Abstraction (0/2)  1  Delayed Recall (0/5) 0  Orientation (0/6) 6  Total 19  Adjusted Score (based on education) 20       08/25/2022   11:52 AM 12/01/2017    8:17 AM  MMSE - Mini Mental State Exam  Orientation to time 4 5  Orientation to Place 5 5  Registration 2 3  Attention/ Calculation 5 5  Recall 3 3  Language- name 2 objects 2 2  Language- repeat 1 1  Language- follow 3 step command 3 3  Language- read & follow direction 1 1  Write a sentence 1 1  Copy design 1 1  Total score 28 30       Movement examination: Tone: There is normal tone in the UE/LE Abnormal movements:  no tremor.  No myoclonus.  No asterixis.   Coordination:  There is no decremation with RAM's. Normal finger to nose  Gait and Station: The patient has no difficulty arising out of a deep-seated chair without the use of the hands. The patient's stride length is good.  Gait is cautious and narrow.   Thank you for allowing us  the opportunity to participate in the care of this nice patient. Please do not hesitate to contact us  for any questions or concerns.   Total time spent on today's visit was 30 minutes dedicated to this patient today, preparing to see patient, examining the patient, ordering tests and/or medications and counseling the patient, documenting clinical information in the EHR or other health record, independently interpreting results and communicating results to the patient/family, discussing treatment and goals, answering patient's questions and coordinating care.  Cc:  [name], DO  Camie Sevin 12/21/2023 5:19 PM

## 2023-12-21 NOTE — Patient Instructions (Addendum)
 It was a pleasure to see you today at our office.   Recommendations:  Continue donepezil  10 mg daily   [facility]  [address], [place] [address] [phone]  Hours of Operation Mondays to Thursdays: 8 am to 8 pm,Fridays: 9 am to 8 pm, Saturdays: 9 am to 1 pm Sundays: Closed  https://www.[place].gov/departments/parks-recreation/active-adults-50/[facility]   https://www.[employer].org/resource/neuro-rehabilitation-apps-and-games/   RECOMMENDATIONS FOR ALL PATIENTS WITH MEMORY PROBLEMS: 1. Continue to exercise (Recommend 30 minutes of walking everyday, or 3 hours every week) 2. Increase social interactions - continue going to [place] and enjoy social gatherings with friends and family 3. Eat healthy, avoid fried foods and eat more fruits and vegetables 4. Maintain adequate blood pressure, blood sugar, and blood cholesterol level. Reducing the risk of stroke and cardiovascular disease also helps promoting better memory. 5. Avoid stressful situations. Live a simple life and avoid aggravations. Organize your time and prepare for the next day in anticipation. 6. Sleep well, avoid any interruptions of sleep and avoid any distractions in the bedroom that may interfere with adequate sleep quality 7. Avoid sugar, avoid sweets as there is a strong link between excessive sugar intake, diabetes, and cognitive impairment We discussed the Mediterranean diet, which has been shown to help patients reduce the risk of progressive memory disorders and reduces cardiovascular risk. This includes eating fish, eat fruits and green leafy vegetables, nuts like almonds and hazelnuts, walnuts, and also use olive oil. Avoid fast foods and fried foods as much as possible. Avoid sweets and sugar as sugar use has been linked to worsening of memory function.  There is always a concern of gradual progression of memory problems. If this is the case, then we may need to  adjust level of care according to patient needs. Support, both to the patient and caregiver, should then be put into place.         DRIVING: Regarding driving, in patients with progressive memory problems, driving will be impaired. We advise to have someone else do the driving if trouble finding directions or if minor accidents are reported. Independent driving assessment is available to determine safety of driving.   If you are interested in the driving assessment, you can contact the following:  [facility] in [place] [phone]  [facility] [phone]  [facility] [phone]  [facility] [phone] or [phone]   FALL PRECAUTIONS: Be cautious when walking. Scan the area for obstacles that may increase the risk of trips and falls. When getting up in the mornings, sit up at the edge of the bed for a few minutes before getting out of bed. Consider elevating the bed at the head end to avoid drop of blood pressure when getting up. Walk always in a well-lit room (use night lights in the walls). Avoid area rugs or power cords from appliances in the middle of the walkways. Use a walker or a cane if necessary and consider physical therapy for balance exercise. Get your eyesight checked regularly.  FINANCIAL OVERSIGHT: Supervision, especially oversight when making financial decisions or transactions is also recommended.  HOME SAFETY: Consider the safety of the kitchen when operating appliances like stoves, microwave oven, and blender. Consider having supervision and share cooking responsibilities until no longer able to participate in those. Accidents with firearms and other hazards in the house should be identified and addressed as well.   ABILITY TO BE LEFT ALONE: If patient is unable to contact 911 operator, consider using LifeLine, or when the  need is there, arrange for someone to stay with patients. Smoking is a fire hazard,  consider supervision or cessation. Risk of wandering should be assessed by caregiver and if detected at any point, supervision and safe proof recommendations should be instituted.  MEDICATION SUPERVISION: Inability to self-administer medication needs to be constantly addressed. Implement a mechanism to ensure safe administration of the medications.      Mediterranean Diet A Mediterranean diet refers to food and lifestyle choices that are based on the traditions of countries located on the [employer]. This way of eating has been shown to help prevent certain conditions and improve outcomes for people who have chronic diseases, like kidney disease and heart disease. What are tips for following this plan? Lifestyle  Cook and eat meals together with your family, when possible. Drink enough fluid to keep your urine clear or pale yellow. Be physically active every day. This includes: Aerobic exercise like running or swimming. Leisure activities like gardening, walking, or housework. Get 7-8 hours of sleep each night. If recommended by your health care provider, drink red wine in moderation. This means 1 glass a day for nonpregnant women and 2 glasses a day for men. A glass of wine equals 5 oz (150 mL). Reading food labels  Check the serving size of packaged foods. For foods such as rice and pasta, the serving size refers to the amount of cooked product, not dry. Check the total fat in packaged foods. Avoid foods that have saturated fat or trans fats. Check the ingredients list for added sugars, such as corn syrup. Shopping  At the grocery store, buy most of your food from the areas near the walls of the store. This includes: Fresh fruits and vegetables (produce). Grains, beans, nuts, and seeds. Some of these may be available in unpackaged forms or large amounts (in bulk). Fresh seafood. Poultry and eggs. Low-fat dairy products. Buy whole ingredients instead of prepackaged foods. Buy  fresh fruits and vegetables in-season from local farmers markets. Buy frozen fruits and vegetables in resealable bags. If you do not have access to quality fresh seafood, buy precooked frozen shrimp or canned fish, such as tuna, salmon, or sardines. Buy small amounts of raw or cooked vegetables, salads, or olives from the deli or salad bar at your store. Stock your pantry so you always have certain foods on hand, such as olive oil, canned tuna, canned tomatoes, rice, pasta, and beans. Cooking  Cook foods with extra-virgin olive oil instead of using butter or other vegetable oils. Have meat as a side dish, and have vegetables or grains as your main dish. This means having meat in small portions or adding small amounts of meat to foods like pasta or stew. Use beans or vegetables instead of meat in common dishes like chili or lasagna. Experiment with different cooking methods. Try roasting or broiling vegetables instead of steaming or sauteing them. Add frozen vegetables to soups, stews, pasta, or rice. Add nuts or seeds for added healthy fat at each meal. You can add these to yogurt, salads, or vegetable dishes. Marinate fish or vegetables using olive oil, lemon juice, garlic, and fresh herbs. Meal planning  Plan to eat 1 vegetarian meal one day each week. Try to work up to 2 vegetarian meals, if possible. Eat seafood 2 or more times a week. Have healthy snacks readily available, such as: Vegetable sticks with hummus. Greek yogurt. Fruit and nut trail mix. Eat balanced meals throughout the week. This includes: Fruit: 2-3 servings  a day Vegetables: 4-5 servings a day Low-fat dairy: 2 servings a day Fish, poultry, or lean meat: 1 serving a day Beans and legumes: 2 or more servings a week Nuts and seeds: 1-2 servings a day Whole grains: 6-8 servings a day Extra-virgin olive oil: 3-4 servings a day Limit red meat and sweets to only a few servings a month What are my food  choices? Mediterranean diet Recommended Grains: Whole-grain pasta. Brown rice. Bulgar wheat. Polenta. Couscous. Whole-wheat bread. [place] Madeira. Vegetables: Artichokes. Beets. Broccoli. Cabbage. Carrots. Eggplant. Green beans. Chard. Kale. Spinach. Onions. Leeks. Peas. Squash. Tomatoes. Peppers. Radishes. Fruits: Apples. Apricots. Avocado. Berries. Bananas. Cherries. Dates. Figs. Grapes. Lemons. Melon. Oranges. Peaches. Plums. Pomegranate. Meats and other protein foods: Beans. Almonds. Sunflower seeds. Pine nuts. Peanuts. Cod. Salmon. Scallops. Shrimp. Tuna. Tilapia. Clams. Oysters. Eggs. Dairy: Low-fat milk. Cheese. Greek yogurt. Beverages: Water . Red wine. Herbal tea. Fats and oils: Extra virgin olive oil. Avocado oil. Grape seed oil. Sweets and desserts: [place] yogurt with honey. Baked apples. Poached pears. Trail mix. Seasoning and other foods: Basil. Cilantro. Coriander. Cumin. Mint. Parsley. Sage. Rosemary. Tarragon. Garlic. Oregano. Thyme. Pepper. Balsalmic vinegar. Tahini. Hummus. Tomato sauce. Olives. Mushrooms. Limit these Grains: Prepackaged pasta or rice dishes. Prepackaged cereal with added sugar. Vegetables: Deep fried potatoes (french fries). Fruits: Fruit canned in syrup. Meats and other protein foods: Beef. Pork. Lamb. Poultry with skin. Hot dogs. [place]. Dairy: Ice cream. Sour cream. Whole milk. Beverages: Juice. Sugar-sweetened soft drinks. Beer. Liquor and spirits. Fats and oils: Butter. Canola oil. Vegetable oil. Beef fat (tallow). Lard. Sweets and desserts: Cookies. Cakes. Pies. Candy. Seasoning and other foods: Mayonnaise. Premade sauces and marinades. The items listed may not be a complete list. Talk with your dietitian about what dietary choices are right for you. Summary The Mediterranean diet includes both food and lifestyle choices. Eat a variety of fresh fruits and vegetables, beans, nuts, seeds, and whole grains. Limit the amount of red meat and sweets that  you eat. Talk with your health care provider about whether it is safe for you to drink red wine in moderation. This means 1 glass a day for nonpregnant women and 2 glasses a day for men. A glass of wine equals 5 oz (150 mL). This information is not intended to replace advice given to you by your health care provider. Make sure you discuss any questions you have with your health care provider. Document Released: 10/18/2015 Document Revised: 11/20/2015 Document Reviewed: 10/18/2015 Elsevier Interactive Patient Education  2017 [employer].

## 2023-12-22 ENCOUNTER — Ambulatory Visit (HOSPITAL_COMMUNITY): Payer: Medicare Other

## 2023-12-23 ENCOUNTER — Other Ambulatory Visit: Payer: Self-pay | Admitting: Family Medicine

## 2023-12-23 DIAGNOSIS — E039 Hypothyroidism, unspecified: Secondary | ICD-10-CM

## 2024-01-01 ENCOUNTER — Other Ambulatory Visit: Payer: Self-pay | Admitting: Cardiology

## 2024-01-01 DIAGNOSIS — I480 Paroxysmal atrial fibrillation: Secondary | ICD-10-CM

## 2024-01-01 NOTE — Telephone Encounter (Signed)
 Eliquis  5mg  refill request received. Patient is 83 years old, weight-78.5kg, Crea-0.86 on 10/06/23, Diagnosis-Afi, and last seen by Dr. Lavona on 08/07/23. Dose is appropriate based on dosing criteria. Will send in refill to requested pharmacy.

## 2024-01-14 ENCOUNTER — Other Ambulatory Visit: Payer: Self-pay | Admitting: Physician Assistant

## 2024-01-25 ENCOUNTER — Other Ambulatory Visit (HOSPITAL_COMMUNITY): Payer: Self-pay | Admitting: Emergency Medicine

## 2024-01-25 ENCOUNTER — Ambulatory Visit (HOSPITAL_COMMUNITY)
Admission: RE | Admit: 2024-01-25 | Discharge: 2024-01-25 | Disposition: A | Discharge status: Home / Self Care | Source: Ambulatory Visit | Attending: Cardiology | Admitting: Cardiology

## 2024-01-25 DIAGNOSIS — I350 Nonrheumatic aortic (valve) stenosis: Secondary | ICD-10-CM | POA: Insufficient documentation

## 2024-01-25 LAB — ECHOCARDIOGRAM COMPLETE
AR max vel: 0.92 cm2
AV Area VTI: 0.88 cm2
AV Area mean vel: 0.84 cm2
AV Mean grad: 23 mmHg
AV Peak grad: 30.5 mmHg
Ao pk vel: 2.76 m/s
S' Lateral: 3.02 cm

## 2024-01-27 ENCOUNTER — Ambulatory Visit: Payer: Self-pay | Admitting: Physician Assistant

## 2024-02-08 NOTE — Progress Notes (Unsigned)
 Patient ID: Gabriela Smith MRN: [id number] DOB/AGE: 1940/11/11 83 y.o.  Primary Care Physician:Gottschalk, Norene HERO, DO Primary Cardiologist: Hochrein  CC:  Aortic valvular disease management     FOCUSED PROBLEM LIST:   Aortic stenosis  AVA 0.88, MG 23, SVI 25, DI 0.28, EF 55 to 60% TTE November 2025 EKG atrial fibrillation with PVCs and no bundle-branch blocks ACS 2021 CABG LIMA to D1, vein graft to LAD, vein graft to OM, vein graft to PDA Persistent atrial fibrillation Hypertension Hyperlipidemia Aortic atherosclerosis Chest x-ray 2021 CKD stage II Alzheimer's dementia BMI 30.7/BSA 1.15 February 2024:  Patient consents to use of AI scribe. The patient is an 83 year old female with above listed medical problems referred for recommendations regarding her aortic valve disease.  Was last seen in May of this year and was doing well.  She experiences shortness of breath during major activities such as walking from the elevator to the clinic. No lightheadedness, dizziness, or chest discomfort occurs during normal activities, but she notes shortness of breath with strenuous activities like running or dancing. She does not experience any issues with breathing while lying flat.  She has chronic swelling in her legs, particularly in her feet, which has been persistent. She is currently on amlodipine .  She denies any bleeding with Eliquis .  She lives in an apartment above her daughter's house and used to enjoy activities such as golfing and gardening. She has not noticed increased fatigue with these activities.  She lost her husband of 64 years two years ago, which has been a significant emotional struggle for her         Past Medical History:  Diagnosis Date   AAA (abdominal aortic aneurysm) without rupture 08/24/2015   Acute CHF (congestive heart failure) 04/15/2019   Acute heart failure with preserved ejection fraction (HFpEF) 12/12/2022   Acute respiratory failure with hypoxia  12/12/2022   Aortic atherosclerosis 02/11/2017   Arthritis    OA lt knee- cortizone inj. q 4 months AND OA ALSO IN BACK. Had knee replaced-issue resolved   Atrophic vaginitis 09/18/2016   CAD (coronary artery disease)    1993-- PTCA. Pt describes a near total blockage of apparently the LAD and a 65% stenosis elsewhere.   Chronic venous insufficiency 01/06/2023   Coronary atherosclerosis of native coronary artery 11/28/2008   Degenerative disc disease, lumbar 02/08/2013   Edema 07/03/2020   Essential hypertension 12/12/2022   History of bladder cancer    hx  TCC of bladder ,  Ta G3-  first occurence 02-20-2012--  s/p BCG tx's  and  mitomycin  C   Hyperlipidemia LDL goal <70 11/28/2008   Hypokalemia 12/12/2022   Intermittent claudication 02/17/2019   Malignant neoplasm of urinary bladder 11/04/2012   Mild cognitive impairment with memory loss 07/20/2023   Nocturia    Osteoarthritis of left knee 11/04/2012   Osteopenia with high risk of fracture 04/20/2013   PAD (peripheral artery disease) 01/06/2023   Paroxysmal atrial fibrillation 08/12/2019   Peripheral neuropathy 03/07/2019   Peripheral vascular insufficiency 02/17/2019   S/P CABG x 4 04/26/2019   S/P left TKA 11/22/2012   Sigmoid diverticulosis    Status post hip replacement, right 12/25/2015   Vitamin D  deficiency 02/08/2013    Past Surgical History:  Procedure Laterality Date   CHOLECYSTECTOMY  2003 (approx)   CLIPPING OF ATRIAL APPENDAGE N/A 04/25/2019   Procedure: Clipping Of Atrial Appendage;  Surgeon: Shyrl Olcott KIDD, MD;  Location: [facility];  Service: Open Heart Surgery;  Laterality: N/A;   COLONOSCOPY  05/31/2003   CORONARY ANGIOPLASTY  03/11/1991   to LAD   CORONARY ARTERY BYPASS GRAFT N/A 04/25/2019   Procedure: CORONARY ARTERY BYPASS GRAFTING (CABG) times four using left internal mammary artery and left greater saphenous vein harvested endoscipically.;  Surgeon: Shyrl Linnie KIDD, MD;  Location: [facility] OR;   Service: Open Heart Surgery;  Laterality: N/A;   CYSTOSCOPY WITH BIOPSY  05/09/2011   Procedure: CYSTOSCOPY WITH BIOPSY;  Surgeon: Oneil JAYSON Rafter, MD;  Location: [facility];  Service: Urology;  Laterality: N/A;  WITH BLADDER BIOPSY GYRUS   LEFT HEART CATH AND CORONARY ANGIOGRAPHY N/A 04/19/2019   Procedure: LEFT HEART CATH AND CORONARY ANGIOGRAPHY;  Surgeon: Anner Alm ORN, MD;  Location: [facility] INVASIVE CV LAB;  Service: Cardiovascular;  Laterality: N/A;   TOTAL HIP ARTHROPLASTY Right 12/25/2015   Procedure: RIGHT TOTAL HIP ARTHROPLASTY ANTERIOR APPROACH;  Surgeon: Donnice Car, MD;  Location: [facility];  Service: Orthopedics;  Laterality: Right;   TOTAL KNEE ARTHROPLASTY Left 11/22/2012   Procedure: LEFT TOTAL KNEE ARTHROPLASTY;  Surgeon: Donnice JONETTA Car, MD;  Location: [facility];  Service: Orthopedics;  Laterality: Left;   TRANSURETHRAL RESECTION OF BLADDER TUMOR  03/14/2011   Procedure: TRANSURETHRAL RESECTION OF BLADDER TUMOR (TURBT);  Surgeon: Mark C Ottelin, MD;  Location: [facility];  Service: Urology;  Laterality: N/A;   TRANSURETHRAL RESECTION OF BLADDER TUMOR  05/09/2011   Procedure: TRANSURETHRAL RESECTION OF BLADDER TUMOR (TURBT);  Surgeon: Mark C Ottelin, MD;  Location: [facility];  Service: Urology;  Laterality: N/A;   TRANSURETHRAL RESECTION OF BLADDER TUMOR  03/08/2012   Procedure: TRANSURETHRAL RESECTION OF BLADDER TUMOR (TURBT);  Surgeon: Mark C Ottelin, MD;  Location: [facility];  Service: Urology;  Laterality: N/A;   TRANSURETHRAL RESECTION OF BLADDER TUMOR WITH GYRUS (TURBT-GYRUS) N/A 03/24/2014   Procedure: TRANSURETHRAL RESECTION OF BLADDER TUMOR WITH GYRUS (TURBT-GYRUS);  Surgeon: Mark C Ottelin, MD;  Location: [facility];  Service: Urology;  Laterality: N/A;   UMBILICAL HERNIA REPAIR  2001  (approx)    Family History  Problem Relation Age of Onset   Coronary artery disease Father 38    Hyperlipidemia Father    Heart attack Mother 41   Hyperlipidemia Mother    Osteoporosis Sister    Hyperlipidemia Sister    Heart attack Daughter 79       CABG x 5   Heart disease Son 1       Stents x 3    Social History   Socioeconomic History   Marital status: Widowed    Spouse name: Lynwood   Number of children: 3   Years of education: 12   Highest education level: High school graduate  Occupational History   Occupation: retired  Tobacco Use   Smoking status: Former    Current packs/day: 0.00    Average packs/day: 0.5 packs/day for 5.0 years (2.5 ttl pk-yrs)    Types: Cigarettes    Start date: 03/10/1971    Quit date: 03/09/1976    Years since quitting: 47.9   Smokeless tobacco: Never  Vaping Use   Vaping status: Never Used  Substance and Sexual Activity   Alcohol use: Not Currently    Comment: rare glass of wine   Drug use: No   Sexual activity: Not Currently  Other Topics Concern   Not on file  Social History Narrative   Married with 3 children and 4 grandchildren.  Takes care  of husband with Parkinsons   Lives in garage apartment at her daughters home.   Right handed   Drinks caffeine prn   retired   Chief Executive Officer Drivers of [employer]: Low Risk  (12/03/2023)   Overall Financial Resource Strain (CARDIA)    Difficulty of Paying Living Expenses: Not hard at all  Food Insecurity: No Food Insecurity (12/03/2023)   Hunger Vital Sign    Worried About Running Out of Food in the Last Year: Never true    Ran Out of Food in the Last Year: Never true  Transportation Needs: No Transportation Needs (12/03/2023)   PRAPARE - Administrator, Civil Service (Medical): No    Lack of Transportation (Non-Medical): No  Physical Activity: Inactive (12/03/2023)   Exercise Vital Sign    Days of Exercise per Week: 0 days    Minutes of Exercise per Session: 0 min  Stress: No Stress Concern Present (12/03/2023)   [employer] of Occupational Health -  Occupational Stress Questionnaire    Feeling of Stress: Not at all  Social Connections: Moderately Isolated (12/03/2023)   Social Connection and Isolation Panel    Frequency of Communication with Friends and Family: More than three times a week    Frequency of Social Gatherings with Friends and Family: More than three times a week    Attends Religious Services: 1 to 4 times per year    Active Member of [employer] or Organizations: No    Attends Banker Meetings: Never    Marital Status: Widowed  Intimate Partner Violence: Not At Risk (12/03/2023)   Humiliation, Afraid, Rape, and Kick questionnaire    Fear of Current or Ex-Partner: No    Emotionally Abused: No    Physically Abused: No    Sexually Abused: No     Prior to Admission medications   Medication Sig Start Date End Date Taking? Authorizing Provider  acetaminophen  (TYLENOL ) 500 MG tablet Take 2 tablets (1,000 mg total) by mouth every 8 (eight) hours. 12/26/15   Danella Cough, PA-C  amLODipine  (NORVASC ) 5 MG tablet Take 1 tablet (5 mg total) by mouth daily. 09/25/23   [name], DO  aspirin  EC 81 MG EC tablet Take 1 tablet (81 mg total) by mouth daily. 05/02/19   Barrett, Erin R, PA-C  cholecalciferol  (VITAMIN D ) 1000 UNITS tablet Take 1,000 Units by mouth daily after breakfast.     [provider]  Co-Enzyme Q10 200 MG CAPS Take 200 mg by mouth daily after breakfast.  Patient not taking: Reported on 12/21/2023    [provider]  digoxin  (LANOXIN ) 0.125 MG tablet TAKE ONE TABLET BY MOUTH DAILY 04/01/23   Lavona Agent, MD  donepezil  (ARICEPT ) 10 MG tablet Take 1 tablet (10 mg total) by mouth daily. 01/15/24   Wertman, Sara E, PA-C  ELIQUIS  5 MG TABS tablet Take 1 tablet (5 mg total) by mouth 2 (two) times daily. 01/01/24   Lavona Agent, MD  Flaxseed, Linseed, (FLAX SEED OIL) 1000 MG CAPS Take 1,000 mg by mouth daily after breakfast.     [provider]  furosemide  (LASIX ) 40 MG tablet  TAKE ONE TABLET BY MOUTH EVERY DAY 12/01/23   Lavona Agent, MD  GNP VITAMIN B-12 500 MCG tablet TAKE 1 TABLET DAILY 07/30/20   Lavona Agent, MD  hydrocortisone  1 % lotion Apply 1 application. topically 2 (two) times daily. 07/19/21   Ijaola, Onyeje M, NP  levothyroxine  (SYNTHROID ) 50 MCG tablet  Take 1 tablet (50 mcg total) by mouth daily. 12/23/23   [name], DO  Magnesium  300 MG CAPS Take 300 mg by mouth at bedtime.     [provider]  metoprolol  tartrate (LOPRESSOR ) 100 MG tablet Take 1 tablet (100 mg total) by mouth 2 (two) times daily. 02/17/23   Fountain, Madison L, NP  nitroGLYCERIN  (NITROSTAT ) 0.4 MG SL tablet PLACE 1 TABLET UNDER THE TONGUE AT ONSET OF CHEST PAIN EVERY 5 MINTUES UP TO 3 TIMES AS NEEDED 05/30/22   Lavona Agent, MD  potassium chloride  SA (KLOR-CON  M) 20 MEQ tablet Take 1 tablet (20 mEq total) by mouth daily. Patient not taking: Reported on 12/21/2023 12/14/22   Evonnie Lenis, MD  REPATHA  SURECLICK 140 MG/ML SOAJ INJECT 140mg  (1ml) EVERY TWO WEEKS AS DIRECTED 06/15/23   Hilty, Vinie BROCKS, MD  triamcinolone  (KENALOG ) 0.025 % ointment Apply 1 Application topically 2 (two) times daily. 03/06/23   Milian, Marry Lenis, FNP  TURMERIC PO Take 1 capsule by mouth daily after breakfast.    [provider]    Allergies  Allergen Reactions   Fish Allergy Hives, Shortness Of Breath and Other (See Comments)    Bottom-feeder fish   Fish Protein-Containing Drug Products Hives, Shortness Of Breath and Other (See Comments)    Bottom-feeder fish   Shellfish Protein-Containing Drug Products Hives and Shortness Of Breath   Lasix  [Furosemide ] Swelling and Other (See Comments)    Edema, elevated BP   Codeine Rash   Latex Itching and Rash    Reports Elastic in underwear, under wire bras, and now surgical cap cause rash and itching.    Macrodantin [Nitrofurantoin Macrocrystal] Other (See Comments)    Unknown reaction    Niacin Other (See Comments)    Muscle  aches   Niaspan [Niacin Er (Antihyperlipidemic)] Other (See Comments)    Muscle aches   Statins Other (See Comments)    Muscle aches/ cramps   Sulfa Antibiotics Rash and Other (See Comments)    Muscle aches/ cramps, also   Vicodin [Hydrocodone -Acetaminophen ] Nausea And Vomiting and Other (See Comments)    SEVERE N/V   Zetia  [Ezetimibe ] Other (See Comments)    Muscle aches    REVIEW OF SYSTEMS:  General: no fevers/chills/night sweats Eyes: no blurry vision, diplopia, or amaurosis ENT: no sore throat or hearing loss Resp: no cough, wheezing, or hemoptysis CV: no edema or palpitations GI: no abdominal pain, nausea, vomiting, diarrhea, or constipation GU: no dysuria, frequency, or hematuria Skin: no rash Neuro: no headache, numbness, tingling, or weakness of extremities Musculoskeletal: no joint pain or swelling Heme: no bleeding, DVT, or easy bruising Endo: no polydipsia or polyuria  BP (!) 166/74   Pulse (!) 116   Ht 5' 3 (1.6 m)   Wt 173 lb 9.6 oz (78.7 kg)   SpO2 96%   BMI 30.75 kg/m   PHYSICAL EXAM: GEN:  AO x 3 in no acute distress HEENT: normal Dentition: Normal Neck: JVP normal. +2 carotid upstrokes without bruits. No thyromegaly. Lungs: equal expansion, clear bilaterally CV: Irregular rate and rhythm, 3 out of 6 systolic ejection murmur Abd: soft, non-tender, non-distended; no bruit; positive bowel sounds Ext: no edema, ecchymoses, or cyanosis Vascular: 2+ femoral pulses, 2+ radial pulses       Skin: warm and dry without rash Neuro: CN II-XII grossly intact; motor and sensory grossly intact    DATA AND STUDIES:  EKG:   EKG Interpretation Date/Time:  Monday February 15 2024 09:08:56 EST  Ventricular Rate:  96 PR Interval:    QRS Duration:  76 QT Interval:  382 QTC Calculation: 482 R Axis:   58  Text Interpretation: Atrial fibrillation with premature ventricular or aberrantly conducted complexes ST & T wave abnormality, consider lateral ischemia  Prolonged QT When compared with ECG of 12-Dec-2022 23:15, ST now depressed in Inferior leads T wave inversion more evident in Lateral leads Confirmed by Wendel Haws ([id number]) on 02/15/2024 9:15:41 AM        CARDIAC STUDIES: Refer to CV Procedures and Imaging Tabs  08/07/2023: Hemoglobin 12.4; Platelets 263 10/06/2023: ALT 15; BUN 9; Creatinine, Ser 0.86; Magnesium  2.3; Potassium 3.6; Sodium 141; TSH 2.790   STS RISK CALCULATOR: Pending  NYHA CLASS: 1/2    ASSESSMENT AND PLAN:   1. Nonrheumatic aortic valve stenosis   2. Acute coronary syndrome (HCC)   3. Persistent atrial fibrillation (HCC)   4. Hypercoagulable state due to longstanding persistent atrial fibrillation (HCC)   5. Essential hypertension   6. Hyperlipidemia LDL goal <70   7. Aortic atherosclerosis   8. CKD (chronic kidney disease) stage 2, GFR 60-89 ml/min     Aortic stenosis: I viewed the patient's echocardiogram which demonstrates paradoxical low-flow low gradient aortic stenosis and a heavily calcified valve.  The patient has a mild symptom burden.  We have agreed for her to follow-up with me in 6 months with echocardiogram and if she develops any symptoms of worsening shortness of breath, exertional chest discomfort, presyncope or syncope she will notify us  for an expedited visit. Acute coronary syndrome: Now status post multivessel CABG.  Continue Eliquis  5 mg twice daily, discontinue aspirin  81 mg, continue Repatha  140 mg every 2 weeks, Lopressor  100 mg twice daily, as needed nitroglycerin  Persistent atrial fibrillation: Continue Eliquis  5 mg twice daily, will Lopressor  100 mg twice daily, discontinue aspirin  81 mg Hypercoagulable state: Continue Eliquis  5 mg twice daily, discontinue aspirin  81 mg Hypertension: Discontinue amlodipine  5 mg due to ankle swelling, start losartan  25 mg, continue Lopressor  100 mg twice daily Hyperlipidemia: LDL in July 2025 was 32.  Continue Repatha  140 mg every 2 weeks Aortic cath  sclerosis: Continue Eliquis  5 mg twice daily, Repatha  140 mg every 2 weeks CKD stage II: Start losartan  25 mg  I have personally reviewed the patients imaging data as summarized above.  I have reviewed the natural history of aortic stenosis with the patient and family members who are present today. We have discussed the limitations of medical therapy and the poor prognosis associated with symptomatic aortic stenosis. We have also reviewed potential treatment options, including palliative medical therapy, conventional surgical aortic valve replacement, and transcatheter aortic valve replacement. We discussed treatment options in the context of this patient's specific comorbid medical conditions.   All of the patient's questions were answered today. Will make further recommendations based on the results of studies outlined above.   I spent 48 minutes reviewing all clinical data during and prior to this visit including all relevant imaging studies, laboratories, clinical information from other health systems and prior notes from both Cardiology and other specialties, interviewing the patient, conducting a complete physical examination, and coordinating care in order to formulate a comprehensive and personalized evaluation and treatment plan.   Vinita Prentiss K Alizay Bronkema, MD  02/15/2024 9:53 AM    [facility] [address] [place], [place], [place]  [address] Phone: [phone]; Fax: [phone]

## 2024-02-15 ENCOUNTER — Ambulatory Visit: Attending: Internal Medicine | Admitting: Internal Medicine

## 2024-02-15 ENCOUNTER — Encounter: Payer: Self-pay | Admitting: Internal Medicine

## 2024-02-15 VITALS — BP 166/74 | HR 116 | Ht 63.0 in | Wt 173.6 lb

## 2024-02-15 DIAGNOSIS — N182 Chronic kidney disease, stage 2 (mild): Secondary | ICD-10-CM

## 2024-02-15 DIAGNOSIS — I350 Nonrheumatic aortic (valve) stenosis: Secondary | ICD-10-CM

## 2024-02-15 DIAGNOSIS — I249 Acute ischemic heart disease, unspecified: Secondary | ICD-10-CM | POA: Diagnosis not present

## 2024-02-15 DIAGNOSIS — D6869 Other thrombophilia: Secondary | ICD-10-CM

## 2024-02-15 DIAGNOSIS — I10 Essential (primary) hypertension: Secondary | ICD-10-CM

## 2024-02-15 DIAGNOSIS — I4811 Longstanding persistent atrial fibrillation: Secondary | ICD-10-CM

## 2024-02-15 DIAGNOSIS — I4819 Other persistent atrial fibrillation: Secondary | ICD-10-CM

## 2024-02-15 DIAGNOSIS — E785 Hyperlipidemia, unspecified: Secondary | ICD-10-CM

## 2024-02-15 DIAGNOSIS — I700 Atherosclerosis of aorta: Secondary | ICD-10-CM

## 2024-02-15 MED ORDER — LOSARTAN POTASSIUM 25 MG PO TABS: 25.0000 mg | ORAL_TABLET | Freq: Every day | ORAL | 3 refills | Status: AC

## 2024-02-15 NOTE — Patient Instructions (Signed)
 Medication Instructions:  Your physician has recommended you make the following change in your medication:  STOP Aspirin  STOP Amlodipine  START Losartan  25 mg daily  *If you need a refill on your cardiac medications before your next appointment, please call your pharmacy*  Lab Work: None ordered   Testing/Procedures: Your physician has requested that you have an echocardiogram in 5 months. Echocardiography is a painless test that uses sound waves to create images of your heart. It provides your doctor with information about the size and shape of your heart and how well your heart's chambers and valves are working. This procedure takes approximately one hour. There are no restrictions for this procedure. Please do NOT wear cologne, perfume, aftershave, or lotions (deodorant is allowed). Please arrive 15 minutes prior to your appointment time.  Please note: We ask at that you not bring children with you during ultrasound (echo/ vascular) testing. Due to room size and safety concerns, children are not allowed in the ultrasound rooms during exams. Our front office staff cannot provide observation of children in our lobby area while testing is being conducted. An adult accompanying a patient to their appointment will only be allowed in the ultrasound room at the discretion of the ultrasound technician under special circumstances. We apologize for any inconvenience.   Follow-Up: At [facility], you and your health needs are our priority.  As part of our continuing mission to provide you with exceptional heart care, our providers are all part of one team.  This team includes your primary Cardiologist (physician) and Advanced Practice Providers or APPs (Physician Assistants and Nurse Practitioners) who all work together to provide you with the care you need, when you need it.  Your next appointment:   6 month(s)  Provider:   Dr. Wendel   Thank you for choosing [facility]!!   [phone]

## 2024-03-17 ENCOUNTER — Encounter: Payer: Self-pay | Admitting: Family Medicine

## 2024-03-18 NOTE — Telephone Encounter (Signed)
 Appointment scheduled for Tuesday 1/13

## 2024-03-18 NOTE — Telephone Encounter (Signed)
 Luke, please offer her a same-day visit

## 2024-03-22 ENCOUNTER — Ambulatory Visit (INDEPENDENT_AMBULATORY_CARE_PROVIDER_SITE_OTHER): Admitting: Family Medicine

## 2024-03-22 ENCOUNTER — Ambulatory Visit

## 2024-03-22 VITALS — BP 154/72 | HR 95 | Temp 98.0°F | Ht 63.0 in | Wt 179.6 lb

## 2024-03-22 DIAGNOSIS — I10 Essential (primary) hypertension: Secondary | ICD-10-CM | POA: Diagnosis not present

## 2024-03-22 DIAGNOSIS — R0609 Other forms of dyspnea: Secondary | ICD-10-CM

## 2024-03-22 DIAGNOSIS — I4819 Other persistent atrial fibrillation: Secondary | ICD-10-CM

## 2024-03-22 DIAGNOSIS — R600 Localized edema: Secondary | ICD-10-CM

## 2024-03-22 NOTE — Progress Notes (Signed)
 "  Subjective: CC: Leg edema PCP: [name], DO YEP:Qjbz Gabriela Smith is a 84 y.o. female presenting to clinic today for:  Patient's daughter messaged on 03/18/2024 for increasing leg edema right greater than left.  Her right leg is actually leaking some.  She was actually seen the month prior with cardiology who discontinued her Norvasc  due to leg edema and she was transition over to losartan  25 mg daily and continued on metoprolol  100 mg twice daily.  Today she is brought to the office by her daughter and presents and notes that she has been having some increasing leg edema over the last several days.  When her specialist transition her off of the Norvasc  and onto losartan  it really did not make a huge difference in her lower extremity edema.  Her daughter has noticed that she is more short of breath over the last several months but admits that she is very sedentary.  She continues to eat a lot of salt containing products like bacon, sausage etc.  She does not add salt to food however.  ROS: Per HPI  Allergies[1] Past Medical History:  Diagnosis Date   AAA (abdominal aortic aneurysm) without rupture 08/24/2015   Acute CHF (congestive heart failure) 04/15/2019   Acute heart failure with preserved ejection fraction (HFpEF) 12/12/2022   Acute respiratory failure with hypoxia 12/12/2022   Aortic atherosclerosis 02/11/2017   Arthritis    OA lt knee- cortizone inj. q 4 months AND OA ALSO IN BACK. Had knee replaced-issue resolved   Atrophic vaginitis 09/18/2016   CAD (coronary artery disease)    1993-- PTCA. Pt describes a near total blockage of apparently the LAD and a 65% stenosis elsewhere.   Chronic venous insufficiency 01/06/2023   Coronary atherosclerosis of native coronary artery 11/28/2008   Degenerative disc disease, lumbar 02/08/2013   Edema 07/03/2020   Essential hypertension 12/12/2022   History of bladder cancer    hx  TCC of bladder ,  Ta G3-  first occurence 02-20-2012--   s/p BCG tx's  and  mitomycin  C   Hyperlipidemia LDL goal <70 11/28/2008   Hypokalemia 12/12/2022   Intermittent claudication 02/17/2019   Malignant neoplasm of urinary bladder 11/04/2012   Mild cognitive impairment with memory loss 07/20/2023   Nocturia    Osteoarthritis of left knee 11/04/2012   Osteopenia with high risk of fracture 04/20/2013   PAD (peripheral artery disease) 01/06/2023   Paroxysmal atrial fibrillation 08/12/2019   Peripheral neuropathy 03/07/2019   Peripheral vascular insufficiency 02/17/2019   S/P CABG x 4 04/26/2019   S/P left TKA 11/22/2012   Sigmoid diverticulosis    Status post hip replacement, right 12/25/2015   Vitamin D  deficiency 02/08/2013   Current Medications[2] Social History   Socioeconomic History   Marital status: Widowed    Spouse name: Lynwood   Number of children: 3   Years of education: 12   Highest education level: High school graduate  Occupational History   Occupation: retired  Tobacco Use   Smoking status: Former    Current packs/day: 0.00    Average packs/day: 0.5 packs/day for 5.0 years (2.5 ttl pk-yrs)    Types: Cigarettes    Start date: 03/10/1971    Quit date: 03/09/1976    Years since quitting: 48.0   Smokeless tobacco: Never  Vaping Use   Vaping status: Never Used  Substance and Sexual Activity   Alcohol use: Not Currently    Comment: rare glass of wine   Drug use:  No   Sexual activity: Not Currently  Other Topics Concern   Not on file  Social History Narrative   Married with 3 children and 4 grandchildren.  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 with Parkinsons   Lives in garage apartment at her daughters home.   Right handed   Drinks caffeine prn   retired   Social Drivers of Health   Tobacco Use: Medium Risk (02/15/2024)   Patient History    Smoking Tobacco Use: Former    Smokeless Tobacco Use: Never    Passive Exposure: Not on file  Financial Resource Strain: Low Risk (12/03/2023)   Overall Financial Resource Strain  (CARDIA)    Difficulty of Paying Living Expenses: Not hard at all  Food Insecurity: No Food Insecurity (12/03/2023)   Epic    Worried About Programme Researcher, Broadcasting/film/video in the Last Year: Never true    Ran Out of Food in the Last Year: Never true  Transportation Needs: No Transportation Needs (12/03/2023)   Epic    Lack of Transportation (Medical): No    Lack of Transportation (Non-Medical): No  Physical Activity: Inactive (12/03/2023)   Exercise Vital Sign    Days of Exercise per Week: 0 days    Minutes of Exercise per Session: 0 min  Stress: No Stress Concern Present (12/03/2023)   [employer] of Occupational Health - Occupational Stress Questionnaire    Feeling of Stress: Not at all  Social Connections: Moderately Isolated (12/03/2023)   Social Connection and Isolation Panel    Frequency of Communication with Friends and Family: More than three times a week    Frequency of Social Gatherings with Friends and Family: More than three times a week    Attends Religious Services: 1 to 4 times per year    Active Member of [employer] or Organizations: No    Attends Banker Meetings: Never    Marital Status: Widowed  Intimate Partner Violence: Not At Risk (12/03/2023)   Epic    Fear of Current or Ex-Partner: No    Emotionally Abused: No    Physically Abused: No    Sexually Abused: No  Depression (PHQ2-9): Low Risk (12/03/2023)   Depression (PHQ2-9)    PHQ-2 Score: 0  Alcohol Screen: Low Risk (12/03/2023)   Alcohol Screen    Last Alcohol Screening Score (AUDIT): 0  Housing: Unknown (12/03/2023)   Epic    Unable to Pay for Housing in the Last Year: No    Number of Times Moved in the Last Year: Not on file    Homeless in the Last Year: No  Utilities: Not At Risk (12/03/2023)   Epic    Threatened with loss of utilities: No  Health Literacy: Adequate Health Literacy (12/03/2023)   B1300 Health Literacy    Frequency of need for help with medical instructions: Never   Family History   Problem Relation Age of Onset   Coronary artery disease Father 65   Hyperlipidemia Father    Heart attack Mother 54   Hyperlipidemia Mother    Osteoporosis Sister    Hyperlipidemia Sister    Heart attack Daughter 81       CABG x 5   Heart disease Son 6       Stents x 3    Objective: Office vital signs reviewed. BP (!) 154/72   Pulse 95   Temp 98 F (36.7 C) (Oral)   Ht 5' 3 (1.6 m)   Wt 179 lb 9.6 oz (81.5 kg)  SpO2 96%   BMI 31.81 kg/m   Physical Examination:  General: Awake, alert, nontoxic female., No acute distress HEENT: Sclera white.  Moist mucous membranes Cardio: regular rate and rhythm, S1S2 heard, no murmurs appreciated Pulm: clear to auscultation bilaterally, no wheezes, rhonchi or rales; normal work of breathing on room air Extremities: Pitting lower extremity edema with weeping of the right lower extremity.  She has varicose veins bilaterally, particularly in the lower leg and ankles.  Hyperemia without warmth.  No results found.   Assessment/ Plan: 84 y.o. female   Bilateral leg edema - Plan: DG Chest 2 View  Essential hypertension  Persistent atrial fibrillation (HCC)  Dyspnea on exertion - Plan: DG Chest 2 View   I obtain chest x-ray given reports of dyspnea on exertion.  Personal review of chest x-ray demonstrated no acute pulmonary infiltrates and does not appear to have any pulmonary edema to suggest fluid overload from a CHF standpoint.  Her blood pressure is not controlled during today's visit.  I again reinforced salt restriction and I am going to reassess her again in a few days.  If blood pressure is persistently elevated we will plan to advance losartan  to 50 mg daily.  Will also plan for BMP given initiation of ARB.  I have ordered Unna boots and we will remove these on Monday if she is able to tolerate them through the weekend.  She could not come in on Friday to have them removed due to transportation issues.   Norene CHRISTELLA Fielding,  DO [facility] [phone]     [1]  Allergies Allergen Reactions   Fish Allergy Hives, Shortness Of Breath and Other (See Comments)    Bottom-feeder fish   Fish Protein-Containing Drug Products Hives, Shortness Of Breath and Other (See Comments)    Bottom-feeder fish   Shellfish Protein-Containing Drug Products Hives and Shortness Of Breath   Lasix  [Furosemide ] Swelling and Other (See Comments)    Edema, elevated BP   Codeine Rash   Latex Itching and Rash    Reports Elastic in underwear, under wire bras, and now surgical cap cause rash and itching.    Macrodantin [Nitrofurantoin Macrocrystal] Other (See Comments)    Unknown reaction    Niacin Other (See Comments)    Muscle aches   Niaspan [Niacin Er (Antihyperlipidemic)] Other (See Comments)    Muscle aches   Statins Other (See Comments)    Muscle aches/ cramps   Sulfa Antibiotics Rash and Other (See Comments)    Muscle aches/ cramps, also   Vicodin [Hydrocodone -Acetaminophen ] Nausea And Vomiting and Other (See Comments)    SEVERE N/V   Zetia  [Ezetimibe ] Other (See Comments)    Muscle aches  [2]  Current Outpatient Medications:    acetaminophen  (TYLENOL ) 500 MG tablet, Take 2 tablets (1,000 mg total) by mouth every 8 (eight) hours., Disp: 30 tablet, Rfl: 0   cholecalciferol  (VITAMIN D ) 1000 UNITS tablet, Take 1,000 Units by mouth daily after breakfast. , Disp: , Rfl:    Co-Enzyme Q10 200 MG CAPS, Take 200 mg by mouth daily after breakfast. , Disp: , Rfl:    digoxin  (LANOXIN ) 0.125 MG tablet, TAKE ONE TABLET BY MOUTH DAILY, Disp: 90 tablet, Rfl: 3   donepezil  (ARICEPT ) 10 MG tablet, Take 1 tablet (10 mg total) by mouth daily., Disp: 30 tablet, Rfl: 3   ELIQUIS  5 MG TABS tablet, Take 1 tablet (5 mg total) by mouth 2 (two) times daily., Disp: 180 tablet, Rfl: 1  Flaxseed, Linseed, (FLAX SEED OIL) 1000 MG CAPS, Take 1,000 mg by mouth daily after breakfast. , Disp: , Rfl:    furosemide  (LASIX ) 40 MG  tablet, TAKE ONE TABLET BY MOUTH EVERY DAY, Disp: 90 tablet, Rfl: 2   GNP VITAMIN B-12 500 MCG tablet, TAKE 1 TABLET DAILY, Disp: 90 tablet, Rfl: 0   hydrocortisone  1 % lotion, Apply 1 application. topically 2 (two) times daily., Disp: 118 mL, Rfl: 0   levothyroxine  (SYNTHROID ) 50 MCG tablet, Take 1 tablet (50 mcg total) by mouth daily., Disp: 100 tablet, Rfl: 2   losartan  (COZAAR ) 25 MG tablet, Take 1 tablet (25 mg total) by mouth daily., Disp: 90 tablet, Rfl: 3   Magnesium  300 MG CAPS, Take 300 mg by mouth at bedtime. , Disp: , Rfl:    metoprolol  tartrate (LOPRESSOR ) 100 MG tablet, Take 1 tablet (100 mg total) by mouth 2 (two) times daily., Disp: 180 tablet, Rfl: 3   nitroGLYCERIN  (NITROSTAT ) 0.4 MG SL tablet, PLACE 1 TABLET UNDER THE TONGUE AT ONSET OF CHEST PAIN EVERY 5 MINTUES UP TO 3 TIMES AS NEEDED, Disp: 25 tablet, Rfl: 0   potassium chloride  SA (KLOR-CON  M) 20 MEQ tablet, Take 1 tablet (20 mEq total) by mouth daily., Disp: 30 tablet, Rfl: 1   REPATHA  SURECLICK 140 MG/ML SOAJ, INJECT 140mg  (1ml) EVERY TWO WEEKS AS DIRECTED, Disp: 2 mL, Rfl: 11   triamcinolone  (KENALOG ) 0.025 % ointment, Apply 1 Application topically 2 (two) times daily., Disp: 30 g, Rfl: 0   TURMERIC PO, Take 1 capsule by mouth daily after breakfast., Disp: , Rfl:  No current facility-administered medications for this visit.  Facility-Administered Medications Ordered in Other Visits:    DOBUTamine  (DOBUTREX ) 1,000 mcg/mL in dextrose  5% 250 mL infusion, 20 mcg/kg/min, Intravenous, Continuous, Maranda Leim DEL, MD, Last Rate: 96.4 mL/hr at 12/24/15 0840, 20 mcg/kg/min at 12/24/15 0840  "

## 2024-03-28 ENCOUNTER — Ambulatory Visit: Admitting: Family Medicine

## 2024-03-28 ENCOUNTER — Encounter: Payer: Self-pay | Admitting: Family Medicine

## 2024-03-28 VITALS — BP 168/94 | HR 61 | Temp 97.3°F | Ht 63.0 in | Wt 179.4 lb

## 2024-03-28 DIAGNOSIS — R600 Localized edema: Secondary | ICD-10-CM

## 2024-03-28 DIAGNOSIS — I10 Essential (primary) hypertension: Secondary | ICD-10-CM | POA: Diagnosis not present

## 2024-03-28 MED ORDER — HYDROCHLOROTHIAZIDE 12.5 MG PO CAPS: 12.5000 mg | ORAL_CAPSULE | Freq: Every day | ORAL | 0 refills | Status: AC

## 2024-03-28 NOTE — Progress Notes (Signed)
 "  Subjective: CC: Follow-up leg edema PCP: Gabriela Norene HERO, DO YEP:Qjbz Gabriela Smith is a 84 y.o. female presenting to clinic today for:  Patient was seen last week for bilateral leg edema with right lower extremity weeping.  She has history of heart failure.  Chest x-ray was obtained and this was negative for any evidence of fluid overload.  She is compliant with Lasix  daily.  Her daughter is present for today's visit and notes that she has been abstaining from sausage and bacon since our last visit.  We had wrapped her legs and Unna boots and she notes that the left lower extremity seem to improve in swelling but the right lower extremity really remained unchanged.  She has history of vascular disease and follows up with vascular but she has not yet scheduled a follow-up visit.  Her daughter will make sure this is made today.   ROS: Per HPI  Allergies[1] Past Medical History:  Diagnosis Date   AAA (abdominal aortic aneurysm) without rupture 08/24/2015   Acute CHF (congestive heart failure) 04/15/2019   Acute heart failure with preserved ejection fraction (HFpEF) 12/12/2022   Acute respiratory failure with hypoxia 12/12/2022   Aortic atherosclerosis 02/11/2017   Arthritis    OA lt knee- cortizone inj. q 4 months AND OA ALSO IN BACK. Had knee replaced-issue resolved   Atrophic vaginitis 09/18/2016   CAD (coronary artery disease)    1993-- PTCA. Pt describes a near total blockage of apparently the LAD and a 65% stenosis elsewhere.   Chronic venous insufficiency 01/06/2023   Coronary atherosclerosis of native coronary artery 11/28/2008   Degenerative disc disease, lumbar 02/08/2013   Edema 07/03/2020   Essential hypertension 12/12/2022   History of bladder cancer    hx  TCC of bladder ,  Ta G3-  first occurence 02-20-2012--  s/p BCG tx's  and  mitomycin  C   Hyperlipidemia LDL goal <70 11/28/2008   Hypokalemia 12/12/2022   Intermittent claudication 02/17/2019   Malignant neoplasm of  urinary bladder 11/04/2012   Mild cognitive impairment with memory loss 07/20/2023   Nocturia    Osteoarthritis of left knee 11/04/2012   Osteopenia with high risk of fracture 04/20/2013   PAD (peripheral artery disease) 01/06/2023   Paroxysmal atrial fibrillation 08/12/2019   Peripheral neuropathy 03/07/2019   Peripheral vascular insufficiency 02/17/2019   S/P CABG x 4 04/26/2019   S/P left TKA 11/22/2012   Sigmoid diverticulosis    Status post hip replacement, right 12/25/2015   Vitamin D  deficiency 02/08/2013   Current Medications[2] Social History   Socioeconomic History   Marital status: Widowed    Spouse name: Gabriela Smith   Number of children: 3   Years of education: 12   Highest education level: High school graduate  Occupational History   Occupation: retired  Tobacco Use   Smoking status: Former    Current packs/day: 0.00    Average packs/day: 0.5 packs/day for 5.0 years (2.5 ttl pk-yrs)    Types: Cigarettes    Start date: 03/10/1971    Quit date: 03/09/1976    Years since quitting: 48.0   Smokeless tobacco: Never  Vaping Use   Vaping status: Never Used  Substance and Sexual Activity   Alcohol use: Not Currently    Comment: rare glass of wine   Drug use: No   Sexual activity: Not Currently  Other Topics Concern   Not on file  Social History Narrative   Married with 3 children and 4 grandchildren.  Takes  care of husband with Parkinsons   Lives in garage apartment at her daughters home.   Right handed   Drinks caffeine prn   retired   Social Drivers of Health   Tobacco Use: Medium Risk (02/15/2024)   Patient History    Smoking Tobacco Use: Former    Smokeless Tobacco Use: Never    Passive Exposure: Not on file  Financial Resource Strain: Low Risk (12/03/2023)   Overall Financial Resource Strain (CARDIA)    Difficulty of Paying Living Expenses: Not hard at all  Food Insecurity: No Food Insecurity (12/03/2023)   Epic    Worried About Programme Researcher, Broadcasting/film/video in  the Last Year: Never true    Ran Out of Food in the Last Year: Never true  Transportation Needs: No Transportation Needs (12/03/2023)   Epic    Lack of Transportation (Medical): No    Lack of Transportation (Non-Medical): No  Physical Activity: Inactive (12/03/2023)   Exercise Vital Sign    Days of Exercise per Week: 0 days    Minutes of Exercise per Session: 0 min  Stress: No Stress Concern Present (12/03/2023)   [employer] of Occupational Health - Occupational Stress Questionnaire    Feeling of Stress: Not at all  Social Connections: Moderately Isolated (12/03/2023)   Social Connection and Isolation Panel    Frequency of Communication with Friends and Family: More than three times a week    Frequency of Social Gatherings with Friends and Family: More than three times a week    Attends Religious Services: 1 to 4 times per year    Active Member of [employer] or Organizations: No    Attends Banker Meetings: Never    Marital Status: Widowed  Intimate Partner Violence: Not At Risk (12/03/2023)   Epic    Fear of Current or Ex-Partner: No    Emotionally Abused: No    Physically Abused: No    Sexually Abused: No  Depression (PHQ2-9): Low Risk (03/22/2024)   Depression (PHQ2-9)    PHQ-2 Score: 0  Alcohol Screen: Low Risk (12/03/2023)   Alcohol Screen    Last Alcohol Screening Score (AUDIT): 0  Housing: Unknown (12/03/2023)   Epic    Unable to Pay for Housing in the Last Year: No    Number of Times Moved in the Last Year: Not on file    Homeless in the Last Year: No  Utilities: Not At Risk (12/03/2023)   Epic    Threatened with loss of utilities: No  Health Literacy: Adequate Health Literacy (12/03/2023)   B1300 Health Literacy    Frequency of need for help with medical instructions: Never   Family History  Problem Relation Age of Onset   Coronary artery disease Father 53   Hyperlipidemia Father    Heart attack Mother 67   Hyperlipidemia Mother    Osteoporosis Sister     Hyperlipidemia Sister    Heart attack Daughter 14       CABG x 5   Heart disease Son 15       Stents x 3    Objective: Office vital signs reviewed. BP (!) 168/94   Pulse 61   Temp (!) 97.3 F (36.3 C)   Ht 5' 3 (1.6 m)   Wt 179 lb 6 oz (81.4 kg)   SpO2 90%   BMI 31.77 kg/m   Physical Examination:  General: Awake, alert, well-appearing, nontoxic elderly female, No acute distress Extremities: Persistent weeping of the right lower extremity,  specifically from a very small punctum in the right anterior lower shin.  This is clear fluid.  She has no warmth.  She has venous stasis changes to the legs bilaterally with very visible varicose veins bilaterally.  Assessment/ Plan: 84 y.o. female   Bilateral leg edema - Plan: hydrochlorothiazide  (MICROZIDE ) 12.5 MG capsule  Essential hypertension - Plan: hydrochlorothiazide  (MICROZIDE ) 12.5 MG capsule   I suspect lymphedema in the setting of venous disease.  However, we will try and see if we can get fluid and blood pressure down with HCTZ.  Discussed taking this in the morning and utilizing Lasix  in the afternoon.  Will need BMP at 1 week follow-up electrolytes and renal function given dual diuretic use.  Her daughter could not come in any days except for Thursday afternoon so I have scheduled her with my partner, Dr. Zollie.  I dressed her leg today with Coban and a nonstick bandage to help with fluid weeping.  Encouraged continued elevation of lower extremities, restriction of salt   Casey Maxfield M Miasia Crabtree, DO [facility] [phone]     [1]  Allergies Allergen Reactions   Fish Allergy Hives, Shortness Of Breath and Other (See Comments)    Bottom-feeder fish   Fish Protein-Containing Drug Products Hives, Shortness Of Breath and Other (See Comments)    Bottom-feeder fish   Shellfish Protein-Containing Drug Products Hives and Shortness Of Breath   Lasix  [Furosemide ] Swelling and Other (See Comments)     Edema, elevated BP   Codeine Rash   Latex Itching and Rash    Reports Elastic in underwear, under wire bras, and now surgical cap cause rash and itching.    Macrodantin [Nitrofurantoin Macrocrystal] Other (See Comments)    Unknown reaction    Niacin Other (See Comments)    Muscle aches   Niaspan [Niacin Er (Antihyperlipidemic)] Other (See Comments)    Muscle aches   Statins Other (See Comments)    Muscle aches/ cramps   Sulfa Antibiotics Rash and Other (See Comments)    Muscle aches/ cramps, also   Vicodin [Hydrocodone -Acetaminophen ] Nausea And Vomiting and Other (See Comments)    SEVERE N/V   Zetia  [Ezetimibe ] Other (See Comments)    Muscle aches  [2]  Current Outpatient Medications:    acetaminophen  (TYLENOL ) 500 MG tablet, Take 2 tablets (1,000 mg total) by mouth every 8 (eight) hours., Disp: 30 tablet, Rfl: 0   cholecalciferol  (VITAMIN D ) 1000 UNITS tablet, Take 1,000 Units by mouth daily after breakfast. , Disp: , Rfl:    Co-Enzyme Q10 200 MG CAPS, Take 200 mg by mouth daily after breakfast. , Disp: , Rfl:    digoxin  (LANOXIN ) 0.125 MG tablet, TAKE ONE TABLET BY MOUTH DAILY, Disp: 90 tablet, Rfl: 3   donepezil  (ARICEPT ) 10 MG tablet, Take 1 tablet (10 mg total) by mouth daily., Disp: 30 tablet, Rfl: 3   ELIQUIS  5 MG TABS tablet, Take 1 tablet (5 mg total) by mouth 2 (two) times daily., Disp: 180 tablet, Rfl: 1   Flaxseed, Linseed, (FLAX SEED OIL) 1000 MG CAPS, Take 1,000 mg by mouth daily after breakfast. , Disp: , Rfl:    furosemide  (LASIX ) 40 MG tablet, TAKE ONE TABLET BY MOUTH EVERY DAY, Disp: 90 tablet, Rfl: 2   GNP VITAMIN B-12 500 MCG tablet, TAKE 1 TABLET DAILY, Disp: 90 tablet, Rfl: 0   hydrocortisone  1 % lotion, Apply 1 application. topically 2 (two) times daily., Disp: 118 mL, Rfl: 0   levothyroxine  (SYNTHROID ) 50  MCG tablet, Take 1 tablet (50 mcg total) by mouth daily., Disp: 100 tablet, Rfl: 2   losartan  (COZAAR ) 25 MG tablet, Take 1 tablet (25 mg total) by mouth  daily., Disp: 90 tablet, Rfl: 3   Magnesium  300 MG CAPS, Take 300 mg by mouth at bedtime. , Disp: , Rfl:    metoprolol  tartrate (LOPRESSOR ) 100 MG tablet, Take 1 tablet (100 mg total) by mouth 2 (two) times daily., Disp: 180 tablet, Rfl: 3   nitroGLYCERIN  (NITROSTAT ) 0.4 MG SL tablet, PLACE 1 TABLET UNDER THE TONGUE AT ONSET OF CHEST PAIN EVERY 5 MINTUES UP TO 3 TIMES AS NEEDED, Disp: 25 tablet, Rfl: 0   potassium chloride  SA (KLOR-CON  M) 20 MEQ tablet, Take 1 tablet (20 mEq total) by mouth daily., Disp: 30 tablet, Rfl: 1   REPATHA  SURECLICK 140 MG/ML SOAJ, INJECT 140mg  (1ml) EVERY TWO WEEKS AS DIRECTED, Disp: 2 mL, Rfl: 11   triamcinolone  (KENALOG ) 0.025 % ointment, Apply 1 Application topically 2 (two) times daily., Disp: 30 g, Rfl: 0   TURMERIC PO, Take 1 capsule by mouth daily after breakfast., Disp: , Rfl:  No current facility-administered medications for this visit.  Facility-Administered Medications Ordered in Other Visits:    DOBUTamine  (DOBUTREX ) 1,000 mcg/mL in dextrose  5% 250 mL infusion, 20 mcg/kg/min, Intravenous, Continuous, Maranda Leim DEL, MD, Last Rate: 96.4 mL/hr at 12/24/15 0840, 20 mcg/kg/min at 12/24/15 0840  "

## 2024-04-01 ENCOUNTER — Other Ambulatory Visit: Payer: Self-pay | Admitting: Cardiology

## 2024-04-07 ENCOUNTER — Encounter: Payer: Self-pay | Admitting: Family Medicine

## 2024-04-07 ENCOUNTER — Ambulatory Visit: Admitting: Family Medicine

## 2024-04-07 ENCOUNTER — Ambulatory Visit: Payer: Self-pay | Admitting: Family Medicine

## 2024-04-07 VITALS — BP 168/96 | HR 158 | Temp 98.7°F | Ht 60.0 in | Wt 177.0 lb

## 2024-04-07 DIAGNOSIS — I480 Paroxysmal atrial fibrillation: Secondary | ICD-10-CM

## 2024-04-07 DIAGNOSIS — R000 Tachycardia, unspecified: Secondary | ICD-10-CM

## 2024-04-07 DIAGNOSIS — I10 Essential (primary) hypertension: Secondary | ICD-10-CM | POA: Diagnosis not present

## 2024-04-07 DIAGNOSIS — R600 Localized edema: Secondary | ICD-10-CM | POA: Diagnosis not present

## 2024-04-07 MED ORDER — DIGOXIN 125 MCG PO TABS: 125.0000 ug | ORAL_TABLET | Freq: Every day | ORAL | 0 refills | Status: AC

## 2024-04-07 MED ORDER — METOPROLOL TARTRATE 100 MG PO TABS: 100.0000 mg | ORAL_TABLET | Freq: Two times a day (BID) | ORAL | 0 refills | Status: AC

## 2024-04-07 NOTE — Progress Notes (Signed)
 "  Subjective: CC: Hypertension, leg edema PCP: [name], DO YEP:Qjbz Gabriela Smith is a 84 y.o. female presenting to clinic today for:  Patient is brought in the office by her daughter.  She notes that the leg edema has gotten a lot better.  She is tolerating the HCTZ without difficulty and denies any chest pain, shortness of breath.  She reports improvement in pain in the lower extremities.  Her legs are no longer weeping.  She denies any cramping of the lower extremities.  Patient noted to be tachycardic and hypertensive today.  She is not sure if she actually took her medications this morning.  Her daughter typically sets her up a medication pill pack and it certainly preloaded but she is not sure if she actually took the medicines this morning.  Again no symptomology as above.  She actually has a pending refill request to her cardiologist to for her digoxin  but is not totally out.  ROS: Per HPI  Allergies[1] Past Medical History:  Diagnosis Date   AAA (abdominal aortic aneurysm) without rupture 08/24/2015   Acute CHF (congestive heart failure) 04/15/2019   Acute heart failure with preserved ejection fraction (HFpEF) 12/12/2022   Acute respiratory failure with hypoxia 12/12/2022   Aortic atherosclerosis 02/11/2017   Arthritis    OA lt knee- cortizone inj. q 4 months AND OA ALSO IN BACK. Had knee replaced-issue resolved   Atrophic vaginitis 09/18/2016   CAD (coronary artery disease)    1993-- PTCA. Pt describes a near total blockage of apparently the LAD and a 65% stenosis elsewhere.   Chronic venous insufficiency 01/06/2023   Coronary atherosclerosis of native coronary artery 11/28/2008   Degenerative disc disease, lumbar 02/08/2013   Edema 07/03/2020   Essential hypertension 12/12/2022   History of bladder cancer    hx  TCC of bladder ,  Ta G3-  first occurence 02-20-2012--  s/p BCG tx's  and  mitomycin  C   Hyperlipidemia LDL goal <70 11/28/2008   Hypokalemia 12/12/2022    Intermittent claudication 02/17/2019   Malignant neoplasm of urinary bladder 11/04/2012   Mild cognitive impairment with memory loss 07/20/2023   Nocturia    Osteoarthritis of left knee 11/04/2012   Osteopenia with high risk of fracture 04/20/2013   PAD (peripheral artery disease) 01/06/2023   Paroxysmal atrial fibrillation 08/12/2019   Peripheral neuropathy 03/07/2019   Peripheral vascular insufficiency 02/17/2019   S/P CABG x 4 04/26/2019   S/P left TKA 11/22/2012   Sigmoid diverticulosis    Status post hip replacement, right 12/25/2015   Vitamin D  deficiency 02/08/2013   Current Medications[2] Social History   Socioeconomic History   Marital status: Widowed    Spouse name: Lynwood   Number of children: 3   Years of education: 12   Highest education level: High school graduate  Occupational History   Occupation: retired  Tobacco Use   Smoking status: Former    Current packs/day: 0.00    Average packs/day: 0.5 packs/day for 5.0 years (2.5 ttl pk-yrs)    Types: Cigarettes    Start date: 03/10/1971    Quit date: 03/09/1976    Years since quitting: 48.1   Smokeless tobacco: Never  Vaping Use   Vaping status: Never Used  Substance and Sexual Activity   Alcohol use: Not Currently    Comment: rare glass of wine   Drug use: No   Sexual activity: Not Currently  Other Topics Concern   Not on file  Social History Narrative  Married with 3 children and 4 grandchildren.  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 with Parkinsons   Lives in garage apartment at her daughters home.   Right handed   Drinks caffeine prn   retired   Social Drivers of Health   Tobacco Use: Medium Risk (04/07/2024)   Patient History    Smoking Tobacco Use: Former    Smokeless Tobacco Use: Never    Passive Exposure: Not on file  Financial Resource Strain: Low Risk (12/03/2023)   Overall Financial Resource Strain (CARDIA)    Difficulty of Paying Living Expenses: Not hard at all  Food Insecurity: No Food  Insecurity (12/03/2023)   Epic    Worried About Programme Researcher, Broadcasting/film/video in the Last Year: Never true    Ran Out of Food in the Last Year: Never true  Transportation Needs: No Transportation Needs (12/03/2023)   Epic    Lack of Transportation (Medical): No    Lack of Transportation (Non-Medical): No  Physical Activity: Inactive (12/03/2023)   Exercise Vital Sign    Days of Exercise per Week: 0 days    Minutes of Exercise per Session: 0 min  Stress: No Stress Concern Present (12/03/2023)   [employer] of Occupational Health - Occupational Stress Questionnaire    Feeling of Stress: Not at all  Social Connections: Moderately Isolated (12/03/2023)   Social Connection and Isolation Panel    Frequency of Communication with Friends and Family: More than three times a week    Frequency of Social Gatherings with Friends and Family: More than three times a week    Attends Religious Services: 1 to 4 times per year    Active Member of [employer] or Organizations: No    Attends Banker Meetings: Never    Marital Status: Widowed  Intimate Partner Violence: Not At Risk (12/03/2023)   Epic    Fear of Current or Ex-Partner: No    Emotionally Abused: No    Physically Abused: No    Sexually Abused: No  Depression (PHQ2-9): Low Risk (03/28/2024)   Depression (PHQ2-9)    PHQ-2 Score: 0  Alcohol Screen: Low Risk (12/03/2023)   Alcohol Screen    Last Alcohol Screening Score (AUDIT): 0  Housing: Unknown (12/03/2023)   Epic    Unable to Pay for Housing in the Last Year: No    Number of Times Moved in the Last Year: Not on file    Homeless in the Last Year: No  Utilities: Not At Risk (12/03/2023)   Epic    Threatened with loss of utilities: No  Health Literacy: Adequate Health Literacy (12/03/2023)   B1300 Health Literacy    Frequency of need for help with medical instructions: Never   Family History  Problem Relation Age of Onset   Coronary artery disease Father 72   Hyperlipidemia Father     Heart attack Mother 8   Hyperlipidemia Mother    Osteoporosis Sister    Hyperlipidemia Sister    Heart attack Daughter 28       CABG x 5   Heart disease Son 40       Stents x 3    Objective: Office vital signs reviewed. BP (!) 168/96 Comment: manual BP  Pulse (!) 158   Temp 98.7 F (37.1 C) (Temporal)   Ht 5' (1.524 m)   Wt 177 lb (80.3 kg)   SpO2 96%   BMI 34.57 kg/m   Physical Examination:  General: Awake, alert, well nourished, No acute distress HEENT:  sclera white, MMM Cardio tachycardic. Rhythm difficult to discern due to rate. Pulm:  normal work of breathing on room air Extremities: warm, persistent lower extremity edema that is improved compared to check last week.  Assessment/ Plan: 84 y.o. female   Bilateral leg edema - Plan: Basic Metabolic Panel  Essential hypertension - Plan: Basic Metabolic Panel  Tachycardia - Plan: EKG 12-Lead, Magnesium   Paroxysmal atrial fibrillation (HCC) - Plan: Basic Metabolic Panel, EKG 12-Lead, Magnesium , digoxin  (LANOXIN ) 0.125 MG tablet, metoprolol  tartrate (LOPRESSOR ) 100 MG tablet   Edema improved.  Blood pressure persistently elevated and she is noted to be tachycardic here in office.  EKG obtained which shows atrial fibrillation with rapid rate.  This is possibly due to missing her meds so I went ahead and refilled this.  I instructed her to take 2 of the digoxin  today.  Make sure that she has resumed metoprolol .  Monitor blood pressure and heart rate and if does not go back into a normal rate in the next 6 hours she is to go immediately to the ER.  If she has in fact taken her medicine (patient has dementia so daughter going home to check), her daughter is to bring her immediately to the ER if she is going to need a cardioversion of some sort  I have CCed her cardiologist to see if there are any other instructions he would like me to proceed with.  I will be glad to call down to the ER if she in fact does need to go to see if  they may be able to expedite her care.  She otherwise is asymptomatic   Norene CHRISTELLA Fielding, DO [facility] [phone]     [1]  Allergies Allergen Reactions   Fish Allergy Hives, Shortness Of Breath and Other (See Comments)    Bottom-feeder fish   Fish Protein-Containing Drug Products Hives, Shortness Of Breath and Other (See Comments)    Bottom-feeder fish   Shellfish Protein-Containing Drug Products Hives and Shortness Of Breath   Lasix  [Furosemide ] Swelling and Other (See Comments)    Edema, elevated BP   Codeine Rash   Latex Itching and Rash    Reports Elastic in underwear, under wire bras, and now surgical cap cause rash and itching.    Macrodantin [Nitrofurantoin Macrocrystal] Other (See Comments)    Unknown reaction    Niacin Other (See Comments)    Muscle aches   Niaspan [Niacin Er (Antihyperlipidemic)] Other (See Comments)    Muscle aches   Statins Other (See Comments)    Muscle aches/ cramps   Sulfa Antibiotics Rash and Other (See Comments)    Muscle aches/ cramps, also   Vicodin [Hydrocodone -Acetaminophen ] Nausea And Vomiting and Other (See Comments)    SEVERE N/V   Zetia  [Ezetimibe ] Other (See Comments)    Muscle aches  [2]  Current Outpatient Medications:    acetaminophen  (TYLENOL ) 500 MG tablet, Take 2 tablets (1,000 mg total) by mouth every 8 (eight) hours., Disp: 30 tablet, Rfl: 0   cholecalciferol  (VITAMIN D ) 1000 UNITS tablet, Take 1,000 Units by mouth daily after breakfast. , Disp: , Rfl:    Co-Enzyme Q10 200 MG CAPS, Take 200 mg by mouth daily after breakfast. , Disp: , Rfl:    digoxin  (LANOXIN ) 0.125 MG tablet, TAKE ONE TABLET BY MOUTH DAILY, Disp: 90 tablet, Rfl: 3   donepezil  (ARICEPT ) 10 MG tablet, Take 1 tablet (10 mg total) by mouth daily., Disp: 30 tablet, Rfl: 3  ELIQUIS  5 MG TABS tablet, Take 1 tablet (5 mg total) by mouth 2 (two) times daily., Disp: 180 tablet, Rfl: 1   furosemide  (LASIX ) 40 MG tablet, TAKE ONE  TABLET BY MOUTH EVERY DAY, Disp: 90 tablet, Rfl: 2   GNP VITAMIN B-12 500 MCG tablet, TAKE 1 TABLET DAILY, Disp: 90 tablet, Rfl: 0   hydrochlorothiazide  (MICROZIDE ) 12.5 MG capsule, Take 1 capsule (12.5 mg total) by mouth daily. For swelling and BP, Disp: 90 capsule, Rfl: 0   hydrocortisone  1 % lotion, Apply 1 application. topically 2 (two) times daily., Disp: 118 mL, Rfl: 0   levothyroxine  (SYNTHROID ) 50 MCG tablet, Take 1 tablet (50 mcg total) by mouth daily., Disp: 100 tablet, Rfl: 2   losartan  (COZAAR ) 25 MG tablet, Take 1 tablet (25 mg total) by mouth daily., Disp: 90 tablet, Rfl: 3   Magnesium  300 MG CAPS, Take 300 mg by mouth at bedtime. , Disp: , Rfl:    metoprolol  tartrate (LOPRESSOR ) 100 MG tablet, Take 1 tablet (100 mg total) by mouth 2 (two) times daily., Disp: 180 tablet, Rfl: 3   nitroGLYCERIN  (NITROSTAT ) 0.4 MG SL tablet, PLACE 1 TABLET UNDER THE TONGUE AT ONSET OF CHEST PAIN EVERY 5 MINTUES UP TO 3 TIMES AS NEEDED, Disp: 25 tablet, Rfl: 0   potassium chloride  SA (KLOR-CON  M) 20 MEQ tablet, Take 1 tablet (20 mEq total) by mouth daily., Disp: 30 tablet, Rfl: 1   REPATHA  SURECLICK 140 MG/ML SOAJ, INJECT 140mg  (1ml) EVERY TWO WEEKS AS DIRECTED, Disp: 2 mL, Rfl: 11   triamcinolone  (KENALOG ) 0.025 % ointment, Apply 1 Application topically 2 (two) times daily., Disp: 30 g, Rfl: 0   TURMERIC PO, Take 1 capsule by mouth daily after breakfast., Disp: , Rfl:  No current facility-administered medications for this visit.  Facility-Administered Medications Ordered in Other Visits:    DOBUTamine  (DOBUTREX ) 1,000 mcg/mL in dextrose  5% 250 mL infusion, 20 mcg/kg/min, Intravenous, Continuous, Maranda Leim DEL, MD, Last Rate: 96.4 mL/hr at 12/24/15 0840, 20 mcg/kg/min at 12/24/15 0840  "

## 2024-04-07 NOTE — Patient Instructions (Signed)
 Please make sure that she is taking all of her medications as prescribed.  I went ahead and renewed her metoprolol  as it appears to have not been refilled since December of last year.  She should also be on the HCTZ I just started, the losartan  and of course the digoxin .  I have reached out to your cardiologist to see if he has any other recommendations

## 2024-04-08 ENCOUNTER — Encounter: Payer: Self-pay | Admitting: Family Medicine

## 2024-04-08 LAB — BASIC METABOLIC PANEL WITH GFR
BUN/Creatinine Ratio: 16 (ref 12–28)
BUN: 15 mg/dL (ref 8–27)
CO2: 18 mmol/L — ABNORMAL LOW (ref 20–29)
Calcium: 9.5 mg/dL (ref 8.7–10.3)
Chloride: 105 mmol/L (ref 96–106)
Creatinine, Ser: 0.92 mg/dL (ref 0.57–1.00)
Glucose: 164 mg/dL — ABNORMAL HIGH (ref 70–99)
Potassium: 3.8 mmol/L (ref 3.5–5.2)
Sodium: 142 mmol/L (ref 134–144)
eGFR: 61 mL/min/{1.73_m2}

## 2024-04-08 LAB — MAGNESIUM: Magnesium: 2.2 mg/dL (ref 1.6–2.3)

## 2024-07-18 ENCOUNTER — Ambulatory Visit (HOSPITAL_COMMUNITY)

## 2024-08-02 ENCOUNTER — Ambulatory Visit: Admitting: Internal Medicine

## 2024-12-05 ENCOUNTER — Ambulatory Visit: Payer: Self-pay
